# Patient Record
Sex: Female | Born: 1955 | Race: White | Hispanic: No | Smoking: Never smoker
Health system: Southern US, Community
[De-identification: ages and names within clinical notes are randomized; demographics above are authoritative.]

## PROBLEM LIST (undated history)

## (undated) DIAGNOSIS — I10 Essential (primary) hypertension: Secondary | ICD-10-CM

## (undated) DIAGNOSIS — F419 Anxiety disorder, unspecified: Secondary | ICD-10-CM

## (undated) DIAGNOSIS — I639 Cerebral infarction, unspecified: Secondary | ICD-10-CM

## (undated) DIAGNOSIS — J45909 Unspecified asthma, uncomplicated: Secondary | ICD-10-CM

## (undated) DIAGNOSIS — K219 Gastro-esophageal reflux disease without esophagitis: Secondary | ICD-10-CM

## (undated) DIAGNOSIS — E785 Hyperlipidemia, unspecified: Secondary | ICD-10-CM

## (undated) HISTORY — DX: Cerebral infarction, unspecified: I63.9

## (undated) HISTORY — DX: Gastro-esophageal reflux disease without esophagitis: K21.9

## (undated) HISTORY — DX: Hyperlipidemia, unspecified: E78.5

## (undated) HISTORY — DX: Essential (primary) hypertension: I10

## (undated) HISTORY — PX: TONSILLECTOMY AND ADENOIDECTOMY: SUR1326

---

## 2000-03-09 ENCOUNTER — Ambulatory Visit (HOSPITAL_COMMUNITY): Admission: RE | Admit: 2000-03-09 | Discharge: 2000-03-09 | Payer: Self-pay | Admitting: Gastroenterology

## 2000-03-09 ENCOUNTER — Encounter: Payer: Self-pay | Admitting: Gastroenterology

## 2003-11-21 ENCOUNTER — Ambulatory Visit: Payer: Self-pay | Admitting: Internal Medicine

## 2004-01-05 ENCOUNTER — Emergency Department: Payer: Self-pay | Admitting: Emergency Medicine

## 2004-01-23 ENCOUNTER — Ambulatory Visit: Payer: Self-pay | Admitting: Family Medicine

## 2004-08-01 ENCOUNTER — Ambulatory Visit: Payer: Self-pay | Admitting: Internal Medicine

## 2004-09-09 ENCOUNTER — Ambulatory Visit: Payer: Self-pay | Admitting: Family Medicine

## 2004-09-20 ENCOUNTER — Emergency Department: Payer: Self-pay | Admitting: Unknown Physician Specialty

## 2004-12-18 ENCOUNTER — Ambulatory Visit: Payer: Self-pay | Admitting: Internal Medicine

## 2005-04-01 ENCOUNTER — Emergency Department: Payer: Self-pay | Admitting: Emergency Medicine

## 2005-04-01 ENCOUNTER — Ambulatory Visit: Payer: Self-pay | Admitting: Internal Medicine

## 2005-04-01 ENCOUNTER — Other Ambulatory Visit: Payer: Self-pay

## 2005-04-09 ENCOUNTER — Ambulatory Visit: Payer: Self-pay | Admitting: General Surgery

## 2005-04-22 ENCOUNTER — Ambulatory Visit: Payer: Self-pay | Admitting: General Surgery

## 2005-04-30 ENCOUNTER — Ambulatory Visit: Payer: Self-pay | Admitting: Internal Medicine

## 2005-05-06 ENCOUNTER — Encounter: Payer: Self-pay | Admitting: Internal Medicine

## 2005-05-06 ENCOUNTER — Ambulatory Visit: Payer: Self-pay | Admitting: Gastroenterology

## 2005-05-10 ENCOUNTER — Ambulatory Visit: Payer: Self-pay | Admitting: Internal Medicine

## 2005-05-16 HISTORY — PX: ESOPHAGOGASTRODUODENOSCOPY: SHX1529

## 2005-05-17 ENCOUNTER — Ambulatory Visit: Payer: Self-pay | Admitting: Gastroenterology

## 2005-06-15 ENCOUNTER — Ambulatory Visit: Payer: Self-pay | Admitting: Gastroenterology

## 2005-08-27 ENCOUNTER — Ambulatory Visit: Payer: Self-pay | Admitting: Family Medicine

## 2005-09-27 ENCOUNTER — Ambulatory Visit: Payer: Self-pay | Admitting: Family Medicine

## 2006-03-10 ENCOUNTER — Ambulatory Visit: Payer: Self-pay | Admitting: Internal Medicine

## 2006-03-18 DIAGNOSIS — I639 Cerebral infarction, unspecified: Secondary | ICD-10-CM

## 2006-03-18 HISTORY — DX: Cerebral infarction, unspecified: I63.9

## 2006-03-30 ENCOUNTER — Ambulatory Visit: Payer: Self-pay | Admitting: Internal Medicine

## 2006-04-05 ENCOUNTER — Ambulatory Visit: Payer: Self-pay | Admitting: Ophthalmology

## 2006-04-06 ENCOUNTER — Ambulatory Visit: Payer: Self-pay | Admitting: Internal Medicine

## 2006-04-07 ENCOUNTER — Encounter: Payer: Self-pay | Admitting: Cardiology

## 2006-04-07 ENCOUNTER — Ambulatory Visit: Payer: Self-pay

## 2006-04-11 ENCOUNTER — Ambulatory Visit: Payer: Self-pay | Admitting: Internal Medicine

## 2006-05-18 ENCOUNTER — Ambulatory Visit: Payer: Self-pay | Admitting: Internal Medicine

## 2006-05-26 ENCOUNTER — Ambulatory Visit: Payer: Self-pay | Admitting: Internal Medicine

## 2006-06-17 DIAGNOSIS — K219 Gastro-esophageal reflux disease without esophagitis: Secondary | ICD-10-CM

## 2006-06-17 DIAGNOSIS — E7800 Pure hypercholesterolemia, unspecified: Secondary | ICD-10-CM | POA: Insufficient documentation

## 2006-06-17 DIAGNOSIS — E1165 Type 2 diabetes mellitus with hyperglycemia: Secondary | ICD-10-CM

## 2006-06-30 ENCOUNTER — Encounter (INDEPENDENT_AMBULATORY_CARE_PROVIDER_SITE_OTHER): Payer: Self-pay | Admitting: *Deleted

## 2006-08-10 ENCOUNTER — Encounter (INDEPENDENT_AMBULATORY_CARE_PROVIDER_SITE_OTHER): Payer: Self-pay | Admitting: *Deleted

## 2006-08-10 ENCOUNTER — Ambulatory Visit: Payer: Self-pay | Admitting: Internal Medicine

## 2006-08-10 DIAGNOSIS — I10 Essential (primary) hypertension: Secondary | ICD-10-CM | POA: Insufficient documentation

## 2006-08-11 ENCOUNTER — Encounter: Payer: Self-pay | Admitting: Internal Medicine

## 2006-08-22 ENCOUNTER — Ambulatory Visit: Payer: Self-pay

## 2006-08-26 ENCOUNTER — Encounter: Payer: Self-pay | Admitting: Internal Medicine

## 2006-08-26 ENCOUNTER — Ambulatory Visit: Payer: Self-pay

## 2006-08-31 ENCOUNTER — Encounter: Payer: Self-pay | Admitting: Internal Medicine

## 2006-11-15 ENCOUNTER — Telehealth (INDEPENDENT_AMBULATORY_CARE_PROVIDER_SITE_OTHER): Payer: Self-pay | Admitting: *Deleted

## 2006-11-16 ENCOUNTER — Ambulatory Visit: Payer: Self-pay | Admitting: Internal Medicine

## 2006-12-01 ENCOUNTER — Ambulatory Visit: Payer: Self-pay | Admitting: Internal Medicine

## 2006-12-01 LAB — CONVERTED CEMR LAB
Bilirubin Urine: NEGATIVE
Blood in Urine, dipstick: NEGATIVE
Glucose, Urine, Semiquant: NEGATIVE
Ketones, urine, test strip: NEGATIVE
Nitrite: NEGATIVE
Protein, U semiquant: NEGATIVE
Specific Gravity, Urine: 1.015
Urobilinogen, UA: 0.2
WBC Urine, dipstick: NEGATIVE
pH: 5

## 2006-12-23 ENCOUNTER — Encounter (INDEPENDENT_AMBULATORY_CARE_PROVIDER_SITE_OTHER): Payer: Self-pay | Admitting: *Deleted

## 2007-01-02 ENCOUNTER — Ambulatory Visit: Payer: Self-pay | Admitting: Family Medicine

## 2007-01-03 ENCOUNTER — Encounter: Payer: Self-pay | Admitting: Internal Medicine

## 2007-01-04 ENCOUNTER — Emergency Department: Payer: Self-pay | Admitting: Emergency Medicine

## 2007-01-04 ENCOUNTER — Other Ambulatory Visit: Payer: Self-pay

## 2007-01-10 ENCOUNTER — Encounter: Payer: Self-pay | Admitting: Internal Medicine

## 2007-01-13 ENCOUNTER — Telehealth (INDEPENDENT_AMBULATORY_CARE_PROVIDER_SITE_OTHER): Payer: Self-pay | Admitting: *Deleted

## 2007-01-16 ENCOUNTER — Ambulatory Visit: Payer: Self-pay

## 2007-01-18 ENCOUNTER — Encounter (INDEPENDENT_AMBULATORY_CARE_PROVIDER_SITE_OTHER): Payer: Self-pay | Admitting: Neurology

## 2007-01-18 ENCOUNTER — Ambulatory Visit: Payer: Self-pay

## 2007-01-23 ENCOUNTER — Ambulatory Visit: Payer: Self-pay | Admitting: Internal Medicine

## 2007-01-24 ENCOUNTER — Telehealth (INDEPENDENT_AMBULATORY_CARE_PROVIDER_SITE_OTHER): Payer: Self-pay | Admitting: *Deleted

## 2007-02-12 ENCOUNTER — Encounter: Admission: RE | Admit: 2007-02-12 | Discharge: 2007-02-12 | Payer: Self-pay | Admitting: Neurology

## 2007-02-27 ENCOUNTER — Encounter: Payer: Self-pay | Admitting: Internal Medicine

## 2007-03-03 ENCOUNTER — Encounter: Payer: Self-pay | Admitting: Cardiology

## 2007-03-03 ENCOUNTER — Ambulatory Visit (HOSPITAL_COMMUNITY): Admission: RE | Admit: 2007-03-03 | Discharge: 2007-03-03 | Payer: Self-pay | Admitting: Cardiology

## 2007-03-03 ENCOUNTER — Ambulatory Visit: Payer: Self-pay | Admitting: Cardiology

## 2007-03-04 ENCOUNTER — Encounter: Payer: Self-pay | Admitting: Internal Medicine

## 2007-03-24 ENCOUNTER — Ambulatory Visit: Payer: Self-pay | Admitting: Internal Medicine

## 2007-04-25 ENCOUNTER — Encounter (INDEPENDENT_AMBULATORY_CARE_PROVIDER_SITE_OTHER): Payer: Self-pay | Admitting: *Deleted

## 2007-04-25 ENCOUNTER — Telehealth (INDEPENDENT_AMBULATORY_CARE_PROVIDER_SITE_OTHER): Payer: Self-pay | Admitting: *Deleted

## 2007-06-08 ENCOUNTER — Encounter (INDEPENDENT_AMBULATORY_CARE_PROVIDER_SITE_OTHER): Payer: Self-pay | Admitting: *Deleted

## 2007-06-14 ENCOUNTER — Encounter (INDEPENDENT_AMBULATORY_CARE_PROVIDER_SITE_OTHER): Payer: Self-pay | Admitting: *Deleted

## 2007-06-15 ENCOUNTER — Ambulatory Visit: Payer: Self-pay | Admitting: Family Medicine

## 2007-06-30 ENCOUNTER — Encounter (INDEPENDENT_AMBULATORY_CARE_PROVIDER_SITE_OTHER): Payer: Self-pay | Admitting: *Deleted

## 2007-07-15 ENCOUNTER — Emergency Department: Payer: Self-pay | Admitting: Emergency Medicine

## 2007-07-17 ENCOUNTER — Ambulatory Visit: Payer: Self-pay | Admitting: Internal Medicine

## 2007-07-18 ENCOUNTER — Ambulatory Visit: Payer: Self-pay | Admitting: Internal Medicine

## 2007-07-21 ENCOUNTER — Ambulatory Visit: Payer: Self-pay | Admitting: Internal Medicine

## 2007-07-21 ENCOUNTER — Encounter (INDEPENDENT_AMBULATORY_CARE_PROVIDER_SITE_OTHER): Payer: Self-pay | Admitting: *Deleted

## 2007-07-25 ENCOUNTER — Telehealth: Payer: Self-pay | Admitting: Internal Medicine

## 2007-09-08 ENCOUNTER — Emergency Department: Payer: Self-pay | Admitting: Emergency Medicine

## 2007-09-15 ENCOUNTER — Ambulatory Visit: Payer: Self-pay | Admitting: Internal Medicine

## 2007-09-15 DIAGNOSIS — F39 Unspecified mood [affective] disorder: Secondary | ICD-10-CM | POA: Insufficient documentation

## 2007-09-20 ENCOUNTER — Encounter: Payer: Self-pay | Admitting: Internal Medicine

## 2007-09-20 ENCOUNTER — Ambulatory Visit: Payer: Self-pay | Admitting: Specialist

## 2007-09-22 ENCOUNTER — Encounter: Payer: Self-pay | Admitting: Internal Medicine

## 2008-04-19 ENCOUNTER — Emergency Department: Payer: Self-pay | Admitting: Emergency Medicine

## 2008-04-19 ENCOUNTER — Encounter (INDEPENDENT_AMBULATORY_CARE_PROVIDER_SITE_OTHER): Payer: Self-pay | Admitting: Internal Medicine

## 2008-04-25 ENCOUNTER — Encounter (INDEPENDENT_AMBULATORY_CARE_PROVIDER_SITE_OTHER): Payer: Self-pay | Admitting: Internal Medicine

## 2008-04-25 ENCOUNTER — Ambulatory Visit: Payer: Self-pay | Admitting: Family Medicine

## 2008-04-25 DIAGNOSIS — F4321 Adjustment disorder with depressed mood: Secondary | ICD-10-CM

## 2008-04-25 LAB — CONVERTED CEMR LAB
Bilirubin Urine: NEGATIVE
Blood in Urine, dipstick: NEGATIVE
Glucose, Urine, Semiquant: NEGATIVE
Ketones, urine, test strip: NEGATIVE
Nitrite: NEGATIVE
Protein, U semiquant: NEGATIVE
Specific Gravity, Urine: 1.01
Urobilinogen, UA: 0.2
WBC Urine, dipstick: NEGATIVE
pH: 6

## 2008-05-05 ENCOUNTER — Encounter (INDEPENDENT_AMBULATORY_CARE_PROVIDER_SITE_OTHER): Payer: Self-pay | Admitting: Internal Medicine

## 2008-05-05 LAB — CONVERTED CEMR LAB
Bacteria, UA: 0
RBC / HPF: 0

## 2008-07-05 ENCOUNTER — Telehealth: Payer: Self-pay | Admitting: Family Medicine

## 2008-07-29 ENCOUNTER — Encounter: Payer: Self-pay | Admitting: Internal Medicine

## 2008-08-15 ENCOUNTER — Ambulatory Visit: Payer: Self-pay | Admitting: Family Medicine

## 2008-08-15 DIAGNOSIS — M76899 Other specified enthesopathies of unspecified lower limb, excluding foot: Secondary | ICD-10-CM

## 2008-09-10 ENCOUNTER — Ambulatory Visit: Payer: Self-pay | Admitting: Internal Medicine

## 2008-09-10 DIAGNOSIS — N951 Menopausal and female climacteric states: Secondary | ICD-10-CM | POA: Insufficient documentation

## 2008-09-26 ENCOUNTER — Ambulatory Visit: Payer: Self-pay | Admitting: Family Medicine

## 2008-10-10 ENCOUNTER — Ambulatory Visit: Payer: Self-pay | Admitting: Family Medicine

## 2008-11-01 ENCOUNTER — Emergency Department: Payer: Self-pay | Admitting: Emergency Medicine

## 2008-11-01 ENCOUNTER — Telehealth: Payer: Self-pay | Admitting: Internal Medicine

## 2008-11-18 ENCOUNTER — Ambulatory Visit: Payer: Self-pay | Admitting: Internal Medicine

## 2008-12-30 ENCOUNTER — Telehealth: Payer: Self-pay | Admitting: Internal Medicine

## 2009-01-07 ENCOUNTER — Telehealth: Payer: Self-pay | Admitting: Internal Medicine

## 2009-08-02 ENCOUNTER — Encounter: Payer: Self-pay | Admitting: Internal Medicine

## 2010-02-04 ENCOUNTER — Encounter: Payer: Self-pay | Admitting: Internal Medicine

## 2010-02-05 ENCOUNTER — Ambulatory Visit: Payer: Self-pay | Admitting: Internal Medicine

## 2010-02-05 DIAGNOSIS — N3000 Acute cystitis without hematuria: Secondary | ICD-10-CM | POA: Insufficient documentation

## 2010-02-05 LAB — CONVERTED CEMR LAB
Glucose, Urine, Semiquant: NEGATIVE
Nitrite: NEGATIVE
Protein, U semiquant: NEGATIVE
WBC Urine, dipstick: NEGATIVE
pH: 6.5

## 2010-03-17 NOTE — Letter (Signed)
Summary: [facility]  [facility]   Imported By: Lanelle Bal 08/20/2009 11:37:09  _____________________________________________________________________  External Attachment:    Type:   Image     Comment:   External Document  Appended Document: [facility] seen for wrist and elbow pain felt to be acute inflammatory process trying sterapred pack

## 2010-03-19 NOTE — Assessment & Plan Note (Signed)
Summary: 1:45 ?UTI/CLE   Vital Signs:  Patient profile:   55 year old female Height:      63 inches Weight:      242 pounds BMI:     43.02 Temp:     98.4 degrees F oral BP sitting:   158 / 90  (right arm) Cuff size:   large  Vitals Entered By: Mervin Hack CMA Duncan Dull) (February 05, 2010 1:54 PM) CC: ? UTI   History of Present Illness: Has had a lot of stress Husband has been in and out of the hospital a lot hasn't been able to get in due to caring for him  Now having some pain and burning in left upper back--and around to flank soreness over past couple of days No dysuria but has increased frequency No hematuria No fever but doesn't feel good. SOme cold sweat Mild abd cramp but not major  Diabetes has been okay checks about twice a week fasting. usually 120-130 No hypoglycemic reactions but can tell if it is a little low if she has missed a meal Overdue for eye appt  SOme focusing problems--relates to strokes Occ headaches---usually left parietal--more common if tired No chest pain No SOB  Ongoing mood issues medication wasn't helping off sertraline for 5-6 months  Allergies: 1)  ! Tramadol Hcl (Tramadol Hcl) 2)  Augmentin 3)  Singulair (Montelukast Sodium) 4)  Doxycycline Hyclate (Doxycycline Hyclate) 5)  Macrobid (Nitrofurantoin Monohyd Macro)  Past History:  Past medical, surgical, family and social histories (including risk factors) reviewed for relevance to current acute and chronic problems.  Past Medical History: Reviewed history from 03/24/2007 and no changes required. Diabetes mellitus, type II-------Dr [facility] E[facility]   [id number] GERD Hyperlipidemia 2/08  CVA--R occipital----Dr Thad Ranger HTN   CONSULTANTS Dr Luella Cook [phone]  Past Surgical History: Reviewed history from 06/17/2006 and no changes required. C-section x 1 T & A EGD- GERD 04/07  Family History: Reviewed history and no changes required.  Social History: Reviewed  history from 06/17/2006 and no changes required. 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   Neck:  supple, no masses, no thyromegaly, and no cervical lymphadenopathy.   Lungs:  normal respiratory effort, no intercostal retractions, no accessory muscle use, and normal breath sounds.   Heart:  normal rate, regular rhythm, no murmur, and no gallop.   Abdomen:  soft.  Mild tenderness around LMQ Skin:  no suspicious lesions and no ulcerations.   Psych:  normally interactive, good eye contact, and dysphoric affect.    Diabetes Management Exam:    Foot Exam (with socks and/or shoes not present):       Sensory-Pinprick/Light touch:          Left medial foot (L-4): diminished          Left dorsal foot (L-5): diminished          Left lateral foot (S-1): diminished          Right medial foot (L-4): normal          Right dorsal foot (L-5): normal          Right lateral foot (S-1): normal       Inspection:  Left foot: normal          Right foot: normal       Nails:          Left foot: normal          Right foot: normal   Impression & Recommendations:  Problem # 1:  ACUTE CYSTITIS (ICD-595.0) Assessment New  not clear cut will try cipro for 3 days  Her updated medication list for this problem includes:    Ciprofloxacin Hcl 250 Mg Tabs (Ciprofloxacin hcl) .Marland Kitchen... 1 tab by mouth two times a day for bladder infection  Orders: UA Dipstick w/o Micro (manual) ([address])  Problem # 2:  DEPRESSION, SITUATIONAL (ICD-309.0) Assessment: Deteriorated ongoing didn't get help from sertraline will try fluoxetine again---helped but gained weight  Problem # 3:  DIABETES MELLITUS, TYPE II (ICD-250.00) Assessment:  Unchanged  seems like she has good control will check labs   Her updated medication list for this problem includes:    Lisinopril-hydrochlorothiazide 20-25 Mg Tabs (Lisinopril-hydrochlorothiazide) .Marland Kitchen... Take 1 tablet by mouth once a day    Glipizide Xl 5 Mg Tb24 (Glipizide) .Marland Kitchen... 1 daily    Aspirin 81 Mg Tbec (Aspirin) .Marland Kitchen... Take one by mouth daily  Orders: TLB-A1C / Hgb A1C (Glycohemoglobin) (83036-A1C) TLB-TSH (Thyroid Stimulating Hormone) (84443-TSH) Venipuncture ([address])  Problem # 4:  ESSENTIAL HYPERTENSION (ICD-401.9) Assessment: Unchanged  up some today but doesn't feel well  Her updated medication list for this problem includes:    Toprol Xl 50 Mg Tb24 (Metoprolol succinate) .Marland Kitchen... Take 1 tablet by mouth once a day    Lisinopril-hydrochlorothiazide 20-25 Mg Tabs (Lisinopril-hydrochlorothiazide) .Marland Kitchen... Take 1 tablet by mouth once a day  BP today: 158/90 Prior BP: 120/80 (10/10/2008)  Orders: TLB-Renal Function Panel (80069-RENAL) TLB-CBC Platelet - w/Differential (85025-CBCD)  Complete Medication List: 1)  Toprol Xl 50 Mg Tb24 (Metoprolol succinate) .... Take 1 tablet by mouth once a day 2)  Pravastatin Sodium 40 Mg Tabs (Pravastatin sodium) .... Take 1 tablet by mouth at bedtime 3)  Lisinopril-hydrochlorothiazide 20-25 Mg Tabs (Lisinopril-hydrochlorothiazide) .... Take 1 tablet by mouth once a day 4)  Xopenex Hfa 45 Mcg/act Aero (Levalbuterol tartrate) .... 2 puffs every 4 hours as needed wheezing or chest tightness. 5)  Glipizide Xl 5 Mg Tb24 (Glipizide) .Marland Kitchen.. 1 daily 6)  Aspirin 81 Mg Tbec (Aspirin) .... Take one by mouth daily 7)  Ciprofloxacin Hcl 250 Mg Tabs (Ciprofloxacin hcl) .Marland Kitchen.. 1 tab by mouth two times a day for bladder infection 8)  Fluoxetine Hcl 10 Mg Caps (Fluoxetine hcl) .Marland Kitchen.. 1 cap by mouth daily to help mood and energy  Other Orders: TLB-Hepatic/Liver Function Pnl (80076-HEPATIC) TLB-Lipid Panel (80061-LIPID)  Patient Instructions: 1)  Please  schedule a follow-up appointment in 3 months for physical Prescriptions: GLIPIZIDE XL 5 MG  TB24 (GLIPIZIDE) 1 daily  #90 x 1   Entered and Authorized by:   Cindee Salt MD   Signed by:   Cindee Salt MD on 02/05/2010   Method used:   Electronically to        [facility] [address] (retail)       [address], [address]       [place], [place]  [address]       Ph: [phone]       Fax: [phone]   RxID:   [id number] LISINOPRIL-HYDROCHLOROTHIAZIDE 20-25 MG TABS (LISINOPRIL-HYDROCHLOROTHIAZIDE) Take 1 tablet by mouth once a day  #90 x  1   Entered and Authorized by:   Cindee Salt MD   Signed by:   Cindee Salt MD on 02/05/2010   Method used:   Electronically to        [facility] [address] (retail)       [address], [address]       [place], [place]  [address]       Ph: [phone]       Fax: [phone]   RxID:   [id number] PRAVASTATIN SODIUM 40 MG TABS (PRAVASTATIN SODIUM) Take 1 tablet by mouth at bedtime  #90 x 1   Entered and Authorized by:   Cindee Salt MD   Signed by:   Cindee Salt MD on 02/05/2010   Method used:   Electronically to        [facility] [address] (retail)       [address], [address]       [place], [place]  [address]       Ph: [phone]       Fax: [phone]   RxID:   [id number] TOPROL XL 50 MG TB24 (METOPROLOL SUCCINATE) Take 1 tablet by mouth once a day  #90 x 1   Entered and Authorized by:   Cindee Salt MD   Signed by:   Cindee Salt MD on 02/05/2010   Method used:   Electronically to        [facility] [address] (retail)       [address], [address]       [place], [place]  [address]       Ph: [phone]       Fax: [phone]   RxID:   [id number] FLUOXETINE HCL 10 MG CAPS (FLUOXETINE HCL) 1 cap by mouth daily to help mood and energy  #90 x 1    Entered and Authorized by:   Cindee Salt MD   Signed by:   Cindee Salt MD on 02/05/2010   Method used:   Electronically to        [facility] [address] (retail)       [address], [address]       [place], [place]  [address]       Ph: [phone]       Fax: [phone]   RxID:   [id number] CIPROFLOXACIN HCL 250 MG TABS (CIPROFLOXACIN HCL) 1 tab by mouth two times a day for bladder infection  #14 x 0   Entered and Authorized by:   Cindee Salt MD   Signed by:   Cindee Salt MD on 02/05/2010   Method used:   Electronically to        [facility] [address] (retail)       [address], [address]       [place], [place]  [address]       Ph: [phone]       Fax: [phone]   RxID:   [id number]    Orders Added: 1)  Est. Patient Level IV [[address]] 2)  UA Dipstick w/o Micro (manual) [[address]1002] 3)  TLB-A1C / Hgb A1C (Glycohemoglobin) [83036-A1C] 4)  TLB-TSH (Thyroid Stimulating  Hormone) [84443-TSH] 5)  Venipuncture [36415] 6)  TLB-Renal Function Panel [80069-RENAL] 7)  TLB-CBC Platelet - w/Differential [85025-CBCD] 8)  TLB-Hepatic/Liver Function Pnl [80076-HEPATIC] 9)  TLB-Lipid Panel [80061-LIPID]    Current Allergies (reviewed today): ! TRAMADOL HCL (TRAMADOL HCL) AUGMENTIN SINGULAIR (MONTELUKAST SODIUM) DOXYCYCLINE HYCLATE (DOXYCYCLINE HYCLATE) MACROBID (NITROFURANTOIN MONOHYD MACRO)  Laboratory Results   Urine Tests  Date/Time Received: February 05, 2010 1:55 PM   Routine Urinalysis   Color: yellow Appearance: Clear Glucose: negative   (Normal Range: Negative) Bilirubin: negative   (Normal Range: Negative) Ketone: negative   (Normal Range: Negative) Spec. Gravity: 1.020   (Normal Range: 1.003-1.035) Blood: negative   (Normal Range: Negative) pH: 6.5   (Normal Range: 5.0-8.0) Protein: negative   (Normal Range: Negative) Urobilinogen: 0.2   (Normal Range: 0-1) Nitrite:  negative   (Normal Range: Negative) Leukocyte Esterace: negative   (Normal Range: Negative)

## 2010-03-21 ENCOUNTER — Encounter: Payer: Self-pay | Admitting: Internal Medicine

## 2010-04-06 ENCOUNTER — Encounter: Payer: Self-pay | Admitting: Family Medicine

## 2010-04-06 ENCOUNTER — Ambulatory Visit (INDEPENDENT_AMBULATORY_CARE_PROVIDER_SITE_OTHER)
Admission: RE | Admit: 2010-04-06 | Discharge: 2010-04-06 | Disposition: A | Discharge status: Home / Self Care | Payer: 59 | Source: Ambulatory Visit | Attending: Family Medicine | Admitting: Family Medicine

## 2010-04-06 ENCOUNTER — Other Ambulatory Visit: Payer: Self-pay | Admitting: Family Medicine

## 2010-04-06 ENCOUNTER — Ambulatory Visit (INDEPENDENT_AMBULATORY_CARE_PROVIDER_SITE_OTHER): Payer: 59 | Admitting: Family Medicine

## 2010-04-06 DIAGNOSIS — M25539 Pain in unspecified wrist: Secondary | ICD-10-CM

## 2010-04-06 DIAGNOSIS — S63659A Sprain of metacarpophalangeal joint of unspecified finger, initial encounter: Secondary | ICD-10-CM | POA: Insufficient documentation

## 2010-04-14 ENCOUNTER — Encounter: Payer: Self-pay | Admitting: Family Medicine
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
## 2010-05-06 ENCOUNTER — Ambulatory Visit: Payer: 59 | Admitting: Family Medicine

## 2010-05-06 DIAGNOSIS — Z0289 Encounter for other administrative examinations: Secondary | ICD-10-CM

## 2010-05-07 ENCOUNTER — Other Ambulatory Visit (HOSPITAL_COMMUNITY)
Admission: RE | Admit: 2010-05-07 | Discharge: 2010-05-07 | Disposition: A | Discharge status: Home / Self Care | Payer: 59 | Source: Ambulatory Visit | Attending: Internal Medicine | Admitting: Internal Medicine

## 2010-05-07 DIAGNOSIS — Z01419 Encounter for gynecological examination (general) (routine) without abnormal findings: Secondary | ICD-10-CM | POA: Insufficient documentation

## 2010-05-08 ENCOUNTER — Encounter: Payer: Self-pay | Admitting: Internal Medicine

## 2010-05-08 ENCOUNTER — Encounter (INDEPENDENT_AMBULATORY_CARE_PROVIDER_SITE_OTHER): Payer: Self-pay | Admitting: *Deleted

## 2010-05-08 ENCOUNTER — Ambulatory Visit (INDEPENDENT_AMBULATORY_CARE_PROVIDER_SITE_OTHER): Payer: 59 | Admitting: Internal Medicine

## 2010-05-08 VITALS — BP 144/86 | HR 86 | Temp 98.5°F | Ht 63.0 in | Wt 243.0 lb

## 2010-05-08 DIAGNOSIS — N950 Postmenopausal bleeding: Secondary | ICD-10-CM | POA: Insufficient documentation

## 2010-05-08 DIAGNOSIS — E119 Type 2 diabetes mellitus without complications: Secondary | ICD-10-CM

## 2010-05-08 DIAGNOSIS — Z1211 Encounter for screening for malignant neoplasm of colon: Secondary | ICD-10-CM

## 2010-05-08 DIAGNOSIS — Z1231 Encounter for screening mammogram for malignant neoplasm of breast: Secondary | ICD-10-CM

## 2010-05-08 DIAGNOSIS — Z0000 Encounter for general adult medical examination without abnormal findings: Secondary | ICD-10-CM | POA: Insufficient documentation

## 2010-05-08 DIAGNOSIS — F4321 Adjustment disorder with depressed mood: Secondary | ICD-10-CM

## 2010-05-08 DIAGNOSIS — M1610 Unilateral primary osteoarthritis, unspecified hip: Secondary | ICD-10-CM | POA: Insufficient documentation

## 2010-05-08 MED ORDER — FLUOXETINE HCL 20 MG PO CAPS: 20.0000 mg | ORAL_CAPSULE | Freq: Every day | ORAL | Status: DC

## 2010-05-08 MED ORDER — HYDROCODONE-ACETAMINOPHEN 5-325 MG PO TABS: 1.0000 | ORAL_TABLET | Freq: Every evening | ORAL | Status: DC | PRN
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
## 2010-05-08 NOTE — Progress Notes (Signed)
Addended byMills Koller on: 05/08/2010 04:38 PM   Modules accepted: Orders

## 2010-05-08 NOTE — Patient Instructions (Addendum)
Please try ice on your left hip when it is hurting Please call if you have any more vaginal bleeding Please set up your mammogram and colonoscopy appointments  Please get labs to [facility] today HgbA1c and urine microal--250.00 Lipid and hepatic    272.4 CBC with diff, BMP, TSH  401.9

## 2010-05-13 ENCOUNTER — Encounter: Payer: Self-pay | Admitting: *Deleted

## 2010-05-14 NOTE — Letter (Signed)
Summary: Pre Visit Letter Revised  [facility]  [address] [place], [place] [address]   Phone: [phone]  Fax: [phone]        05/08/2010 MRN: [id number] [facility] [address] [place], [place]  [address]  [place]             Procedure Date:  06/24/2010 @ 8:30   Direct colon-Dr. Jarold Motto   Welcome to the Gastroenterology Division at [facility].    You are scheduled to see a nurse for your pre-procedure visit on 06/10/2010 at 8:00 on the 3rd floor at [facility], [address]  We ask that you try to arrive at our office 15 minutes prior to your appointment time to allow for check-in.  Please take a minute to review the attached form.  If you answer "Yes" to one or more of the questions on the first page, we ask that you call the person listed at your earliest opportunity.  If you answer "No" to all of the questions, please complete the rest of the form and bring it to your appointment.    Your nurse visit will consist of discussing your medical and surgical history, your immediate family medical history, and your medications.   If you are unable to list all of your medications on the form, please bring the medication bottles to your appointment and we will list them.  We will need to be aware of both prescribed and over the counter drugs.  We will need to know exact dosage information as well.    Please be prepared to read and sign documents such as consent forms, a financial agreement, and acknowledgement forms.  If necessary, and with your consent, a friend or relative is welcome to sit-in on the nurse visit with you.  Please bring your insurance card so that we may make a copy of it.  If your insurance requires a referral to see a specialist, please bring your referral form from your primary care physician.  No co-pay is required for this nurse visit.     If you cannot keep your appointment, please call [phone] to cancel or reschedule prior to  your appointment date.  This allows [place] the opportunity to schedule an appointment for another patient in need of care.    Thank you for choosing [facility] for your medical needs.  We appreciate the opportunity to care for you.  Please visit [place] at our website  to learn more about our practice.  Sincerely, The Gastroenterology Division

## 2010-05-15 ENCOUNTER — Telehealth: Payer: Self-pay | Admitting: *Deleted

## 2010-05-15 NOTE — Telephone Encounter (Signed)
labwork came back faxed to [place] from [facility], Per Dr.Letvak results are as follows:  blood work ok Diabetes control better with A1C down to 7.2% Chol better with total 232 and LDL or bad chol down to 153 Blood count, liver, kidney and thyroid all normal  results have been put on phone tree, copied and sent for scanning.  Tami Jones spoke with patient and advised results.

## 2010-06-01 ENCOUNTER — Ambulatory Visit: Payer: Self-pay | Admitting: Internal Medicine

## 2010-06-03 ENCOUNTER — Encounter: Payer: Self-pay | Admitting: *Deleted

## 2010-06-08 ENCOUNTER — Encounter: Payer: Self-pay | Admitting: Internal Medicine

## 2010-06-24 ENCOUNTER — Other Ambulatory Visit: Payer: 59 | Admitting: Gastroenterology

## 2010-07-02 ENCOUNTER — Telehealth: Payer: Self-pay | Admitting: *Deleted

## 2010-07-02 NOTE — Telephone Encounter (Signed)
Check with her tomorrow This is unusual since it tends to be activating I don't mind switching back to 2 - 10mg  but this is not as easy Okay to try at night though

## 2010-07-02 NOTE — Telephone Encounter (Signed)
Pt had been taking prozac 10 mg's, two at a time.  She picked up a new script and it was for 20 mg's.  She took one today and is extremely sleepy.  I advised pt that she probably has a different brand of generic

## 2010-07-02 NOTE — Telephone Encounter (Signed)
Note continued from below.  I advised pt to try taking this at night, it may take some time for her system to get used to it.  She will call back if she continues to have problems.

## 2010-07-03 NOTE — Telephone Encounter (Signed)
Spoke with patient and she will try it at night and call us back on Monday to let us know how it worked.

## 2010-07-17 ENCOUNTER — Ambulatory Visit: Payer: 59 | Admitting: Internal Medicine

## 2010-07-17 DIAGNOSIS — Z0289 Encounter for other administrative examinations: Secondary | ICD-10-CM

## 2010-08-12 ENCOUNTER — Encounter: Payer: Self-pay | Admitting: Family Medicine

## 2010-08-12 ENCOUNTER — Ambulatory Visit (INDEPENDENT_AMBULATORY_CARE_PROVIDER_SITE_OTHER): Payer: 59 | Admitting: Family Medicine

## 2010-08-12 VITALS — BP 130/82 | HR 70 | Temp 97.8°F | Ht 64.0 in | Wt 240.8 lb

## 2010-08-12 DIAGNOSIS — N10 Acute tubulo-interstitial nephritis: Secondary | ICD-10-CM | POA: Insufficient documentation

## 2010-08-12 DIAGNOSIS — R300 Dysuria: Secondary | ICD-10-CM

## 2010-08-12 LAB — POCT URINALYSIS DIPSTICK
Glucose, UA: NEGATIVE
Nitrite, UA: NEGATIVE
Spec Grav, UA: 1.02
Urobilinogen, UA: NEGATIVE

## 2010-08-12 MED ORDER — CIPROFLOXACIN HCL 500 MG PO TABS: 500.0000 mg | ORAL_TABLET | Freq: Two times a day (BID) | ORAL | Status: DC

## 2010-08-12 NOTE — Progress Notes (Signed)
Tami Jones, a 55 y.o. female presents today in the office for the following:    ? UTI  Pain in left kidney Going to bathroom more frequently Son got married in [place]  Pain in back for a couple of days Nausea  Patient presents with burning, urgency. No vaginal discharge or external irritation.  No STD exposure.  Patient Active Problem List  Diagnoses  . DIABETES MELLITUS, TYPE II  . HYPERCHOLESTEROLEMIA  . ANXIETY, SITUATIONAL  . DEPRESSION, SITUATIONAL  . ESSENTIAL HYPERTENSION  . GERD  . MENOPAUSAL SYNDROME  . TROCHANTERIC BURSITIS, LEFT  . ACUTE CYSTITIS  . Physical exam, annual  . Postmenopausal bleeding  . Arthritis, hip  . Pyelonephritis, acute   Past Medical History  Diagnosis Date  . Diabetes mellitus   . GERD (gastroesophageal reflux disease)   . Hyperlipidemia   . CVA (cerebral vascular accident) 03/2006    right occipital, Dr. Thad Ranger  . Hypertension    Past Surgical History  Procedure Date  . Cesarean section   . Tonsillectomy and adenoidectomy   . Esophagogastroduodenoscopy 05/2005   History  Substance Use Topics  . Smoking status: Never Smoker   . Smokeless tobacco: Never Used  . Alcohol Use: No   No family history on file. Allergies  Allergen Reactions  . Doxycycline Hyclate     REACTION: stomach upset  . HKV:[id number]+EPPIRJJOA+[id number] Acid+Aspartame     REACTION: diarrrhea and nausea  . Montelukast Sodium     REACTION: unspecified  . Nitrofurantoin     REACTION: unspecified  . Sulfa Antibiotics Nausea Only  . Tramadol Hcl     REACTION: Can not tolerate   Current Outpatient Prescriptions on File Prior to Visit  Medication Sig Dispense Refill  . aspirin 81 MG tablet Take 81 mg by mouth daily.        Marland Kitchen FLUoxetine (PROZAC) 20 MG capsule Take 1 capsule (20 mg total) by mouth daily.  30 capsule  3  . glipiZIDE (GLUCOTROL) 5 MG 24 hr tablet Take 5 mg by mouth daily.        Marland Kitchen HYDROcodone-acetaminophen (NORCO) 5-325 MG per  tablet Take 1-2 tablets by mouth at bedtime as needed for pain.  60 tablet  0  . levalbuterol (XOPENEX HFA) 45 MCG/ACT inhaler Inhale 1-2 puffs into the lungs every 4 (four) hours as needed.        Marland Kitchen lisinopril-hydrochlorothiazide (PRINZIDE,ZESTORETIC) 20-25 MG per tablet Take 1 tablet by mouth daily.        . metoprolol (TOPROL-XL) 50 MG 24 hr tablet Take 50 mg by mouth daily.        . pravastatin (PRAVACHOL) 40 MG tablet Take 40 mg by mouth at bedtime.           ROS: GEN:  no fevers, chills. GI: nausea, decreased appetite No cp or sob Otherwise, ROS is as per the HPI.  PHYSICAL EXAM  Blood pressure 130/82, pulse 70, temperature 97.8 F (36.6 C), temperature source Oral, height 5\' 4"  (1.626 m), weight 240 lb 12.8 oz (109.226 kg), SpO2 99.00%.  GEN: WDWN, A&Ox4,NAD. Non-toxic HEENT: Atraumatc, normocephalic. CV: RRR, No M/G/R PULM: CTA B, No wheezes, crackles, or rhonchi ABD: S, NT, ND, +BS, no rebound. + L CVAT. No suprapubic tenderness. EXT: No c/c/e  A/P: Pyelo, dysuria: clinical concern for early L pyelo. Cipro x 10 days Check urine culture

## 2010-08-13 ENCOUNTER — Telehealth: Payer: Self-pay | Admitting: *Deleted

## 2010-08-13 ENCOUNTER — Encounter: Payer: Self-pay | Admitting: *Deleted

## 2010-08-13 ENCOUNTER — Encounter: Payer: Self-pay | Admitting: Family Medicine

## 2010-08-13 ENCOUNTER — Ambulatory Visit (INDEPENDENT_AMBULATORY_CARE_PROVIDER_SITE_OTHER): Payer: 59 | Admitting: Family Medicine

## 2010-08-13 ENCOUNTER — Ambulatory Visit (INDEPENDENT_AMBULATORY_CARE_PROVIDER_SITE_OTHER)
Admission: RE | Admit: 2010-08-13 | Discharge: 2010-08-13 | Disposition: A | Discharge status: Home / Self Care | Payer: 59 | Source: Ambulatory Visit | Attending: Cardiology | Admitting: Cardiology

## 2010-08-13 DIAGNOSIS — N10 Acute tubulo-interstitial nephritis: Secondary | ICD-10-CM

## 2010-08-13 DIAGNOSIS — R1013 Epigastric pain: Secondary | ICD-10-CM | POA: Insufficient documentation

## 2010-08-13 LAB — HEPATIC FUNCTION PANEL
AST: 29 U/L (ref 0–37)
Total Bilirubin: 0.6 mg/dL (ref 0.3–1.2)

## 2010-08-13 LAB — BASIC METABOLIC PANEL
BUN: 16 mg/dL (ref 6–23)
Calcium: 9.3 mg/dL (ref 8.4–10.5)
Creatinine, Ser: 1.1 mg/dL (ref 0.4–1.2)
GFR: 57.49 mL/min — ABNORMAL LOW (ref >60.00)
Potassium: 4.2 mEq/L (ref 3.5–5.1)

## 2010-08-13 LAB — CBC WITH DIFFERENTIAL/PLATELET
Basophils Relative: 0.3 % (ref 0.0–3.0)
Eosinophils Absolute: 0 10*3/uL (ref 0.0–0.7)
MCHC: 34.5 g/dL (ref 30.0–36.0)
MCV: 91.9 fl (ref 78.0–100.0)
Monocytes Absolute: 0.4 10*3/uL (ref 0.1–1.0)
Neutrophils Relative %: 55.3 % (ref 43.0–77.0)
RBC: 4.25 Mil/uL (ref 3.87–5.11)

## 2010-08-13 LAB — LIPASE: Lipase: 36 U/L (ref 11.0–59.0)

## 2010-08-13 MED ORDER — IOHEXOL 300 MG/ML  SOLN
100.0000 mL | Freq: Once | INTRAMUSCULAR | Status: AC | PRN
  Administered 2010-08-13: 100 mL via INTRAVENOUS

## 2010-08-13 MED ORDER — CEFTRIAXONE SODIUM 250 MG IJ SOLR
1000.0000 mg | Freq: Once | INTRAMUSCULAR | Status: AC
  Administered 2010-08-13: 1000 mg via INTRAMUSCULAR

## 2010-08-13 NOTE — Telephone Encounter (Signed)
Patient advised.

## 2010-08-13 NOTE — Telephone Encounter (Signed)
Pt was seen yesterday for a UTI, she says she is worse today.  Taking cipro since yesterday, has had vomiting since last night.  Chills and sweating off and on, but no known fever , has a lot of abd pain, which she says is bad.  Asking what to do.  Uses [facility] [address].

## 2010-08-13 NOTE — Patient Instructions (Signed)
REFERRAL: GO THE THE FRONT ROOM AT THE ENTRANCE OF OUR CLINIC, NEAR CHECK IN. ASK FOR MARION. SHE WILL HELP YOU SET UP YOUR REFERRAL. DATE: TIME:  F/u tomorrow

## 2010-08-13 NOTE — Telephone Encounter (Signed)
Very worrisome, given acute worsening. Needs reevaluation. Have her come to office now for eval and I will work her in.

## 2010-08-13 NOTE — Progress Notes (Signed)
Tami Jones, a 55 y.o. female presents today in the office for the following:    F/u with acute worsening over the night until today. On Cipro 500 mg po bid for presumptive pyelo. Throwing up, pain in the stomache and in the abdomen.  The patient has felt subjectively feverish, but she is 98.7 today in the office.  Overnight, the patient worsened, and she is been feeling feverish, having chills, sweats, nausea and decreased appetite. She continues to have posterior flank and back pain on the left side as well as worsened abdominal pain, mostly in the epigastric region currently.  Relevant prior abdominal surgeries include cesarean section.  Patient Active Problem List  Diagnoses  . DIABETES MELLITUS, TYPE II  . HYPERCHOLESTEROLEMIA  . ANXIETY, SITUATIONAL  . DEPRESSION, SITUATIONAL  . ESSENTIAL HYPERTENSION  . GERD  . MENOPAUSAL SYNDROME  . Physical exam, annual  . Postmenopausal bleeding  . Arthritis, hip  . Pyelonephritis, acute  . Abdominal pain, epigastric   Past Medical History  Diagnosis Date  . Diabetes mellitus   . GERD (gastroesophageal reflux disease)   . Hyperlipidemia   . CVA (cerebral vascular accident) 03/2006    right occipital, Dr. Thad Ranger  . Hypertension    Past Surgical History  Procedure Date  . Cesarean section   . Tonsillectomy and adenoidectomy   . Esophagogastroduodenoscopy 05/2005   History  Substance Use Topics  . Smoking status: Never Smoker   . Smokeless tobacco: Never Used  . Alcohol Use: No   No family history on file. Allergies  Allergen Reactions  . Doxycycline Hyclate     REACTION: stomach upset  . Montelukast Sodium     REACTION: unspecified  . Nitrofurantoin     REACTION: unspecified  . Sulfa Antibiotics Nausea Only  . Tramadol Hcl     REACTION: Can not tolerate   Current Outpatient Prescriptions on File Prior to Visit  Medication Sig Dispense Refill  . aspirin 81 MG tablet Take 81 mg by mouth daily.        .  ciprofloxacin (CIPRO) 500 MG tablet Take 1 tablet (500 mg total) by mouth 2 (two) times daily.  20 tablet  0  . FLUoxetine (PROZAC) 20 MG capsule Take 1 capsule (20 mg total) by mouth daily.  30 capsule  3  . glipiZIDE (GLUCOTROL) 5 MG 24 hr tablet Take 5 mg by mouth daily.        Marland Kitchen HYDROcodone-acetaminophen (NORCO) 5-325 MG per tablet Take 1-2 tablets by mouth at bedtime as needed for pain.  60 tablet  0  . levalbuterol (XOPENEX HFA) 45 MCG/ACT inhaler Inhale 1-2 puffs into the lungs every 4 (four) hours as needed.        Marland Kitchen lisinopril-hydrochlorothiazide (PRINZIDE,ZESTORETIC) 20-25 MG per tablet Take 1 tablet by mouth daily.        . metoprolol (TOPROL-XL) 50 MG 24 hr tablet Take 50 mg by mouth daily.        . pravastatin (PRAVACHOL) 40 MG tablet Take 40 mg by mouth at bedtime.         No current facility-administered medications on file prior to visit.   Ros: As above: Chills, subjective fever, sweaty, no chest pain, no shortness of breath. Nausea. No vomiting. No diarrhea. Pain is described.   Physical Exam  Blood pressure 140/90, pulse 68, temperature 98.7 F (37.1 C), temperature source Oral, weight 241 lb 4 oz (109.43 kg).  GEN: WDWN, NAD, Non-toxic, A & O x 3, mildly sweaty  HEENT: Atraumatic, Normocephalic. Neck supple. No masses, No LAD. Ears and Nose: No external deformity. CV: RRR, No M/G/R. No JVD. No thrill. No extra heart sounds. PULM: CTA B, no wheezes, crackles, rhonchi. No retractions. No resp. distress. No accessory muscle use. ABD: Soft, nondistended, positive bowel sounds. Moderate epigastric tenderness. No rebound tenderness. No right lower quadrant and left lower quadrant tenderness. Positive left-sided costovertebral angle tenderness EXTR: No c/c/e NEURO Normal gait.  PSYCH: Normally interactive. Conversant. Not depressed or anxious appearing.  Calm demeanor.   Assessment and plan: Abdominal pain and flank pain. Urine culture remains pending. Positive UA for trace  blood and leukocyte esterase yesterday with urgency. CVA tenderness, and now progressive to abdominal pain.  Broaden differential diagnosis and obtain laboratories below, CBC with differential, comprehensive metabolic panel, lipase.  Potential resistance to Cipro, now we'll give Rocephin 1 g IM  Obtain a CT of the abdomen and pelvis with by mouth and IV contrast to evaluate for potential intra-abdominal pathology, diverticulitis, perforated abdomen, or other acute abdominal process.  If all of these are reassuring, and she is going to followup in the office tomorrow for recheck with her primary care provider. We discussed depending on results, may need to go to ER tonight.

## 2010-08-14 ENCOUNTER — Ambulatory Visit: Payer: 59 | Admitting: Internal Medicine

## 2010-08-14 ENCOUNTER — Other Ambulatory Visit: Payer: Self-pay | Admitting: *Deleted

## 2010-08-14 ENCOUNTER — Encounter: Payer: Self-pay | Admitting: Internal Medicine

## 2010-08-14 ENCOUNTER — Ambulatory Visit (INDEPENDENT_AMBULATORY_CARE_PROVIDER_SITE_OTHER): Payer: 59 | Admitting: Internal Medicine

## 2010-08-14 VITALS — BP 158/98 | HR 65 | Temp 98.0°F | Ht 63.0 in | Wt 240.0 lb

## 2010-08-14 DIAGNOSIS — N10 Acute tubulo-interstitial nephritis: Secondary | ICD-10-CM

## 2010-08-14 MED ORDER — HYDROCODONE-ACETAMINOPHEN 5-325 MG PO TABS: 1.0000 | ORAL_TABLET | Freq: Every evening | ORAL | Status: DC | PRN

## 2010-08-14 MED ORDER — CEFTRIAXONE SODIUM 1 G IJ SOLR
1.0000 g | Freq: Once | INTRAMUSCULAR | Status: AC
  Administered 2010-08-14: 1 g via INTRAMUSCULAR

## 2010-08-14 MED ORDER — CEFUROXIME AXETIL 250 MG PO TABS: 250.0000 mg | ORAL_TABLET | Freq: Two times a day (BID) | ORAL | Status: DC

## 2010-08-14 NOTE — Telephone Encounter (Signed)
rx sent to pharmacy by e-script , Spoke with patient and advised results   

## 2010-08-14 NOTE — Progress Notes (Signed)
Subjective:    Patient ID: Tami Jones, female    DOB: 08-07-1955, 55 y.o.   MRN: [id number]  HPI Not feeling better Having bad pain in left flank still Using hydrocodone --eases it "a little" Had sweats last night Fever still there but not bad  Voiding okay Does have sig frequency No bad dysuria No hematuria  Still with nausea Eating a little Able to take the cipro  Current Outpatient Prescriptions on File Prior to Visit  Medication Sig Dispense Refill  . aspirin 81 MG tablet Take 81 mg by mouth daily.        . ciprofloxacin (CIPRO) 500 MG tablet Take 1 tablet (500 mg total) by mouth 2 (two) times daily.  20 tablet  0  . FLUoxetine (PROZAC) 20 MG capsule Take 1 capsule (20 mg total) by mouth daily.  30 capsule  3  . glipiZIDE (GLUCOTROL) 5 MG 24 hr tablet Take 5 mg by mouth daily.        Marland Kitchen HYDROcodone-acetaminophen (NORCO) 5-325 MG per tablet Take 1-2 tablets by mouth at bedtime as needed for pain.  60 tablet  0  . levalbuterol (XOPENEX HFA) 45 MCG/ACT inhaler Inhale 1-2 puffs into the lungs every 4 (four) hours as needed.        Marland Kitchen lisinopril-hydrochlorothiazide (PRINZIDE,ZESTORETIC) 20-25 MG per tablet Take 1 tablet by mouth daily.        . metoprolol (TOPROL-XL) 50 MG 24 hr tablet Take 50 mg by mouth daily.        . pravastatin (PRAVACHOL) 40 MG tablet Take 40 mg by mouth at bedtime.         Current Facility-Administered Medications on File Prior to Visit  Medication Dose Route Frequency Provider Last Rate Last Dose  . cefTRIAXone (ROCEPHIN) injection 1,000 mg  1,000 mg Intramuscular Once Hannah Beat, MD   1,000 mg at 08/13/10 1421  . iohexol (OMNIPAQUE) 300 MG/ML injection 100 mL  100 mL Intravenous Once PRN Medication Radiologist   100 mL at 08/13/10 1623    Allergies  Allergen Reactions  . Doxycycline Hyclate     REACTION: stomach upset  . Montelukast Sodium     REACTION: unspecified  . Nitrofurantoin     REACTION: unspecified  . Sulfa Antibiotics Nausea Only    . Tramadol Hcl     REACTION: Can not tolerate    Past Medical History  Diagnosis Date  . Diabetes mellitus   . GERD (gastroesophageal reflux disease)   . Hyperlipidemia   . CVA (cerebral vascular accident) 03/2006    right occipital, Dr. Thad Ranger  . Hypertension     Past Surgical History  Procedure Date  . Cesarean section   . Tonsillectomy and adenoidectomy   . Esophagogastroduodenoscopy 05/2005    No family history on file.  History   Social History  . Marital Status: Married    Spouse Name: N/A    Number of Children: 2  . Years of Education: N/A   Occupational History  . LAB [employer]    Social History Main Topics  . Smoking status: Never Smoker   . Smokeless tobacco: Never Used  . Alcohol Use: No  . Drug Use: Not on file  . Sexually Active: Not on file   Other Topics Concern  . Not on file   Social History Narrative  . No narrative on file   Review of Systems No diarrhea No cough or SOB    Objective:   Physical Exam  Constitutional: She appears  well-developed and well-nourished.       Uncomfortable looking but no distress  Neck: Normal range of motion. Neck supple.  Pulmonary/Chest: Effort normal and breath sounds normal. No respiratory distress. She has no wheezes. She has no rales.  Abdominal: Soft. She exhibits no mass.       Mild left sided tenderness  Musculoskeletal:       Moderate left CVA tenderness  Lymphadenopathy:    She has no cervical adenopathy.          Assessment & Plan:

## 2010-08-14 NOTE — Assessment & Plan Note (Signed)
Better but still feels poorly Sig CVA tenderness still makes her uncomfortable---will refill hydrocodone Culture not back yet---continue cipro Rocephin again today  See back next week to assure resolution

## 2010-08-17 NOTE — Progress Notes (Signed)
Addressed with [facility] results  Multi-drug resistant  Dee calling to check on pt.   On Ceftin now (I resistance)  Susceptible to amox

## 2010-08-18 ENCOUNTER — Telehealth: Payer: Self-pay | Admitting: *Deleted

## 2010-08-18 NOTE — Telephone Encounter (Signed)
Still has burning dysuria--bad enough to need pain pills to get to sleep last night and 1/2 for work today Back pain is some better  Very confusing situation since urine culture not even positive Will have her restart the cipro and take along with the cefuroxime Has appt 7/6---consider urology eval if not better then

## 2010-08-18 NOTE — Telephone Encounter (Signed)
Patient was seen last week for a bladder infection and she says that she is not any better. She is still in a lot of pain, still having urine frequency. She is asking what she should do at this point. She feels that the antibiotic isn't really helping. Uses [facility] [address].

## 2010-08-21 ENCOUNTER — Ambulatory Visit (INDEPENDENT_AMBULATORY_CARE_PROVIDER_SITE_OTHER): Payer: 59 | Admitting: Internal Medicine

## 2010-08-21 ENCOUNTER — Encounter: Payer: Self-pay | Admitting: Internal Medicine

## 2010-08-21 DIAGNOSIS — N10 Acute tubulo-interstitial nephritis: Secondary | ICD-10-CM

## 2010-08-21 NOTE — Assessment & Plan Note (Signed)
Diagnosis has really been in question Seemed clinically appropriate at first but negative urine culture and course have thrown considerable confusion on the issue May be just some kind of muscular problem  Will stop the antibiotics Continue hydrocodone prn Try heat See back if persists

## 2010-08-21 NOTE — Progress Notes (Signed)
  Subjective:    Patient ID: Tami Jones, female    DOB: 1955/12/13, 55 y.o.   MRN: [id number]  HPI Pain continues Has needed the pain pills Felt better yesterday but worse today Appetite is off  No fever, chills or sweats Nervous shake last night though  Is just finishing antibiotics On cipro and the cefuroxime at my instruction  Mild burning dysuria yesterday Does note frequency but not sig different Did have incontinence once  Review of Systems No weight loss No rash    Objective:   Physical Exam  Constitutional: She appears well-developed and well-nourished. No distress.  Neck: Normal range of motion. Neck supple.  Pulmonary/Chest: Effort normal and breath sounds normal. No respiratory distress. She has no wheezes. She has no rales.  Abdominal: Soft. She exhibits no mass. There is tenderness.       Still with mild left flank tenderness  Musculoskeletal:       Only mild left CVA tenderness now  Lymphadenopathy:    She has no cervical adenopathy.  Skin: She is not diaphoretic.          Assessment & Plan:

## 2010-08-21 NOTE — Patient Instructions (Signed)
Please stop both antibiotics Try heat on the pain area Call for appointment again if the pain isn't much better within 2 weeks

## 2010-09-03 ENCOUNTER — Encounter: Payer: Self-pay | Admitting: Internal Medicine

## 2010-09-03 ENCOUNTER — Ambulatory Visit (INDEPENDENT_AMBULATORY_CARE_PROVIDER_SITE_OTHER): Payer: 59 | Admitting: Internal Medicine

## 2010-09-03 VITALS — BP 150/90 | HR 88 | Temp 98.0°F | Ht 63.0 in | Wt 243.0 lb

## 2010-09-03 DIAGNOSIS — M792 Neuralgia and neuritis, unspecified: Secondary | ICD-10-CM

## 2010-09-03 DIAGNOSIS — G5790 Unspecified mononeuropathy of unspecified lower limb: Secondary | ICD-10-CM

## 2010-09-03 MED ORDER — METOPROLOL SUCCINATE ER 50 MG PO TB24: 50.0000 mg | ORAL_TABLET | Freq: Every day | ORAL | Status: DC

## 2010-09-03 MED ORDER — GLIPIZIDE ER 5 MG PO TB24: 5.0000 mg | ORAL_TABLET | Freq: Every day | ORAL | Status: DC

## 2010-09-03 MED ORDER — LISINOPRIL-HYDROCHLOROTHIAZIDE 20-25 MG PO TABS: 1.0000 | ORAL_TABLET | Freq: Every day | ORAL | Status: DC

## 2010-09-03 MED ORDER — PRAVASTATIN SODIUM 40 MG PO TABS: 40.0000 mg | ORAL_TABLET | Freq: Every day | ORAL | Status: DC

## 2010-09-03 MED ORDER — GABAPENTIN 300 MG PO CAPS: 300.0000 mg | ORAL_CAPSULE | Freq: Every day | ORAL | Status: DC

## 2010-09-03 MED ORDER — FLUOXETINE HCL 20 MG PO CAPS: 20.0000 mg | ORAL_CAPSULE | Freq: Every day | ORAL | Status: DC

## 2010-09-03 MED ORDER — HYDROCODONE-ACETAMINOPHEN 5-325 MG PO TABS: 1.0000 | ORAL_TABLET | Freq: Every evening | ORAL | Status: DC | PRN

## 2010-09-03 NOTE — Progress Notes (Signed)
  Subjective:    Patient ID: Tami Jones, female    DOB: Nov 05, 1955, 55 y.o.   MRN: [id number]  HPI Still having pain Mostly in left flank and around into lateral LUQ Using the 1/2 hydrocodone in day and full one at night No change in pain with using arms or walking Pain is worse in bed at night  No sig stomach pain now--just a little No fever but had some chills 6 nights ago  Pain is aching and burning Night is really the worst time  Current Outpatient Prescriptions on File Prior to Visit  Medication Sig Dispense Refill  . aspirin 81 MG tablet Take 81 mg by mouth daily.        Marland Kitchen levalbuterol (XOPENEX HFA) 45 MCG/ACT inhaler Inhale 1-2 puffs into the lungs every 4 (four) hours as needed.          Allergies  Allergen Reactions  . Doxycycline Hyclate     REACTION: stomach upset  . Montelukast Sodium     REACTION: unspecified  . Nitrofurantoin     REACTION: unspecified  . Sulfa Antibiotics Nausea Only  . Tramadol Hcl     REACTION: Can not tolerate    Past Medical History  Diagnosis Date  . Diabetes mellitus   . GERD (gastroesophageal reflux disease)   . Hyperlipidemia   . CVA (cerebral vascular accident) 03/2006    right occipital, Dr. Thad Ranger  . Hypertension     Past Surgical History  Procedure Date  . Cesarean section   . Tonsillectomy and adenoidectomy   . Esophagogastroduodenoscopy 05/2005    No family history on file.  History   Social History  . Marital Status: Married    Spouse Name: N/A    Number of Children: 2  . Years of Education: N/A   Occupational History  . LAB [employer]    Social History Main Topics  . Smoking status: Never Smoker   . Smokeless tobacco: Never Used  . Alcohol Use: No  . Drug Use: Not on file  . Sexually Active: Not on file   Other Topics Concern  . Not on file   Social History Narrative  . No narrative on file   Review of Systems Has had some nausea and vomited once Appetite is not great--gets post prandial  bloating Very slight burning dysuria in past week    Objective:   Physical Exam  Constitutional: She appears well-developed and well-nourished. No distress.  Neck: Normal range of motion.  Pulmonary/Chest: Effort normal and breath sounds normal. No respiratory distress. She has no wheezes. She has no rales. She exhibits tenderness.       Localized tenderness in the skin around left flank in ~T10-11 dermatome  Abdominal: Soft.       Slight LUQ tenderness --but along the same dermatome  Lymphadenopathy:    She has no cervical adenopathy.  Skin: No rash noted.  Psychiatric: Her behavior is normal. Judgment and thought content normal.          Assessment & Plan:

## 2010-09-03 NOTE — Assessment & Plan Note (Signed)
Pain has evolved No signs of infection now Pain seems to be dermatomal though no rash to suggest shingles Has burning and aching and esp bad at night Doesn't seem to be diabetes related  Will try gabapentin at night Continue hydrocodone prn

## 2010-09-03 NOTE — Patient Instructions (Signed)
Please start the gabapentin at bedtime with 1 capsule. Increase to 2 capsules if the pain is still bad after 3 days and then 3 capsules at bedtime if not much better after 10 days

## 2010-09-10 ENCOUNTER — Telehealth: Payer: Self-pay | Admitting: *Deleted

## 2010-09-10 NOTE — Telephone Encounter (Signed)
Agreed -

## 2010-09-10 NOTE — Telephone Encounter (Signed)
Pt states she continues to have a lot of pain, was seen for this last week.  Has been taking one gabapentin at bedtime, advised her that she can increase to 2 to see if that helps better with the pain.  Pt said she will do that, will increase to 3 at bedtime after 10 days if still not better.

## 2010-09-21 ENCOUNTER — Encounter: Payer: Self-pay | Admitting: Internal Medicine

## 2010-09-21 ENCOUNTER — Ambulatory Visit (INDEPENDENT_AMBULATORY_CARE_PROVIDER_SITE_OTHER): Payer: 59 | Admitting: Internal Medicine

## 2010-09-21 ENCOUNTER — Telehealth: Payer: Self-pay | Admitting: *Deleted

## 2010-09-21 VITALS — BP 180/120 | HR 96 | Temp 98.2°F | Wt 240.0 lb

## 2010-09-21 DIAGNOSIS — M792 Neuralgia and neuritis, unspecified: Secondary | ICD-10-CM

## 2010-09-21 DIAGNOSIS — I10 Essential (primary) hypertension: Secondary | ICD-10-CM

## 2010-09-21 DIAGNOSIS — R52 Pain, unspecified: Secondary | ICD-10-CM

## 2010-09-21 DIAGNOSIS — G5790 Unspecified mononeuropathy of unspecified lower limb: Secondary | ICD-10-CM

## 2010-09-21 LAB — POCT URINALYSIS DIPSTICK
Bilirubin, UA: NEGATIVE
Glucose, UA: NEGATIVE
Ketones, UA: NEGATIVE
Spec Grav, UA: 1.03

## 2010-09-21 NOTE — Assessment & Plan Note (Signed)
Ongoing She doesn't really understand my explanation from the last time She stopped the gabapentin without letting me know (didn't help) but I explained that we need to increase the dose Can continue hydrocodone for now as well

## 2010-09-21 NOTE — Assessment & Plan Note (Signed)
Markedly worse I rechecked and got 198/116 on right This may be related to ongoing pain but still very high Will double the lisinopril

## 2010-09-21 NOTE — Telephone Encounter (Signed)
Spoke with pt, she is not any better today, appt made for this afternoon.

## 2010-09-21 NOTE — Patient Instructions (Signed)
Please restart the gabapentin with 2 tablets tonight. If pain is no better, take 3 tablets tomorrow night and then add another tablet every 2 days (up to 4 on 8/9, 5 on 8/11 and 6 on 8/13) Please call if no better by 8/15 Keep your upcoming appointment  Please increase the lisinopril/HCTZ to 1 tab twice a day

## 2010-09-21 NOTE — Telephone Encounter (Signed)
Please call Can add on at end of day if needs to be seen

## 2010-09-21 NOTE — Progress Notes (Signed)
Subjective:    Patient ID: Tami Jones, female    DOB: [birth year], 55 y.o.   MRN: [id number]  HPI Still has the pain in left flank Same area Using the hydrocodone every 6 hours---helps briefly then wears out  The gabapentin didn't help---even 600mg  at night Still has the most problems at night Didn't really have any sedation with this  Did have urinary incontinence once Didn't note any dysuria or hematuria  Current Outpatient Prescriptions on File Prior to Visit  Medication Sig Dispense Refill  . aspirin 81 MG tablet Take 81 mg by mouth daily.        Marland Kitchen FLUoxetine (PROZAC) 20 MG capsule Take 1 capsule (20 mg total) by mouth daily.  90 capsule  3  . glipiZIDE (GLUCOTROL XL) 5 MG 24 hr tablet Take 1 tablet (5 mg total) by mouth daily.  90 tablet  3  . HYDROcodone-acetaminophen (NORCO) 5-325 MG per tablet Take 1-2 tablets by mouth at bedtime as needed for pain.  180 tablet  0  . levalbuterol (XOPENEX HFA) 45 MCG/ACT inhaler Inhale 1-2 puffs into the lungs every 4 (four) hours as needed.        Marland Kitchen lisinopril-hydrochlorothiazide (PRINZIDE,ZESTORETIC) 20-25 MG per tablet Take 1 tablet by mouth daily.  90 tablet  3  . metoprolol (TOPROL-XL) 50 MG 24 hr tablet Take 1 tablet (50 mg total) by mouth daily.  90 tablet  3  . pravastatin (PRAVACHOL) 40 MG tablet Take 1 tablet (40 mg total) by mouth at bedtime.  90 tablet  3  . gabapentin (NEURONTIN) 300 MG capsule Take 1-2 capsules (300-600 mg total) by mouth at bedtime.  90 capsule  1    Allergies  Allergen Reactions  . Doxycycline Hyclate     REACTION: stomach upset  . Montelukast Sodium     REACTION: unspecified  . Nitrofurantoin     REACTION: unspecified  . Sulfa Antibiotics Nausea Only  . Tramadol Hcl     REACTION: Can not tolerate    Past Medical History  Diagnosis Date  . Diabetes mellitus   . GERD (gastroesophageal reflux disease)   . Hyperlipidemia   . CVA (cerebral vascular accident) 03/2006    right occipital, Dr. Thad Ranger   . Hypertension     Past Surgical History  Procedure Date  . Cesarean section   . Tonsillectomy and adenoidectomy   . Esophagogastroduodenoscopy 05/2005    No family history on file.  History   Social History  . Marital Status: Married    Spouse Name: N/A    Number of Children: 2  . Years of Education: N/A   Occupational History  . LAB [employer]    Social History Main Topics  . Smoking status: Never Smoker   . Smokeless tobacco: Never Used  . Alcohol Use: No  . Drug Use: Not on file  . Sexually Active: Not on file   Other Topics Concern  . Not on file   Social History Narrative  . No narrative on file   Review of Systems Feels jittery Checked BP earlier in week-- 150/100    Objective:   Physical Exam  Constitutional: She appears well-developed and well-nourished.       Uncomfortable but no distress  Abdominal: Soft. There is no tenderness.  Musculoskeletal:       No back tenderness but skin is sensitive ~L1 on the left  Skin: No rash noted.          Assessment & Plan:

## 2010-09-21 NOTE — Telephone Encounter (Signed)
Triage Record Num: [id number] Operator: Martie Lee Long Patient Name: [facility] Call Date & Time: 09/18/2010 6:29:13PM Patient Phone: [phone] PCP: Tillman Abide Patient Gender: Female PCP Fax : [phone] Patient DOB: 24-Apr-1955 Practice Name: [facility] Reason for Call: Nichelle/patient calling about urinary incontenience. Pt has pain to her left side and back. Pt is nauseated. Low grade fever, but has not measured it. Pt has chills. Onset 09/16/10. Instructed to see Provider 4hrs. Protocol(s) Used: Flank Pain Protocol(s) Used: Urinary Symptoms - Female Recommended Outcome per Protocol: See Provider within 4 hours Reason for Outcome: Flank pain Flank pain or low back pain AND urinary tract symptoms Care Advice: Increase intake of fluids. Try to drink 8 oz. (.2 liter) every hour when awake, including unsweetened cranberry juice, unless on restricted fluids for other medical reasons. Take sips of fluid or eat ice chips if nauseated or vomiting. ~ ~ SYMPTOM / CONDITION MANAGEMENT Analgesic/Antipyretic Advice - Acetaminophen: Consider acetaminophen as directed on label or by pharmacist/provider for pain or fever PRECAUTIONS: - Use if there is no history of liver disease, alcoholism, or intake of three or more alcohol drinks per day - Only if approved by provider during pregnancy or when breastfeeding - During pregnancy, acetaminophen should not be taken more than 3 consecutive days without telling provider - Do not exceed recommended dose or frequency ~ Analgesic/Antipyretic Advice - NSAIDs: Consider aspirin, ibuprofen, naproxen or ketoprofen for pain or fever as directed on label or by pharmacist/provider. PRECAUTIONS: - If over 44 years of age, should not take longer than 1 week without consulting provider. EXCEPTIONS: - Should not be used if taking blood thinners or have bleeding problems. - Do not use if have history of sensitivity/allergy to any of these  medications; or history of cardiovascular, ulcer, kidney, liver disease or diabetes unless approved by provider. - Do not exceed recommended dose or frequency. ~ 09/18/2010 6:38:01PM Page 1 of 1 [email or web address]

## 2010-09-29 ENCOUNTER — Other Ambulatory Visit: Payer: Self-pay | Admitting: *Deleted

## 2010-09-29 NOTE — Telephone Encounter (Signed)
Okay to change to tid prn Fill #90 x 0 Make sure she is trying the gabapentin again and increasing it as we discussed

## 2010-09-29 NOTE — Telephone Encounter (Signed)
Pt is asking for a new script for vicodin.  She has been taking one three times a day and current script is for 1-2 a day.  She says she was given # 60 on 7/23 and is now out.  She wont be able to get this filled until 8/23, because with current script # 60 would be a one month supply and pt needs 90, since she takes one 3 times a day.  She says she needs this because she continues to be in a lot of pain.  Uses [facility] [address].

## 2010-09-30 MED ORDER — HYDROCODONE-ACETAMINOPHEN 5-325 MG PO TABS: 1.0000 | ORAL_TABLET | Freq: Three times a day (TID) | ORAL | Status: DC | PRN

## 2010-09-30 NOTE — Telephone Encounter (Signed)
rx called into pharmacy Spoke with patient and advised results   

## 2010-10-05 ENCOUNTER — Telehealth: Payer: Self-pay | Admitting: *Deleted

## 2010-10-08 ENCOUNTER — Ambulatory Visit: Payer: 59 | Admitting: Internal Medicine

## 2010-10-14 ENCOUNTER — Ambulatory Visit: Payer: 59 | Admitting: Internal Medicine

## 2010-10-15 ENCOUNTER — Ambulatory Visit (INDEPENDENT_AMBULATORY_CARE_PROVIDER_SITE_OTHER): Payer: 59 | Admitting: Internal Medicine

## 2010-10-15 ENCOUNTER — Encounter: Payer: Self-pay | Admitting: Internal Medicine

## 2010-10-15 DIAGNOSIS — I10 Essential (primary) hypertension: Secondary | ICD-10-CM

## 2010-10-15 DIAGNOSIS — M792 Neuralgia and neuritis, unspecified: Secondary | ICD-10-CM

## 2010-10-15 DIAGNOSIS — G5790 Unspecified mononeuropathy of unspecified lower limb: Secondary | ICD-10-CM

## 2010-10-15 MED ORDER — NORTRIPTYLINE HCL 25 MG PO CAPS: 25.0000 mg | ORAL_CAPSULE | Freq: Every day | ORAL | Status: DC

## 2010-10-15 MED ORDER — AMLODIPINE BESYLATE 5 MG PO TABS: 5.0000 mg | ORAL_TABLET | Freq: Every day | ORAL | Status: DC

## 2010-10-15 NOTE — Assessment & Plan Note (Signed)
Persists Only able to tolerate 600mg  of gabapentin Will add nortriptylline

## 2010-10-15 NOTE — Patient Instructions (Signed)
Please continue the gabapentin 600mg  at bedtime Start the nortriptylline tonight at 1 at bedtime. If no side effects, increase to 2 at bedtime on Saturday. Call within 2 weeks if the pain is not any better Add the new medication, amlodipine, for your blood pressure

## 2010-10-15 NOTE — Assessment & Plan Note (Signed)
Repeat by me 196/104 Not clear if this could be all related to her pain Will add amlodipine

## 2010-10-15 NOTE — Progress Notes (Signed)
Subjective:    Patient ID: Tami Jones, female    DOB: 12/15/1955, 55 y.o.   MRN: [id number]  HPI Still having the pain Really affecting her life Couldn't tolerate more than 2 of the gabapentin (3 made her loopy and gave her bad nightmare) Still uses the hydrocodone to be able to work---makes her a bit jittery though  Has been taking the lisinopril/HCTZ bid Still on metoprolol Hasn't checked BP Occ headaches  Has had a couple of spells of upper substernal chest discomfort Resolved on its own No SOB  Current Outpatient Prescriptions on File Prior to Visit  Medication Sig Dispense Refill  . aspirin 81 MG tablet Take 81 mg by mouth daily.        Marland Kitchen FLUoxetine (PROZAC) 20 MG capsule Take 1 capsule (20 mg total) by mouth daily.  90 capsule  3  . gabapentin (NEURONTIN) 300 MG capsule Take 600 mg by mouth at bedtime. Increase by 1 tablet every 2 days up to 6 tablets at bedtime       . glipiZIDE (GLUCOTROL XL) 5 MG 24 hr tablet Take 1 tablet (5 mg total) by mouth daily.  90 tablet  3  . HYDROcodone-acetaminophen (NORCO) 5-325 MG per tablet Take 1-2 tablets by mouth 3 (three) times daily as needed.  90 tablet  0  . levalbuterol (XOPENEX HFA) 45 MCG/ACT inhaler Inhale 1-2 puffs into the lungs every 4 (four) hours as needed.        Marland Kitchen lisinopril-hydrochlorothiazide (PRINZIDE,ZESTORETIC) 20-25 MG per tablet Take 1 tablet by mouth 2 (two) times daily.        . metoprolol (TOPROL-XL) 50 MG 24 hr tablet Take 1 tablet (50 mg total) by mouth daily.  90 tablet  3  . pravastatin (PRAVACHOL) 40 MG tablet Take 1 tablet (40 mg total) by mouth at bedtime.  90 tablet  3    Allergies  Allergen Reactions  . Doxycycline Hyclate     REACTION: stomach upset  . Montelukast Sodium     REACTION: unspecified  . Nitrofurantoin     REACTION: unspecified  . Sulfa Antibiotics Nausea Only  . Tramadol Hcl     REACTION: Can not tolerate    Past Medical History  Diagnosis Date  . Diabetes mellitus   . GERD  (gastroesophageal reflux disease)   . Hyperlipidemia   . CVA (cerebral vascular accident) 03/2006    right occipital, Dr. Thad Ranger  . Hypertension     Past Surgical History  Procedure Date  . Cesarean section   . Tonsillectomy and adenoidectomy   . Esophagogastroduodenoscopy 05/2005    No family history on file.  History   Social History  . Marital Status: Married    Spouse Name: N/A    Number of Children: 2  . Years of Education: N/A   Occupational History  . LAB [employer]    Social History Main Topics  . Smoking status: Never Smoker   . Smokeless tobacco: Never Used  . Alcohol Use: No  . Drug Use: Not on file  . Sexually Active: Not on file   Other Topics Concern  . Not on file   Social History Narrative  . No narrative on file   Review of Systems Gets some itching along the flank also Appetite not that great    Objective:   Physical Exam  Constitutional: She appears well-developed and well-nourished. No distress.  Neck: Normal range of motion.  Cardiovascular: Normal rate, regular rhythm and normal heart sounds.  Exam reveals no gallop.   No murmur heard. Pulmonary/Chest: Effort normal. No respiratory distress. She has no wheezes. She has no rales.  Musculoskeletal: She exhibits no edema.  Lymphadenopathy:    She has no cervical adenopathy.  Psychiatric:       Clearly frustrated but mood neutral          Assessment & Plan:

## 2010-10-23 NOTE — Telephone Encounter (Signed)
Opened in error

## 2010-11-13 ENCOUNTER — Ambulatory Visit (INDEPENDENT_AMBULATORY_CARE_PROVIDER_SITE_OTHER): Payer: 59 | Admitting: Internal Medicine

## 2010-11-13 DIAGNOSIS — F438 Other reactions to severe stress: Secondary | ICD-10-CM

## 2010-11-13 NOTE — Progress Notes (Signed)
  Subjective:    Patient ID: Tami Jones, female    DOB: 27-Nov-1955, 55 y.o.   MRN: [id number]  HPI  No show for visit  Review of Systems     Objective:   Physical Exam        Assessment & Plan:

## 2010-11-13 NOTE — Progress Notes (Signed)
Patient no showed appointment today with Dr. Alphonsus Sias at 9:15.

## 2011-02-03 ENCOUNTER — Encounter: Payer: Self-pay | Admitting: Internal Medicine

## 2011-02-03 ENCOUNTER — Ambulatory Visit (INDEPENDENT_AMBULATORY_CARE_PROVIDER_SITE_OTHER): Payer: 59 | Admitting: Internal Medicine

## 2011-02-03 VITALS — BP 158/90 | HR 72 | Temp 98.1°F | Ht 63.0 in | Wt 219.0 lb

## 2011-02-03 DIAGNOSIS — E119 Type 2 diabetes mellitus without complications: Secondary | ICD-10-CM

## 2011-02-03 DIAGNOSIS — F4321 Adjustment disorder with depressed mood: Secondary | ICD-10-CM

## 2011-02-03 DIAGNOSIS — I10 Essential (primary) hypertension: Secondary | ICD-10-CM

## 2011-02-03 MED ORDER — PRAVASTATIN SODIUM 40 MG PO TABS: 40.0000 mg | ORAL_TABLET | Freq: Every day | ORAL | Status: DC

## 2011-02-03 MED ORDER — GLIPIZIDE ER 5 MG PO TB24: 5.0000 mg | ORAL_TABLET | Freq: Every day | ORAL | Status: DC

## 2011-02-03 MED ORDER — LISINOPRIL-HYDROCHLOROTHIAZIDE 20-25 MG PO TABS: 1.0000 | ORAL_TABLET | Freq: Two times a day (BID) | ORAL | Status: DC

## 2011-02-03 MED ORDER — METOPROLOL SUCCINATE ER 50 MG PO TB24: 50.0000 mg | ORAL_TABLET | Freq: Every day | ORAL | Status: DC

## 2011-02-03 NOTE — Assessment & Plan Note (Signed)
No results found for this basename: HGBA1C   Feels the control is fine Will recheck

## 2011-02-03 NOTE — Assessment & Plan Note (Signed)
BP Readings from Last 3 Encounters:  02/03/11 158/90  10/15/10 188/114  09/21/10 180/120   Has been too high but is better Ongoing stress Will increase meds if still up next time

## 2011-02-03 NOTE — Progress Notes (Signed)
  Subjective:    Patient ID: [name], female    DOB: 1955-07-14, 55 y.o.   MRN: [id number]  HPI Still having lots of stress Day to day issues with husband--he seems to have some confusion Multiple medical issues and he is critical of her Tried the fluoxetine but it actually made her feel worse Stopped about 1 month ago  Gets stressed  Doesn't want to eat Weight is down 18# Feels sad much of the time "No happy thoughts" Not sleeping well No suicidal thoughts or thinking of dying  Sugars have been okay Checks regularly  No chest pain No SOB No panic attacks  Current Outpatient Prescriptions on File Prior to Visit  Medication Sig Dispense Refill  . aspirin 81 MG tablet Take 81 mg by mouth daily.          Allergies  Allergen Reactions  . Doxycycline Hyclate     REACTION: stomach upset  . Montelukast Sodium     REACTION: unspecified  . Nitrofurantoin     REACTION: unspecified  . Sulfa Antibiotics Nausea Only  . Tramadol Hcl     REACTION: Can not tolerate    Past Medical History  Diagnosis Date  . Diabetes mellitus   . GERD (gastroesophageal reflux disease)   . Hyperlipidemia   . CVA (cerebral vascular accident) 03/2006    right occipital, Dr. Thad Ranger  . Hypertension     Past Surgical History  Procedure Date  . Cesarean section   . Tonsillectomy and adenoidectomy   . Esophagogastroduodenoscopy 05/2005    No family history on file.  History   Social History  . Marital Status: Married    Spouse Name: N/A    Number of Children: 2  . Years of Education: N/A   Occupational History  . LAB [employer]    Social History Main Topics  . Smoking status: Never Smoker   . Smokeless tobacco: Never Used  . Alcohol Use: No  . Drug Use: Not on file  . Sexually Active: Not on file   Other Topics Concern  . Not on file   Social History Narrative  . No narrative on file   Review of Systems Feels nervous on regular basis Able to settle herself by leaving  situation    Objective:   Physical Exam  Constitutional: She appears well-developed and well-nourished. No distress.  Neck: Normal range of motion.  Cardiovascular: Normal rate, regular rhythm and normal heart sounds.  Exam reveals no gallop.   No murmur heard. Pulmonary/Chest: Effort normal and breath sounds normal. No respiratory distress. She has no wheezes. She has no rales.  Musculoskeletal: She exhibits no edema.  Lymphadenopathy:    She has no cervical adenopathy.  Psychiatric: Her behavior is normal. Judgment and thought content normal.       Dysthymic and mild anxiety Appropriate affect          Assessment & Plan:

## 2011-02-03 NOTE — Patient Instructions (Signed)
Please set up appointment with Dr Perrin 

## 2011-02-03 NOTE — Assessment & Plan Note (Signed)
Seems to be worsening Probably not major depression though Failed SSRI--got worse Will try referral for psychologist

## 2011-02-04 ENCOUNTER — Ambulatory Visit: Payer: 59 | Admitting: Internal Medicine

## 2011-03-09 ENCOUNTER — Ambulatory Visit: Payer: 59 | Admitting: Psychology

## 2011-05-12 ENCOUNTER — Ambulatory Visit (INDEPENDENT_AMBULATORY_CARE_PROVIDER_SITE_OTHER): Payer: 59 | Admitting: Internal Medicine

## 2011-05-12 ENCOUNTER — Other Ambulatory Visit: Payer: Self-pay | Admitting: *Deleted

## 2011-05-12 ENCOUNTER — Encounter: Payer: Self-pay | Admitting: Internal Medicine

## 2011-05-12 VITALS — BP 140/80 | HR 78 | Temp 98.6°F | Wt 204.0 lb

## 2011-05-12 DIAGNOSIS — N390 Urinary tract infection, site not specified: Secondary | ICD-10-CM

## 2011-05-12 DIAGNOSIS — R300 Dysuria: Secondary | ICD-10-CM

## 2011-05-12 LAB — POCT URINALYSIS DIPSTICK
Bilirubin, UA: NEGATIVE
Glucose, UA: NEGATIVE
Nitrite, UA: POSITIVE
Spec Grav, UA: 1.015

## 2011-05-12 MED ORDER — CIPROFLOXACIN HCL 500 MG PO TABS: 500.0000 mg | ORAL_TABLET | Freq: Two times a day (BID) | ORAL | Status: DC

## 2011-05-12 MED ORDER — CIPROFLOXACIN HCL 500 MG PO TABS: 500.0000 mg | ORAL_TABLET | Freq: Two times a day (BID) | ORAL | Status: AC

## 2011-05-12 NOTE — Assessment & Plan Note (Signed)
Mild systemic symptoms but no CVA tenderness Probably no renal involvement but will check culture to confirm proper treatment Will start empiric Rx with cipro for now

## 2011-05-12 NOTE — Telephone Encounter (Signed)
[facility] was having computer problems, canceled rx and sent to [facility], per pt request

## 2011-05-12 NOTE — Progress Notes (Signed)
Addended by: Alvina Chou on: 05/12/2011 03:01 PM   Modules accepted: Orders

## 2011-05-12 NOTE — Progress Notes (Signed)
  Subjective:    Patient ID: Tami Jones, female    DOB: 1955/12/10, 56 y.o.   MRN: [id number]  HPI Started with urinary symtoms yesterday Has pain suprapubic and in perineum Pain along both flanks  Chills last night Some fever Some shivering as well  Some dysuria No hematuria Has urgency and increased frequency  No meds for this  Mood is not better Trying to exercise more and watching diet Has lost ~15# more  Current Outpatient Prescriptions on File Prior to Visit  Medication Sig Dispense Refill  . aspirin 81 MG tablet Take 81 mg by mouth daily.        Marland Kitchen glipiZIDE (GLUCOTROL XL) 5 MG 24 hr tablet Take 1 tablet (5 mg total) by mouth daily.  90 tablet  3  . lisinopril-hydrochlorothiazide (PRINZIDE,ZESTORETIC) 20-25 MG per tablet Take 1 tablet by mouth 2 (two) times daily.  180 tablet  3  . metoprolol (TOPROL-XL) 50 MG 24 hr tablet Take 1 tablet (50 mg total) by mouth daily.  90 tablet  3  . pravastatin (PRAVACHOL) 40 MG tablet Take 1 tablet (40 mg total) by mouth at bedtime.  90 tablet  3    Allergies  Allergen Reactions  . Doxycycline Hyclate     REACTION: stomach upset  . Montelukast Sodium     REACTION: unspecified  . Nitrofurantoin     REACTION: unspecified  . Sulfa Antibiotics Nausea Only  . Tramadol Hcl     REACTION: Can not tolerate    Past Medical History  Diagnosis Date  . Diabetes mellitus   . GERD (gastroesophageal reflux disease)   . Hyperlipidemia   . CVA (cerebral vascular accident) 03/2006    right occipital, Dr. Thad Ranger  . Hypertension     Past Surgical History  Procedure Date  . Cesarean section   . Tonsillectomy and adenoidectomy   . Esophagogastroduodenoscopy 05/2005    No family history on file.  History   Social History  . Marital Status: Married    Spouse Name: N/A    Number of Children: 2  . Years of Education: N/A   Occupational History  . LAB [employer]    Social History Main Topics  . Smoking status: Never Smoker   .  Smokeless tobacco: Never Used  . Alcohol Use: No  . Drug Use: Not on file  . Sexually Active: Not on file   Other Topics Concern  . Not on file   Social History Narrative  . No narrative on file   Review of Systems Some nausea No vomiting or diarrhea     Objective:   Physical Exam  Constitutional: She appears well-developed and well-nourished. No distress.  Neck: Normal range of motion. Neck supple. No thyromegaly present.  Pulmonary/Chest: Effort normal and breath sounds normal. No respiratory distress. She has no wheezes. She has no rales.  Abdominal: Soft. Bowel sounds are normal. She exhibits no distension. There is tenderness.       Suprapubic and mild generalized tenderness  Lymphadenopathy:    She has no cervical adenopathy.          Assessment & Plan:

## 2011-05-12 NOTE — Patient Instructions (Signed)
Please check about the appt with Dr Roxy Cedar never was set up

## 2011-05-14 LAB — URINE CULTURE

## 2011-06-07 ENCOUNTER — Ambulatory Visit (INDEPENDENT_AMBULATORY_CARE_PROVIDER_SITE_OTHER): Payer: 59 | Admitting: Internal Medicine

## 2011-06-07 ENCOUNTER — Telehealth: Payer: Self-pay

## 2011-06-07 ENCOUNTER — Encounter: Payer: Self-pay | Admitting: Internal Medicine

## 2011-06-07 ENCOUNTER — Encounter: Payer: 59 | Admitting: Internal Medicine

## 2011-06-07 VITALS — BP 150/88 | HR 85 | Temp 98.4°F | Ht 63.0 in | Wt 205.0 lb

## 2011-06-07 DIAGNOSIS — Z23 Encounter for immunization: Secondary | ICD-10-CM

## 2011-06-07 DIAGNOSIS — E7800 Pure hypercholesterolemia, unspecified: Secondary | ICD-10-CM

## 2011-06-07 DIAGNOSIS — M25552 Pain in left hip: Secondary | ICD-10-CM

## 2011-06-07 DIAGNOSIS — Z0000 Encounter for general adult medical examination without abnormal findings: Secondary | ICD-10-CM

## 2011-06-07 DIAGNOSIS — M25559 Pain in unspecified hip: Secondary | ICD-10-CM

## 2011-06-07 DIAGNOSIS — E119 Type 2 diabetes mellitus without complications: Secondary | ICD-10-CM

## 2011-06-07 DIAGNOSIS — F4321 Adjustment disorder with depressed mood: Secondary | ICD-10-CM

## 2011-06-07 DIAGNOSIS — I10 Essential (primary) hypertension: Secondary | ICD-10-CM

## 2011-06-07 MED ORDER — HYDROCODONE-ACETAMINOPHEN 5-325 MG PO TABS: 1.0000 | ORAL_TABLET | Freq: Every evening | ORAL | Status: AC | PRN

## 2011-06-07 NOTE — Telephone Encounter (Signed)
Pt wanted to verify appts with Dr Alphonsus Sias and Dr Laymond Purser. Verified Dr Vassie Moselle appt today at 3pm and gave pt Frederic Jericho # [phone] re Dr Bethel Born appt.

## 2011-06-07 NOTE — Assessment & Plan Note (Signed)
BP Readings from Last 3 Encounters:  06/07/11 150/88  05/12/11 140/80  02/03/11 158/90   Up some today  No changes unless urine microal abnormal---has lots of stress now

## 2011-06-07 NOTE — Assessment & Plan Note (Signed)
Ongoing issues  Getting counseling  Will need legal assistance if they seperate

## 2011-06-07 NOTE — Assessment & Plan Note (Signed)
No problems with med Will check labs 

## 2011-06-07 NOTE — Patient Instructions (Signed)
Please set up the orthopedic referral

## 2011-06-07 NOTE — Assessment & Plan Note (Signed)
Will defer mammo till 2014 Chooses to do stool immunoassay Pneumovax today Consumed with stress---but trying to eat better

## 2011-06-07 NOTE — Assessment & Plan Note (Signed)
Working on lifestyle  Will check labs

## 2011-06-07 NOTE — Progress Notes (Signed)
Subjective:    Patient ID: Tami Jones, female    DOB: 05/21/1955, 56 y.o.   MRN: [id number]  HPI Here for physical  UTI has resolved  Having trouble with left leg pain---starts in hip and goes down leg all the way to foot Keeps her from sleeping Used heating pad last night--some help Started 3 weeks ago or so---intermittent --but has worsened Has become more persistent No easing factors Has been working overtime in past 2 weeks--seems to make it worse to be on feet all day Doesn't feel better in bed Some weakness noted--hasn't fallen though No injury Has tried ibuprofen 600mg  ---does take the "edge" off  Checks sugars about every 3 weeks Eating better Has kept the weight off that she lost Usually under 120 Due for eye exam---[facility]  Current Outpatient Prescriptions on File Prior to Visit  Medication Sig Dispense Refill  . aspirin 81 MG tablet Take 81 mg by mouth daily.        Marland Kitchen glipiZIDE (GLUCOTROL XL) 5 MG 24 hr tablet Take 1 tablet (5 mg total) by mouth daily.  90 tablet  3  . lisinopril-hydrochlorothiazide (PRINZIDE,ZESTORETIC) 20-25 MG per tablet Take 1 tablet by mouth 2 (two) times daily.  180 tablet  3  . metoprolol (TOPROL-XL) 50 MG 24 hr tablet Take 1 tablet (50 mg total) by mouth daily.  90 tablet  3  . pravastatin (PRAVACHOL) 40 MG tablet Take 1 tablet (40 mg total) by mouth at bedtime.  90 tablet  3    Allergies  Allergen Reactions  . Doxycycline Hyclate     REACTION: stomach upset  . Montelukast Sodium     REACTION: unspecified  . Nitrofurantoin     REACTION: unspecified  . Sulfa Antibiotics Nausea Only  . Tramadol Hcl     REACTION: Can not tolerate    Past Medical History  Diagnosis Date  . Diabetes mellitus   . GERD (gastroesophageal reflux disease)   . Hyperlipidemia   . CVA (cerebral vascular accident) 03/2006    right occipital, Dr. Thad Ranger  . Hypertension     Past Surgical History  Procedure Date  . Cesarean section    . Tonsillectomy and adenoidectomy   . Esophagogastroduodenoscopy 05/2005    No family history on file.  History   Social History  . Marital Status: Married    Spouse Name: N/A    Number of Children: 2  . Years of Education: N/A   Occupational History  . LAB [employer]    Social History Main Topics  . Smoking status: Never Smoker   . Smokeless tobacco: Never Used  . Alcohol Use: No  . Drug Use: Not on file  . Sexually Active: Not on file   Other Topics Concern  . Not on file   Social History Narrative  . No narrative on file   Review of Systems  Constitutional: Negative for fatigue and unexpected weight change.       Wears seat belt  HENT: Positive for dental problem. Negative for hearing loss, congestion, rhinorrhea and tinnitus.        Overdue for dentist---has some broken teeth  Eyes: Negative for visual disturbance.       No diplopia or unilateral vision loss Some worsening with reading  Respiratory: Negative for cough, chest tightness and shortness of breath.   Cardiovascular: Positive for chest pain and palpitations. Negative for leg swelling.       Occ brief upper left chest pain---usually  after working for extended time and then sits.  Occ racing heart---very brief  Gastrointestinal: Positive for constipation. Negative for nausea, vomiting, abdominal pain and blood in stool.       No heartburn  Genitourinary: Negative for dysuria, vaginal bleeding, difficulty urinating, pelvic pain and dyspareunia.       No sex-no problem  Musculoskeletal: Positive for back pain and arthralgias. Negative for joint swelling.       Right low back pain which radiates to leg  Skin: Negative for rash.       No suspicious lesions  Neurological: Positive for headaches. Negative for dizziness, syncope, weakness, light-headedness and numbness.       Occ mild occipital headaches  Hematological: Negative for adenopathy. Does not bruise/bleed easily.  Psychiatric/Behavioral: Positive for  sleep disturbance and dysphoric mood. The patient is nervous/anxious.        Ongoing stress with marriage Expects seperation soon Has seen Dr Laymond Purser for counseling Ongoing sleep problems--leg pain has worsened of late       Objective:   Physical Exam  Constitutional: She is oriented to person, place, and time. She appears well-developed and well-nourished. No distress.  HENT:  Head: Normocephalic and atraumatic.  Right Ear: External ear normal.  Left Ear: External ear normal.  Mouth/Throat: No oropharyngeal exudate.  Eyes: Conjunctivae and EOM are normal. Pupils are equal, round, and reactive to light.  Neck: Normal range of motion. Neck supple. No thyromegaly present.  Cardiovascular: Normal rate, regular rhythm, normal heart sounds and intact distal pulses.  Exam reveals no gallop.   No murmur heard. Pulmonary/Chest: Effort normal and breath sounds normal. No respiratory distress. She has no wheezes. She has no rales.  Abdominal: Soft. There is no tenderness.  Genitourinary:       Np breast masses or discharge  Musculoskeletal: She exhibits no edema and no tenderness.       Hip and leg pain recreated with internal rotation of left hip Some left hip tenderness laterally  Lymphadenopathy:    She has no cervical adenopathy.    She has no axillary adenopathy.  Neurological: She is alert and oriented to person, place, and time.       Very slight weakness in left leg--appears to be secondary to pain  Skin: No rash noted. No erythema.  Psychiatric:       Mildly anxious Chronic depressed mood Appropriate affect          Assessment & Plan:

## 2011-06-07 NOTE — Assessment & Plan Note (Signed)
With radiation down leg Exam suggests hip arthritis is reason for pain Will set up ortho eval

## 2011-06-08 LAB — BASIC METABOLIC PANEL
CO2: 26 mmol/L (ref 20–32)
Calcium: 9.2 mg/dL (ref 8.7–10.2)
Chloride: 105 mmol/L (ref 97–108)
Creatinine, Ser: 0.99 mg/dL (ref 0.57–1.00)
Potassium: 3.9 mmol/L (ref 3.5–5.2)
Sodium: 143 mmol/L (ref 134–144)

## 2011-06-08 LAB — HEPATIC FUNCTION PANEL
ALT: 27 IU/L (ref 0–32)
AST: 20 IU/L (ref 0–40)
Alkaline Phosphatase: 82 IU/L (ref 25–150)
Bilirubin, Direct: 0.08 mg/dL (ref 0.00–0.40)
Total Bilirubin: 0.2 mg/dL (ref 0.0–1.2)

## 2011-06-08 LAB — CBC WITH DIFFERENTIAL/PLATELET
Basophils Absolute: 0 10*3/uL (ref 0.0–0.2)
Eosinophils Absolute: 0.1 10*3/uL (ref 0.0–0.4)
Hemoglobin: 13.9 g/dL (ref 11.1–15.9)
Immature Grans (Abs): 0 10*3/uL (ref 0.0–0.1)
Immature Granulocytes: 0 % (ref 0–2)
Lymphs: 39 % (ref 14–46)
MCH: 30.3 pg (ref 26.6–33.0)
MCHC: 33.3 g/dL (ref 31.5–35.7)
Monocytes: 8 % (ref 4–13)
Neutrophils Relative %: 52 % (ref 40–74)
RDW: 13.1 % (ref 12.3–15.4)

## 2011-06-08 LAB — TSH: TSH: 1.48 u[IU]/mL (ref 0.450–4.500)

## 2011-06-08 LAB — MICROALBUMIN / CREATININE URINE RATIO
Creatinine, Ur: 89.2 mg/dL (ref 15.0–278.0)
Microalb Creat Ratio: 4.5 mg/g creat (ref 0.0–30.0)
Microalbumin, Urine: 4 ug/mL (ref 0.0–17.0)

## 2011-06-08 LAB — LIPID PANEL
Cholesterol, Total: 212 mg/dL — ABNORMAL HIGH (ref 100–199)
LDL Calculated: 140 mg/dL — ABNORMAL HIGH (ref 0–99)

## 2011-06-10 ENCOUNTER — Telehealth: Payer: Self-pay | Admitting: Internal Medicine

## 2011-06-10 NOTE — Telephone Encounter (Signed)
Left message on pt's VM with results, advised pt to call if any further problems.

## 2011-06-10 NOTE — Telephone Encounter (Signed)
Please check on her Sounds like local reaction which is not dangerous Continue analgesics and cold compreses prn

## 2011-06-10 NOTE — Telephone Encounter (Signed)
Triage Record Num: [id number] Operator: Candida Peeling Patient Name: [facility] Call Date & Time: 06/09/2011 6:17:57PM Patient Phone: [phone] PCP: Tillman Abide Patient Gender: Female PCP Fax : [phone] Patient DOB: Sep 29, 1955 [facility] Name: [facility] Reason for Call: Caller: Tacy/Patient; PCP: Tillman Abide I.; CB#: [phone]; Call regarding Had shot Monday, swollen, sore, painful; Onset 06/08/11 per Aram Beecham left arm is swollen, "feels a knot", sore, sl raised, able to move arm, no redness but sl warm, had pneumonia vaccine on 06/07/11. Afebrile. Has tried cool compress and taking Hydrocodone ordered for leg pain, minimal relief. All emergent s/s r/o per Abrasions, Lacerations, Puncture Wounds. Home care advice given. Protocol(s) Used: Abrasions, Lacerations, Puncture Wounds Recommended Outcome per Protocol: Provide Home/Self Care Reason for Outcome: Localized soreness/tenderness, swelling or discoloration of skin at injection site (including immunizations) Care Advice: ~ Call provider if symptoms worsen or new symptoms develop. Call provider if wound or area around wound becomes increasingly red or painful, there is a purulent or foul smelling discharge, red streaks develop leading away from wound, or if you develop any temperature elevation. ~ ~ SYMPTOM / CONDITION MANAGEMENT ~ Apply a cool compress to the area for 20 minutes 3 to 4 times a day to relieve discomfort. ~ CAUTIONS Analgesic/Antipyretic Advice - Acetaminophen: Consider acetaminophen as directed on label or by pharmacist/provider for pain or fever PRECAUTIONS: - Use if there is no history of liver disease, alcoholism, or intake of three or more alcohol drinks per day - Only if approved by provider during pregnancy or when breastfeeding - During pregnancy, acetaminophen should not be taken more than 3 consecutive days without telling provider - Do not exceed recommended dose or  frequency ~ Vaccination / Immunization Advice: - Side effects of vaccination / immunization vary with each type. (See [employer] topic "Immunizations - Adult" for specific information.) - Common mild side effects of a vaccination / immunization may include mild redness, general swelling, soreness or a bruise at the injection site, a mild or moderate fever up to 101.5 F (38.6 C), headache, muscle aches, joint aches or tiredness. These symptoms may last 1 to 3 days. - Call provider if symptoms do not improve with 3 days of home care. - Call EMS 911 if having signs and symptoms of a severe allergic reaction (such as severe difficulty breathing, throat tightness, chest pain, sudden swelling of face, lips or tongue, etc.). Life-threatening reactions from vaccines are very rare. ~ 06/09/2011 6:45:19PM Page 1 of 1 [email or web address]_[email or web address]_[email or web address]

## 2011-06-16 ENCOUNTER — Ambulatory Visit: Payer: 59 | Admitting: Psychology

## 2011-06-24 ENCOUNTER — Encounter: Payer: Self-pay | Admitting: *Deleted

## 2011-06-24 MED ORDER — ATORVASTATIN CALCIUM 40 MG PO TABS: 40.0000 mg | ORAL_TABLET | Freq: Every day | ORAL | Status: DC

## 2011-07-13 ENCOUNTER — Ambulatory Visit: Payer: 59 | Admitting: Internal Medicine

## 2011-08-04 ENCOUNTER — Ambulatory Visit (INDEPENDENT_AMBULATORY_CARE_PROVIDER_SITE_OTHER): Payer: 59 | Admitting: Internal Medicine

## 2011-08-04 ENCOUNTER — Encounter: Payer: Self-pay | Admitting: Internal Medicine

## 2011-08-04 VITALS — BP 160/80 | HR 82 | Temp 97.9°F | Ht 63.0 in | Wt 200.0 lb

## 2011-08-04 DIAGNOSIS — F4321 Adjustment disorder with depressed mood: Secondary | ICD-10-CM

## 2011-08-04 MED ORDER — LORAZEPAM 0.5 MG PO TABS: 0.2500 mg | ORAL_TABLET | Freq: Two times a day (BID) | ORAL | Status: DC | PRN

## 2011-08-04 NOTE — Progress Notes (Signed)
  Subjective:    Patient ID: Tami Jones, female    DOB: 1955/12/01, 56 y.o.   MRN: [id number]  HPI Not doing too good "nerves are tore up" Husband left 4 weeks ago Hasn't really talked to him and doesn't really want to  Never got in with Dr Laymond Purser Now doesn't want to go see her  Just can't sleep Mind is going and can't get it to stop Occ crying spell at work  Not depressed everyday Still enjoys church, friends, family  Current Outpatient Prescriptions on File Prior to Visit  Medication Sig Dispense Refill  . aspirin 81 MG tablet Take 81 mg by mouth daily.        Marland Kitchen atorvastatin (LIPITOR) 40 MG tablet Take 1 tablet (40 mg total) by mouth daily.  90 tablet  3  . glipiZIDE (GLUCOTROL XL) 5 MG 24 hr tablet Take 1 tablet (5 mg total) by mouth daily.  90 tablet  3  . lisinopril-hydrochlorothiazide (PRINZIDE,ZESTORETIC) 20-25 MG per tablet Take 1 tablet by mouth 2 (two) times daily.  180 tablet  3  . metoprolol (TOPROL-XL) 50 MG 24 hr tablet Take 1 tablet (50 mg total) by mouth daily.  90 tablet  3  . senna-docusate (SENNA S) 8.6-50 MG per tablet Take 1-2 tablets by mouth daily. For constipation        Allergies  Allergen Reactions  . Doxycycline Hyclate     REACTION: stomach upset  . Montelukast Sodium     REACTION: unspecified  . Nitrofurantoin     REACTION: unspecified  . Sulfa Antibiotics Nausea Only  . Tramadol Hcl     REACTION: Can not tolerate    Past Medical History  Diagnosis Date  . Diabetes mellitus   . GERD (gastroesophageal reflux disease)   . Hyperlipidemia   . CVA (cerebral vascular accident) 03/2006    right occipital, Dr. Thad Ranger  . Hypertension     Past Surgical History  Procedure Date  . Cesarean section   . Tonsillectomy and adenoidectomy   . Esophagogastroduodenoscopy 05/2005    No family history on file.  History   Social History  . Marital Status: Legally Separated    Spouse Name: N/A    Number of Children: 2  . Years of Education:  N/A   Occupational History  . LAB [employer]    Social History Main Topics  . Smoking status: Never Smoker   . Smokeless tobacco: Never Used  . Alcohol Use: No  . Drug Use: Not on file  . Sexually Active: Not on file   Other Topics Concern  . Not on file   Social History Narrative  . No narrative on file   Review of Systems Having trouble focusing Eating is off with her being upset--weight down slightly    Objective:   Physical Exam  Psychiatric: Thought content normal.       Upset but appropriate affect           Assessment & Plan:

## 2011-08-04 NOTE — Assessment & Plan Note (Signed)
Having episodic mood issues since seperation Mostly having sleep problems Sometimes might need something during day  Will try lorazepam

## 2011-09-01 ENCOUNTER — Other Ambulatory Visit: Payer: Self-pay

## 2011-09-01 MED ORDER — LORAZEPAM 0.5 MG PO TABS: ORAL_TABLET | ORAL | Status: DC

## 2011-09-01 NOTE — Telephone Encounter (Signed)
rx called into pharmacy

## 2011-09-01 NOTE — Telephone Encounter (Signed)
Pt request refill lorazepam; pt has one pill left. Pt presently taking 1 tab in AM and 2 at bedtime. This helps anxiety and helps pt sleep. Taking one at bedtime did not help sleep. [facility] [address]Please advise.

## 2011-09-01 NOTE — Telephone Encounter (Signed)
Okay to change sig #90 x 0

## 2011-10-04 ENCOUNTER — Ambulatory Visit (INDEPENDENT_AMBULATORY_CARE_PROVIDER_SITE_OTHER): Payer: 59 | Admitting: Family Medicine

## 2011-10-04 ENCOUNTER — Ambulatory Visit (INDEPENDENT_AMBULATORY_CARE_PROVIDER_SITE_OTHER)
Admission: RE | Admit: 2011-10-04 | Discharge: 2011-10-04 | Disposition: A | Discharge status: Home / Self Care | Payer: 59 | Source: Ambulatory Visit | Attending: Family Medicine | Admitting: Family Medicine

## 2011-10-04 ENCOUNTER — Encounter: Payer: Self-pay | Admitting: Family Medicine

## 2011-10-04 VITALS — BP 150/82 | HR 72 | Temp 98.9°F | Wt 193.5 lb

## 2011-10-04 DIAGNOSIS — M25572 Pain in left ankle and joints of left foot: Secondary | ICD-10-CM

## 2011-10-04 DIAGNOSIS — M25579 Pain in unspecified ankle and joints of unspecified foot: Secondary | ICD-10-CM

## 2011-10-04 MED ORDER — HYDROCODONE-ACETAMINOPHEN 5-500 MG PO TABS: 1.0000 | ORAL_TABLET | Freq: Four times a day (QID) | ORAL | Status: AC | PRN

## 2011-10-04 NOTE — Patient Instructions (Signed)
F/u 3 weeks

## 2011-10-04 NOTE — Progress Notes (Signed)
Nature conservation officer at [facility] [address] [place] [address] Phone: [phone] Fax: [phone]  Date:  10/04/2011   Name:  Tami Jones   DOB:  Mar 17, 1955   MRN:  [id number] Gender: female  Age: 56 y.o.  PCP:  Tillman Abide, MD    Chief Complaint: Foot Swelling   History of Present Illness:  Tami Jones is a 55 y.o. very pleasant female patient who presents with the following:  The patient was at [facility] on Saturday, and she reports having her LEFT foot go underneath her and slipped and inverted her LEFT foot. Now she is having lateral ankle pain. Some significant swelling. She's also having some medial paresthesias. No prior traumatic fracture about her ankle. She has injured it before.  She is able to bear weight, but with some difficulty.  Past Medical History, Surgical History, Social History, Family History, Problem List, Medications, and Allergies have been reviewed and updated if relevant.  Current Outpatient Prescriptions on File Prior to Visit  Medication Sig Dispense Refill  . aspirin 81 MG tablet Take 81 mg by mouth daily.        Marland Kitchen atorvastatin (LIPITOR) 40 MG tablet Take 1 tablet (40 mg total) by mouth daily.  90 tablet  3  . glipiZIDE (GLUCOTROL XL) 5 MG 24 hr tablet Take 1 tablet (5 mg total) by mouth daily.  90 tablet  3  . lisinopril-hydrochlorothiazide (PRINZIDE,ZESTORETIC) 20-25 MG per tablet Take 1 tablet by mouth 2 (two) times daily.  180 tablet  3  . LORazepam (ATIVAN) 0.5 MG tablet Take 1 tablet by mouth in the morning and 2 tablets by mouth at bedtime  90 tablet  0  . metoprolol (TOPROL-XL) 50 MG 24 hr tablet Take 1 tablet (50 mg total) by mouth daily.  90 tablet  3  . senna-docusate (SENNA S) 8.6-50 MG per tablet Take 1-2 tablets by mouth daily as needed. For constipation        Review of Systems:  GEN: No fevers, chills. Nontoxic. Primarily MSK c/o today. MSK: Detailed in the HPI GI: tolerating PO intake without difficulty Neuro:  detailed above Otherwise the pertinent positives of the ROS are noted above.    Physical Examination: Filed Vitals:   10/04/11 1610  BP: 150/82  Pulse: 72  Temp: 98.9 F (37.2 C)   Filed Vitals:   10/04/11 1610  Weight: 193 lb 8 oz (87.771 kg)   There is no height on file to calculate BMI. Ideal Body Weight:     GEN: Well-developed,well-nourished,in no acute distress; alert,appropriate and cooperative throughout examination HEENT: Normocephalic and atraumatic without obvious abnormalities. Ears, externally no deformities PULM: Breathing comfortably in no respiratory distress EXT: No clubbing, cyanosis, or edema PSYCH: Normally interactive. Cooperative during the interview. Pleasant. Friendly and conversant. Not anxious or depressed appearing. Normal, full affect.  ANKLE: L Echymosis: no Edema: lateral ROM: Full dorsi and plantar flexion, inversion, eversion: moderate restriction Gait: significantly antalgic Lateral Mall: TTP Medial Mall: NT Talus: NT Navicular: NT Cuboid: NT Calcaneous: NT Metatarsals: NT 5th MT: NT Phalanges: NT Achilles: NT Plantar Fascia: NT Fat Pad: NT Peroneals: NT Post Tib: NT Great Toe: Nml motion Ant Drawer: neg Talar Tilt: neg ATFL: TTP CFL: TTP Deltoid: NT Str: 4+/5, eversion Other Special tests: none Sensation: intact  Dg Ankle Complete Left  10/04/2011  *RADIOLOGY REPORT*  Clinical Data: Left ankle injury yesterday.  LEFT ANKLE COMPLETE - 3+ VIEW  Comparison: None.  Findings: The soft tissues surrounding  the ankle appear diffusely prominent and may be swollen.  There is no evidence of acute fracture or dislocation.  Mineralization and alignment are normal. The joint spaces are preserved.  There is a small plantar calcaneal spur.  IMPRESSION: No acute osseous findings.  Possible mild soft tissue swelling surrounding the ankle.   Original Report Authenticated By: Gerrianne Scale, M.D.     Assessment and Plan:  1. Left ankle pain   DG Ankle Complete Left   ATFL and CFL ligament sprain. No evidence of occult fracture.  The patient was placed in a Cam Walker boot. She was also given crutches and made nonweightbearing for one week. And she will begin to toe-touch weight-bear and progress as tolerated. I will recheck her in 3 weeks.  Ice for the next several days Tylenol or vicodin if severe pain  Orders Today:  Orders Placed This Encounter  Procedures  . DG Ankle Complete Left    Standing Status: Future     Number of Occurrences: 1     Standing Expiration Date: 12/03/2012    Order Specific Question:  Preferred imaging location?    Answer:  [facility]    Order Specific Question:  Reason for exam:    Answer:  left ankle trauma, 10/04/2011    Medications Today: (Includes new updates added during medication reconciliation) Meds ordered this encounter  Medications  . acetaminophen (TYLENOL) 500 MG tablet    Sig: Take 500 mg by mouth every 6 (six) hours as needed.  Marland Kitchen HYDROcodone-acetaminophen (VICODIN) 5-500 MG per tablet    Sig: Take 1 tablet by mouth every 6 (six) hours as needed for pain.    Dispense:  20 tablet    Refill:  0    Medications Discontinued: There are no discontinued medications.   Hannah Beat, MD

## 2011-10-05 ENCOUNTER — Ambulatory Visit: Payer: 59 | Admitting: Internal Medicine

## 2011-10-05 DIAGNOSIS — Z0289 Encounter for other administrative examinations: Secondary | ICD-10-CM

## 2011-10-20 ENCOUNTER — Ambulatory Visit (INDEPENDENT_AMBULATORY_CARE_PROVIDER_SITE_OTHER): Payer: 59 | Admitting: Internal Medicine

## 2011-10-20 ENCOUNTER — Encounter: Payer: Self-pay | Admitting: Internal Medicine

## 2011-10-20 VITALS — BP 158/90 | HR 68 | Temp 98.1°F | Ht 63.0 in | Wt 193.0 lb

## 2011-10-20 DIAGNOSIS — F4321 Adjustment disorder with depressed mood: Secondary | ICD-10-CM

## 2011-10-20 MED ORDER — TRAZODONE HCL 50 MG PO TABS: 50.0000 mg | ORAL_TABLET | Freq: Every day | ORAL | Status: DC

## 2011-10-20 MED ORDER — LORAZEPAM 0.5 MG PO TABS: 0.2500 mg | ORAL_TABLET | Freq: Two times a day (BID) | ORAL | Status: DC | PRN

## 2011-10-20 NOTE — Progress Notes (Signed)
  Subjective:    Patient ID: Tami Jones, female    DOB: 03-29-1955, 56 y.o.   MRN: [id number]  HPI Still having trouble sleeping "My mind just won't stop" Lorazepam --even 2--won't keep her asleep for more than 2-4 hours. 2 will make her groggy and she almost "fell out" in the shower (she had taken it before showering"  "A lot on me" Not really depressed--just stressed with lawyer, etc Not anhedonic---enjoys time with friends (and goes out of her way to go visit daughter or go out on weekends) No thoughts of dying or suicide Going to work every day  Current Outpatient Prescriptions on File Prior to Visit  Medication Sig Dispense Refill  . acetaminophen (TYLENOL) 500 MG tablet Take 500 mg by mouth every 6 (six) hours as needed.      Marland Kitchen aspirin 81 MG tablet Take 81 mg by mouth daily.        Marland Kitchen atorvastatin (LIPITOR) 40 MG tablet Take 1 tablet (40 mg total) by mouth daily.  90 tablet  3  . glipiZIDE (GLUCOTROL XL) 5 MG 24 hr tablet Take 1 tablet (5 mg total) by mouth daily.  90 tablet  3  . lisinopril-hydrochlorothiazide (PRINZIDE,ZESTORETIC) 20-25 MG per tablet Take 1 tablet by mouth 2 (two) times daily.  180 tablet  3    Allergies  Allergen Reactions  . Doxycycline Hyclate     REACTION: stomach upset  . Montelukast Sodium     REACTION: unspecified  . Nitrofurantoin     REACTION: unspecified  . Sulfa Antibiotics Nausea Only  . Tramadol Hcl     REACTION: Can not tolerate    Past Medical History  Diagnosis Date  . Diabetes mellitus   . GERD (gastroesophageal reflux disease)   . Hyperlipidemia   . CVA (cerebral vascular accident) 03/2006    right occipital, Dr. Thad Ranger  . Hypertension     Past Surgical History  Procedure Date  . Cesarean section   . Tonsillectomy and adenoidectomy   . Esophagogastroduodenoscopy 05/2005    No family history on file.  History   Social History  . Marital Status: Legally Separated    Spouse Name: N/A    Number of Children: 2  .  Years of Education: N/A   Occupational History  . LAB [employer]    Social History Main Topics  . Smoking status: Never Smoker   . Smokeless tobacco: Never Used  . Alcohol Use: Yes     wine occassionally  . Drug Use: No  . Sexually Active: Not on file   Other Topics Concern  . Not on file   Social History Narrative  . No narrative on file   Review of Systems Appetite isn't good Has lost some weight    Objective:   Physical Exam  Constitutional: She appears well-developed and well-nourished. No distress.       Looks tired  Psychiatric: Her behavior is normal. Thought content normal.       Not overtly depressed Appropriate affect Looks tired only          Assessment & Plan:

## 2011-10-20 NOTE — Addendum Note (Signed)
Addended by: Sueanne Margarita on: 10/20/2011 08:55 AM   Modules accepted: Orders

## 2011-10-20 NOTE — Patient Instructions (Signed)
Please start the trazodone at 1 tab nightly. If still not sleeping well after 2 nights, go up to 2 tabs (100mg ). Try this for about a week, and if still not sleeping well, go up to 3 tabs (150mg ) at bedtime.

## 2011-10-20 NOTE — Assessment & Plan Note (Signed)
Ongoing No evidence of major depression Just not sleeping Will change the lorazepam to prn Trial trazodone

## 2011-12-07 ENCOUNTER — Ambulatory Visit: Payer: 59 | Admitting: Internal Medicine

## 2012-01-24 ENCOUNTER — Ambulatory Visit (INDEPENDENT_AMBULATORY_CARE_PROVIDER_SITE_OTHER)
Admission: RE | Admit: 2012-01-24 | Discharge: 2012-01-24 | Disposition: A | Discharge status: Home / Self Care | Payer: 59 | Source: Ambulatory Visit | Attending: Internal Medicine | Admitting: Internal Medicine

## 2012-01-24 ENCOUNTER — Telehealth: Payer: Self-pay

## 2012-01-24 ENCOUNTER — Other Ambulatory Visit: Payer: Self-pay | Admitting: *Deleted

## 2012-01-24 ENCOUNTER — Ambulatory Visit (INDEPENDENT_AMBULATORY_CARE_PROVIDER_SITE_OTHER): Payer: 59 | Admitting: Internal Medicine

## 2012-01-24 ENCOUNTER — Encounter: Payer: Self-pay | Admitting: Internal Medicine

## 2012-01-24 VITALS — BP 160/100 | HR 86 | Temp 98.0°F | Wt 197.0 lb

## 2012-01-24 DIAGNOSIS — R079 Chest pain, unspecified: Secondary | ICD-10-CM

## 2012-01-24 MED ORDER — HYDROCODONE-ACETAMINOPHEN 5-325 MG PO TABS: 1.0000 | ORAL_TABLET | Freq: Four times a day (QID) | ORAL | Status: DC | PRN

## 2012-01-24 MED ORDER — LORAZEPAM 0.5 MG PO TABS: 0.2500 mg | ORAL_TABLET | Freq: Two times a day (BID) | ORAL | Status: DC | PRN

## 2012-01-24 NOTE — Progress Notes (Signed)
Subjective:    Patient ID: Tami Jones, female    DOB: 05/27/1955, 56 y.o.   MRN: [id number]  HPI Has had chest pain under left breast Radiates to back Started about a week ago Now some along the flank as well Not aware of rash Eased up when she just lied in bed yesterday Worse today at work Burning sensation in front, soreness along flank, then sharp in back  No SOB today--did feel some yesterday Only occ cough---mostly resolved  Notices pain worse with deep breath  Had a cold 2 weeks ago This seems to have gotten better  Uses 2 aspirin--no help Hot showers, heating pad---not helping  Current Outpatient Prescriptions on File Prior to Visit  Medication Sig Dispense Refill  . acetaminophen (TYLENOL) 500 MG tablet Take 500 mg by mouth every 6 (six) hours as needed.      Marland Kitchen aspirin 81 MG tablet Take 81 mg by mouth daily.        Marland Kitchen atorvastatin (LIPITOR) 40 MG tablet Take 1 tablet (40 mg total) by mouth daily.  90 tablet  3  . glipiZIDE (GLUCOTROL XL) 5 MG 24 hr tablet Take 1 tablet (5 mg total) by mouth daily.  90 tablet  3  . lisinopril-hydrochlorothiazide (PRINZIDE,ZESTORETIC) 20-25 MG per tablet Take 1 tablet by mouth 2 (two) times daily.  180 tablet  3  . LORazepam (ATIVAN) 0.5 MG tablet Take 0.5-1 tablets (0.25-0.5 mg total) by mouth 2 (two) times daily as needed.  60 tablet  0  . metoprolol succinate (TOPROL-XL) 50 MG 24 hr tablet Take 50 mg by mouth daily.       . [DISCONTINUED] traZODone (DESYREL) 50 MG tablet Take 1-3 tablets (50-150 mg total) by mouth at bedtime.  90 tablet  2    Allergies  Allergen Reactions  . Doxycycline Hyclate     REACTION: stomach upset  . Montelukast Sodium     REACTION: unspecified  . Nitrofurantoin     REACTION: unspecified  . Sulfa Antibiotics Nausea Only  . Tramadol Hcl     REACTION: Can not tolerate    Past Medical History  Diagnosis Date  . Diabetes mellitus   . GERD (gastroesophageal reflux disease)   . Hyperlipidemia   . CVA  (cerebral vascular accident) 03/2006    right occipital, Dr. Thad Ranger  . Hypertension     Past Surgical History  Procedure Date  . Cesarean section   . Tonsillectomy and adenoidectomy   . Esophagogastroduodenoscopy 05/2005    No family history on file.  History   Social History  . Marital Status: Legally Separated    Spouse Name: N/A    Number of Children: 2  . Years of Education: N/A   Occupational History  . LAB [employer]    Social History Main Topics  . Smoking status: Never Smoker   . Smokeless tobacco: Never Used  . Alcohol Use: Yes     Comment: wine occassionally  . Drug Use: No  . Sexually Active: Not on file   Other Topics Concern  . Not on file   Social History Narrative  . 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 No distress.  Neck: Normal range of motion. Neck supple. No thyromegaly present.  Cardiovascular: Normal rate, regular  rhythm and normal heart sounds.  Exam reveals no gallop.   No murmur heard. Pulmonary/Chest: Effort normal. No respiratory distress. She has no wheezes. She has no rales. She exhibits tenderness.       Tender along the anterior costal cartilage around left T9-10 (and maybe 11)  Lymphadenopathy:    She has no cervical adenopathy.  Skin: No rash noted.       No shingles rash  Psychiatric:       Mild anxiety          Assessment & Plan:

## 2012-01-24 NOTE — Telephone Encounter (Signed)
Pt has SOB and dull pain under left breast goes thru to pts back on and off for one week. Today pain is more severe and stays constantly. Pt had cold sweats one hour ago. Pain level now is 8. Pt said has soreness in lt side of neck but not related to pain in chest. Pt is nauseated but has not vomited; no appetite. No dizziness and no h/a today.No cough and no fever. Pt does not feel anxious or nervous. Pt does not want to go to UC; Dr Alphonsus Sias said he could see pt today at 2:45. If condition changes or worsens pt to call back or go to UC if needed before appt.Pt voiced understanding.

## 2012-01-24 NOTE — Assessment & Plan Note (Signed)
EKG is normal CXR looks normal I suspected shingles due to the type of pain but no rash. ? Costal nerve issue Has been going to gym---?cartilage damage from weights No signs of pneumonia Nothing to suggest coronary ischemia  Reassured Will recommend aleve 1-2 bid for 1-2 weeks Go back to aerobic work but hold off on [employer]

## 2012-01-24 NOTE — Telephone Encounter (Signed)
Will check at appt

## 2012-01-24 NOTE — Patient Instructions (Signed)
Please aleve 1-2 tabs twice a day---and stop the aspirin (other than 1 per day) Go back to the gym but hold off on the weights for the next couple of weeks

## 2012-02-19 ENCOUNTER — Ambulatory Visit (INDEPENDENT_AMBULATORY_CARE_PROVIDER_SITE_OTHER): Payer: 59 | Admitting: Internal Medicine

## 2012-02-19 ENCOUNTER — Encounter: Payer: Self-pay | Admitting: Internal Medicine

## 2012-02-19 VITALS — BP 150/90 | HR 88 | Temp 100.2°F | Resp 20 | Wt 191.8 lb

## 2012-02-19 DIAGNOSIS — J40 Bronchitis, not specified as acute or chronic: Secondary | ICD-10-CM

## 2012-02-19 DIAGNOSIS — R509 Fever, unspecified: Secondary | ICD-10-CM

## 2012-02-19 LAB — POCT INFLUENZA A/B: Influenza A, POC: NEGATIVE

## 2012-02-19 MED ORDER — HYDROCODONE-HOMATROPINE 5-1.5 MG/5ML PO SYRP: 5.0000 mL | ORAL_SOLUTION | Freq: Three times a day (TID) | ORAL | Status: DC | PRN

## 2012-02-19 MED ORDER — AMOXICILLIN 875 MG PO TABS: 875.0000 mg | ORAL_TABLET | Freq: Two times a day (BID) | ORAL | Status: DC

## 2012-02-19 NOTE — Progress Notes (Signed)
Subjective:    Patient ID: Tami Jones, female    DOB: 11-08-1955, 57 y.o.   MRN: [id number]  HPI  57 year old white female with history of type 2 diabetes and hypertension complains of fever and cough x1 week. Fever at home as been as high as 102F. Cough is minimally productive. She also complains of burning sensation left side of her chest when she coughs.  Review of Systems Mild shortness of breath  Past Medical History  Diagnosis Date  . Diabetes mellitus   . GERD (gastroesophageal reflux disease)   . Hyperlipidemia   . CVA (cerebral vascular accident) 03/2006    right occipital, Dr. Thad Ranger  . Hypertension     History   Social History  . Marital Status: Legally Separated    Spouse Name: N/A    Number of Children: 2  . Years of Education: N/A   Occupational History  . LAB [employer]    Social History Main Topics  . Smoking status: Never Smoker   . Smokeless tobacco: Never Used  . Alcohol Use: Yes     Comment: wine occassionally  . Drug Use: No  . Sexually Active: Not on file   Other Topics Concern  . Not on file   Social History Narrative  . No narrative on file    Past Surgical History  Procedure Date  . Cesarean section   . Tonsillectomy and adenoidectomy   . Esophagogastroduodenoscopy 05/2005    No family history on file.  Allergies  Allergen Reactions  . Doxycycline Hyclate     REACTION: stomach upset  . Montelukast Sodium     REACTION: unspecified  . Nitrofurantoin     REACTION: unspecified  . Sulfa Antibiotics Nausea Only  . Tramadol Hcl     REACTION: Can not tolerate    Current Outpatient Prescriptions on File Prior to Visit  Medication Sig Dispense Refill  . acetaminophen (TYLENOL) 500 MG tablet Take 500 mg by mouth every 6 (six) hours as needed.      Marland Kitchen aspirin 81 MG tablet Take 81 mg by mouth daily.        Marland Kitchen atorvastatin (LIPITOR) 40 MG tablet Take 1 tablet (40 mg total) by mouth daily.  90 tablet  3  . glipiZIDE (GLUCOTROL XL) 5 MG  24 hr tablet Take 1 tablet (5 mg total) by mouth daily.  90 tablet  3  . HYDROcodone-acetaminophen (NORCO/VICODIN) 5-325 MG per tablet Take 1 tablet by mouth every 6 (six) hours as needed for pain.  30 tablet  0  . lisinopril-hydrochlorothiazide (PRINZIDE,ZESTORETIC) 20-25 MG per tablet Take 1 tablet by mouth 2 (two) times daily.  180 tablet  3  . LORazepam (ATIVAN) 0.5 MG tablet Take 0.5-1 tablets (0.25-0.5 mg total) by mouth 2 (two) times daily as needed.  60 tablet  0  . metoprolol succinate (TOPROL-XL) 50 MG 24 hr tablet Take 50 mg by mouth daily.       . traZODone (DESYREL) 50 MG tablet Take 50-150 mg by mouth at bedtime.        BP 150/90  Pulse 88  Temp 100.2 F (37.9 C) (Oral)  Resp 20  Wt 191 lb 12.8 oz (87 kg)       Objective:   Physical Exam  Constitutional: She is oriented to person, place, and time. She appears well-developed and well-nourished. No distress.  HENT:  Head: Normocephalic and atraumatic.  Right Ear: External ear normal.  Left Ear: External ear normal.  Oropharyngeal erythema  Cardiovascular: Normal rate, regular rhythm and normal heart sounds.   Pulmonary/Chest: Effort normal.       Slight coarse breath sounds bilaterally  Neurological: She is alert and oriented to person, place, and time. No cranial nerve deficit.  Skin: Skin is warm and dry.  Psychiatric: She has a normal mood and affect. Her behavior is normal.          Assessment & Plan:

## 2012-02-19 NOTE — Assessment & Plan Note (Signed)
57 year old white female with cough and fever. She has coarse breath sounds on exam consistent with bronchitis. Treat with amoxicillin 875 mg twice daily for 10 days.  Use hycodan for cough.  Patient understands to followup with her primary care physician within one week if she's not feeling better.

## 2012-02-19 NOTE — Patient Instructions (Addendum)
Please call our office if your symptoms do not improve or gets worse. Follow up with Dr. Alphonsus Sias within 2 weeks

## 2012-02-21 ENCOUNTER — Telehealth: Payer: Self-pay | Admitting: Internal Medicine

## 2012-02-21 NOTE — Telephone Encounter (Signed)
[facility] Triage Call Report Triage Record Num: [id number] Operator: Craig Guess Patient Name: Tami Jones Call Date & Time: 02/19/2012 6:24:51AM Patient Phone: [phone] PCP: Tillman Abide Patient Gender: Female PCP Fax : [phone] Patient DOB: May 08, 1955 Practice Name[facility] Reason for Call: Caller: Tami Jones/Patient; PCP: Tillman Abide (Family Practice); CB#: [phone]; Call regarding cold and cough. Afebrile. Caller requesting an appointment for this am for cold sxs that began a week ago. She has a productive cough and burning in her lung. Sinus pain and pressure as well. Some mild wheezing. Per Cough Protocol, Sharp fleeting chest pain occurring with deep breath or cough AND not responsive to home care, See Provider within 24 hours. Appointment scheduled for today, Sat 1/4 at 10:00 with Dr Artist Pais at [place] location. Caller is agreeable. Protocol(s) Used: Cough - Adult Recommended Outcome per Protocol: See Provider within 24 hours Reason for Outcome: Sharp, fleeting chest pain only occurring with deep breath or cough AND not responsive to home care Care Advice: ~ 02/19/2012 6:48:08AM Page 1 of 1 [id number]

## 2012-03-28 ENCOUNTER — Ambulatory Visit: Payer: 59 | Admitting: Internal Medicine

## 2012-03-28 DIAGNOSIS — Z0289 Encounter for other administrative examinations: Secondary | ICD-10-CM

## 2012-05-11 ENCOUNTER — Other Ambulatory Visit: Payer: Self-pay | Admitting: Internal Medicine

## 2012-09-15 ENCOUNTER — Ambulatory Visit: Payer: 59 | Admitting: Family Medicine

## 2012-09-18 ENCOUNTER — Ambulatory Visit (INDEPENDENT_AMBULATORY_CARE_PROVIDER_SITE_OTHER): Payer: 59 | Admitting: Family Medicine

## 2012-09-18 ENCOUNTER — Encounter: Payer: Self-pay | Admitting: Family Medicine

## 2012-09-18 VITALS — BP 192/98 | HR 82 | Temp 98.2°F | Wt 192.8 lb

## 2012-09-18 DIAGNOSIS — F411 Generalized anxiety disorder: Secondary | ICD-10-CM

## 2012-09-18 DIAGNOSIS — F438 Other reactions to severe stress: Secondary | ICD-10-CM

## 2012-09-18 DIAGNOSIS — I10 Essential (primary) hypertension: Secondary | ICD-10-CM

## 2012-09-18 MED ORDER — CITALOPRAM HYDROBROMIDE 20 MG PO TABS: 20.0000 mg | ORAL_TABLET | Freq: Every day | ORAL | Status: DC

## 2012-09-18 MED ORDER — LISINOPRIL-HYDROCHLOROTHIAZIDE 20-25 MG PO TABS: 1.0000 | ORAL_TABLET | Freq: Every day | ORAL | Status: DC

## 2012-09-18 MED ORDER — METOPROLOL SUCCINATE ER 50 MG PO TB24: 50.0000 mg | ORAL_TABLET | Freq: Every day | ORAL | Status: DC

## 2012-09-18 MED ORDER — LORAZEPAM 0.5 MG PO TABS: 0.2500 mg | ORAL_TABLET | Freq: Two times a day (BID) | ORAL | Status: DC | PRN

## 2012-09-18 NOTE — Assessment & Plan Note (Signed)
Check BMET today and inc BB in meantime.  She'll call back with update about BP and her situation.

## 2012-09-18 NOTE — Progress Notes (Signed)
Divorce finalized 08/29/12.  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  She has been more tearful at work. Easily upset. Sleep is disrupted.  Went to sleep at 4:30 this AM, up and down most of the night previously.  She is lonely at home.  Living alone, just her dog at home. "I can't cope with things like I used to.  I'm not crazy or depressed, I just get anxious."  No SI/HI.  Trazodone didn't help at all with sleep. She isn't getting much relief with lorazepam.  She didn't want to go see psych MD. "I'm overwhelmed."  She has an EAP at work and we discussed that vs outside counseling.  Off BZD.   Compliant with BP meds but will run out soon.  No CP, no focal neuro change, no BLE edema.   Meds, vitals, and allergies reviewed.   ROS: See HPI.  Otherwise, noncontributory.   GEN: nad, alert and oriented, Tearful, regains composure HEENT: mucous membranes moist NECK: supple w/o LA CV: rrr.   PULM: ctab, no inc wob ABD: soft, +bs EXT: no edema SKIN: no acute rash

## 2012-09-18 NOTE — Patient Instructions (Signed)
See Tami Jones about your referral before you leave today. Go to the lab on the way out.  We'll contact you with your lab report.  Take the lorazepam if needed.  Start the citalopram today.  Take the metoprolol twice a day until your BP is <150/<90.  Call me with an update on your BP in about 2 days.

## 2012-09-18 NOTE — Assessment & Plan Note (Addendum)
Restart BZD, add on citalopram and she'll call back with update.  D/w pt. She has frequent sx and likely needs daily nonBZD med.  Okay for outpatient f/u. Refer for counseling. >25 min spent with face to face with patient, >50% counseling and/or coordinating care.

## 2012-09-19 ENCOUNTER — Telehealth: Payer: Self-pay | Admitting: Internal Medicine

## 2012-09-19 ENCOUNTER — Emergency Department: Payer: Self-pay | Admitting: Emergency Medicine

## 2012-09-19 NOTE — Telephone Encounter (Signed)
Patient Information:  Caller Name: [place]  Phone: [phone]  Patient: Tami Jones, Tami Jones  Gender: Female  DOB: [birth year]  Age: 57 Years  PCP: Crawford Givens Clelia Croft) [facility])  Pregnant: No  Office Follow Up:  Does the office need to follow up with this patient?: No  Instructions For The Office: N/A  RN Note:  Pt started Celexa on 8-5 for Anxiety/Depression after being elevated by Dr Para March on 8-4. Pt having severe nausea w/ upset stomach, "feeling aweful" and tickle in throat.  Allergic Reaction protocol used in [facility], 911 dispo due to symptoms of anaphylaxis, abdominal cramping, nausea and apprehension.  Pt will follow advise.  Symptoms  Reason For Call & Symptoms: ER CALL. Nausea  Reviewed Health History In EMR: N/A  Reviewed Medications In EMR: N/A  Reviewed Allergies In EMR: N/A  Reviewed Surgeries / Procedures: N/A  Date of Onset of Symptoms: 09/19/2012 OB / GYN:  LMP: Unknown  Guideline(s) Used:  No Protocol Available - Sick Adult  Disposition Per Guideline:   Call EMS 911 Now  Reason For Disposition Reached:   Sounds like a life-threatening emergency to the triager  Advice Given:  N/A  Patient Will Follow Care Advice:  YES

## 2012-09-19 NOTE — Telephone Encounter (Signed)
Noted, I'll await the ER notes.

## 2012-09-20 LAB — BASIC METABOLIC PANEL
BUN/Creatinine Ratio: 15 (ref 9–23)
BUN: 15 mg/dL (ref 6–24)
CO2: 28 mmol/L (ref 18–29)
Chloride: 101 mmol/L (ref 97–108)
GFR calc Af Amer: 72 mL/min/{1.73_m2} (ref >59)
Potassium: 4.6 mmol/L (ref 3.5–5.2)
Sodium: 143 mmol/L (ref 134–144)

## 2012-09-22 ENCOUNTER — Other Ambulatory Visit: Payer: Self-pay | Admitting: Family Medicine

## 2012-09-26 ENCOUNTER — Encounter: Payer: Self-pay | Admitting: Radiology

## 2012-09-27 ENCOUNTER — Encounter: Payer: Self-pay | Admitting: Internal Medicine

## 2012-09-27 ENCOUNTER — Ambulatory Visit (INDEPENDENT_AMBULATORY_CARE_PROVIDER_SITE_OTHER): Payer: 59 | Admitting: Internal Medicine

## 2012-09-27 VITALS — BP 128/80 | HR 63 | Temp 97.8°F | Wt 191.0 lb

## 2012-09-27 DIAGNOSIS — F329 Major depressive disorder, single episode, unspecified: Secondary | ICD-10-CM | POA: Insufficient documentation

## 2012-09-27 MED ORDER — VENLAFAXINE HCL ER 75 MG PO CP24: 75.0000 mg | ORAL_CAPSULE | Freq: Every day | ORAL | Status: DC

## 2012-09-27 NOTE — Assessment & Plan Note (Signed)
Fits criteria Anxiety as well Had severe reaction to citalopram---will try venlafaxine in low dose Has appt for counseling with Dr Laymond Purser

## 2012-09-27 NOTE — Progress Notes (Signed)
  Subjective:    Patient ID: Tami Jones, female    DOB: [birth year], 56 y.o.   MRN: [id number]  HPI Divorce is final---except final disposition of assets Feels her stress is from work More to do--- having trouble focusing Trying to get used "to being by myself again" Working 12-16 hour shifts at [employer] have high expectations  Has tried the lorazepam Doesn't really help Anxiety- "like overwhelmed" is daily  Some depression--daily PHQ-9  Of 16  Got nauseated and trouble swallowing after 1 dose of the citalopram BP was up  Current Outpatient Prescriptions on File Prior to Visit  Medication Sig Dispense Refill  . aspirin 81 MG tablet Take 81 mg by mouth daily.        Marland Kitchen lisinopril-hydrochlorothiazide (PRINZIDE,ZESTORETIC) 20-25 MG per tablet Take 1 tablet by mouth daily.  90 tablet  0  . metoprolol succinate (TOPROL-XL) 50 MG 24 hr tablet Take 1 tablet (50 mg total) by mouth daily. Take with or immediately following a meal.  90 tablet  0   No current facility-administered medications on file prior to visit.    Allergies  Allergen Reactions  . Citalopram     intolerant  . Doxycycline Hyclate     REACTION: stomach upset  . Montelukast Sodium     REACTION: unspecified  . Nitrofurantoin     REACTION: unspecified  . Sulfa Antibiotics Nausea Only  . Tramadol Hcl     REACTION: Can not tolerate    Past Medical History  Diagnosis Date  . Diabetes mellitus   . GERD (gastroesophageal reflux disease)   . Hyperlipidemia   . CVA (cerebral vascular accident) 03/2006    right occipital, Dr. Thad Ranger  . Hypertension     Past Surgical History  Procedure Laterality Date  . Cesarean section    . Tonsillectomy and adenoidectomy    . Esophagogastroduodenoscopy  05/2005    No family history on file.  History   Social History  . Marital Status: Legally Separated    Spouse Name: N/A    Number of Children: 2  . Years of Education: N/A   Occupational History  . LAB [employer]     Social History Main Topics  . Smoking status: Never Smoker   . Smokeless tobacco: Never Used  . Alcohol Use: Yes     Comment: wine occassionally  . Drug Use: No  . Sexual Activity: Not on file   Other Topics Concern  . Not on file   Social History Narrative  . No narrative on file   Review of Systems Weight stable Sleep issues    Objective:   Physical Exam  Constitutional: She appears well-developed and well-nourished. No distress.  Psychiatric:  Appears depressed Some psychomotor retardation          Assessment & Plan:

## 2012-10-11 ENCOUNTER — Ambulatory Visit: Payer: 59 | Admitting: Internal Medicine

## 2012-10-11 ENCOUNTER — Ambulatory Visit: Payer: 59 | Admitting: Psychology

## 2012-10-11 DIAGNOSIS — Z0289 Encounter for other administrative examinations: Secondary | ICD-10-CM

## 2012-12-08 ENCOUNTER — Ambulatory Visit (INDEPENDENT_AMBULATORY_CARE_PROVIDER_SITE_OTHER): Payer: 59 | Admitting: Family Medicine

## 2012-12-08 ENCOUNTER — Encounter: Payer: Self-pay | Admitting: Family Medicine

## 2012-12-08 VITALS — BP 182/94 | HR 80 | Temp 98.4°F | Wt 185.5 lb

## 2012-12-08 DIAGNOSIS — N390 Urinary tract infection, site not specified: Secondary | ICD-10-CM

## 2012-12-08 DIAGNOSIS — R300 Dysuria: Secondary | ICD-10-CM

## 2012-12-08 LAB — POCT URINALYSIS DIPSTICK
Ketones, UA: 15
Spec Grav, UA: >=1.03

## 2012-12-08 MED ORDER — CIPROFLOXACIN HCL 250 MG PO TABS: 250.0000 mg | ORAL_TABLET | Freq: Two times a day (BID) | ORAL | Status: DC

## 2012-12-08 NOTE — Progress Notes (Signed)
She couldn't tolerate citalopram or effexor. She is tolerating the situation off those meds.  I talked with her about her situation.  If getting worse, she'll notify Dr. Alphonsus Sias.   Dysuria: yes, burning and frequency.  duration of symptoms:2 days abdominal pain:no fevers:no back pain:some lower back pain vomiting:no  Meds, vitals, and allergies reviewed.   ROS: See HPI.  Otherwise negative.    GEN: nad, alert and oriented HEENT: mucous membranes moist NECK: supple CV: rrr.  PULM: ctab, no inc wob ABD: soft, +bs, suprapubic area mildly tender EXT: no edema SKIN: no acute rash BACK: no CVA pain  Recheck BP 170/90

## 2012-12-08 NOTE — Patient Instructions (Signed)
Drink plenty of water and start the antibiotics today.  We'll contact you with your lab report.  Take care.   

## 2012-12-09 DIAGNOSIS — N390 Urinary tract infection, site not specified: Secondary | ICD-10-CM | POA: Insufficient documentation

## 2012-12-09 NOTE — Assessment & Plan Note (Signed)
She skipped her BP meds. Will restart and notify us if BP not controlled.  She'll f/u with PCP prn.   UTI presumed, nontoxic.  Start cipro, check ucx, f/u prn.  She agrees.

## 2012-12-10 LAB — URINE CULTURE

## 2012-12-11 ENCOUNTER — Other Ambulatory Visit: Payer: Self-pay | Admitting: Family Medicine

## 2012-12-11 MED ORDER — CIPROFLOXACIN HCL 500 MG PO TABS: 500.0000 mg | ORAL_TABLET | Freq: Two times a day (BID) | ORAL | Status: DC

## 2013-02-15 HISTORY — PX: CARDIOVASCULAR STRESS TEST: SHX262

## 2013-03-04 LAB — CBC
HCT: 42.4 % (ref 35.0–47.0)
HGB: 14.4 g/dL (ref 12.0–16.0)
MCH: 31.1 pg (ref 26.0–34.0)
MCHC: 33.9 g/dL (ref 32.0–36.0)
MCV: 92 fL (ref 80–100)
Platelet: 215 10*3/uL (ref 150–440)
RBC: 4.61 10*6/uL (ref 3.80–5.20)
RDW: 12.5 % (ref 11.5–14.5)
WBC: 6.5 10*3/uL (ref 3.6–11.0)

## 2013-03-04 LAB — BASIC METABOLIC PANEL
Anion Gap: 5 — ABNORMAL LOW (ref 7–16)
BUN: 18 mg/dL (ref 7–18)
CALCIUM: 9.2 mg/dL (ref 8.5–10.1)
CHLORIDE: 105 mmol/L (ref 98–107)
CREATININE: 1.02 mg/dL (ref 0.60–1.30)
Co2: 30 mmol/L (ref 21–32)
EGFR (African American): >60
EGFR (Non-African Amer.): >60
GLUCOSE: 87 mg/dL (ref 65–99)
Osmolality: 281 (ref 275–301)
POTASSIUM: 4.1 mmol/L (ref 3.5–5.1)
SODIUM: 140 mmol/L (ref 136–145)

## 2013-03-04 LAB — PRO B NATRIURETIC PEPTIDE: B-Type Natriuretic Peptide: 85 pg/mL (ref 0–125)

## 2013-03-04 LAB — TROPONIN I: TROPONIN-I: 0.04 ng/mL

## 2013-03-05 ENCOUNTER — Observation Stay: Payer: Self-pay | Admitting: Internal Medicine

## 2013-03-05 DIAGNOSIS — R079 Chest pain, unspecified: Secondary | ICD-10-CM

## 2013-03-05 LAB — TROPONIN I: Troponin-I: 0.04 ng/mL

## 2013-03-06 ENCOUNTER — Ambulatory Visit: Payer: 59 | Admitting: Internal Medicine

## 2013-03-08 ENCOUNTER — Ambulatory Visit (INDEPENDENT_AMBULATORY_CARE_PROVIDER_SITE_OTHER): Payer: 59 | Admitting: Internal Medicine

## 2013-03-08 ENCOUNTER — Encounter: Payer: Self-pay | Admitting: Internal Medicine

## 2013-03-08 VITALS — BP 140/70 | HR 74 | Temp 98.4°F | Wt 181.0 lb

## 2013-03-08 DIAGNOSIS — I10 Essential (primary) hypertension: Secondary | ICD-10-CM

## 2013-03-08 DIAGNOSIS — M5431 Sciatica, right side: Secondary | ICD-10-CM | POA: Insufficient documentation

## 2013-03-08 DIAGNOSIS — M5430 Sciatica, unspecified side: Secondary | ICD-10-CM

## 2013-03-08 DIAGNOSIS — F329 Major depressive disorder, single episode, unspecified: Secondary | ICD-10-CM

## 2013-03-08 NOTE — Assessment & Plan Note (Signed)
Was accelerated but back under control No changes needed

## 2013-03-08 NOTE — Progress Notes (Signed)
Pre-visit discussion using our clinic review tool. No additional management support is needed unless otherwise documented below in the visit note.  

## 2013-03-08 NOTE — Assessment & Plan Note (Signed)
Better just doing cardio Discussed restarting weights at lower amount in a few weeks

## 2013-03-08 NOTE — Assessment & Plan Note (Signed)
Improved Okay to hold off on meds

## 2013-03-08 NOTE — Progress Notes (Signed)
Subjective:    Patient ID: Tami Jones, female    DOB: 07/10/1955, 58 y.o.   MRN: [id number]  HPI Reviewed hospital records Went to ER--- could feel her BP was too high 231/112 She notes she hasn't missed doses---but is better now on same meds  Had been lifting weights---has stopped this since being in hospital Feels weak since being in hospital  BP good today Has been okay at home since discharge also No sig chest pain since coming home No SOB Has had some headaches---better today  Depression is still present Has good days and bad days--- about half and half  Pain is in right hip and down to foot Walking regularly  Current Outpatient Prescriptions on File Prior to Visit  Medication Sig Dispense Refill  . aspirin 81 MG tablet Take 81 mg by mouth daily.        Marland Kitchen. lisinopril-hydrochlorothiazide (PRINZIDE,ZESTORETIC) 20-25 MG per tablet Take 1 tablet by mouth daily.  90 tablet  0  . metoprolol succinate (TOPROL-XL) 50 MG 24 hr tablet Take 1 tablet (50 mg total) by mouth daily. Take with or immediately following a meal.  90 tablet  0   No current facility-administered medications on file prior to visit.    Allergies  Allergen Reactions  . Citalopram     intolerant  . Doxycycline Hyclate     REACTION: stomach upset  . Montelukast Sodium     REACTION: unspecified  . Nitrofurantoin     REACTION: unspecified  . Sulfa Antibiotics Nausea Only  . Tramadol Hcl     REACTION: Can not tolerate  . Venlafaxine Hcl     Intolerant    Past Medical History  Diagnosis Date  . Diabetes mellitus   . GERD (gastroesophageal reflux disease)   . Hyperlipidemia   . CVA (cerebral vascular accident) 03/2006    right occipital, Dr. Thad Rangereynolds  . Hypertension     Past Surgical History  Procedure Laterality Date  . Cesarean section    . Tonsillectomy and adenoidectomy    . Esophagogastroduodenoscopy  05/2005    No family history on file.  History   Social History  . Marital  Status: Divorced    Spouse Name: N/A    Number of Children: 2  . Years of Education: N/A   Occupational History  . [employer]    Social History Main Topics  . Smoking status: Never Smoker   . Smokeless tobacco: Never Used  . Alcohol Use: Yes     Comment: wine occassionally  . Drug Use: No  . Sexual Activity: Not on file   Other Topics Concern  . Not on file   Social History Narrative  . No narrative on file   Review of Systems Appetite is not great Has cut back work to 40 hours per week again    Objective:   Physical Exam  Constitutional: She appears well-developed and well-nourished. No distress.  Neck: Normal range of motion. Neck supple. No thyromegaly present.  Cardiovascular: Normal rate, regular rhythm and normal heart sounds.  Exam reveals no gallop.   No murmur heard. Pulmonary/Chest: Effort normal and breath sounds normal. No respiratory distress. She has no wheezes. She has no rales.  Abdominal: Soft. There is no tenderness.  Musculoskeletal: She exhibits no edema and no tenderness.  Lymphadenopathy:    She has no cervical adenopathy.  Neurological:  No leg weakness SLR negative  Psychiatric: She has a normal mood and affect. Her behavior is normal.  Assessment & Plan:

## 2013-03-21 ENCOUNTER — Telehealth: Payer: Self-pay | Admitting: Internal Medicine

## 2013-03-21 NOTE — Telephone Encounter (Signed)
Relevant patient education assigned to patient using Emmi. ° °

## 2013-04-09 ENCOUNTER — Ambulatory Visit (INDEPENDENT_AMBULATORY_CARE_PROVIDER_SITE_OTHER): Payer: 59 | Admitting: Family Medicine

## 2013-04-09 ENCOUNTER — Ambulatory Visit (INDEPENDENT_AMBULATORY_CARE_PROVIDER_SITE_OTHER)
Admission: RE | Admit: 2013-04-09 | Discharge: 2013-04-09 | Disposition: A | Discharge status: Home / Self Care | Payer: 59 | Source: Ambulatory Visit | Attending: Family Medicine | Admitting: Family Medicine

## 2013-04-09 ENCOUNTER — Encounter: Payer: Self-pay | Admitting: Family Medicine

## 2013-04-09 VITALS — BP 203/114 | HR 84 | Temp 97.8°F | Ht 63.0 in | Wt 175.2 lb

## 2013-04-09 DIAGNOSIS — M5416 Radiculopathy, lumbar region: Secondary | ICD-10-CM

## 2013-04-09 DIAGNOSIS — R292 Abnormal reflex: Secondary | ICD-10-CM

## 2013-04-09 DIAGNOSIS — IMO0002 Reserved for concepts with insufficient information to code with codable children: Secondary | ICD-10-CM

## 2013-04-09 DIAGNOSIS — R200 Anesthesia of skin: Secondary | ICD-10-CM

## 2013-04-09 DIAGNOSIS — R5383 Other fatigue: Secondary | ICD-10-CM

## 2013-04-09 DIAGNOSIS — E119 Type 2 diabetes mellitus without complications: Secondary | ICD-10-CM

## 2013-04-09 DIAGNOSIS — R209 Unspecified disturbances of skin sensation: Secondary | ICD-10-CM

## 2013-04-09 DIAGNOSIS — R531 Weakness: Secondary | ICD-10-CM

## 2013-04-09 DIAGNOSIS — R5381 Other malaise: Secondary | ICD-10-CM

## 2013-04-09 MED ORDER — PREDNISONE 20 MG PO TABS: ORAL_TABLET | ORAL | Status: DC

## 2013-04-09 MED ORDER — LIDOCAINE 5 % EX PTCH: 1.0000 | MEDICATED_PATCH | CUTANEOUS | Status: DC

## 2013-04-09 MED ORDER — HYDROCODONE-ACETAMINOPHEN 5-325 MG PO TABS: 0.5000 | ORAL_TABLET | Freq: Four times a day (QID) | ORAL | Status: DC | PRN

## 2013-04-09 NOTE — Progress Notes (Signed)
Patient Name: Tami CornerCynthia Petkus Date of Birth: 12/17/1955 Medical Record Number: [id number]  PCP: Tillman Abideichard Letvak, MD  History of Present Illness:  Tami Jones is a 58 y.o. very pleasant female patient who presents with the following: Back Pain  ongoing for approximately: 4 weeks The patient has had back pain before. Not like this. The back pain is localized into the lumbar spine area, but also R posterior leg radiculopathy and numbness.  No bowel or bladder incontinence. Prior interventions: NSAIDS, heat, ice Physical therapy: No Chiropractic manipulations: No Acupuncture: No Osteopathic manipulation: No Heat or cold: Minimal effect   High BP and went to the ER BP Readings from Last 3 Encounters:  04/09/13 203/114  03/08/13 140/70  12/08/12 182/94  Now she is in acute pain. 180/85 on my check.  Now having pain down from her knee down to her knee. Taking some pain pills and lidocaine patch.   Front of her leg is numb.   Checked clots in the hospital. U/S negative.  1/2 tab of vicodin.  184/85  r leg Medial r foot  r hamstring weakness Decreased reflexes at the knee on the RIGHT slr R  Toothache   Past Medical History, Surgical History, Family History, Medications, Allergies have been reviewed and updated if relevant.  Review of Systems  GEN: No fevers, chills. Nontoxic. Primarily MSK c/o today. MSK: Detailed in the HPI GI: tolerating PO intake without difficulty Neuro: As above  Otherwise the pertinent positives of the ROS are noted above.    Physical Exam  Filed Vitals:   04/09/13 1404  BP: 203/114  Pulse: 84  Temp: 97.8 F (36.6 C)  TempSrc: Oral  Height: 5\' 3"  (1.6 m)  Weight: 175 lb 4 oz (79.493 kg)    Gen: Well-developed,well-nourished,in no acute distress; alert,appropriate and cooperative throughout examination HEENT: Normocephalic and atraumatic without obvious abnormalities.  Ears, externally no deformities Pulm: Breathing comfortably  in no respiratory distress Range of motion at  the waist: Flexion, rotation and lateral bending: SIGNIFICANTLY LIMITED EXT, FORWARD FLEXION TO 45 DEGREES, ROTATION MILDLY IMPAIRED  No echymosis or edema Rises to examination table with MODERATE difficulty  Inspection/Deformity: No abnormality Paraspinus T:  MODERATETENDERNESS  B Ankle Dorsiflexion (L5,4): SOME PAIN, POSSIBLY DECREASED B Great Toe Dorsiflexion (L5,4): 5/5 Heel Walk (L5): PAIN, ? WEAKER ON THE LEFT Toe Walk (S1): WNL Rise/Squat (L4): WNL, mild pain  SENSORY B Medial Foot (L4): ABNORMAL DECREASED ON R MEDIAL FOOT B Dorsum (L5): ABNORMAL AT WEBSPACE B Lateral (S1): WNL Light Touch: ABNORMAL ANTERIOR SHIN Pinprick: ABNORMAL SAME LOCATION AS ABOVE  REFLEXES Knee (L4): MINIMAL TO 1+ ON THE RIGHT COMPARED TO 3+ ON THE RIGHT Ankle (S1): 2+  B SLR, seated: POS ON THE RIGHT AT 40 DEG B SLR, supine: POS AT 10 DEGREES B FABER: neg B Reverse FABER: neg B Greater Troch: NT B Log Roll: neg B Sciatic Notch: NT  Knee:  R Gait: antalgia ROM: 0-120 Effusion: neg Echymosis or edema: none Patellar tendon TTP ANSERINE BURSA TTP Painful PLICA: neg Patellar grind: negative Medial and lateral patellar facet loading: negative medial and lateral joint lines:NT Mcmurray's neg Flexion-pinch neg Varus and valgus stress: stable Lachman: neg Ant and Post drawer: neg Hip abduction, IR, ER: WNL Hip flexion str: 5/5 Hip abd: 5/5 Quad: 5/5 VMO atrophy:No Hamstring concentric and eccentric: 4-/5   Dg Lumbar Spine Complete  04/09/2013   CLINICAL DATA:  lumbar radiculopathy  EXAM: LUMBAR SPINE - COMPLETE 4+ VIEW  COMPARISON:  CT  ABD/PELVIS W CM dated 08/13/2010  FINDINGS: There is no evidence of acute fracture nor dislocation. There is mild dextroscoliosis of the lumbar spine. There is mild disc space narrowing at T12-L1 and T11-12. Endplate hypertrophic spurring is identified as well as endplate sclerosis.  IMPRESSION: Degenerative  disc disease changes within the lower thoracic and upper lumbar spine. No evidence of acute osseous abnormalities. If there is persistent clinical concern further evaluation MRI is recommended.   Electronically Signed   By: Salome Holmes M.D.   On: 04/09/2013 15:47    Assessment and Plan:   Lumbar radiculopathy - Plan: MR Lumbar Spine Wo Contrast, DG Lumbar Spine Complete  Numbness - Plan: MR Lumbar Spine Wo Contrast, DG Lumbar Spine Complete  Weakness - Plan: MR Lumbar Spine Wo Contrast, DG Lumbar Spine Complete  Abnormal reflex - Plan: MR Lumbar Spine Wo Contrast, DG Lumbar Spine Complete  DIABETES MELLITUS, TYPE II   >40 minutes spent in face to face time with patient, >50% spent in counselling or coordination of care: Markedly abnormal exam with notably decreased reflexes on R, decreased sensation on R to pinprick and light touch, as well as weakness. Long conversation regarding potential complications, anatomy. Given multiple neurological findings, recommend MRI of the lumbar spine without contrast to evaluate for cord edema, spinal cord impingement, foraminal nerve impingement. With worsening symptoms and worsening neurological signs per her report, we discussed that not evaluating and treating further could lead to permanent neurological injury.  Disposition depends on MRI.  New Prescriptions   PREDNISONE (DELTASONE) 20 MG TABLET    2 po daily for 1 week, then 1 po daily for 1 week   Orders Placed This Encounter  Procedures  . MR Lumbar Spine Wo Contrast  . DG Lumbar Spine Complete   Patient Instructions:  Clovia Cuff. Markos Theil, MD, CAQ Sports Medicine  [facility] [address] [place] [address] Phone: [phone] Fax: [phone]   Patient's Medications  New Prescriptions   PREDNISONE (DELTASONE) 20 MG TABLET    2 po daily for 1 week, then 1 po daily for 1 week  Previous Medications   ASPIRIN 81 MG TABLET    Take 81 mg by mouth  daily.     LISINOPRIL-HYDROCHLOROTHIAZIDE (PRINZIDE,ZESTORETIC) 20-25 MG PER TABLET    Take 1 tablet by mouth daily.   METOPROLOL SUCCINATE (TOPROL-XL) 50 MG 24 HR TABLET    Take 1 tablet (50 mg total) by mouth daily. Take with or immediately following a meal.  Modified Medications   Modified Medication Previous Medication   HYDROCODONE-ACETAMINOPHEN (NORCO/VICODIN) 5-325 MG PER TABLET HYDROcodone-acetaminophen (NORCO/VICODIN) 5-325 MG per tablet      Take 0.5 tablets by mouth every 6 (six) hours as needed for moderate pain.    Take 0.5 tablets by mouth every 6 (six) hours as needed for moderate pain.   LIDOCAINE (LIDODERM) 5 % lidocaine (LIDODERM) 5 %      Place 1 patch onto the skin daily.    Place 1 patch onto the skin daily.   Discontinued Medications   No medications on file

## 2013-04-09 NOTE — Progress Notes (Signed)
Pre visit review using our clinic review tool, if applicable. No additional management support is needed unless otherwise documented below in the visit note. 

## 2013-04-09 NOTE — Patient Instructions (Signed)
REFERRALS TO SPECIALISTS, SPECIAL TESTS (MRI, CT, ULTRASOUNDS)  GO THE WAITING ROOM AND TELL CHECK IN YOU NEED HELP WITH A REFERRAL. Either MARION or LINDA will help you set it up.  If it is between 1-2 PM they may be at lunch.  After 5 PM, they will likely be at home.  They will call you, so please make sure the office has your correct phone number.  Referrals sometimes can be done same day if urgent, but others can take 2 or 3 days to get an appointment. Starting in 2015, some of the new Medicare insurance plans and Affordable Care Health plans offered on the Exchange take longer for referrals. They have added additional paperwork and steps.  MRI's and CT's can take up to a week for the test. (Emergencies like strokes take precedence. I will tell you if you have an emergency.)   Specialist appointment times vary a great deal, mostly on the specialist's schedule and if they have openings. -- Our office tries to get you in as fast as possible. -- Some specialists have very long wait times. (Example. Dermatology. Usually months) -- If you have a true emergency like new cancer, we work to get you in ASAP.   

## 2013-04-11 DIAGNOSIS — IMO0002 Reserved for concepts with insufficient information to code with codable children: Secondary | ICD-10-CM

## 2013-04-14 ENCOUNTER — Inpatient Hospital Stay
Admission: RE | Admit: 2013-04-14 | Discharge: 2013-04-14 | Disposition: A | Discharge status: Home / Self Care | Payer: 59 | Source: Ambulatory Visit | Attending: Family Medicine | Admitting: Family Medicine

## 2013-04-30 ENCOUNTER — Other Ambulatory Visit: Payer: Self-pay

## 2013-04-30 DIAGNOSIS — R6889 Other general symptoms and signs: Secondary | ICD-10-CM

## 2013-04-30 DIAGNOSIS — M5416 Radiculopathy, lumbar region: Secondary | ICD-10-CM

## 2013-04-30 DIAGNOSIS — R29898 Other symptoms and signs involving the musculoskeletal system: Secondary | ICD-10-CM

## 2013-04-30 DIAGNOSIS — R208 Other disturbances of skin sensation: Secondary | ICD-10-CM

## 2013-04-30 DIAGNOSIS — R292 Abnormal reflex: Secondary | ICD-10-CM

## 2013-04-30 MED ORDER — HYDROCODONE-ACETAMINOPHEN 5-325 MG PO TABS: 0.5000 | ORAL_TABLET | Freq: Four times a day (QID) | ORAL | Status: DC | PRN

## 2013-04-30 MED ORDER — LIDOCAINE 5 % EX PTCH: 1.0000 | MEDICATED_PATCH | CUTANEOUS | Status: DC

## 2013-04-30 NOTE — Telephone Encounter (Signed)
Pt left v/m requesting rx hydrocodone apap and lidoderm patch. Call when ready for pick up. Pt still having pain at right buttock and right knee and lower leg pain; pt had to cancel MRI due to having the flu; pt wants to reschedule MRI. Pt request cb.

## 2013-04-30 NOTE — Telephone Encounter (Signed)
Left message for Alaria that prescriptions Aram Beechamare ready to be picked up at front desk and they are working on getting MRI rescheduled.  Shirlee LimerickMarion or Bonita QuinLinda will be calling her once they have that appointment for her.

## 2013-04-30 NOTE — Telephone Encounter (Signed)
done

## 2013-05-12 ENCOUNTER — Inpatient Hospital Stay: Admission: RE | Admit: 2013-05-12 | Payer: 59 | Source: Ambulatory Visit

## 2013-09-14 ENCOUNTER — Encounter: Payer: 59 | Admitting: Internal Medicine

## 2013-09-14 DIAGNOSIS — Z0289 Encounter for other administrative examinations: Secondary | ICD-10-CM

## 2013-10-20 ENCOUNTER — Ambulatory Visit (INDEPENDENT_AMBULATORY_CARE_PROVIDER_SITE_OTHER): Payer: 59 | Admitting: Family Medicine

## 2013-10-20 ENCOUNTER — Other Ambulatory Visit: Payer: Self-pay | Admitting: Family Medicine

## 2013-10-20 ENCOUNTER — Encounter: Payer: Self-pay | Admitting: Family Medicine

## 2013-10-20 VITALS — BP 130/90 | HR 68 | Temp 98.0°F | Ht 63.0 in | Wt 181.2 lb

## 2013-10-20 DIAGNOSIS — Z862 Personal history of diseases of the blood and blood-forming organs and certain disorders involving the immune mechanism: Secondary | ICD-10-CM

## 2013-10-20 DIAGNOSIS — R300 Dysuria: Secondary | ICD-10-CM

## 2013-10-20 DIAGNOSIS — Z8639 Personal history of other endocrine, nutritional and metabolic disease: Secondary | ICD-10-CM

## 2013-10-20 LAB — POCT URINALYSIS DIPSTICK
BILIRUBIN UA: NEGATIVE
Glucose, UA: NEGATIVE
Ketones, UA: NEGATIVE
NITRITE UA: NEGATIVE
PH UA: 6
Protein, UA: NEGATIVE
RBC UA: NEGATIVE
SPEC GRAV UA: 1.015
Urobilinogen, UA: 0.2

## 2013-10-20 LAB — GLUCOSE, FINGERSTICK (STAT): Glucose: 125

## 2013-10-20 NOTE — Progress Notes (Signed)
   Subjective:    Patient ID: Tami Jones, female    DOB: 02-21-1955, 58 y.o.   MRN: [id number]  HPI  Tami Jones is a 58 year old female who comes to the Saturday clinic with a three-day history of dysuria  She's had no fever chills or back pain. No frequency. No blood in her urine. She states her last urinary tract infection was about a year ago.  Review of Systems Review of systems otherwise negative   Review of her allergy list the doxycycline nitroglycerin Furadantin sulfa are not allergic reactions. She says she has GI side effects    Objective:   Physical Exam  Well-developed well-nourished female no acute distress vital signs stable she is afebrile clean-catch urine was normal,,,, because of her history of urinary tract infection the past we will send in a culture      Assessment & Plan:  Symptoms of dysuria with a normal urinalysis,,,,,,,, drink lots of water OTC Pyridium followup on Tuesday with PCP  History of diabetes type 2 off medication because she lost 126 pounds via diet and exercise,,,, blood sugar today is.....125,,,,, random nonfasting at 10 AM in the morning

## 2013-10-20 NOTE — Addendum Note (Signed)
Addended by: Warden Fillers on: 10/20/2013 10:48 AM   Modules accepted: Orders

## 2013-10-20 NOTE — Progress Notes (Signed)
Pre visit review using our clinic review tool, if applicable. No additional management support is needed unless otherwise documented below in the visit note. 

## 2013-10-20 NOTE — Patient Instructions (Signed)
Drink lots of water  , Pyridium,,,, 13 times daily  Call your doctor on Tuesday to get the report on the urine culture  Congratulations on your 126 pounds of weight loss,,,,, keep up the good work

## 2013-10-21 LAB — CULTURE, URINE COMPREHENSIVE
Colony Count: NO GROWTH
ORGANISM ID, BACTERIA: NO GROWTH

## 2013-12-30 ENCOUNTER — Other Ambulatory Visit: Payer: Self-pay | Admitting: Family Medicine

## 2014-01-15 ENCOUNTER — Ambulatory Visit: Payer: 59 | Admitting: Internal Medicine

## 2014-02-28 ENCOUNTER — Ambulatory Visit (INDEPENDENT_AMBULATORY_CARE_PROVIDER_SITE_OTHER): Payer: 59 | Admitting: Internal Medicine

## 2014-02-28 ENCOUNTER — Encounter: Payer: Self-pay | Admitting: Internal Medicine

## 2014-02-28 VITALS — BP 156/90 | HR 78 | Temp 98.2°F | Wt 192.8 lb

## 2014-02-28 DIAGNOSIS — E119 Type 2 diabetes mellitus without complications: Secondary | ICD-10-CM

## 2014-02-28 DIAGNOSIS — I10 Essential (primary) hypertension: Secondary | ICD-10-CM

## 2014-02-28 DIAGNOSIS — M5431 Sciatica, right side: Secondary | ICD-10-CM

## 2014-02-28 DIAGNOSIS — E7800 Pure hypercholesterolemia, unspecified: Secondary | ICD-10-CM

## 2014-02-28 LAB — HM DIABETES FOOT EXAM

## 2014-02-28 MED ORDER — LIDOCAINE 5 % EX PTCH: 1.0000 | MEDICATED_PATCH | CUTANEOUS | Status: DC

## 2014-02-28 MED ORDER — HYDROCODONE-ACETAMINOPHEN 5-325 MG PO TABS: 0.5000 | ORAL_TABLET | Freq: Four times a day (QID) | ORAL | Status: DC | PRN

## 2014-02-28 MED ORDER — METOPROLOL SUCCINATE ER 100 MG PO TB24: 100.0000 mg | ORAL_TABLET | Freq: Every day | ORAL | Status: DC

## 2014-02-28 MED ORDER — LISINOPRIL-HYDROCHLOROTHIAZIDE 20-12.5 MG PO TABS: 2.0000 | ORAL_TABLET | Freq: Every day | ORAL | Status: DC

## 2014-02-28 NOTE — Patient Instructions (Signed)
You can finish out the lisinopril/HCTZ that you have--then start the new prescription. I am doubling the metoprolol to 100mg  a day---you can take 2 of the 50mg  till they run out. The new prescription is for one 100mg  daily.

## 2014-02-28 NOTE — Assessment & Plan Note (Signed)
Ongoing pain but no danger signs Will refill her hydrocodone and lidoderm

## 2014-02-28 NOTE — Progress Notes (Signed)
Subjective:    Patient ID: Tami Jones, female    DOB: 01/19/1956, 59 y.o.   MRN: [id number]  HPI Here for ongoing back pain Never got the MRI---insurance wouldn't cover it  Tries to avoid lifting--but doesn't pick up grandson at times Up and down at work  Ran out of lidoderm--this was helping Hydrocodone helped also --but ran out. Would 1/2 tab daily at bedtime and sometimes before work Pain does radiate down right leg still No leg weakness  Checks BP at times 158/96 last week Some headaches No chest pain No SOB  Doesn't check sugars at all Thinks it is okay though Working a lot of hours Lots of stress Irritable and things bother her--not as depressed Tries to work out some  Current Outpatient Prescriptions on File Prior to Visit  Medication Sig Dispense Refill  . aspirin 81 MG tablet Take 81 mg by mouth daily.      Marland Kitchen. HYDROcodone-acetaminophen (NORCO/VICODIN) 5-325 MG per tablet Take 0.5 tablets by mouth every 6 (six) hours as needed for moderate pain. 30 tablet 0  . lidocaine (LIDODERM) 5 % Place 1 patch onto the skin daily. 30 patch 0  . lisinopril-hydrochlorothiazide (PRINZIDE,ZESTORETIC) 20-25 MG per tablet Take 1 tablet by mouth daily. 90 tablet 0  . metoprolol succinate (TOPROL-XL) 50 MG 24 hr tablet TAKE ONE TABLET BY MOUTH ONCE DAILY, TAKE WITH OR IMMEDIATELY FOLLOWING A MEAL 90 tablet 0   No current facility-administered medications on file prior to visit.    Allergies  Allergen Reactions  . Citalopram     intolerant  . Doxycycline Hyclate     REACTION: stomach upset  . Montelukast Sodium     REACTION: unspecified  . Nitrofurantoin     REACTION: unspecified  . Sulfa Antibiotics Nausea Only  . Tramadol Hcl     REACTION: Can not tolerate  . Venlafaxine Hcl     Intolerant    Past Medical History  Diagnosis Date  . Diabetes mellitus   . GERD (gastroesophageal reflux disease)   . Hyperlipidemia   . CVA (cerebral vascular accident) 03/2006   right occipital, Dr. Thad Rangereynolds  . Hypertension     Past Surgical History  Procedure Laterality Date  . Cesarean section    . Tonsillectomy and adenoidectomy    . Esophagogastroduodenoscopy  05/2005  . Cardiovascular stress test  1/15    myoview. EF 60%    No family history on file.  History   Social History  . Marital Status: Divorced    Spouse Name: N/A    Number of Children: 2  . Years of Education: N/A   Occupational History  . LAB [employer]    Social History Main Topics  . Smoking status: Never Smoker   . Smokeless tobacco: Never Used  . Alcohol Use: Yes     Comment: wine occassionally  . Drug Use: No  . Sexual Activity: Not on file   Other Topics Concern  . Not on file   Social History Narrative   Review of Systems No problems with bowel or bladder Has gained 10# Brother sick--will need liver transplant--- "I have a full plate" Daughter with her husband and baby now living with her    Objective:   Physical Exam  Constitutional: She appears well-developed and well-nourished. No distress.  Neck: Normal range of motion. Neck supple. No thyromegaly present.  Cardiovascular: Normal rate, regular rhythm, normal heart sounds and intact distal pulses.  Exam reveals no gallop.   No  murmur heard. Pulmonary/Chest: Effort normal and breath sounds normal. No respiratory distress. She has no wheezes. She has no rales.  Musculoskeletal: She exhibits no edema or tenderness.  Lymphadenopathy:    She has no cervical adenopathy.  Neurological:  Normal sensation in feet  Skin:  No foot lesions          Assessment & Plan:

## 2014-02-28 NOTE — Progress Notes (Signed)
Pre visit review using our clinic review tool, if applicable. No additional management support is needed unless otherwise documented below in the visit note. 

## 2014-02-28 NOTE — Assessment & Plan Note (Signed)
Will discuss statin next time--- don't want to overwhelm her with changes

## 2014-02-28 NOTE — Addendum Note (Signed)
Addended by: Alvina ChouWALSH, TERRI J on: 02/28/2014 04:56 PM   Modules accepted: Orders

## 2014-02-28 NOTE — Assessment & Plan Note (Signed)
BP Readings from Last 3 Encounters:  02/28/14 156/90  10/20/13 130/90  04/09/13 203/114   Up now Will increase meds  May be related to pain also

## 2014-02-28 NOTE — Assessment & Plan Note (Signed)
Will recheck A1c --not done in some time May want to consider statin--will discuss at next visit

## 2014-03-02 LAB — COMPREHENSIVE METABOLIC PANEL
ALBUMIN: 4.3 g/dL (ref 3.5–5.5)
ALK PHOS: 77 IU/L (ref 39–117)
ALT: 21 IU/L (ref 0–32)
AST: 22 IU/L (ref 0–40)
Albumin/Globulin Ratio: 1.7 (ref 1.1–2.5)
BILIRUBIN TOTAL: 0.4 mg/dL (ref 0.0–1.2)
BUN / CREAT RATIO: 18 (ref 9–23)
BUN: 19 mg/dL (ref 6–24)
CO2: 26 mmol/L (ref 18–29)
CREATININE: 1.06 mg/dL — AB (ref 0.57–1.00)
Calcium: 9.2 mg/dL (ref 8.7–10.2)
Chloride: 103 mmol/L (ref 97–108)
GFR calc Af Amer: 67 mL/min/{1.73_m2} (ref >59)
GFR calc non Af Amer: 58 mL/min/{1.73_m2} — ABNORMAL LOW (ref >59)
GLOBULIN, TOTAL: 2.5 g/dL (ref 1.5–4.5)
GLUCOSE: 95 mg/dL (ref 65–99)
POTASSIUM: 4 mmol/L (ref 3.5–5.2)
Sodium: 144 mmol/L (ref 134–144)
TOTAL PROTEIN: 6.8 g/dL (ref 6.0–8.5)

## 2014-03-02 LAB — CBC WITH DIFFERENTIAL
Basophils Absolute: 0 10*3/uL (ref 0.0–0.2)
Basos: 0 %
EOS ABS: 0.1 10*3/uL (ref 0.0–0.4)
Eos: 1 %
HCT: 41.6 % (ref 34.0–46.6)
Hemoglobin: 13.7 g/dL (ref 11.1–15.9)
IMMATURE GRANS (ABS): 0 10*3/uL (ref 0.0–0.1)
Immature Granulocytes: 0 %
LYMPHS: 39 %
Lymphocytes Absolute: 2.5 10*3/uL (ref 0.7–3.1)
MCH: 30.2 pg (ref 26.6–33.0)
MCHC: 32.9 g/dL (ref 31.5–35.7)
MCV: 92 fL (ref 79–97)
MONOS ABS: 0.4 10*3/uL (ref 0.1–0.9)
Monocytes: 6 %
NEUTROS PCT: 54 %
Neutrophils Absolute: 3.5 10*3/uL (ref 1.4–7.0)
Platelets: 242 10*3/uL (ref 150–379)
RBC: 4.54 x10E6/uL (ref 3.77–5.28)
RDW: 13.5 % (ref 12.3–15.4)
WBC: 6.5 10*3/uL (ref 3.4–10.8)

## 2014-03-02 LAB — LIPID PANEL
Chol/HDL Ratio: 4.3 ratio units (ref 0.0–4.4)
Cholesterol, Total: 231 mg/dL — ABNORMAL HIGH (ref 100–199)
HDL: 54 mg/dL (ref >39)
LDL CALC: 161 mg/dL — AB (ref 0–99)
Triglycerides: 80 mg/dL (ref 0–149)
VLDL CHOLESTEROL CAL: 16 mg/dL (ref 5–40)

## 2014-03-02 LAB — T4, FREE: Free T4: 1.39 ng/dL (ref 0.82–1.77)

## 2014-03-02 LAB — HEMOGLOBIN A1C
Est. average glucose Bld gHb Est-mCnc: 140 mg/dL
Hgb A1c MFr Bld: 6.5 % — ABNORMAL HIGH (ref 4.8–5.6)

## 2014-03-04 ENCOUNTER — Encounter: Payer: Self-pay | Admitting: *Deleted

## 2014-03-19 ENCOUNTER — Telehealth: Payer: Self-pay

## 2014-03-19 ENCOUNTER — Ambulatory Visit (INDEPENDENT_AMBULATORY_CARE_PROVIDER_SITE_OTHER): Payer: 59 | Admitting: Internal Medicine

## 2014-03-19 ENCOUNTER — Encounter: Payer: Self-pay | Admitting: Internal Medicine

## 2014-03-19 VITALS — BP 148/80 | HR 76 | Temp 98.6°F | Wt 187.5 lb

## 2014-03-19 DIAGNOSIS — E7800 Pure hypercholesterolemia, unspecified: Secondary | ICD-10-CM

## 2014-03-19 DIAGNOSIS — J0110 Acute frontal sinusitis, unspecified: Secondary | ICD-10-CM

## 2014-03-19 MED ORDER — ATORVASTATIN CALCIUM 20 MG PO TABS: 20.0000 mg | ORAL_TABLET | Freq: Every day | ORAL | Status: DC

## 2014-03-19 MED ORDER — AMOXICILLIN 500 MG PO TABS: 1000.0000 mg | ORAL_TABLET | Freq: Two times a day (BID) | ORAL | Status: DC

## 2014-03-19 MED ORDER — HYDROCODONE-HOMATROPINE 5-1.5 MG/5ML PO SYRP: 5.0000 mL | ORAL_SOLUTION | Freq: Every evening | ORAL | Status: DC | PRN

## 2014-03-19 NOTE — Telephone Encounter (Signed)
Pt is at [facility] [address] and there is a delay for pt getting hycodan filled since rx was cut; spoke with Sydell Axonegina Laws LPN and pt is to keep the abx rx for few days to see if condition worsens and if so will fill abx but it is OK to fill hycodan rx today. Spoke with Mitzi at [facility] and advised [facility]. Pt notified.

## 2014-03-19 NOTE — Progress Notes (Signed)
   Subjective:    Patient ID: Tami Jones, female    DOB: 11/18/1955, 59 y.o.   MRN: [id number]  HPI Here due to cough  Sick for 4 days or so Ongoing dry cough---though occasional mucus at night Frontal pain Congestion Pain in chest with the cough Some sore throat Slight wheezing-- mild SOB as well (mostly with activity) No ear pain No fever  Tried sudafed--no help Cough is really bothering her  Current Outpatient Prescriptions on File Prior to Visit  Medication Sig Dispense Refill  . aspirin 81 MG tablet Take 81 mg by mouth daily.      Marland Kitchen. HYDROcodone-acetaminophen (NORCO/VICODIN) 5-325 MG per tablet Take 0.5 tablets by mouth every 6 (six) hours as needed for moderate pain. 60 tablet 0  . lidocaine (LIDODERM) 5 % Place 1 patch onto the skin daily. 30 patch 11  . lisinopril-hydrochlorothiazide (ZESTORETIC) 20-12.5 MG per tablet Take 2 tablets by mouth daily. 180 tablet 3  . metoprolol succinate (TOPROL-XL) 100 MG 24 hr tablet Take 1 tablet (100 mg total) by mouth daily. Take with or immediately following a meal. 90 tablet 3   No current facility-administered medications on file prior to visit.    Allergies  Allergen Reactions  . Citalopram     intolerant  . Doxycycline Hyclate     REACTION: stomach upset  . Montelukast Sodium     REACTION: unspecified  . Nitrofurantoin     REACTION: unspecified  . Sulfa Antibiotics Nausea Only  . Tramadol Hcl     REACTION: Can not tolerate  . Venlafaxine Hcl     Intolerant    Past Medical History  Diagnosis Date  . Diabetes mellitus   . GERD (gastroesophageal reflux disease)   . Hyperlipidemia   . CVA (cerebral vascular accident) 03/2006    right occipital, Dr. Thad Rangereynolds  . Hypertension     Past Surgical History  Procedure Laterality Date  . Cesarean section    . Tonsillectomy and adenoidectomy    . Esophagogastroduodenoscopy  05/2005  . Cardiovascular stress test  1/15    myoview. EF 60%    No family history on  file.  History   Social History  . Marital Status: Divorced    Spouse Name: N/A    Number of Children: 2  . Years of Education: N/A   Occupational History  . LAB [employer]    Social History Main Topics  . Smoking status: Never Smoker   . Smokeless tobacco: Never Used  . Alcohol Use: 0.0 oz/week    0 Not specified per week     Comment: wine occassionally  . Drug Use: No  . Sexual Activity: Not on file   Other Topics Concern  . Not on file   Social History Narrative   Review of Systems  No rash  Vomited last night and nausea today Appetite is off     Objective:   Physical Exam  Constitutional: She appears well-developed. No distress.  Looks mildly uncomfortable   HENT:  Mouth/Throat: Oropharynx is clear and moist. No oropharyngeal exudate.  Some frontal pain Marked nasal inflammation TMs normal  Neck: Normal range of motion. Neck supple.  Pulmonary/Chest: Effort normal and breath sounds normal. No respiratory distress. She has no wheezes. She has no rales.  Lymphadenopathy:    She has no cervical adenopathy.  Skin: No rash noted.          Assessment & Plan:

## 2014-03-19 NOTE — Assessment & Plan Note (Signed)
May still be viral Will give cough med Fill amoxil if worsening

## 2014-03-19 NOTE — Patient Instructions (Signed)
Please start the amoxicillin if you are worsening in the next few days--or not better by next week.

## 2014-03-19 NOTE — Assessment & Plan Note (Signed)
Will try atorvastatin

## 2014-03-19 NOTE — Progress Notes (Signed)
Pre visit review using our clinic review tool, if applicable. No additional management support is needed unless otherwise documented below in the visit note. 

## 2014-03-22 ENCOUNTER — Telehealth: Payer: Self-pay | Admitting: Internal Medicine

## 2014-03-22 NOTE — Telephone Encounter (Signed)
Spoke to pt and informed her letter available for pickup. Pt states Dr Alphonsus SiasLetvak did not advise her to stay out of work an additional day.

## 2014-03-22 NOTE — Telephone Encounter (Signed)
Pt needs a note for work for her appointment on 03/19/14.  Pt was also out of work due to illness on 03/20/14.  Please call pt when note is ready to be picked up.  Thanks

## 2014-06-08 NOTE — H&P (Signed)
PATIENT NAME:  Tami Jones, Tami Jones MR#:  [id number] DATE OF BIRTH:  1955-12-12  DATE OF ADMISSION:  03/05/2013  REFERRING PHYSICIAN: Dr. Carollee Massed.  PRIMARY CARE PHYSICIAN: Dr. Alphonsus Sias.   CHIEF COMPLAINT: High blood pressure-like pain.  HISTORY OF PRESENT ILLNESS: A 59 year old Caucasian female with past medical history of hypertension, presenting with right-sided hip pain as well elevated blood pressure. She describes right-sided hip pain for 1 week in duration located over the lateral and medial portion of hip. She describes this as a throbbing in nature, mildly worse at night, no associated trauma,  intensity seven 7 to 8 out of 10. No worsening or relieving factors. She states that she still has pain that radiates distal. She denies any paralysis, paresthesias or further symptoms. She stated she was feeling "funny" today. She took her blood pressure and noticed that she had a blood pressure of greater 200 and decided to come to the Emergency Department for further workup and evaluation. In the ER, blood pressure was indeed elevated. She received 1 dose of IV Lopressor with resolution of her blood pressure. She had transient shortness of breath whenever her blood pressure was elevated, which has since subsequently resolved.   REVIEW OF SYSTEMS: CONSTITUTIONAL: Denies fever, fatigue, weakness. EYES: Denies blurred vision, double vision, eye pain. ENT: Denies tinnitus, ear pain, hearing loss. RESPIRATORY: Denies cough, wheeze or shortness of breath. CARDIOVASCULAR: Denies chest pain, palpitations, edema. GASTROINTESTINAL: Denies nausea, vomiting, diarrhea.  GASTROINTESTINAL: Denies dysuria, hematuria. ENDOCRINE: Denies nocturia or thyroid problems. HEMATOLOGIC: Denies easy bruising or bleeding. SKIN: Denies rash or lesions.  MUSCULOSKELETAL: Positive for pain in right hip as per above; however, denies any neck, back, shoulder or knee pain, arthritic symptoms or swelling. NEUROLOGIC: Denies paralysis,  paresthesias. PSYCHIATRIC: Denies anxiety or depressive symptoms. Otherwise, full review of systems performed by me is negative.   PAST MEDICAL HISTORY: Hypertension as well as diabetes.   ALLERGIES: AUGMENTIN, CITALOPRAM, ERYTHROMYCIN AND SULFA DRUGS.   HOME MEDICATIONS: Include hydrochlorothiazide/lisinopril 25/20 mg p.o. daily and metoprolol 50 mg extended release p.o. daily.   PHYSICAL EXAMINATION:  VITAL SIGNS: Temperature 98.2, heart rate 75, respirations 20, blood pressure on arrival 231/112, currently 175/96, saturating 100% on room air, weight 81.6 kg, BMI 30.9.  GENERAL: Well-nourished, well-developed, Caucasian female currently in no acute distress.  HEENT: Head: Normocephalic, atraumatic. EYES: Pupils equal, round, and reactive to light. Extraocular muscles intact. No scleral icterus. Mouth: Moist mucous membranes. Dentition intact. No abscess noted. Ears, nose and throat: Clear without exudate. No external lesions.  NECK: Supple. No thyromegaly. No nodules. No JVD.  PULMONARY: Clear to auscultation bilaterally. No wheezes, rubs or rhonchi. No use of accessory muscles. Good respiratory effort. Chest sounds are clear to palpation. CARDIOVASCULAR: S1, S2, regular rate and rhythm. No murmurs, rubs, or gallops. No edema. Pedal pulse 2+ bilaterally.  GASTROINTESTINAL: Soft, nontender, nondistended. No masses. Positive bowel sounds. No hepatosplenomegaly.  MUSCULOSKELETAL: No swelling, clubbing, edema. Range of motion full in all extremities. Right hip has no palpable tenderness. No pinpoint tenderness. Full range of motion. No erythema.  NEUROLOGIC: Cranial nerves II through XII intact. No gross focal neurological deficits. Sensation intact. Reflexes intact.  SKIN: No ulcerations, lesions, rashes or cyanosis. Warm, dry, turgor intact.  PSYCHIATRIC: Mood and affect within normal limits. The patient is awake, alert and oriented x 3. Insight and judgment intact.    LABORATORY DATA: Sodium 140,  potassium 41, chloride 105, bicarbonate 30, BUN 18, creatinine 1.02, glucose 87, troponin I less than 0.04. WBC  was 6.5, hemoglobin 14.4, platelets 215. EKG within normal limits. Lower extremity Doppler was performed of the leg, which was negative for DVT. Chest x-ray performed showed no acute cardiopulmonary process. For some reason CTU PE performed revealing no acute PE and no pulmonary findings.   ASSESSMENT AND PLAN: A 59 year old female with history of hypertension, presenting with an elevated blood pressure. 1.  Hypertensive urgency. Blood pressure improved. Restarted home medications including metoprolol and hydrochlorothiazide/lisinopril. She received Lopressor x 1 in the Emergency Department intravenous and subsequently her blood pressure has improved. We will add p.r.n. hydralazine 10 mg intravenous q.4 hours for SBP>180 or DBP>100 2.  Right hip pain, no clear etiology at this time. We will check an x-ray of the hip. Provide pain medication.and benadryl as suspect possible restless leg syndrome.  3.  VTX prophylatic: hep [place]   ____________________________ Tami Athensavid K. Myrtie Leuthold, MD dkh:aw D: 03/05/2013 00:05:00 ET T: 03/05/2013 09:09:20 ET JOB#: [id number]  cc: Tami Athensavid K. Justyne Roell, MD, <Dictator> Jolie Strohecker Synetta ShadowK Javonne Dorko MD ELECTRONICALLY SIGNED 03/06/2013 1:10

## 2014-06-08 NOTE — Discharge Summary (Signed)
PATIENT NAME:  Tami Jones, Derry D MR#:  [id number] DATE OF BIRTH:  02-03-1956  DATE OF ADMISSION:  03/05/2013 DATE OF DISCHARGE:  03/05/2013  ADMITTING DIAGNOSIS: Malignant hypertension.   DISCHARGE DIAGNOSES: 1.  Malignant hypertension.  2.  Chest pain with hypertension. Myoview is negative.  3.  Right sciatic. 4.  History of stroke, according to medical records.   DISCHARGE CONDITION: Stable.   DISCHARGE MEDICATIONS: The patient is to resume: 1.  Metoprolol extended release 50 mg p.o. daily. 2.  Acetaminophen/hydrocodone 325/5 mg 1 tablet every 4 hours as needed. 3.  HCTZ/lisinopril 25/20 mg 1 tablet twice daily, this is a new dose. 4.  Lidocaine topical film 5% to right sacral and buttock area, pain area, once daily.  5.  Aspirin 81 mg p.o. daily.  HOME OXYGEN: None.   DIET: 2 grams salt, low fat, low cholesterol, regular consistency.   ACTIVITY LIMITATIONS: As tolerated.   FOLLOWUP APPOINTMENTS: Dr. Alphonsus SiasLetvak in 2 days after discharge. The patient was also advised to stop lifting weights since it is bad for her back.  CONSULTANTS: Mr. Shaune Spittlerguello who is Dr. Jari Jones's PA, care management, and social work.   RADIOLOGIC STUDIES: Chest portable single view, 18th of January 2015, showed no evidence of active cardiopulmonary disease.   CT scan of chest with IV contrast, 18th of January 2015, showed no evidence of acute pulmonary embolism, no acute pulmonary parenchymal findings.  Right lower extremity ultrasound, 18th of January 2015, showed no evidence of deep vein thrombosis within the right lower extremity.  Right hip complete x-ray, 19th of January 2015, showed no evidence of fracture or dislocation.  Myoview stress test, 19th of January 2015, showed no significant wall motion abnormality. Overall this is low risk scan. Pharmacological myocardial perfusion study with no significant ischemia. The estimated ejection fraction was 60%. The left ventricular global function was normal. There  are no EKG changes concerning for ischemia. There is GI uptake artifact noted in this study.  HISTORY OF PRESENT ILLNESS: The patient is 59 year old Caucasian female who presented to the hospital with complaints of right leg pain radiating from her lower back and was noted to have high blood pressure. She was also complaining of some chest pains. Please refer to Dr. Jarrett Jones's admission note on 19th of January 2015. On arrival to the hospital, the patient's temperature was 98.2, pulse was 75, respiration rate was 20, blood pressure 231/112, and saturation was 100% on room air. Physical examination was remarkable for pain in sacroiliac area radiating down to her right leg. The patient's multiple radiologic studies to evaluate for hip and leg area kind of pains were unremarkable. The patient's EKG done on admission revealed normal sinus rhythm at 67 beats per minute, minimal voltage criteria for LVH, possible anterior infarct, age undetermined, but no significant change since prior EKG done in September 2010.   HOSPITAL COURSE: The patient was patient was admitted to the hospital for further evaluation with diagnosis of hypertensive urgency. She was restarted on home medications including metoprolol as well as HCTZ/lisinopril. She was also given some pain medications. She was re-evaluated later in the day. It was felt that she very likely has sciatica and sciatica related elevation of blood pressure. Of note, the patient's lab studies done in the Emergency Room were unremarkable, including x2 cardiac enzyme testing. Since the patient was complaining of chest pains, which she had earlier on admission, it was felt that it would be prudent to have a stress test done before she is  discharged home. She underwent a stress test, Myoview stress test, which was unremarkable for inducible ischemia and was felt to be stable to be discharged home. On the day of discharge, the patient's vital signs: Temperature was 97.6, pulse  was 59 to 63, respiration rate was 17 to 18, blood pressure 122/63, and saturation was 97% on room air at rest. The patient is to continue blood pressure management with her medications which are advanced. In regards to her sciatica, the patient was given Lidoderm as well as pain medications. She was advised to return back to her primary care physician, Dr. Tillman Jones, in the next few days after discharge and make decisions about adding other pain medications as needed. The patient was also advised to stop lifting weights since it was felt that those weights are responsible for her back pain. The patient is being discharged in stable condition with above-mentioned medications and follow-up.   TIME SPENT: 40 minutes. ____________________________ [name], MD rv:sb D: 03/05/2013 19:29:54 ET T: 03/06/2013 06:59:32 ET JOB#: [id number]  cc: [name], MD, <Dictator> Karie Schwalbe, MD Toye Rouillard Winona Legato MD ELECTRONICALLY SIGNED 03/09/2013 14:23

## 2014-06-25 ENCOUNTER — Ambulatory Visit (INDEPENDENT_AMBULATORY_CARE_PROVIDER_SITE_OTHER): Payer: 59 | Admitting: Internal Medicine

## 2014-06-25 ENCOUNTER — Encounter: Payer: Self-pay | Admitting: Internal Medicine

## 2014-06-25 VITALS — BP 140/80 | HR 63 | Temp 98.0°F | Ht 63.0 in | Wt 193.0 lb

## 2014-06-25 DIAGNOSIS — E7800 Pure hypercholesterolemia, unspecified: Secondary | ICD-10-CM

## 2014-06-25 DIAGNOSIS — E119 Type 2 diabetes mellitus without complications: Secondary | ICD-10-CM | POA: Diagnosis not present

## 2014-06-25 DIAGNOSIS — Z0000 Encounter for general adult medical examination without abnormal findings: Secondary | ICD-10-CM | POA: Diagnosis not present

## 2014-06-25 DIAGNOSIS — R300 Dysuria: Secondary | ICD-10-CM

## 2014-06-25 DIAGNOSIS — N390 Urinary tract infection, site not specified: Secondary | ICD-10-CM | POA: Diagnosis not present

## 2014-06-25 DIAGNOSIS — I10 Essential (primary) hypertension: Secondary | ICD-10-CM

## 2014-06-25 DIAGNOSIS — Z1211 Encounter for screening for malignant neoplasm of colon: Secondary | ICD-10-CM

## 2014-06-25 LAB — POCT URINALYSIS DIPSTICK
Bilirubin, UA: NEGATIVE
GLUCOSE UA: NEGATIVE
Ketones, UA: NEGATIVE
Nitrite, UA: NEGATIVE
Protein, UA: NEGATIVE
RBC UA: NEGATIVE
Spec Grav, UA: >=1.03
UROBILINOGEN UA: NEGATIVE
pH, UA: 6

## 2014-06-25 MED ORDER — CIPROFLOXACIN HCL 250 MG PO TABS
250.0000 mg | ORAL_TABLET | Freq: Two times a day (BID) | ORAL | Status: DC
Start: 2014-06-25 — End: 2014-07-11

## 2014-06-25 NOTE — Patient Instructions (Addendum)
Please set up your screening mammogram. Please start the atorvastatin now! Please start the cipro for the urine infection. If your symptoms go away quickly--like after 1-2 doses--you can take it for only 3 days. Otherwise take the entire 10 days. Pick up the fecal test kit and do it right away

## 2014-06-25 NOTE — Assessment & Plan Note (Signed)
Lab Results  Component Value Date   HGBA1C 6.5* 02/28/2014   Still seems to have good control Will recheck with labs in 6-8 weeks

## 2014-06-25 NOTE — Assessment & Plan Note (Signed)
Not sure if just cystitis or UTI Will treat with cipro 3-10 days depending on response

## 2014-06-25 NOTE — Addendum Note (Signed)
Addended by: Sueanne MargaritaSMITH, DESHANNON L on: 06/25/2014 10:48 AM   Modules accepted: Orders

## 2014-06-25 NOTE — Assessment & Plan Note (Signed)
BP Readings from Last 3 Encounters:  06/25/14 140/80  03/19/14 148/80  02/28/14 156/90   Acceptable control

## 2014-06-25 NOTE — Progress Notes (Signed)
   Subjective:    Patient ID: Tami DurandCynthia D Eimer, female    DOB: 10/10/1955, 59 y.o.   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

## 2014-06-25 NOTE — Progress Notes (Signed)
Subjective:    Patient ID: Tami Jones, female    DOB: 08/10/1955, 59 y.o.   MRN: [id number]  HPI Here for physical  Stress now with brother Liver failure---awaiting transplant but may be too late  Same job--[employer]. Stressful with increased responsibilities and overtime She doesn't like the extra time Trying to work out when she can Is trying to take time off  Checks sugars once a month Generally under 120 No significant hypoglycemic reactions--will get slightly jittery at times and have to eat something Has eye exam coming up No feet sores, numbness or pain  Has had some urinary urgency for 3 days or so Mild nausea No hematuria  No fever  Never started the atorvastatin  Current Outpatient Prescriptions on File Prior to Visit  Medication Sig Dispense Refill  . aspirin 81 MG tablet Take 81 mg by mouth daily.      Marland Kitchen. atorvastatin (LIPITOR) 20 MG tablet Take 1 tablet (20 mg total) by mouth daily. 90 tablet 3  . HYDROcodone-acetaminophen (NORCO/VICODIN) 5-325 MG per tablet Take 0.5 tablets by mouth every 6 (six) hours as needed for moderate pain. 60 tablet 0  . lidocaine (LIDODERM) 5 % Place 1 patch onto the skin daily. 30 patch 11  . lisinopril-hydrochlorothiazide (ZESTORETIC) 20-12.5 MG per tablet Take 2 tablets by mouth daily. 180 tablet 3  . metoprolol succinate (TOPROL-XL) 100 MG 24 hr tablet Take 1 tablet (100 mg total) by mouth daily. Take with or immediately following a meal. 90 tablet 3   No current facility-administered medications on file prior to visit.    Allergies  Allergen Reactions  . Citalopram     intolerant  . Doxycycline Hyclate     REACTION: stomach upset  . Montelukast Sodium     REACTION: unspecified  . Nitrofurantoin     REACTION: unspecified  . Sulfa Antibiotics Nausea Only  . Tramadol Hcl     REACTION: Can not tolerate  . Venlafaxine Hcl     Intolerant    Past Medical History  Diagnosis Date  . Diabetes mellitus   . GERD  (gastroesophageal reflux disease)   . Hyperlipidemia   . CVA (cerebral vascular accident) 03/2006    right occipital, Dr. Thad Rangereynolds  . Hypertension     Past Surgical History  Procedure Laterality Date  . Cesarean section    . Tonsillectomy and adenoidectomy    . Esophagogastroduodenoscopy  05/2005  . Cardiovascular stress test  1/15    myoview. EF 60%    No family history on file.  History   Social History  . Marital Status: Divorced    Spouse Name: N/A  . Number of Children: 2  . Years of Education: N/A   Occupational History  . LAB [employer]    Social History Main Topics  . Smoking status: Never Smoker   . Smokeless tobacco: Never Used  . Alcohol Use: 0.0 oz/week    0 Standard drinks or equivalent per week     Comment: wine occassionally  . Drug Use: No  . Sexual Activity: Not on file   Other Topics Concern  . Not on file   Social History Narrative    Review of Systems  Constitutional: Positive for fatigue. Negative for unexpected weight change.       Wears seat belt Some fatigue but trying to push herself  HENT: Positive for dental problem. Negative for hearing loss and tinnitus.        Dental appt coming up--teeth broken  Eyes: Negative for visual disturbance.  Respiratory: Negative for cough and shortness of breath.   Cardiovascular: Positive for chest pain and palpitations. Negative for leg swelling.       Brief sharp chest pain on left---some tenderness along lateral left breast Palpitations --jumping-- when she lies down. Goes away quickly  Gastrointestinal: Negative for nausea, vomiting, abdominal pain, constipation and blood in stool.  Endocrine: Negative for polydipsia and polyuria.  Genitourinary: Positive for dysuria. Negative for hematuria and dyspareunia.  Musculoskeletal: Positive for back pain. Negative for joint swelling and arthralgias.  Skin: Negative for rash.  Allergic/Immunologic: Positive for environmental allergies. Negative for  immunocompromised state.       Some sinus problems  Neurological: Positive for headaches. Negative for dizziness, syncope, weakness and light-headedness.  Hematological: Negative for adenopathy. Does not bruise/bleed easily.  Psychiatric/Behavioral: Positive for sleep disturbance. Negative for dysphoric mood. The patient is nervous/anxious.        Depression better Anxiety at work       Objective:   Physical Exam  Constitutional: She is oriented to person, place, and time. She appears well-developed and well-nourished. No distress.  HENT:  Head: Normocephalic and atraumatic.  Right Ear: External ear normal.  Left Ear: External ear normal.  Mouth/Throat: Oropharynx is clear and moist. No oropharyngeal exudate.  Eyes: Conjunctivae and EOM are normal. Pupils are equal, round, and reactive to light.  Neck: Normal range of motion. Neck supple.  Cardiovascular: Normal rate, regular rhythm, normal heart sounds and intact distal pulses.  Exam reveals no gallop.   No murmur heard. Pulmonary/Chest: Effort normal and breath sounds normal. No respiratory distress. She has no wheezes. She has no rales.  Abdominal: Soft. There is no tenderness.  Genitourinary:  No breast masses or tenderness Normal introitus and cervix--pap done Mild bladder tenderness with limited bimanual  Musculoskeletal: She exhibits no edema or tenderness.  Lymphadenopathy:    She has no cervical adenopathy.  Neurological: She is alert and oriented to person, place, and time.  Normal sensation in feet  Skin: No rash noted.  No foot lesions  Psychiatric: She has a normal mood and affect. Her behavior is normal.          Assessment & Plan:

## 2014-06-25 NOTE — Assessment & Plan Note (Signed)
She will start the atorvastatin now

## 2014-06-25 NOTE — Progress Notes (Signed)
Pre visit review using our clinic review tool, if applicable. No additional management support is needed unless otherwise documented below in the visit note. 

## 2014-06-25 NOTE — Assessment & Plan Note (Signed)
Pap done today She will set up mammogram Fecal immunoassay Discussed fitness

## 2014-06-28 LAB — PAP LB (LIQUID-BASED): PAP SMEAR COMMENT: 0

## 2014-07-02 LAB — HPV DNA PROBE HIGH RISK, AMPLIFIED: HPV, high-risk: POSITIVE — AB

## 2014-07-02 LAB — SPECIMEN STATUS REPORT

## 2014-07-03 ENCOUNTER — Other Ambulatory Visit: Payer: Self-pay | Admitting: Internal Medicine

## 2014-07-03 DIAGNOSIS — R87610 Atypical squamous cells of undetermined significance on cytologic smear of cervix (ASC-US): Secondary | ICD-10-CM

## 2014-07-10 ENCOUNTER — Emergency Department: Payer: 59

## 2014-07-10 ENCOUNTER — Encounter: Payer: Self-pay | Admitting: Urgent Care

## 2014-07-10 ENCOUNTER — Telehealth: Payer: Self-pay | Admitting: Internal Medicine

## 2014-07-10 ENCOUNTER — Inpatient Hospital Stay: Payer: 59

## 2014-07-10 ENCOUNTER — Inpatient Hospital Stay
Admission: EM | Admit: 2014-07-10 | Discharge: 2014-07-11 | DRG: 305 | Disposition: A | Discharge status: Home / Self Care | Payer: 59 | Attending: Internal Medicine | Admitting: Internal Medicine

## 2014-07-10 DIAGNOSIS — Z882 Allergy status to sulfonamides status: Secondary | ICD-10-CM | POA: Diagnosis not present

## 2014-07-10 DIAGNOSIS — Z6833 Body mass index (BMI) 33.0-33.9, adult: Secondary | ICD-10-CM

## 2014-07-10 DIAGNOSIS — I6381 Other cerebral infarction due to occlusion or stenosis of small artery: Secondary | ICD-10-CM

## 2014-07-10 DIAGNOSIS — Z7982 Long term (current) use of aspirin: Secondary | ICD-10-CM | POA: Diagnosis not present

## 2014-07-10 DIAGNOSIS — E119 Type 2 diabetes mellitus without complications: Secondary | ICD-10-CM | POA: Diagnosis present

## 2014-07-10 DIAGNOSIS — N179 Acute kidney failure, unspecified: Secondary | ICD-10-CM | POA: Diagnosis present

## 2014-07-10 DIAGNOSIS — Z8673 Personal history of transient ischemic attack (TIA), and cerebral infarction without residual deficits: Secondary | ICD-10-CM | POA: Diagnosis not present

## 2014-07-10 DIAGNOSIS — E669 Obesity, unspecified: Secondary | ICD-10-CM | POA: Diagnosis present

## 2014-07-10 DIAGNOSIS — I10 Essential (primary) hypertension: Principal | ICD-10-CM | POA: Diagnosis present

## 2014-07-10 DIAGNOSIS — E785 Hyperlipidemia, unspecified: Secondary | ICD-10-CM | POA: Diagnosis present

## 2014-07-10 DIAGNOSIS — K219 Gastro-esophageal reflux disease without esophagitis: Secondary | ICD-10-CM | POA: Diagnosis present

## 2014-07-10 DIAGNOSIS — I639 Cerebral infarction, unspecified: Secondary | ICD-10-CM | POA: Diagnosis present

## 2014-07-10 LAB — COMPREHENSIVE METABOLIC PANEL
ALBUMIN: 4.6 g/dL (ref 3.5–5.0)
ALK PHOS: 84 U/L (ref 38–126)
ALT: 27 U/L (ref 14–54)
ANION GAP: 10 (ref 5–15)
AST: 26 U/L (ref 15–41)
BILIRUBIN TOTAL: 0.4 mg/dL (ref 0.3–1.2)
BUN: 19 mg/dL (ref 6–20)
CO2: 30 mmol/L (ref 22–32)
Calcium: 9.8 mg/dL (ref 8.9–10.3)
Chloride: 102 mmol/L (ref 101–111)
Creatinine, Ser: 1.03 mg/dL — ABNORMAL HIGH (ref 0.44–1.00)
GFR calc non Af Amer: 59 mL/min — ABNORMAL LOW (ref >60)
Glucose, Bld: 140 mg/dL — ABNORMAL HIGH (ref 65–99)
POTASSIUM: 3.7 mmol/L (ref 3.5–5.1)
Sodium: 142 mmol/L (ref 135–145)
TOTAL PROTEIN: 8.2 g/dL — AB (ref 6.5–8.1)

## 2014-07-10 LAB — CBC
HCT: 43.1 % (ref 35.0–47.0)
Hemoglobin: 14.3 g/dL (ref 12.0–16.0)
MCH: 30.3 pg (ref 26.0–34.0)
MCHC: 33.2 g/dL (ref 32.0–36.0)
MCV: 91.2 fL (ref 80.0–100.0)
PLATELETS: 180 10*3/uL (ref 150–440)
RBC: 4.73 MIL/uL (ref 3.80–5.20)
RDW: 13.3 % (ref 11.5–14.5)
WBC: 6.2 10*3/uL (ref 3.6–11.0)

## 2014-07-10 LAB — GLUCOSE, CAPILLARY
Glucose-Capillary: 138 mg/dL — ABNORMAL HIGH (ref 65–99)
Glucose-Capillary: 154 mg/dL — ABNORMAL HIGH (ref 65–99)

## 2014-07-10 LAB — CK TOTAL AND CKMB (NOT AT ARMC)
CK TOTAL: 137 U/L (ref 38–234)
CK, MB: 7.2 ng/mL — AB (ref 0.5–5.0)
RELATIVE INDEX: 5.3 — AB (ref 0.0–2.5)

## 2014-07-10 LAB — TROPONIN I

## 2014-07-10 LAB — TSH: TSH: 6.648 u[IU]/mL — ABNORMAL HIGH (ref 0.350–4.500)

## 2014-07-10 MED ORDER — CLOPIDOGREL BISULFATE 75 MG PO TABS
75.0000 mg | ORAL_TABLET | Freq: Every day | ORAL | Status: DC
  Administered 2014-07-10 – 2014-07-11 (×2): 75 mg via ORAL
  Filled 2014-07-10: qty 1

## 2014-07-10 MED ORDER — LABETALOL HCL 5 MG/ML IV SOLN
INTRAVENOUS | Status: AC
  Administered 2014-07-10: 10 mg via INTRAVENOUS
  Filled 2014-07-10: qty 4

## 2014-07-10 MED ORDER — LABETALOL HCL 5 MG/ML IV SOLN
10.0000 mg | Freq: Once | INTRAVENOUS | Status: AC
  Administered 2014-07-10: 10 mg via INTRAVENOUS

## 2014-07-10 MED ORDER — LORAZEPAM 1 MG PO TABS
1.0000 mg | ORAL_TABLET | ORAL | Status: AC
  Administered 2014-07-10: 1 mg via ORAL
  Filled 2014-07-10: qty 1

## 2014-07-10 MED ORDER — HEPARIN SODIUM (PORCINE) 5000 UNIT/ML IJ SOLN
5000.0000 [IU] | Freq: Three times a day (TID) | INTRAMUSCULAR | Status: DC
  Administered 2014-07-10 – 2014-07-11 (×3): 5000 [IU] via SUBCUTANEOUS
  Filled 2014-07-10 (×3): qty 1

## 2014-07-10 MED ORDER — ONDANSETRON HCL 4 MG/2ML IJ SOLN
INTRAMUSCULAR | Status: AC
  Administered 2014-07-10: 4 mg via INTRAVENOUS
  Filled 2014-07-10: qty 2

## 2014-07-10 MED ORDER — INSULIN ASPART 100 UNIT/ML ~~LOC~~ SOLN
0.0000 [IU] | Freq: Every day | SUBCUTANEOUS | Status: DC
  Administered 2014-07-10: 3 [IU] via SUBCUTANEOUS
  Filled 2014-07-10: qty 3
  Filled 2014-07-10: qty 2

## 2014-07-10 MED ORDER — LISINOPRIL-HYDROCHLOROTHIAZIDE 20-12.5 MG PO TABS: 2.0000 | ORAL_TABLET | Freq: Every day | ORAL | Status: DC

## 2014-07-10 MED ORDER — HYDROCODONE-ACETAMINOPHEN 5-325 MG PO TABS
0.5000 | ORAL_TABLET | Freq: Four times a day (QID) | ORAL | Status: DC | PRN
  Administered 2014-07-10 – 2014-07-11 (×3): 0.5 via ORAL
  Filled 2014-07-10 (×4): qty 1

## 2014-07-10 MED ORDER — ASPIRIN EC 81 MG PO TBEC
81.0000 mg | DELAYED_RELEASE_TABLET | Freq: Every day | ORAL | Status: DC
  Administered 2014-07-10 – 2014-07-11 (×2): 81 mg via ORAL
  Filled 2014-07-10 (×3): qty 1

## 2014-07-10 MED ORDER — GADOBENATE DIMEGLUMINE 529 MG/ML IV SOLN
18.0000 mL | Freq: Once | INTRAVENOUS | Status: AC | PRN
  Administered 2014-07-10: 18 mL via INTRAVENOUS

## 2014-07-10 MED ORDER — HYDROCHLOROTHIAZIDE 25 MG PO TABS
25.0000 mg | ORAL_TABLET | Freq: Every day | ORAL | Status: DC
  Administered 2014-07-10 – 2014-07-11 (×2): 25 mg via ORAL
  Filled 2014-07-10 (×2): qty 1

## 2014-07-10 MED ORDER — LISINOPRIL 20 MG PO TABS
40.0000 mg | ORAL_TABLET | Freq: Every day | ORAL | Status: DC
  Administered 2014-07-10 – 2014-07-11 (×2): 40 mg via ORAL
  Filled 2014-07-10 (×2): qty 2

## 2014-07-10 MED ORDER — ONDANSETRON HCL 4 MG/2ML IJ SOLN
4.0000 mg | Freq: Four times a day (QID) | INTRAMUSCULAR | Status: DC | PRN
Start: 2014-07-10 — End: 2014-07-11

## 2014-07-10 MED ORDER — IOHEXOL 350 MG/ML SOLN
100.0000 mL | Freq: Once | INTRAVENOUS | Status: AC | PRN
  Administered 2014-07-10: 100 mL via INTRAVENOUS

## 2014-07-10 MED ORDER — SODIUM CHLORIDE 0.9 % IJ SOLN
3.0000 mL | Freq: Two times a day (BID) | INTRAMUSCULAR | Status: DC
  Administered 2014-07-10 – 2014-07-11 (×3): 3 mL via INTRAVENOUS

## 2014-07-10 MED ORDER — DOCUSATE SODIUM 100 MG PO CAPS
100.0000 mg | ORAL_CAPSULE | Freq: Two times a day (BID) | ORAL | Status: DC
  Administered 2014-07-10 – 2014-07-11 (×3): 100 mg via ORAL
  Filled 2014-07-10 (×3): qty 1

## 2014-07-10 MED ORDER — LORAZEPAM 2 MG/ML IJ SOLN
1.0000 mg | Freq: Once | INTRAMUSCULAR | Status: AC
  Administered 2014-07-10: 1 mg via INTRAVENOUS

## 2014-07-10 MED ORDER — ONDANSETRON HCL 4 MG/2ML IJ SOLN
4.0000 mg | Freq: Once | INTRAMUSCULAR | Status: AC
  Administered 2014-07-10: 4 mg via INTRAVENOUS

## 2014-07-10 MED ORDER — ATORVASTATIN CALCIUM 20 MG PO TABS
20.0000 mg | ORAL_TABLET | Freq: Every day | ORAL | Status: DC
  Administered 2014-07-10 – 2014-07-11 (×2): 20 mg via ORAL
  Filled 2014-07-10 (×2): qty 1

## 2014-07-10 MED ORDER — METOPROLOL SUCCINATE ER 100 MG PO TB24
100.0000 mg | ORAL_TABLET | Freq: Every day | ORAL | Status: DC
  Administered 2014-07-10 – 2014-07-11 (×2): 100 mg via ORAL
  Filled 2014-07-10 (×2): qty 1

## 2014-07-10 MED ORDER — ACETAMINOPHEN 325 MG PO TABS
650.0000 mg | ORAL_TABLET | Freq: Four times a day (QID) | ORAL | Status: DC | PRN
  Administered 2014-07-10 – 2014-07-11 (×2): 650 mg via ORAL
  Filled 2014-07-10 (×3): qty 2

## 2014-07-10 MED ORDER — MENTHOL 3 MG MT LOZG
1.0000 | LOZENGE | OROMUCOSAL | Status: DC | PRN
  Administered 2014-07-10: 3 mg via ORAL
  Filled 2014-07-10: qty 9

## 2014-07-10 MED ORDER — ONDANSETRON HCL 4 MG PO TABS: 4.0000 mg | ORAL_TABLET | Freq: Four times a day (QID) | ORAL | Status: DC | PRN

## 2014-07-10 MED ORDER — LORAZEPAM 2 MG/ML IJ SOLN
INTRAMUSCULAR | Status: AC
  Administered 2014-07-10: 1 mg via INTRAVENOUS
  Filled 2014-07-10: qty 1

## 2014-07-10 MED ORDER — INSULIN ASPART 100 UNIT/ML ~~LOC~~ SOLN
0.0000 [IU] | Freq: Three times a day (TID) | SUBCUTANEOUS | Status: DC
  Administered 2014-07-11: 2 [IU] via SUBCUTANEOUS

## 2014-07-10 MED ORDER — ACETAMINOPHEN 650 MG RE SUPP: 650.0000 mg | Freq: Four times a day (QID) | RECTAL | Status: DC | PRN

## 2014-07-10 NOTE — Progress Notes (Signed)
[facility] [place] at [facility]   PATIENT NAME: Tami CornerCynthia Jones    MR#:  [id number]  DATE OF BIRTH:  07/21/1955  SUBJECTIVE:  CHIEF COMPLAINT:  Nausea, tunnel vision During my exam pt is feeling better. Denies nausea, vision is nml, tolerating diet and reports gen weakness  REVIEW OF SYSTEMS:  CONSTITUTIONAL: No fever, fatigue or weakness.  EYES: No blurred or double vision.  EARS, NOSE, AND THROAT: No tinnitus or ear pain.  RESPIRATORY: No cough, shortness of breath, wheezing or hemoptysis.  CARDIOVASCULAR: No chest pain, orthopnea, edema.  GASTROINTESTINAL: No nausea, vomiting, diarrhea or abdominal pain.  GENITOURINARY: No dysuria, hematuria.  ENDOCRINE: No polyuria, nocturia,  HEMATOLOGY: No anemia, easy bruising or bleeding SKIN: No rash or lesion. MUSCULOSKELETAL: No joint pain or arthritis.   NEUROLOGIC: No tingling, numbness, weakness.  PSYCHIATRY: No anxiety or depression.   DRUG ALLERGIES:   Allergies  Allergen Reactions  . Citalopram Other (See Comments)    intolerant  . Doxycycline Hyclate Other (See Comments)    REACTION: stomach upset  . Montelukast Sodium Other (See Comments)    REACTION: unspecified  . Nitrofurantoin Other (See Comments)    REACTION: unspecified  . Sulfa Antibiotics Nausea Only  . Tramadol Hcl Other (See Comments)    REACTION: Can not tolerate  . Venlafaxine Hcl Other (See Comments)    Intolerant    VITALS:  Blood pressure 153/91, pulse 64, temperature 97.9 F (36.6 C), temperature source Oral, resp. rate 18, height 5\' 4"  (1.626 m), weight 84.46 kg (186 lb 3.2 oz), SpO2 100 %.  PHYSICAL EXAMINATION:  GENERAL:  59 y.o.-year-old patient lying in the bed with no acute distress.  EYES: Pupils equal, round, reactive to light and accommodation. No scleral icterus. Extraocular muscles intact.  HEENT: Head atraumatic, normocephalic. Oropharynx and nasopharynx clear.  NECK:  Supple, no jugular venous distention. No  thyroid enlargement, no tenderness.  LUNGS: Normal breath sounds bilaterally, no wheezing, rales,rhonchi or crepitation. No use of accessory muscles of respiration.  CARDIOVASCULAR: S1, S2 normal. No murmurs, rubs, or gallops.  ABDOMEN: Soft, nontender, nondistended. Bowel sounds present. No organomegaly or mass.  EXTREMITIES: No pedal edema, cyanosis, or clubbing.  NEUROLOGIC: Cranial nerves II through XII are intact. Muscle strength 5/5 in all extremities. Sensation intact. Gait not checked. Not past pointing , no pronator drift PSYCHIATRIC: The patient is alert and oriented x 3.  SKIN: No obvious rash, lesion, or ulcer.    LABORATORY PANEL:   CBC  Recent Labs Lab 07/10/14 0256  WBC 6.2  HGB 14.3  HCT 43.1  PLT 180   ------------------------------------------------------------------------------------------------------------------  Chemistries   Recent Labs Lab 07/10/14 0256  NA 142  K 3.7  CL 102  CO2 30  GLUCOSE 140*  BUN 19  CREATININE 1.03*  CALCIUM 9.8  AST 26  ALT 27  ALKPHOS 84  BILITOT 0.4   ------------------------------------------------------------------------------------------------------------------  Cardiac Enzymes  Recent Labs Lab 07/10/14 0256  TROPONINI <0.03   ------------------------------------------------------------------------------------------------------------------  RADIOLOGY:  Ct Angio Head W/cm &/or Wo Cm  07/10/2014   CLINICAL DATA:  Hypertension, not "feeling right " in head. On antibiotics for urinary tract infection. History of stroke, diabetes.  EXAM: CT ANGIOGRAPHY HEAD  TECHNIQUE: Multidetector CT imaging of the head was performed using the standard protocol during bolus administration of intravenous contrast. Multiplanar CT image reconstructions and MIPs were obtained to evaluate the vascular anatomy.  CONTRAST:  100mL OMNIPAQUE IOHEXOL 350 MG/ML SOLN  COMPARISON:  CT head May 09, 2008 and MRI of the brain April 05, 2006   FINDINGS: CT HEAD  The ventricles and sulci are normal. No intraparenchymal hemorrhage, mass effect nor midline shift. No acute large vascular territory infarcts. Mesial bilateral occipital lobe encephalomalacia. Remote bilateral basal ganglia cystic lacunar infarcts. Patchy white matter changes are similar to prior examination though, advanced for age. No abnormal intracranial enhancement.  No abnormal extra-axial fluid collections. Basal cisterns are patent.  No skull fracture. The included ocular globes and orbital contents are non-suspicious. The mastoid aircells and included paranasal sinuses are well-aerated.  CTA HEAD  Anterior circulation: Calcific atherosclerosis of the bilateral carotid bulbs resulting in high-grade stenosis RIGHT proximal supraclinoid internal carotid artery. Moderate stenosis LEFT distal cavernous internal carotid artery. High-grade stenosis origin of the A1 segments bilaterally. Moderate stenosis LEFT M1 origin. Mild luminal irregularity of the mid to distal anterior and middle cerebral artery segments  Posterior circulation: LEFT vertebral artery is dominant. RIGHT vertebral artery predominantly terminates in the posterior inferior cerebellar artery though, tiny occluded distal RIGHT vertebral artery. Normal appearance of the vertebrobasilar junction. High-grade stenosis bilateral P1 origins. Occluded RIGHT P2 segment with minimal reconstitution distally. Occluded distal LEFT P2 segment with minimal reconstitution.  IMPRESSION: No acute intracranial process.  Bilateral occipital lobe/posterior cerebral artery territory infarcts. Remote bilateral basal ganglia lacunar infarcts.  Moderate white matter changes suggest chronic small vessel ischemic disease, advanced for age.  High-grade stenosis bilateral P1 origins, including bilateral P2 segments with minimal reconstitution.  High-grade stenosis RIGHT supraclinoid internal carotid artery, moderate stenosis LEFT distal cavernous internal  carotid artery. High-grade stenosis bilateral A1 origins, moderate stenosis LEFT M1 origin.   Electronically Signed   By: Awilda Metro M.D.   On: 07/10/2014 06:12   Mr Angiogram Neck W Wo Contrast  07/10/2014   CLINICAL DATA:  SHAKING, CONFUSION, ELEVATED BLOOD PRESSURE. History of diabetes. History of hypertension.  EXAM: MRI HEAD WITHOUT AND WITH CONTRAST  MRA EXTRACRANIAL CIRCULATION WITHOUT AND WITH CONTRAST  TECHNIQUE: Multiplanar, multiecho pulse sequences of the brain and surrounding structures were obtained without and with intravenous contrast. Angiographic images of the head were obtained using MRA technique without and with contrast.  CONTRAST:  18mL MULTIHANCE GADOBENATE DIMEGLUMINE 529 MG/ML IV SOLN  COMPARISON:  CT head and CTA head earlier today.  FINDINGS: MRI HEAD FINDINGS  No restricted diffusion, acute or chronic hemorrhage, mass lesion, hydrocephalus, or extra-axial fluid. Normal for age cerebral volume. Moderate T2 and FLAIR hyperintensities throughout the periventricular and subcortical white matter, consistent with chronic microvascular ischemic change. Moderate-sized LEFT and smaller RIGHT basal ganglia infarcts.  In the BILATERAL medial occipital lobes, there is evidence for remote cerebral infarction. T2 and FLAIR hyperintensity surrounds the area of brain substance loss, mimicking vasogenic edema but consistent with gliosis. No posterior circulation edema to suggest PRES.  Post infusion, no abnormal enhancement of brain or meninges. Extracranial soft tissues unremarkable.  MRA EXTRACRANIAL FINDINGS  Conventional branching of the great vessels from the arch. No proximal stenosis. Minor irregularity both carotid bifurcations without flow-limiting stenosis, ulceration, dissection, or fibromuscular change.  Dominant LEFT vertebral provides the sole contribution of the basilar. Diminutive RIGHT vertebral without a significant continuous vessel from the subclavian to the skull base.   IMPRESSION: BILATERAL remote occipital infarcts, remote BILATERAL basal ganglia infarcts, and small vessel disease. No acute intracranial findings.  No extracranial carotid artery disease. Dominant/sole LEFT vertebral contribution to the basilar.   Electronically Signed   By: Davonna Belling M.D.   On:  07/10/2014 12:25   Mr Laqueta Jean ZO Contrast  07/10/2014   CLINICAL DATA:  SHAKING, CONFUSION, ELEVATED BLOOD PRESSURE. History of diabetes. History of hypertension.  EXAM: MRI HEAD WITHOUT AND WITH CONTRAST  MRA EXTRACRANIAL CIRCULATION WITHOUT AND WITH CONTRAST  TECHNIQUE: Multiplanar, multiecho pulse sequences of the brain and surrounding structures were obtained without and with intravenous contrast. Angiographic images of the head were obtained using MRA technique without and with contrast.  CONTRAST:  18mL MULTIHANCE GADOBENATE DIMEGLUMINE 529 MG/ML IV SOLN  COMPARISON:  CT head and CTA head earlier today.  FINDINGS: MRI HEAD FINDINGS  No restricted diffusion, acute or chronic hemorrhage, mass lesion, hydrocephalus, or extra-axial fluid. Normal for age cerebral volume. Moderate T2 and FLAIR hyperintensities throughout the periventricular and subcortical white matter, consistent with chronic microvascular ischemic change. Moderate-sized LEFT and smaller RIGHT basal ganglia infarcts.  In the BILATERAL medial occipital lobes, there is evidence for remote cerebral infarction. T2 and FLAIR hyperintensity surrounds the area of brain substance loss, mimicking vasogenic edema but consistent with gliosis. No posterior circulation edema to suggest PRES.  Post infusion, no abnormal enhancement of brain or meninges. Extracranial soft tissues unremarkable.  MRA EXTRACRANIAL FINDINGS  Conventional branching of the great vessels from the arch. No proximal stenosis. Minor irregularity both carotid bifurcations without flow-limiting stenosis, ulceration, dissection, or fibromuscular change.  Dominant LEFT vertebral provides the  sole contribution of the basilar. Diminutive RIGHT vertebral without a significant continuous vessel from the subclavian to the skull base.  IMPRESSION: BILATERAL remote occipital infarcts, remote BILATERAL basal ganglia infarcts, and small vessel disease. No acute intracranial findings.  No extracranial carotid artery disease. Dominant/sole LEFT vertebral contribution to the basilar.   Electronically Signed   By: Davonna Belling M.D.   On: 07/10/2014 12:25    EKG:   Orders placed or performed during the hospital encounter of 07/10/14  . ED EKG  . ED EKG  . EKG 12-Lead  . EKG 12-Lead    ASSESSMENT AND PLAN:     This is a 59 year old female admitted for malignant hypertension.  1. Hypertension: Uncontrolled.   The patient does have a remote infarct but no new neurologic deficits. Her decreased peripheral vision is an old finding, with remote infarct Plavix added to babay asa for multiple white matter changes on MRI, likely HTN related Echo, FLP and hba1c pending  Appreciate Neurology consut  2. Diabetes mellitus type 2: The patient states DM is diet controlled. She has lost more than 100 pounds over more than a year. Check hemoglobin A1c. Place patient on sliding scale if needed.  3. Hyperlipidemia: Continue statin therapy.FLP am  4. Obesity: BMI 33.5; encourage healthy diet and exercise  5. DVT prophylaxis: Heparin    6. Gen weakness -PT eval   All the records are reviewed and case discussed with Care Management/Social Workerr. Management plans discussed with the patient, family and they are in agreement.  CODE STATUS: full  TOTAL TIME TAKING CARE OF THIS PATIENT: 35 minutes.   POSSIBLE D/C IN 1 DAY, DEPENDING ON CLINICAL CONDITION.   Ramonita Lab M.D on 07/10/2014 at 6:57 PM  Between 7am to 6pm - Pager - [phone] After 6pm go to [email or web address] - password [email or web address] [facility]  [place] [facility]  Office  [phone]  CC: Primary care physician; Tillman Abide, MD

## 2014-07-10 NOTE — H&P (Signed)
Tami Jones is an 59 y.o. female.   Chief Complaint: Nausea HPI: The patient presents to the hospital complaining of nausea and disorientation. She returned home from working second shift at which time she felt her vision become tunnellike in Scientist, research (physical sciences) and she felt very agitated and fidgety. The patient remembers her neck feeling very full and hearing her pulse and her head. She denied any chest pain or shortness of breath at this time but admits to feeling nauseous without vomiting. In the emergency department the patient's systolic blood pressure was found to be greater than 220. Head CT revealed a new relatively remote infarct in addition to narrowing of her internal carotid arteries which prompted the emergency department to call for admission.  Past Medical History  Diagnosis Date  . Diabetes mellitus   . GERD (gastroesophageal reflux disease)   . Hyperlipidemia   . CVA (cerebral vascular accident) 03/2006    right occipital, Dr. Doy Mince  . Hypertension     Past Surgical History  Procedure Laterality Date  . Cesarean section    . Tonsillectomy and adenoidectomy    . Esophagogastroduodenoscopy  05/2005  . Cardiovascular stress test  1/15    myoview. EF 60%    Family History  Problem Relation Age of Onset  . Coronary artery disease Mother   . Diabetes Mellitus II Mother   . Diabetes Mellitus II Maternal Grandmother    Social History:  reports that she has never smoked. She has never used smokeless tobacco. She reports that she does not drink alcohol or use illicit drugs.  Allergies:  Allergies  Allergen Reactions  . Citalopram Other (See Comments)    intolerant  . Doxycycline Hyclate Other (See Comments)    REACTION: stomach upset  . Montelukast Sodium Other (See Comments)    REACTION: unspecified  . Nitrofurantoin Other (See Comments)    REACTION: unspecified  . Sulfa Antibiotics Nausea Only  . Tramadol Hcl Other (See Comments)    REACTION: Can not tolerate  .  Venlafaxine Hcl Other (See Comments)    Intolerant    Prior to Admission medications   Medication Sig Start Date End Date Taking? Authorizing Provider  aspirin 81 MG tablet Take 81 mg by mouth daily.     Yes Historical Provider, MD  atorvastatin (LIPITOR) 20 MG tablet Take 1 tablet (20 mg total) by mouth daily. 03/19/14  Yes Venia Carbon, MD  ciprofloxacin (CIPRO) 250 MG tablet Take 1 tablet (250 mg total) by mouth 2 (two) times daily. 06/25/14  Yes Venia Carbon, MD  HYDROcodone-acetaminophen (NORCO/VICODIN) 5-325 MG per tablet Take 0.5 tablets by mouth every 6 (six) hours as needed for moderate pain. 02/28/14  Yes Venia Carbon, MD  lidocaine (LIDODERM) 5 % Place 1 patch onto the skin daily. 02/28/14  Yes Venia Carbon, MD  lisinopril-hydrochlorothiazide (ZESTORETIC) 20-12.5 MG per tablet Take 2 tablets by mouth daily. 02/28/14  Yes Venia Carbon, MD  metoprolol succinate (TOPROL-XL) 100 MG 24 hr tablet Take 1 tablet (100 mg total) by mouth daily. Take with or immediately following a meal. 02/28/14  Yes Venia Carbon, MD     Results for orders placed or performed during the hospital encounter of 07/10/14 (from the past 48 hour(s))  CBC     Status: None   Collection Time: 07/10/14  2:56 AM  Result Value Ref Range   WBC 6.2 3.6 - 11.0 K/uL   RBC 4.73 3.80 - 5.20 MIL/uL   Hemoglobin 14.3  12.0 - 16.0 g/dL   HCT 43.1 35.0 - 47.0 %   MCV 91.2 80.0 - 100.0 fL   MCH 30.3 26.0 - 34.0 pg   MCHC 33.2 32.0 - 36.0 g/dL   RDW 13.3 11.5 - 14.5 %   Platelets 180 150 - 440 K/uL  Comprehensive metabolic panel     Status: Abnormal   Collection Time: 07/10/14  2:56 AM  Result Value Ref Range   Sodium 142 135 - 145 mmol/L   Potassium 3.7 3.5 - 5.1 mmol/L   Chloride 102 101 - 111 mmol/L   CO2 30 22 - 32 mmol/L   Glucose, Bld 140 (H) 65 - 99 mg/dL   BUN 19 6 - 20 mg/dL   Creatinine, Ser 1.03 (H) 0.44 - 1.00 mg/dL   Calcium 9.8 8.9 - 10.3 mg/dL   Total Protein 8.2 (H) 6.5 - 8.1 g/dL    Albumin 4.6 3.5 - 5.0 g/dL   AST 26 15 - 41 U/L   ALT 27 14 - 54 U/L   Alkaline Phosphatase 84 38 - 126 U/L   Total Bilirubin 0.4 0.3 - 1.2 mg/dL   GFR calc non Af Amer 59 (L) >60 mL/min   GFR calc Af Amer >60 >60 mL/min    Comment: (NOTE) The eGFR has been calculated using the CKD EPI equation. This calculation has not been validated in all clinical situations. eGFR's persistently <60 mL/min signify possible Chronic Kidney Disease.    Anion gap 10 5 - 15  CK total and CKMB (cardiac)     Status: Abnormal   Collection Time: 07/10/14  2:56 AM  Result Value Ref Range   Total CK 137 38 - 234 U/L   CK, MB 7.2 (H) 0.5 - 5.0 ng/mL   Relative Index 5.3 (H) 0.0 - 2.5  Troponin I     Status: None   Collection Time: 07/10/14  2:56 AM  Result Value Ref Range   Troponin I <0.03 <0.031 ng/mL    Comment:        NO INDICATION OF MYOCARDIAL INJURY.    Ct Angio Head W/cm &/or Wo Cm  07/10/2014   CLINICAL DATA:  Hypertension, not "feeling right " in head. On antibiotics for urinary tract infection. History of stroke, diabetes.  EXAM: CT ANGIOGRAPHY HEAD  TECHNIQUE: Multidetector CT imaging of the head was performed using the standard protocol during bolus administration of intravenous contrast. Multiplanar CT image reconstructions and MIPs were obtained to evaluate the vascular anatomy.  CONTRAST:  126m OMNIPAQUE IOHEXOL 350 MG/ML SOLN  COMPARISON:  CT head May 09, 2008 and MRI of the brain April 05, 2006  FINDINGS: CT HEAD  The ventricles and sulci are normal. No intraparenchymal hemorrhage, mass effect nor midline shift. No acute large vascular territory infarcts. Mesial bilateral occipital lobe encephalomalacia. Remote bilateral basal ganglia cystic lacunar infarcts. Patchy white matter changes are similar to prior examination though, advanced for age. No abnormal intracranial enhancement.  No abnormal extra-axial fluid collections. Basal cisterns are patent.  No skull fracture. The included  ocular globes and orbital contents are non-suspicious. The mastoid aircells and included paranasal sinuses are well-aerated.  CTA HEAD  Anterior circulation: Calcific atherosclerosis of the bilateral carotid bulbs resulting in high-grade stenosis RIGHT proximal supraclinoid internal carotid artery. Moderate stenosis LEFT distal cavernous internal carotid artery. High-grade stenosis origin of the A1 segments bilaterally. Moderate stenosis LEFT M1 origin. Mild luminal irregularity of the mid to distal anterior and middle cerebral artery segments  Posterior circulation: LEFT vertebral artery is dominant. RIGHT vertebral artery predominantly terminates in the posterior inferior cerebellar artery though, tiny occluded distal RIGHT vertebral artery. Normal appearance of the vertebrobasilar junction. High-grade stenosis bilateral P1 origins. Occluded RIGHT P2 segment with minimal reconstitution distally. Occluded distal LEFT P2 segment with minimal reconstitution.  IMPRESSION: No acute intracranial process.  Bilateral occipital lobe/posterior cerebral artery territory infarcts. Remote bilateral basal ganglia lacunar infarcts.  Moderate white matter changes suggest chronic small vessel ischemic disease, advanced for age.  High-grade stenosis bilateral P1 origins, including bilateral P2 segments with minimal reconstitution.  High-grade stenosis RIGHT supraclinoid internal carotid artery, moderate stenosis LEFT distal cavernous internal carotid artery. High-grade stenosis bilateral A1 origins, moderate stenosis LEFT M1 origin.   Electronically Signed   By: Elon Alas M.D.   On: 07/10/2014 06:12    Review of Systems  Constitutional: Negative for fever and chills.  HENT: Negative for sore throat and tinnitus.   Eyes: Negative for blurred vision and redness.  Respiratory: Negative for cough and shortness of breath.   Cardiovascular: Negative for chest pain, palpitations, orthopnea and PND.  Gastrointestinal:  Positive for nausea. Negative for vomiting, abdominal pain and diarrhea.  Genitourinary: Negative for dysuria, urgency and frequency.  Musculoskeletal: Negative for myalgias and joint pain.  Skin: Negative for rash.       No lesions  Neurological: Negative for speech change, focal weakness and weakness.  Endo/Heme/Allergies: Does not bruise/bleed easily.       No temperature intolerance  Psychiatric/Behavioral: Negative for depression and suicidal ideas.    Blood pressure 187/94, pulse 85, temperature 98 F (36.7 C), temperature source Oral, resp. rate 15, height _0  (1.626 m), weight 88.451 kg (195 lb), SpO2 100 %. Physical Exam  Vitals reviewed. Constitutional: She is oriented to person, place, and time. She appears well-developed and well-nourished.  HENT:  Head: Normocephalic and atraumatic.  Eyes: EOM are normal. Pupils are equal, round, and reactive to light.  Neck: Normal range of motion. No tracheal deviation present. No thyromegaly present.  Cardiovascular: Normal rate, regular rhythm and normal heart sounds.  Exam reveals no gallop and no friction rub.   No murmur heard. Respiratory: Effort normal and breath sounds normal.  GI: Soft. Bowel sounds are normal. She exhibits no distension. There is no tenderness.  Lymphadenopathy:    She has no cervical adenopathy.  Neurological: She is alert and oriented to person, place, and time. No cranial nerve deficit. She exhibits normal muscle tone.  Skin: Skin is warm and dry.  Psychiatric: She has a normal mood and affect. Judgment and thought content normal.     Assessment/Plan This is a 59 year old female admitted for malignant hypertension. 1. Hypertension: Uncontrolled. Likely secondary to physiologic reaction to narrowing of carotid circulation. The patient does have a remote infarct but no new neurologic deficits. Her decreased peripheral vision is an old finding. Her blood pressure is better but still high which we will  except at this time for intracranial perfusion. I have ordered an MRI of the brain and MRA-cranial vessels. Consult vascular surgery due to carotid narrowing. Neurology consult placed as well for further input. 2. Diabetes mellitus type 2: The patient states that her blood sugar is diet controlled. She has lost more than 100 pounds over more than a year. Check hemoglobin A1c. Place patient on sliding scale if needed. 3. Hyperlipidemia: Continue statin therapy 4. Obesity: BMI 33.5; encourage healthy diet and exercise 5. DVT prophylaxis: Heparin 6. GI prophylaxis: None I  find the patient is full code. Time spent on admission orders and patient care proximally 40 minutes  Harrie Foreman 07/10/2014, 7:49 AM

## 2014-07-10 NOTE — Consult Note (Signed)
CC: generalized weakness   HPI: Tami DurandCynthia D Jones is an 59 y.o. female  presents to the hospital complaining of nausea and disorientation. 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 The patient remembers her neck feeling very full and hearing her pulse and her head. She denied any chest pain or shortness of breath at this time but admits to feeling nauseous without vomiting. In the emergency department the patient's systolic blood pressure was found to be greater than 220. Pt's symptoms improved and she is currently back to baseline.    MRI brain no acute ischemia, but multiple white matter changes on MRI likely HTN related.   Past Medical History  Diagnosis Date  . Diabetes mellitus   . GERD (gastroesophageal reflux disease)   . Hyperlipidemia   . CVA (cerebral vascular accident) 03/2006    right occipital, Dr. Thad Rangereynolds  . Hypertension     Past Surgical History  Procedure Laterality Date  . Cesarean section    . Tonsillectomy and adenoidectomy    . Esophagogastroduodenoscopy  05/2005  . Cardiovascular stress test  1/15    myoview. EF 60%    Family History  Problem Relation Age of Onset  . Coronary artery disease Mother   . Diabetes Mellitus II Mother   . Diabetes Mellitus II Maternal Grandmother     Social History:  reports that she has never smoked. She has never used smokeless tobacco. She reports that she does not drink alcohol or use illicit drugs.  Allergies  Allergen Reactions  . Citalopram Other (See Comments)    intolerant  . Doxycycline Hyclate Other (See Comments)    REACTION: stomach upset  . Montelukast Sodium Other (See Comments)    REACTION: unspecified  . Nitrofurantoin Other (See Comments)    REACTION: unspecified  . Sulfa Antibiotics Nausea Only  . Tramadol Hcl Other (See Comments)    REACTION: Can not tolerate  . Venlafaxine Hcl Other (See  Comments)    Intolerant    Medications: I have reviewed the patient's current medications.  ROS: History obtained from the patient  General ROS: negative for - chills, fatigue, fever, night sweats, weight gain or weight loss Psychological ROS: negative for - behavioral disorder, hallucinations, memory difficulties, mood swings or suicidal ideation Ophthalmic ROS: negative for - blurry vision, double vision, eye pain or loss of vision ENT ROS: negative for - epistaxis, nasal discharge, oral lesions, sore throat, tinnitus or vertigo Allergy and Immunology ROS: negative for - hives or itchy/watery eyes Hematological and Lymphatic ROS: negative for - bleeding problems, bruising or swollen lymph nodes Endocrine ROS: negative for - galactorrhea, hair pattern changes, polydipsia/polyuria or temperature intolerance Respiratory ROS: negative for - cough, hemoptysis, shortness of breath or wheezing Cardiovascular ROS: negative for - chest pain, dyspnea on exertion, edema or irregular heartbeat Gastrointestinal ROS: negative for - abdominal pain, diarrhea, hematemesis, nausea/vomiting or stool incontinence Genito-Urinary ROS: negative for - dysuria, hematuria, incontinence or urinary frequency/urgency Musculoskeletal ROS: negative for - joint swelling or muscular weakness Neurological ROS: as noted in HPI Dermatological ROS: negative for rash and skin lesion changes     Physical Examination: Blood pressure 153/91, pulse 64, temperature 97.9 F (36.6 C), temperature source Oral, resp. rate 18, height 5\' 4"  (1.626 m), weight 88.451 kg (195 lb), SpO2 100 %.  Neurological Examination Mental Status: Alert, oriented, thought content appropriate.  Speech fluent without evidence of aphasia.  Able to follow 3 step commands without difficulty. Cranial Nerves: II: Discs flat bilaterally; Visual fields grossly normal, pupils equal, round, reactive to light and accommodation III,IV, VI: ptosis not  present, extra-ocular motions intact bilaterally V,VII: smile symmetric, facial light touch sensation normal bilaterally VIII: hearing normal bilaterally IX,X: gag reflex present XI: bilateral shoulder shrug XII: midline tongue extension Motor: Right : Upper extremity   5/5    Left:     Upper extremity   5/5  Lower extremity   5/5     Lower extremity   5/5 Tone and bulk:normal tone throughout; no atrophy noted Sensory: Pinprick and light touch intact throughout, bilaterally Deep Tendon Reflexes: 2+ and symmetric throughout Plantars: Right: downgoing   Left: downgoing Cerebellar: normal finger-to-nose, normal rapid alternating movements and normal heel-to-shin test Gait: not accessed      Laboratory Studies:   Basic Metabolic Panel:  Recent Labs Lab 07/10/14 0256  NA 142  K 3.7  CL 102  CO2 30  GLUCOSE 140*  BUN 19  CREATININE 1.03*  CALCIUM 9.8    Liver Function Tests:  Recent Labs Lab 07/10/14 0256  AST 26  ALT 27  ALKPHOS 84  BILITOT 0.4  PROT 8.2*  ALBUMIN 4.6   No results for input(s): LIPASE, AMYLASE in the last 168 hours. No results for input(s): AMMONIA in the last 168 hours.  CBC:  Recent Labs Lab 07/10/14 0256  WBC 6.2  HGB 14.3  HCT 43.1  MCV 91.2  PLT 180    Cardiac Enzymes:  Recent Labs Lab 07/10/14 0256  CKTOTAL 137  CKMB 7.2*  TROPONINI <0.03    BNP: Invalid input(s): POCBNP  CBG: No results for input(s): GLUCAP in the last 168 hours.  Microbiology: Results for orders placed or performed in visit on 06/25/14  HPV DNA Probe (High Risk)     Status: Abnormal   Collection Time: 06/25/14 10:52 AM  Result Value Ref Range Status   HPV, high-risk Positive (A) Negative Final    Comment: This high-risk HPV test detects thirteen high-risk types (16/18/31/33/35/39/45/51/52/56/58/59/68) without differentiation.     Coagulation Studies: No results for input(s): LABPROT, INR in the last 72 hours.  Urinalysis: No results for  input(s): COLORURINE, LABSPEC, PHURINE, GLUCOSEU, HGBUR, BILIRUBINUR, KETONESUR, PROTEINUR, UROBILINOGEN, NITRITE, LEUKOCYTESUR in the last 168 hours.  Invalid input(s): APPERANCEUR  Lipid Panel:     Component Value Date/Time   CHOL 231* 02/28/2014 1656   TRIG 80 02/28/2014 1656   HDL 54 02/28/2014 1656   CHOLHDL 4.3 02/28/2014 1656   LDLCALC 161* 02/28/2014 1656    HgbA1C:  Lab Results  Component Value Date   HGBA1C 6.5* 02/28/2014    Urine Drug Screen:  No results found for: LABOPIA, COCAINSCRNUR, LABBENZ, AMPHETMU, THCU, LABBARB  Alcohol Level: No results for input(s): ETH in the last 168 hours.  Other results:   Imaging: Ct Angio Head W/cm &/or Wo Cm  07/10/2014   CLINICAL DATA:  Hypertension, not "feeling right " in head. On antibiotics for urinary tract infection. History of stroke, diabetes.  EXAM: CT ANGIOGRAPHY HEAD  TECHNIQUE: Multidetector CT imaging of the head was performed using the standard protocol during bolus administration of intravenous contrast. Multiplanar CT image reconstructions and MIPs were obtained to evaluate the vascular anatomy.  CONTRAST:  OMNIPAQUE IOHEXOL 350 MG/ML SOLN  COMPARISON:  CT head May 09, 2008 and MRI of the brain April 05, 2006  FINDINGS: CT HEAD  The ventricles and sulci are normal. No  intraparenchymal hemorrhage, mass effect nor midline shift. No acute large vascular territory infarcts. Mesial bilateral occipital lobe encephalomalacia. Remote bilateral basal ganglia cystic lacunar infarcts. Patchy white matter changes are similar to prior examination though, advanced for age. No abnormal intracranial enhancement.  No abnormal extra-axial fluid collections. Basal cisterns are patent.  No skull fracture. The included ocular globes and orbital contents are non-suspicious. The mastoid aircells and included paranasal sinuses are well-aerated.  CTA HEAD  Anterior circulation: Calcific atherosclerosis of the bilateral carotid bulbs  resulting in high-grade stenosis RIGHT proximal supraclinoid internal carotid artery. Moderate stenosis LEFT distal cavernous internal carotid artery. High-grade stenosis origin of the A1 segments bilaterally. Moderate stenosis LEFT M1 origin. Mild luminal irregularity of the mid to distal anterior and middle cerebral artery segments  Posterior circulation: LEFT vertebral artery is dominant. RIGHT vertebral artery predominantly terminates in the posterior inferior cerebellar artery though, tiny occluded distal RIGHT vertebral artery. Normal appearance of the vertebrobasilar junction. High-grade stenosis bilateral P1 origins. Occluded RIGHT P2 segment with minimal reconstitution distally. Occluded distal LEFT P2 segment with minimal reconstitution.  IMPRESSION: No acute intracranial process.  Bilateral occipital lobe/posterior cerebral artery territory infarcts. Remote bilateral basal ganglia lacunar infarcts.  Moderate white matter changes suggest chronic small vessel ischemic disease, advanced for age.  High-grade stenosis bilateral P1 origins, including bilateral P2 segments with minimal reconstitution.  High-grade stenosis RIGHT supraclinoid internal carotid artery, moderate stenosis LEFT distal cavernous internal carotid artery. High-grade stenosis bilateral A1 origins, moderate stenosis LEFT M1 origin.   Electronically Signed   By: Awilda Metro M.D.   On: 07/10/2014 06:12     Assessment/Plan:  58 y.o. female  presents to the hospital complaining of nausea and disorientation. 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 The patient remembers her neck feeling very full and hearing her pulse and her head. She denied any chest pain or shortness of breath at this time but admits to feeling nauseous without vomiting. In the emergency department the patient's systolic blood pressure was found to be  greater than 220. Pt's symptoms improved and she is currently back to baseline.    MRI brain no acute ischemia, but multiple white matter changes on MRI likely HTN related. CTA does show significant intracranial stenosis.    - Likely HTN urgency. - pt has significant intracranial stenosis Will add Plavix 75 daily to current ASA 81 - PT/OT - d/c planning from neuro stand point.   07/10/2014, 12:19 PM

## 2014-07-10 NOTE — Progress Notes (Signed)
NSR. Room air. Passed swallow eval. SCD on. A & O. No neuro defeceits. Takes meds ok. Daughter at the bedside. UP to BR and toelrated it well. MRI showed a remote bilat occipital infarct. Plavix was added. ECHO pending. Pt has not reported any pain. Ativan was given to help pt with MRI. Pt has no further concerns at this time.

## 2014-07-10 NOTE — ED Notes (Signed)
Admitting MD at bedside.

## 2014-07-10 NOTE — ED Notes (Signed)
Patient insisted on walking from triage to room. Patient became dizzy en route and had to be placed in a w/c and taken to the room.

## 2014-07-10 NOTE — Telephone Encounter (Signed)
I will check on her at the hospital tomorrow

## 2014-07-10 NOTE — ED Notes (Signed)
MD Manson PasseyBrown at bedside, updating pt on current plan of care and CT results.

## 2014-07-10 NOTE — ED Provider Notes (Signed)
[facility] Emergency Department Provider Note  ____________________________________________  Time seen: 3:20 AM  I have reviewed the triage vital signs and the nursing notes.   HISTORY  Chief Complaint Nausea and Hypertension      HPI Tami Jones is a 59 y.o. female presents with dizziness and hypertension times one day. Patient states "I have not felt right since waking up yesterday morning". Blood pressure noted to be 252/125 on presentation to the emergency department. Currently 203/99 at bedside.     Past Medical History  Diagnosis Date  . Diabetes mellitus   . GERD (gastroesophageal reflux disease)   . Hyperlipidemia   . CVA (cerebral vascular accident) 03/2006    right occipital, Dr. Thad Ranger  . Hypertension     Patient Active Problem List   Diagnosis Date Noted  . Preventative health care 06/25/2014  . UTI (urinary tract infection) 06/25/2014  . Right sided sciatica 03/08/2013  . MDD (major depressive disorder) 09/27/2012  . MENOPAUSAL SYNDROME 09/10/2008  . Anxiety state, unspecified 09/15/2007  . Essential hypertension, benign 08/10/2006  . Diabetes mellitus type 2, controlled, without complications 06/17/2006  . HYPERCHOLESTEROLEMIA 06/17/2006  . GERD 06/17/2006    Past Surgical History  Procedure Laterality Date  . Cesarean section    . Tonsillectomy and adenoidectomy    . Esophagogastroduodenoscopy  05/2005  . Cardiovascular stress test  1/15    myoview. EF 60%    Current Outpatient Rx  Name  Route  Sig  Dispense  Refill  . aspirin 81 MG tablet   Oral   Take 81 mg by mouth daily.           Marland Kitchen atorvastatin (LIPITOR) 20 MG tablet   Oral   Take 1 tablet (20 mg total) by mouth daily.   90 tablet   3   . ciprofloxacin (CIPRO) 250 MG tablet   Oral   Take 1 tablet (250 mg total) by mouth 2 (two) times daily.   20 tablet   0   . HYDROcodone-acetaminophen (NORCO/VICODIN) 5-325 MG per tablet   Oral   Take 0.5  tablets by mouth every 6 (six) hours as needed for moderate pain.   60 tablet   0   . lidocaine (LIDODERM) 5 %   Transdermal   Place 1 patch onto the skin daily.   30 patch   11   . lisinopril-hydrochlorothiazide (ZESTORETIC) 20-12.5 MG per tablet   Oral   Take 2 tablets by mouth daily.   180 tablet   3   . metoprolol succinate (TOPROL-XL) 100 MG 24 hr tablet   Oral   Take 1 tablet (100 mg total) by mouth daily. Take with or immediately following a meal.   90 tablet   3     Allergies Citalopram; Doxycycline hyclate; Montelukast sodium; Nitrofurantoin; Sulfa antibiotics; Tramadol hcl; and Venlafaxine hcl  No family history on file.  Social History History  Substance Use Topics  . Smoking status: Never Smoker   . Smokeless tobacco: Never Used  . Alcohol Use: 0.0 oz/week    0 Standard drinks or equivalent per week     Comment: wine occassionally    Review of Systems  Constitutional: Negative for fever. Eyes: Negative for visual changes. ENT: Negative for sore throat. Cardiovascular: Negative for chest pain. Respiratory: Negative for shortness of breath. Gastrointestinal: Negative for abdominal pain, vomiting and diarrhea. Genitourinary: Negative for dysuria. Musculoskeletal: Negative for back pain. Skin: Negative for rash. Neurological: Negative for headaches, focal  weakness or numbness. Positive dizziness Psychiatric: Positive for anxiety  10-point ROS otherwise negative.  ____________________________________________   PHYSICAL EXAM:  VITAL SIGNS: ED Triage Vitals  Enc Vitals Group     BP 07/10/14 0244 252/125 mmHg     Pulse Rate 07/10/14 0244 122     Resp 07/10/14 0244 20     Temp 07/10/14 0244 98 F (36.7 C)     Temp Source 07/10/14 0244 Oral     SpO2 07/10/14 0244 100 %     Weight 07/10/14 0244 195 lb (88.451 kg)     Height 07/10/14 0244  (1.626 m)     Head Cir --      Peak Flow --      Pain Score 07/10/14 0245 2     Pain Loc --       Pain Edu? --      Excl. in GC? --      Constitutional: Alert and oriented. Well appearing and in no distress. Eyes: Conjunctivae are normal. PERRL. Normal extraocular movements. ENT   Head: Normocephalic and atraumatic.   Nose: No congestion/rhinnorhea.   Mouth/Throat: Mucous membranes are moist.   Neck: No stridor. Hematological/Lymphatic/Immunilogical: No cervical lymphadenopathy. Cardiovascular: Normal rate, regular rhythm. Normal and symmetric distal pulses are present in all extremities. No murmurs, rubs, or gallops. Respiratory: Normal respiratory effort without tachypnea nor retractions. Breath sounds are clear and equal bilaterally. No wheezes/rales/rhonchi. Gastrointestinal: Soft and nontender. No distention. There is no CVA tenderness. Genitourinary: deferred Musculoskeletal: Nontender with normal range of motion in all extremities. No joint effusions.  No lower extremity tenderness nor edema. Neurologic:  Normal speech and language. No gross focal neurologic deficits are appreciated. Speech is normal.  Skin:  Skin is warm, dry and intact. No rash noted. Psychiatric: Mood and affect are normal. Speech and behavior are normal. Patient exhibits appropriate insight and judgment.  ____________________________________________    LABS (pertinent positives/negatives)  Labs Reviewed  COMPREHENSIVE METABOLIC PANEL - Abnormal; Notable for the following:    Glucose, Bld 140 (*)    Creatinine, Ser 1.03 (*)    Total Protein 8.2 (*)    GFR calc non Af Amer 59 (*)    All other components within normal limits  CK TOTAL AND CKMB - Abnormal; Notable for the following:    CK, MB 7.2 (*)    Relative Index 5.3 (*)    All other components within normal limits  CBC  TROPONIN I     ____________________________________________   EKG   Date: 07/10/2014  Rate: 109  Rhythm: Sinus tachycardia  QRS Axis: normal  Intervals: normal  ST/T Wave abnormalities: normal   Conduction Disutrbances: none  Narrative Interpretation: unremarkable Interpreted by me      ____________________________________________    RADIOLOGY  CT angio of the brain revealed multiple areas of high-grade stenosis. Bilateral cerebral infarcts "not acute" per Dr. Karie Kirks  ____________________________________________      INITIAL IMPRESSION / ASSESSMENT AND PLAN / ED COURSE  Pertinent labs & imaging results that were available during my care of the patient were reviewed by me and considered in my medical decision making (see chart for details).  History of physical exam concerning for CVA as such CT angiogram of the brain performed which revealed multiple areas of high-grade stenosis and bilateral cerebral infarcts. Patient will be admitted to hospital for further evaluation including MRI  ____________________________________________   FINAL CLINICAL IMPRESSION(S) / ED DIAGNOSES  Final diagnoses:  Lacunar infarction  Darci Currentandolph N Brown, MD 07/10/14 [phone]

## 2014-07-10 NOTE — ED Notes (Signed)
Patient presents with c/o her BP being high - states - "I just dont feel right in my head." Patient very anxious. Of note, on Cipro for UTI.

## 2014-07-10 NOTE — Telephone Encounter (Signed)
Patient called to let you know she was admitted to [facility] for her blood pressure.  Patient said they're concerned about the arteries in her neck.  Patient is faxing FMLA paperwork.

## 2014-07-11 ENCOUNTER — Inpatient Hospital Stay (HOSPITAL_COMMUNITY)
Admit: 2014-07-11 | Discharge: 2014-07-11 | Disposition: A | Discharge status: Home / Self Care | Payer: 59 | Attending: Internal Medicine | Admitting: Internal Medicine

## 2014-07-11 DIAGNOSIS — I639 Cerebral infarction, unspecified: Secondary | ICD-10-CM

## 2014-07-11 LAB — BASIC METABOLIC PANEL
ANION GAP: 10 (ref 5–15)
BUN: 26 mg/dL — AB (ref 6–20)
CHLORIDE: 102 mmol/L (ref 101–111)
CO2: 27 mmol/L (ref 22–32)
CREATININE: 1.48 mg/dL — AB (ref 0.44–1.00)
Calcium: 8.9 mg/dL (ref 8.9–10.3)
GFR calc Af Amer: 44 mL/min — ABNORMAL LOW (ref >60)
GFR, EST NON AFRICAN AMERICAN: 38 mL/min — AB (ref >60)
Glucose, Bld: 103 mg/dL — ABNORMAL HIGH (ref 65–99)
POTASSIUM: 4.2 mmol/L (ref 3.5–5.1)
Sodium: 139 mmol/L (ref 135–145)

## 2014-07-11 LAB — GLUCOSE, CAPILLARY
Glucose-Capillary: 98 mg/dL (ref 65–99)
Glucose-Capillary: 123 mg/dL — ABNORMAL HIGH (ref 65–99)

## 2014-07-11 LAB — LIPID PANEL
CHOL/HDL RATIO: 3.9 ratio
Cholesterol: 150 mg/dL (ref 0–200)
HDL: 38 mg/dL — ABNORMAL LOW (ref >40)
LDL Cholesterol: 95 mg/dL (ref 0–99)
TRIGLYCERIDES: 85 mg/dL (ref <150)
VLDL: 17 mg/dL (ref 0–40)

## 2014-07-11 LAB — HEMOGLOBIN A1C: Hgb A1c MFr Bld: 6.1 % — ABNORMAL HIGH (ref 4.0–6.0)

## 2014-07-11 MED ORDER — HYDRALAZINE HCL 25 MG PO TABS: 25.0000 mg | ORAL_TABLET | Freq: Three times a day (TID) | ORAL | Status: DC

## 2014-07-11 MED ORDER — CLOPIDOGREL BISULFATE 75 MG PO TABS: 75.0000 mg | ORAL_TABLET | Freq: Every day | ORAL | Status: DC

## 2014-07-11 MED ORDER — AMLODIPINE BESYLATE 10 MG PO TABS: 10.0000 mg | ORAL_TABLET | Freq: Every day | ORAL | Status: DC

## 2014-07-11 MED ORDER — SODIUM CHLORIDE 0.9 % IV BOLUS (SEPSIS)
500.0000 mL | Freq: Once | INTRAVENOUS | Status: AC
  Administered 2014-07-11: 500 mL via INTRAVENOUS

## 2014-07-11 MED ORDER — ACETAMINOPHEN 325 MG PO TABS: 650.0000 mg | ORAL_TABLET | Freq: Four times a day (QID) | ORAL | Status: DC | PRN

## 2014-07-11 MED ORDER — ALUM & MAG HYDROXIDE-SIMETH 200-200-20 MG/5ML PO SUSP
30.0000 mL | Freq: Four times a day (QID) | ORAL | Status: DC | PRN
  Administered 2014-07-11: 30 mL via ORAL
  Filled 2014-07-11: qty 30

## 2014-07-11 MED ORDER — LORATADINE 10 MG PO TABS
10.0000 mg | ORAL_TABLET | Freq: Once | ORAL | Status: AC
  Administered 2014-07-11: 10 mg via ORAL
  Filled 2014-07-11: qty 1

## 2014-07-11 NOTE — Progress Notes (Signed)
*  PRELIMINARY RESULTS* Echocardiogram 2D Echocardiogram has been performed.  Georgann HousekeeperJerry R Hege 07/11/2014, 8:34 AM

## 2014-07-11 NOTE — Care Management (Signed)
No discharge needs. 

## 2014-07-11 NOTE — Discharge Summary (Signed)
[facility] [place] at [facility]   PATIENT NAME: Tami Jones    MR#:  [id number]  DATE OF BIRTH:  1955/03/07  DATE OF ADMISSION:  07/10/2014 ADMITTING PHYSICIAN: Arnaldo Natal, MD  DATE OF DISCHARGE: 07/11/2014  3:45 PM  PRIMARY CARE PHYSICIAN: Tillman Abide, MD    ADMISSION DIAGNOSIS:  Lacunar infarction [I63.9]  DISCHARGE DIAGNOSIS:  Active Problems:   HTN (hypertension), malignant   SECONDARY DIAGNOSIS:   Past Medical History  Diagnosis Date  . Diabetes mellitus   . GERD (gastroesophageal reflux disease)   . Hyperlipidemia   . CVA (cerebral vascular accident) 03/2006    right occipital, Dr. Thad Ranger  . Hypertension     HOSPITAL COURSE:   Brief history and physical patient came into the ED with a chief complaint of nausea. Her initial blood pressure was very high at 220 systolic. Head CT revealed new relatively remote infarct in addition to the narrowing of internal carotid arteries. Patient is admitted to the hospital for malignant hypertension and for possible CVA. Please review history and physical for details  Hospital course  1. Hypertension: Uncontrolled.  The patient does have a remote infarct but no new neurologic deficits. Her decreased peripheral vision is an old finding, with remote infarct Plavix added to babay asa for multiple white matter changes on MRI, likely HTN related Echo normal Appreciate Neurology consut  2. Diabetes mellitus type 2: The patient states DM is diet controlled. She has lost more than 100 pounds over more than a year. Check hemoglobin A1c. Place patient on sliding scale if needed.  3. Hyperlipidemia: Continue statin therapy.LDL at 95  4. Obesity: BMI 33.5; encourage healthy diet and exercise  5. DVT prophylaxis: Heparin   6. Gen weakness -improved  7. Elevated TSH-can be acute phase reactant. PCP to consider repeating thyroid still relating hormone intact 4-8 weeks  8. Acute kidney  injury-given IV fluids, stopped nephrotoxins. PCP to consider repeating BMP in a week     DISCHARGE CONDITIONS:   Satisfactory  CONSULTS OBTAINED:  Treatment Team:  Pauletta Browns, MD Tonette Lederer, PA-C   PROCEDURES none  DRUG ALLERGIES:   Allergies  Allergen Reactions  . Citalopram Other (See Comments)    intolerant  . Doxycycline Hyclate Other (See Comments)    REACTION: stomach upset  . Montelukast Sodium Other (See Comments)    REACTION: unspecified  . Nitrofurantoin Other (See Comments)    REACTION: unspecified  . Sulfa Antibiotics Nausea Only  . Tramadol Hcl Other (See Comments)    REACTION: Can not tolerate  . Venlafaxine Hcl Other (See Comments)    Intolerant    DISCHARGE MEDICATIONS:   Discharge Medication List as of 07/11/2014  2:47 PM    START taking these medications   Details  acetaminophen (TYLENOL) 325 MG tablet Take 2 tablets (650 mg total) by mouth every 6 (six) hours as needed for mild pain (or Fever >/= 101)., Starting 07/11/2014, Until Discontinued, OTC    amLODipine (NORVASC) 10 MG tablet Take 1 tablet (10 mg total) by mouth daily., Starting 07/11/2014, Until Discontinued, Print    clopidogrel (PLAVIX) 75 MG tablet Take 1 tablet (75 mg total) by mouth daily., Starting 07/11/2014, Until Discontinued, Print    hydrALAZINE (APRESOLINE) 25 MG tablet Take 1 tablet (25 mg total) by mouth every 8 (eight) hours., Starting 07/11/2014, Until Discontinued, Print      CONTINUE these medications which have NOT CHANGED   Details  aspirin 81 MG tablet  Take 81 mg by mouth daily.  , Until Discontinued, Historical Med    atorvastatin (LIPITOR) 20 MG tablet Take 1 tablet (20 mg total) by mouth daily., Starting 03/19/2014, Until Discontinued, Normal    HYDROcodone-acetaminophen (NORCO/VICODIN) 5-325 MG per tablet Take 0.5 tablets by mouth every 6 (six) hours as needed for moderate pain., Starting 02/28/2014, Until Discontinued, Print    lidocaine (LIDODERM)  5 % Place 1 patch onto the skin daily., Starting 02/28/2014, Until Discontinued, Normal    metoprolol succinate (TOPROL-XL) 100 MG 24 hr tablet Take 1 tablet (100 mg total) by mouth daily. Take with or immediately following a meal., Starting 02/28/2014, Until Discontinued, Normal      STOP taking these medications     ciprofloxacin (CIPRO) 250 MG tablet      lisinopril-hydrochlorothiazide (ZESTORETIC) 20-12.5 MG per tablet          DISCHARGE INSTRUCTIONS:  Follow-up with primary care physician in a week Outpatient follow-up with diabetic clinic-lifestyle in a week And PCP to consider repeating thyroid still relating hormone in 6 weeks approximately, repeat BMP in a week   DIET:  Healthy heart, diabetic  DISCHARGE CONDITION:  Satisfactory  ACTIVITY:  {As tolerated  OXYGEN:  Home Oxygen: No  DISCHARGE LOCATION:  Home  u experience worsening of your admission symptoms, develop shortness of breath, life threatening emergency, suicidal or homicidal thoughts you must seek medical attention immediately by calling 911 or calling your MD immediately  if symptoms less severe.  You Must read complete instructions/literature along with all the possible adverse reactions/side effects for all the Medicines you take and that have been prescribed to you. Take any new Medicines after you have completely understood and accpet all the possible adverse reactions/side effects.   Please note  You were cared for by a hospitalist during your hospital stay. If you have any questions about your discharge medications or the care you received while you were in the hospital after you are discharged, you can call the unit and asked to speak with the hospitalist on call if the hospitalist that took care of you is not available. Once you are discharged, your primary care physician will handle any further medical issues. Please note that NO REFILLS for any discharge medications will be authorized once you are  discharged, as it is imperative that you return to your primary care physician (or establish a relationship with a primary care physician if you do not have one) for your aftercare needs so that they can reassess your need for medications and monitor your lab values.     Today  Chief Complaint  Patient presents with  . Nausea  . Hypertension    patient is feeling better. Denies any chest pain or shortness of breath. Denies any headache or blurry vision.   ROS:  CONSTITUTIONAL: Denies fevers, chills. Denies any fatigue, weakness.  EYES: Denies blurry vision, double vision, eye pain. EARS, NOSE, THROAT: Denies tinnitus, ear pain, hearing loss. RESPIRATORY: Denies cough, wheeze, shortness of breath.  CARDIOVASCULAR: Denies chest pain, palpitations, edema.  GASTROINTESTINAL: Denies nausea, vomiting, diarrhea, abdominal pain. Denies bright red blood per rectum. GENITOURINARY: Denies dysuria, hematuria. ENDOCRINE: Denies nocturia or thyroid problems. HEMATOLOGIC AND LYMPHATIC: Denies easy bruising or bleeding. SKIN: Denies rash or lesion. MUSCULOSKELETAL: Denies pain in neck, back, shoulder, knees, hips or arthritic symptoms.  NEUROLOGIC: Denies paralysis, paresthesias.  PSYCHIATRIC: Denies anxiety or depressive symptoms.   VITAL SIGNS:  Blood pressure 112/61, pulse 59, temperature 98.4 F (36.9 C), temperature  source Oral, resp. rate 16, height  (1.626 m), weight 85.367 kg (188 lb 3.2 oz), SpO2 100 %.  I/O:   Intake/Output Summary (Last 24 hours) at 07/11/14 1620 Last data filed at 07/11/14 1200  Gross per 24 hour  Intake    720 ml  Output    800 ml  Net    -80 ml    PHYSICAL EXAMINATION:  GENERAL:  59 y.o.-year-old patient lying in the bed with no acute distress.  EYES: Pupils equal, round, reactive to light and accommodation. No scleral icterus. Extraocular muscles intact.  HEENT: Head atraumatic, normocephalic. Oropharynx and nasopharynx clear.  NECK:  Supple, no  jugular venous distention. No thyroid enlargement, no tenderness.  LUNGS: Normal breath sounds bilaterally, no wheezing, rales,rhonchi or crepitation. No use of accessory muscles of respiration.  CARDIOVASCULAR: S1, S2 normal. No murmurs, rubs, or gallops.  ABDOMEN: Soft, non-tender, non-distended. Bowel sounds present. No organomegaly or mass.  EXTREMITIES: No pedal edema, cyanosis, or clubbing.  NEUROLOGIC: Cranial nerves II through XII are intact. Muscle strength 5/5 in all extremities. Sensation intact. Gait not checked.  PSYCHIATRIC: The patient is alert and oriented x 3.  SKIN: No obvious rash, lesion, or ulcer.   DATA REVIEW:   CBC  Recent Labs Lab 07/10/14 0256  WBC 6.2  HGB 14.3  HCT 43.1  PLT 180    Chemistries   Recent Labs Lab 07/10/14 0256 07/11/14 0400  NA 142 139  K 3.7 4.2  CL 102 102  CO2 30 27  GLUCOSE 140* 103*  BUN 19 26*  CREATININE 1.03* 1.48*  CALCIUM 9.8 8.9  AST 26  --   ALT 27  --   ALKPHOS 84  --   BILITOT 0.4  --     Cardiac Enzymes  Recent Labs Lab 07/10/14 0256  TROPONINI <0.03    Microbiology Results  Results for orders placed or performed in visit on 06/25/14  HPV DNA Probe (High Risk)     Status: Abnormal   Collection Time: 06/25/14 10:52 AM  Result Value Ref Range Status   HPV, high-risk Positive (A) Negative Final    Comment: This high-risk HPV test detects thirteen high-risk types (16/18/31/33/35/39/45/51/52/56/58/59/68) without differentiation.     RADIOLOGY:  Ct Angio Head W/cm &/or Wo Cm  07/10/2014   CLINICAL DATA:  Hypertension, not "feeling right " in head. On antibiotics for urinary tract infection. History of stroke, diabetes.  EXAM: CT ANGIOGRAPHY HEAD  TECHNIQUE: Multidetector CT imaging of the head was performed using the standard protocol during bolus administration of intravenous contrast. Multiplanar CT image reconstructions and MIPs were obtained to evaluate the vascular anatomy.  CONTRAST:   OMNIPAQUE IOHEXOL 350 MG/ML SOLN  COMPARISON:  CT head May 09, 2008 and MRI of the brain April 05, 2006  FINDINGS: CT HEAD  The ventricles and sulci are normal. No intraparenchymal hemorrhage, mass effect nor midline shift. No acute large vascular territory infarcts. Mesial bilateral occipital lobe encephalomalacia. Remote bilateral basal ganglia cystic lacunar infarcts. Patchy white matter changes are similar to prior examination though, advanced for age. No abnormal intracranial enhancement.  No abnormal extra-axial fluid collections. Basal cisterns are patent.  No skull fracture. The included ocular globes and orbital contents are non-suspicious. The mastoid aircells and included paranasal sinuses are well-aerated.  CTA HEAD  Anterior circulation: Calcific atherosclerosis of the bilateral carotid bulbs resulting in high-grade stenosis RIGHT proximal supraclinoid internal carotid artery. Moderate stenosis LEFT distal cavernous internal carotid artery. High-grade stenosis  origin of the A1 segments bilaterally. Moderate stenosis LEFT M1 origin. Mild luminal irregularity of the mid to distal anterior and middle cerebral artery segments  Posterior circulation: LEFT vertebral artery is dominant. RIGHT vertebral artery predominantly terminates in the posterior inferior cerebellar artery though, tiny occluded distal RIGHT vertebral artery. Normal appearance of the vertebrobasilar junction. High-grade stenosis bilateral P1 origins. Occluded RIGHT P2 segment with minimal reconstitution distally. Occluded distal LEFT P2 segment with minimal reconstitution.  IMPRESSION: No acute intracranial process.  Bilateral occipital lobe/posterior cerebral artery territory infarcts. Remote bilateral basal ganglia lacunar infarcts.  Moderate white matter changes suggest chronic small vessel ischemic disease, advanced for age.  High-grade stenosis bilateral P1 origins, including bilateral P2 segments with minimal reconstitution.   High-grade stenosis RIGHT supraclinoid internal carotid artery, moderate stenosis LEFT distal cavernous internal carotid artery. High-grade stenosis bilateral A1 origins, moderate stenosis LEFT M1 origin.   Electronically Signed   By: Awilda Metroourtnay  Bloomer M.D.   On: 07/10/2014 06:12   Mr Angiogram Neck W Wo Contrast  07/10/2014   CLINICAL DATA:  SHAKING, CONFUSION, ELEVATED BLOOD PRESSURE. History of diabetes. History of hypertension.  EXAM: MRI HEAD WITHOUT AND WITH CONTRAST  MRA EXTRACRANIAL CIRCULATION WITHOUT AND WITH CONTRAST  TECHNIQUE: Multiplanar, multiecho pulse sequences of the brain and surrounding structures were obtained without and with intravenous contrast. Angiographic images of the head were obtained using MRA technique without and with contrast.  CONTRAST:  18mL MULTIHANCE GADOBENATE DIMEGLUMINE 529 MG/ML IV SOLN  COMPARISON:  CT head and CTA head earlier today.  FINDINGS: MRI HEAD FINDINGS  No restricted diffusion, acute or chronic hemorrhage, mass lesion, hydrocephalus, or extra-axial fluid. Normal for age cerebral volume. Moderate T2 and FLAIR hyperintensities throughout the periventricular and subcortical white matter, consistent with chronic microvascular ischemic change. Moderate-sized LEFT and smaller RIGHT basal ganglia infarcts.  In the BILATERAL medial occipital lobes, there is evidence for remote cerebral infarction. T2 and FLAIR hyperintensity surrounds the area of brain substance loss, mimicking vasogenic edema but consistent with gliosis. No posterior circulation edema to suggest PRES.  Post infusion, no abnormal enhancement of brain or meninges. Extracranial soft tissues unremarkable.  MRA EXTRACRANIAL FINDINGS  Conventional branching of the great vessels from the arch. No proximal stenosis. Minor irregularity both carotid bifurcations without flow-limiting stenosis, ulceration, dissection, or fibromuscular change.  Dominant LEFT vertebral provides the sole contribution of the  basilar. Diminutive RIGHT vertebral without a significant continuous vessel from the subclavian to the skull base.  IMPRESSION: BILATERAL remote occipital infarcts, remote BILATERAL basal ganglia infarcts, and small vessel disease. No acute intracranial findings.  No extracranial carotid artery disease. Dominant/sole LEFT vertebral contribution to the basilar.   Electronically Signed   By: Davonna BellingJohn  Curnes M.D.   On: 07/10/2014 12:25   Mr Laqueta JeanBrain W ZOWo Contrast  07/10/2014   CLINICAL DATA:  SHAKING, CONFUSION, ELEVATED BLOOD PRESSURE. History of diabetes. History of hypertension.  EXAM: MRI HEAD WITHOUT AND WITH CONTRAST  MRA EXTRACRANIAL CIRCULATION WITHOUT AND WITH CONTRAST  TECHNIQUE: Multiplanar, multiecho pulse sequences of the brain and surrounding structures were obtained without and with intravenous contrast. Angiographic images of the head were obtained using MRA technique without and with contrast.  CONTRAST:  18mL MULTIHANCE GADOBENATE DIMEGLUMINE 529 MG/ML IV SOLN  COMPARISON:  CT head and CTA head earlier today.  FINDINGS: MRI HEAD FINDINGS  No restricted diffusion, acute or chronic hemorrhage, mass lesion, hydrocephalus, or extra-axial fluid. Normal for age cerebral volume. Moderate T2 and FLAIR hyperintensities throughout the periventricular and  subcortical white matter, consistent with chronic microvascular ischemic change. Moderate-sized LEFT and smaller RIGHT basal ganglia infarcts.  In the BILATERAL medial occipital lobes, there is evidence for remote cerebral infarction. T2 and FLAIR hyperintensity surrounds the area of brain substance loss, mimicking vasogenic edema but consistent with gliosis. No posterior circulation edema to suggest PRES.  Post infusion, no abnormal enhancement of brain or meninges. Extracranial soft tissues unremarkable.  MRA EXTRACRANIAL FINDINGS  Conventional branching of the great vessels from the arch. No proximal stenosis. Minor irregularity both carotid bifurcations  without flow-limiting stenosis, ulceration, dissection, or fibromuscular change.  Dominant LEFT vertebral provides the sole contribution of the basilar. Diminutive RIGHT vertebral without a significant continuous vessel from the subclavian to the skull base.  IMPRESSION: BILATERAL remote occipital infarcts, remote BILATERAL basal ganglia infarcts, and small vessel disease. No acute intracranial findings.  No extracranial carotid artery disease. Dominant/sole LEFT vertebral contribution to the basilar.   Electronically Signed   By: Davonna Belling M.D.   On: 07/10/2014 12:25    EKG:   Orders placed or performed during the hospital encounter of 07/10/14  . ED EKG  . ED EKG  . EKG 12-Lead  . EKG 12-Lead      Management plans discussed with the patient, family and they are in agreement.  CODE STATUS:     Code Status Orders        Start     Ordered   07/10/14 0924  Full code   Continuous     07/10/14 0923      TOTAL TIME TAKING CARE OF THIS PATIENT: 45 minutes.    @  on 07/11/2014 at 4:20 PM  Between 7am to 6pm - Pager - [phone]  After 6pm go to [email or web address] - password [email or web address] [facility]  [facility]  Office  [phone]  CC: Primary care physician; Tillman Abide, MD

## 2014-07-11 NOTE — Progress Notes (Signed)
Notified Dr. Amado CoeGouru of ECHO results per orders to discharge patient if ECHO is normal. Also informed MD of patient's request for medication for indigestion and work note. MD to order medication, and provide work note. Pt ok to be discharged per MD.

## 2014-07-11 NOTE — Progress Notes (Signed)
PT Cancellation Note  Patient Details Name: Tami Jones MRN: [id number] DOB: 03/22/1955   Cancelled Treatment:    Reason Eval/Treat Not Completed: Other (comment) (MD discontinued order based on new findings from imaging.)  Will need new order for evaluation if pt condition warrants another attempt to evaluate.   Ivar DrapeStout, Lotus Gover E 07/11/2014, 10:06 AM  Samul Dadauth Domanique Luckett, PT MS Acute Rehab Dept. Number: [facility] [id number] and [facility] [phone]

## 2014-07-11 NOTE — Progress Notes (Signed)
Spoke with Dr. Amado CoeGouru verbally, informed Provider pt reports pain to back of her neck, and "feeling strange." MD rounded on patient, ordered RN to discharge patient after approx. 1 hour. Pt informed of plan of care. Understanding voiced.

## 2014-07-11 NOTE — Progress Notes (Signed)
Spoke with provider via phone. D/C patient after fluids are completed.

## 2014-07-11 NOTE — Progress Notes (Signed)
Pt complained of sinus drainage and head congestion. Notified Dr. Betti Cruzeddy for an order.

## 2014-07-13 ENCOUNTER — Ambulatory Visit (INDEPENDENT_AMBULATORY_CARE_PROVIDER_SITE_OTHER): Payer: 59 | Admitting: Family Medicine

## 2014-07-13 ENCOUNTER — Encounter: Payer: Self-pay | Admitting: Family Medicine

## 2014-07-13 VITALS — BP 140/80 | Temp 98.1°F | Wt 192.0 lb

## 2014-07-13 DIAGNOSIS — I10 Essential (primary) hypertension: Secondary | ICD-10-CM | POA: Diagnosis not present

## 2014-07-13 MED ORDER — AMLODIPINE BESYLATE 10 MG PO TABS: 5.0000 mg | ORAL_TABLET | Freq: Every day | ORAL | Status: DC

## 2014-07-13 NOTE — Patient Instructions (Signed)
Hold amlodipine for now.  Can restart 1/2 tab a day when you are not lightheaded. Keep the follow up appointment with [place].   Drink plenty of fluids in the meantime.

## 2014-07-13 NOTE — Assessment & Plan Note (Signed)
Likely relative overtreatment, with lower BP now.  Hold amlodipine for now.  Can restart 1/2 tab a day when she isn't lightheaded.  Will keep f/u with PCP.  She agrees.  No sign of ominous process.  D/w pt.  She agrees.

## 2014-07-13 NOTE — Progress Notes (Signed)
Admitted with old CVA, high BP.  BP meds adjusted and f/u with PCP pending.  Since med change, she has been tired.  Lightheaded on standing, but no sx laying down.   Some occ nausea.  No focal neuro changes.  BP improved.  "I don't have energy to do anything."  No SOB, CP, BLE edema.  Appetite is fair.  Still drinking fluids. Last UOP was this AM.    Meds, vitals, and allergies reviewed.   ROS: See HPI.  Otherwise, noncontributory.  GEN: nad, alert and oriented HEENT: mucous membranes moist NECK: supple w/o LA CV: rrr.   PULM: ctab, no inc wob ABD: soft, +bs EXT: no edema

## 2014-07-16 ENCOUNTER — Telehealth: Payer: Self-pay

## 2014-07-16 NOTE — Telephone Encounter (Signed)
Will reevaluate in the morning

## 2014-07-16 NOTE — Telephone Encounter (Signed)
Pt seen 07/13/14 at Sat clinic. Pt has [facility] f/u appt on 07/17/2014.

## 2014-07-16 NOTE — Telephone Encounter (Signed)
Patient scheduled appt for tomorrow.

## 2014-07-16 NOTE — Telephone Encounter (Signed)
PLEASE NOTE: All timestamps contained within this report are represented as [place] Standard Time. CONFIDENTIALTY NOTICE: This fax transmission is intended only for the addressee. It contains information that is legally privileged, confidential or otherwise protected from use or disclosure. If you are not the intended recipient, you are strictly prohibited from reviewing, disclosing, copying using or disseminating any of this information or taking any action in reliance on or regarding this information. If you have received this fax in error, please notify us immediately by telephone so that we can arrange for its return to us. Phone: [phone], Toll-Free: [phone], Fax: [phone] Page: 1 of 2 Call Id: [id number] [facility] - Client TELEPHONE ADVICE RECORD [facility] Patient Name: Tami Jones Gender: Unknown DOB: 12/19/1955 Age: [age] Y 6 M 26 D Return Phone Number: [phone] (Primary) Address: [address] City[place]State/Zip: [place] [address] Client [facility] Night Physician Tillman AbideLetvak, Richard Contact Type Call Call Type Triage / Clinical Relationship To Patient Self Return Phone Number [phone] (Primary) Chief Complaint Headache Initial Comment Caller states was seen Saturday and taken off one med; in hospital last week; feels meds are not right; feels bad since on this med; still hurting at back of head; nerves torn up yesterday; hot flashes; PreDisposition Did not know what to do Nurse Assessment Nurse: Roderic OvensNorth, RN, Amy Date/Time Lamount Cohen(Eastern Time): 07/15/2014 10:45:38 AM Confirm and document reason for call. If symptomatic, describe symptoms. ---CALLER STATES THAT SHE WAS TAKEN OFF AMILODIPINE 10 MG. ATOVASTIN CALCIUM TAB 20 MG. SHE IS ALSO ON PLAVIX 75 MG. SHE IS ON HYDRALAZINE 25 MG, METOPROL 100 MG. SHE IS SUPPOSE TO TAKE THESE  MEDS IN THE A.M. IN THE AFTERNOONS WHEN SHE TAKES THEM THEY MAKE HER FEEL BAD. HE DOES NOT WANT TO DO ANYTHING BUT LAY AROUND AND FEELS THAT IS JUST NOT HER. SHE HAS 2 BIG BRUISES ON THE SIDE OF HER THIGH. SHE WAS IN THE HOSPITAL FOR HIGH BP. HEAD IS HURTING THIS MORNING. THEY ADDED MEDS WHILE SHE WAS IN THE HOSPITAL. SHE STATES SHE WENT TO THE [facility] AND SAW DR. Para MarchUNCAN AND HE TOOK HER OFF OF THE AMILIDOPINE SHE STATES THAT SHE IS STILL FEELING POORLY. SHE JUST DON'T LIKE THE WAY SHE FEELS. SHE CRIES, GETS UPSETS. Has the patient traveled out of the country within the last 30 days? ---Not Applicable Does the patient require triage? ---Yes Related visit to physician within the last 2 weeks? ---Yes Does the PT have any chronic conditions? (i.e. diabetes, asthma, etc.) ---Yes List chronic conditions. ---HIGH BP, CHOLESTEROL, BLOOD THINNER PLEASE NOTE: All timestamps contained within this report are represented as [place] Standard Time. CONFIDENTIALTY NOTICE: This fax transmission is intended only for the addressee. It contains information that is legally privileged, confidential or otherwise protected from use or disclosure. If you are not the intended recipient, you are strictly prohibited from reviewing, disclosing, copying using or disseminating any of this information or taking any action in reliance on or regarding this information. If you have received this fax in error, please notify us immediately by telephone so that we can arrange for its return to us. Phone: [phone], Toll-Free: [phone], Fax: [phone] Page: 2 of 2 Call Id: [id number] Guidelines Guideline Title Affirmed Question Affirmed Notes Nurse Date/Time Lamount Cohen(Eastern Time) High Blood Pressure [1] Taking BP medications AND [2] feels is having side effects (e.g., impotence, cough, dizzy upon  standing) Roderic Ovens, RN, Amy 07/15/2014 10:48:15 AM Disp. Time Lamount Cohen Time) Disposition Final User 07/15/2014 10:50:03 AM  See PCP When Office is Open (within 3 days) Yes [place], RN, Amy Caller Understands: Yes Disagree/Comply: Comply Care Advice Given Per Guideline SEE PCP WITHIN 3 DAYS: You need to be examined within 2 or 3 days. Call your doctor during regular office hours and make an appointment. (Note: if office will be open tomorrow, tell caller to call then, not in 3 days). CALL BACK IF: * Difficulty walking, difficulty talking, or severe headache occurs * Chest pain or difficulty breathing occurs * You become worse. CARE ADVICE given per High Blood Pressure (Adult) guideline. After Care Instructions Given Call Event Type User Date / Time Description

## 2014-07-16 NOTE — Telephone Encounter (Signed)
Does she have any idea what her blood pressure is? Reassure her that the bruising is from one of her new meds and is not dangerous. She does have appt tomorrow to review everything--see if she thinks she can make it till then

## 2014-07-16 NOTE — Telephone Encounter (Signed)
PLEASE NOTE: All timestamps contained within this report are represented as [place] Standard Time. CONFIDENTIALTY NOTICE: This fax transmission is intended only for the addressee. It contains information that is legally privileged, confidential or otherwise protected from use or disclosure. If you are not the intended recipient, you are strictly prohibited from reviewing, disclosing, copying using or disseminating any of this information or taking any action in reliance on or regarding this information. If you have received this fax in error, please notify us immediately by telephone so that we can arrange for its return to us. Phone: [phone], Toll-Free: [phone], Fax: [phone] Page: 1 of 2 Call Id: [id number] [facility] - Client TELEPHONE ADVICE RECORD [facility] Patient Name: Tami Jones Gender: Female DOB: 12/11/1955 Age: [age] Y 6 M 23 D Return Phone Number: [phone] (Primary) Address: City/State/Zip: [place] Client [facility] Phys[facility] Tillman AbideLetvak, Richard Contact Type Call Call Type Triage / Clinical Caller Name Tami Jones Relationship To Patient Self Return Phone Number [phone] (Primary) Chief Complaint Blood Pressure High Initial Comment Caller states she was treated in the hospital 2 days ago for high blood pressure. She was given medication and now feels very tired all the time. PreDisposition Go to ED Nurse Assessment Nurse: Roxana HiresZellers, RN, [place] Date/Time (Eastern Time): 07/12/2014 5:52:00 PM Confirm and document reason for call. If symptomatic, describe symptoms. ---caller states she just got out of hospital for blood pressure tht was too high. now taking blood pressure med and feels tired Has the patient traveled out of the country within the last 30 days? ---Not Applicable Does the patient require triage? ---Yes Related  visit to physician within the last 2 weeks? ---Yes Does the PT have any chronic conditions? (i.e. diabetes, asthma, etc.) ---Yes List chronic conditions. ---HTN Guidelines Guideline Title Affirmed Question Affirmed Notes Nurse Date/Time (Eastern Time) Weakness (Generalized) and Fatigue Taking a medicine that could cause weakness (e.g., blood pressure medications, diuretics) Zellers, RN, Afton 07/12/2014 5:57:46 PM Disp. Time Lamount Cohen(Eastern Time) Disposition Final User 07/12/2014 6:02:36 PM See Physician within 24 Hours Yes Zellers, RN, Afton Caller Understands: Yes Disagree/Comply: Comply PLEASE NOTE: All timestamps contained within this report are represented as [place] Standard Time. CONFIDENTIALTY NOTICE: This fax transmission is intended only for the addressee. It contains information that is legally privileged, confidential or otherwise protected from use or disclosure. If you are not the intended recipient, you are strictly prohibited from reviewing, disclosing, copying using or disseminating any of this information or taking any action in reliance on or regarding this information. If you have received this fax in error, please notify us immediately by telephone so that we can arrange for its return to us. Phone: [phone], Toll-Free: [phone], Fax: [phone] Page: 2 of 2 Call Id: [id number] Care Advice Given Per Guideline SEE PHYSICIAN WITHIN 24 HOURS: BRING MEDICINES: * Please bring a list of your current medicines when you go to see the doctor. * It is also a good idea to bring the pill bottles too. This will help the doctor to make certain you are taking the right medicines and the right dose. CALL BACK IF: * You become worse. Call [facility] office in morning at 9am for appointment After Care Instructions Given Call Event Type User Date / Time Description Referrals [facility]

## 2014-07-17 ENCOUNTER — Ambulatory Visit (INDEPENDENT_AMBULATORY_CARE_PROVIDER_SITE_OTHER): Payer: 59 | Admitting: Internal Medicine

## 2014-07-17 ENCOUNTER — Encounter: Payer: Self-pay | Admitting: Internal Medicine

## 2014-07-17 ENCOUNTER — Telehealth: Payer: Self-pay | Admitting: Internal Medicine

## 2014-07-17 VITALS — BP 130/80 | HR 83 | Temp 98.3°F | Wt 191.0 lb

## 2014-07-17 DIAGNOSIS — N179 Acute kidney failure, unspecified: Secondary | ICD-10-CM | POA: Diagnosis not present

## 2014-07-17 DIAGNOSIS — I10 Essential (primary) hypertension: Secondary | ICD-10-CM | POA: Diagnosis not present

## 2014-07-17 DIAGNOSIS — I679 Cerebrovascular disease, unspecified: Secondary | ICD-10-CM | POA: Insufficient documentation

## 2014-07-17 DIAGNOSIS — I672 Cerebral atherosclerosis: Secondary | ICD-10-CM | POA: Insufficient documentation

## 2014-07-17 MED ORDER — LISINOPRIL 20 MG PO TABS: 20.0000 mg | ORAL_TABLET | Freq: Every day | ORAL | Status: DC

## 2014-07-17 NOTE — Assessment & Plan Note (Signed)
Sudden increase in creatinine when in hospital ?related to hypertensive emergency Will restart the lisinopril if not worse No HCTZ though for now Stop the hydralazine since causing symptoms

## 2014-07-17 NOTE — Telephone Encounter (Signed)
Spoke with patient and advised results   

## 2014-07-17 NOTE — Assessment & Plan Note (Signed)
Had brief spell with BP over 220 and symptoms New renal insufficiency BP much better now--- repeat 130/82 on right despite stopping the amlodipine May need to check renal artery duplex

## 2014-07-17 NOTE — Progress Notes (Signed)
Pre visit review using our clinic review tool, if applicable. No additional management support is needed unless otherwise documented below in the visit note. 

## 2014-07-17 NOTE — Telephone Encounter (Signed)
Pt has appointment 6/6 with dr Alphonsus Siasletvak.  She wanted to know if she is to returned to work on Monday or wait till she sees dr Alphonsus Siasletvak

## 2014-07-17 NOTE — Patient Instructions (Signed)
Stop the hydralazine.  Start the lisinopril today--- I will let you know about your blood test by tomorrow afternoon. For now, don't take the clopidogrel (plavix).

## 2014-07-17 NOTE — Addendum Note (Signed)
Addended by: Liane ComberHAVERS, Abanoub Hanken C on: 07/17/2014 10:03 AM   Modules accepted: Orders

## 2014-07-17 NOTE — Assessment & Plan Note (Signed)
White matter changes are not really new Not sure she can tolerate clopidogrel--hold for now On ASA and statin

## 2014-07-17 NOTE — Telephone Encounter (Signed)
Have her wait till the appt and if things have calmed down--will send her back on Tuesday

## 2014-07-17 NOTE — Progress Notes (Signed)
Subjective:    Patient ID: Tami Jones, female    DOB: 1955/05/01, 59 y.o.   MRN: [id number]  HPI Here for hospital follow up Reviewed hospital records and recent clinic visit over the weekend Concern for CVA--but no new findings on MRI Chronic white matter changes---started on clopidogrel by neuro  Amlodipine started in hospital but stopped at clinic visit since BP low and seemed to be symptomatic Still feels bad-- okay in AM before her meds--but then once she takes her meds she feels "all over the place"  Concerned that the hydralazine Took it last night and it made her very restless and couldn't sleep Tried off the clopidogrel also--but not clear it is causing problems  Before hospital, she felt off--then her head felt "tight" and felt "like I was in a crowd" Felt jittery and could tell BP was up  Current Outpatient Prescriptions on File Prior to Visit  Medication Sig Dispense Refill  . aspirin 81 MG tablet Take 81 mg by mouth daily.      Marland Kitchen atorvastatin (LIPITOR) 20 MG tablet Take 1 tablet (20 mg total) by mouth daily. 90 tablet 3  . clopidogrel (PLAVIX) 75 MG tablet Take 1 tablet (75 mg total) by mouth daily. 30 tablet 0  . hydrALAZINE (APRESOLINE) 25 MG tablet Take 1 tablet (25 mg total) by mouth every 8 (eight) hours. 90 tablet 0  . lidocaine (LIDODERM) 5 % Place 1 patch onto the skin daily. 30 patch 11  . metoprolol succinate (TOPROL-XL) 100 MG 24 hr tablet Take 1 tablet (100 mg total) by mouth daily. Take with or immediately following a meal. 90 tablet 3   No current facility-administered medications on file prior to visit.    Allergies  Allergen Reactions  . Citalopram Other (See Comments)    intolerant  . Doxycycline Hyclate Other (See Comments)    REACTION: stomach upset  . Montelukast Sodium Other (See Comments)    REACTION: unspecified  . Nitrofurantoin Other (See Comments)    REACTION: unspecified  . Sulfa Antibiotics Nausea Only  . Tramadol Hcl Other  (See Comments)    REACTION: Can not tolerate  . Venlafaxine Hcl Other (See Comments)    Intolerant    Past Medical History  Diagnosis Date  . Diabetes mellitus   . GERD (gastroesophageal reflux disease)   . Hyperlipidemia   . CVA (cerebral vascular accident) 03/2006    right occipital, Dr. Thad Ranger  . Hypertension     Past Surgical History  Procedure Laterality Date  . Cesarean section    . Tonsillectomy and adenoidectomy    . Esophagogastroduodenoscopy  05/2005  . Cardiovascular stress test  1/15    myoview. EF 60%    Family History  Problem Relation Age of Onset  . Coronary artery disease Mother   . Diabetes Mellitus II Mother   . Diabetes Mellitus II Maternal Grandmother     History   Social History  . Marital Status: Divorced    Spouse Name: N/A  . Number of Children: 2  . Years of Education: N/A   Occupational History  . LAB [employer]    Social History Main Topics  . Smoking status: Never Smoker   . Smokeless tobacco: Never Used  . Alcohol Use: No     Comment: wine occassionally  . Drug Use: No  . Sexual Activity: Not on file   Other Topics Concern  . Not on file   Social History Narrative   Review of Systems  No vomiting but has nausea Appetite is off but able to eat Has had some soreness in left flank and "uncomfortable" across entire chest Has had easy bruising    Objective:   Physical Exam  Constitutional: She appears well-developed and well-nourished. No distress.  Neck: Normal range of motion. Neck supple. No thyromegaly present.  Cardiovascular: Normal rate, regular rhythm, normal heart sounds and intact distal pulses.  Exam reveals no gallop.   No murmur heard. Pulmonary/Chest: Effort normal and breath sounds normal. No respiratory distress. She has no wheezes. She has no rales.  Abdominal: Soft. There is no tenderness.  No renal bruits  Musculoskeletal: She exhibits no edema or tenderness.  Lymphadenopathy:    She has no cervical  adenopathy.          Assessment & Plan:

## 2014-07-18 ENCOUNTER — Other Ambulatory Visit: Payer: Self-pay | Admitting: *Deleted

## 2014-07-18 LAB — RENAL FUNCTION PANEL
Albumin: 4.2 g/dL (ref 3.5–5.5)
BUN / CREAT RATIO: 14 (ref 9–23)
BUN: 13 mg/dL (ref 6–24)
CO2: 26 mmol/L (ref 18–29)
Calcium: 9.2 mg/dL (ref 8.7–10.2)
Chloride: 101 mmol/L (ref 97–108)
Creatinine, Ser: 0.96 mg/dL (ref 0.57–1.00)
GFR, EST AFRICAN AMERICAN: 76 mL/min/{1.73_m2} (ref >59)
GFR, EST NON AFRICAN AMERICAN: 66 mL/min/{1.73_m2} (ref >59)
GLUCOSE: 119 mg/dL — AB (ref 65–99)
PHOSPHORUS: 3.8 mg/dL (ref 2.5–4.5)
Potassium: 4.4 mmol/L (ref 3.5–5.2)
SODIUM: 142 mmol/L (ref 134–144)

## 2014-07-18 MED ORDER — METOPROLOL SUCCINATE ER 100 MG PO TB24: 100.0000 mg | ORAL_TABLET | Freq: Every day | ORAL | Status: DC

## 2014-07-18 NOTE — Telephone Encounter (Signed)
Please check with her to see if I can fax it in on Monday after her visit--or if she needs it tomorrow

## 2014-07-18 NOTE — Telephone Encounter (Signed)
Spoke with patient about lab results, pt is now asking if her FMLA paperwork can be faxed in?

## 2014-07-19 NOTE — Telephone Encounter (Signed)
Spoke with patient and she will get the paperwork on Monday.

## 2014-07-22 ENCOUNTER — Telehealth: Payer: Self-pay

## 2014-07-22 ENCOUNTER — Encounter: Payer: Self-pay | Admitting: Internal Medicine

## 2014-07-22 ENCOUNTER — Ambulatory Visit (INDEPENDENT_AMBULATORY_CARE_PROVIDER_SITE_OTHER): Payer: 59 | Admitting: Internal Medicine

## 2014-07-22 VITALS — BP 134/64 | HR 67 | Temp 97.9°F | Wt 194.0 lb

## 2014-07-22 DIAGNOSIS — I679 Cerebrovascular disease, unspecified: Secondary | ICD-10-CM

## 2014-07-22 DIAGNOSIS — J0100 Acute maxillary sinusitis, unspecified: Secondary | ICD-10-CM | POA: Diagnosis not present

## 2014-07-22 DIAGNOSIS — I10 Essential (primary) hypertension: Secondary | ICD-10-CM

## 2014-07-22 MED ORDER — AMOXICILLIN 500 MG PO TABS: 1000.0000 mg | ORAL_TABLET | Freq: Two times a day (BID) | ORAL | Status: DC

## 2014-07-22 NOTE — Telephone Encounter (Signed)
Will evaluate today.

## 2014-07-22 NOTE — Assessment & Plan Note (Signed)
Has had ongoing symptoms since the hospital Not sure this is bacterial Will give written Rx for amoxil to start if she worsens

## 2014-07-22 NOTE — Patient Instructions (Signed)
Please start the amoxicillin antibiotic if you are worsening in the next few days. 

## 2014-07-22 NOTE — Assessment & Plan Note (Signed)
No new findings on CT scan On asa and statin Will hold off on the plavix

## 2014-07-22 NOTE — Progress Notes (Signed)
Pre visit review using our clinic review tool, if applicable. No additional management support is needed unless otherwise documented below in the visit note. 

## 2014-07-22 NOTE — Telephone Encounter (Signed)
Pt has appt 07/22/14 at 2 pm with Dr Alphonsus SiasLetvak.

## 2014-07-22 NOTE — Progress Notes (Signed)
Subjective:    Patient ID: Tami Jones, female    DOB: 12/09/1955, 59 y.o.   MRN: [id number]  HPI Here for follow up of HTN  Had sinus drainage since hospital More yellow stuff now Some cough Throat sore due to drainage No fever Mucus in throat--but cough mostly not productive Not SOB  Feeling better overall though Some occipital pain now No other headache  Feels better with the medication changes No chest pain Did have brief palpitations at bedtime 2 nights ago  Current Outpatient Prescriptions on File Prior to Visit  Medication Sig Dispense Refill  . aspirin 81 MG tablet Take 81 mg by mouth daily.      Marland Kitchen. atorvastatin (LIPITOR) 20 MG tablet Take 1 tablet (20 mg total) by mouth daily. 90 tablet 3  . clopidogrel (PLAVIX) 75 MG tablet Take 1 tablet (75 mg total) by mouth daily. 30 tablet 0  . lidocaine (LIDODERM) 5 % Place 1 patch onto the skin daily. 30 patch 11  . lisinopril (PRINIVIL,ZESTRIL) 20 MG tablet Take 1 tablet (20 mg total) by mouth daily. 90 tablet 3  . metoprolol succinate (TOPROL-XL) 100 MG 24 hr tablet Take 1 tablet (100 mg total) by mouth daily. Take with or immediately following a meal. 90 tablet 3   No current facility-administered medications on file prior to visit.    Allergies  Allergen Reactions  . Citalopram Other (See Comments)    intolerant  . Doxycycline Hyclate Other (See Comments)    REACTION: stomach upset  . Montelukast Sodium Other (See Comments)    REACTION: unspecified  . Nitrofurantoin Other (See Comments)    REACTION: unspecified  . Sulfa Antibiotics Nausea Only  . Tramadol Hcl Other (See Comments)    REACTION: Can not tolerate  . Venlafaxine Hcl Other (See Comments)    Intolerant    Past Medical History  Diagnosis Date  . Diabetes mellitus   . GERD (gastroesophageal reflux disease)   . Hyperlipidemia   . CVA (cerebral vascular accident) 03/2006    right occipital, Dr. Thad Rangereynolds  . Hypertension     Past Surgical  History  Procedure Laterality Date  . Cesarean section    . Tonsillectomy and adenoidectomy    . Esophagogastroduodenoscopy  05/2005  . Cardiovascular stress test  1/15    myoview. EF 60%    Family History  Problem Relation Age of Onset  . Coronary artery disease Mother   . Diabetes Mellitus II Mother   . Diabetes Mellitus II Maternal Grandmother     History   Social History  . Marital Status: Divorced    Spouse Name: N/A  . Number of Children: 2  . Years of Education: N/A   Occupational History  . LAB [employer]    Social History Main Topics  . Smoking status: Never Smoker   . Smokeless tobacco: Never Used  . Alcohol Use: No     Comment: wine occassionally  . Drug Use: No  . Sexual Activity: Not on file   Other Topics Concern  . Not on file   Social History Narrative   Review of Systems Appetite is not great Weight up slightly though Some nausea--? From the drainage    Objective:   Physical Exam  Constitutional: She appears well-nourished. No distress.  HENT:  Mouth/Throat: Oropharynx is clear and moist. No oropharyngeal exudate.  No sinus tenderness Moderate nasal inflammation TMs normal   Neck: Normal range of motion. Neck supple. No thyromegaly present.  Cardiovascular:  Normal rate, regular rhythm and normal heart sounds.  Exam reveals no gallop.   No murmur heard. Pulmonary/Chest: Effort normal and breath sounds normal. No respiratory distress. She has no wheezes. She has no rales.  Musculoskeletal: She exhibits no edema or tenderness.  Lymphadenopathy:    She has no cervical adenopathy.  Psychiatric: She has a normal mood and affect. Her behavior is normal.          Assessment & Plan:

## 2014-07-22 NOTE — Telephone Encounter (Signed)
PLEASE NOTE: All timestamps contained within this report are represented as [place] Standard Time. CONFIDENTIALTY NOTICE: This fax transmission is intended only for the addressee. It contains information that is legally privileged, confidential or otherwise protected from use or disclosure. If you are not the intended recipient, you are strictly prohibited from reviewing, disclosing, copying using or disseminating any of this information or taking any action in reliance on or regarding this information. If you have received this fax in error, please notify us immediately by telephone so that we can arrange for its return to us. Phone: [phone], Toll-Free: [phone], Fax: [phone] Page: 1 of 2 Call Id: [id number] [facility] - Client TELEPHONE ADVICE RECORD [facility] Patient Name: Tami BeechamCYNTHIA Jun Gender: Female DOB: 12/22/1955 Age: [age] Y 6 M 30 D Return Phone Number: [phone] (Primary) Address: [address] [place] [address] Client [place] Client [facility] Physician Tillman AbideLetvak, Richard Contact Type Call Call Type Triage / Clinical Relationship To Patient Self Return Phone Number [phone] (Primary) Chief Complaint Sore Throat Initial Comment Caller states, has a sore throat PreDisposition Go to ED Nurse Assessment Nurse: Cox, RN, [place] Date/Time (Eastern Time): 07/20/2014 7:20:07 AM Confirm and document reason for call. If symptomatic, describe symptoms. ---caller states she has a sore throat, was in hospital last week for sinus infection, has yellow drainage from nose Has the patient traveled out of the country within the last 30 days? ---No Does the patient require triage? ---Yes Related visit to physician within the last 2 weeks? ---No Does the PT have any chronic conditions? (i.e. diabetes, asthma, etc.) ---Yes List  chronic conditions. ---HTN, borderline diabetic Guidelines Guideline Title Affirmed Question Affirmed Notes Nurse Date/Time Lamount Cohen(Eastern Time) Sore Throat SEVERE (e.g., excruciating) throat pain Cox, RN, Allicon 07/20/2014 7:21:48 AM Disp. Time Lamount Cohen(Eastern Time) Disposition Final User 07/20/2014 7:24:58 AM See Physician within 24 Hours Yes Cox, RN, Academic librarianAllicon Caller Understands: Yes Disagree/Comply: Comply Care Advice Given Per Guideline SEE PHYSICIAN WITHIN 24 HOURS: * IF OFFICE WILL BE CLOSED AND NO PCP TRIAGE: You need to be examined within the next 24 hours. Go to _________ at your convenience. * Sip warm chicken broth or apple juice. SORE THROAT - For relief of sore throat: * Suck on hard candy or a throat lozenge (OTC). * Avoid cigarette smoke. PAIN OR FEVER MEDICINES: * PLEASE NOTE: All timestamps contained within this report are represented as [place] Standard Time. CONFIDENTIALTY NOTICE: This fax transmission is intended only for the addressee. It contains information that is legally privileged, confidential or otherwise protected from use or disclosure. If you are not the intended recipient, you are strictly prohibited from reviewing, disclosing, copying using or disseminating any of this information or taking any action in reliance on or regarding this information. If you have received this fax in error, please notify us immediately by telephone so that we can arrange for its return to us. Phone: [phone], Toll-Free: [phone], Fax: [phone] Page: 2 of 2 Call Id: [id number] Care Advice Given Per Guideline For pain and fever relief, take acetaminophen or ibuprofen. CALL BACK IF: * You become worse. CARE ADVICE given per Sore Throat (Adult) guideline. After Care Instructions Given Call Event Type User Date / Time Description

## 2014-07-22 NOTE — Assessment & Plan Note (Addendum)
BP Readings from Last 3 Encounters:  07/22/14 134/64  07/17/14 130/80  07/13/14 140/80   Much better now Tolerating current medications okay now Still can't explain the severity of her BP when at the hospital Back to work tomorrow  FMLA papers done

## 2014-07-30 ENCOUNTER — Telehealth: Payer: Self-pay | Admitting: Internal Medicine

## 2014-07-30 ENCOUNTER — Other Ambulatory Visit: Payer: Self-pay

## 2014-07-30 ENCOUNTER — Emergency Department: Payer: 59

## 2014-07-30 ENCOUNTER — Emergency Department
Admission: EM | Admit: 2014-07-30 | Discharge: 2014-07-30 | Disposition: A | Discharge status: Home / Self Care | Payer: 59 | Attending: Emergency Medicine | Admitting: Emergency Medicine

## 2014-07-30 ENCOUNTER — Encounter: Payer: Self-pay | Admitting: Emergency Medicine

## 2014-07-30 DIAGNOSIS — Z792 Long term (current) use of antibiotics: Secondary | ICD-10-CM | POA: Diagnosis not present

## 2014-07-30 DIAGNOSIS — I10 Essential (primary) hypertension: Secondary | ICD-10-CM | POA: Diagnosis not present

## 2014-07-30 DIAGNOSIS — E119 Type 2 diabetes mellitus without complications: Secondary | ICD-10-CM | POA: Diagnosis not present

## 2014-07-30 DIAGNOSIS — Z79899 Other long term (current) drug therapy: Secondary | ICD-10-CM | POA: Insufficient documentation

## 2014-07-30 DIAGNOSIS — Z7982 Long term (current) use of aspirin: Secondary | ICD-10-CM | POA: Insufficient documentation

## 2014-07-30 DIAGNOSIS — R0789 Other chest pain: Secondary | ICD-10-CM | POA: Diagnosis present

## 2014-07-30 LAB — BASIC METABOLIC PANEL
ANION GAP: 10 (ref 5–15)
BUN: 17 mg/dL (ref 6–20)
CO2: 28 mmol/L (ref 22–32)
Calcium: 9.3 mg/dL (ref 8.9–10.3)
Chloride: 103 mmol/L (ref 101–111)
Creatinine, Ser: 1.02 mg/dL — ABNORMAL HIGH (ref 0.44–1.00)
GFR calc Af Amer: >60 mL/min (ref >60)
GFR, EST NON AFRICAN AMERICAN: 60 mL/min — AB (ref >60)
Glucose, Bld: 128 mg/dL — ABNORMAL HIGH (ref 65–99)
Potassium: 3.6 mmol/L (ref 3.5–5.1)
SODIUM: 141 mmol/L (ref 135–145)

## 2014-07-30 LAB — CBC WITH DIFFERENTIAL/PLATELET
Basophils Absolute: 0 10*3/uL (ref 0–0.1)
Basophils Relative: 1 %
EOS ABS: 0.1 10*3/uL (ref 0–0.7)
EOS PCT: 2 %
HCT: 40.5 % (ref 35.0–47.0)
HEMOGLOBIN: 13.5 g/dL (ref 12.0–16.0)
Lymphocytes Relative: 40 %
Lymphs Abs: 1.9 10*3/uL (ref 1.0–3.6)
MCH: 30.6 pg (ref 26.0–34.0)
MCHC: 33.3 g/dL (ref 32.0–36.0)
MCV: 91.9 fL (ref 80.0–100.0)
MONOS PCT: 7 %
Monocytes Absolute: 0.3 10*3/uL (ref 0.2–0.9)
Neutro Abs: 2.4 10*3/uL (ref 1.4–6.5)
Neutrophils Relative %: 50 %
Platelets: 179 10*3/uL (ref 150–440)
RBC: 4.4 MIL/uL (ref 3.80–5.20)
RDW: 13.3 % (ref 11.5–14.5)
WBC: 4.7 10*3/uL (ref 3.6–11.0)

## 2014-07-30 LAB — TROPONIN I: Troponin I: <0.03 ng/mL (ref <0.031)

## 2014-07-30 NOTE — Telephone Encounter (Signed)
Pt coming in tomorrow

## 2014-07-30 NOTE — ED Notes (Signed)
Pt to ed via ems from home with c/o chest pain generalized with high blood pressure. Pt recently started on metoprolol. Ems applied 1 inch of nitro past to left upper chest. bp in route per ems 218/108, after nitro 175/100. Pt a/ox3 on arrival to ed.  edp at bedside.

## 2014-07-30 NOTE — ED Provider Notes (Signed)
[facility] Emergency Department Provider Note  ____________________________________________  Time seen: 8:30 AM  I have reviewed the triage vital signs and the nursing notes.   HISTORY  Chief Complaint Chest Pain    HPI Tami Jones is a 59 y.o. female who called EMS for a vague chest discomfort that started around 7:00 AM today. She notes that she's been having trouble controlling her blood pressure but has been compliant with her prescribed lisinopril and metoprolol. She denies shortness of breath vomiting diaphoresis or exertional pain. It is not pleuritic either. It's nonradiating, described as a "discomfort" in the entire anterior chest bilaterally. No aggravating or alleviating factors no other associated symptoms. No headache dizziness or blurry vision. No abdominal pain or back pain.     Past Medical History  Diagnosis Date  . Diabetes mellitus   . GERD (gastroesophageal reflux disease)   . Hyperlipidemia   . CVA (cerebral vascular accident) 03/2006    right occipital, Dr. Thad Ranger  . Hypertension     Patient Active Problem List   Diagnosis Date Noted  . Acute maxillary sinusitis 07/22/2014  . Acute kidney injury 07/17/2014  . Cerebrovascular disease 07/17/2014  . HTN (hypertension), malignant 07/10/2014  . Preventative health care 06/25/2014  . UTI (urinary tract infection) 06/25/2014  . Right sided sciatica 03/08/2013  . MDD (major depressive disorder) 09/27/2012  . MENOPAUSAL SYNDROME 09/10/2008  . Anxiety state, unspecified 09/15/2007  . Essential hypertension, benign 08/10/2006  . Diabetes mellitus type 2, controlled, without complications 06/17/2006  . HYPERCHOLESTEROLEMIA 06/17/2006  . GERD 06/17/2006    Past Surgical History  Procedure Laterality Date  . Cesarean section    . Tonsillectomy and adenoidectomy    . Esophagogastroduodenoscopy  05/2005  . Cardiovascular stress test  1/15    myoview. EF 60%    Current  Outpatient Rx  Name  Route  Sig  Dispense  Refill  . amoxicillin (AMOXIL) 500 MG tablet   Oral   Take 2 tablets (1,000 mg total) by mouth 2 (two) times daily.   40 tablet   0   . aspirin 81 MG tablet   Oral   Take 81 mg by mouth daily.           Marland Kitchen atorvastatin (LIPITOR) 20 MG tablet   Oral   Take 1 tablet (20 mg total) by mouth daily.   90 tablet   3   . lidocaine (LIDODERM) 5 %   Transdermal   Place 1 patch onto the skin daily.   30 patch   11   . lisinopril (PRINIVIL,ZESTRIL) 20 MG tablet   Oral   Take 1 tablet (20 mg total) by mouth daily.   90 tablet   3   . metoprolol succinate (TOPROL-XL) 100 MG 24 hr tablet   Oral   Take 1 tablet (100 mg total) by mouth daily. Take with or immediately following a meal.   90 tablet   3     Allergies Citalopram; Doxycycline hyclate; Montelukast sodium; Nitrofurantoin; Sulfa antibiotics; Tramadol hcl; and Venlafaxine hcl  Family History  Problem Relation Age of Onset  . Coronary artery disease Mother   . Diabetes Mellitus II Mother   . Diabetes Mellitus II Maternal Grandmother     Social History History  Substance Use Topics  . Smoking status: Never Smoker   . Smokeless tobacco: Never Used  . Alcohol Use: No     Comment: wine occassionally    Review of Systems  Constitutional:  No fever or chills. No weight changes Eyes:No blurry vision or double vision.  ENT: No sore throat. Cardiovascular: Chest pain as above, uncontrolled hypertension. Respiratory: No dyspnea or cough. Gastrointestinal: Negative for abdominal pain, vomiting and diarrhea.  No BRBPR or melena. Genitourinary: Negative for dysuria, urinary retention, bloody urine, or difficulty urinating. Musculoskeletal: Negative for back pain. No joint swelling or pain. Skin: Negative for rash. Neurological: Negative for headaches, focal weakness or numbness. Psychiatric:No anxiety or depression.   Endocrine:No hot/cold intolerance, changes in energy, or  sleep difficulty.  10-point ROS otherwise negative.  ____________________________________________   PHYSICAL EXAM:  VITAL SIGNS: ED Triage Vitals  Enc Vitals Group     BP 07/30/14 0845 184/92 mmHg     Pulse Rate 07/30/14 0845 67     Resp 07/30/14 0845 18     Temp 07/30/14 0845 98.3 F (36.8 C)     Temp Source 07/30/14 0845 Oral     SpO2 07/30/14 0845 100 %     Weight 07/30/14 0845 189 lb (85.73 kg)     Height 07/30/14 0845  (1.626 m)     Head Cir --      Peak Flow --      Pain Score 07/30/14 0846 2     Pain Loc --      Pain Edu? --      Excl. in GC? --      Constitutional: Alert and oriented. Well appearing and in no distress. Eyes: No scleral icterus. No conjunctival pallor. PERRL. EOMI ENT   Head: Normocephalic and atraumatic.   Nose: No congestion/rhinnorhea. No septal hematoma   Mouth/Throat: MMM, no pharyngeal erythema. No peritonsillar mass. No uvula shift.   Neck: No stridor. No SubQ emphysema. No meningismus. Hematological/Lymphatic/Immunilogical: No cervical lymphadenopathy. Cardiovascular: RRR. Normal and symmetric distal pulses are present in all extremities. No murmurs, rubs, or gallops. Respiratory: Normal respiratory effort without tachypnea nor retractions. Breath sounds are clear and equal bilaterally. No wheezes/rales/rhonchi. Gastrointestinal: Soft and nontender. No distention. There is no CVA tenderness.  No rebound, rigidity, or guarding. Genitourinary: deferred Musculoskeletal: Nontender with normal range of motion in all extremities. No joint effusions.  No lower extremity tenderness.  No edema. Neurologic:   Normal speech and language.  CN 2-10 normal. Motor grossly intact. No pronator drift.  Normal gait. No gross focal neurologic deficits are appreciated.  Skin:  Skin is warm, dry and intact. No rash noted.  No petechiae, purpura, or bullae. Psychiatric: Mood and affect are normal. Speech and behavior are normal. Patient  exhibits appropriate insight and judgment.  ____________________________________________    LABS (pertinent positives/negatives) (all labs ordered are listed, but only abnormal results are displayed) Labs Reviewed  BASIC METABOLIC PANEL - Abnormal; Notable for the following:    Glucose, Bld 128 (*)    Creatinine, Ser 1.02 (*)    GFR calc non Af Amer 60 (*)    All other components within normal limits  CBC WITH DIFFERENTIAL/PLATELET  TROPONIN I   ____________________________________________   EKG  Interpreted by me Normal sinus rhythm rate of 71, normal axis, normal intervals, poor R-wave progression in anterior leads, normal ST segments and T waves. There is LVH by voltage criteria.  ____________________________________________    RADIOLOGY  Chest x-ray unremarkable  ____________________________________________   PROCEDURES  ____________________________________________   INITIAL IMPRESSION / ASSESSMENT AND PLAN / ED COURSE  Pertinent labs & imaging results that were available during my care of the patient were reviewed by me and considered  in my medical decision making (see chart for details).  Patient presents with vague chest discomfort in the setting of markedly elevated blood pressure. She is been having difficulty controlling her hypertension recently due to multiple medication changes. She's been on her current regimen for a week. We will check labs and reassess her blood pressure.  ----------------------------------------- 10:25 AM on 07/30/2014 -----------------------------------------  Vital signs unremarkable, blood pressure 147/90. Patient feels back to baseline. Low suspicion of ACS PE TAD pneumothorax pneumonia sepsis or pericarditis. We'll discharge the patient home to continue her current regimen and follow up with primary care within 1 week. Discomfort likely due to elevated high blood pressure, which is now much better  controlled.  ____________________________________________   FINAL CLINICAL IMPRESSION(S) / ED DIAGNOSES  Final diagnoses:  Uncontrolled hypertension      Sharman Cheek, MD 07/30/14 1026

## 2014-07-30 NOTE — Telephone Encounter (Signed)
PLEASE NOTE: All timestamps contained within this report are represented as [place] Standard Time. CONFIDENTIALTY NOTICE: This fax transmission is intended only for the addressee. It contains information that is legally privileged, confidential or otherwise protected from use or disclosure. If you are not the intended recipient, you are strictly prohibited from reviewing, disclosing, copying using or disseminating any of this information or taking any action in reliance on or regarding this information. If you have received this fax in error, please notify us immediately by telephone so that we can arrange for its return to [place]. Phone: [phone], Toll-Free: [phone], Fax: [phone] Page: 1 of 2 Call Id: [id number] [facility] Night - Client TELEPHONE ADVICE RECORD [facility] Medical Call Center Patient Name: Tami Jones Gender: Female DOB: February 09, 1956 Age: 59 Y 7 M 9 D Return Phone Number: [phone] (Primary) Address: [address] City/State/Zip: [place] [address] Client [facility] Night - Client Client Site [facility] - Night Physician Tillman Abide Contact Type Call Call Type Triage / Clinical Relationship To Patient Self Return Phone Number [phone] (Primary) Chief Complaint BLOOD PRESSURE HIGH - Diastolic (bottom number) 120 or greater (with symptoms) Initial Comment Caller states her BP 208/101. She is not feeling well. PreDisposition Go to ED Nurse Assessment Nurse: Apolinar Junes, RN, Darl Pikes Date/Time Lamount Cohen Time): 07/30/2014 7:35:00 AM Confirm and document reason for call. If symptomatic, describe symptoms. ---Caller states her BP 208/101. She is not feeling well - states her neck is hurting and having some L arm pain and chest discomfort - she has been in the hospital recently for high blood pressure can not see to get it down Has the patient traveled out of the country within the last  30 days? ---Not Applicable Does the patient require triage? ---Yes Related visit to physician within the last 2 weeks? ---Yes Does the PT have any chronic conditions? (i.e. diabetes, asthma, etc.) ---Yes List chronic conditions. ---see EMR Guidelines Guideline Title Affirmed Question Affirmed Notes Nurse Date/Time (Eastern Time) High Blood Pressure [1] Chest pain lasts > 5 minutes AND [2] history of heart disease (i.e., heart attack, bypass surgery, angina, angioplasty, CHF) Alver Sorrow 07/30/2014 7:35:37 AM Disp. Time Lamount Cohen Time) Disposition Final User 07/30/2014 7:33:30 AM Send to Urgent Tenna Child 07/30/2014 7:38:10 AM Call EMS 911 Now Alver Sorrow 07/30/2014 7:39:38 AM Send To RN Personal Apolinar Junes, RN, Darl Pikes 07/30/2014 7:57:41 AM 911 Outcome Documentation Apolinar Junes, RN, Darl Pikes Reason: call back went to VM - box full no way to leave message PLEASE NOTE: All timestamps contained within this report are represented as [place] Standard Time. CONFIDENTIALTY NOTICE: This fax transmission is intended only for the addressee. It contains information that is legally privileged, confidential or otherwise protected from use or disclosure. If you are not the intended recipient, you are strictly prohibited from reviewing, disclosing, copying using or disseminating any of this information or taking any action in reliance on or regarding this information. If you have received this fax in error, please notify us immediately by telephone so that we can arrange for its return to [place]. Phone: [phone], Toll-Free: [phone], Fax: [phone] Page: 2 of 2 Call Id: [id number] 07/30/2014 7:57:53 AM Clinical Call Yes Apolinar Junes, RN, Helane Rima Understands: Yes Disagree/Comply: Comply Care Advice Given Per Guideline CALL EMS 911 NOW: Immediate medical attention is needed. You need to hang up and call 911 (or an ambulance). (Triager Discretion: I'll call you back in a few minutes to be  sure you were able to reach them.) CARE ADVICE given per High Blood Pressure (Adult) guideline. After Care Instructions Given Call Event Type User Date / Time Description Comments User: Brock Ra, RN Date/Time Lamount Cohen Time): 07/30/2014 7:40:12 AM caller wanted dgt to take to ER - advised against that - advised her she needs immediate attention

## 2014-07-30 NOTE — Telephone Encounter (Signed)
She was evaluated in ER and then discharged  Find out how she is doing and make sure she has appt in the next few days to recheck her BP

## 2014-07-30 NOTE — Telephone Encounter (Signed)
[facility] Night - Client TELEPHONE ADVICE RECORD [facility] Call Center Patient Name: Tami Jones DOB: 07/19/1955 Initial Comment Caller states her BP 208/101. She is not feeling well. Nurse Assessment Nurse: Apolinar Junes, RN, Darl Pikes Date/Time Lamount Cohen Time): 07/30/2014 7:35:00 AM Confirm and document reason for call. If symptomatic, describe symptoms. ---Caller states her BP 208/101. She is not feeling well - states her neck is hurting and having some L arm pain and chest discomfort - she has been in the hospital recently for high blood pressure can not see to get it down Has the patient traveled out of the country within the last 30 days? ---Not Applicable Does the patient require triage? ---Yes Related visit to physician within the last 2 weeks? ---Yes Does the PT have any chronic conditions? (i.e. diabetes, asthma, etc.) ---Yes List chronic conditions. ---see EMR Guidelines Guideline Title Affirmed Question Affirmed Notes High Blood Pressure [1] Chest pain lasts > 5 minutes AND [2] history of heart disease (i.e., heart attack, bypass surgery, angina, angioplasty, CHF) Final Disposition User Clinical Call Apolinar Junes, RN, Darl Pikes Comments caller wanted dgt to take to ER - advised against that - advised her she needs immediate attention

## 2014-07-30 NOTE — Discharge Instructions (Signed)

## 2014-07-31 ENCOUNTER — Encounter: Payer: Self-pay | Admitting: Internal Medicine

## 2014-07-31 ENCOUNTER — Ambulatory Visit (INDEPENDENT_AMBULATORY_CARE_PROVIDER_SITE_OTHER): Payer: 59 | Admitting: Internal Medicine

## 2014-07-31 VITALS — BP 120/80 | HR 59 | Temp 98.0°F | Wt 191.0 lb

## 2014-07-31 DIAGNOSIS — Z1211 Encounter for screening for malignant neoplasm of colon: Secondary | ICD-10-CM | POA: Diagnosis not present

## 2014-07-31 DIAGNOSIS — I10 Essential (primary) hypertension: Secondary | ICD-10-CM | POA: Diagnosis not present

## 2014-07-31 NOTE — Addendum Note (Signed)
Addended by: Baldomero Lamy on: 07/31/2014 10:45 AM   Modules accepted: Orders

## 2014-07-31 NOTE — Progress Notes (Signed)
Subjective:    Patient ID: Tami Jones, female    DOB: Sep 14, 1955, 59 y.o.   MRN: [id number]  HPI Here for ER follow up Reviewed records  She could tell it was high--- couldn't get to sleep and had to pace around Head felt funny--neck and back of head feel funny Gets flushed feeling No diarrhea  Daughter took her to ER after calling here Was elevated upon arrival but came down with nitropaste No meds changed  No recurrence of the feeling since leaving the ER Doesn't feel great though Hasn't missed meds  Current Outpatient Prescriptions on File Prior to Visit  Medication Sig Dispense Refill  . aspirin 81 MG tablet Take 81 mg by mouth daily.      Marland Kitchen atorvastatin (LIPITOR) 20 MG tablet Take 1 tablet (20 mg total) by mouth daily. 90 tablet 3  . lidocaine (LIDODERM) 5 % Place 1 patch onto the skin daily. 30 patch 11  . lisinopril (PRINIVIL,ZESTRIL) 20 MG tablet Take 1 tablet (20 mg total) by mouth daily. 90 tablet 3  . metoprolol succinate (TOPROL-XL) 100 MG 24 hr tablet Take 1 tablet (100 mg total) by mouth daily. Take with or immediately following a meal. 90 tablet 3   No current facility-administered medications on file prior to visit.    Allergies  Allergen Reactions  . Citalopram Other (See Comments)    intolerant  . Doxycycline Hyclate Other (See Comments)    REACTION: stomach upset  . Montelukast Sodium Other (See Comments)    REACTION: unspecified  . Nitrofurantoin Other (See Comments)    REACTION: unspecified  . Sulfa Antibiotics Nausea Only  . Tramadol Hcl Other (See Comments)    REACTION: Can not tolerate  . Venlafaxine Hcl Other (See Comments)    Intolerant    Past Medical History  Diagnosis Date  . Diabetes mellitus   . GERD (gastroesophageal reflux disease)   . Hyperlipidemia   . CVA (cerebral vascular accident) 03/2006    right occipital, Dr. Thad Ranger  . Hypertension     Past Surgical History  Procedure Laterality Date  . Cesarean section      . Tonsillectomy and adenoidectomy    . Esophagogastroduodenoscopy  05/2005  . Cardiovascular stress test  1/15    myoview. EF 60%    Family History  Problem Relation Age of Onset  . Coronary artery disease Mother   . Diabetes Mellitus II Mother   . Diabetes Mellitus II Maternal Grandmother     History   Social History  . Marital Status: Divorced    Spouse Name: N/A  . Number of Children: 2  . Years of Education: N/A   Occupational History  . LAB [employer]    Social History Main Topics  . Smoking status: Never Smoker   . Smokeless tobacco: Never Used  . Alcohol Use: No     Comment: wine occassionally  . Drug Use: No  . Sexual Activity: Not on file   Other Topics Concern  . Not on file   Social History Narrative   Review of Systems  Got back to work but out today and yesterday Not sleeping well--only 4 hours but then awakens Appetite is not great--- will get belching and stomach upset after eating     Objective:   Physical Exam  Constitutional: She appears well-developed and well-nourished. No distress.  Neck: Normal range of motion. Neck supple. No thyromegaly present.  Cardiovascular: Normal rate, regular rhythm and normal heart sounds.  Exam  reveals no gallop.   No murmur heard. Pulmonary/Chest: Effort normal and breath sounds normal. No respiratory distress. She has no wheezes. She has no rales.  Abdominal:  No renal bruits  Lymphadenopathy:    She has no cervical adenopathy.  Psychiatric: She has a normal mood and affect. Her behavior is normal.          Assessment & Plan:

## 2014-07-31 NOTE — Addendum Note (Signed)
Addended by: Sueanne Margarita on: 07/31/2014 11:21 AM   Modules accepted: Orders

## 2014-07-31 NOTE — Assessment & Plan Note (Signed)
Very unusual pattern of paroxysms of head symptoms and markedly elevated BP Then comes down Repeat here 140/84 on left---couldn't hear it on right  Needs further eval Will do urinary testing to check for pheo Check renal arteries Will set up cardiology eval

## 2014-07-31 NOTE — Progress Notes (Signed)
Pre visit review using our clinic review tool, if applicable. No additional management support is needed unless otherwise documented below in the visit note. 

## 2014-08-01 ENCOUNTER — Telehealth: Payer: Self-pay | Admitting: Internal Medicine

## 2014-08-01 NOTE — Telephone Encounter (Signed)
PLEASE NOTE: All timestamps contained within this report are represented as [place] Standard Time. CONFIDENTIALTY NOTICE: This fax transmission is intended only for the addressee. It contains information that is legally privileged, confidential or otherwise protected from use or disclosure. If you are not the intended recipient, you are strictly prohibited from reviewing, disclosing, copying using or disseminating any of this information or taking any action in reliance on or regarding this information. If you have received this fax in error, please notify us immediately by telephone so that we can arrange for its return to [place]. Phone: [phone], Toll-Free: [phone], Fax: [phone] Page: 1 of 1 Call Id: [id number] [facility] Day - Client TELEPHONE ADVICE RECORD [facility] Call Center Patient Name: Tami Jones DOB: [birth year] Initial Comment Caller states she is having nausea and light headed w/ pain in back of the neck. Tooth broken and is causing pain. Nurse Assessment Nurse: Orvis Brill, RN, Olegario Messier Date/Time Lamount Cohen Time): 08/01/2014 3:22:39 PM Confirm and document reason for call. If symptomatic, describe symptoms. ---Caller states that she has been having an ongoing problem with neck pain for past 3-4 wks, was seen in office yesterday for this. Reports that she has HTN & was hospitalized this past wk & a couple of wks ago for her HTN. Reports that she has an appt with a cardiologist next wk & has an ultrasound of her kidney scheduled for next wk. Has been having dizziness today, reports that her BS was 124 a couple of hrs ago. Asked to recheck her BS now. Also has 2 broken teeth that are causing her to have toothaches but has not been seen by dentist for this yet. Had onset of nausea without vomiting approx 3 hrs ago. Current BS is 136. Dizziness is main symptom now. Has the patient traveled out of the country within the last 30 days? ---No Does the  patient require triage? ---Yes Related visit to physician within the last 2 weeks? ---Yes Does the PT have any chronic conditions? (i.e. diabetes, asthma, etc.) ---Yes List chronic conditions. 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 Hours [place], Charity fundraiser, [employer]

## 2014-08-01 NOTE — Telephone Encounter (Signed)
Spoke with pt  She stated she was out of work 07/30/14 and 07/31/14 returned to work 08/01/14. She wanted to get intermittent fmla  [employer] fax attending provider statement  In Dr Tami Jones IN BOX For review and signature  Please return to Tami Jones I will be out of the office Friday and Monday

## 2014-08-01 NOTE — Telephone Encounter (Signed)
Tried calling patient, VM is full will try again tomorrow.

## 2014-08-01 NOTE — Telephone Encounter (Signed)
Please check on her Set her up tomorrow if needed

## 2014-08-01 NOTE — Telephone Encounter (Signed)
Patient has appt for tomorrow. ?

## 2014-08-01 NOTE — Telephone Encounter (Signed)
Pt called stating she needs a letter sent to [employer] that she can return to work on 07/23/14 Phone # [phone]  She was out 2 days this week.  [employer] will be faxing more paper work

## 2014-08-02 ENCOUNTER — Telehealth: Payer: Self-pay | Admitting: Internal Medicine

## 2014-08-02 ENCOUNTER — Ambulatory Visit: Payer: Self-pay | Admitting: Internal Medicine

## 2014-08-02 DIAGNOSIS — Z0289 Encounter for other administrative examinations: Secondary | ICD-10-CM

## 2014-08-02 NOTE — Telephone Encounter (Signed)
Patient did not come in for their appointment today for dizziness.  Please let me know if patient needs to be contacted immediately for follow up or no follow up needed.

## 2014-08-02 NOTE — Telephone Encounter (Signed)
Spoke with patient and she went to [facility] hospital last night, per pt they gave her medication at [facility] versus her having to go home and take the meds? Pt states her BP went up again and she had to be seen. Per care everywhere her BP was 139/77 @ 2:33am.

## 2014-08-02 NOTE — Telephone Encounter (Signed)
Please check to see if she is okay now

## 2014-08-03 NOTE — Telephone Encounter (Signed)
Just check on her on Monday AM She has her cardiology eval next week

## 2014-08-04 ENCOUNTER — Emergency Department: Payer: 59

## 2014-08-04 ENCOUNTER — Emergency Department
Admission: EM | Admit: 2014-08-04 | Discharge: 2014-08-04 | Disposition: A | Discharge status: Home / Self Care | Payer: 59 | Attending: Emergency Medicine | Admitting: Emergency Medicine

## 2014-08-04 ENCOUNTER — Encounter: Payer: Self-pay | Admitting: Emergency Medicine

## 2014-08-04 ENCOUNTER — Other Ambulatory Visit: Payer: Self-pay

## 2014-08-04 DIAGNOSIS — M79622 Pain in left upper arm: Secondary | ICD-10-CM | POA: Insufficient documentation

## 2014-08-04 DIAGNOSIS — Z79899 Other long term (current) drug therapy: Secondary | ICD-10-CM | POA: Insufficient documentation

## 2014-08-04 DIAGNOSIS — R079 Chest pain, unspecified: Secondary | ICD-10-CM | POA: Diagnosis present

## 2014-08-04 DIAGNOSIS — N390 Urinary tract infection, site not specified: Secondary | ICD-10-CM

## 2014-08-04 DIAGNOSIS — E119 Type 2 diabetes mellitus without complications: Secondary | ICD-10-CM | POA: Diagnosis not present

## 2014-08-04 DIAGNOSIS — M546 Pain in thoracic spine: Secondary | ICD-10-CM | POA: Diagnosis not present

## 2014-08-04 DIAGNOSIS — R112 Nausea with vomiting, unspecified: Secondary | ICD-10-CM | POA: Diagnosis not present

## 2014-08-04 DIAGNOSIS — Z7982 Long term (current) use of aspirin: Secondary | ICD-10-CM | POA: Diagnosis not present

## 2014-08-04 LAB — CBC WITH DIFFERENTIAL/PLATELET
BASOS PCT: 0 %
Basophils Absolute: 0 10*3/uL (ref 0–0.1)
EOS ABS: 0.1 10*3/uL (ref 0–0.7)
EOS PCT: 1 %
HEMATOCRIT: 43 % (ref 35.0–47.0)
HEMOGLOBIN: 14.5 g/dL (ref 12.0–16.0)
Lymphocytes Relative: 25 %
Lymphs Abs: 1.7 10*3/uL (ref 1.0–3.6)
MCH: 30.9 pg (ref 26.0–34.0)
MCHC: 33.7 g/dL (ref 32.0–36.0)
MCV: 91.6 fL (ref 80.0–100.0)
Monocytes Absolute: 0.3 10*3/uL (ref 0.2–0.9)
Monocytes Relative: 5 %
NEUTROS ABS: 4.8 10*3/uL (ref 1.4–6.5)
NEUTROS PCT: 69 %
Platelets: 180 10*3/uL (ref 150–440)
RBC: 4.69 MIL/uL (ref 3.80–5.20)
RDW: 13 % (ref 11.5–14.5)
WBC: 6.9 10*3/uL (ref 3.6–11.0)

## 2014-08-04 LAB — LIPASE, BLOOD: Lipase: 37 U/L (ref 22–51)

## 2014-08-04 LAB — COMPREHENSIVE METABOLIC PANEL
ALK PHOS: 77 U/L (ref 38–126)
ALT: 22 U/L (ref 14–54)
ANION GAP: 6 (ref 5–15)
AST: 21 U/L (ref 15–41)
Albumin: 4.1 g/dL (ref 3.5–5.0)
BUN: 19 mg/dL (ref 6–20)
CALCIUM: 9.1 mg/dL (ref 8.9–10.3)
CO2: 31 mmol/L (ref 22–32)
Chloride: 101 mmol/L (ref 101–111)
Creatinine, Ser: 1.05 mg/dL — ABNORMAL HIGH (ref 0.44–1.00)
GFR, EST NON AFRICAN AMERICAN: 58 mL/min — AB (ref >60)
Glucose, Bld: 147 mg/dL — ABNORMAL HIGH (ref 65–99)
Potassium: 3.6 mmol/L (ref 3.5–5.1)
Sodium: 138 mmol/L (ref 135–145)
Total Bilirubin: 0.3 mg/dL (ref 0.3–1.2)
Total Protein: 7.2 g/dL (ref 6.5–8.1)

## 2014-08-04 LAB — URINALYSIS COMPLETE WITH MICROSCOPIC (ARMC ONLY)
Bacteria, UA: NONE SEEN
Bilirubin Urine: NEGATIVE
GLUCOSE, UA: NEGATIVE mg/dL
Hgb urine dipstick: NEGATIVE
Ketones, ur: NEGATIVE mg/dL
Nitrite: NEGATIVE
PH: 5 (ref 5.0–8.0)
Protein, ur: NEGATIVE mg/dL
SPECIFIC GRAVITY, URINE: 1.012 (ref 1.005–1.030)

## 2014-08-04 LAB — TROPONIN I

## 2014-08-04 LAB — FIBRIN DERIVATIVES D-DIMER (ARMC ONLY): FIBRIN DERIVATIVES D-DIMER (ARMC): 282 (ref 0–499)

## 2014-08-04 MED ORDER — DIAZEPAM 5 MG/ML IJ SOLN
2.0000 mg | Freq: Once | INTRAMUSCULAR | Status: AC
  Administered 2014-08-04: 2 mg via INTRAVENOUS

## 2014-08-04 MED ORDER — FOSFOMYCIN TROMETHAMINE 3 G PO PACK
3.0000 g | PACK | Freq: Once | ORAL | Status: AC
  Administered 2014-08-04: 3 g via ORAL

## 2014-08-04 MED ORDER — ONDANSETRON HCL 4 MG PO TABS: 4.0000 mg | ORAL_TABLET | Freq: Every day | ORAL | Status: DC | PRN

## 2014-08-04 MED ORDER — DIAZEPAM 5 MG/ML IJ SOLN
INTRAMUSCULAR | Status: AC
  Administered 2014-08-04: 2 mg via INTRAVENOUS
  Filled 2014-08-04: qty 2

## 2014-08-04 MED ORDER — CYCLOBENZAPRINE HCL 10 MG PO TABS: 10.0000 mg | ORAL_TABLET | Freq: Three times a day (TID) | ORAL | Status: DC | PRN

## 2014-08-04 MED ORDER — ASPIRIN 81 MG PO CHEW
324.0000 mg | CHEWABLE_TABLET | Freq: Once | ORAL | Status: AC
  Administered 2014-08-04: 324 mg via ORAL

## 2014-08-04 MED ORDER — ASPIRIN 81 MG PO CHEW
CHEWABLE_TABLET | ORAL | Status: AC
  Administered 2014-08-04: 324 mg via ORAL
  Filled 2014-08-04: qty 4

## 2014-08-04 MED ORDER — ONDANSETRON HCL 4 MG/2ML IJ SOLN
INTRAMUSCULAR | Status: AC
  Administered 2014-08-04: 4 mg via INTRAVENOUS
  Filled 2014-08-04: qty 2

## 2014-08-04 MED ORDER — FOSFOMYCIN TROMETHAMINE 3 G PO PACK
PACK | ORAL | Status: AC
  Administered 2014-08-04: 3 g via ORAL
  Filled 2014-08-04: qty 3

## 2014-08-04 MED ORDER — ONDANSETRON HCL 4 MG/2ML IJ SOLN
4.0000 mg | Freq: Once | INTRAMUSCULAR | Status: AC
  Administered 2014-08-04: 4 mg via INTRAVENOUS

## 2014-08-04 NOTE — ED Provider Notes (Signed)
-----------------------------------------   8:28 AM on 08/04/2014 -----------------------------------------  Patient care is him from Dr. Langston Masker. Urinalysis has resulted showing a mild urinary tract infection, d-dimer has resulted negative. Patient has been dosed with a one-time dose of fosfomycin, no further antibiotics are required. Patient has follow-up with her primary care physician this week. We will discharge the patient home at this time.  Minna Antis, MD 08/04/14 [phone]

## 2014-08-04 NOTE — ED Notes (Signed)
Pt presents with c/o pain to left axiallary area-sharp; dull pain to left chest; pain radiates up into left neck; also feeling weak and lightheaded; pt has been seen at an ER twice in the last week, once here and once at [facility]; discharged both times with diagnosis of HTN; pt has vomited twice since pain started tonight around midnight;

## 2014-08-04 NOTE — Discharge Instructions (Signed)

## 2014-08-04 NOTE — ED Provider Notes (Addendum)
[facility] Emergency Department Provider Note  ____________________________________________  Time seen: Approximately 520 AM  I have reviewed the triage vital signs and the nursing notes.   HISTORY  Chief Complaint Nausea; Emesis; Chest Pain; and Weakness    HPI Tami Jones is a 59 y.o. female with a history of reflux and bilateral occipital infarcts presents with chest pain over the past week. Says the chest pain is left-sided and to the left axilla and is radiating into the left neck and feels sharp and dull. Said had 2 episodes tonight it lasted about an hour each which were associated with nausea vomiting and shortness of breath. Pain is now resolved. No exacerbating factors. No recent heavy lifting. No history of CAD. Recent visit to both [facility] and [facility] for blood pressure management and discharged for follow-up. Patient has appointment to follow-up for a renal ultrasound. No pleuritic chest pain. Pain not worsened with motion. Denies rash.   Past Medical History  Diagnosis Date  . Diabetes mellitus   . GERD (gastroesophageal reflux disease)   . Hyperlipidemia   . CVA (cerebral vascular accident) 03/2006    right occipital, Dr. Thad Ranger  . Hypertension     Patient Active Problem List   Diagnosis Date Noted  . Acute kidney injury 07/17/2014  . Cerebrovascular disease 07/17/2014  . HTN (hypertension), malignant 07/10/2014  . Preventative health care 06/25/2014  . UTI (urinary tract infection) 06/25/2014  . Right sided sciatica 03/08/2013  . MDD (major depressive disorder) 09/27/2012  . MENOPAUSAL SYNDROME 09/10/2008  . Anxiety state, unspecified 09/15/2007  . Essential hypertension, benign 08/10/2006  . Diabetes mellitus type 2, controlled, without complications 06/17/2006  . HYPERCHOLESTEROLEMIA 06/17/2006  . GERD 06/17/2006    Past Surgical History  Procedure Laterality Date  . Cesarean section    . Tonsillectomy and adenoidectomy     . Esophagogastroduodenoscopy  05/2005  . Cardiovascular stress test  1/15    myoview. EF 60%    Current Outpatient Rx  Name  Route  Sig  Dispense  Refill  . aspirin 81 MG tablet   Oral   Take 81 mg by mouth daily.           Marland Kitchen lidocaine (LIDODERM) 5 %   Transdermal   Place 1 patch onto the skin daily. Patient taking differently: Place 1 patch onto the skin daily as needed (pain).    30 patch   11   . lisinopril (PRINIVIL,ZESTRIL) 20 MG tablet   Oral   Take 1 tablet (20 mg total) by mouth daily. Patient taking differently: Take 20 mg by mouth 2 (two) times daily.    90 tablet   3   . metoprolol succinate (TOPROL-XL) 100 MG 24 hr tablet   Oral   Take 1 tablet (100 mg total) by mouth daily. Take with or immediately following a meal.   90 tablet   3   . atorvastatin (LIPITOR) 20 MG tablet   Oral   Take 1 tablet (20 mg total) by mouth daily.   90 tablet   3     Allergies Citalopram; Doxycycline hyclate; Montelukast sodium; Nitrofurantoin; Sulfa antibiotics; Tramadol hcl; and Venlafaxine hcl  Family History  Problem Relation Age of Onset  . Coronary artery disease Mother   . Diabetes Mellitus II Mother   . Diabetes Mellitus II Maternal Grandmother     Social History History  Substance Use Topics  . Smoking status: Never Smoker   . Smokeless tobacco: Never Used  .  Alcohol Use: No     Comment: wine occassionally    Review of Systems Constitutional: No fever/chills Eyes: No visual changes. ENT: No sore throat. Cardiovascular: As above  Respiratory: As above  Gastrointestinal: No abdominal pain.    No diarrhea.  No constipation. Genitourinary: Negative for dysuria. Musculoskeletal: Negative for back pain. Skin: Negative for rash. Neurological: Negative for headaches, focal weakness or numbness.  10-point ROS otherwise negative.  ____________________________________________   PHYSICAL EXAM:  VITAL SIGNS: ED Triage Vitals  Enc Vitals Group     BP  08/04/14 0424 161/83 mmHg     Pulse Rate 08/04/14 0424 66     Resp 08/04/14 0424 18     Temp 08/04/14 0424 98.4 F (36.9 C)     Temp Source 08/04/14 0424 Oral     SpO2 08/04/14 0419 97 %     Weight 08/04/14 0424 189 lb (85.73 kg)     Height 08/04/14 0424  (1.626 m)     Head Cir --      Peak Flow --      Pain Score 08/04/14 0425 5     Pain Loc --      Pain Edu? --      Excl. in GC? --     Constitutional: Alert and oriented. Well appearing and in no acute distress. Eyes: Conjunctivae are normal. PERRL. EOMI. Head: Atraumatic. Nose: No congestion/rhinnorhea. Mouth/Throat: Mucous membranes are moist.  Oropharynx non-erythematous. Neck: No stridor.   Cardiovascular: Normal rate, regular rhythm. Grossly normal heart sounds.  Good peripheral circulation. Tender to palpation to the left axilla as well as the left thoracic back. No midline tenderness or step-off or deformity. Respiratory: Normal respiratory effort.  No retractions. Lungs CTAB. Gastrointestinal: Soft and nontender. No distention. No abdominal bruits. No CVA tenderness. Musculoskeletal: No lower extremity tenderness nor edema.  No joint effusions. Neurologic:  Normal speech and language. No gross focal neurologic deficits are appreciated. Speech is normal. No gait instability. Skin:  Skin is warm, dry and intact. No rash noted. Psychiatric: Mood and affect are normal. Speech and behavior are normal.  ____________________________________________   LABS (all labs ordered are listed, but only abnormal results are displayed)  Labs Reviewed  COMPREHENSIVE METABOLIC PANEL - Abnormal; Notable for the following:    Glucose, Bld 147 (*)    Creatinine, Ser 1.05 (*)    GFR calc non Af Amer 58 (*)    All other components within normal limits  CBC WITH DIFFERENTIAL/PLATELET  LIPASE, BLOOD  TROPONIN I  FIBRIN DERIVATIVES D-DIMER ([facility] ONLY)  URINALYSIS COMPLETEWITH MICROSCOPIC ([facility] ONLY)    ____________________________________________  EKG  ED ECG REPORT I, Arelia Longest, the attending physician, personally viewed and interpreted this ECG.   Date: 08/04/2014  EKG Time: 431  Rate: 57  Rhythm: Sinus bradycardia  Axis: Normal axis  Intervals:none  ST&T Change: Minimal voltage criteria for LVH which may be normal variant.  _________ no change from EKG done earlier this month. ___________________________________  RADIOLOGY  Chest x-ray NAD. ____________________________________________   PROCEDURES    ____________________________________________   INITIAL IMPRESSION / ASSESSMENT AND PLAN / ED COURSE  Pertinent labs & imaging results that were available during my care of the patient were reviewed by me and considered in my medical decision making (see chart for details).  ----------------------------------------- 7:34 AM on 08/04/2014 -----------------------------------------  Pending remainder labs. Signed patient out to Dr. Lenard Lance who will follow labs and disposition accordingly. ____________________________________________   FINAL CLINICAL IMPRESSION(S) /  ED DIAGNOSES  Acute chest pain. Acute nausea and vomiting. Initial visit.    Myrna Blazer, MD 08/04/14 [phone]  Patient improving with Valium. Possible chest wall component. Pending urinalysis.  Myrna Blazer, MD 08/04/14 [phone]

## 2014-08-05 ENCOUNTER — Telehealth: Payer: Self-pay

## 2014-08-05 NOTE — Telephone Encounter (Signed)
Pt saw Dr Alphonsus Sias last week and was told that Dr Alphonsus Sias would fill out intermittent FMLA paperwork; pt wants to know status of paperwork that should have been sent by [employer] (they handle FMLA at pts work). Pt is out of work today and is not sure if she will be able to return to work on 08/06/14. Pt said if have not received FMLA paperwork yet pt said she will bring FMLA papers on 08/06/14. Pt said today pt continues to hurt in her neck and was seen at [facility] ED this past weekend for treatment of UTI. Pt request cb. Pt has appt to see cardiologist on 08/15/14.

## 2014-08-05 NOTE — Telephone Encounter (Signed)
Spoke with patient and she went to [facility], and states she's not feeling herself so that's why she didn't go to work today. Pt states she doesn't want to go to work and get sick. She states the ER only gave her one dose of "powdered medication" for her UTI and she's just not feeling herself and doesn't know if she should go tomorrow.

## 2014-08-05 NOTE — Telephone Encounter (Signed)
Spoke with Dr. Alphonsus Sias verbally and pt either needs to schedule an appt of go to work.  Spoke with patient and she will try going to work and if she doesn't feel good then she will go home. I advised pt that we only have 1 opening tomorrow and she will call for appt if needed.

## 2014-08-06 NOTE — Telephone Encounter (Signed)
Will do at her visit tomorrow

## 2014-08-06 NOTE — Telephone Encounter (Signed)
[employer] faxed [employer] with pt she stated she went to [facility] er 08/01/14 and is still out of work Pt made appointment to see Dr Alphonsus Sias 08/07/14 @ 12:30  Form in Dr Karle Starch IN BOX to be reviewed and signed

## 2014-08-07 ENCOUNTER — Telehealth: Payer: Self-pay | Admitting: Internal Medicine

## 2014-08-07 ENCOUNTER — Encounter: Payer: Self-pay | Admitting: Internal Medicine

## 2014-08-07 ENCOUNTER — Ambulatory Visit (INDEPENDENT_AMBULATORY_CARE_PROVIDER_SITE_OTHER): Payer: 59 | Admitting: Internal Medicine

## 2014-08-07 VITALS — BP 128/70 | HR 66 | Temp 97.8°F | Wt 191.0 lb

## 2014-08-07 DIAGNOSIS — I10 Essential (primary) hypertension: Secondary | ICD-10-CM | POA: Diagnosis not present

## 2014-08-07 DIAGNOSIS — K047 Periapical abscess without sinus: Secondary | ICD-10-CM | POA: Insufficient documentation

## 2014-08-07 MED ORDER — AMOXICILLIN 500 MG PO TABS: 1000.0000 mg | ORAL_TABLET | Freq: Two times a day (BID) | ORAL | Status: DC

## 2014-08-07 NOTE — Telephone Encounter (Signed)
Patient Name: Tami Jones  DOB: [birth year]    Initial Comment caller states she has sore throat, headache, shaking and neck pain   Nurse Assessment  Nurse: Sherilyn Cooter, RN, Thurmond Butts Date/Time Lamount Cohen Time): 08/07/2014 8:08:30 AM  Confirm and document reason for call. If symptomatic, describe symptoms. ---Caller states that she thinks she has infection in her mouth due to 2 teeth that broke off on the left side. She denies swelling of her face. She has a sore throat, neck pain, headache and real jittery, but not sure if she has a fever. She has had the "jitters" all night. Her neck pain and ear pain is on the left side also. She states that her throat hurts worse than her headache. She rates her pain as 7-8 on 0-10 scale.  Has the patient traveled out of the country within the last 30 days? ---Not Applicable  Does the patient require triage? ---Yes  Related visit to physician within the last 2 weeks? ---No  Does the PT have any chronic conditions? (i.e. diabetes, asthma, etc.) ---Yes  List chronic conditions. ---HTN, Borderline Diabetes     Guidelines    Guideline Title Affirmed Question Affirmed Notes  Sore Throat SEVERE (e.g., excruciating) throat pain    Final Disposition User   See Physician within 24 Hours Sherilyn Cooter, RN, [employer] states that she already has an appointment to be seen today at 12:30pm.

## 2014-08-07 NOTE — Assessment & Plan Note (Signed)
BP Readings from Last 3 Encounters:  08/07/14 128/70  08/04/14 129/78  07/31/14 120/80   Has been good lately Will proceed with renal artery ultrasound and keep cardiology eval

## 2014-08-07 NOTE — Progress Notes (Signed)
Pre visit review using our clinic review tool, if applicable. No additional management support is needed unless otherwise documented below in the visit note. 

## 2014-08-07 NOTE — Telephone Encounter (Signed)
Pt has ED f/u with Dr Alphonsus Sias on 08/07/14 at 12:30 pm.

## 2014-08-07 NOTE — Progress Notes (Signed)
Subjective:    Patient ID: Tami Jones, female    DOB: 10-21-1955, 59 y.o.   MRN: [id number]  HPI Has had multiple ER visits One was due to left occipital and facial pain--she now thinks this is due to dental problems Some nausea once as well  Has some cracked lower left teeth--gets pain there and up into head and down neck BP has been better  Has had to cancel with Dr Luella Cook Finally going tomorrow  Current Outpatient Prescriptions on File Prior to Visit  Medication Sig Dispense Refill  . aspirin 81 MG tablet Take 81 mg by mouth daily.      Marland Kitchen atorvastatin (LIPITOR) 20 MG tablet Take 1 tablet (20 mg total) by mouth daily. 90 tablet 3  . cyclobenzaprine (FLEXERIL) 10 MG tablet Take 1 tablet (10 mg total) by mouth 3 (three) times daily as needed for muscle spasms. 15 tablet 1  . metoprolol succinate (TOPROL-XL) 100 MG 24 hr tablet Take 1 tablet (100 mg total) by mouth daily. Take with or immediately following a meal. 90 tablet 3  . ondansetron (ZOFRAN) 4 MG tablet Take 1 tablet (4 mg total) by mouth daily as needed for nausea or vomiting. 10 tablet 1   No current facility-administered medications on file prior to visit.    Allergies  Allergen Reactions  . Citalopram Other (See Comments)    intolerant  . Doxycycline Hyclate Other (See Comments)    REACTION: stomach upset  . Montelukast Sodium Other (See Comments)    REACTION: unspecified  . Nitrofurantoin Other (See Comments)    REACTION: unspecified  . Sulfa Antibiotics Nausea Only  . Tramadol Hcl Other (See Comments)    REACTION: Can not tolerate  . Venlafaxine Hcl Other (See Comments)    Intolerant  . Amoxicillin-Pot Clavulanate Nausea Only    Past Medical History  Diagnosis Date  . Diabetes mellitus   . GERD (gastroesophageal reflux disease)   . Hyperlipidemia   . CVA (cerebral vascular accident) 03/2006    right occipital, Dr. Thad Ranger  . Hypertension     Past Surgical History  Procedure Laterality Date    . Cesarean section    . Tonsillectomy and adenoidectomy    . Esophagogastroduodenoscopy  05/2005  . Cardiovascular stress test  1/15    myoview. EF 60%    Family History  Problem Relation Age of Onset  . Coronary artery disease Mother   . Diabetes Mellitus II Mother   . Diabetes Mellitus II Maternal Grandmother     History   Social History  . Marital Status: Divorced    Spouse Name: N/A  . Number of Children: 2  . Years of Education: N/A   Occupational History  . LAB [employer]    Social History Main Topics  . Smoking status: Never Smoker   . Smokeless tobacco: Never Used  . Alcohol Use: No     Comment: wine occassionally  . Drug Use: No  . Sexual Activity: Not on file   Other Topics Concern  . Not on file   Social History Narrative   Review of Systems  Able to eat Loose stool this am with upset stomach     Objective:   Physical Exam  HENT:  3 lower molars broken with gingival swelling and tenderness on left  Neck: Normal range of motion. Neck supple. No thyromegaly present.  Lymphadenopathy:    She has no cervical adenopathy.          Assessment &  Plan:

## 2014-08-07 NOTE — Telephone Encounter (Signed)
Will evaluate this as well at today's visit

## 2014-08-07 NOTE — Assessment & Plan Note (Signed)
??  could this be the cause of her neck pain and accelerated HTN? Will treat with amoxil and give refills She is looking into an oral surgeon--will need these teeth extracted

## 2014-08-08 ENCOUNTER — Emergency Department
Admission: EM | Admit: 2014-08-08 | Discharge: 2014-08-08 | Disposition: A | Discharge status: Home / Self Care | Payer: 59

## 2014-08-08 ENCOUNTER — Telehealth: Payer: Self-pay

## 2014-08-08 MED ORDER — ALPRAZOLAM 0.25 MG PO TABS: 0.2500 mg | ORAL_TABLET | Freq: Two times a day (BID) | ORAL | Status: DC | PRN

## 2014-08-08 NOTE — Telephone Encounter (Signed)
Okay to send Rx for alprazolam 0.25 mg #20 x 0 1 bid prn for nerves  She can try this the next time she has an attack like that

## 2014-08-08 NOTE — Telephone Encounter (Signed)
rx called into pharmacy Spoke with patient and advised results   

## 2014-08-08 NOTE — Telephone Encounter (Signed)
pt said last night she woke up and her BP went up (BP was 180/110) and pt was shaky; pt thinks having panic attacks; pt went to [facility] ED and had BP taken, pt was told would have to wait 3 hours and triage nurse at [facility] advised pt needs to get med for anxiety.When pt left ED BP was 120/76. Now pt feels OK. [facility] [address]. Pt request cb.

## 2014-08-09 ENCOUNTER — Telehealth: Payer: Self-pay | Admitting: Internal Medicine

## 2014-08-09 DIAGNOSIS — I10 Essential (primary) hypertension: Secondary | ICD-10-CM | POA: Diagnosis not present

## 2014-08-09 NOTE — Telephone Encounter (Signed)
Please prepare the note

## 2014-08-09 NOTE — Telephone Encounter (Signed)
Pt needs a note that states that she was out on June 14th and 15th, and that it is ok to return to work on the 16th. She also needs a note that states she was out on the 20th through the 23rd and that it is ok to return to work on the 24th. She needs to pick up this note today and give it to the disability people at her work. Call [phone] when ready to pick up Thanks.

## 2014-08-09 NOTE — Telephone Encounter (Signed)
Spoke with patient and advised letter ready.

## 2014-08-12 ENCOUNTER — Telehealth: Payer: Self-pay | Admitting: Internal Medicine

## 2014-08-12 ENCOUNTER — Ambulatory Visit (INDEPENDENT_AMBULATORY_CARE_PROVIDER_SITE_OTHER): Payer: 59

## 2014-08-12 DIAGNOSIS — I10 Essential (primary) hypertension: Secondary | ICD-10-CM | POA: Diagnosis not present

## 2014-08-12 NOTE — Telephone Encounter (Signed)
Patient called to let you know Tami Jones will be faxing disability paperwork today.  Tami dates are 6/14,6/15,6/17-23.  If you have any questions about Tami paperwork,please call patient.  Tami paperwork is due by 08/20/14.

## 2014-08-13 DIAGNOSIS — Z7689 Persons encountering health services in other specified circumstances: Secondary | ICD-10-CM

## 2014-08-13 NOTE — Telephone Encounter (Signed)
Form done 

## 2014-08-13 NOTE — Telephone Encounter (Signed)
Tried calling pt different name on voice mail  Voice mail also full

## 2014-08-13 NOTE — Telephone Encounter (Signed)
I still don't see this paperwork

## 2014-08-13 NOTE — Telephone Encounter (Signed)
Zella BallRobin has the paperwork and [facility] when she's done.

## 2014-08-13 NOTE — Telephone Encounter (Signed)
In Dr Vassie MoselleLetvaks In Box

## 2014-08-13 NOTE — Telephone Encounter (Signed)
Faxed paperwork to [phone] Copy for pt  Copy for file Copy to scan Copy to billing

## 2014-08-14 ENCOUNTER — Encounter: Payer: Self-pay | Admitting: *Deleted

## 2014-08-14 ENCOUNTER — Telehealth: Payer: Self-pay | Admitting: Internal Medicine

## 2014-08-14 NOTE — Telephone Encounter (Signed)
Patient aware paperwork faxed

## 2014-08-14 NOTE — Telephone Encounter (Signed)
Pt was seen on 07/22/14 and said she was going back to work the next day, per the OV. Please advise

## 2014-08-14 NOTE — Telephone Encounter (Signed)
Pt's mailbox is full and I could not leave a message. Will try again later.

## 2014-08-14 NOTE — Telephone Encounter (Signed)
She did go back briefly I think Confirm with her what she needs---you can check the FMLA to see the dates I wrote her out

## 2014-08-14 NOTE — Telephone Encounter (Signed)
Pt needs not to return to work dated 07/23/14 Please advise when ready for her to pick

## 2014-08-15 ENCOUNTER — Encounter: Payer: Self-pay | Admitting: Cardiovascular Disease

## 2014-08-15 ENCOUNTER — Ambulatory Visit (INDEPENDENT_AMBULATORY_CARE_PROVIDER_SITE_OTHER): Payer: 59 | Admitting: Cardiovascular Disease

## 2014-08-15 VITALS — BP 160/90 | HR 60 | Ht 64.0 in | Wt 201.0 lb

## 2014-08-15 DIAGNOSIS — R079 Chest pain, unspecified: Secondary | ICD-10-CM | POA: Diagnosis not present

## 2014-08-15 DIAGNOSIS — R0602 Shortness of breath: Secondary | ICD-10-CM | POA: Diagnosis not present

## 2014-08-15 MED ORDER — HYDROCHLOROTHIAZIDE 25 MG PO TABS: 25.0000 mg | ORAL_TABLET | Freq: Every day | ORAL | Status: DC

## 2014-08-15 MED ORDER — POTASSIUM CHLORIDE CRYS ER 20 MEQ PO TBCR: 20.0000 meq | EXTENDED_RELEASE_TABLET | Freq: Every day | ORAL | Status: DC

## 2014-08-15 NOTE — Patient Instructions (Signed)
Medication Instructions: - Start Hydrochlorothiazide (HCTZ) 25 mg one tablet by mouth once daily - Start K-Dur (potassium) 20 meq one tablet by mouth once daily  Labwork: - Your physician recommends that you return for lab work in: 3-4 weeks- BMP  Procedures/Testing: - none  Follow-Up: - Your physician recommends that you schedule a follow-up appointment in: 3 months with Dr. Elease HashimotoNahser  Any Additional Special Instructions Will Be Listed Below (If Applicable).

## 2014-08-15 NOTE — Progress Notes (Signed)
Cardiology Office Note   Date:  08/15/2014   ID:  Tami ArbourCynthia B Jones, DOB 06/08/1955, MRN [id number]  PCP:  Tillman Abideichard Letvak, MD  Cardiologist:   Elease HashimotoNahser, Deloris PingPhilip J, MD   Chief Complaint  Patient presents with  . other    Ref by Dr. Alphonsus SiasLetvak for HTN and chest pain. Meds reviewed by the patient verbally.    Problem list 1. Hypertension 2. Chest discomfort 3. Diabetes Mellitus ( better with weight loss)  4. Hx of CVA 5. Hyperlipidemia    History of Present Illness: Tami Jones is a 59 y.o. female who presents for  Evaluation of hypertension and some episodes of chest discomfort. Had a stress myoview in January , 2015 - was told it was normal.   CP - typically when her BP goes up. Seems to be positional .  Has palpitations .  Goes to the gym  ( up until 4 weeks ago , due to HTN)  Has been to the ER several times recently .   does not have CP unless her BP is elevated.   Works in a Environmental health practitionerlaboratory Eats bagel for breakfast or fruit / yogart      Past Medical History  Diagnosis Date  . Diabetes mellitus   . GERD (gastroesophageal reflux disease)   . Hyperlipidemia   . CVA (cerebral vascular accident) 03/2006    right occipital, Dr. Thad Rangereynolds  . Hypertension     Past Surgical History  Procedure Laterality Date  . Cesarean section    . Tonsillectomy and adenoidectomy    . Esophagogastroduodenoscopy  05/2005  . Cardiovascular stress test  1/15    myoview. EF 60%     Current Outpatient Prescriptions  Medication Sig Dispense Refill  . ALPRAZolam (XANAX) 0.25 MG tablet Take 1 tablet (0.25 mg total) by mouth 2 (two) times daily as needed for anxiety. 20 tablet 0  . amoxicillin (AMOXIL) 500 MG tablet Take 2 tablets (1,000 mg total) by mouth 2 (two) times daily. 40 tablet 2  . aspirin 81 MG tablet Take 81 mg by mouth daily.      Marland Kitchen. atorvastatin (LIPITOR) 20 MG tablet Take 1 tablet (20 mg total) by mouth daily. 90 tablet 3  . cyclobenzaprine (FLEXERIL) 10 MG tablet Take 1 tablet  (10 mg total) by mouth 3 (three) times daily as needed for muscle spasms. 15 tablet 1  . lidocaine (LIDODERM) 5 % Place 1 patch onto the skin daily. Remove & Discard patch within 12 hours or as directed by MD    . lisinopril (PRINIVIL,ZESTRIL) 20 MG tablet Take 40 mg by mouth daily.    . metoprolol succinate (TOPROL-XL) 100 MG 24 hr tablet Take 1 tablet (100 mg total) by mouth daily. Take with or immediately following a meal. 90 tablet 3  . ondansetron (ZOFRAN) 4 MG tablet Take 1 tablet (4 mg total) by mouth daily as needed for nausea or vomiting. 10 tablet 1   No current facility-administered medications for this visit.    Allergies:   Citalopram; Doxycycline hyclate; Montelukast sodium; Nitrofurantoin; Sulfa antibiotics; Tramadol hcl; Venlafaxine hcl; and Amoxicillin-pot clavulanate    Social History:  The patient  reports that she has never smoked. She has never used smokeless tobacco. She reports that she does not drink alcohol or use illicit drugs.   Family History:  The patient's family history includes Coronary artery disease in her mother; Diabetes Mellitus II in her maternal grandmother and mother; Heart attack (age of onset: 6363) in  her mother.    ROS:  Please see the history of present illness.    Review of Systems: Constitutional:  denies fever, chills, diaphoresis, appetite change and fatigue.  HEENT: denies photophobia, eye pain, redness, hearing loss, ear pain, congestion, sore throat, rhinorrhea, sneezing, neck pain, neck stiffness and tinnitus.  Respiratory: denies SOB, DOE, cough, chest tightness, and wheezing.  Cardiovascular: denies chest pain, palpitations and leg swelling.  Gastrointestinal: denies nausea, vomiting, abdominal pain, diarrhea, constipation, blood in stool.  Genitourinary: denies dysuria, urgency, frequency, hematuria, flank pain and difficulty urinating.  Musculoskeletal: denies  myalgias, back pain, joint swelling, arthralgias and gait problem.   Skin:  denies pallor, rash and wound.  Neurological: denies dizziness, seizures, syncope, weakness, light-headedness, numbness and headaches.   Hematological: denies adenopathy, easy bruising, personal or family bleeding history.  Psychiatric/ Behavioral: denies suicidal ideation, mood changes, confusion, nervousness, sleep disturbance and agitation.       All other systems are reviewed and negative.    PHYSICAL EXAM: VS:  BP 160/90 mmHg  Pulse 60  Ht 5\' 4"  (1.626 m)  Wt 91.173 kg (201 lb)  BMI 34.48 kg/m2 , BMI Body mass index is 34.48 kg/(m^2). GEN: Well nourished, well developed, in no acute distress HEENT: normal Neck: no JVD, carotid bruits, or masses Cardiac: RRR; no murmurs, rubs, or gallops,no edema  Respiratory:  clear to auscultation bilaterally, normal work of breathing GI: soft, nontender, nondistended, + BS MS: no deformity or atrophy Skin: warm and dry, no rash Neuro:  Strength and sensation are intact Psych: normal   EKG:  EKG is ordered today. The ekg ordered today demonstrates NSR at 60.  Normal ECG    Recent Labs: 07/10/2014: TSH 6.648* 08/04/2014: ALT 22; BUN 19; Creatinine, Ser 1.05*; Hemoglobin 14.5; Platelets 180; Potassium 3.6; Sodium 138    Lipid Panel    Component Value Date/Time   CHOL 150 07/11/2014 0400   CHOL 231* 02/28/2014 1656   TRIG 85 07/11/2014 0400   HDL 38* 07/11/2014 0400   HDL 54 02/28/2014 1656   CHOLHDL 3.9 07/11/2014 0400   CHOLHDL 4.3 02/28/2014 1656   VLDL 17 07/11/2014 0400   LDLCALC 95 07/11/2014 0400   LDLCALC 161* 02/28/2014 1656      Wt Readings from Last 3 Encounters:  08/15/14 91.173 kg (201 lb)  08/07/14 86.637 kg (191 lb)  08/04/14 85.73 kg (189 lb)      Other studies Reviewed: Additional studies/ records that were reviewed today include: . Review of the above records demonstrates:    ASSESSMENT AND PLAN:  1. Hypertension - she presents with moderate HTN today .  She has previously been on several other  medications.  I have found that she has been on low dose HCTZ, hydralazine, amlodipine.    She does not know when these meds were stopped.  She does not know what these were stopped.  There was concern about renal artery stenosis - a renal artery duplex was done but I do not have the results at this point  I think one issue is she has multiple doctors attempting to manage the same problem.  In reviewing her chart,  Some of her BP readings are in the "OK " range.  She has a poor understanding of her medications ( possibly related to her CVA)  I think this may be impairing our ability  to offer quality and consistent care.  i will add HCTZ 25 mg a day , Kdur 20 meq a day.  Will check a BMP in 3 weeks. Will see her back in 3 months.  I anticipate getting her back with her medical doctor soon.    2. Chest discomfort - she has had a negative myoview in the past.  Her symptoms do not sound like angina.  Only has CP when her BP is up. I do not think she needs any additional work up at this point.  If she has additional episodes of CP once her BP is controlled, then we can consider a myoview    3. Diabetes Mellitus ( better with weight loss)  4. Hx of CVA 5. Hyperlipidemia - managed by her primary medical doctor   Current medicines are reviewed at length with the patient today.  The patient does not have concerns regarding medicines.  The following changes have been made:  no change  Labs/ tests ordered today include:  Orders Placed This Encounter  Procedures  . EKG 12-Lead     Disposition:   FU with me in 3 months       Neilani Duffee, Deloris Ping, MD  08/15/2014 12:00 PM    [facility] [address] [place], [place], [place]  [address] Phone: [phone]; Fax: [phone]   [facility]  [address] [place], [place]  [address] [phone]    Fax [phone]

## 2014-08-15 NOTE — Telephone Encounter (Signed)
Spoke with patient and advised results Letter ready and pt will pick up

## 2014-08-21 ENCOUNTER — Telehealth: Payer: Self-pay | Admitting: Internal Medicine

## 2014-08-21 ENCOUNTER — Ambulatory Visit: Payer: 59 | Admitting: Internal Medicine

## 2014-08-21 NOTE — Telephone Encounter (Signed)
Spoke with pt    i let her know i received more paperwork from [employer] [employer] and wanted to know dates. Pt stated she would have to call [facility] to see what they need and would call be back

## 2014-08-22 NOTE — Telephone Encounter (Signed)
Spoke with pt.  Pt stated she needed to call [facility] to see what they needed

## 2014-08-26 NOTE — Telephone Encounter (Signed)
Tried called pt  Mail box full

## 2014-08-28 NOTE — Telephone Encounter (Signed)
Spoke with Tami Jones she stated she didn't need this paperwork that she has intermittent fmla She is aware i will shredding paperwork

## 2014-09-03 ENCOUNTER — Telehealth: Payer: Self-pay | Admitting: Internal Medicine

## 2014-09-03 ENCOUNTER — Ambulatory Visit: Payer: 59 | Admitting: Internal Medicine

## 2014-09-03 NOTE — Telephone Encounter (Signed)
Called to r/s appt, mailbox full mf

## 2014-09-03 NOTE — Telephone Encounter (Signed)
Patient did not come in for their appointment today for 1 month follow up  Please let me know if patient needs to be contacted immediately for follow up or no follow up needed. °

## 2014-09-03 NOTE — Telephone Encounter (Signed)
Hopefully she is doing better Please have no charge for the no show Have her set up a follow up in 2-3 months if her BP has been okay now

## 2014-09-10 NOTE — Telephone Encounter (Signed)
Called to schedule follow up, mailbox full

## 2014-09-10 NOTE — Telephone Encounter (Signed)
Scheduled for 2 mos out, pt aware, pr states bp has been doing well.

## 2014-09-16 ENCOUNTER — Other Ambulatory Visit: Payer: 59

## 2014-10-10 ENCOUNTER — Ambulatory Visit: Payer: 59 | Admitting: Nurse Practitioner

## 2014-10-10 DIAGNOSIS — Z0289 Encounter for other administrative examinations: Secondary | ICD-10-CM

## 2014-10-18 ENCOUNTER — Telehealth: Payer: Self-pay | Admitting: Internal Medicine

## 2014-10-18 NOTE — Telephone Encounter (Signed)
[facility] faxed fmla paperwork Spoke to pt  She stated she was out of 10/17/14 and returned to work In Dr Karle Starch IN BOX

## 2014-10-22 NOTE — Telephone Encounter (Signed)
Form finished and signed.  

## 2014-10-22 NOTE — Telephone Encounter (Signed)
Tried call pt   Mailbox full

## 2014-10-23 DIAGNOSIS — Z0279 Encounter for issue of other medical certificate: Secondary | ICD-10-CM

## 2014-10-23 NOTE — Telephone Encounter (Signed)
Pt aware paperwork faxed to [facility]  [phone] Copy for pt Copy for scan Copy for billing Copy for file

## 2014-10-25 ENCOUNTER — Ambulatory Visit (INDEPENDENT_AMBULATORY_CARE_PROVIDER_SITE_OTHER): Payer: 59 | Admitting: Internal Medicine

## 2014-10-25 ENCOUNTER — Other Ambulatory Visit: Payer: Self-pay | Admitting: Internal Medicine

## 2014-10-25 ENCOUNTER — Encounter: Payer: Self-pay | Admitting: Internal Medicine

## 2014-10-25 VITALS — BP 140/80 | HR 66 | Temp 98.2°F | Wt 208.0 lb

## 2014-10-25 DIAGNOSIS — E119 Type 2 diabetes mellitus without complications: Secondary | ICD-10-CM | POA: Diagnosis not present

## 2014-10-25 DIAGNOSIS — I10 Essential (primary) hypertension: Secondary | ICD-10-CM

## 2014-10-25 DIAGNOSIS — F39 Unspecified mood [affective] disorder: Secondary | ICD-10-CM | POA: Diagnosis not present

## 2014-10-25 DIAGNOSIS — K047 Periapical abscess without sinus: Secondary | ICD-10-CM | POA: Diagnosis not present

## 2014-10-25 MED ORDER — AMOXICILLIN 500 MG PO TABS: 1000.0000 mg | ORAL_TABLET | Freq: Two times a day (BID) | ORAL | Status: DC

## 2014-10-25 MED ORDER — ALPRAZOLAM 0.25 MG PO TABS: 0.2500 mg | ORAL_TABLET | Freq: Two times a day (BID) | ORAL | Status: DC | PRN

## 2014-10-25 NOTE — Assessment & Plan Note (Signed)
Lab Results  Component Value Date   HGBA1C 6.1* 07/11/2014   Good control Labs next time

## 2014-10-25 NOTE — Progress Notes (Signed)
Pre visit review using our clinic review tool, if applicable. No additional management support is needed unless otherwise documented below in the visit note. 

## 2014-10-25 NOTE — Addendum Note (Signed)
Addended by: Sueanne Margarita on: 10/25/2014 10:20 AM   Modules accepted: Orders

## 2014-10-25 NOTE — Assessment & Plan Note (Signed)
Anxiety with occasional panic Chronic depression Will refill the alprazolam

## 2014-10-25 NOTE — Assessment & Plan Note (Signed)
BP Readings from Last 3 Encounters:  10/25/14 140/80  08/15/14 160/90  08/07/14 128/70   reasonable control No changes needed

## 2014-10-25 NOTE — Progress Notes (Signed)
Subjective:    Patient ID: Tami Jones, female    DOB: 1955-05-01, 59 y.o.   MRN: [id number]  HPI Here for follow up of HTN and other health issues  Having some sinus symptoms Did see dentist-- Dr Montez Morita. Had clinda for a while. Will need extractions but wants anaesthesia Needs to be scheduled Now having neck pain--some swollen glands ?low grade fever Some chills No cough  Checks sugars occasionally Stay low No trouble with feet Frustrated with weight gain---goes to gym and tries to eat right  Had slight chest pain--under clavicle. While at work--?stress No problems when doing aerobic work on treadmill Breathing is okay usually---will have sense of "panting" sometimes at rest  Still has some panic type symptoms Not as bad Alprazolam helped but ran out  Current Outpatient Prescriptions on File Prior to Visit  Medication Sig Dispense Refill  . ALPRAZolam (XANAX) 0.25 MG tablet Take 1 tablet (0.25 mg total) by mouth 2 (two) times daily as needed for anxiety. 20 tablet 0  . aspirin 81 MG tablet Take 81 mg by mouth daily.      Marland Kitchen atorvastatin (LIPITOR) 20 MG tablet Take 1 tablet (20 mg total) by mouth daily. 90 tablet 3  . cyclobenzaprine (FLEXERIL) 10 MG tablet Take 1 tablet (10 mg total) by mouth 3 (three) times daily as needed for muscle spasms. 15 tablet 1  . hydrochlorothiazide (HYDRODIURIL) 25 MG tablet Take 1 tablet (25 mg total) by mouth daily. 90 tablet 3  . lidocaine (LIDODERM) 5 % Place 1 patch onto the skin daily. Remove & Discard patch within 12 hours or as directed by MD    . lisinopril (PRINIVIL,ZESTRIL) 20 MG tablet Take 40 mg by mouth daily.    . metoprolol succinate (TOPROL-XL) 100 MG 24 hr tablet Take 1 tablet (100 mg total) by mouth daily. Take with or immediately following a meal. 90 tablet 3  . ondansetron (ZOFRAN) 4 MG tablet Take 1 tablet (4 mg total) by mouth daily as needed for nausea or vomiting. 10 tablet 1  . potassium chloride SA (K-DUR,KLOR-CON) 20  MEQ tablet Take 1 tablet (20 mEq total) by mouth daily. 90 tablet 3   No current facility-administered medications on file prior to visit.    Allergies  Allergen Reactions  . Citalopram Other (See Comments)    intolerant  . Doxycycline Hyclate Other (See Comments)    REACTION: stomach upset  . Montelukast Sodium Other (See Comments)    REACTION: unspecified  . Nitrofurantoin Other (See Comments)    REACTION: unspecified  . Sulfa Antibiotics Nausea Only  . Tramadol Hcl Other (See Comments)    REACTION: Can not tolerate  . Venlafaxine Hcl Other (See Comments)    Intolerant  . Amoxicillin-Pot Clavulanate Nausea Only    Past Medical History  Diagnosis Date  . Diabetes mellitus   . GERD (gastroesophageal reflux disease)   . Hyperlipidemia   . CVA (cerebral vascular accident) 03/2006    right occipital, Dr. Thad Ranger  . Hypertension     Past Surgical History  Procedure Laterality Date  . Cesarean section    . Tonsillectomy and adenoidectomy    . Esophagogastroduodenoscopy  05/2005  . Cardiovascular stress test  1/15    myoview. EF 60%    Family History  Problem Relation Age of Onset  . Coronary artery disease Mother   . Diabetes Mellitus II Mother   . Heart attack Mother 69  . Diabetes Mellitus II Maternal Grandmother  Social History   Social History  . Marital Status: Divorced    Spouse Name: N/A  . Number of Children: 2  . Years of Education: N/A   Occupational History  . LAB [employer]    Social History Main Topics  . Smoking status: Never Smoker   . Smokeless tobacco: Never Used  . Alcohol Use: No     Comment: wine occassionally  . Drug Use: No  . Sexual Activity: Not on file   Other Topics Concern  . Not on file   Social History Narrative   Review of Systems  Still not sleeping well Bowels okay Weight up a little    Objective:   Physical Exam  Constitutional: She appears well-developed and well-nourished. No distress.  HENT:  Multiple  broken teeth with tender gingiva  Neck: Normal range of motion. Neck supple. No thyromegaly present.  Cardiovascular: Normal rate, regular rhythm, normal heart sounds and intact distal pulses.  Exam reveals no gallop.   No murmur heard. Pulmonary/Chest: Effort normal and breath sounds normal. No respiratory distress. She has no wheezes. She has no rales.  Musculoskeletal: She exhibits no edema or tenderness.  Lymphadenopathy:    She has no cervical adenopathy.  Neurological:  Decreased sensation in right foot  Skin:  No foot lesions  Psychiatric:  Mild depressed mood Slight psychomotor retardation          Assessment & Plan:

## 2014-10-25 NOTE — Assessment & Plan Note (Signed)
Will give amoxil again Needs the extractions but needs money first

## 2014-10-31 ENCOUNTER — Telehealth: Payer: Self-pay

## 2014-10-31 ENCOUNTER — Encounter: Payer: Self-pay | Admitting: Internal Medicine

## 2014-10-31 ENCOUNTER — Ambulatory Visit (INDEPENDENT_AMBULATORY_CARE_PROVIDER_SITE_OTHER): Payer: 59 | Admitting: Internal Medicine

## 2014-10-31 VITALS — BP 148/86 | HR 87 | Temp 98.2°F | Wt 209.0 lb

## 2014-10-31 DIAGNOSIS — R142 Eructation: Secondary | ICD-10-CM

## 2014-10-31 DIAGNOSIS — J029 Acute pharyngitis, unspecified: Secondary | ICD-10-CM | POA: Diagnosis not present

## 2014-10-31 DIAGNOSIS — R110 Nausea: Secondary | ICD-10-CM

## 2014-10-31 DIAGNOSIS — G44209 Tension-type headache, unspecified, not intractable: Secondary | ICD-10-CM | POA: Diagnosis not present

## 2014-10-31 DIAGNOSIS — I10 Essential (primary) hypertension: Secondary | ICD-10-CM

## 2014-10-31 MED ORDER — ONDANSETRON HCL 4 MG/2ML IJ SOLN
4.0000 mg | Freq: Once | INTRAMUSCULAR | Status: AC
  Administered 2014-10-31: 4 mg via INTRAMUSCULAR

## 2014-10-31 MED ORDER — KETOROLAC TROMETHAMINE 30 MG/ML IJ SOLN
30.0000 mg | Freq: Once | INTRAMUSCULAR | Status: AC
  Administered 2014-10-31: 30 mg via INTRAMUSCULAR

## 2014-10-31 NOTE — Telephone Encounter (Signed)
Pt here in office now to see [facility]

## 2014-10-31 NOTE — Patient Instructions (Signed)

## 2014-10-31 NOTE — Telephone Encounter (Signed)
Oh good--that sounds more appropriate

## 2014-10-31 NOTE — Addendum Note (Signed)
Addended by: Roena Malady on: 10/31/2014 02:49 PM   Modules accepted: Orders

## 2014-10-31 NOTE — Progress Notes (Signed)
Subjective:    Patient ID: Tami Jones, female    DOB: 1955/08/13, 59 y.o.   MRN: [id number]  HPI  Pt presents to the clinic today with c/o headache and nausea. This started last night. The headache is located in the back of her head. She reports that it radiates forward. She describes it as a tightness/aching. She denies blurred vision, dizziness, sensitivity to light and sound. She does have some associated nausea and abdominal cramping. She feels like her throat is sore and she keeps belching. She has also noticed some chest tightness but denies shortness of breath. She denies constipation, diarrhea or blood in her stool. She has not tried anything OTC. She was started on Hydrocodone for dental pain last week. She was also started on an antibiotic 2 weeks ago because of her poor dentition and she is not able to afford her teeth extractions. She has taken both of these medications previously. She denies sick contacts.  Of note, her BP is elevated today at 160/92. She feels like this is due to lack of sleep last night and whatever is causing her current symptoms. She denies chest pain or shortness of breath. She has taken her Lisinopril and Metoprolol as prescribed.  Review of Systems      Past Medical History  Diagnosis Date  . Diabetes mellitus   . GERD (gastroesophageal reflux disease)   . Hyperlipidemia   . CVA (cerebral vascular accident) 03/2006    right occipital, Dr. Thad Ranger  . Hypertension     Current Outpatient Prescriptions  Medication Sig Dispense Refill  . ALPRAZolam (XANAX) 0.25 MG tablet Take 1 tablet (0.25 mg total) by mouth 2 (two) times daily as needed for anxiety. 30 tablet 0  . amoxicillin (AMOXIL) 500 MG tablet Take 2 tablets (1,000 mg total) by mouth 2 (two) times daily. 40 tablet 2  . aspirin 81 MG tablet Take 81 mg by mouth daily.      Marland Kitchen atorvastatin (LIPITOR) 20 MG tablet Take 1 tablet (20 mg total) by mouth daily. 90 tablet 3  . cyclobenzaprine (FLEXERIL)  10 MG tablet Take 1 tablet (10 mg total) by mouth 3 (three) times daily as needed for muscle spasms. 15 tablet 1  . hydrochlorothiazide (HYDRODIURIL) 25 MG tablet Take 1 tablet (25 mg total) by mouth daily. 90 tablet 3  . lidocaine (LIDODERM) 5 % Place 1 patch onto the skin daily. Remove & Discard patch within 12 hours or as directed by MD    . lisinopril (PRINIVIL,ZESTRIL) 20 MG tablet Take 40 mg by mouth daily.    . metoprolol succinate (TOPROL-XL) 100 MG 24 hr tablet Take 1 tablet (100 mg total) by mouth daily. Take with or immediately following a meal. 90 tablet 3  . ondansetron (ZOFRAN) 4 MG tablet Take 1 tablet (4 mg total) by mouth daily as needed for nausea or vomiting. 10 tablet 1  . potassium chloride SA (K-DUR,KLOR-CON) 20 MEQ tablet Take 1 tablet (20 mEq total) by mouth daily. 90 tablet 3   No current facility-administered medications for this visit.    Allergies  Allergen Reactions  . Citalopram Other (See Comments)    intolerant  . Doxycycline Hyclate Other (See Comments)    REACTION: stomach upset  . Montelukast Sodium Other (See Comments)    REACTION: unspecified  . Nitrofurantoin Other (See Comments)    REACTION: unspecified  . Sulfa Antibiotics Nausea Only  . Tramadol Hcl Other (See Comments)    REACTION: Can not  tolerate  . Venlafaxine Hcl Other (See Comments)    Intolerant  . Amoxicillin-Pot Clavulanate Nausea Only    Family History  Problem Relation Age of Onset  . Coronary artery disease Mother   . Diabetes Mellitus II Mother   . Heart attack Mother 95  . Diabetes Mellitus II Maternal Grandmother     Social History   Social History  . Marital Status: Divorced    Spouse Name: N/A  . Number of Children: 2  . Years of Education: N/A   Occupational History  . LAB [employer]    Social History Main Topics  . Smoking status: Never Smoker   . Smokeless tobacco: Never Used  . Alcohol Use: No     Comment: wine occassionally  . Drug Use: No  . Sexual  Activity: Not on file   Other Topics Concern  . Not on file   Social History Narrative     Constitutional: Pt reports fatigue and headache. Denies fever, malaise, or abrupt weight changes.  HEENT: Denies eye pain, eye redness, ear pain, ringing in the ears, wax buildup, runny nose, nasal congestion, bloody nose, or sore throat. Respiratory: Denies difficulty breathing, shortness of breath, cough or sputum production.   Cardiovascular: Pt reports chest tightness. Denies chest pain, palpitations or swelling in the hands or feet.  Gastrointestinal: Pt reports nausea. Denies abdominal pain, bloating, constipation, diarrhea or blood in the stool.  GU: Denies urgency, frequency, pain with urination, burning sensation, blood in urine, odor or discharge. Skin: Denies redness, rashes, lesions or ulcercations.  Neurological: Denies dizziness, difficulty with memory, difficulty with speech or problems with balance and coordination.    No other specific complaints in a complete review of systems (except as listed in HPI above).  Objective:   Physical Exam  BP 160/94 mmHg  Pulse 87  Temp(Src) 98.2 F (36.8 C) (Oral)  Wt 209 lb (94.802 kg)  SpO2 98% Wt Readings from Last 3 Encounters:  10/31/14 209 lb (94.802 kg)  10/25/14 208 lb (94.348 kg)  08/15/14 201 lb (91.173 kg)    General: Appears her stated age, obese in NAD. Skin: Warm, dry and intact. No rashes, lesions or ulcerations noted. HEENT: Head: normal shape and size; Eyes: sclera white, no icterus, conjunctiva pink, PERRLA and EOMs intact; Ears: Tm's gray and intact, normal light reflex; Throat/Mouth: Teeth present, mucosa pink and dry, no exudate, lesions or ulcerations noted.  Neck:  No adenopathy noted. Cardiovascular: Normal rate and rhythm. S1,S2 noted.  No murmur, rubs or gallops noted. Pulmonary/Chest: Normal effort and positive vesicular breath sounds. No respiratory distress. No wheezes, rales or ronchi noted.  Abdomen: Soft  and nontender. Normal bowel sounds.  Neurological: Alert and oriented.    BMET    Component Value Date/Time   NA 138 08/04/2014 0557   NA 142 07/17/2014 1009   NA 140 03/04/2013 1849   K 3.6 08/04/2014 0557   K 4.1 03/04/2013 1849   CL 101 08/04/2014 0557   CL 105 03/04/2013 1849   CO2 31 08/04/2014 0557   CO2 30 03/04/2013 1849   GLUCOSE 147* 08/04/2014 0557   GLUCOSE 119* 07/17/2014 1009   GLUCOSE 87 03/04/2013 1849   BUN 19 08/04/2014 0557   BUN 13 07/17/2014 1009   BUN 18 03/04/2013 1849   CREATININE 1.05* 08/04/2014 0557   CREATININE 1.02 03/04/2013 1849   CALCIUM 9.1 08/04/2014 0557   CALCIUM 9.2 03/04/2013 1849   GFRNONAA 58* 08/04/2014 0557   GFRNONAA >60 03/04/2013  1849   GFRAA >60 08/04/2014 0557   GFRAA >60 03/04/2013 1849    Lipid Panel     Component Value Date/Time   CHOL 150 07/11/2014 0400   CHOL 231* 02/28/2014 1656   TRIG 85 07/11/2014 0400   HDL 38* 07/11/2014 0400   HDL 54 02/28/2014 1656   CHOLHDL 3.9 07/11/2014 0400   CHOLHDL 4.3 02/28/2014 1656   VLDL 17 07/11/2014 0400   LDLCALC 95 07/11/2014 0400   LDLCALC 161* 02/28/2014 1656    CBC    Component Value Date/Time   WBC 6.9 08/04/2014 0557   WBC 6.5 02/28/2014 1656   WBC 6.5 03/04/2013 1849   RBC 4.69 08/04/2014 0557   RBC 4.54 02/28/2014 1656   RBC 4.61 03/04/2013 1849   HGB 14.5 08/04/2014 0557   HGB 14.4 03/04/2013 1849   HCT 43.0 08/04/2014 0557   HCT 42.4 03/04/2013 1849   PLT 180 08/04/2014 0557   PLT 215 03/04/2013 1849   MCV 91.6 08/04/2014 0557   MCV 92 03/04/2013 1849   MCH 30.9 08/04/2014 0557   MCH 30.2 02/28/2014 1656   MCH 31.1 03/04/2013 1849   MCHC 33.7 08/04/2014 0557   MCHC 32.9 02/28/2014 1656   MCHC 33.9 03/04/2013 1849   RDW 13.0 08/04/2014 0557   RDW 13.5 02/28/2014 1656   RDW 12.5 03/04/2013 1849   LYMPHSABS 1.7 08/04/2014 0557   LYMPHSABS 2.5 02/28/2014 1656   MONOABS 0.3 08/04/2014 0557   EOSABS 0.1 08/04/2014 0557   EOSABS 0.1 02/28/2014 1656     BASOSABS 0.0 08/04/2014 0557   BASOSABS 0.0 02/28/2014 1656    Hgb A1C Lab Results  Component Value Date   HGBA1C 6.1* 07/11/2014         Assessment & Plan:   Headache:  This seems like a tension headache to me 30 mg Toradol IM today Tylenol every 8 hours as needed  Sore throat, belching, nausea:  This seems like gastritis to me Could be worse by antibiotic 4 mg Zofran IM today Start Prilosec OTC daily 30 min before breakfast (while on abx) Avoid NSAIDS Try to hold off on taking the Norco in case this is a reaction( but not typical symptoms)  HTN:  Continue current medications at this time Follow up with PCP in 2 weeks to repeat blood pressure RTC as needed or if symptoms persist or wrosen

## 2014-10-31 NOTE — Progress Notes (Signed)
Pre visit review using our clinic review tool, if applicable. No additional management support is needed unless otherwise documented below in the visit note. 

## 2014-10-31 NOTE — Telephone Encounter (Signed)
PLEASE NOTE: All timestamps contained within this report are represented as [place] Standard Time. CONFIDENTIALTY NOTICE: This fax transmission is intended only for the addressee. It contains information that is legally privileged, confidential or otherwise protected from use or disclosure. If you are not the intended recipient, you are strictly prohibited from reviewing, disclosing, copying using or disseminating any of this information or taking any action in reliance on or regarding this information. If you have received this fax in error, please notify us immediately by telephone so that we can arrange for its return to [place]. Phone: [phone], Toll-Free: [phone], Fax: [phone] Page: 1 of 2 Call Id: [id number] [facility] Night - Client TELEPHONE ADVICE RECORD [facility] Patient Name: Tami Jones Gender: Female DOB: [birth year] Age: 59 Y 10 M 10 D Return Phone Number: [phone] (Primary), [phone] (Secondary) Address: [address] City/State/Zip: [place] [address] Client [facility] Night - Client Client Site [facility] - Night Physician Tillman Abide Contact Type Call Call Type Triage / Clinical Relationship To Patient Self Return Phone Number [phone] (Primary) Chief Complaint Heart palpitations or irregular heartbeat Initial Comment Caller states she has been taking amoxicillin for two weeks and hydrocodone for a week (due to bad teeth). A couple of hours ago she started itching, sore neck, belching, constant urge to urinate and she feels like her heart is racing. GOTO Facility Not Listed memorial? PreDisposition Did not know what to do Nurse Assessment Nurse: Phebe Colla, RN, Dondra Spry Date/Time Lamount Cohen Time): 10/31/2014 4:46:54 AM Confirm and document reason for call. If symptomatic, describe symptoms. ---Caller states she has been taking amoxicillin for two weeks  and hydrocodone for a week (due to bad teeth). A couple of hours ago she started itching, sore neck, belching, constant urge to urinate and she feels like her heart is racing. no fever., no rash, able to touch chin to chest. Has the patient traveled out of the country within the last 30 days? ---Not Applicable Does the patient require triage? ---Yes Related visit to physician within the last 2 weeks? ---Yes Does the PT have any chronic conditions? (i.e. diabetes, asthma, etc.) ---Yes List chronic conditions. ---htn, borderline dm Guidelines Guideline Title Affirmed Question Affirmed Notes Nurse Date/Time (Eastern Time) Heart Rate and Heartbeat Questions Dizziness, lightheadedness, or weakness Williemae Natter 10/31/2014 4:50:23 AM Disp. Time Lamount Cohen Time) Disposition Final User 10/31/2014 4:51:47 AM Go to ED Now Yes Phebe Colla, RN, Suzi Roots Understands: Yes PLEASE NOTE: All timestamps contained within this report are represented as [place] Standard Time. CONFIDENTIALTY NOTICE: This fax transmission is intended only for the addressee. It contains information that is legally privileged, confidential or otherwise protected from use or disclosure. If you are not the intended recipient, you are strictly prohibited from reviewing, disclosing, copying using or disseminating any of this information or taking any action in reliance on or regarding this information. If you have received this fax in error, please notify us immediately by telephone so that we can arrange for its return to [place]. Phone: [phone], Toll-Free: [phone], Fax: [phone] Page: 2 of 2 Call Id: [id number] Disagree/Comply: Comply Care Advice Given Per Guideline GO TO ED NOW: You need to be seen in the Emergency Department. Go to the ER at ___________ Hospital. Leave now. Drive carefully. NOTE TO TRIAGER - DRIVING: * Another adult should drive. BRING MEDICINES: CARE ADVICE given per Palpitations (Adult)  guideline. After Care Instructions Given Call Event Type User Date /  Time Description Referrals GO TO FACILITY OTHER - SPECIFY

## 2014-10-31 NOTE — Telephone Encounter (Signed)
Check on her later The symptoms could be from the hydrocodone --so she should try without that. We will see what happens at the ER

## 2014-11-04 ENCOUNTER — Telehealth: Payer: Self-pay

## 2014-11-04 NOTE — Telephone Encounter (Signed)
I called and spoke with patient in regards to scheduling a mammogram. Patient states that she will call [facility] and get one scheduled.

## 2014-11-09 ENCOUNTER — Emergency Department
Admission: EM | Admit: 2014-11-09 | Discharge: 2014-11-09 | Disposition: A | Discharge status: Home / Self Care | Payer: 59 | Attending: Emergency Medicine | Admitting: Emergency Medicine

## 2014-11-09 ENCOUNTER — Encounter: Payer: Self-pay | Admitting: Emergency Medicine

## 2014-11-09 DIAGNOSIS — L299 Pruritus, unspecified: Secondary | ICD-10-CM | POA: Diagnosis not present

## 2014-11-09 DIAGNOSIS — I10 Essential (primary) hypertension: Secondary | ICD-10-CM | POA: Diagnosis present

## 2014-11-09 DIAGNOSIS — Z880 Allergy status to penicillin: Secondary | ICD-10-CM | POA: Diagnosis not present

## 2014-11-09 DIAGNOSIS — R1310 Dysphagia, unspecified: Secondary | ICD-10-CM | POA: Diagnosis not present

## 2014-11-09 DIAGNOSIS — Z79899 Other long term (current) drug therapy: Secondary | ICD-10-CM | POA: Diagnosis not present

## 2014-11-09 DIAGNOSIS — Z792 Long term (current) use of antibiotics: Secondary | ICD-10-CM | POA: Diagnosis not present

## 2014-11-09 DIAGNOSIS — F419 Anxiety disorder, unspecified: Secondary | ICD-10-CM | POA: Diagnosis not present

## 2014-11-09 DIAGNOSIS — E119 Type 2 diabetes mellitus without complications: Secondary | ICD-10-CM | POA: Insufficient documentation

## 2014-11-09 DIAGNOSIS — R5381 Other malaise: Secondary | ICD-10-CM | POA: Insufficient documentation

## 2014-11-09 DIAGNOSIS — Z7982 Long term (current) use of aspirin: Secondary | ICD-10-CM | POA: Diagnosis not present

## 2014-11-09 DIAGNOSIS — I159 Secondary hypertension, unspecified: Secondary | ICD-10-CM | POA: Insufficient documentation

## 2014-11-09 HISTORY — DX: Anxiety disorder, unspecified: F41.9

## 2014-11-09 LAB — COMPREHENSIVE METABOLIC PANEL
ALBUMIN: 4.2 g/dL (ref 3.5–5.0)
ALK PHOS: 75 U/L (ref 38–126)
ALT: 21 U/L (ref 14–54)
AST: 24 U/L (ref 15–41)
Anion gap: 6 (ref 5–15)
BILIRUBIN TOTAL: 0.8 mg/dL (ref 0.3–1.2)
BUN: 23 mg/dL — AB (ref 6–20)
CALCIUM: 8.9 mg/dL (ref 8.9–10.3)
CO2: 27 mmol/L (ref 22–32)
Chloride: 105 mmol/L (ref 101–111)
Creatinine, Ser: 0.99 mg/dL (ref 0.44–1.00)
GFR calc Af Amer: >60 mL/min (ref >60)
GLUCOSE: 143 mg/dL — AB (ref 65–99)
POTASSIUM: 3.7 mmol/L (ref 3.5–5.1)
Sodium: 138 mmol/L (ref 135–145)
TOTAL PROTEIN: 7.3 g/dL (ref 6.5–8.1)

## 2014-11-09 LAB — CBC WITH DIFFERENTIAL/PLATELET
BASOS ABS: 0 10*3/uL (ref 0–0.1)
Basophils Relative: 1 %
Eosinophils Absolute: 0.1 10*3/uL (ref 0–0.7)
Eosinophils Relative: 1 %
HEMATOCRIT: 40.5 % (ref 35.0–47.0)
HEMOGLOBIN: 13.5 g/dL (ref 12.0–16.0)
LYMPHS PCT: 39 %
Lymphs Abs: 2.3 10*3/uL (ref 1.0–3.6)
MCH: 31.1 pg (ref 26.0–34.0)
MCHC: 33.5 g/dL (ref 32.0–36.0)
MCV: 93.1 fL (ref 80.0–100.0)
MONO ABS: 0.4 10*3/uL (ref 0.2–0.9)
MONOS PCT: 6 %
NEUTROS ABS: 3.2 10*3/uL (ref 1.4–6.5)
NEUTROS PCT: 53 %
Platelets: 185 10*3/uL (ref 150–440)
RBC: 4.35 MIL/uL (ref 3.80–5.20)
RDW: 12.6 % (ref 11.5–14.5)
WBC: 6 10*3/uL (ref 3.6–11.0)

## 2014-11-09 LAB — URINE DRUG SCREEN, QUALITATIVE (ARMC ONLY)
AMPHETAMINES, UR SCREEN: NOT DETECTED
BENZODIAZEPINE, UR SCRN: NOT DETECTED
Barbiturates, Ur Screen: NOT DETECTED
COCAINE METABOLITE, UR ~~LOC~~: NOT DETECTED
Cannabinoid 50 Ng, Ur ~~LOC~~: NOT DETECTED
MDMA (ECSTASY) UR SCREEN: NOT DETECTED
METHADONE SCREEN, URINE: NOT DETECTED
OPIATE, UR SCREEN: NOT DETECTED
PHENCYCLIDINE (PCP) UR S: NOT DETECTED
Tricyclic, Ur Screen: NOT DETECTED

## 2014-11-09 LAB — URINALYSIS COMPLETE WITH MICROSCOPIC (ARMC ONLY)
BACTERIA UA: NONE SEEN
BILIRUBIN URINE: NEGATIVE
GLUCOSE, UA: NEGATIVE mg/dL
Hgb urine dipstick: NEGATIVE
KETONES UR: NEGATIVE mg/dL
LEUKOCYTES UA: NEGATIVE
Nitrite: NEGATIVE
Protein, ur: NEGATIVE mg/dL
RBC / HPF: NONE SEEN RBC/hpf (ref 0–5)
SQUAMOUS EPITHELIAL / LPF: NONE SEEN
Specific Gravity, Urine: 1.003 — ABNORMAL LOW (ref 1.005–1.030)
pH: 6 (ref 5.0–8.0)

## 2014-11-09 LAB — TSH: TSH: 4.026 u[IU]/mL (ref 0.350–4.500)

## 2014-11-09 LAB — TROPONIN I

## 2014-11-09 LAB — ETHANOL: Alcohol, Ethyl (B): <5 mg/dL (ref <5)

## 2014-11-09 MED ORDER — LORAZEPAM 2 MG/ML IJ SOLN
INTRAMUSCULAR | Status: AC
  Administered 2014-11-09: 0.5 mg via INTRAVENOUS
  Filled 2014-11-09: qty 1

## 2014-11-09 MED ORDER — LORAZEPAM 2 MG/ML IJ SOLN
0.5000 mg | Freq: Once | INTRAMUSCULAR | Status: AC
  Administered 2014-11-09: 0.5 mg via INTRAVENOUS

## 2014-11-09 MED ORDER — ONDANSETRON HCL 4 MG/2ML IJ SOLN
INTRAMUSCULAR | Status: AC
  Administered 2014-11-09: 4 mg via INTRAVENOUS
  Filled 2014-11-09: qty 2

## 2014-11-09 MED ORDER — ONDANSETRON HCL 4 MG/2ML IJ SOLN
4.0000 mg | Freq: Once | INTRAMUSCULAR | Status: AC
  Administered 2014-11-09: 4 mg via INTRAVENOUS

## 2014-11-09 NOTE — ED Notes (Signed)
Pt arrived to the ED for complaints of HTN, difficulty swallowing, itchiness and anxiety. Pt's daughter states that this happens every time that the Pt forgets to take her medication, and the diagnosis is always anxiety/panick attack. Pt is AOx4 in no apparent distress.

## 2014-11-09 NOTE — Discharge Instructions (Signed)
1. Continue all medicines as directed by your doctor. 2. Return to the ER for worsening symptoms, persistent vomiting, difficulty breathing or other concerns.  Panic Attacks Panic attacks are sudden, short-livedsurges of severe anxiety, fear, or discomfort. They may occur for no reason when you are relaxed, when you are anxious, or when you are sleeping. Panic attacks may occur for a number of reasons:   Healthy people occasionally have panic attacks in extreme, life-threatening situations, such as war or natural disasters. Normal anxiety is a protective mechanism of the body that helps [place] react to danger (fight or flight response).  Panic attacks are often seen with anxiety disorders, such as panic disorder, social anxiety disorder, generalized anxiety disorder, and phobias. Anxiety disorders cause excessive or uncontrollable anxiety. They may interfere with your relationships or other life activities.  Panic attacks are sometimes seen with other mental illnesses, such as depression and posttraumatic stress disorder.  Certain medical conditions, prescription medicines, and drugs of abuse can cause panic attacks. SYMPTOMS  Panic attacks start suddenly, peak within 20 minutes, and are accompanied by four or more of the following symptoms:  Pounding heart or fast heart rate (palpitations).  Sweating.  Trembling or shaking.  Shortness of breath or feeling smothered.  Feeling choked.  Chest pain or discomfort.  Nausea or strange feeling in your stomach.  Dizziness, light-headedness, or feeling like you will faint.  Chills or hot flushes.  Numbness or tingling in your lips or hands and feet.  Feeling that things are not real or feeling that you are not yourself.  Fear of losing control or going crazy.  Fear of dying. Some of these symptoms can mimic serious medical conditions. For example, you may think you are having a heart attack. Although panic attacks can be very scary, they  are not life threatening. DIAGNOSIS  Panic attacks are diagnosed through an assessment by your health care provider. Your health care provider will ask questions about your symptoms, such as where and when they occurred. Your health care provider will also ask about your medical history and use of alcohol and drugs, including prescription medicines. Your health care provider may order blood tests or other studies to rule out a serious medical condition. Your health care provider may refer you to a mental health professional for further evaluation. TREATMENT   Most healthy people who have one or two panic attacks in an extreme, life-threatening situation will not require treatment.  The treatment for panic attacks associated with anxiety disorders or other mental illness typically involves counseling with a mental health professional, medicine, or a combination of both. Your health care provider will help determine what treatment is best for you.  Panic attacks due to physical illness usually go away with treatment of the illness. If prescription medicine is causing panic attacks, talk with your health care provider about stopping the medicine, decreasing the dose, or substituting another medicine.  Panic attacks due to alcohol or drug abuse go away with abstinence. Some adults need professional help in order to stop drinking or using drugs. HOME CARE INSTRUCTIONS   Take all medicines as directed by your health care provider.   Schedule and attend follow-up visits as directed by your health care provider. It is important to keep all your appointments. SEEK MEDICAL CARE IF:  You are not able to take your medicines as prescribed.  Your symptoms do not improve or get worse. SEEK IMMEDIATE MEDICAL CARE IF:   You experience panic attack symptoms that  are different than your usual symptoms.  You have serious thoughts about hurting yourself or others.  You are taking medicine for panic attacks  and have a serious side effect. MAKE SURE YOU:  Understand these instructions.  Will watch your condition.  Will get help right away if you are not doing well or get worse. Document Released: 02/01/2005 Document Revised: 02/06/2013 Document Reviewed: 09/15/2012 [facility] Patient Information 2015 [place], [place]. This information is not intended to replace advice given to you by your health care provider. Make sure you discuss any questions you have with your health care provider.   Hypertension Hypertension, commonly called high blood pressure, is when the force of blood pumping through your arteries is too strong. Your arteries are the blood vessels that carry blood from your heart throughout your body. A blood pressure reading consists of a higher number over a lower number, such as 110/72. The higher number (systolic) is the pressure inside your arteries when your heart pumps. The lower number (diastolic) is the pressure inside your arteries when your heart relaxes. Ideally you want your blood pressure below 120/80. Hypertension forces your heart to work harder to pump blood. Your arteries may become narrow or stiff. Having hypertension puts you at risk for heart disease, stroke, and other problems.  RISK FACTORS Some risk factors for high blood pressure are controllable. Others are not.  Risk factors you cannot control include:   Race. You may be at higher risk if you are African American.  Age. Risk increases with age.  Gender. Men are at higher risk than women before age 73 years. After age 71, women are at higher risk than men. Risk factors you can control include:  Not getting enough exercise or physical activity.  Being overweight.  Getting too much fat, sugar, calories, or salt in your diet.  Drinking too much alcohol. SIGNS AND SYMPTOMS Hypertension does not usually cause signs or symptoms. Extremely high blood pressure (hypertensive crisis) may cause headache, anxiety,  shortness of breath, and nosebleed. DIAGNOSIS  To check if you have hypertension, your health care provider will measure your blood pressure while you are seated, with your arm held at the level of your heart. It should be measured at least twice using the same arm. Certain conditions can cause a difference in blood pressure between your right and left arms. A blood pressure reading that is higher than normal on one occasion does not mean that you need treatment. If one blood pressure reading is high, ask your health care provider about having it checked again. TREATMENT  Treating high blood pressure includes making lifestyle changes and possibly taking medicine. Living a healthy lifestyle can help lower high blood pressure. You may need to change some of your habits. Lifestyle changes may include:  Following the DASH diet. This diet is high in fruits, vegetables, and whole grains. It is low in salt, red meat, and added sugars.  Getting at least 2 hours of brisk physical activity every week.  Losing weight if necessary.  Not smoking.  Limiting alcoholic beverages.  Learning ways to reduce stress. If lifestyle changes are not enough to get your blood pressure under control, your health care provider may prescribe medicine. You may need to take more than one. Work closely with your health care provider to understand the risks and benefits. HOME CARE INSTRUCTIONS  Have your blood pressure rechecked as directed by your health care provider.   Take medicines only as directed by your health care  provider. Follow the directions carefully. Blood pressure medicines must be taken as prescribed. The medicine does not work as well when you skip doses. Skipping doses also puts you at risk for problems.   Do not smoke.   Monitor your blood pressure at home as directed by your health care provider. SEEK MEDICAL CARE IF:   You think you are having a reaction to medicines taken.  You have  recurrent headaches or feel dizzy.  You have swelling in your ankles.  You have trouble with your vision. SEEK IMMEDIATE MEDICAL CARE IF:  You develop a severe headache or confusion.  You have unusual weakness, numbness, or feel faint.  You have severe chest or abdominal pain.  You vomit repeatedly.  You have trouble breathing. MAKE SURE YOU:   Understand these instructions.  Will watch your condition.  Will get help right away if you are not doing well or get worse. Document Released: 02/01/2005 Document Revised: 06/18/2013 Document Reviewed: 11/24/2012 [facility] Patient Information 2015 [place], [place]. This information is not intended to replace advice given to you by your health care provider. Make sure you discuss any questions you have with your health care provider.

## 2014-11-09 NOTE — ED Provider Notes (Signed)
[facility] Emergency Department Provider Note  ____________________________________________  Time seen: Approximately 3:24 AM  I have reviewed the triage vital signs and the nursing notes.   HISTORY  Chief Complaint Dysphagia; Pruritis; Anxiety; and Hypertension    HPI Tami Jones is a 59 y.o. female who presents to the ED from home with a chief complaint of elevated blood pressure associated with difficulty swallowing, itchiness and anxiety. Patient states she awoke approximately 1-1/2 hours prior to arrival feeling this way. States she has these symptoms every time she forgets to take her blood pressure medication, which she forgot yesterday. Patient did take her nighttime metoprolol prior to arrival. Admits to recent increased stressors including sick family member as well as increased stressors at work. Denies SI/HI/AH/VH. Denies recent fevers, chills, chest pain, shortness of breath, abdominal pain, vomiting, diarrhea. States when her blood pressure is high she experiences headache and neck pain without associated vision changes. Voicing no complaints of headache or neck pain currently.Currently taking amoxicillin for dental infection.   Past Medical History  Diagnosis Date  . Diabetes mellitus   . GERD (gastroesophageal reflux disease)   . Hyperlipidemia   . CVA (cerebral vascular accident) 03/2006    right occipital, Dr. Thad Ranger  . Hypertension   . Anxiety     Patient Active Problem List   Diagnosis Date Noted  . Dental infection 08/07/2014  . Cerebrovascular disease 07/17/2014  . Preventative health care 06/25/2014  . Right sided sciatica 03/08/2013  . MDD (major depressive disorder) 09/27/2012  . MENOPAUSAL SYNDROME 09/10/2008  . Mood disorder 09/15/2007  . Essential hypertension, benign 08/10/2006  . Diabetes mellitus type 2, controlled, without complications 06/17/2006  . HYPERCHOLESTEROLEMIA 06/17/2006  . GERD 06/17/2006    Past  Surgical History  Procedure Laterality Date  . Cesarean section    . Tonsillectomy and adenoidectomy    . Esophagogastroduodenoscopy  05/2005  . Cardiovascular stress test  1/15    myoview. EF 60%    Current Outpatient Rx  Name  Route  Sig  Dispense  Refill  . ALPRAZolam (XANAX) 0.25 MG tablet   Oral   Take 1 tablet (0.25 mg total) by mouth 2 (two) times daily as needed for anxiety.   30 tablet   0   . amoxicillin (AMOXIL) 500 MG tablet   Oral   Take 2 tablets (1,000 mg total) by mouth 2 (two) times daily.   40 tablet   2   . aspirin 81 MG tablet   Oral   Take 81 mg by mouth daily.           Marland Kitchen atorvastatin (LIPITOR) 20 MG tablet   Oral   Take 1 tablet (20 mg total) by mouth daily.   90 tablet   3   . lidocaine (LIDODERM) 5 %   Transdermal   Place 1 patch onto the skin daily. Remove & Discard patch within 12 hours or as directed by MD         . lisinopril (PRINIVIL,ZESTRIL) 20 MG tablet   Oral   Take 40 mg by mouth daily.         . metoprolol succinate (TOPROL-XL) 100 MG 24 hr tablet   Oral   Take 1 tablet (100 mg total) by mouth daily. Take with or immediately following a meal.   90 tablet   3   . cyclobenzaprine (FLEXERIL) 10 MG tablet   Oral   Take 1 tablet (10 mg total) by mouth 3 (  three) times daily as needed for muscle spasms.   15 tablet   1   . hydrochlorothiazide (HYDRODIURIL) 25 MG tablet   Oral   Take 1 tablet (25 mg total) by mouth daily.   90 tablet   3   . ondansetron (ZOFRAN) 4 MG tablet   Oral   Take 1 tablet (4 mg total) by mouth daily as needed for nausea or vomiting.   10 tablet   1   . potassium chloride SA (K-DUR,KLOR-CON) 20 MEQ tablet   Oral   Take 1 tablet (20 mEq total) by mouth daily.   90 tablet   3     Allergies Citalopram; Doxycycline hyclate; Erythromycin; Montelukast sodium; Nitrofurantoin; Sulfa antibiotics; Tramadol hcl; Venlafaxine hcl; and Amoxicillin-pot clavulanate  Family History  Problem Relation  Age of Onset  . Coronary artery disease Mother   . Diabetes Mellitus II Mother   . Heart attack Mother 36  . Diabetes Mellitus II Maternal Grandmother     Social History Social History  Substance Use Topics  . Smoking status: Never Smoker   . Smokeless tobacco: Never Used  . Alcohol Use: No     Comment: wine occassionally    Review of Systems Constitutional: Positive for generalized malaise. No fever/chills. Eyes: No visual changes. ENT: No sore throat. Cardiovascular: Denies chest pain. Respiratory: Denies shortness of breath. Gastrointestinal: No abdominal pain.  No nausea, no vomiting.  No diarrhea.  No constipation. Genitourinary: Negative for dysuria. Musculoskeletal: Negative for back pain. Skin: Negative for rash. Neurological: Negative for headaches, focal weakness or numbness. Psychiatric:Positive for increased stressors. Positive for anxiety.  10-point ROS otherwise negative.  ____________________________________________   PHYSICAL EXAM:  VITAL SIGNS: ED Triage Vitals  Enc Vitals Group     BP 11/09/14 0256 232/109 mmHg     Pulse Rate 11/09/14 0256 84     Resp 11/09/14 0256 18     Temp 11/09/14 0256 97.7 F (36.5 C)     Temp Source 11/09/14 0256 Oral     SpO2 11/09/14 0256 99 %     Weight 11/09/14 0256 189 lb (85.73 kg)     Height 11/09/14 0256  (1.626 m)     Head Cir --      Peak Flow --      Pain Score 11/09/14 0307 0     Pain Loc --      Pain Edu? --      Excl. in GC? --     Constitutional: Alert and oriented. Well appearing and in mild acute distress. Tearful and anxious. Eyes: Conjunctivae are normal. PERRL. EOMI. Head: Atraumatic. Nose: No congestion/rhinnorhea. Mouth/Throat: Mucous membranes are moist.  Oropharynx non-erythematous. Neck: No stridor. No carotid bruits. No thyromegaly. Cardiovascular: Normal rate, regular rhythm. Grossly normal heart sounds.  Good peripheral circulation. Respiratory: Normal respiratory effort.  No  retractions. Lungs CTAB. Gastrointestinal: Soft and nontender. No distention. No abdominal bruits. No CVA tenderness. Musculoskeletal: No lower extremity tenderness nor edema.  No joint effusions. Neurologic:  Normal speech and language. No gross focal neurologic deficits are appreciated. No gait instability. Skin:  Skin is warm, dry and intact. No rash noted. Psychiatric: Mood and affect are anxious. Speech and behavior are normal.  ____________________________________________   LABS (all labs ordered are listed, but only abnormal results are displayed)  Labs Reviewed  COMPREHENSIVE METABOLIC PANEL - Abnormal; Notable for the following:    Glucose, Bld 143 (*)    BUN 23 (*)    All  other components within normal limits  URINALYSIS COMPLETEWITH MICROSCOPIC ([facility] ONLY) - Abnormal; Notable for the following:    Color, Urine COLORLESS (*)    APPearance CLEAR (*)    Specific Gravity, Urine 1.003 (*)    All other components within normal limits  CBC WITH DIFFERENTIAL/PLATELET  TROPONIN I  URINE DRUG SCREEN, QUALITATIVE ([facility] ONLY)  ETHANOL  TSH   ____________________________________________  EKG  ED ECG REPORT I, SUNG,JADE J, the attending physician, personally viewed and interpreted this ECG.   Date: 11/09/2014  EKG Time: 0345  Rate: 65  Rhythm: normal EKG, normal sinus rhythm  Axis: Normal  Intervals:none  ST&T Change: Nonspecific  ____________________________________________  RADIOLOGY  None ____________________________________________   PROCEDURES  Procedure(s) performed: None  Critical Care performed: No  ____________________________________________   INITIAL IMPRESSION / ASSESSMENT AND PLAN / ED COURSE  Pertinent labs & imaging results that were available during my care of the patient were reviewed by me and considered in my medical decision making (see chart for details).  59 year old female who presents with elevated blood pressure associated with  generalized itching, difficulty swallowing and anxiety. Similar symptoms previously associated with elevated blood pressure. There is no rash or evidence of airway distress on exam. Will obtain screening lab work including TSH, administer low dose anxiolytic per patient's request and reassess.  ----------------------------------------- 5:25 AM on 11/09/2014 -----------------------------------------  Blood pressure improved 143/82. Patient feels better overall. Updated patient of laboratory and urinalysis results. Patient will follow-up with her PCP next week. Will also refer patient to [facility]. Strict return precautions given. Patient daughter verbalize understanding and agree with plan of care. ____________________________________________   FINAL CLINICAL IMPRESSION(S) / ED DIAGNOSES  Final diagnoses:  Secondary hypertension, unspecified  Anxiety      Irean Hong, MD 11/09/14 [phone]

## 2014-11-12 ENCOUNTER — Ambulatory Visit: Payer: 59 | Admitting: Internal Medicine

## 2014-11-13 ENCOUNTER — Ambulatory Visit: Payer: 59 | Admitting: Cardiovascular Disease

## 2014-12-02 ENCOUNTER — Telehealth: Payer: Self-pay | Admitting: Internal Medicine

## 2014-12-02 ENCOUNTER — Encounter: Payer: Self-pay | Admitting: Emergency Medicine

## 2014-12-02 ENCOUNTER — Emergency Department
Admission: EM | Admit: 2014-12-02 | Discharge: 2014-12-03 | Disposition: A | Discharge status: Home / Self Care | Payer: 59 | Attending: Emergency Medicine | Admitting: Emergency Medicine

## 2014-12-02 ENCOUNTER — Emergency Department: Payer: 59

## 2014-12-02 DIAGNOSIS — Z880 Allergy status to penicillin: Secondary | ICD-10-CM | POA: Diagnosis not present

## 2014-12-02 DIAGNOSIS — F410 Panic disorder [episodic paroxysmal anxiety] without agoraphobia: Secondary | ICD-10-CM | POA: Diagnosis not present

## 2014-12-02 DIAGNOSIS — E119 Type 2 diabetes mellitus without complications: Secondary | ICD-10-CM | POA: Diagnosis not present

## 2014-12-02 DIAGNOSIS — I10 Essential (primary) hypertension: Secondary | ICD-10-CM | POA: Insufficient documentation

## 2014-12-02 DIAGNOSIS — Z792 Long term (current) use of antibiotics: Secondary | ICD-10-CM | POA: Insufficient documentation

## 2014-12-02 DIAGNOSIS — Z7982 Long term (current) use of aspirin: Secondary | ICD-10-CM | POA: Insufficient documentation

## 2014-12-02 DIAGNOSIS — R079 Chest pain, unspecified: Secondary | ICD-10-CM | POA: Diagnosis present

## 2014-12-02 DIAGNOSIS — Z79899 Other long term (current) drug therapy: Secondary | ICD-10-CM | POA: Insufficient documentation

## 2014-12-02 LAB — CBC
HEMATOCRIT: 41.3 % (ref 35.0–47.0)
HEMOGLOBIN: 14.2 g/dL (ref 12.0–16.0)
MCH: 31.7 pg (ref 26.0–34.0)
MCHC: 34.2 g/dL (ref 32.0–36.0)
MCV: 92.5 fL (ref 80.0–100.0)
Platelets: 195 10*3/uL (ref 150–440)
RBC: 4.47 MIL/uL (ref 3.80–5.20)
RDW: 12.4 % (ref 11.5–14.5)
WBC: 6 10*3/uL (ref 3.6–11.0)

## 2014-12-02 LAB — BASIC METABOLIC PANEL
ANION GAP: 9 (ref 5–15)
BUN: 22 mg/dL — ABNORMAL HIGH (ref 6–20)
CHLORIDE: 105 mmol/L (ref 101–111)
CO2: 26 mmol/L (ref 22–32)
CREATININE: 1.15 mg/dL — AB (ref 0.44–1.00)
Calcium: 9.3 mg/dL (ref 8.9–10.3)
GFR calc non Af Amer: 52 mL/min — ABNORMAL LOW (ref >60)
Glucose, Bld: 181 mg/dL — ABNORMAL HIGH (ref 65–99)
POTASSIUM: 3.9 mmol/L (ref 3.5–5.1)
SODIUM: 140 mmol/L (ref 135–145)

## 2014-12-02 LAB — TROPONIN I

## 2014-12-02 MED ORDER — LISINOPRIL 20 MG PO TABS
20.0000 mg | ORAL_TABLET | Freq: Once | ORAL | Status: AC
  Administered 2014-12-02: 20 mg via ORAL
  Filled 2014-12-02: qty 1

## 2014-12-02 MED ORDER — LORAZEPAM 1 MG PO TABS
1.0000 mg | ORAL_TABLET | Freq: Once | ORAL | Status: AC
  Administered 2014-12-02: 1 mg via ORAL

## 2014-12-02 MED ORDER — LORAZEPAM 1 MG PO TABS
ORAL_TABLET | ORAL | Status: AC
  Administered 2014-12-02: 1 mg via ORAL
  Filled 2014-12-02: qty 1

## 2014-12-02 MED ORDER — HYDROCHLOROTHIAZIDE 25 MG PO TABS
25.0000 mg | ORAL_TABLET | Freq: Once | ORAL | Status: AC
  Administered 2014-12-02: 25 mg via ORAL
  Filled 2014-12-02: qty 1

## 2014-12-02 MED ORDER — GI COCKTAIL ~~LOC~~
30.0000 mL | Freq: Once | ORAL | Status: AC
  Administered 2014-12-02: 30 mL via ORAL
  Filled 2014-12-02: qty 30

## 2014-12-02 MED ORDER — METOPROLOL SUCCINATE ER 50 MG PO TB24
100.0000 mg | ORAL_TABLET | Freq: Every day | ORAL | Status: DC
  Administered 2014-12-02: 100 mg via ORAL
  Filled 2014-12-02: qty 2

## 2014-12-02 NOTE — ED Notes (Signed)
Pt presents with chest pain, tightness in nature started yesterday and no relief after taking tums. [facility]. No acute distress noted in triagel

## 2014-12-02 NOTE — ED Provider Notes (Signed)
[facility] Emergency Department Provider Note  ____________________________________________  Time seen: Approximately 9:10 PM  I have reviewed the triage vital signs and the nursing notes.   HISTORY  Chief Complaint Chest Pain    HPI Duke SalviaCynthia B Paske is a 59 y.o. female with a history of GERD and hypertension who is presenting tonight with chest pressure over the past 24 hours.She said the pressure started last night and is midsternal and nonradiating. She says it is worse with eating. Denies any shortness of breath, nausea vomiting or diarrhea. Per family this happened multiple times in the past which forgets take her meds. The patient says that she did not take her medications today because she was out of her prescriptions for metoprolol as well as lisinopril. She [facility] her prescriptions [facility] at [facility] but she says it was taking 5-6 hours for them to prepare them. She has no exertional exacerbation of her chest pain.   Past Medical History  Diagnosis Date  . Diabetes mellitus   . GERD (gastroesophageal reflux disease)   . Hyperlipidemia   . CVA (cerebral vascular accident) (HCC) 03/2006    right occipital, Dr. Thad Rangereynolds  . Hypertension   . Anxiety     Patient Active Problem List   Diagnosis Date Noted  . Dental infection 08/07/2014  . Cerebrovascular disease 07/17/2014  . Preventative health care 06/25/2014  . Right sided sciatica 03/08/2013  . MDD (major depressive disorder) (HCC) 09/27/2012  . MENOPAUSAL SYNDROME 09/10/2008  . Mood disorder (HCC) 09/15/2007  . Essential hypertension, benign 08/10/2006  . Diabetes mellitus type 2, controlled, without complications (HCC) 06/17/2006  . HYPERCHOLESTEROLEMIA 06/17/2006  . GERD 06/17/2006    Past Surgical History  Procedure Laterality Date  . Cesarean section    . Tonsillectomy and adenoidectomy    . Esophagogastroduodenoscopy  05/2005  . Cardiovascular stress test  1/15    myoview. EF 60%     Current Outpatient Rx  Name  Route  Sig  Dispense  Refill  . ALPRAZolam (XANAX) 0.25 MG tablet   Oral   Take 1 tablet (0.25 mg total) by mouth 2 (two) times daily as needed for anxiety.   30 tablet   0   . aspirin 81 MG tablet   Oral   Take 81 mg by mouth daily.           Marland Kitchen. atorvastatin (LIPITOR) 20 MG tablet   Oral   Take 1 tablet (20 mg total) by mouth daily.   90 tablet   3   . ibuprofen (ADVIL,MOTRIN) 200 MG tablet   Oral   Take 200 mg by mouth every 6 (six) hours as needed.         . lidocaine (LIDODERM) 5 %   Transdermal   Place 1 patch onto the skin daily. Remove & Discard patch within 12 hours or as directed by MD         . lisinopril (PRINIVIL,ZESTRIL) 20 MG tablet   Oral   Take 40 mg by mouth daily.         . metoprolol succinate (TOPROL-XL) 100 MG 24 hr tablet   Oral   Take 1 tablet (100 mg total) by mouth daily. Take with or immediately following a meal.   90 tablet   3   . amoxicillin (AMOXIL) 500 MG tablet   Oral   Take 2 tablets (1,000 mg total) by mouth 2 (two) times daily. Patient not taking: Reported on 12/02/2014   40  tablet   2   . cyclobenzaprine (FLEXERIL) 10 MG tablet   Oral   Take 1 tablet (10 mg total) by mouth 3 (three) times daily as needed for muscle spasms.   15 tablet   1   . hydrochlorothiazide (HYDRODIURIL) 25 MG tablet   Oral   Take 1 tablet (25 mg total) by mouth daily.   90 tablet   3   . ondansetron (ZOFRAN) 4 MG tablet   Oral   Take 1 tablet (4 mg total) by mouth daily as needed for nausea or vomiting. Patient not taking: Reported on 12/02/2014   10 tablet   1     Allergies Citalopram; Doxycycline hyclate; Erythromycin; Montelukast sodium; Nitrofurantoin; Sulfa antibiotics; Tramadol hcl; Venlafaxine hcl; and Amoxicillin-pot clavulanate  Family History  Problem Relation Age of Onset  . Coronary artery disease Mother   . Diabetes Mellitus II Mother   . Heart attack Mother 41  . Diabetes Mellitus  II Maternal Grandmother     Social History Social History  Substance Use Topics  . Smoking status: Never Smoker   . Smokeless tobacco: Never Used  . Alcohol Use: No     Comment: wine occassionally    Review of Systems Constitutional: No fever/chills Eyes: No visual changes. ENT: No sore throat. Cardiovascular: As above  Respiratory: Denies shortness of breath. Gastrointestinal: No abdominal pain.  No nausea, no vomiting.  No diarrhea.  No constipation. Genitourinary: Negative for dysuria. Musculoskeletal: Negative for back pain. Skin: Negative for rash. Neurological: Negative for headaches, focal weakness or numbness.  10-point ROS otherwise negative.  ____________________________________________   PHYSICAL EXAM:  VITAL SIGNS: ED Triage Vitals  Enc Vitals Group     BP 12/02/14 2037 215/88 mmHg     Pulse Rate 12/02/14 2037 66     Resp 12/02/14 2037 18     Temp 12/02/14 2037 98.1 F (36.7 C)     Temp Source 12/02/14 2037 Oral     SpO2 12/02/14 2037 100 %     Weight 12/02/14 2037 209 lb (94.802 kg)     Height 12/02/14 2037  (1.626 m)     Head Cir --      Peak Flow --      Pain Score 12/02/14 1817 6     Pain Loc --      Pain Edu? --      Excl. in GC? --     Constitutional: Alert and oriented. Well appearing and in no acute distress. Eyes: Conjunctivae are normal. PERRL. EOMI. Head: Atraumatic. Nose: No congestion/rhinnorhea. Mouth/Throat: Mucous membranes are moist.  Oropharynx non-erythematous. Neck: No stridor.   Cardiovascular: Normal rate, regular rhythm. Grossly normal heart sounds.  Good peripheral circulation. Chest pain is not reproducible to palpation. Respiratory: Normal respiratory effort.  No retractions. Lungs CTAB. Gastrointestinal: Soft and nontender. No distention. No abdominal bruits. No CVA tenderness. Musculoskeletal: No lower extremity tenderness nor edema.  No joint effusions. Neurologic:  Normal speech and language. No gross focal  neurologic deficits are appreciated. No gait instability. Skin:  Skin is warm, dry and intact. No rash noted. Psychiatric: Mood and affect are normal. Speech and behavior are normal.  ____________________________________________   LABS (all labs ordered are listed, but only abnormal results are displayed)  Labs Reviewed  BASIC METABOLIC PANEL - Abnormal; Notable for the following:    Glucose, Bld 181 (*)    BUN 22 (*)    Creatinine, Ser 1.15 (*)    GFR calc non  Af Amer 52 (*)    All other components within normal limits  TROPONIN I  CBC   ____________________________________________  EKG  ED ECG REPORT I, Zayd Bonet,  Teena Irani, the attending physician, personally viewed and interpreted this ECG.   Date: 12/02/2014  EKG Time: 1818  Rate: 83  Rhythm: normal sinus rhythm  Axis: Normal axis  Intervals:none  ST&T Change: No ST segment elevation or depression. No abnormal T-wave inversions. LVH seen on previous EKGs.  ____________________________________________  RADIOLOGY  Normal chest x-ray. I personally reviewed these films. ____________________________________________   PROCEDURES    ____________________________________________   INITIAL IMPRESSION / ASSESSMENT AND PLAN / ED COURSE  Pertinent labs & imaging results that were available during my care of the patient were reviewed by me and considered in my medical decision making (see chart for details).  ----------------------------------------- 11:18 PM on 12/02/2014 -----------------------------------------  Patient did have a panic attack in the emergency department and was given Ativan as well as metoprolol. At this time she is not claiming of any pain. She had said previously when she was having a panic attack as she was having a headache and feeling very anxious. However, her headache as well as chest pain and anxiety is all resolved now. Additionally, her daughter is at the bedside and says that she has had  multiple of these episodes. The patient says that she has an appointment at 945 tomorrow morning with her primary care doctor with Dr. Orlean Bradford. She is also able to pick up her prescriptions at [facility] tomorrow. We'll be discharged home. The patient's are understanding of the plan and willing to comply. ____________________________________________   FINAL CLINICAL IMPRESSION(S) / ED DIAGNOSES  Acute panic attack. Return visit.    Myrna Blazer, MD 12/02/14 [phone]

## 2014-12-02 NOTE — ED Notes (Signed)
Pt is exhibiting increased RR, anxiousness, and onset of 7/10 H/A pain she states is localized to her forehead area. MD notified.

## 2014-12-02 NOTE — Telephone Encounter (Signed)
Pt has appt 12/03/14 with Dr Ermalene SearingBedsole.

## 2014-12-02 NOTE — Telephone Encounter (Signed)
Patient Name: [name]  DOB: 07/13/1955    Initial Comment Caller states that that she has indigestion, belching, hard time breathing more so after she eats.    Nurse Assessment  Nurse: Annye Englisharmon, RN, Denise Date/Time (Eastern Time): 12/02/2014 3:17:06 PM  Confirm and document reason for call. If symptomatic, describe symptoms. ---Caller states that that she has indigestion, belching, hard time breathing more so after she eats. Took TUMs last night and it helped but today she cont to have the indigestion, sore throat from acid reflux, abd bloating, and chest tightness w/diff breathing.  Has the patient traveled out of the country within the last 30 days? ---Not Applicable  Does the patient have any new or worsening symptoms? ---Yes  Will a triage be completed? ---Yes  Related visit to physician within the last 2 weeks? ---No  Does the PT have any chronic conditions? (i.e. diabetes, asthma, etc.) ---No     Guidelines    Guideline Title Affirmed Question Affirmed Notes  Abdominal Pain - Upper [1] Pain lasts > 10 minutes AND [2] age > 50 Chest pain w/diff breathing after eating. Pain started yesterday   Final Disposition User   Go to ED Now Carmon, RN, [employer] PartnersDenise    Referrals  [facility] - ED   Disagree/Comply: Comply

## 2014-12-03 ENCOUNTER — Ambulatory Visit: Payer: 59 | Admitting: Family Medicine

## 2014-12-03 NOTE — Telephone Encounter (Signed)
Agreed -

## 2014-12-05 ENCOUNTER — Ambulatory Visit: Payer: 59 | Admitting: Internal Medicine

## 2014-12-11 ENCOUNTER — Encounter: Payer: Self-pay | Admitting: Internal Medicine

## 2014-12-11 ENCOUNTER — Ambulatory Visit (INDEPENDENT_AMBULATORY_CARE_PROVIDER_SITE_OTHER): Payer: 59 | Admitting: Internal Medicine

## 2014-12-11 VITALS — BP 116/78 | HR 47 | Temp 98.6°F | Ht 64.0 in | Wt 217.0 lb

## 2014-12-11 DIAGNOSIS — I10 Essential (primary) hypertension: Secondary | ICD-10-CM | POA: Diagnosis not present

## 2014-12-11 DIAGNOSIS — F39 Unspecified mood [affective] disorder: Secondary | ICD-10-CM

## 2014-12-11 DIAGNOSIS — K219 Gastro-esophageal reflux disease without esophagitis: Secondary | ICD-10-CM | POA: Diagnosis not present

## 2014-12-11 MED ORDER — OMEPRAZOLE 20 MG PO CPDR: 20.0000 mg | DELAYED_RELEASE_CAPSULE | Freq: Every day | ORAL | Status: DC

## 2014-12-11 MED ORDER — DULOXETINE HCL 30 MG PO CPEP: 30.0000 mg | ORAL_CAPSULE | Freq: Every day | ORAL | Status: DC

## 2014-12-11 MED ORDER — ALPRAZOLAM 0.25 MG PO TABS: 0.2500 mg | ORAL_TABLET | Freq: Two times a day (BID) | ORAL | Status: DC | PRN

## 2014-12-11 NOTE — Assessment & Plan Note (Signed)
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

## 2014-12-11 NOTE — Assessment & Plan Note (Signed)
She had frequent nocturnal symptoms that seem to be acid related Leads to her anxiety and sleep problems Will start daily omeprazole

## 2014-12-11 NOTE — Assessment & Plan Note (Signed)
BP Readings from Last 3 Encounters:  12/11/14 116/78  12/03/14 142/89  11/09/14 170/84   Very labile but okay when she takes her meds

## 2014-12-11 NOTE — Progress Notes (Signed)
Pre visit review using our clinic review tool, if applicable. No additional management support is needed unless otherwise documented below in the visit note. 

## 2014-12-11 NOTE — Progress Notes (Signed)
Subjective:    Patient ID: Tami Jones, female    DOB: 1955/03/30, 59 y.o.   MRN: [id number]  HPI Here for ER follow up  Reviewed both notes in past month Ran out of metoprolol once Then to urgent care for heartburn symptoms--but sent to ER due to high BP Had funny feeling after getting lisinopril and HCTZ--- dizzy and then BP went higher She didn't feel she was having an anxiety attack though  Still has anxiety--but alprazolam does help Chronic sleep problems---then winds up getting up late and doesn't get to work on time  Does have some substernal soreness at times still Notices it with belching Not related to eating though Some SOB--notices when lying down at night Gets "fluttering" at the same time  Current Outpatient Prescriptions on File Prior to Visit  Medication Sig Dispense Refill  . ALPRAZolam (XANAX) 0.25 MG tablet Take 1 tablet (0.25 mg total) by mouth 2 (two) times daily as needed for anxiety. 30 tablet 0  . aspirin 81 MG tablet Take 81 mg by mouth daily.      Marland Kitchen atorvastatin (LIPITOR) 20 MG tablet Take 1 tablet (20 mg total) by mouth daily. 90 tablet 3  . cyclobenzaprine (FLEXERIL) 10 MG tablet Take 1 tablet (10 mg total) by mouth 3 (three) times daily as needed for muscle spasms. 15 tablet 1  . hydrochlorothiazide (HYDRODIURIL) 25 MG tablet Take 1 tablet (25 mg total) by mouth daily. 90 tablet 3  . ibuprofen (ADVIL,MOTRIN) 200 MG tablet Take 200 mg by mouth every 6 (six) hours as needed.    . lidocaine (LIDODERM) 5 % Place 1 patch onto the skin daily. Remove & Discard patch within 12 hours or as directed by MD    . lisinopril (PRINIVIL,ZESTRIL) 20 MG tablet Take 40 mg by mouth daily.    . metoprolol succinate (TOPROL-XL) 100 MG 24 hr tablet Take 1 tablet (100 mg total) by mouth daily. Take with or immediately following a meal. 90 tablet 3  . ondansetron (ZOFRAN) 4 MG tablet Take 1 tablet (4 mg total) by mouth daily as needed for nausea or vomiting. 10 tablet 1    No current facility-administered medications on file prior to visit.    Allergies  Allergen Reactions  . Citalopram Other (See Comments)    intolerant  . Doxycycline Hyclate Other (See Comments)    REACTION: stomach upset  . Erythromycin   . Montelukast Sodium Other (See Comments)    REACTION: unspecified  . Nitrofurantoin Other (See Comments)    REACTION: unspecified  . Sulfa Antibiotics Nausea Only  . Tramadol Hcl Other (See Comments)    REACTION: Can not tolerate  . Venlafaxine Hcl Other (See Comments)    Intolerant  . Amoxicillin-Pot Clavulanate Nausea Only    Past Medical History  Diagnosis Date  . Diabetes mellitus   . GERD (gastroesophageal reflux disease)   . Hyperlipidemia   . CVA (cerebral vascular accident) (HCC) 03/2006    right occipital, Dr. Thad Ranger  . Hypertension   . Anxiety     Past Surgical History  Procedure Laterality Date  . Cesarean section    . Tonsillectomy and adenoidectomy    . Esophagogastroduodenoscopy  05/2005  . Cardiovascular stress test  1/15    myoview. EF 60%    Family History  Problem Relation Age of Onset  . Coronary artery disease Mother   . Diabetes Mellitus II Mother   . Heart attack Mother 25  . Diabetes Mellitus II  Maternal Grandmother     Social History   Social History  . Marital Status: Divorced    Spouse Name: N/A  . Number of Children: 2  . Years of Education: N/A   Occupational History  . LAB [employer]    Social History Main Topics  . Smoking status: Never Smoker   . Smokeless tobacco: Never Used  . Alcohol Use: No     Comment: wine occassionally  . Drug Use: No  . Sexual Activity: Yes    Birth Control/ Protection: Condom   Other Topics Concern  . Not on file   Social History Narrative   Review of Systems Appetite is variable Weight going up some though Trying exercise--but inconsistent    Objective:   Physical Exam  Constitutional: She appears well-developed and well-nourished. No  distress.  Neck: Normal range of motion. Neck supple. No thyromegaly present.  Cardiovascular: Normal rate, regular rhythm and normal heart sounds.  Exam reveals no gallop.   No murmur heard. Pulmonary/Chest: Effort normal and breath sounds normal. No respiratory distress. She has no wheezes.  Abdominal: Soft.  Mild epigastric tenderness  Musculoskeletal: She exhibits no edema.  Lymphadenopathy:    She has no cervical adenopathy.  Psychiatric:  Mild anxiety          Assessment & Plan:

## 2014-12-11 NOTE — Patient Instructions (Signed)
Please start the omeprazole for the acid and take every day on an empty stomach. Start the duloxetine for your nerves-- let me know if you have any problems.

## 2015-01-15 ENCOUNTER — Ambulatory Visit: Payer: 59 | Admitting: Internal Medicine

## 2015-01-15 ENCOUNTER — Telehealth: Payer: Self-pay | Admitting: Internal Medicine

## 2015-01-15 NOTE — Telephone Encounter (Signed)
Pt did not come in for their appt today for follow up. Please let me know if pt needs to be contacted immediately for follow up or no follow up needed. Best phone number to contact pt is [phone].

## 2015-01-16 NOTE — Telephone Encounter (Signed)
Please have her reschedule in the next few weeks

## 2015-01-27 ENCOUNTER — Encounter: Payer: Self-pay | Admitting: Internal Medicine

## 2015-01-27 NOTE — Telephone Encounter (Signed)
Attempted to call pt. No voicemail set up to leave message to call back and reschedule. Sent a letter to pt.

## 2015-01-28 ENCOUNTER — Emergency Department
Admission: EM | Admit: 2015-01-28 | Discharge: 2015-01-28 | Disposition: A | Discharge status: Home / Self Care | Payer: 59 | Attending: Emergency Medicine | Admitting: Emergency Medicine

## 2015-01-28 ENCOUNTER — Encounter: Payer: Self-pay | Admitting: Emergency Medicine

## 2015-01-28 ENCOUNTER — Emergency Department: Payer: 59

## 2015-01-28 ENCOUNTER — Telehealth: Payer: Self-pay | Admitting: Internal Medicine

## 2015-01-28 DIAGNOSIS — I10 Essential (primary) hypertension: Secondary | ICD-10-CM | POA: Insufficient documentation

## 2015-01-28 DIAGNOSIS — Z79899 Other long term (current) drug therapy: Secondary | ICD-10-CM | POA: Diagnosis not present

## 2015-01-28 DIAGNOSIS — R0789 Other chest pain: Secondary | ICD-10-CM | POA: Diagnosis not present

## 2015-01-28 DIAGNOSIS — E119 Type 2 diabetes mellitus without complications: Secondary | ICD-10-CM | POA: Insufficient documentation

## 2015-01-28 DIAGNOSIS — Z7982 Long term (current) use of aspirin: Secondary | ICD-10-CM | POA: Diagnosis not present

## 2015-01-28 DIAGNOSIS — R0602 Shortness of breath: Secondary | ICD-10-CM | POA: Insufficient documentation

## 2015-01-28 DIAGNOSIS — Z880 Allergy status to penicillin: Secondary | ICD-10-CM | POA: Diagnosis not present

## 2015-01-28 LAB — TROPONIN I: Troponin I: <0.03 ng/mL (ref <0.031)

## 2015-01-28 LAB — CBC
HEMATOCRIT: 40.6 % (ref 35.0–47.0)
Hemoglobin: 13.4 g/dL (ref 12.0–16.0)
MCH: 30.4 pg (ref 26.0–34.0)
MCHC: 32.9 g/dL (ref 32.0–36.0)
MCV: 92.4 fL (ref 80.0–100.0)
PLATELETS: 198 10*3/uL (ref 150–440)
RBC: 4.4 MIL/uL (ref 3.80–5.20)
RDW: 13 % (ref 11.5–14.5)
WBC: 5.7 10*3/uL (ref 3.6–11.0)

## 2015-01-28 LAB — BASIC METABOLIC PANEL
ANION GAP: 4 — AB (ref 5–15)
BUN: 21 mg/dL — AB (ref 6–20)
CO2: 31 mmol/L (ref 22–32)
CREATININE: 1.04 mg/dL — AB (ref 0.44–1.00)
Calcium: 9.4 mg/dL (ref 8.9–10.3)
Chloride: 107 mmol/L (ref 101–111)
GFR, EST NON AFRICAN AMERICAN: 58 mL/min — AB (ref >60)
Glucose, Bld: 99 mg/dL (ref 65–99)
Potassium: 4.5 mmol/L (ref 3.5–5.1)
Sodium: 142 mmol/L (ref 135–145)

## 2015-01-28 MED ORDER — ALBUTEROL SULFATE HFA 108 (90 BASE) MCG/ACT IN AERS: 2.0000 | INHALATION_SPRAY | Freq: Four times a day (QID) | RESPIRATORY_TRACT | Status: DC | PRN

## 2015-01-28 NOTE — Telephone Encounter (Signed)
Patient Name: Aram BeechamCYNTHIA Geist Gender: Female DOB: 04/24/1955 Age: [age] Y 1 M 8 D Return Phone Number: [phone] (Primary) Address: City/State/Zip: [place] [id number] Client [facility] - Client Client Site [facility] Day Physician Tillman AbideLetvak, Richard Contact Type Call Call Type Triage / Clinical Relationship To Patient Self Return Phone Number [phone] (Primary) Chief Complaint BREATHING - shortness of breath or sounds breathless Initial Comment Caller States feels like she cant catch her breath. PreDisposition Did not know what to do Nurse Assessment Nurse: Charna Elizabethrumbull, RN, Lynden Angathy Date/Time (Eastern Time): 01/28/2015 11:06:55 AM Confirm and document reason for call. If symptomatic, describe symptoms. ---Caller states she developed shortness of breath this morning. Alert and responsive. Has the patient traveled out of the country within the last 30 days? ---No Does the patient have any new or worsening symptoms? ---Yes Will a triage be completed? ---Yes Related visit to physician within the last 2 weeks? ---No Does the PT have any chronic conditions? (i.e. diabetes, asthma, etc.) ---Yes List chronic conditions. ---High Blood Pressure, Anxiety Is this a behavioral health or substance abuse call? ---No Guidelines Guideline Title Affirmed Question Affirmed Notes Nurse Date/Time (Eastern Time) Breathing Difficulty [1] MODERATE difficulty breathing (e.g., speaks in phrases, SOB even at rest, pulse 100-120) AND [2] NEWonset or WORSE than normal Charna Elizabethrumbull, RN, [facility] 01/28/2015 11:08:46 AM Disp. Time Lamount Cohen(Eastern Time) Disposition Final User 01/28/2015 11:05:06 AM Send to Urgent Queue Salvatore MarvelMiller, David 01/28/2015 11:10:31 AM Go to ED Now Yes Charna Elizabethrumbull, RN, Lynden Angathy She plans to have her daughter drive [facility]. She will call 911 if needed.

## 2015-01-28 NOTE — ED Provider Notes (Signed)
[facility] Emergency Department Provider Note   ____________________________________________  Time seen: 1515 I have reviewed the triage vital signs and the nursing notes.   HISTORY  Chief Complaint Shortness of Breath   History limited by: Not Limited   HPI Tami Jones is a 59 y.o. female who presents to the emergency department today because of concerns for shortness of breath. Patient states that the symptoms started last night. She felt a little bit of shortness of breath. This morning when she woke up she continued to have shortness of breath. She states that today she also developed some chest tightness. She denies any pain with taking deep breaths or with her shortness of breath. She denies any cough. Denies any fevers. Denies any recent abdominal pain nausea vomiting diarrhea or bloody stool.   Past Medical History  Diagnosis Date  . Diabetes mellitus   . GERD (gastroesophageal reflux disease)   . Hyperlipidemia   . CVA (cerebral vascular accident) (HCC) 03/2006    right occipital, Dr. Thad Ranger  . Hypertension   . Anxiety     Patient Active Problem List   Diagnosis Date Noted  . Dental infection 08/07/2014  . Cerebrovascular disease 07/17/2014  . Preventative health care 06/25/2014  . Right sided sciatica 03/08/2013  . MDD (major depressive disorder) (HCC) 09/27/2012  . MENOPAUSAL SYNDROME 09/10/2008  . Mood disorder (HCC) 09/15/2007  . Essential hypertension, benign 08/10/2006  . Diabetes mellitus type 2, controlled, without complications (HCC) 06/17/2006  . HYPERCHOLESTEROLEMIA 06/17/2006  . GERD 06/17/2006    Past Surgical History  Procedure Laterality Date  . Cesarean section    . Tonsillectomy and adenoidectomy    . Esophagogastroduodenoscopy  05/2005  . Cardiovascular stress test  1/15    myoview. EF 60%    Current Outpatient Rx  Name  Route  Sig  Dispense  Refill  . ALPRAZolam (XANAX) 0.25 MG tablet   Oral   Take 1  tablet (0.25 mg total) by mouth 2 (two) times daily as needed for anxiety.   60 tablet   0   . aspirin 81 MG tablet   Oral   Take 81 mg by mouth daily.           Marland Kitchen atorvastatin (LIPITOR) 20 MG tablet   Oral   Take 1 tablet (20 mg total) by mouth daily.   90 tablet   3   . cyclobenzaprine (FLEXERIL) 10 MG tablet   Oral   Take 1 tablet (10 mg total) by mouth 3 (three) times daily as needed for muscle spasms.   15 tablet   1   . DULoxetine (CYMBALTA) 30 MG capsule   Oral   Take 1 capsule (30 mg total) by mouth daily.   30 capsule   3   . hydrochlorothiazide (HYDRODIURIL) 25 MG tablet   Oral   Take 1 tablet (25 mg total) by mouth daily.   90 tablet   3   . ibuprofen (ADVIL,MOTRIN) 200 MG tablet   Oral   Take 200 mg by mouth every 6 (six) hours as needed.         . lidocaine (LIDODERM) 5 %   Transdermal   Place 1 patch onto the skin daily. Remove & Discard patch within 12 hours or as directed by MD         . lisinopril (PRINIVIL,ZESTRIL) 20 MG tablet   Oral   Take 40 mg by mouth daily.         Marland Kitchen  metoprolol succinate (TOPROL-XL) 100 MG 24 hr tablet   Oral   Take 1 tablet (100 mg total) by mouth daily. Take with or immediately following a meal.   90 tablet   3   . omeprazole (PRILOSEC) 20 MG capsule   Oral   Take 1 capsule (20 mg total) by mouth daily.   30 capsule   11   . ondansetron (ZOFRAN) 4 MG tablet   Oral   Take 1 tablet (4 mg total) by mouth daily as needed for nausea or vomiting.   10 tablet   1     Allergies Citalopram; Doxycycline hyclate; Erythromycin; Montelukast sodium; Nitrofurantoin; Sulfa antibiotics; Tramadol hcl; Venlafaxine hcl; and Amoxicillin-pot clavulanate  Family History  Problem Relation Age of Onset  . Coronary artery disease Mother   . Diabetes Mellitus II Mother   . Heart attack Mother 61  . Diabetes Mellitus II Maternal Grandmother     Social History Social History  Substance Use Topics  . Smoking status:  Never Smoker   . Smokeless tobacco: Never Used  . Alcohol Use: No     Comment: wine occassionally    Review of Systems  Constitutional: Negative for fever. Cardiovascular: Negative for chest pain. Positive for chest tightness Respiratory: Positive for shortness of breath. Gastrointestinal: Negative for abdominal pain, vomiting and diarrhea. Neurological: Negative for headaches, focal weakness or numbness. 10-point ROS otherwise negative.  ____________________________________________   PHYSICAL EXAM:  VITAL SIGNS: ED Triage Vitals  Enc Vitals Group     BP 01/28/15 1221 180/80 mmHg     Pulse Rate 01/28/15 1218 63     Resp 01/28/15 1218 18     Temp 01/28/15 1218 97.9 F (36.6 C)     Temp Source 01/28/15 1218 Oral     SpO2 01/28/15 1218 100 %     Weight 01/28/15 1218 197 lb (89.359 kg)     Height 01/28/15 1218  (1.626 m)     Head Cir --      Peak Flow --      Pain Score 01/28/15 1219 5   Constitutional: Alert and oriented. Well appearing and in no distress. Eyes: Conjunctivae are normal. PERRL. Normal extraocular movements. ENT   Head: Normocephalic and atraumatic.   Nose: No congestion/rhinnorhea.   Mouth/Throat: Mucous membranes are moist.   Neck: No stridor. Hematological/Lymphatic/Immunilogical: No cervical lymphadenopathy. Cardiovascular: Normal rate, regular rhythm.  No murmurs, rubs, or gallops. Respiratory: Normal respiratory effort without tachypnea nor retractions. Breath sounds are clear and equal bilaterally. No wheezes/rales/rhonchi. Gastrointestinal: Soft and nontender. No distention. Genitourinary: Deferred Musculoskeletal: Normal range of motion in all extremities. No joint effusions.   Neurologic:  Normal speech and language. No gross focal neurologic deficits are appreciated.  Skin:  Skin is warm, dry and intact. No rash noted. Psychiatric: Mood and affect are normal. Speech and behavior are normal. Patient exhibits appropriate insight  and judgment.  ____________________________________________    LABS (pertinent positives/negatives)  Labs Reviewed  BASIC METABOLIC PANEL - Abnormal; Notable for the following:    BUN 21 (*)    Creatinine, Ser 1.04 (*)    GFR calc non Af Amer 58 (*)    Anion gap 4 (*)    All other components within normal limits  CBC  TROPONIN I     ____________________________________________   EKG  I, Phineas Semen, attending physician, personally viewed and interpreted this EKG  EKG Time: 1223 Rate: 63 Rhythm: NSR Axis: normal Intervals: qtc 405 QRS: narrow ST  changes: no st elevation Impression: normal ekg ____________________________________________    RADIOLOGY  CXR IMPRESSION: Negative chest.  ____________________________________________   PROCEDURES  Procedure(s) performed: None  Critical Care performed: No  ____________________________________________   INITIAL IMPRESSION / ASSESSMENT AND PLAN / ED COURSE  Pertinent labs & imaging results that were available during my care of the patient were reviewed by me and considered in my medical decision making (see chart for details).  Patient presents to the emergency department today with concerns for shortness of breath and chest tightness. Blood work here without concerning findings. X-ray without any signs of pneumonia, pneumothorax or other concerning findings. Patient certainly in no respiratory distress on my exam. Able to talk in full sentences easily. Think at this point possible patient has some reactive airway disease. Will try patient on albuterol. Discussed return precautions with patient.  ____________________________________________   FINAL CLINICAL IMPRESSION(S) / ED DIAGNOSES  Final diagnoses:  Shortness of breath     Phineas SemenGraydon Eain Mullendore, MD 01/28/15 1524

## 2015-01-28 NOTE — Telephone Encounter (Signed)
Sounds like it may be another of her panic spells. Checked into ER without any distress Will await their full evaluation

## 2015-01-28 NOTE — Discharge Instructions (Signed)
Please seek medical attention for any high fevers, chest pain, shortness of breath, change in behavior, persistent vomiting, bloody stool or any other new or concerning symptoms. ° ° °Shortness of Breath °Shortness of breath means you have trouble breathing. It could also mean that you have a medical problem. You should get immediate medical care for shortness of breath. °CAUSES  °· Not enough oxygen in the air such as with high altitudes or a smoke-filled room. °· Certain lung diseases, infections, or problems. °· Heart disease or conditions, such as angina or heart failure. °· Low red blood cells (anemia). °· Poor physical fitness, which can cause shortness of breath when you exercise. °· Chest or back injuries or stiffness. °· Being overweight. °· Smoking. °· Anxiety, which can make you feel like you are not getting enough air. °DIAGNOSIS  °Serious medical problems can often be found during your physical exam. Tests may also be done to determine why you are having shortness of breath. Tests may include: °· Chest X-rays. °· Lung function tests. °· Blood tests. °· An electrocardiogram (ECG). °· An ambulatory electrocardiogram. An ambulatory ECG records your heartbeat patterns over a 24-hour period. °· Exercise testing. °· A transthoracic echocardiogram (TTE). During echocardiography, sound waves are used to evaluate how blood flows through your heart. °· A transesophageal echocardiogram (TEE). °· Imaging scans. °Your health care provider may not be able to find a cause for your shortness of breath after your exam. In this case, it is important to have a follow-up exam with your health care provider as directed.  °TREATMENT  °Treatment for shortness of breath depends on the cause of your symptoms and can vary greatly. °HOME CARE INSTRUCTIONS  °· Do not smoke. Smoking is a common cause of shortness of breath. If you smoke, ask for help to quit. °· Avoid being around chemicals or things that may bother your breathing,  such as paint fumes and dust. °· Rest as needed. Slowly resume your usual activities. °· If medicines were prescribed, take them as directed for the full length of time directed. This includes oxygen and any inhaled medicines. °· Keep all follow-up appointments as directed by your health care provider. °SEEK MEDICAL CARE IF:  °· Your condition does not improve in the time expected. °· You have a hard time doing your normal activities even with rest. °· You have any new symptoms. °SEEK IMMEDIATE MEDICAL CARE IF:  °· Your shortness of breath gets worse. °· You feel light-headed, faint, or develop a cough not controlled with medicines. °· You start coughing up blood. °· You have pain with breathing. °· You have chest pain or pain in your arms, shoulders, or abdomen. °· You have a fever. °· You are unable to walk up stairs or exercise the way you normally do. °MAKE SURE YOU: °· Understand these instructions. °· Will watch your condition. °· Will get help right away if you are not doing well or get worse. °  °This information is not intended to replace advice given to you by your health care provider. Make sure you discuss any questions you have with your health care provider. °  °Document Released: 10/27/2000 Document Revised: 02/06/2013 Document Reviewed: 04/19/2011 °Elsevier Interactive Patient Education ©2016 Else[employer] Inc. ° °

## 2015-01-28 NOTE — ED Notes (Signed)
Pt states sob today, pt talking like she is out of breath, however, appears in no distress, O2 100% RA.

## 2015-01-28 NOTE — ED Notes (Signed)
Pt alert and oriented X4, active, cooperative, pt in NAD. RR even and unlabored, color WNL.  Pt informed to return if any life threatening symptoms occur.   

## 2015-01-28 NOTE — ED Notes (Signed)
Pt states "my chest feels tight."

## 2015-01-28 NOTE — Telephone Encounter (Signed)
Per chart review pt is at [facility] ED now. 

## 2015-02-04 ENCOUNTER — Ambulatory Visit (INDEPENDENT_AMBULATORY_CARE_PROVIDER_SITE_OTHER): Payer: 59 | Admitting: Internal Medicine

## 2015-02-04 ENCOUNTER — Encounter: Payer: Self-pay | Admitting: Internal Medicine

## 2015-02-04 ENCOUNTER — Other Ambulatory Visit: Payer: Self-pay | Admitting: Internal Medicine

## 2015-02-04 VITALS — BP 120/80 | HR 67 | Temp 98.0°F | Wt 222.0 lb

## 2015-02-04 DIAGNOSIS — F39 Unspecified mood [affective] disorder: Secondary | ICD-10-CM

## 2015-02-04 MED ORDER — CLONAZEPAM 0.5 MG PO TABS: 0.5000 mg | ORAL_TABLET | Freq: Two times a day (BID) | ORAL | Status: DC

## 2015-02-04 NOTE — Telephone Encounter (Signed)
12/11/2014 

## 2015-02-04 NOTE — Telephone Encounter (Signed)
rx called into pharmacy

## 2015-02-04 NOTE — Telephone Encounter (Signed)
Pt did not pick up clonazepam today due to cost of $35.00; pt is going to ck with her ins co to see if that is the correct price and pt wanted to verify alprazolam was called to pharmacy.advised pt called in today at 2:45. Pt voiced understanding.

## 2015-02-04 NOTE — Progress Notes (Signed)
Pre visit review using our clinic review tool, if applicable. No additional management support is needed unless otherwise documented below in the visit note. 

## 2015-02-04 NOTE — Patient Instructions (Signed)
Please start the clonazepam twice a day. Only use the alprazolam if you are having a panic spell

## 2015-02-04 NOTE — Progress Notes (Signed)
Subjective:    Patient ID: Tami Jones, female    DOB: 04/26/1955, 59 y.o.   MRN: [id number]  HPI Here for follow up of HTN and anxiety  Back to ER---woke up and couldn't breathe Went to work but sent home No findings and felt better while there Alprazolam didn't help her symptoms when they first came on  Stopped the duloxetine Didn't like how she felt Still has anxiety especially at night Tries deep breathing, etc but often not effective Usually the alprazolam helps Using this regularly--twice a day  Current Outpatient Prescriptions on File Prior to Visit  Medication Sig Dispense Refill  . albuterol (PROVENTIL HFA;VENTOLIN HFA) 108 (90 BASE) MCG/ACT inhaler Inhale 2 puffs into the lungs every 6 (six) hours as needed for wheezing or shortness of breath. 1 Inhaler 0  . ALPRAZolam (XANAX) 0.25 MG tablet Take 1 tablet (0.25 mg total) by mouth 2 (two) times daily as needed for anxiety. 60 tablet 0  . aspirin 81 MG tablet Take 81 mg by mouth daily.      Marland Kitchen. atorvastatin (LIPITOR) 20 MG tablet Take 1 tablet (20 mg total) by mouth daily. 90 tablet 3  . cyclobenzaprine (FLEXERIL) 10 MG tablet Take 1 tablet (10 mg total) by mouth 3 (three) times daily as needed for muscle spasms. 15 tablet 1  . hydrochlorothiazide (HYDRODIURIL) 25 MG tablet Take 1 tablet (25 mg total) by mouth daily. 90 tablet 3  . lisinopril (PRINIVIL,ZESTRIL) 20 MG tablet Take 40 mg by mouth daily.    . metoprolol succinate (TOPROL-XL) 100 MG 24 hr tablet Take 1 tablet (100 mg total) by mouth daily. Take with or immediately following a meal. 90 tablet 3  . omeprazole (PRILOSEC) 20 MG capsule Take 1 capsule (20 mg total) by mouth daily. 30 capsule 11   No current facility-administered medications on file prior to visit.    Allergies  Allergen Reactions  . Citalopram Other (See Comments)    intolerant  . Doxycycline Hyclate Other (See Comments)    REACTION: stomach upset  . Erythromycin   . Montelukast Sodium Other  (See Comments)    REACTION: unspecified  . Nitrofurantoin Other (See Comments)    REACTION: unspecified  . Sulfa Antibiotics Nausea Only  . Tramadol Hcl Other (See Comments)    REACTION: Can not tolerate  . Venlafaxine Hcl Other (See Comments)    Intolerant  . Amoxicillin-Pot Clavulanate Nausea Only    Past Medical History  Diagnosis Date  . Diabetes mellitus   . GERD (gastroesophageal reflux disease)   . Hyperlipidemia   . CVA (cerebral vascular accident) (HCC) 03/2006    right occipital, Dr. Thad Rangereynolds  . Hypertension   . Anxiety     Past Surgical History  Procedure Laterality Date  . Cesarean section    . Tonsillectomy and adenoidectomy    . Esophagogastroduodenoscopy  05/2005  . Cardiovascular stress test  1/15    myoview. EF 60%    Family History  Problem Relation Age of Onset  . Coronary artery disease Mother   . Diabetes Mellitus II Mother   . Heart attack Mother [id number]  . Diabetes Mellitus II Maternal Grandmother     Social History   Social History  . Marital Status: Divorced    Spouse Name: N/A  . Number of Children: 2  . Years of Education: N/A   Occupational History  . LAB [employer]    Social History Main Topics  . Smoking status: Never Smoker   .  Smokeless tobacco: Never Used  . Alcohol Use: No     Comment: wine occassionally  . Drug Use: No  . Sexual Activity: Yes    Birth Control/ Protection: Condom   Other Topics Concern  . Not on file   Social History Narrative   Review of Systems  Appetite is okay Trying to get to gym--not very successful Weight up a few pounds     Objective:   Physical Exam  Constitutional: She appears well-developed and well-nourished. No distress.  Neck: Normal range of motion. Neck supple. No thyromegaly present.  Cardiovascular: Normal rate, regular rhythm and normal heart sounds.  Exam reveals no gallop.   No murmur heard. Pulmonary/Chest: Effort normal and breath sounds normal. No respiratory distress. She has  no wheezes. She has no rales.  Musculoskeletal: She exhibits no edema.  Lymphadenopathy:    She has no cervical adenopathy.  Psychiatric:  Calm now Normal appearance and speech          Assessment & Plan:

## 2015-02-04 NOTE — Telephone Encounter (Signed)
Approved: #30 x 0 She is going to use the clonazepam regularly now so lower #

## 2015-02-04 NOTE — Assessment & Plan Note (Signed)
Anxiety/panic Depression not as evident Failed another med--duloxetine Will change to clonazepam for long symptom relief

## 2015-02-06 ENCOUNTER — Telehealth: Payer: Self-pay | Admitting: Internal Medicine

## 2015-02-06 NOTE — Telephone Encounter (Signed)
Okay I didn't think it should be more expensive than the alprazolam

## 2015-02-06 NOTE — Telephone Encounter (Signed)
Patient said she's going to take Klonapen.  Patient said she thought it was going to cost too much, but she found out she can afford the Klonapen.  Patient said she's going to switch to [facility] the beginning of the year because she's not having a good outcome with [facility].

## 2015-02-19 ENCOUNTER — Telehealth: Payer: Self-pay | Admitting: Internal Medicine

## 2015-02-19 NOTE — Telephone Encounter (Signed)
fmla faxed  Pt called she stated it was same as last time She also wanted it to cover when she was late for work Pt stated she is having a hard time sleeping  In dr Karle Starchletvak's IN BOX For review and signature

## 2015-02-25 DIAGNOSIS — Z7689 Persons encountering health services in other specified circumstances: Secondary | ICD-10-CM

## 2015-02-25 NOTE — Telephone Encounter (Signed)
Copy faxed to [facility] Tried call pt mail box full  Copy for scan Copy for billing Copy for pt Copy for file

## 2015-02-25 NOTE — Telephone Encounter (Signed)
Form done-I addended some things $20 Okay for 1-2 days per month as needed---- I don't think excusing lateness is appropriate for her

## 2015-02-28 ENCOUNTER — Other Ambulatory Visit: Payer: Self-pay | Admitting: Internal Medicine

## 2015-03-03 ENCOUNTER — Other Ambulatory Visit: Payer: Self-pay | Admitting: Internal Medicine

## 2015-03-03 NOTE — Telephone Encounter (Signed)
rx called into pharmacy

## 2015-03-03 NOTE — Telephone Encounter (Signed)
Approved: 30 x 0 

## 2015-03-03 NOTE — Telephone Encounter (Signed)
02/04/2015 

## 2015-03-05 NOTE — Telephone Encounter (Signed)
Pt aware.

## 2015-03-06 ENCOUNTER — Ambulatory Visit (INDEPENDENT_AMBULATORY_CARE_PROVIDER_SITE_OTHER): Payer: 59 | Admitting: Internal Medicine

## 2015-03-06 ENCOUNTER — Telehealth: Payer: Self-pay | Admitting: Internal Medicine

## 2015-03-06 ENCOUNTER — Encounter: Payer: Self-pay | Admitting: Internal Medicine

## 2015-03-06 VITALS — BP 132/90 | HR 64 | Temp 98.1°F | Wt 226.5 lb

## 2015-03-06 DIAGNOSIS — F39 Unspecified mood [affective] disorder: Secondary | ICD-10-CM

## 2015-03-06 DIAGNOSIS — R42 Dizziness and giddiness: Secondary | ICD-10-CM

## 2015-03-06 MED ORDER — ALPRAZOLAM 0.25 MG PO TABS: 0.2500 mg | ORAL_TABLET | Freq: Two times a day (BID) | ORAL | Status: DC | PRN

## 2015-03-06 MED ORDER — CLONAZEPAM 0.5 MG PO TABS: 0.5000 mg | ORAL_TABLET | Freq: Every day | ORAL | Status: DC

## 2015-03-06 NOTE — Progress Notes (Signed)
Subjective:    Patient ID: Tami Jones, female    DOB: 1955-05-29, 60 y.o.   MRN: [id number]  HPI Yesterday she got "so dizzy and lightheaded" she had to leave work [employer] today but not as bad--did go to work [employer] her vision is off-- slightly blurry. No focal vision loss Slight headache (behind eyes) this morning Still feels very jittery today  No chest pain No SOB  Clonazepam is "okay" Feels that it calms her--doesn't notice it wearing off. It does make her sleepy Ran out of alprazolam--only needed it once extra in past month (but ran out as she was taking every morning till a week ago)  Current Outpatient Prescriptions on File Prior to Visit  Medication Sig Dispense Refill  . albuterol (PROVENTIL HFA;VENTOLIN HFA) 108 (90 BASE) MCG/ACT inhaler Inhale 2 puffs into the lungs every 6 (six) hours as needed for wheezing or shortness of breath. 1 Inhaler 0  . aspirin 81 MG tablet Take 81 mg by mouth daily.      Marland Kitchen atorvastatin (LIPITOR) 20 MG tablet Take 1 tablet (20 mg total) by mouth daily. 90 tablet 3  . clonazePAM (KLONOPIN) 0.5 MG tablet Take 1 tablet (0.5 mg total) by mouth 2 (two) times daily. 60 tablet 1  . cyclobenzaprine (FLEXERIL) 10 MG tablet Take 1 tablet (10 mg total) by mouth 3 (three) times daily as needed for muscle spasms. 15 tablet 1  . hydrochlorothiazide (HYDRODIURIL) 25 MG tablet Take 1 tablet (25 mg total) by mouth daily. 90 tablet 3  . lisinopril (PRINIVIL,ZESTRIL) 20 MG tablet Take 40 mg by mouth daily.    . metoprolol succinate (TOPROL-XL) 100 MG 24 hr tablet Take 1 tablet (100 mg total) by mouth daily. Take with or immediately following a meal. 90 tablet 3  . omeprazole (PRILOSEC) 20 MG capsule Take 1 capsule (20 mg total) by mouth daily. 30 capsule 11   No current facility-administered medications on file prior to visit.    Allergies  Allergen Reactions  . Citalopram Other (See Comments)    intolerant  . Doxycycline Hyclate Other (See Comments)   REACTION: stomach upset  . Erythromycin   . Montelukast Sodium Other (See Comments)    REACTION: unspecified  . Nitrofurantoin Other (See Comments)    REACTION: unspecified  . Sulfa Antibiotics Nausea Only  . Tramadol Hcl Other (See Comments)    REACTION: Can not tolerate  . Venlafaxine Hcl Other (See Comments)    Intolerant  . Amoxicillin-Pot Clavulanate Nausea Only    Past Medical History  Diagnosis Date  . Diabetes mellitus   . GERD (gastroesophageal reflux disease)   . Hyperlipidemia   . CVA (cerebral vascular accident) (HCC) 03/2006    right occipital, Dr. Thad Ranger  . Hypertension   . Anxiety     Past Surgical History  Procedure Laterality Date  . Cesarean section    . Tonsillectomy and adenoidectomy    . Esophagogastroduodenoscopy  05/2005  . Cardiovascular stress test  1/15    myoview. EF 60%    Family History  Problem Relation Age of Onset  . Coronary artery disease Mother   . Diabetes Mellitus II Mother   . Heart attack Mother 70  . Diabetes Mellitus II Maternal Grandmother     Social History   Social History  . Marital Status: Divorced    Spouse Name: N/A  . Number of Children: 2  . Years of Education: N/A   Occupational History  . LAB TECH  Social History Main Topics  . Smoking status: Never Smoker   . Smokeless tobacco: Never Used  . Alcohol Use: No     Comment: wine occassionally  . Drug Use: No  . Sexual Activity: Yes    Birth Control/ Protection: Condom   Other Topics Concern  . Not on file   Social History Narrative   Review of Systems Appetite off today--but otherwise okay Still not sleeping well    Objective:   Physical Exam  Constitutional: She appears well-nourished. No distress.  Neck: Normal range of motion. Neck supple. No thyromegaly present.  Cardiovascular: Normal rate, regular rhythm and normal heart sounds.  Exam reveals no gallop.   No murmur heard. Pulmonary/Chest: Effort normal and breath sounds normal. No  respiratory distress. She has no wheezes. She has no rales.  Musculoskeletal: She exhibits no edema.  Lymphadenopathy:    She has no cervical adenopathy.          Assessment & Plan:

## 2015-03-06 NOTE — Telephone Encounter (Signed)
Tried to contact pt at contact # [phone].The mail box is full and cannot leave a message. Called pt at work #. Pt has been light headed since 03/05/15; light headedness comes and goes. Slight H/A when woke up but no h/a now. Head does not feel "like is supposed to; head feels uncomfortable due to lightheadedness". No blurred vision, CP or SOB.  Pt has appt on 03/06/15 at 2:30 with Dr Alphonsus Sias and pt said she thinks she can wait until then to be seen; pts daughter will drive her to the appt. If condition changes or worsens prior to appt pt will call [facility] or go to [facility]. Advised pt to drink clear liquids and if lightheadedness reoccurs can try laying down with feet elevated. Pt said she is borderline diabetic but her BS has been fine.

## 2015-03-06 NOTE — Telephone Encounter (Signed)
Will evaluate her at the OV Advice was appropriate

## 2015-03-06 NOTE — Telephone Encounter (Signed)
Spoke with susan @ [facility] They need part C question 3 Incapacity  Start date 01/30/15 end date 07/09/15 And part 3b office visits State date 01/30/15 end date 07/09/15 She also said they need 3bii filed out also  In Dr Vassie Moselle IN box For changes

## 2015-03-06 NOTE — Progress Notes (Signed)
Pre visit review using our clinic review tool, if applicable. No additional management support is needed unless otherwise documented below in the visit note. 

## 2015-03-06 NOTE — Telephone Encounter (Signed)
This needs to be faxed no later than 03/07/15

## 2015-03-06 NOTE — Telephone Encounter (Signed)
Form addended No charge 

## 2015-03-06 NOTE — Assessment & Plan Note (Signed)
Intolerant of multiple meds Forced to use regular benzo regimen Clonazepam helps sleep but she can't take it in day Will use this at bedtime only, alprazolam for daytime

## 2015-03-06 NOTE — Telephone Encounter (Signed)
Patient Name: Tami Jones DOB: 10-06-1955 Initial Comment Caller states has appt today at 2:30, but feeling light headed and had to leave work yesterday Nurse Assessment Guidelines Guideline Title Affirmed Question Affirmed Notes Final Disposition User Comments Voicemail full and not accepting messages. Unable to leave a voice message. Unable to reach caller after 3 attempts.

## 2015-03-06 NOTE — Assessment & Plan Note (Signed)
And jitteriness Different than in the past but still seems to be related to her anxiety

## 2015-03-06 NOTE — Telephone Encounter (Signed)
Form faxed Tried calling pt mail box full could not leave message

## 2015-03-20 ENCOUNTER — Encounter: Payer: Self-pay | Admitting: Internal Medicine

## 2015-03-20 ENCOUNTER — Ambulatory Visit (INDEPENDENT_AMBULATORY_CARE_PROVIDER_SITE_OTHER): Payer: 59 | Admitting: Internal Medicine

## 2015-03-20 VITALS — BP 120/80 | HR 75 | Temp 98.6°F | Resp 18 | Wt 222.0 lb

## 2015-03-20 DIAGNOSIS — B349 Viral infection, unspecified: Secondary | ICD-10-CM | POA: Diagnosis not present

## 2015-03-20 NOTE — Progress Notes (Signed)
Pre visit review using our clinic review tool, if applicable. No additional management support is needed unless otherwise documented below in the visit note. 

## 2015-03-20 NOTE — Progress Notes (Signed)
Subjective:    Patient ID: Tami Jones, female    DOB: 09/11/1955, 60 y.o.   MRN: [id number]  HPI Here due to cough  In bed for several days (past 3 days) Cough and chest pain and body aches Nausea and some vomiting --thinks phlegm getting in there causing the nausea Fever the first day Some SOB and wheezing--inhaler not really helping Some sweating at night--gets hot. No chills Cough is mostly dry Some headache Some sore throat but no otalgia  Took amoxicillin 2 days ago--left over (but didn't continue) Using tylenol --no clear help  Current Outpatient Prescriptions on File Prior to Visit  Medication Sig Dispense Refill  . albuterol (PROVENTIL HFA;VENTOLIN HFA) 108 (90 BASE) MCG/ACT inhaler Inhale 2 puffs into the lungs every 6 (six) hours as needed for wheezing or shortness of breath. 1 Inhaler 0  . ALPRAZolam (XANAX) 0.25 MG tablet Take 1 tablet (0.25 mg total) by mouth 2 (two) times daily as needed. 60 tablet 0  . aspirin 81 MG tablet Take 81 mg by mouth daily.      Marland Kitchen atorvastatin (LIPITOR) 20 MG tablet Take 1 tablet (20 mg total) by mouth daily. 90 tablet 3  . clonazePAM (KLONOPIN) 0.5 MG tablet Take 1 tablet (0.5 mg total) by mouth at bedtime. 30 tablet 0  . cyclobenzaprine (FLEXERIL) 10 MG tablet Take 1 tablet (10 mg total) by mouth 3 (three) times daily as needed for muscle spasms. 15 tablet 1  . hydrochlorothiazide (HYDRODIURIL) 25 MG tablet Take 1 tablet (25 mg total) by mouth daily. 90 tablet 3  . lisinopril (PRINIVIL,ZESTRIL) 20 MG tablet Take 40 mg by mouth daily.    . metoprolol succinate (TOPROL-XL) 100 MG 24 hr tablet Take 1 tablet (100 mg total) by mouth daily. Take with or immediately following a meal. 90 tablet 3  . omeprazole (PRILOSEC) 20 MG capsule Take 1 capsule (20 mg total) by mouth daily. 30 capsule 11   No current facility-administered medications on file prior to visit.    Allergies  Allergen Reactions  . Citalopram Other (See Comments)   intolerant  . Doxycycline Hyclate Other (See Comments)    REACTION: stomach upset  . Erythromycin   . Montelukast Sodium Other (See Comments)    REACTION: unspecified  . Nitrofurantoin Other (See Comments)    REACTION: unspecified  . Sulfa Antibiotics Nausea Only  . Tramadol Hcl Other (See Comments)    REACTION: Can not tolerate  . Venlafaxine Hcl Other (See Comments)    Intolerant  . Amoxicillin-Pot Clavulanate Nausea Only    Past Medical History  Diagnosis Date  . Diabetes mellitus   . GERD (gastroesophageal reflux disease)   . Hyperlipidemia   . CVA (cerebral vascular accident) (HCC) 03/2006    right occipital, Dr. Thad Ranger  . Hypertension   . Anxiety     Past Surgical History  Procedure Laterality Date  . Cesarean section    . Tonsillectomy and adenoidectomy    . Esophagogastroduodenoscopy  05/2005  . Cardiovascular stress test  1/15    myoview. EF 60%    Family History  Problem Relation Age of Onset  . Coronary artery disease Mother   . Diabetes Mellitus II Mother   . Heart attack Mother 70  . Diabetes Mellitus II Maternal Grandmother     Social History   Social History  . Marital Status: Divorced    Spouse Name: N/A  . Number of Children: 2  . Years of Education: N/A  Occupational History  . LAB [employer]    Social History Main Topics  . Smoking status: Never Smoker   . Smokeless tobacco: Never Used  . Alcohol Use: No     Comment: wine occassionally  . Drug Use: No  . Sexual Activity: Yes    Birth Control/ Protection: Condom   Other Topics Concern  . Not on file   Social History Narrative   Review of Systems  No rash Indicates abdominal pain vaguely across entire upper abdomen 1 loose stool this AM     Objective:   Physical Exam  Constitutional: She appears well-developed.  Looks under the weather but not distressed  HENT:  Mouth/Throat: Oropharynx is clear and moist. No oropharyngeal exudate.  No sinus tenderness TMs  normal Moderate nasal inflammation  Neck: Normal range of motion. Neck supple.  Pulmonary/Chest: Effort normal and breath sounds normal. No respiratory distress. She has no wheezes. She has no rales.  Lymphadenopathy:    She has no cervical adenopathy.          Assessment & Plan:

## 2015-03-20 NOTE — Assessment & Plan Note (Signed)
Flu like illness--may actually be attentuated flu Discussed analgesics Has FMLA for her work leave---hopefully should be able to get back by 2/6

## 2015-03-21 ENCOUNTER — Telehealth: Payer: Self-pay | Admitting: Internal Medicine

## 2015-03-21 NOTE — Telephone Encounter (Signed)
FMLA paperwork  In Dr Karle Starch In box  For review and signature

## 2015-03-21 NOTE — Telephone Encounter (Signed)
Pt called stating fmla paperwork will be faxed  Pt wanted paperwork just like last one that was done But changed part 3 A to  2-4  Days every 30 days

## 2015-03-24 NOTE — Telephone Encounter (Signed)
Form finished and addended (missed last week due to flu like illness)

## 2015-03-24 NOTE — Telephone Encounter (Signed)
Tried calling pt mail box full Paperwork faxed to [employer] Copy for scan Copy for billing Copy for pt

## 2015-03-26 ENCOUNTER — Other Ambulatory Visit: Payer: Self-pay | Admitting: *Deleted

## 2015-03-26 MED ORDER — CLONAZEPAM 0.5 MG PO TABS: 0.5000 mg | ORAL_TABLET | Freq: Every day | ORAL | Status: DC

## 2015-03-26 MED ORDER — ALPRAZOLAM 0.25 MG PO TABS
0.2500 mg | ORAL_TABLET | Freq: Two times a day (BID) | ORAL | Status: DC | PRN
Start: 2015-03-26 — End: 2015-07-15

## 2015-03-26 MED ORDER — LISINOPRIL 20 MG PO TABS: 40.0000 mg | ORAL_TABLET | Freq: Every day | ORAL | Status: DC

## 2015-03-26 NOTE — Telephone Encounter (Signed)
Faxed request from [facility] asking for 90 day supply  Xanax and clonazepam was last filled 03/06/2015 sent to [facility]. Please advise

## 2015-03-26 NOTE — Telephone Encounter (Signed)
I verbally approved Rx for her new regimen Rx signed

## 2015-03-26 NOTE — Telephone Encounter (Signed)
Verbal ok by Dr. Alphonsus Sias rx sent to pharmacy by e-script

## 2015-03-27 NOTE — Telephone Encounter (Signed)
Pt aware.

## 2015-04-11 ENCOUNTER — Telehealth: Payer: Self-pay | Admitting: Internal Medicine

## 2015-04-11 NOTE — Telephone Encounter (Signed)
Ok to wait until 3/1 unless worsens

## 2015-04-11 NOTE — Telephone Encounter (Signed)
Patient Name: Tami Jones  DOB: [birth year]    Initial Comment Caller states having blurred vision    Nurse Assessment  Nurse: Stefano Gaul, RN, Vera Date/Time (Eastern Time): 04/11/2015 12:23:35 PM  Confirm and document reason for call. If symptomatic, describe symptoms. You must click the next button to save text entered. ---Caller states she had blurry vision earlier today but not now. Her left eye had some blurry vision. No pain.  Has the patient traveled out of the country within the last 30 days? ---Not Applicable  Does the patient have any new or worsening symptoms? ---Yes  Will a triage be completed? ---Yes  Related visit to physician within the last 2 weeks? ---No  Does the PT have any chronic conditions? (i.e. diabetes, asthma, etc.) ---Yes  List chronic conditions. ---CVA; borderline diabetes  Is this a behavioral health or substance abuse call? ---No     Guidelines    Guideline Title Affirmed Question Affirmed Notes  Vision Loss or Change [1] Brief (now gone) blurred vision AND [2] unexplained    Final Disposition User   See PCP When Office is Open (within 3 days) Stefano Gaul, Charity fundraiser, Dwana Curd    Comments  Attempted to return call and recording states that the mailbox is full and can not accept any more messages. Unable to leave message. Will try call again later.  Attempted to return call and recording states that the mailbox is full and can not accept any more messages and to try call again later. Unable to leave message. Will try call again later.  Pt states she already has an appt on Wednesday next week and does not want to make another one.   Disagree/Comply: Disagree  Disagree/Comply Reason: Disagree with instructions

## 2015-04-11 NOTE — Telephone Encounter (Signed)
Pt has appt on 04/16/15 with Dr Alphonsus Sias; Dr Alphonsus Sias out of office and per [facility] not on computer; will send to Pamala Hurry NP and FYI to Dr Alphonsus Sias.

## 2015-04-12 NOTE — Telephone Encounter (Signed)
Please check on her Monday morning and add on Monday if indicated

## 2015-04-14 NOTE — Telephone Encounter (Signed)
Tried calling pt, her mailbox was full and couldn't leave a message, will try again later.

## 2015-04-16 ENCOUNTER — Ambulatory Visit (INDEPENDENT_AMBULATORY_CARE_PROVIDER_SITE_OTHER): Payer: 59 | Admitting: Internal Medicine

## 2015-04-16 ENCOUNTER — Encounter: Payer: Self-pay | Admitting: Internal Medicine

## 2015-04-16 VITALS — BP 140/80 | HR 65 | Temp 97.7°F | Wt 224.0 lb

## 2015-04-16 DIAGNOSIS — H539 Unspecified visual disturbance: Secondary | ICD-10-CM | POA: Diagnosis not present

## 2015-04-16 LAB — HM DIABETES EYE EXAM

## 2015-04-16 NOTE — Progress Notes (Signed)
Subjective:    Patient ID: Tami Jones, female    DOB: 1955/11/04, 60 y.o.   MRN: [id number]  HPI Here due to vision symptoms 2 episodes of "blur" in left eye in past 2 days Lasted all day first time---couldn't focus Happened again yesterday--- lasted a long time Felt her central vision was blurred but peripheral was better No flashing lights or floaters  Then frontal headache yesterday --left work No history of migraines Pounding type Gets light sensitivity frequently  "my whole body feels tired" but no focal weakness No aphasia or dysphagia. Did think she was stuttering some yesterday No facial droop  Current Outpatient Prescriptions on File Prior to Visit  Medication Sig Dispense Refill  . albuterol (PROVENTIL HFA;VENTOLIN HFA) 108 (90 BASE) MCG/ACT inhaler Inhale 2 puffs into the lungs every 6 (six) hours as needed for wheezing or shortness of breath. 1 Inhaler 0  . ALPRAZolam (XANAX) 0.25 MG tablet Take 1 tablet (0.25 mg total) by mouth 2 (two) times daily as needed. 180 tablet 0  . aspirin 81 MG tablet Take 81 mg by mouth daily.      Marland Kitchen atorvastatin (LIPITOR) 20 MG tablet Take 1 tablet (20 mg total) by mouth daily. 90 tablet 3  . clonazePAM (KLONOPIN) 0.5 MG tablet Take 1 tablet (0.5 mg total) by mouth at bedtime. 90 tablet 0  . cyclobenzaprine (FLEXERIL) 10 MG tablet Take 1 tablet (10 mg total) by mouth 3 (three) times daily as needed for muscle spasms. 15 tablet 1  . hydrochlorothiazide (HYDRODIURIL) 25 MG tablet Take 1 tablet (25 mg total) by mouth daily. 90 tablet 3  . lisinopril (PRINIVIL,ZESTRIL) 20 MG tablet Take 2 tablets (40 mg total) by mouth daily. 180 tablet 3  . metoprolol succinate (TOPROL-XL) 100 MG 24 hr tablet Take 1 tablet (100 mg total) by mouth daily. Take with or immediately following a meal. 90 tablet 3  . omeprazole (PRILOSEC) 20 MG capsule Take 1 capsule (20 mg total) by mouth daily. 30 capsule 11   No current facility-administered medications on  file prior to visit.    Allergies  Allergen Reactions  . Citalopram Other (See Comments)    intolerant  . Doxycycline Hyclate Other (See Comments)    REACTION: stomach upset  . Erythromycin   . Montelukast Sodium Other (See Comments)    REACTION: unspecified  . Nitrofurantoin Other (See Comments)    REACTION: unspecified  . Sulfa Antibiotics Nausea Only  . Tramadol Hcl Other (See Comments)    REACTION: Can not tolerate  . Venlafaxine Hcl Other (See Comments)    Intolerant  . Amoxicillin-Pot Clavulanate Nausea Only    Past Medical History  Diagnosis Date  . Diabetes mellitus   . GERD (gastroesophageal reflux disease)   . Hyperlipidemia   . CVA (cerebral vascular accident) (HCC) 03/2006    right occipital, Dr. Thad Ranger  . Hypertension   . Anxiety     Past Surgical History  Procedure Laterality Date  . Cesarean section    . Tonsillectomy and adenoidectomy    . Esophagogastroduodenoscopy  05/2005  . Cardiovascular stress test  1/15    myoview. EF 60%    Family History  Problem Relation Age of Onset  . Coronary artery disease Mother   . Diabetes Mellitus II Mother   . Heart attack Mother 41  . Diabetes Mellitus II Maternal Grandmother     Social History   Social History  . Marital Status: Divorced    Spouse Name:  N/A  . Number of Children: 2  . Years of Education: N/A   Occupational History  . LAB [employer]    Social History Main Topics  . Smoking status: Never Smoker   . Smokeless tobacco: Never Used  . Alcohol Use: No     Comment: wine occassionally  . Drug Use: No  . Sexual Activity: Yes    Birth Control/ Protection: Condom   Other Topics Concern  . Not on file   Social History Narrative   Review of Systems Having some congestion for 5 weeks Ongoing cough--though some better No fever No sig SOB    Objective:   Physical Exam  Constitutional: She is oriented to person, place, and time. She appears well-developed and well-nourished. No  distress.  HENT:  Mouth/Throat: Oropharynx is clear and moist. No oropharyngeal exudate.  Eyes: Conjunctivae and EOM are normal. Pupils are equal, round, and reactive to light.  Disc sharp on left--not well seen on right  Neck: Normal range of motion. Neck supple. No thyromegaly present.  Cardiovascular: Normal rate, regular rhythm and normal heart sounds.  Exam reveals no gallop.   No murmur heard. Pulmonary/Chest: Effort normal and breath sounds normal. No respiratory distress. She has no wheezes. She has no rales.  Musculoskeletal: She exhibits no edema.  Lymphadenopathy:    She has no cervical adenopathy.  Neurological: She is alert and oriented to person, place, and time. She has normal strength. She displays no tremor. No cranial nerve deficit or sensory deficit. She exhibits normal muscle tone. She displays a negative Romberg sign. Coordination and gait normal.          Assessment & Plan:

## 2015-04-16 NOTE — Progress Notes (Signed)
Pre visit review using our clinic review tool, if applicable. No additional management support is needed unless otherwise documented below in the visit note. 

## 2015-04-16 NOTE — Assessment & Plan Note (Signed)
Not amaurosis fugax---just blurring. Not concerned for carotid disease then (and no bruit heard) Optic neuritis can give central blurring--but her exam looks normal Doubt retinal disease (and symptoms not consistent with retinal tear)----but still needs thorough exam now ??migraine variant--but hasn't had previous symptoms  P: opto exam right away     Further eval if ongoing symptoms

## 2015-04-17 ENCOUNTER — Encounter: Payer: Self-pay | Admitting: Internal Medicine

## 2015-04-25 ENCOUNTER — Encounter: Payer: Self-pay | Admitting: Internal Medicine

## 2015-04-25 ENCOUNTER — Ambulatory Visit (INDEPENDENT_AMBULATORY_CARE_PROVIDER_SITE_OTHER): Payer: 59 | Admitting: Internal Medicine

## 2015-04-25 VITALS — BP 132/82 | HR 62 | Temp 97.6°F | Ht 63.0 in | Wt 226.8 lb

## 2015-04-25 DIAGNOSIS — Z1211 Encounter for screening for malignant neoplasm of colon: Secondary | ICD-10-CM

## 2015-04-25 DIAGNOSIS — E7800 Pure hypercholesterolemia, unspecified: Secondary | ICD-10-CM | POA: Diagnosis not present

## 2015-04-25 DIAGNOSIS — I10 Essential (primary) hypertension: Secondary | ICD-10-CM

## 2015-04-25 DIAGNOSIS — E1121 Type 2 diabetes mellitus with diabetic nephropathy: Secondary | ICD-10-CM

## 2015-04-25 DIAGNOSIS — F39 Unspecified mood [affective] disorder: Secondary | ICD-10-CM

## 2015-04-25 DIAGNOSIS — E119 Type 2 diabetes mellitus without complications: Secondary | ICD-10-CM

## 2015-04-25 DIAGNOSIS — Z0000 Encounter for general adult medical examination without abnormal findings: Secondary | ICD-10-CM | POA: Diagnosis not present

## 2015-04-25 LAB — HM DIABETES FOOT EXAM

## 2015-04-25 MED ORDER — LISINOPRIL 20 MG PO TABS: 20.0000 mg | ORAL_TABLET | Freq: Every day | ORAL | Status: DC

## 2015-04-25 NOTE — Assessment & Plan Note (Signed)
No problems with statin 

## 2015-04-25 NOTE — Addendum Note (Signed)
Addended by: Baldomero LamyHAVERS, Jlon Betker C on: 04/25/2015 09:33 AM   Modules accepted: Orders, SmartSet

## 2015-04-25 NOTE — Assessment & Plan Note (Signed)
Doing some better with benzos Failed multiple SSRI and other trials

## 2015-04-25 NOTE — Progress Notes (Signed)
Subjective:    Patient ID: Tami Jones, female    DOB: Nov 11, 1955, 60 y.o.   MRN: [id number]  HPI Here for physical  Vision is better Went to eye doctor and got new glasses Has cataract-- may need removed before too long  BP has been okay  Trouble with stomach Gets bloated and pain with bending over Soreness under lower sternum--mostly since sick over the past month Heartburn/reflux mostly controlled No swallowing problems--but throat does stay sore  Checks sugars once in a while Usually 130-140 No sensory changes, ulcer or pain in feet Recent eye exam  Mood has been better Takes the clonazepam at night . Still takes a while to initiate sleep Discussed sleep hygiene Regular with alprazolam in day  Still sees Dr Luella Cook  Current Outpatient Prescriptions on File Prior to Visit  Medication Sig Dispense Refill  . albuterol (PROVENTIL HFA;VENTOLIN HFA) 108 (90 BASE) MCG/ACT inhaler Inhale 2 puffs into the lungs every 6 (six) hours as needed for wheezing or shortness of breath. 1 Inhaler 0  . ALPRAZolam (XANAX) 0.25 MG tablet Take 1 tablet (0.25 mg total) by mouth 2 (two) times daily as needed. 180 tablet 0  . aspirin 81 MG tablet Take 81 mg by mouth daily.      Marland Kitchen atorvastatin (LIPITOR) 20 MG tablet Take 1 tablet (20 mg total) by mouth daily. 90 tablet 3  . clonazePAM (KLONOPIN) 0.5 MG tablet Take 1 tablet (0.5 mg total) by mouth at bedtime. 90 tablet 0  . hydrochlorothiazide (HYDRODIURIL) 25 MG tablet Take 1 tablet (25 mg total) by mouth daily. 90 tablet 3  . metoprolol succinate (TOPROL-XL) 100 MG 24 hr tablet Take 1 tablet (100 mg total) by mouth daily. Take with or immediately following a meal. 90 tablet 3  . omeprazole (PRILOSEC) 20 MG capsule Take 1 capsule (20 mg total) by mouth daily. 30 capsule 11  . lisinopril (PRINIVIL,ZESTRIL) 20 MG tablet Take 2 tablets (40 mg total) by mouth daily. (Patient not taking: Reported on 04/25/2015) 180 tablet 3   No current  facility-administered medications on file prior to visit.    Allergies  Allergen Reactions  . Citalopram Other (See Comments)    intolerant  . Doxycycline Hyclate Other (See Comments)    REACTION: stomach upset  . Erythromycin   . Montelukast Sodium Other (See Comments)    REACTION: unspecified  . Nitrofurantoin Other (See Comments)    REACTION: unspecified  . Sulfa Antibiotics Nausea Only  . Tramadol Hcl Other (See Comments)    REACTION: Can not tolerate  . Venlafaxine Hcl Other (See Comments)    Intolerant  . Amoxicillin-Pot Clavulanate Nausea Only    Past Medical History  Diagnosis Date  . Diabetes mellitus   . GERD (gastroesophageal reflux disease)   . Hyperlipidemia   . CVA (cerebral vascular accident) (HCC) 03/2006    right occipital, Dr. Thad Ranger  . Hypertension   . Anxiety     Past Surgical History  Procedure Laterality Date  . Cesarean section    . Tonsillectomy and adenoidectomy    . Esophagogastroduodenoscopy  05/2005  . Cardiovascular stress test  1/15    myoview. EF 60%    Family History  Problem Relation Age of Onset  . Coronary artery disease Mother   . Diabetes Mellitus II Mother   . Heart attack Mother 66  . Diabetes Mellitus II Maternal Grandmother     Social History   Social History  . Marital Status: Divorced  Spouse Name: N/A  . Number of Children: 2  . Years of Education: N/A   Occupational History  . LAB [employer]    Social History Main Topics  . Smoking status: Never Smoker   . Smokeless tobacco: Never Used  . Alcohol Use: No     Comment: wine occassionally  . Drug Use: No  . Sexual Activity: Yes    Birth Control/ Protection: Condom   Other Topics Concern  . Not on file   Social History Narrative   Review of Systems  Constitutional: Positive for fatigue. Negative for unexpected weight change.       Wears seat belt  HENT: Positive for dental problem. Negative for hearing loss and tinnitus.        Needs work done with  Dr Montez Moritaarter  Eyes: Positive for visual disturbance.       Rx for dry eyes  Respiratory: Positive for cough. Negative for shortness of breath.        Slight cough--with illness  Cardiovascular: Positive for palpitations.       Sporadic vague chest pain--like in bed (mostly substernal) Brief racing heart in bed at night  Gastrointestinal: Positive for constipation. Negative for abdominal pain and blood in stool.  Endocrine: Negative for polydipsia and polyuria.  Genitourinary: Negative for dysuria and hematuria.  Musculoskeletal: Positive for back pain and arthralgias. Negative for joint swelling.       Bad low back pain Hand pain also  Skin: Negative for rash.       No suspicious lesions  Allergic/Immunologic: Positive for environmental allergies. Negative for immunocompromised state.       Sensitive to something in her lab  Neurological: Positive for headaches. Negative for dizziness, syncope, weakness and light-headedness.  Hematological: Negative for adenopathy. Does not bruise/bleed easily.  Psychiatric/Behavioral: Positive for sleep disturbance. Negative for dysphoric mood. The patient is nervous/anxious.        Mostly just "tired"       Objective:   Physical Exam  Constitutional: She is oriented to person, place, and time. She appears well-developed and well-nourished. No distress.  HENT:  Head: Normocephalic and atraumatic.  Right Ear: External ear normal.  Left Ear: External ear normal.  Mouth/Throat: Oropharynx is clear and moist. No oropharyngeal exudate.  Eyes: Conjunctivae are normal. Pupils are equal, round, and reactive to light.  Neck: Normal range of motion. Neck supple. No thyromegaly present.  Cardiovascular: Normal rate, regular rhythm, normal heart sounds and intact distal pulses.  Exam reveals no gallop.   No murmur heard. Pulmonary/Chest: Effort normal and breath sounds normal. No respiratory distress. She has no wheezes. She has no rales.  Abdominal: Soft.  There is no tenderness.  Musculoskeletal: She exhibits no edema or tenderness.  Lymphadenopathy:    She has no cervical adenopathy.  Neurological: She is alert and oriented to person, place, and time.  Normal sensation in feet  Skin: No rash noted. No erythema.  No foot lesions  Psychiatric:  Somewhat flat as usual--but not depressed          Assessment & Plan:

## 2015-04-25 NOTE — Progress Notes (Signed)
Pre visit review using our clinic review tool, if applicable. No additional management support is needed unless otherwise documented below in the visit note. 

## 2015-04-25 NOTE — Patient Instructions (Signed)
Please set up your screening mammogram. 

## 2015-04-25 NOTE — Assessment & Plan Note (Signed)
Diet controlled Hopefully still well controlled

## 2015-04-25 NOTE — Assessment & Plan Note (Signed)
Still fatigued---seems to still have somatization More stable now though UTD on pap Overdue for mammogram Will do fecal immunoassay

## 2015-04-25 NOTE — Assessment & Plan Note (Addendum)
BP Readings from Last 3 Encounters:  04/25/15 132/82  04/16/15 140/80  03/20/15 120/80   Good control Thinks she stopped the lisinopril--will restart at just 20mg  daily

## 2015-04-26 LAB — COMPREHENSIVE METABOLIC PANEL
ALBUMIN: 4 g/dL (ref 3.5–5.5)
ALK PHOS: 83 IU/L (ref 39–117)
ALT: 18 IU/L (ref 0–32)
AST: 15 IU/L (ref 0–40)
Albumin/Globulin Ratio: 1.7 (ref 1.1–2.5)
BUN/Creatinine Ratio: 18 (ref 9–23)
BUN: 20 mg/dL (ref 6–24)
Bilirubin Total: 0.3 mg/dL (ref 0.0–1.2)
CALCIUM: 9.2 mg/dL (ref 8.7–10.2)
CO2: 23 mmol/L (ref 18–29)
CREATININE: 1.12 mg/dL — AB (ref 0.57–1.00)
Chloride: 99 mmol/L (ref 96–106)
GFR calc Af Amer: 63 mL/min/{1.73_m2} (ref >59)
GFR, EST NON AFRICAN AMERICAN: 54 mL/min/{1.73_m2} — AB (ref >59)
GLOBULIN, TOTAL: 2.4 g/dL (ref 1.5–4.5)
GLUCOSE: 159 mg/dL — AB (ref 65–99)
Potassium: 4.5 mmol/L (ref 3.5–5.2)
SODIUM: 140 mmol/L (ref 134–144)
TOTAL PROTEIN: 6.4 g/dL (ref 6.0–8.5)

## 2015-04-26 LAB — LIPID PANEL
CHOL/HDL RATIO: 5.8 ratio — AB (ref 0.0–4.4)
Cholesterol, Total: 231 mg/dL — ABNORMAL HIGH (ref 100–199)
HDL: 40 mg/dL (ref >39)
LDL Calculated: 169 mg/dL — ABNORMAL HIGH (ref 0–99)
TRIGLYCERIDES: 112 mg/dL (ref 0–149)
VLDL CHOLESTEROL CAL: 22 mg/dL (ref 5–40)

## 2015-04-26 LAB — CBC WITH DIFFERENTIAL/PLATELET
BASOS: 1 %
Basophils Absolute: 0 10*3/uL (ref 0.0–0.2)
EOS (ABSOLUTE): 0.1 10*3/uL (ref 0.0–0.4)
EOS: 2 %
HEMATOCRIT: 39.1 % (ref 34.0–46.6)
Hemoglobin: 12.9 g/dL (ref 11.1–15.9)
IMMATURE GRANS (ABS): 0 10*3/uL (ref 0.0–0.1)
IMMATURE GRANULOCYTES: 0 %
LYMPHS: 41 %
Lymphocytes Absolute: 1.9 10*3/uL (ref 0.7–3.1)
MCH: 29.7 pg (ref 26.6–33.0)
MCHC: 33 g/dL (ref 31.5–35.7)
MCV: 90 fL (ref 79–97)
MONOCYTES: 8 %
MONOS ABS: 0.4 10*3/uL (ref 0.1–0.9)
NEUTROS PCT: 48 %
Neutrophils Absolute: 2.3 10*3/uL (ref 1.4–7.0)
Platelets: 223 10*3/uL (ref 150–379)
RBC: 4.34 x10E6/uL (ref 3.77–5.28)
RDW: 13.3 % (ref 12.3–15.4)
WBC: 4.7 10*3/uL (ref 3.4–10.8)

## 2015-04-26 LAB — HEMOGLOBIN A1C
Est. average glucose Bld gHb Est-mCnc: 151 mg/dL
Hgb A1c MFr Bld: 6.9 % — ABNORMAL HIGH (ref 4.8–5.6)

## 2015-04-26 NOTE — Assessment & Plan Note (Addendum)
Reviewed labs Has stable, mild renal insufficiency Is on lisinopril (though not sure she was taking)

## 2015-05-01 ENCOUNTER — Telehealth: Payer: Self-pay

## 2015-05-01 NOTE — Telephone Encounter (Signed)
She said she just received the FOB test in the mail yesterday because it was not given to her at the visit. She will perform the test and send it back

## 2015-05-01 NOTE — Telephone Encounter (Signed)
-----   Message from Karie Schwalbeichard I Letvak, MD sent at 04/30/2015  9:49 PM EDT ----- Please see if she has done this ----- Message -----    From: SYSTEM    Sent: 04/30/2015  12:06 AM      To: Karie Schwalbeichard I Letvak, MD

## 2015-05-01 NOTE — Telephone Encounter (Signed)
Tried to call patient. Cell/Home # voice mail is full.

## 2015-05-01 NOTE — Telephone Encounter (Signed)
Patient said she is felling better.

## 2015-05-01 NOTE — Progress Notes (Signed)
Tried to call patient. Home/Cell voice mail is full. I cannot leave a message.

## 2015-05-20 ENCOUNTER — Ambulatory Visit (INDEPENDENT_AMBULATORY_CARE_PROVIDER_SITE_OTHER): Payer: 59 | Admitting: Primary Care

## 2015-05-20 ENCOUNTER — Telehealth: Payer: Self-pay | Admitting: Primary Care

## 2015-05-20 ENCOUNTER — Encounter: Payer: Self-pay | Admitting: Primary Care

## 2015-05-20 VITALS — BP 144/84 | HR 72 | Temp 98.0°F | Ht 63.0 in | Wt 226.1 lb

## 2015-05-20 DIAGNOSIS — R1084 Generalized abdominal pain: Secondary | ICD-10-CM | POA: Diagnosis not present

## 2015-05-20 DIAGNOSIS — R110 Nausea: Secondary | ICD-10-CM | POA: Diagnosis not present

## 2015-05-20 LAB — POCT GLUCOSE (DEVICE FOR HOME USE): POC Glucose: 192 mg/dl — AB (ref 70–99)

## 2015-05-20 LAB — POC URINALSYSI DIPSTICK (AUTOMATED)
Bilirubin, UA: NEGATIVE
Blood, UA: NEGATIVE
Glucose, UA: NEGATIVE
KETONES UA: NEGATIVE
LEUKOCYTES UA: NEGATIVE
Nitrite, UA: NEGATIVE
PH UA: 5
PROTEIN UA: NEGATIVE
Spec Grav, UA: >=1.03
UROBILINOGEN UA: NEGATIVE

## 2015-05-20 NOTE — Progress Notes (Signed)
Subjective:    Patient ID: Rolene Arbourynthia B Hope, female    DOB: 12/28/1955, 60 y.o.   MRN: [id number]  HPI  Ms. Barradas is a 60 year old female who presents today with a history of type 2 diabetes controlled, HTN, and Mood Disorder who presents today with a chief complaint of jittery feeling.   She also reports feeling shaky, bilateral flank pain, left lateral side pain, nausea, fatigue, and groin discomfort. She had 1 episode of vomiting this morning. Her abdominal pain is generalized. Denies dysuria, hematuria, vaginal discharge, fevers, cough, diarrhea, constipation, syncope. Her symptoms have been present since Sunday this week. She's not had her morning medications today as she felt nauseated. Her nausea is unaffected by eating and drinking. She's having daily bowel movements with the last movement this morning.   Review of Systems  Constitutional: Positive for fatigue. Negative for fever.  HENT: Negative for rhinorrhea.   Respiratory: Positive for shortness of breath. Negative for cough.   Cardiovascular: Negative for chest pain.  Gastrointestinal: Positive for nausea, vomiting and abdominal pain. Negative for diarrhea.  Genitourinary: Positive for frequency, flank pain and pelvic pain. Negative for dysuria and vaginal discharge.  Musculoskeletal: Negative for myalgias.  Neurological: Positive for dizziness and headaches.       Past Medical History  Diagnosis Date  . Diabetes mellitus   . GERD (gastroesophageal reflux disease)   . Hyperlipidemia   . CVA (cerebral vascular accident) (HCC) 03/2006    right occipital, Dr. Thad Rangereynolds  . Hypertension   . Anxiety     Social History   Social History  . Marital Status: Divorced    Spouse Name: N/A  . Number of Children: 2  . Years of Education: N/A   Occupational History  . LAB [employer]    Social History Main Topics  . Smoking status: Never Smoker   . Smokeless tobacco: Never Used  . Alcohol Use: No     Comment: wine occassionally    . Drug Use: No  . Sexual Activity: Yes    Birth Control/ Protection: Condom   Other Topics Concern  . Not on file   Social History Narrative    Past Surgical History  Procedure Laterality Date  . Cesarean section    . Tonsillectomy and adenoidectomy    . Esophagogastroduodenoscopy  05/2005  . Cardiovascular stress test  1/15    myoview. EF 60%    Family History  Problem Relation Age of Onset  . Coronary artery disease Mother   . Diabetes Mellitus II Mother   . Heart attack Mother [id number]  . Diabetes Mellitus II Maternal Grandmother     Allergies  Allergen Reactions  . Citalopram Other (See Comments)    intolerant  . Doxycycline Hyclate Other (See Comments)    REACTION: stomach upset  . Erythromycin   . Montelukast Sodium Other (See Comments)    REACTION: unspecified  . Nitrofurantoin Other (See Comments)    REACTION: unspecified  . Sulfa Antibiotics Nausea Only  . Tramadol Hcl Other (See Comments)    REACTION: Can not tolerate  . Venlafaxine Hcl Other (See Comments)    Intolerant  . Amoxicillin-Pot Clavulanate Nausea Only    Current Outpatient Prescriptions on File Prior to Visit  Medication Sig Dispense Refill  . albuterol (PROVENTIL HFA;VENTOLIN HFA) 108 (90 BASE) MCG/ACT inhaler Inhale 2 puffs into the lungs every 6 (six) hours as needed for wheezing or shortness of breath. 1 Inhaler 0  . ALPRAZolam (XANAX) 0.25 MG  tablet Take 1 tablet (0.25 mg total) by mouth 2 (two) times daily as needed. 180 tablet 0  . aspirin 81 MG tablet Take 81 mg by mouth daily.      Marland Kitchen atorvastatin (LIPITOR) 20 MG tablet Take 1 tablet (20 mg total) by mouth daily. 90 tablet 3  . clonazePAM (KLONOPIN) 0.5 MG tablet Take 1 tablet (0.5 mg total) by mouth at bedtime. 90 tablet 0  . hydrochlorothiazide (HYDRODIURIL) 25 MG tablet Take 1 tablet (25 mg total) by mouth daily. 90 tablet 3  . lisinopril (PRINIVIL,ZESTRIL) 20 MG tablet Take 1 tablet (20 mg total) by mouth daily. 90 tablet 3  .  metoprolol succinate (TOPROL-XL) 100 MG 24 hr tablet Take 1 tablet (100 mg total) by mouth daily. Take with or immediately following a meal. 90 tablet 3  . omeprazole (PRILOSEC) 20 MG capsule Take 1 capsule (20 mg total) by mouth daily. 30 capsule 11   No current facility-administered medications on file prior to visit.    BP 144/84 mmHg  Pulse 72  Temp(Src) 98 F (36.7 C) (Oral)  Ht  (1.6 m)  Wt 226 lb 1.9 oz (102.567 kg)  BMI 40.07 kg/m2  SpO2 99%    Objective:   Physical Exam  Constitutional: She is oriented to person, place, and time. She appears well-nourished.  Appears mildly distressed  Eyes: EOM are normal. Pupils are equal, round, and reactive to light.  Neck: Neck supple.  Cardiovascular: Normal rate and regular rhythm.   Pulmonary/Chest: Effort normal and breath sounds normal. She has no wheezes. She has no rales.  Abdominal: Soft. Normal appearance and bowel sounds are normal. There is generalized tenderness. There is CVA tenderness. There is no guarding, no tenderness at McBurney's point and negative Murphy's sign.  Neurological: She is alert and oriented to person, place, and time. No cranial nerve deficit.  Skin: Skin is warm and dry.          Assessment & Plan:  Jittery:  Jittery feeling, nausea, abdominal discomfort, flank pain, groin pain, fatigue x 2 days. Appears distressed and uncomfortable, not anxious, not toxic. Exam with clear lungs, mild CVA tenderness, generalized abdominal pain.  CBG today 192 1 hour after eating breakfast.  UA: Negative. No evidence of bacteria, blood, nitrites.  ECG: NSR, rate of 61, no ST depression or elevation, no PVC, PAC's. Unchanged when compared to ECG in December 2016. Will complete CBC, CMP, Lipase which are pending. Strict ED and return precautions provided.

## 2015-05-20 NOTE — Telephone Encounter (Signed)
Patient called and asked to be called back when you receive her lab results.

## 2015-05-20 NOTE — Patient Instructions (Signed)
Complete lab work prior to leaving today. I will notify you of your results once received.   Your ECG and urine tests look good.  I will be in touch with you later.   Please go to the ED if you start to feel worse (increased fatigue, chest pain, dizziness, increased abdominal pain.)  It was a pleasure meeting you!

## 2015-05-20 NOTE — Progress Notes (Signed)
Pre visit review using our clinic review tool, if applicable. No additional management support is needed unless otherwise documented below in the visit note. 

## 2015-05-21 LAB — COMPREHENSIVE METABOLIC PANEL
ALK PHOS: 77 IU/L (ref 39–117)
ALT: 17 IU/L (ref 0–32)
AST: 20 IU/L (ref 0–40)
Albumin/Globulin Ratio: 1.6 (ref 1.2–2.2)
Albumin: 3.9 g/dL (ref 3.5–5.5)
BUN/Creatinine Ratio: 13 (ref 9–23)
BUN: 15 mg/dL (ref 6–24)
Bilirubin Total: 0.2 mg/dL (ref 0.0–1.2)
CALCIUM: 9.1 mg/dL (ref 8.7–10.2)
CO2: 24 mmol/L (ref 18–29)
CREATININE: 1.14 mg/dL — AB (ref 0.57–1.00)
Chloride: 104 mmol/L (ref 96–106)
GFR calc Af Amer: 61 mL/min/{1.73_m2} (ref >59)
GFR, EST NON AFRICAN AMERICAN: 53 mL/min/{1.73_m2} — AB (ref >59)
GLUCOSE: 112 mg/dL — AB (ref 65–99)
Globulin, Total: 2.4 g/dL (ref 1.5–4.5)
Potassium: 4.5 mmol/L (ref 3.5–5.2)
SODIUM: 145 mmol/L — AB (ref 134–144)
Total Protein: 6.3 g/dL (ref 6.0–8.5)

## 2015-05-21 LAB — CBC WITH DIFFERENTIAL/PLATELET
BASOS ABS: 0 10*3/uL (ref 0.0–0.2)
Basos: 0 %
EOS (ABSOLUTE): 0.1 10*3/uL (ref 0.0–0.4)
EOS: 1 %
HEMATOCRIT: 38.4 % (ref 34.0–46.6)
Hemoglobin: 12.7 g/dL (ref 11.1–15.9)
IMMATURE GRANULOCYTES: 0 %
Immature Grans (Abs): 0 10*3/uL (ref 0.0–0.1)
LYMPHS ABS: 1.9 10*3/uL (ref 0.7–3.1)
Lymphs: 35 %
MCH: 30 pg (ref 26.6–33.0)
MCHC: 33.1 g/dL (ref 31.5–35.7)
MCV: 91 fL (ref 79–97)
MONOS ABS: 0.4 10*3/uL (ref 0.1–0.9)
Monocytes: 7 %
NEUTROS PCT: 57 %
Neutrophils Absolute: 3.1 10*3/uL (ref 1.4–7.0)
PLATELETS: 243 10*3/uL (ref 150–379)
RBC: 4.24 x10E6/uL (ref 3.77–5.28)
RDW: 13.5 % (ref 12.3–15.4)
WBC: 5.5 10*3/uL (ref 3.4–10.8)

## 2015-05-21 LAB — LIPASE: LIPASE: 47 U/L (ref 0–59)

## 2015-05-21 NOTE — Telephone Encounter (Signed)
Called and notified patient of Tami Jones's comments. Patient verbalized understanding.  

## 2015-05-21 NOTE — Telephone Encounter (Signed)
Results received this morning. [facility] available.

## 2015-06-11 ENCOUNTER — Other Ambulatory Visit: Payer: Self-pay | Admitting: Internal Medicine

## 2015-06-11 NOTE — Telephone Encounter (Signed)
Pt said that [facility] has been out of klonopin; pt has never received the # 90 on 03/26/15. Pt request at least 10 day supply until gets klonopin from [facility] rx. Pt request [facility] [address]. Pt is out of Klonopin.

## 2015-06-11 NOTE — Telephone Encounter (Signed)
Last filled 05-06-15 #30 Last OV 05-20-15 acute Next OV 10-28-15

## 2015-06-12 NOTE — Telephone Encounter (Signed)
Left refill on voice mail at pharmacy. Tried to call patient back on number provided but it says she is not available then just hangs up. No voice mail as DPR says.

## 2015-06-12 NOTE — Telephone Encounter (Signed)
Lad[facility]aylor [facility] [address] said that pts ins will not approve klonopin # 30 because mail order sent out rx for klonopin on 06/11/15. Pt only requested 10 day rx and Ladona Ridgelaylor said he can fill # 10 for $4.00. Irish Lackdvised Taylor to do that. Was unable to leave message for pt on work # and the cell # said mail box full. Per DPR I spoke with Judeth CornfieldStephanie and she will let pt know the above info.

## 2015-06-12 NOTE — Telephone Encounter (Signed)
Approved: 30 x 0 

## 2015-06-17 ENCOUNTER — Telehealth: Payer: Self-pay | Admitting: Internal Medicine

## 2015-06-17 NOTE — Telephone Encounter (Signed)
Patient Name: Tami Jones  DOB: 01/14/1956    Initial Comment Caller states she feels bloated and she is having shortness of breath her whole body feels tight; feet are not swollen just hard to breath today.   Nurse Assessment  Nurse: Annye Englisharmon, RN, Angelique Blonderenise Date/Time (Eastern Time): 06/17/2015 5:02:33 PM  Confirm and document reason for call. If symptomatic, describe symptoms. You must click the next button to save text entered. ---Caller states she feels bloated and she is having shortness of breath her whole body feels tight. States she is not swelling anywhere.  Has the patient traveled out of the country within the last 30 days? ---Not Applicable  Does the patient have any new or worsening symptoms? ---Yes  Will a triage be completed? ---Yes  Related visit to physician within the last 2 weeks? ---No  Does the PT have any chronic conditions? (i.e. diabetes, asthma, etc.) ---Yes  List chronic conditions. ---HTN  Is this a behavioral health or substance abuse call? ---No     Guidelines    Guideline Title Affirmed Question Affirmed Notes  Breathing Difficulty [1] MODERATE difficulty breathing (e.g., speaks in phrases, SOB even at rest, pulse 100-120) AND [2] NEW-onset or WORSE than normal    Final Disposition User   Go to ED Now Annye Englisharmon, RN, TEPPCO PartnersDenise    Referrals  [facility] - ED   Disagree/Comply: Comply

## 2015-06-18 ENCOUNTER — Encounter: Payer: Self-pay | Admitting: Internal Medicine

## 2015-06-18 ENCOUNTER — Ambulatory Visit (INDEPENDENT_AMBULATORY_CARE_PROVIDER_SITE_OTHER): Payer: 59 | Admitting: Internal Medicine

## 2015-06-18 VITALS — BP 144/96 | HR 64 | Temp 97.8°F | Wt 229.5 lb

## 2015-06-18 DIAGNOSIS — R5383 Other fatigue: Secondary | ICD-10-CM | POA: Diagnosis not present

## 2015-06-18 DIAGNOSIS — G4719 Other hypersomnia: Secondary | ICD-10-CM | POA: Diagnosis not present

## 2015-06-18 DIAGNOSIS — I10 Essential (primary) hypertension: Secondary | ICD-10-CM

## 2015-06-18 DIAGNOSIS — R635 Abnormal weight gain: Secondary | ICD-10-CM

## 2015-06-18 NOTE — Telephone Encounter (Signed)
Patient said her breathing is tight and not feeling well today. She did not go to the ER. I asked her if she thought it was anxiety related. She said she took her medication and felt the same. She does not have a big appetite. Call on cell [phone]

## 2015-06-18 NOTE — Telephone Encounter (Signed)
I spoke with patient and she scheduled appointment with Rene Kocheregina at 1:45.

## 2015-06-18 NOTE — Patient Instructions (Signed)
Fatigue  Fatigue is feeling tired all of the time, a lack of energy, or a lack of motivation. Occasional or mild fatigue is often a normal response to activity or life in general. However, long-lasting (chronic) or extreme fatigue may indicate an underlying medical condition.  HOME CARE INSTRUCTIONS   Watch your fatigue for any changes. The following actions may help to lessen any discomfort you are feeling:  · Talk to your health care provider about how much sleep you need each night. Try to get the required amount every night.  · Take medicines only as directed by your health care provider.  · Eat a healthy and nutritious diet. Ask your health care provider if you need help changing your diet.  · Drink enough fluid to keep your urine clear or pale yellow.  · Practice ways of relaxing, such as yoga, meditation, massage therapy, or acupuncture.  · Exercise regularly.    · Change situations that cause you stress. Try to keep your work and personal routine reasonable.  · Do not abuse illegal drugs.  · Limit alcohol intake to no more than 1 drink per day for nonpregnant women and 2 drinks per day for men. One drink equals 12 ounces of beer, 5 ounces of wine, or 1½ ounces of hard liquor.  · Take a multivitamin, if directed by your health care provider.  SEEK MEDICAL CARE IF:   · Your fatigue does not get better.  · You have a fever.    · You have unintentional weight loss or gain.  · You have headaches.    · You have difficulty:      Falling asleep.    Sleeping throughout the night.  · You feel angry, guilty, anxious, or sad.     · You are unable to have a bowel movement (constipation).    · You skin is dry.     · Your legs or another part of your body is swollen.    SEEK IMMEDIATE MEDICAL CARE IF:   · You feel confused.    · Your vision is blurry.  · You feel faint or pass out.    · You have a severe headache.    · You have severe abdominal, pelvic, or back pain.    · You have chest pain, shortness of breath, or an  irregular or fast heartbeat.    · You are unable to urinate or you urinate less than normal.    · You develop abnormal bleeding, such as bleeding from the rectum, vagina, nose, lungs, or nipples.  · You vomit blood.     · You have thoughts about harming yourself or committing suicide.    · You are worried that you might harm someone else.       This information is not intended to replace advice given to you by your health care provider. Make sure you discuss any questions you have with your health care provider.     Document Released: 11/29/2006 Document Revised: 02/22/2014 Document Reviewed: 06/05/2013  Elsevier Interactive Patient Education ©2016 Elsevier Inc.

## 2015-06-18 NOTE — Progress Notes (Signed)
Subjective:    Patient ID: Tami Jones, female    DOB: 11/03/1955, 60 y.o.   MRN: [id number]  HPI  Pt presents to the clinic today with c/o full-body tightness (2 days) and fatigue (1 week). She had a recent ECG 05/20/2015 which showed NSR, ECHO 07/11/2014 showed EF 60-65%. She denies edema, chest pain or DOE. She does feel like it's hard for her to take a deep breath. She is concerned that she is deficient in some vitamin. She only sleeps about 5 hours/night on 3 pillows, and wakes up at least 1/night to urinate. She denies waking up at night feeling SOB (chart indicates she has gone to the ED for this). She occasionally snores, and has a PMH of GERD and HTN. She reports that she would have no difficulty napping during the day, but she does not. She does take a Klonopin to fall asleep at night. She reports she has never been told that she has OSA in the past but she tried to have a sleep study but reports she was not able to fall asleep, and the "sticky things" would not stay on her head. Of note, she has consistently gaining weight.  Review of Systems  Past Medical History  Diagnosis Date  . Diabetes mellitus   . GERD (gastroesophageal reflux disease)   . Hyperlipidemia   . CVA (cerebral vascular accident) (HCC) 03/2006    right occipital, Dr. Thad Ranger  . Hypertension   . Anxiety     Current Outpatient Prescriptions  Medication Sig Dispense Refill  . albuterol (PROVENTIL HFA;VENTOLIN HFA) 108 (90 BASE) MCG/ACT inhaler Inhale 2 puffs into the lungs every 6 (six) hours as needed for wheezing or shortness of breath. 1 Inhaler 0  . aspirin 81 MG tablet Take 81 mg by mouth daily.      Marland Kitchen atorvastatin (LIPITOR) 20 MG tablet Take 1 tablet (20 mg total) by mouth daily. 90 tablet 3  . clonazePAM (KLONOPIN) 0.5 MG tablet TAKE ONE TABLET BY MOUTH AT BEDTIME 30 tablet 0  . hydrochlorothiazide (HYDRODIURIL) 25 MG tablet Take 1 tablet (25 mg total) by mouth daily. 90 tablet 3  . lisinopril  (PRINIVIL,ZESTRIL) 20 MG tablet Take 1 tablet (20 mg total) by mouth daily. 90 tablet 3  . metoprolol succinate (TOPROL-XL) 100 MG 24 hr tablet Take 1 tablet (100 mg total) by mouth daily. Take with or immediately following a meal. 90 tablet 3  . omeprazole (PRILOSEC) 20 MG capsule Take 1 capsule (20 mg total) by mouth daily. 30 capsule 11  . ALPRAZolam (XANAX) 0.25 MG tablet Take 1 tablet (0.25 mg total) by mouth 2 (two) times daily as needed. (Patient not taking: Reported on 06/18/2015) 180 tablet 0   No current facility-administered medications for this visit.    Allergies  Allergen Reactions  . Citalopram Other (See Comments)    intolerant  . Doxycycline Hyclate Other (See Comments)    REACTION: stomach upset  . Erythromycin   . Montelukast Sodium Other (See Comments)    REACTION: unspecified  . Nitrofurantoin Other (See Comments)    REACTION: unspecified  . Sulfa Antibiotics Nausea Only  . Tramadol Hcl Other (See Comments)    REACTION: Can not tolerate  . Venlafaxine Hcl Other (See Comments)    Intolerant  . Amoxicillin-Pot Clavulanate Nausea Only    Family History  Problem Relation Age of Onset  . Coronary artery disease Mother   . Diabetes Mellitus II Mother   . Heart attack Mother  63  . Diabetes Mellitus II Maternal Grandmother     Social History   Social History  . Marital Status: Divorced    Spouse Name: N/A  . Number of Children: 2  . Years of Education: N/A   Occupational History  . LAB [employer]    Social History Main Topics  . Smoking status: Never Smoker   . Smokeless tobacco: Never Used  . Alcohol Use: No     Comment: wine occassionally  . Drug Use: No  . Sexual Activity: Yes    Birth Control/ Protection: Condom   Other Topics Concern  . Not on file   Social History Narrative     Constitutional: Positive for fatigue and weight gain. Denies fever, headache or abrupt weight changes.  Respiratory: Positive for shortness of breath. Denies cough or  sputum production.   Cardiovascular: Denies chest pain, chest tightness, palpitations or swelling in the hands or feet.  Musculoskeletal: Denies weakness of her muscles or difficulty with gait Neurological: Denies dizziness or problems with balance and coordination.  Psych: Denies anxiety, depression, SI/HI.  No other specific complaints in a complete review of systems (except as listed in HPI above).     Objective:   Physical Exam  BP 144/96 mmHg  Pulse 64  Temp(Src) 97.8 F (36.6 C) (Oral)  Wt 229 lb 8 oz (104.101 kg)  SpO2 98% Wt Readings from Last 3 Encounters:  06/18/15 229 lb 8 oz (104.101 kg)  05/20/15 226 lb 1.9 oz (102.567 kg)  04/25/15 226 lb 12 oz (102.853 kg)    General: Appears her stated age, obese, and in NAD. Skin: Warm, dry and intact.  Neck:  Neck supple, trachea midline. No masses, lumps or thyromegaly present.  Cardiovascular: Normal rate and rhythm. S1,S2 noted.  No murmur, rubs or gallops noted. No JVD. Trace BLE edema noted. Pulmonary/Chest: Normal effort and positive vesicular breath sounds. No respiratory distress. No wheezes, rales or ronchi noted.  Musculoskeletal: Strength 5/5 bilaterally of upper and lower extremities.  Neurological: Alert and oriented.    BMET    Component Value Date/Time   NA 145* 05/20/2015 1106   NA 142 01/28/2015 1224   NA 140 03/04/2013 1849   K 4.5 05/20/2015 1106   K 4.1 03/04/2013 1849   CL 104 05/20/2015 1106   CL 105 03/04/2013 1849   CO2 24 05/20/2015 1106   CO2 30 03/04/2013 1849   GLUCOSE 112* 05/20/2015 1106   GLUCOSE 99 01/28/2015 1224   GLUCOSE 87 03/04/2013 1849   BUN 15 05/20/2015 1106   BUN 21* 01/28/2015 1224   BUN 18 03/04/2013 1849   CREATININE 1.14* 05/20/2015 1106   CREATININE 1.02 03/04/2013 1849   CALCIUM 9.1 05/20/2015 1106   CALCIUM 9.2 03/04/2013 1849   GFRNONAA 53* 05/20/2015 1106   GFRNONAA >60 03/04/2013 1849   GFRAA 61 05/20/2015 1106   GFRAA >60 03/04/2013 1849    Lipid Panel       Component Value Date/Time   CHOL 231* 04/25/2015 0934   CHOL 150 07/11/2014 0400   TRIG 112 04/25/2015 0934   HDL 40 04/25/2015 0934   HDL 38* 07/11/2014 0400   CHOLHDL 5.8* 04/25/2015 0934   CHOLHDL 3.9 07/11/2014 0400   VLDL 17 07/11/2014 0400   LDLCALC 169* 04/25/2015 0934   LDLCALC 95 07/11/2014 0400    CBC    Component Value Date/Time   WBC 5.5 05/20/2015 1106   WBC 5.7 01/28/2015 1224   WBC 6.5 03/04/2013 1849  RBC 4.24 05/20/2015 1106   RBC 4.40 01/28/2015 1224   RBC 4.61 03/04/2013 1849   HGB 13.4 01/28/2015 1224   HGB 14.4 03/04/2013 1849   HCT 38.4 05/20/2015 1106   HCT 40.6 01/28/2015 1224   HCT 42.4 03/04/2013 1849   PLT 243 05/20/2015 1106   PLT 198 01/28/2015 1224   PLT 215 03/04/2013 1849   MCV 91 05/20/2015 1106   MCV 92.4 01/28/2015 1224   MCV 92 03/04/2013 1849   MCH 30.0 05/20/2015 1106   MCH 30.4 01/28/2015 1224   MCH 31.1 03/04/2013 1849   MCHC 33.1 05/20/2015 1106   MCHC 32.9 01/28/2015 1224   MCHC 33.9 03/04/2013 1849   RDW 13.5 05/20/2015 1106   RDW 13.0 01/28/2015 1224   RDW 12.5 03/04/2013 1849   LYMPHSABS 1.9 05/20/2015 1106   LYMPHSABS 2.3 11/09/2014 0335   MONOABS 0.4 11/09/2014 0335   EOSABS 0.1 05/20/2015 1106   EOSABS 0.1 11/09/2014 0335   EOSABS 0.1 02/28/2014 1656   BASOSABS 0.0 05/20/2015 1106   BASOSABS 0.0 11/09/2014 0335    Hgb A1C Lab Results  Component Value Date   HGBA1C 6.9* 04/25/2015         Assessment & Plan:   Fatigue, Weight Gain, HTN, daytime sleepiness:  Check CBC, TSH, Vit D, Vit B12, and Folate today If labs are normal, consider pulmonary referral for sleep study Epworth Sleepiness scale: score of 2 Neck measurement: 15.6 inches  Will follow up after labs, RTC as needed

## 2015-06-18 NOTE — Addendum Note (Signed)
Addended by: Baldomero LamyHAVERS, NATASHA C on: 06/18/2015 04:35 PM   Modules accepted: Kipp BroodSmartSet

## 2015-06-18 NOTE — Telephone Encounter (Signed)
Unable to reach pt by phone; cell v/m is full and could not leave message and pt is not at work.

## 2015-06-18 NOTE — Progress Notes (Signed)
Pre visit review using our clinic review tool, if applicable. No additional management support is needed unless otherwise documented below in the visit note. 

## 2015-06-18 NOTE — Telephone Encounter (Signed)
Please have her make an appt---maybe Rene KocherRegina can see her

## 2015-06-18 NOTE — Telephone Encounter (Signed)
Try to reach her later No record of her in ER as yet This is usually her anxiety though

## 2015-06-19 LAB — CBC
HEMATOCRIT: 38.2 % (ref 34.0–46.6)
Hemoglobin: 12.9 g/dL (ref 11.1–15.9)
MCH: 30.4 pg (ref 26.6–33.0)
MCHC: 33.8 g/dL (ref 31.5–35.7)
MCV: 90 fL (ref 79–97)
PLATELETS: 243 10*3/uL (ref 150–379)
RBC: 4.24 x10E6/uL (ref 3.77–5.28)
RDW: 13.4 % (ref 12.3–15.4)
WBC: 5.2 10*3/uL (ref 3.4–10.8)

## 2015-06-19 LAB — VITAMIN B12: Vitamin B-12: 276 pg/mL (ref 211–946)

## 2015-06-19 LAB — FOLATE: FOLATE: 11.2 ng/mL (ref >3.0)

## 2015-06-19 LAB — TSH: TSH: 1.65 u[IU]/mL (ref 0.450–4.500)

## 2015-06-19 LAB — VITAMIN D 25 HYDROXY (VIT D DEFICIENCY, FRACTURES): Vit D, 25-Hydroxy: 24.4 ng/mL — ABNORMAL LOW (ref 30.0–100.0)

## 2015-06-27 ENCOUNTER — Other Ambulatory Visit: Payer: Self-pay | Admitting: Internal Medicine

## 2015-06-27 DIAGNOSIS — R0683 Snoring: Secondary | ICD-10-CM

## 2015-07-01 ENCOUNTER — Institutional Professional Consult (permissible substitution): Payer: 59 | Admitting: Internal Medicine

## 2015-07-15 ENCOUNTER — Ambulatory Visit (INDEPENDENT_AMBULATORY_CARE_PROVIDER_SITE_OTHER): Payer: 59 | Admitting: Family Medicine

## 2015-07-15 ENCOUNTER — Encounter: Payer: Self-pay | Admitting: Family Medicine

## 2015-07-15 VITALS — BP 164/92 | HR 78 | Temp 97.8°F | Wt 227.8 lb

## 2015-07-15 DIAGNOSIS — T148 Other injury of unspecified body region: Secondary | ICD-10-CM

## 2015-07-15 DIAGNOSIS — W57XXXA Bitten or stung by nonvenomous insect and other nonvenomous arthropods, initial encounter: Secondary | ICD-10-CM | POA: Insufficient documentation

## 2015-07-15 NOTE — Progress Notes (Signed)
Subjective:   Patient ID: Tami Jones, female    DOB: 02/16/1955, 60 y.o.   MRN: [id number]  Tami Jones is a pleasant 60 y.o. year old female pt of Dr. Alphonsus SiasLetvak, who presents to clinic today with Insect Bite  on 07/15/2015  HPI:  Tick bite-  Daughter removed a tick from her upper back yesterday.  She is not sure how long it was there. It was engorged.  Has had no fevers, chills, rashes, nausea, photophobia, myalgia or other symptoms that could be concerning for tick born illness.  Area is a little red so she wanted to make sure it did not look infected.  It is a little itchy but not painful.  Current Outpatient Prescriptions on File Prior to Visit  Medication Sig Dispense Refill  . albuterol (PROVENTIL HFA;VENTOLIN HFA) 108 (90 BASE) MCG/ACT inhaler Inhale 2 puffs into the lungs every 6 (six) hours as needed for wheezing or shortness of breath. 1 Inhaler 0  . aspirin 81 MG tablet Take 81 mg by mouth daily.      Marland Kitchen. atorvastatin (LIPITOR) 20 MG tablet Take 1 tablet (20 mg total) by mouth daily. 90 tablet 3  . clonazePAM (KLONOPIN) 0.5 MG tablet TAKE ONE TABLET BY MOUTH AT BEDTIME 30 tablet 0  . hydrochlorothiazide (HYDRODIURIL) 25 MG tablet Take 1 tablet (25 mg total) by mouth daily. 90 tablet 3  . lisinopril (PRINIVIL,ZESTRIL) 20 MG tablet Take 1 tablet (20 mg total) by mouth daily. 90 tablet 3  . metoprolol succinate (TOPROL-XL) 100 MG 24 hr tablet Take 1 tablet (100 mg total) by mouth daily. Take with or immediately following a meal. 90 tablet 3  . omeprazole (PRILOSEC) 20 MG capsule Take 1 capsule (20 mg total) by mouth daily. 30 capsule 11   No current facility-administered medications on file prior to visit.    Allergies  Allergen Reactions  . Citalopram Other (See Comments)    intolerant  . Doxycycline Hyclate Other (See Comments)    REACTION: stomach upset  . Erythromycin   . Montelukast Sodium Other (See Comments)    REACTION: unspecified  . Nitrofurantoin Other  (See Comments)    REACTION: unspecified  . Sulfa Antibiotics Nausea Only  . Tramadol Hcl Other (See Comments)    REACTION: Can not tolerate  . Venlafaxine Hcl Other (See Comments)    Intolerant  . Amoxicillin-Pot Clavulanate Nausea Only    Past Medical History  Diagnosis Date  . Diabetes mellitus   . GERD (gastroesophageal reflux disease)   . Hyperlipidemia   . CVA (cerebral vascular accident) (HCC) 03/2006    right occipital, Dr. Thad Rangereynolds  . Hypertension   . Anxiety     Past Surgical History  Procedure Laterality Date  . Cesarean section    . Tonsillectomy and adenoidectomy    . Esophagogastroduodenoscopy  05/2005  . Cardiovascular stress test  1/15    myoview. EF 60%    Family History  Problem Relation Age of Onset  . Coronary artery disease Mother   . Diabetes Mellitus II Mother   . Heart attack Mother [id number]  . Diabetes Mellitus II Maternal Grandmother     Social History   Social History  . Marital Status: Divorced    Spouse Name: N/A  . Number of Children: 2  . Years of Education: N/A   Occupational History  . LAB [employer]    Social History Main Topics  . Smoking status: Never Smoker   . Smokeless tobacco: Never  Used  . Alcohol Use: No     Comment: wine occassionally  . Drug Use: No  . Sexual Activity: Yes    Birth Control/ Protection: Condom   Other Topics Concern  . Not on file   Social History Narrative   The PMH, PSH, Social History, Family History, Medications, and allergies have been reviewed in [facility], and have been updated if relevant.  Review of Systems  Constitutional: Negative for fever.  HENT: Negative.   Eyes: Negative.   Respiratory: Negative.   Cardiovascular: Negative.   Gastrointestinal: Negative.   Musculoskeletal: Negative.   Skin: Negative.   Neurological: Negative.   Hematological: Negative.        Objective:    BP 164/92 mmHg  Pulse 78  Temp(Src) 97.8 F (36.6 C) (Oral)  Wt 227 lb 12 oz (103.307 kg)  SpO2  97%   Physical Exam  Constitutional: She is oriented to person, place, and time. She appears well-developed and well-nourished. No distress.  HENT:  Head: Normocephalic.  Eyes: Conjunctivae are normal.  Cardiovascular: Normal rate.   Pulmonary/Chest: Effort normal.  Musculoskeletal: Normal range of motion.  Neurological: She is alert and oriented to person, place, and time. No cranial nerve deficit.  Skin: She is not diaphoretic.     Psychiatric: She has a normal mood and affect. Her behavior is normal. Judgment and thought content normal.  Nursing note and vitals reviewed.         Assessment & Plan:   Tick bite No Follow-up on file.

## 2015-07-15 NOTE — Assessment & Plan Note (Signed)
New- mostly consistent with localized reaction at this point. Reassurance provided. Advised to alert us of any red flag symptoms concerning for tick born illness. Call or return to clinic prn if these symptoms worsen or fail to improve as anticipated. The patient indicates understanding of these issues and agrees with the plan.

## 2015-07-15 NOTE — Progress Notes (Signed)
Pre visit review using our clinic review tool, if applicable. No additional management support is needed unless otherwise documented below in the visit note. 

## 2015-07-22 ENCOUNTER — Institutional Professional Consult (permissible substitution): Payer: 59 | Admitting: Internal Medicine

## 2015-07-30 ENCOUNTER — Other Ambulatory Visit: Payer: Self-pay | Admitting: Internal Medicine

## 2015-07-31 ENCOUNTER — Telehealth: Payer: Self-pay | Admitting: Internal Medicine

## 2015-07-31 NOTE — Telephone Encounter (Signed)
[facility] [employer]ed [employer] paperwork Spoke with pt  She stated it was same as last time needs for next 6 months In dr Alphonsus Siasletvak In box for review and signature

## 2015-08-01 DIAGNOSIS — Z7689 Persons encountering health services in other specified circumstances: Secondary | ICD-10-CM

## 2015-08-01 NOTE — Telephone Encounter (Signed)
Faxed paperwork  Pt aware  Copy for file Copy for scan Copy for pt Copy for billing

## 2015-08-01 NOTE — Telephone Encounter (Signed)
Forms signed---thanks for filling out $20 charge

## 2015-09-03 ENCOUNTER — Other Ambulatory Visit: Payer: Self-pay | Admitting: Cardiovascular Disease

## 2015-09-04 ENCOUNTER — Other Ambulatory Visit: Payer: Self-pay

## 2015-09-04 MED ORDER — CLONAZEPAM 0.5 MG PO TABS: 0.5000 mg | ORAL_TABLET | Freq: Every day | ORAL | Status: DC

## 2015-09-04 NOTE — Telephone Encounter (Signed)
Faxed rx to [facility]

## 2015-09-04 NOTE — Telephone Encounter (Signed)
Last OV with Dr Alphonsus SiasLetvak 04-25-15 Next OV 10-28-15

## 2015-09-04 NOTE — Telephone Encounter (Signed)
Fax form received from [facility] for clonazepam. I have filled out the form and placed in Dr Karle StarchLetvak's Inbox on his desk to sign so it can be faxed back.

## 2015-09-04 NOTE — Telephone Encounter (Signed)
Signed #[phone] x 0

## 2015-09-19 ENCOUNTER — Telehealth: Payer: Self-pay | Admitting: Internal Medicine

## 2015-09-19 ENCOUNTER — Ambulatory Visit: Payer: 59 | Admitting: Family Medicine

## 2015-09-19 NOTE — Telephone Encounter (Signed)
I did keep apologizing to her.  She was upset that they called do late to cancel appointment.

## 2015-09-19 NOTE — Telephone Encounter (Signed)
Please apologize to her  I will plan to see her on Monday

## 2015-09-19 NOTE — Telephone Encounter (Signed)
Pt called to make an appointment this morning @ [facility] we had no openings I made her an appointment with dr Adriana Simas @ [facility] @ 10:41 for 3 Friday afternoon.  [place] No[place] called here @ 2:37 stating dr Adriana Simas would not see pt said it was not acute it was an ongoing problem. Pt wanted a call from [place] here at [facility].  I spoke with pt to make her an appointment with Dr Alphonsus Sias on Monday.  She stated she just received the call from [place] about 2:30 stating they could not see her. Pt made appointment for Monday  @ 2:45 with dr Alphonsus Sias  I transferred pt to team health for further evaluation.  Pt was very upset about not being today

## 2015-09-22 ENCOUNTER — Ambulatory Visit (INDEPENDENT_AMBULATORY_CARE_PROVIDER_SITE_OTHER): Payer: 59 | Admitting: Internal Medicine

## 2015-09-22 ENCOUNTER — Encounter: Payer: Self-pay | Admitting: Internal Medicine

## 2015-09-22 VITALS — BP 138/80 | HR 70 | Temp 98.0°F | Wt 234.0 lb

## 2015-09-22 DIAGNOSIS — I10 Essential (primary) hypertension: Secondary | ICD-10-CM | POA: Diagnosis not present

## 2015-09-22 DIAGNOSIS — R5383 Other fatigue: Secondary | ICD-10-CM | POA: Diagnosis not present

## 2015-09-22 DIAGNOSIS — R531 Weakness: Secondary | ICD-10-CM | POA: Insufficient documentation

## 2015-09-22 DIAGNOSIS — E119 Type 2 diabetes mellitus without complications: Secondary | ICD-10-CM | POA: Diagnosis not present

## 2015-09-22 NOTE — Assessment & Plan Note (Signed)
Vague No worrisome symptoms and exam is reassuring Will check labs Discussed getting back to gym

## 2015-09-22 NOTE — Progress Notes (Signed)
Subjective:    Patient ID: Tami Jones, female    DOB: 11/14/1955, 60 y.o.   MRN: [id number]  HPI Here due to problems with fatigue Last week, felt tired really bad (8/3 especially) Concern about leaning over to the side Felt the same way 8/4--so she called in  Did have some upper left chest pain Also LUQ abdominal pain when bending over  Works 8-12 hours a day As much as 6 days per week Up over 50 hours a week at times  Checks sugars periodically--?twice a week No low sugar reactions  She feels her mood is "pretty good" No using the alprazolam but does use the clonazepam at bedtime Not sad or depressed Discussed that HCTZ unlikely to be causing her symptoms  Current Outpatient Prescriptions on File Prior to Visit  Medication Sig Dispense Refill  . albuterol (PROVENTIL HFA;VENTOLIN HFA) 108 (90 BASE) MCG/ACT inhaler Inhale 2 puffs into the lungs every 6 (six) hours as needed for wheezing or shortness of breath. 1 Inhaler 0  . aspirin 81 MG tablet Take 81 mg by mouth daily.      Marland Kitchen. atorvastatin (LIPITOR) 20 MG tablet Take 1 tablet (20 mg total) by mouth daily. 90 tablet 3  . clonazePAM (KLONOPIN) 0.5 MG tablet Take 1 tablet (0.5 mg total) by mouth at bedtime. 90 tablet 0  . hydrochlorothiazide (HYDRODIURIL) 25 MG tablet TAKE ONE TABLET BY MOUTH ONCE DAILY 30 tablet 0  . lisinopril (PRINIVIL,ZESTRIL) 20 MG tablet Take 1 tablet (20 mg total) by mouth daily. 90 tablet 3  . metoprolol succinate (TOPROL-XL) 100 MG 24 hr tablet Take 1 tablet by mouth  daily with or immediately  following a meal 90 tablet 0  . omeprazole (PRILOSEC) 20 MG capsule Take 1 capsule (20 mg total) by mouth daily. 30 capsule 11   No current facility-administered medications on file prior to visit.     Allergies  Allergen Reactions  . Citalopram Other (See Comments)    intolerant  . Doxycycline Hyclate Other (See Comments)    REACTION: stomach upset  . Erythromycin   . Montelukast Sodium Other (See  Comments)    REACTION: unspecified  . Nitrofurantoin Other (See Comments)    REACTION: unspecified  . Sulfa Antibiotics Nausea Only  . Tramadol Hcl Other (See Comments)    REACTION: Can not tolerate  . Venlafaxine Hcl Other (See Comments)    Intolerant  . Amoxicillin-Pot Clavulanate Nausea Only    Past Medical History:  Diagnosis Date  . Anxiety   . 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 Thad Rangereynolds  . Diabetes mellitus   . GERD (gastroesophageal reflux disease)   . Hyperlipidemia   . Hypertension     Past Surgical History:  Procedure Laterality Date  . CARDIOVASCULAR STRESS TEST  1/15   myoview. EF 60%  . CESAREAN SECTION    . ESOPHAGOGASTRODUODENOSCOPY  05/2005  . TONSILLECTOMY AND ADENOIDECTOMY      Family History  Problem Relation Age of Onset  . Coronary artery disease Mother   . Diabetes Mellitus II Mother   . Heart attack Mother [id number]  . Diabetes Mellitus II Maternal Grandmother     Social History   Social History  . Marital status: Divorced    Spouse name: N/A  . Number of children: 2  . 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 Smoking status: Never Smoker  .  Smokeless tobacco: Never Used  . Alcohol use No     Comment: wine occassionally  . Drug use: No  . Sexual activity: Yes    Birth control/ protection: Condom   Other Topics Concern  . Not on file   Social History Narrative  . No narrative on file   Review of Systems Trying to diet--but very hard with her work schedule Weight up a few pounds Sleeps soundly--but not always refreshed Some back pain    Objective:   Physical Exam  Constitutional: She appears well-developed and well-nourished.  Neck: Normal range of motion. Neck supple. No thyromegaly present.  Cardiovascular: Normal rate, regular rhythm and normal heart sounds.  Exam reveals no gallop.   No murmur heard. Pulmonary/Chest: Effort normal  and breath sounds normal. No respiratory distress. She has no wheezes. She has no rales.  Abdominal: Soft. She exhibits no distension and no mass. There is no tenderness. There is no rebound and no guarding.  No HSM  Musculoskeletal: She exhibits no edema or tenderness.  Lymphadenopathy:    She has no cervical adenopathy.    She has no axillary adenopathy.       Right: No inguinal adenopathy present.       Left: No inguinal adenopathy present.  Psychiatric: She has a normal mood and affect. Her behavior is normal.          Assessment & Plan:

## 2015-09-22 NOTE — Assessment & Plan Note (Signed)
BP Readings from Last 3 Encounters:  09/22/15 138/80  07/15/15 (!) 164/92  06/18/15 (!) 144/96   Better I told her she should continue the HCTZ

## 2015-09-22 NOTE — Progress Notes (Signed)
Pre visit review using our clinic review tool, if applicable. No additional management support is needed unless otherwise documented below in the visit note. 

## 2015-09-22 NOTE — Assessment & Plan Note (Signed)
Hopefully still good control  will recheck labs

## 2015-09-23 LAB — CBC WITH DIFFERENTIAL/PLATELET
BASOS ABS: 0 10*3/uL (ref 0.0–0.2)
BASOS: 0 %
EOS (ABSOLUTE): 0.1 10*3/uL (ref 0.0–0.4)
Eos: 1 %
Hematocrit: 38.9 % (ref 34.0–46.6)
Hemoglobin: 13.5 g/dL (ref 11.1–15.9)
IMMATURE GRANS (ABS): 0 10*3/uL (ref 0.0–0.1)
Immature Granulocytes: 0 %
LYMPHS ABS: 2.3 10*3/uL (ref 0.7–3.1)
Lymphs: 39 %
MCH: 31 pg (ref 26.6–33.0)
MCHC: 34.7 g/dL (ref 31.5–35.7)
MCV: 89 fL (ref 79–97)
MONOS ABS: 0.4 10*3/uL (ref 0.1–0.9)
Monocytes: 6 %
NEUTROS ABS: 3.2 10*3/uL (ref 1.4–7.0)
Neutrophils: 54 %
PLATELETS: 239 10*3/uL (ref 150–379)
RBC: 4.35 x10E6/uL (ref 3.77–5.28)
RDW: 13.8 % (ref 12.3–15.4)
WBC: 6 10*3/uL (ref 3.4–10.8)

## 2015-09-23 LAB — COMPREHENSIVE METABOLIC PANEL
A/G RATIO: 1.7 (ref 1.2–2.2)
ALT: 26 IU/L (ref 0–32)
AST: 23 IU/L (ref 0–40)
Albumin: 4.3 g/dL (ref 3.5–5.5)
Alkaline Phosphatase: 89 IU/L (ref 39–117)
BUN / CREAT RATIO: 20 (ref 9–23)
BUN: 23 mg/dL (ref 6–24)
CHLORIDE: 101 mmol/L (ref 96–106)
CO2: 23 mmol/L (ref 18–29)
Calcium: 9.5 mg/dL (ref 8.7–10.2)
Creatinine, Ser: 1.13 mg/dL — ABNORMAL HIGH (ref 0.57–1.00)
GFR, EST AFRICAN AMERICAN: 62 mL/min/{1.73_m2} (ref >59)
GFR, EST NON AFRICAN AMERICAN: 54 mL/min/{1.73_m2} — AB (ref >59)
GLOBULIN, TOTAL: 2.6 g/dL (ref 1.5–4.5)
Glucose: 176 mg/dL — ABNORMAL HIGH (ref 65–99)
POTASSIUM: 4 mmol/L (ref 3.5–5.2)
SODIUM: 145 mmol/L — AB (ref 134–144)
TOTAL PROTEIN: 6.9 g/dL (ref 6.0–8.5)

## 2015-09-23 LAB — SEDIMENTATION RATE: SED RATE: 25 mm/h (ref 0–40)

## 2015-09-23 LAB — T4, FREE: Free T4: 1.42 ng/dL (ref 0.82–1.77)

## 2015-09-23 LAB — HEMOGLOBIN A1C
ESTIMATED AVERAGE GLUCOSE: 157 mg/dL
HEMOGLOBIN A1C: 7.1 % — AB (ref 4.8–5.6)

## 2015-09-30 ENCOUNTER — Encounter: Payer: Self-pay | Admitting: Emergency Medicine

## 2015-09-30 ENCOUNTER — Emergency Department
Admission: EM | Admit: 2015-09-30 | Discharge: 2015-09-30 | Disposition: A | Discharge status: Home / Self Care | Payer: Worker's Compensation | Attending: Emergency Medicine | Admitting: Emergency Medicine

## 2015-09-30 ENCOUNTER — Emergency Department: Payer: Worker's Compensation

## 2015-09-30 DIAGNOSIS — W208XXA Other cause of strike by thrown, projected or falling object, initial encounter: Secondary | ICD-10-CM | POA: Insufficient documentation

## 2015-09-30 DIAGNOSIS — Y939 Activity, unspecified: Secondary | ICD-10-CM | POA: Insufficient documentation

## 2015-09-30 DIAGNOSIS — E119 Type 2 diabetes mellitus without complications: Secondary | ICD-10-CM | POA: Insufficient documentation

## 2015-09-30 DIAGNOSIS — Y990 Civilian activity done for income or pay: Secondary | ICD-10-CM | POA: Insufficient documentation

## 2015-09-30 DIAGNOSIS — I10 Essential (primary) hypertension: Secondary | ICD-10-CM | POA: Insufficient documentation

## 2015-09-30 DIAGNOSIS — S0990XA Unspecified injury of head, initial encounter: Secondary | ICD-10-CM

## 2015-09-30 DIAGNOSIS — S0093XA Contusion of unspecified part of head, initial encounter: Secondary | ICD-10-CM | POA: Insufficient documentation

## 2015-09-30 DIAGNOSIS — Y929 Unspecified place or not applicable: Secondary | ICD-10-CM | POA: Diagnosis not present

## 2015-09-30 MED ORDER — OXYCODONE-ACETAMINOPHEN 5-325 MG PO TABS
1.0000 | ORAL_TABLET | Freq: Once | ORAL | Status: AC
  Administered 2015-09-30: 1 via ORAL
  Filled 2015-09-30: qty 1

## 2015-09-30 MED ORDER — OXYCODONE-ACETAMINOPHEN 5-325 MG PO TABS: 1.0000 | ORAL_TABLET | ORAL | 0 refills | Status: DC | PRN

## 2015-09-30 MED ORDER — IBUPROFEN 800 MG PO TABS
800.0000 mg | ORAL_TABLET | Freq: Once | ORAL | Status: AC
  Administered 2015-09-30: 800 mg via ORAL
  Filled 2015-09-30: qty 1

## 2015-09-30 NOTE — ED Triage Notes (Signed)
Pt was at work when a heavy piece of metal fell on her head.  Pt brought by EMS.  She has a small laceration on left top of head, and feels "dizzy-headed".  Pt reports pain on top of head and on forehead.

## 2015-09-30 NOTE — ED Provider Notes (Signed)
[facility] Emergency Department Provider Note  ____________________________________________  Time seen: Approximately 1:35 PM  I have reviewed the triage vital signs and the nursing notes.   HISTORY  Chief Complaint Head Injury    HPI Tami Jones is a 60 y.o. female presents for evaluation of head injury. Patient was at work when a heavy piece of metal fell on top of her head causing a laceration. Patient states that she feels dizzy headed since incident. Patient is reporting pain in the superior aspect of her head as well as in her forehead. She denies loss of consciousness. Past medical history significant for cerebrovascular accident. Describes her pain as 9/10 nonradiating.   Past Medical History:  Diagnosis Date  . Anxiety   . 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 Thad Rangereynolds  . Diabetes mellitus   . GERD (gastroesophageal reflux disease)   . Hyperlipidemia   . Hypertension     Patient Active Problem List   Diagnosis Date Noted  . Fatigue 09/22/2015  . Tick bite 07/15/2015  . Type 2 diabetes, controlled, with renal manifestation (HCC) 04/26/2015  . Cerebrovascular disease 07/17/2014  . Preventative health care 06/25/2014  . Right sided sciatica 03/08/2013  . MENOPAUSAL SYNDROME 09/10/2008  . Mood disorder (HCC) 09/15/2007  . Essential hypertension, benign 08/10/2006  . Diabetes mellitus type 2, controlled, without complications (HCC) 06/17/2006  . HYPERCHOLESTEROLEMIA 06/17/2006  . GERD 06/17/2006    Past Surgical History:  Procedure Laterality Date  . CARDIOVASCULAR STRESS TEST  1/15   myoview. EF 60%  . CESAREAN SECTION    . ESOPHAGOGASTRODUODENOSCOPY  05/2005  . TONSILLECTOMY AND ADENOIDECTOMY      Prior to Admission medications   Medication Sig Start Date End Date Taking? Authorizing Provider  albuterol (PROVENTIL HFA;VENTOLIN HFA) 108 (90 BASE) MCG/ACT inhaler Inhale 2 puffs into the lungs  every 6 (six) hours as needed for wheezing or shortness of breath. 01/28/15   Phineas SemenGraydon Goodman, MD  aspirin 81 MG tablet Take 81 mg by mouth daily.      Historical Provider, MD  atorvastatin (LIPITOR) 20 MG tablet Take 1 tablet (20 mg total) by mouth daily. 03/19/14   Karie Schwalbeichard I Letvak, MD  clonazePAM (KLONOPIN) 0.5 MG tablet Take 1 tablet (0.5 mg total) by mouth at bedtime. 09/04/15   Karie Schwalbeichard I Letvak, MD  hydrochlorothiazide (HYDRODIURIL) 25 MG tablet TAKE ONE TABLET BY MOUTH ONCE DAILY 09/03/15   Vesta MixerPhilip J Nahser, MD  lisinopril (PRINIVIL,ZESTRIL) 20 MG tablet Take 1 tablet (20 mg total) by mouth daily. 04/25/15   Karie Schwalbeichard I Letvak, MD  metoprolol succinate (TOPROL-XL) 100 MG 24 hr tablet Take 1 tablet by mouth  daily with or immediately  following a meal 07/30/15   Karie Schwalbeichard I Letvak, MD  omeprazole (PRILOSEC) 20 MG capsule Take 1 capsule (20 mg total) by mouth daily. 12/11/14   Karie Schwalbeichard I Letvak, MD  oxyCODONE-acetaminophen (ROXICET) 5-325 MG tablet Take 1-2 tablets by mouth every 4 (four) hours as needed for severe pain. 09/30/15   Evangeline Dakinharles M Beers, PA-C    Allergies Citalopram; Doxycycline hyclate; Erythromycin; Montelukast sodium; Nitrofurantoin; Sulfa antibiotics; Tramadol hcl; Venlafaxine hcl; and Amoxicillin-pot clavulanate  Family History  Problem Relation Age of Onset  . Coronary artery disease Mother   . Diabetes Mellitus II Mother   . Heart attack Mother [id number]  . Diabetes Mellitus II Maternal Grandmother     Social History Social History  Substance Use Topics  . Smoking status: Never Smoker  .  Smokeless tobacco: Never Used  . Alcohol use No     Comment: wine occassionally    Review of Systems Constitutional: No fever/chills Eyes: No visual changes. ENT: No sore throat. Cardiovascular: Denies chest pain. Respiratory: Denies shortness of breath. Gastrointestinal: No abdominal pain.  No nausea, no vomiting.  No diarrhea.  No constipation. Genitourinary: Negative for  dysuria. Musculoskeletal: Negative for back pain. Skin: Positive for superficial laceration to the scalp. Neurological: Positive for headaches, denies focal weakness or numbness.  10-point ROS otherwise negative.  ____________________________________________   PHYSICAL EXAM:  VITAL SIGNS: ED Triage Vitals  Enc Vitals Group     BP 09/30/15 1251 (!) 184/85     Pulse Rate 09/30/15 1251 65     Resp 09/30/15 1251 16     Temp 09/30/15 1251 98.2 F (36.8 C)     Temp Source 09/30/15 1251 Oral     SpO2 09/30/15 1251 100 %     Weight 09/30/15 1253 235 lb (106.6 kg)     Height 09/30/15 1253 5\' 4"  (1.626 m)     Head Circumference --      Peak Flow --      Pain Score 09/30/15 1302 9     Pain Loc --      Pain Edu? --      Excl. in GC? --     Constitutional: Alert and oriented. Well appearing and in no acute distress. Eyes: Conjunctivae are normal. PERRL. EOMI. Head: Medical with laceration noted to the exception region of the scalp. Bleeding controlled at this time. Neck: No stridor. Supple, full range of motion. Nontender.  Cardiovascular: Normal rate, regular rhythm. Grossly normal heart sounds.  Good peripheral circulation. Respiratory: Normal respiratory effort.  No retractions. Lungs CTAB. Musculoskeletal: No lower extremity tenderness nor edema.  No joint effusions. Neurologic:  Normal speech and language. No gross focal neurologic deficits are appreciated. No gait instability. Skin:  Skin is warm, dry and intact. No rash noted. Psychiatric: Mood and affect are normal. Speech and behavior are normal.  ____________________________________________   LABS (all labs ordered are listed, but only abnormal results are displayed)  Labs Reviewed - No data to display ____________________________________________  EKG   ____________________________________________  RADIOLOGY  Head CT unremarkable. ____________________________________________   PROCEDURES  Procedure(s)  performed: None  Critical Care performed: No  ____________________________________________   INITIAL IMPRESSION / ASSESSMENT AND PLAN / ED COURSE  Pertinent labs & imaging results that were available during my care of the patient were reviewed by me and considered in my medical decision making (see chart for details). Review of the [place] CSRS was performed in accordance of the [facility] prior to dispensing any controlled drugs.  Head injury. Rx given for Percocet 5/325. Patient follow-up PCP or return to the ER with  any new or developing symptomology. Work excuse 24 hours given.  Clinical Course    ____________________________________________   FINAL CLINICAL IMPRESSION(S) / ED DIAGNOSES  Final diagnoses:  Head injury, initial encounter  Head contusion, initial encounter  Minor head injury without loss of consciousness, initial encounter     This chart was dictated using voice recognition software/Dragon. Despite best efforts to proofread, errors can occur which can change the meaning. Any change was purely unintentional.    Evangeline Dakinharles M Beers, PA-C 09/30/15 1540    Governor Rooksebecca Lord, MD 09/30/15 [phone]

## 2015-10-07 ENCOUNTER — Emergency Department: Payer: Worker's Compensation

## 2015-10-07 ENCOUNTER — Encounter: Payer: Self-pay | Admitting: Emergency Medicine

## 2015-10-07 ENCOUNTER — Emergency Department
Admission: EM | Admit: 2015-10-07 | Discharge: 2015-10-07 | Disposition: A | Discharge status: Home / Self Care | Payer: Worker's Compensation | Attending: Emergency Medicine | Admitting: Emergency Medicine

## 2015-10-07 DIAGNOSIS — Z7951 Long term (current) use of inhaled steroids: Secondary | ICD-10-CM | POA: Insufficient documentation

## 2015-10-07 DIAGNOSIS — E119 Type 2 diabetes mellitus without complications: Secondary | ICD-10-CM | POA: Insufficient documentation

## 2015-10-07 DIAGNOSIS — Y929 Unspecified place or not applicable: Secondary | ICD-10-CM | POA: Diagnosis not present

## 2015-10-07 DIAGNOSIS — I10 Essential (primary) hypertension: Secondary | ICD-10-CM | POA: Diagnosis not present

## 2015-10-07 DIAGNOSIS — S0990XD Unspecified injury of head, subsequent encounter: Secondary | ICD-10-CM | POA: Diagnosis not present

## 2015-10-07 DIAGNOSIS — Y990 Civilian activity done for income or pay: Secondary | ICD-10-CM | POA: Diagnosis not present

## 2015-10-07 DIAGNOSIS — Y939 Activity, unspecified: Secondary | ICD-10-CM | POA: Diagnosis not present

## 2015-10-07 DIAGNOSIS — Z7982 Long term (current) use of aspirin: Secondary | ICD-10-CM | POA: Insufficient documentation

## 2015-10-07 DIAGNOSIS — W208XXD Other cause of strike by thrown, projected or falling object, subsequent encounter: Secondary | ICD-10-CM | POA: Diagnosis not present

## 2015-10-07 DIAGNOSIS — R519 Headache, unspecified: Secondary | ICD-10-CM

## 2015-10-07 DIAGNOSIS — R51 Headache: Secondary | ICD-10-CM | POA: Insufficient documentation

## 2015-10-07 MED ORDER — BUTALBITAL-APAP-CAFFEINE 50-325-40 MG PO TABS: 1.0000 | ORAL_TABLET | Freq: Four times a day (QID) | ORAL | 0 refills | Status: DC | PRN

## 2015-10-07 NOTE — ED Provider Notes (Signed)
[facility] Emergency Department Provider Note  ____________________________________________  Time seen: Approximately 3:49 PM  I have reviewed the triage vital signs and the nursing notes.   HISTORY  Chief Complaint Head Injury    HPI Tami Jones is a 60 y.o. female was seen by myself one week ago with a head injury. See note dated 10/02/2015 for complete details. Patient reports continuing to have a headache from her head injury. Patient denies any photophobia or visual changes. [facility]   Past Medical History:  Diagnosis Date  . Anxiety   . 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 Thad Rangereynolds  . Diabetes mellitus   . GERD (gastroesophageal reflux disease)   . Hyperlipidemia   . Hypertension     Patient Active Problem List   Diagnosis Date Noted  . Fatigue 09/22/2015  . Tick bite 07/15/2015  . Type 2 diabetes, controlled, with renal manifestation (HCC) 04/26/2015  . Cerebrovascular disease 07/17/2014  . Preventative health care 06/25/2014  . Right sided sciatica 03/08/2013  . MENOPAUSAL SYNDROME 09/10/2008  . Mood disorder (HCC) 09/15/2007  . Essential hypertension, benign 08/10/2006  . Diabetes mellitus type 2, controlled, without complications (HCC) 06/17/2006  . HYPERCHOLESTEROLEMIA 06/17/2006  . GERD 06/17/2006    Past Surgical History:  Procedure Laterality Date  . CARDIOVASCULAR STRESS TEST  1/15   myoview. EF 60%  . CESAREAN SECTION    . ESOPHAGOGASTRODUODENOSCOPY  05/2005  . TONSILLECTOMY AND ADENOIDECTOMY      Prior to Admission medications   Medication Sig Start Date End Date Taking? Authorizing Provider  albuterol (PROVENTIL HFA;VENTOLIN HFA) 108 (90 BASE) MCG/ACT inhaler Inhale 2 puffs into the lungs every 6 (six) hours as needed for wheezing or shortness of breath. 01/28/15   Phineas SemenGraydon Goodman, MD  aspirin 81 MG tablet Take 81 mg by  mouth daily.      Historical Provider, MD  atorvastatin (LIPITOR) 20 MG tablet Take 1 tablet (20 mg total) by mouth daily. 03/19/14   Karie Schwalbeichard I Letvak, MD  butalbital-acetaminophen-caffeine (FIORICET) [phone] MG tablet Take 1-2 tablets by mouth every 6 (six) hours as needed for headache. 10/07/15   Evangeline Dakinharles M Elenore Wanninger, PA-C  clonazePAM (KLONOPIN) 0.5 MG tablet Take 1 tablet (0.5 mg total) by mouth at bedtime. 09/04/15   Karie Schwalbeichard I Letvak, MD  hydrochlorothiazide (HYDRODIURIL) 25 MG tablet TAKE ONE TABLET BY MOUTH ONCE DAILY 09/03/15   Vesta MixerPhilip J Nahser, MD  lisinopril (PRINIVIL,ZESTRIL) 20 MG tablet Take 1 tablet (20 mg total) by mouth daily. 04/25/15   Karie Schwalbeichard I Letvak, MD  metoprolol succinate (TOPROL-XL) 100 MG 24 hr tablet Take 1 tablet by mouth  daily with or immediately  following a meal 07/30/15   Karie Schwalbeichard I Letvak, MD  omeprazole (PRILOSEC) 20 MG capsule Take 1 capsule (20 mg total) by mouth daily. 12/11/14   Karie Schwalbeichard I Letvak, MD  oxyCODONE-acetaminophen (ROXICET) 5-325 MG tablet Take 1-2 tablets by mouth every 4 (four) hours as needed for severe pain. 09/30/15   Evangeline Dakinharles M Karlin Heilman, PA-C    Allergies Citalopram; Doxycycline hyclate; Erythromycin; Montelukast sodium; Nitrofurantoin; Sulfa antibiotics; Tramadol hcl; Venlafaxine hcl; and Amoxicillin-pot clavulanate  Family History  Problem Relation Age of Onset  . Coronary artery disease Mother   . Diabetes Mellitus II Mother   . Heart attack Mother [id number]  . Diabetes Mellitus II Maternal Grandmother     Social History Social History  Substance Use Topics  . Smoking  status: Never Smoker  . Smokeless tobacco: Never Used  . Alcohol use No     Comment: wine occassionally    Review of Systems Constitutional: No fever/chills Eyes: No visual changes. ENT: No sore throat. Cardiovascular: Denies chest pain. Respiratory: Denies shortness of breath. Musculoskeletal: Negative for back pain. Skin: Negative for rash. Neurological: Negative for headaches,  focal weakness or numbness.  10-point ROS otherwise negative.  ____________________________________________   PHYSICAL EXAM:  VITAL SIGNS: ED Triage Vitals [10/07/15 1515]  Enc Vitals Group     BP (!) 186/91     Pulse Rate 70     Resp 17     Temp 97.9 F (36.6 C)     Temp Source Oral     SpO2 100 %     Weight 235 lb (106.6 kg)     Height 5\' 4"  (1.626 m)     Head Circumference      Peak Flow      Pain Score 7     Pain Loc      Pain Edu?      Excl. in GC?     Constitutional: Alert and oriented. Well appearing and in no acute distress. Eyes: Conjunctivae are normal. PERRL. EOMI. Head: Atraumatic.  Neck: No stridor.   Cardiovascular: Normal rate, regular rhythm. Grossly normal heart sounds.  Good peripheral circulation. Respiratory: Normal respiratory effort.  No retractions. Lungs CTAB. Musculoskeletal: No lower extremity tenderness nor edema.  No joint effusions. Neurologic:  Normal speech and language. No gross focal neurologic deficits are appreciated. No gait instability. Skin:  Skin is warm, dry and intact. No rash noted. Psychiatric: Mood and affect are normal. Speech and behavior are normal.  ____________________________________________   LABS (all labs ordered are listed, but only abnormal results are displayed)  Labs Reviewed - No data to display ____________________________________________  EKG   ____________________________________________  RADIOLOGY  IMPRESSION:  No acute abnormality and no change from the prior study.    Chronic infarcts in the occipital lobes bilaterally. Chronic  bilateral caudate infarcts.    ____________________________________________   PROCEDURES  Procedure(s) performed: None  Critical Care performed: No  ____________________________________________   INITIAL IMPRESSION / ASSESSMENT AND PLAN / ED COURSE  Pertinent labs & imaging results that were available during my care of the patient were reviewed by me  and considered in my medical decision making (see chart for details). Review of the [place] CSRS was performed in accordance of the [facility] prior to dispensing any controlled drugs.  Head injury with continued headaches. Patient started on Fioricet as needed for headache she is to follow-up with her PCP to get her blood pressure under control. Denies any other complaints at this time.  Clinical Course    ____________________________________________   FINAL CLINICAL IMPRESSION(S) / ED DIAGNOSES  Final diagnoses:  Head injury, subsequent encounter  Persistent headaches     This chart was dictated using voice recognition software/Dragon. Despite best efforts to proofread, errors can occur which can change the meaning. Any change was purely unintentional.    Evangeline Dakinharles M Alaena Strader, PA-C 10/07/15 1726    Loleta Roseory Forbach, MD 10/07/15 [phone]

## 2015-10-07 NOTE — ED Triage Notes (Signed)
Pt was sent here after f/u at PCP.

## 2015-10-07 NOTE — ED Triage Notes (Signed)
Pt reports had a work accident last week, a piece of heavy metal fell on her head. Was seen here. Pt reports continued headaches and blurred vision.

## 2015-10-07 NOTE — ED Notes (Signed)
See triage note   Was seen last week for head injury. Had a piece of metal hit her in the head  conts to have head ..Marland Kitchen

## 2015-10-09 ENCOUNTER — Encounter: Payer: Self-pay | Admitting: Internal Medicine

## 2015-10-09 ENCOUNTER — Ambulatory Visit (INDEPENDENT_AMBULATORY_CARE_PROVIDER_SITE_OTHER): Payer: 59 | Admitting: Internal Medicine

## 2015-10-09 DIAGNOSIS — S060X0A Concussion without loss of consciousness, initial encounter: Secondary | ICD-10-CM | POA: Diagnosis not present

## 2015-10-09 DIAGNOSIS — F0781 Postconcussional syndrome: Secondary | ICD-10-CM | POA: Insufficient documentation

## 2015-10-09 NOTE — Assessment & Plan Note (Signed)
Ongoing symptoms Should not return to work till headache and blurred vision better Discussed brain rest--- info given Will set up with Dr Patsy Lageropland for reevaluation of fitness to return to work -- 8/28

## 2015-10-09 NOTE — Patient Instructions (Signed)
Concussion, Adult  A concussion, or closed-head injury, is a brain injury caused by a direct blow to the head or by a quick and sudden movement (jolt) of the head or neck. Concussions are usually not life-threatening. Even so, the effects of a concussion can be serious. If you have had a concussion before, you are more likely to experience concussion-like symptoms after a direct blow to the head.   CAUSES  · Direct blow to the head, such as from running into another player during a soccer game, being hit in a fight, or hitting your head on a hard surface.  · A jolt of the head or neck that causes the brain to move back and forth inside the skull, such as in a car crash.  SIGNS AND SYMPTOMS  The signs of a concussion can be hard to notice. Early on, they may be missed by you, family members, and health care providers. You may look fine but act or feel differently.  Symptoms are usually temporary, but they may last for days, weeks, or even longer. Some symptoms may appear right away while others may not show up for hours or days. Every head injury is different. Symptoms include:  · Mild to moderate headaches that will not go away.  · A feeling of pressure inside your head.  · Having more trouble than usual:    Learning or remembering things you have heard.    Answering questions.    Paying attention or concentrating.    Organizing daily tasks.    Making decisions and solving problems.  · Slowness in thinking, acting or reacting, speaking, or reading.  · Getting lost or being easily confused.  · Feeling tired all the time or lacking energy (fatigued).  · Feeling drowsy.  · Sleep disturbances.    Sleeping more than usual.    Sleeping less than usual.    Trouble falling asleep.    Trouble sleeping (insomnia).  · Loss of balance or feeling lightheaded or dizzy.  · Nausea or vomiting.  · Numbness or tingling.  · Increased sensitivity to:    Sounds.    Lights.    Distractions.  · Vision problems or eyes that tire  easily.  · Diminished sense of taste or smell.  · Ringing in the ears.  · Mood changes such as feeling sad or anxious.  · Becoming easily irritated or angry for little or no reason.  · Lack of motivation.  · Seeing or hearing things other people do not see or hear (hallucinations).  DIAGNOSIS  Your health care provider can usually diagnose a concussion based on a description of your injury and symptoms. He or she will ask whether you passed out (lost consciousness) and whether you are having trouble remembering events that happened right before and during your injury.  Your evaluation might include:  · A brain scan to look for signs of injury to the brain. Even if the test shows no injury, you may still have a concussion.  · Blood tests to be sure other problems are not present.  TREATMENT  · Concussions are usually treated in an emergency department, in urgent care, or at a clinic. You may need to stay in the hospital overnight for further treatment.  · Tell your health care provider if you are taking any medicines, including prescription medicines, over-the-counter medicines, and natural remedies. Some medicines, such as blood thinners (anticoagulants) and aspirin, may increase the chance of complications. Also tell your health care   provider whether you have had alcohol or are taking illegal drugs. This information may affect treatment.  · Your health care provider will send you home with important instructions to follow.  · How fast you will recover from a concussion depends on many factors. These factors include how severe your concussion is, what part of your brain was injured, your age, and how healthy you were before the concussion.  · Most people with mild injuries recover fully. Recovery can take time. In general, recovery is slower in older persons. Also, persons who have had a concussion in the past or have other medical problems may find that it takes longer to recover from their current injury.  HOME  CARE INSTRUCTIONS  General Instructions  · Carefully follow the directions your health care provider gave you.  · Only take over-the-counter or prescription medicines for pain, discomfort, or fever as directed by your health care provider.  · Take only those medicines that your health care provider has approved.  · Do not drink alcohol until your health care provider says you are well enough to do so. Alcohol and certain other drugs may slow your recovery and can put you at risk of further injury.  · If it is harder than usual to remember things, write them down.  · If you are easily distracted, try to do one thing at a time. For example, do not try to watch TV while fixing dinner.  · Talk with family members or close friends when making important decisions.  · Keep all follow-up appointments. Repeated evaluation of your symptoms is recommended for your recovery.  · Watch your symptoms and tell others to do the same. Complications sometimes occur after a concussion. Older adults with a brain injury may have a higher risk of serious complications, such as a blood clot on the brain.  · Tell your teachers, school nurse, school counselor, coach, athletic trainer, or work manager about your injury, symptoms, and restrictions. Tell them about what you can or cannot do. They should watch for:    Increased problems with attention or concentration.    Increased difficulty remembering or learning new information.    Increased time needed to complete tasks or assignments.    Increased irritability or decreased ability to cope with stress.    Increased symptoms.  · Rest. Rest helps the brain to heal. Make sure you:    Get plenty of sleep at night. Avoid staying up late at night.    Keep the same bedtime hours on weekends and weekdays.    Rest during the day. Take daytime naps or rest breaks when you feel tired.  · Limit activities that require a lot of thought or concentration. These include:    Doing homework or job-related  work.    Watching TV.    Working on the computer.  · Avoid any situation where there is potential for another head injury (football, hockey, soccer, basketball, martial arts, downhill snow sports and horseback riding). Your condition will get worse every time you experience a concussion. You should avoid these activities until you are evaluated by the appropriate follow-up health care providers.  Returning To Your Regular Activities  You will need to return to your normal activities slowly, not all at once. You must give your body and brain enough time for recovery.  · Do not return to sports or other athletic activities until your health care provider tells you it is safe to do so.  · Ask   your health care provider when you can drive, ride a bicycle, or operate heavy machinery. Your ability to react may be slower after a brain injury. Never do these activities if you are dizzy.  · Ask your health care provider about when you can return to work or school.  Preventing Another Concussion  It is very important to avoid another brain injury, especially before you have recovered. In rare cases, another injury can lead to permanent brain damage, brain swelling, or death. The risk of this is greatest during the first 7-10 days after a head injury. Avoid injuries by:  · Wearing a seat belt when riding in a car.  · Drinking alcohol only in moderation.  · Wearing a helmet when biking, skiing, skateboarding, skating, or doing similar activities.  · Avoiding activities that could lead to a second concussion, such as contact or recreational sports, until your health care provider says it is okay.  · Taking safety measures in your home.    Remove clutter and tripping hazards from floors and stairways.    Use grab bars in bathrooms and handrails by stairs.    Place non-slip mats on floors and in bathtubs.    Improve lighting in dim areas.  SEEK MEDICAL CARE IF:  · You have increased problems paying attention or  concentrating.  · You have increased difficulty remembering or learning new information.  · You need more time to complete tasks or assignments than before.  · You have increased irritability or decreased ability to cope with stress.  · You have more symptoms than before.  Seek medical care if you have any of the following symptoms for more than 2 weeks after your injury:  · Lasting (chronic) headaches.  · Dizziness or balance problems.  · Nausea.  · Vision problems.  · Increased sensitivity to noise or light.  · Depression or mood swings.  · Anxiety or irritability.  · Memory problems.  · Difficulty concentrating or paying attention.  · Sleep problems.  · Feeling tired all the time.  SEEK IMMEDIATE MEDICAL CARE IF:  · You have severe or worsening headaches. These may be a sign of a blood clot in the brain.  · You have weakness (even if only in one hand, leg, or part of the face).  · You have numbness.  · You have decreased coordination.  · You vomit repeatedly.  · You have increased sleepiness.  · One pupil is larger than the other.  · You have convulsions.  · You have slurred speech.  · You have increased confusion. This may be a sign of a blood clot in the brain.  · You have increased restlessness, agitation, or irritability.  · You are unable to recognize people or places.  · You have neck pain.  · It is difficult to wake you up.  · You have unusual behavior changes.  · You lose consciousness.  MAKE SURE YOU:  · Understand these instructions.  · Will watch your condition.  · Will get help right away if you are not doing well or get worse.     This information is not intended to replace advice given to you by your health care provider. Make sure you discuss any questions you have with your health care provider.     Document Released: 04/24/2003 Document Revised: 02/22/2014 Document Reviewed: 08/24/2012  Elsevier Interactive Patient Education ©2016 [employer]vier Inc[employer]

## 2015-10-09 NOTE — Progress Notes (Signed)
Subjective:    Patient ID: Tami Jones, female    DOB: 15-Jun-1955, 60 y.o.   MRN: [id number]  HPI Here for ER follow up after head injury  8/15 --was at work Piece of metal across top of wide double refrigerator fell off and hit her head (in lab) Broke skin and she was bleeding ?40#    Seen in [facility] ER Then back again 2 days ago due to persistent headaches Records reviewed Went to occupational health at [facility] 3 days ago--has been out of work  Still with headache Blurred vision Not much better---but worsens at times Some symptoms at night Hasn't taken the meds given (and didn't want the oxycodone) Lights and noise make her pain worse Some trouble thinking and concentrating Movement makes it worse  Current Outpatient Prescriptions on File Prior to Visit  Medication Sig Dispense Refill  . albuterol (PROVENTIL HFA;VENTOLIN HFA) 108 (90 BASE) MCG/ACT inhaler Inhale 2 puffs into the lungs every 6 (six) hours as needed for wheezing or shortness of breath. 1 Inhaler 0  . aspirin 81 MG tablet Take 81 mg by mouth daily.      Marland Kitchen atorvastatin (LIPITOR) 20 MG tablet Take 1 tablet (20 mg total) by mouth daily. 90 tablet 3  . butalbital-acetaminophen-caffeine (FIORICET) 50-325-40 MG tablet Take 1-2 tablets by mouth every 6 (six) hours as needed for headache. 20 tablet 0  . clonazePAM (KLONOPIN) 0.5 MG tablet Take 1 tablet (0.5 mg total) by mouth at bedtime. 90 tablet 0  . hydrochlorothiazide (HYDRODIURIL) 25 MG tablet TAKE ONE TABLET BY MOUTH ONCE DAILY 30 tablet 0  . lisinopril (PRINIVIL,ZESTRIL) 20 MG tablet Take 1 tablet (20 mg total) by mouth daily. 90 tablet 3  . metoprolol succinate (TOPROL-XL) 100 MG 24 hr tablet Take 1 tablet by mouth  daily with or immediately  following a meal 90 tablet 0  . omeprazole (PRILOSEC) 20 MG capsule Take 1 capsule (20 mg total) by mouth daily. 30 capsule 11   No current facility-administered medications on file prior to visit.     Allergies    Allergen Reactions  . Citalopram Other (See Comments)    intolerant  . Doxycycline Hyclate Other (See Comments)    REACTION: stomach upset  . Erythromycin   . Montelukast Sodium Other (See Comments)    REACTION: unspecified  . Nitrofurantoin Other (See Comments)    REACTION: unspecified  . Sulfa Antibiotics Nausea Only  . Tramadol Hcl Other (See Comments)    REACTION: Can not tolerate  . Venlafaxine Hcl Other (See Comments)    Intolerant  . Amoxicillin-Pot Clavulanate Nausea Only    Past Medical History:  Diagnosis Date  . Anxiety   . 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 Thad Ranger  . Diabetes mellitus   . GERD (gastroesophageal reflux disease)   . Hyperlipidemia   . Hypertension     Past Surgical History:  Procedure Laterality Date  . CARDIOVASCULAR STRESS TEST  1/15   myoview. EF 60%  . CESAREAN SECTION    . ESOPHAGOGASTRODUODENOSCOPY  05/2005  . TONSILLECTOMY AND ADENOIDECTOMY      Family History  Problem Relation Age of Onset  . Coronary artery disease Mother   . Diabetes Mellitus II Mother   . Heart attack Mother 65  . Diabetes Mellitus II Maternal Grandmother     Social History   Social History  . Marital status: Divorced    Spouse name: N/A  . Number of children:  2  . 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 Smoking status: Never Smoker  . Smokeless tobacco: Never Used  . Alcohol use No     Comment: wine occassionally  . Drug use: No  . Sexual activity: Yes    Birth control/ protection: Condom   Other Topics Concern  . Not on file   Social History Narrative  . No narrative on file   Review of Systems Slight ringing in left ear yesterday No diplopia No arm or leg weakness--just feels tired Able to sleep okay Appetite is off today    Objective:   Physical Exam  Constitutional: She is oriented to person, place, and time.  HENT:  Small  granulated area in occiput  Neurological: She is alert and oriented to person, place, and time. She has normal strength. No cranial nerve deficit. She exhibits normal muscle tone. She displays a negative Romberg sign. Coordination and gait normal.  Slight unsteadiness with walking --but not ataxic  Psychiatric:  Anxious, uncomfortable          Assessment & Plan:

## 2015-10-09 NOTE — Progress Notes (Signed)
Pre visit review using our clinic review tool, if applicable. No additional management support is needed unless otherwise documented below in the visit note. 

## 2015-10-13 ENCOUNTER — Telehealth: Payer: Self-pay

## 2015-10-13 ENCOUNTER — Ambulatory Visit: Payer: Self-pay | Admitting: Family Medicine

## 2015-10-13 NOTE — Telephone Encounter (Signed)
If she rescheduled-- I cannot extend her disability past tomorrow as we discussed last week. Today' appt was necessary to be able to reevaluate need for time off work

## 2015-10-13 NOTE — Telephone Encounter (Signed)
Pt left v/m; pt has appt with Dr Patsy Lageropland on 10/15/15; pt wants to know if should stay out of work until sees Dr Patsy Lageropland.pt request cb.

## 2015-10-13 NOTE — Telephone Encounter (Signed)
If her headaches are better--she can go back to work and see how she does

## 2015-10-13 NOTE — Telephone Encounter (Signed)
Spoke to pt. She said her headaches are getting better if she rests. She will plan to go back to work tomorrow. She had to reschedule her appt with Dr Patsy Lageropland due to her daughter having a doctor's appt today at the same time. Her daughter would not have been able to bring her today.

## 2015-10-13 NOTE — Telephone Encounter (Signed)
Our systems says pt cancelled her appointment with Dr Patsy Lageropland today and rescheduled to Wednesday.

## 2015-10-13 NOTE — Telephone Encounter (Signed)
She can return if the headache is gone--otherwise, wait until her visit

## 2015-10-14 ENCOUNTER — Ambulatory Visit: Payer: Self-pay | Admitting: Internal Medicine

## 2015-10-15 ENCOUNTER — Encounter: Payer: Self-pay | Admitting: Family Medicine

## 2015-10-15 ENCOUNTER — Ambulatory Visit (INDEPENDENT_AMBULATORY_CARE_PROVIDER_SITE_OTHER): Payer: Self-pay | Admitting: Family Medicine

## 2015-10-15 VITALS — BP 138/80 | HR 71 | Temp 98.3°F | Ht 63.0 in | Wt 231.8 lb

## 2015-10-15 DIAGNOSIS — F0781 Postconcussional syndrome: Secondary | ICD-10-CM

## 2015-10-15 DIAGNOSIS — S060X0D Concussion without loss of consciousness, subsequent encounter: Secondary | ICD-10-CM

## 2015-10-15 MED ORDER — AMITRIPTYLINE HCL 10 MG PO TABS: ORAL_TABLET | ORAL | 2 refills | Status: DC

## 2015-10-15 NOTE — Progress Notes (Signed)
Pre visit review using our clinic review tool, if applicable. No additional management support is needed unless otherwise documented below in the visit note. 

## 2015-10-15 NOTE — Telephone Encounter (Signed)
Spoke with pt regarding our previous conversation at registration.  We will be able to see her as a self pay patient.  She agreed and would like to continue as a self pay patient, even though she could continue at [employer] as workers comp.  We will provide her with reciepts for her deposit at each visit.  Patient also understands we will not be able to file with her private insurance as the case is ongoing worker's comp.  Patient expressed understanding.

## 2015-10-15 NOTE — Progress Notes (Signed)
Dr. Karleen Hampshire T. Ashritha Desrosiers, MD, CAQ Sports [facility] [address] [place], [address] Phone: [phone] Fax: [phone]  10/15/2015  Patient: Tami Jones, MRN: [id number], DOB: 1955-10-12, 60 y.o.  Primary Physician:  Tillman Abide, MD   Chief Complaint  Patient presents with  . Concussion   Subjective:   Tami Jones is a 60 y.o. very pleasant female patient who presents with the following:  DOI 09/30/2015  Big piece of metal fell on her head. Opened door and getting some things out and it hit her head and fell into the head. Got some bleeding.   Out of work for 24 hours, went back to work - had to leave.  Drove herself back to work - severe headache and did not feel right.   Went to [employer] MD, went back to the MD at the ER also.  Repeated CT scan, again no ICH or Fracture.   Went back to work on Tuesday. Still slow.  10/09/2015 - saw Dr. Alphonsus Sias who recommended rest and follow-up with me.   ACE Concussion evaluation reviewed in full. The patient remains somewhat forgetful and appears flattened or mildly dazed on general exam. She continues to have a headache, as well as problems with dizziness, intermittent blurred vision, increased fatigue, sensitivity to light, sensitivity to noise. She also is feeling cognitively slowed, she is having difficulty concentrating.  Emotionally, the patient is more irritable, and she also feels more nervous.  From a sleep standpoint, she is sleeping less than normal, and she is having difficulty following asleep.  Cognitive exertion has worsened her symptoms.  She has no history of concussion that is known. And noted significant history of headaches such as migraine. She does have a history of a mood disorder and is taking Klonopin.  Past Medical History, Surgical History, Social History, Family History, Problem List, Medications, and Allergies have been reviewed and updated if  relevant.  Patient Active Problem List   Diagnosis Date Noted  . Concussion with no loss of consciousness 10/09/2015  . Fatigue 09/22/2015  . Tick bite 07/15/2015  . Type 2 diabetes, controlled, with renal manifestation (HCC) 04/26/2015  . Cerebrovascular disease 07/17/2014  . Preventative health care 06/25/2014  . Right sided sciatica 03/08/2013  . MENOPAUSAL SYNDROME 09/10/2008  . Mood disorder (HCC) 09/15/2007  . Essential hypertension, benign 08/10/2006  . Diabetes mellitus type 2, controlled, without complications (HCC) 06/17/2006  . HYPERCHOLESTEROLEMIA 06/17/2006  . GERD 06/17/2006    Past Medical History:  Diagnosis Date  . Anxiety   . 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 Thad Ranger  . Diabetes mellitus   . GERD (gastroesophageal reflux disease)   . Hyperlipidemia   . Hypertension     Past Surgical History:  Procedure Laterality Date  . CARDIOVASCULAR STRESS TEST  1/15   myoview. EF 60%  . CESAREAN SECTION    . ESOPHAGOGASTRODUODENOSCOPY  05/2005  . TONSILLECTOMY AND ADENOIDECTOMY      Social History   Social History  . Marital status: Divorced    Spouse name: N/A  . Number of children: 2  . 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 Smoking status: Never Smoker  . Smokeless tobacco: Never Used  . Alcohol use No     Comment: wine occassionally  . Drug use: No  . Sexual activity: Yes  Birth control/ protection: Condom   Other Topics Concern  . Not on file   Social History Narrative  . No narrative on file    Family History  Problem Relation Age of Onset  . Coronary artery disease Mother   . Diabetes Mellitus II Mother   . Heart attack Mother 58  . Diabetes Mellitus II Maternal Grandmother     Allergies  Allergen Reactions  . Citalopram Other (See Comments)    intolerant  . Doxycycline Hyclate Other (See Comments)    REACTION: stomach upset  .  Erythromycin   . Montelukast Sodium Other (See Comments)    REACTION: unspecified  . Nitrofurantoin Other (See Comments)    REACTION: unspecified  . Sulfa Antibiotics Nausea Only  . Tramadol Hcl Other (See Comments)    REACTION: Can not tolerate  . Venlafaxine Hcl Other (See Comments)    Intolerant  . Amoxicillin-Pot Clavulanate Nausea Only    Medication list reviewed and updated in full in [facility].   GEN: No acute illnesses, no fevers, chills. GI: No n/v/d, eating normally Pulm: No SOB Interactive and getting along well at home.  Otherwise, ROS is as per the HPI.  Objective:   BP 138/80   Pulse 71   Temp 98.3 F (36.8 C) (Oral)   Ht 5\' 3"  (1.6 m)   Wt 231 lb 12 oz (105.1 kg)   BMI 41.05 kg/m    GEN: WDWN, NAD, Non-toxic, A & O x 3 HEENT: Atraumatic, Normocephalic. Neck supple. No masses, No LAD. Ears and Nose: No external deformity. CV: RRR, No M/G/R. No JVD. No thrill. No extra heart sounds. PULM: CTA B, no wheezes, crackles, rhonchi. No retractions. No resp. distress. No accessory muscle use. ABD: S, NT, ND, +BS. No rebound tenderness. No HSM.  EXTR: No c/c/e  Neuro: CN 2-12 grossly intact. PERRLA. EOMI. Sensation intact throughout. Str 5/5 all extremities. DTR 2+. No clonus. A and o x 4. Romberg neg. Finger nose neg. Heel -shin UNABLE TO COMPLETE  BESS TESTING ON 1 FOOT AND TANDEM, UNABLE TO COMPLETE - DIFFICULT WITH STANDING AND UNABLE TO COMPLETE WITH EYES SHUT  PSYCH: FLAT AFFECT   Laboratory and Imaging Data: Ct Head Wo Contrast  Result Date: 10/07/2015 CLINICAL DATA:  Head injury 1 week ago.  Headache and blurred vision EXAM: CT HEAD WITHOUT CONTRAST TECHNIQUE: Contiguous axial images were obtained from the base of the skull through the vertex without intravenous contrast. COMPARISON:  CT 09/30/2015 FINDINGS: Brain: Chronic occipital lobe encephalomalacia bilaterally unchanged. Chronic infarcts in the caudate bilaterally also unchanged. No acute  infarct. Negative for acute hemorrhage or fluid collection. Negative for mass or edema. Vascular: Bilateral carotid artery calcification in the cavernous carotid. Negative for dense MCA. Skull: Negative Sinuses/Orbits: Negative Other: Negative IMPRESSION: No acute abnormality and no change from the prior study. Chronic infarcts in the occipital lobes bilaterally. Chronic bilateral caudate infarcts. Electronically Signed   By: Marlan Palau M.D.   On: 10/07/2015 16:51    Assessment and Plan:   Post concussive syndrome  Concussion with no loss of consciousness, subsequent encounter  Postconcussive syndrome secondary to acute concussion (or mild traumatic brain injury) sustained in the workplace via a large piece of metal which fell on the top of her head. Since that time, the patient has been symptomatic and has felt unwell with symptoms that classically go along with acute concussion.  These have persisted now 15 days after initial accident. Symptoms now consistent with postconcussive  syndrome. Recommended full cognitive and physical rest. I'm going to place the patient on Elavil for postconcussive headaches as well as difficulty with sleeping.  I have asked the patient not operate a motor vehicle as well as machinery. She is not cleared to return to work.  Follow-up: 2 weeks  New Prescriptions   AMITRIPTYLINE (ELAVIL) 10 MG TABLET    1 po qhs, increase to 2 po qhs if tolerated    Patient Instructions  HEAD INJURY / CONCUSSSION:   MOST PEOPLE RECOVER FINE AND COMPLETELY FROM A CONCUSSION, BUT THE MOST IMPORTANT THING IS VERY EARLY COMPLETE REST SO THAT THE BRAN CAN RECOVER.  COMPLETE PHYSICAL AND MENTAL REST IS NEEDED.  THAT MEANS: NO SCHOOL OR WORK UNTIL YOU ARE BETTER NO PHYSICAL EXERTION AT ALL UNTIL YOU HAVE NO SYMPTOMS NO MENTAL EXERTION, MEANING NO WORK, NO HOMEWORK, NO TEST TAKING.  NO DRIVING UNTIL YOU ARE ASYMPTOMATIC.  NO VIDEO GAMES, NO USING THE COMPUTER, NO TEXTING, NO  USING SMARTPHONES, NO USE OF AN IPAD OR TABLET. DO NOT GO TO A MOVIE THEATRE OR WATCH SPORTS ON TV. HDTV TENDS TO MAKE PEOPLE FEEL WORSE.   IN OTHER WORDS, DO NOT DO ANYTHING. SIT AND CALMLY REST UNTIL YOU FEEL BETTER. SLEEP IS OK. YOU CAN HANG OUT AND TALK TO A FRIEND.  IT IS DIFFICULT TO KNOW HOW QUICKLY YOU WILL RECOVER. SOME PEOPLE FEEL BETTER IN A FEW DAYS, WHILE OTHER PEOPLE HAVE SYMPTOMS THAT CAN LAST FOR WEEKS TO MONTHS.  EARLY REST IS BY FAR THE MOST IMPORTANT THING.  If any of the following occur notify your physician or go to the Hospital Emergency Department - if markedly worsening:  . Increased drowsiness, stupor or loss of consciousness . Restlessness or convulsions (fits) . Paralysis in arms or legs . Temperature above 100 F . Vomiting . Severe headache . Blood or clear fluid dripping from the nose or ears . Stiffness of the neck . Dizziness or blurred vision . Pulsating pain in the eye . Unequal pupils of eye . Personality changes . Any other unusual symptoms  PRECAUTIONS . Keep head elevated at all times for the first 24 hours (Elevate mattress if pillow is ineffective) . Do not take tranquilizers, sedatives, narcotics or alcohol . Avoid aspirin. Use only acetaminophen (e.g. Tylenol) or ibuprofen (e.g. Advil) for relief of pain. Follow directions on the bottle for dosage. . Use ice packs for comfort  MEDICATIONS Use medications only as directed by your physician  Concussion Direct trauma to the head often causes a condition known as a concussion. This injury will interfere with brain function and may cause you to lose consciousness. The consequences of a concussion are usually temporary, but repetitive concussions can be very dangerous. If you have multiple concussions, you will have a greater risk of long-term effects, such as slurred speech, slow movements, impaired thinking, or tremors. The severity of a concussion is based on the length and severity of  the interference with brain activity.  SYMPTOMS  Symptoms of a concussion vary depending on the severity of the injury. Very mild concussions may even occur without any noticeable symptoms. Swelling in the area of the injury is not related to the seriousness of the injury.   CAUSES  A concussion is the result of trauma to the head. When the head is subjected to such an injury, the brain strikes against the inner wall of the skull. This impact is what causes the damage to the brain. The force  of injury is related to severity of injury. The most severe concussions are associated with incidents that involve large impact forces such as motor vehicle accidents. Wearing a helmet will reduce the severity of trauma to the head, but concussions may still occur if you are wearing a helmet.  RISK INCREASES WITH:  Contact sports (football, hockey, rugby, or lacrosse).  Fighting sports (martial arts or boxing).  Riding bicycles, motorcycles, or horses (when you ride without a helmet).   PREVENTION  Wear proper protective headgear and ensure correct fit.  Wear seat belts when driving and riding in a car.  Do not drink or use mind-altering drugs and drive.   PROGNOSIS  Concussions are typically curable if they are recognized and treated early. If a severe concussion or multiple concussions go untreated, then the complications may be life-threatening or cause permanent disability and brain damage.  RELATED COMPLICATIONS   Permanent brain damage (slurred speech, slow movement, impaired thinking, or tremors).  Bleeding under the skull (subdural hemorrhage or hematoma, epidural hematoma).  Bleeding into the brain.  Prolonged healing time if usual activities are resumed too soon.  Infection if skin over the concussion site is broken.  Increased risk of future concussions (less trauma is required for a second concussion than the first).      Signed,  Elpidio Galea. Torina Ey, MD   Patient's  Medications  New Prescriptions   AMITRIPTYLINE (ELAVIL) 10 MG TABLET    1 po qhs, increase to 2 po qhs if tolerated  Previous Medications   ALBUTEROL (PROVENTIL HFA;VENTOLIN HFA) 108 (90 BASE) MCG/ACT INHALER    Inhale 2 puffs into the lungs every 6 (six) hours as needed for wheezing or shortness of breath.   ASPIRIN 81 MG TABLET    Take 81 mg by mouth daily.     ATORVASTATIN (LIPITOR) 20 MG TABLET    Take 1 tablet (20 mg total) by mouth daily.   BUTALBITAL-ACETAMINOPHEN-CAFFEINE (FIORICET) 50-325-40 MG TABLET    Take 1-2 tablets by mouth every 6 (six) hours as needed for headache.   CLONAZEPAM (KLONOPIN) 0.5 MG TABLET    Take 1 tablet (0.5 mg total) by mouth at bedtime.   HYDROCHLOROTHIAZIDE (HYDRODIURIL) 25 MG TABLET    TAKE ONE TABLET BY MOUTH ONCE DAILY   LISINOPRIL (PRINIVIL,ZESTRIL) 20 MG TABLET    Take 1 tablet (20 mg total) by mouth daily.   METOPROLOL SUCCINATE (TOPROL-XL) 100 MG 24 HR TABLET    Take 1 tablet by mouth  daily with or immediately  following a meal   OMEPRAZOLE (PRILOSEC) 20 MG CAPSULE    Take 1 capsule (20 mg total) by mouth daily.  Modified Medications   No medications on file  Discontinued Medications   No medications on file

## 2015-10-15 NOTE — Patient Instructions (Signed)

## 2015-10-16 NOTE — Telephone Encounter (Signed)
Zachery DauerKim Stringer, Worker's comp contact for [employer] left a voice message on the triage phone regarding patient's treatment and Selena BattenKim suggests that patient be seen by Neurologist.  PCP and Dr. Patsy Lageropland agree that patient should go where her worker's comp / [employer] representatives recommend.  Left Message on [employer] Energy[employer] Stringer's voicemail requesting call back.

## 2015-10-16 NOTE — Telephone Encounter (Signed)
That is very reasonable, and it is in the patient's best interest to follow this through her Worker's Comp provider.

## 2015-10-16 NOTE — Telephone Encounter (Signed)
To Dr Patsy Lageropland for his information

## 2015-10-18 NOTE — Telephone Encounter (Signed)
Please make sure they get her set up with an appropriate doctor

## 2015-10-24 ENCOUNTER — Telehealth: Payer: Self-pay | Admitting: Internal Medicine

## 2015-10-24 NOTE — Telephone Encounter (Signed)
Pt called to check on her disability paperwork Pt went to [facility] 8/24 and 8/30 for headaches Saw dr Alphonsus Siasletvak and dr copland for concussion She stated dr copland took her out of work for 2 weeks due to concussion. Pt said something about workmans comp. I spoke with linda t she wanted me to let pt know that we do not file workmans comp and pt would be responsible for bill.  Pt is aware that we do not file workmans comp and she is responsible for bill  Pt also aware I have not received any paperwork for her

## 2015-10-28 ENCOUNTER — Ambulatory Visit (INDEPENDENT_AMBULATORY_CARE_PROVIDER_SITE_OTHER): Payer: 59 | Admitting: Internal Medicine

## 2015-10-28 ENCOUNTER — Encounter: Payer: Self-pay | Admitting: Internal Medicine

## 2015-10-28 VITALS — BP 122/80 | HR 65 | Temp 97.6°F | Wt 238.0 lb

## 2015-10-28 DIAGNOSIS — S060X0D Concussion without loss of consciousness, subsequent encounter: Secondary | ICD-10-CM | POA: Diagnosis not present

## 2015-10-28 DIAGNOSIS — E119 Type 2 diabetes mellitus without complications: Secondary | ICD-10-CM

## 2015-10-28 NOTE — Assessment & Plan Note (Signed)
Clinically improved Seeing Dr Patsy Lageropland tomorrow and is supposed to have neurology appt per worker's comp

## 2015-10-28 NOTE — Assessment & Plan Note (Signed)
Lab Results  Component Value Date   HGBA1C 7.1 (H) 09/22/2015   Still well controlled Discussed fitness and attending to the weight gain No med changes needed

## 2015-10-28 NOTE — Progress Notes (Signed)
Subjective:    Patient ID: Tami Jones, female    DOB: 07/27/1955, 60 y.o.   MRN: [id number]  HPI Here for follow up of head injury and diabetes Was contacted by Worker's Comp but never had neurology appointment set up This hasn't been set up yet  Does have follow up with Dr Patsy Lageropland tomorrow Hopes to go back to work after that appointment-- but may need accomodation (like reduced work load or length of work day) Able to concentrate better Headache mostly gone--will occasionally feel something though "I can tell a big difference"  Checks sugars once every couple of weeks Usually 120 or so--occasionally in 130's. Never over 140 Trying to eat right No low sugar reactions -- but occasionally feels tired when hungry No numbness, sores or pain in feet  Current Outpatient Prescriptions on File Prior to Visit  Medication Sig Dispense Refill  . albuterol (PROVENTIL HFA;VENTOLIN HFA) 108 (90 BASE) MCG/ACT inhaler Inhale 2 puffs into the lungs every 6 (six) hours as needed for wheezing or shortness of breath. 1 Inhaler 0  . amitriptyline (ELAVIL) 10 MG tablet 1 po qhs, increase to 2 po qhs if tolerated 60 tablet 2  . aspirin 81 MG tablet Take 81 mg by mouth daily.      Marland Kitchen. atorvastatin (LIPITOR) 20 MG tablet Take 1 tablet (20 mg total) by mouth daily. 90 tablet 3  . butalbital-acetaminophen-caffeine (FIORICET) 50-325-40 MG tablet Take 1-2 tablets by mouth every 6 (six) hours as needed for headache. 20 tablet 0  . clonazePAM (KLONOPIN) 0.5 MG tablet Take 1 tablet (0.5 mg total) by mouth at bedtime. 90 tablet 0  . hydrochlorothiazide (HYDRODIURIL) 25 MG tablet TAKE ONE TABLET BY MOUTH ONCE DAILY 30 tablet 0  . lisinopril (PRINIVIL,ZESTRIL) 20 MG tablet Take 1 tablet (20 mg total) by mouth daily. 90 tablet 3  . metoprolol succinate (TOPROL-XL) 100 MG 24 hr tablet Take 1 tablet by mouth  daily with or immediately  following a meal 90 tablet 0  . omeprazole (PRILOSEC) 20 MG capsule Take 1 capsule  (20 mg total) by mouth daily. 30 capsule 11   No current facility-administered medications on file prior to visit.     Allergies  Allergen Reactions  . Citalopram Other (See Comments)    intolerant  . Doxycycline Hyclate Other (See Comments)    REACTION: stomach upset  . Erythromycin   . Montelukast Sodium Other (See Comments)    REACTION: unspecified  . Nitrofurantoin Other (See Comments)    REACTION: unspecified  . Sulfa Antibiotics Nausea Only  . Tramadol Hcl Other (See Comments)    REACTION: Can not tolerate  . Venlafaxine Hcl Other (See Comments)    Intolerant  . Amoxicillin-Pot Clavulanate Nausea Only    Past Medical History:  Diagnosis Date  . Anxiety   . 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 Thad Rangereynolds  . Diabetes mellitus   . GERD (gastroesophageal reflux disease)   . Hyperlipidemia   . Hypertension     Past Surgical History:  Procedure Laterality Date  . CARDIOVASCULAR STRESS TEST  1/15   myoview. EF 60%  . CESAREAN SECTION    . ESOPHAGOGASTRODUODENOSCOPY  05/2005  . TONSILLECTOMY AND ADENOIDECTOMY      Family History  Problem Relation Age of Onset  . Coronary artery disease Mother   . Diabetes Mellitus II Mother   . Heart attack Mother [id number]  . Diabetes Mellitus II Maternal Grandmother  Social History   Social History  . Marital status: Divorced    Spouse name: N/A  . Number of children: 2  . 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 Smoking status: Never Smoker  . Smokeless tobacco: Never Used  . Alcohol use No     Comment: wine occassionally  . Drug use: No  . Sexual activity: Yes    Birth control/ protection: Condom   Other Topics Concern  . Not on file   Social History Narrative  . No narrative on file   Review of Systems Weight is up a bit Some left abdominal pain if she turns a certain way Hopes to get back to the gym Sleeping    Intermittent itching-- skin feels bumpy at times. Uses aloe lotion    Objective:   Physical Exam  Constitutional: She appears well-developed. No distress.  Neck: Normal range of motion. No thyromegaly present.  Cardiovascular: Normal rate, regular rhythm, normal heart sounds and intact distal pulses.  Exam reveals no gallop.   No murmur heard. Pulmonary/Chest: Effort normal and breath sounds normal. No respiratory distress. She has no wheezes. She has no rales.  Musculoskeletal: She exhibits no edema or tenderness.  Lymphadenopathy:    She has no cervical adenopathy.  Skin: No rash noted. No erythema.  No foot lesions          Assessment & Plan:

## 2015-10-28 NOTE — Progress Notes (Signed)
Pre visit review using our clinic review tool, if applicable. No additional management support is needed unless otherwise documented below in the visit note. 

## 2015-10-29 ENCOUNTER — Ambulatory Visit: Payer: Self-pay | Admitting: Family Medicine

## 2015-10-29 NOTE — Telephone Encounter (Signed)
Pt aware I have not received any paperwork for fmla

## 2015-11-10 ENCOUNTER — Encounter: Payer: Self-pay | Admitting: Unknown Physician Specialty

## 2015-11-11 ENCOUNTER — Telehealth: Payer: Self-pay | Admitting: Internal Medicine

## 2015-11-11 NOTE — Telephone Encounter (Signed)
Will review with the patient face to face on Thursday.

## 2015-11-11 NOTE — Telephone Encounter (Signed)
[employer] faxed fmla papework for pt Paperwork in dr copland's IN BOX for review and signature

## 2015-11-13 ENCOUNTER — Encounter: Payer: Self-pay | Admitting: Obstetrics and Gynecology

## 2015-11-13 NOTE — Telephone Encounter (Signed)
Voicemail not set up.

## 2015-11-13 NOTE — Telephone Encounter (Signed)
Ms. Tami Jones must schedule follow up appointment with Dr. Patsy Lageropland before he will complete FMLA paperwork.  Please call to schedule appointment.

## 2015-11-14 NOTE — Telephone Encounter (Signed)
Spoke to pt she stated she is under workman's comp now and cannot do fmla.  She stated they have her doing PT.  Patient stated to disregard paperwork.

## 2015-11-14 NOTE — Telephone Encounter (Signed)
All noted.  

## 2015-11-25 ENCOUNTER — Other Ambulatory Visit: Payer: Self-pay | Admitting: Cardiovascular Disease

## 2015-11-27 ENCOUNTER — Other Ambulatory Visit: Payer: Self-pay

## 2015-11-27 MED ORDER — CLONAZEPAM 0.5 MG PO TABS: 0.5000 mg | ORAL_TABLET | Freq: Every day | ORAL | 0 refills | Status: DC

## 2015-11-27 NOTE — Telephone Encounter (Signed)
Approved: #[phone] x 0 

## 2015-11-27 NOTE — Telephone Encounter (Signed)
Rx printed for Dr Alphonsus SiasLetvak to sign so [facility] can [facility]

## 2015-11-27 NOTE — Telephone Encounter (Signed)
Fax received from [facility] for refill of Clonazepam #90. Last filled 09-04-15 #90 Last OV 10-28-15 Next OV 04-27-16

## 2015-11-28 ENCOUNTER — Emergency Department (HOSPITAL_COMMUNITY): Payer: 59

## 2015-11-28 ENCOUNTER — Telehealth: Payer: Self-pay | Admitting: Internal Medicine

## 2015-11-28 ENCOUNTER — Encounter (HOSPITAL_COMMUNITY): Payer: Self-pay

## 2015-11-28 ENCOUNTER — Emergency Department (HOSPITAL_COMMUNITY)
Admission: EM | Admit: 2015-11-28 | Discharge: 2015-11-28 | Disposition: A | Discharge status: Home / Self Care | Payer: 59 | Attending: Emergency Medicine | Admitting: Emergency Medicine

## 2015-11-28 DIAGNOSIS — R1012 Left upper quadrant pain: Secondary | ICD-10-CM | POA: Diagnosis not present

## 2015-11-28 DIAGNOSIS — E119 Type 2 diabetes mellitus without complications: Secondary | ICD-10-CM | POA: Insufficient documentation

## 2015-11-28 DIAGNOSIS — Z8673 Personal history of transient ischemic attack (TIA), and cerebral infarction without residual deficits: Secondary | ICD-10-CM | POA: Insufficient documentation

## 2015-11-28 DIAGNOSIS — R1032 Left lower quadrant pain: Secondary | ICD-10-CM | POA: Diagnosis not present

## 2015-11-28 DIAGNOSIS — R109 Unspecified abdominal pain: Secondary | ICD-10-CM | POA: Diagnosis present

## 2015-11-28 DIAGNOSIS — I10 Essential (primary) hypertension: Secondary | ICD-10-CM | POA: Diagnosis not present

## 2015-11-28 DIAGNOSIS — J45909 Unspecified asthma, uncomplicated: Secondary | ICD-10-CM | POA: Insufficient documentation

## 2015-11-28 DIAGNOSIS — Z7982 Long term (current) use of aspirin: Secondary | ICD-10-CM | POA: Insufficient documentation

## 2015-11-28 HISTORY — DX: Unspecified asthma, uncomplicated: J45.909

## 2015-11-28 LAB — CBC
HCT: 39 % (ref 36.0–46.0)
Hemoglobin: 13 g/dL (ref 12.0–15.0)
MCH: 30.5 pg (ref 26.0–34.0)
MCHC: 33.3 g/dL (ref 30.0–36.0)
MCV: 91.5 fL (ref 78.0–100.0)
PLATELETS: 215 10*3/uL (ref 150–400)
RBC: 4.26 MIL/uL (ref 3.87–5.11)
RDW: 12.9 % (ref 11.5–15.5)
WBC: 5.7 10*3/uL (ref 4.0–10.5)

## 2015-11-28 LAB — URINALYSIS, ROUTINE W REFLEX MICROSCOPIC
BILIRUBIN URINE: NEGATIVE
Glucose, UA: 100 mg/dL — AB
Hgb urine dipstick: NEGATIVE
KETONES UR: NEGATIVE mg/dL
LEUKOCYTES UA: NEGATIVE
NITRITE: NEGATIVE
Protein, ur: NEGATIVE mg/dL
Specific Gravity, Urine: 1.021 (ref 1.005–1.030)
pH: 6.5 (ref 5.0–8.0)

## 2015-11-28 LAB — BASIC METABOLIC PANEL
Anion gap: 8 (ref 5–15)
BUN: 13 mg/dL (ref 6–20)
CALCIUM: 9.2 mg/dL (ref 8.9–10.3)
CO2: 24 mmol/L (ref 22–32)
CREATININE: 1.24 mg/dL — AB (ref 0.44–1.00)
Chloride: 107 mmol/L (ref 101–111)
GFR calc Af Amer: 54 mL/min — ABNORMAL LOW (ref >60)
GFR, EST NON AFRICAN AMERICAN: 47 mL/min — AB (ref >60)
Glucose, Bld: 134 mg/dL — ABNORMAL HIGH (ref 65–99)
Potassium: 4.2 mmol/L (ref 3.5–5.1)
SODIUM: 139 mmol/L (ref 135–145)

## 2015-11-28 LAB — HEPATIC FUNCTION PANEL
ALBUMIN: 3.6 g/dL (ref 3.5–5.0)
ALT: 22 U/L (ref 14–54)
AST: 24 U/L (ref 15–41)
Alkaline Phosphatase: 70 U/L (ref 38–126)
BILIRUBIN DIRECT: 0.1 mg/dL (ref 0.1–0.5)
Indirect Bilirubin: 0.4 mg/dL (ref 0.3–0.9)
TOTAL PROTEIN: 6.5 g/dL (ref 6.5–8.1)
Total Bilirubin: 0.5 mg/dL (ref 0.3–1.2)

## 2015-11-28 LAB — CBG MONITORING, ED: GLUCOSE-CAPILLARY: 126 mg/dL — AB (ref 65–99)

## 2015-11-28 LAB — LIPASE, BLOOD: Lipase: 30 U/L (ref 11–51)

## 2015-11-28 MED ORDER — ONDANSETRON 4 MG PO TBDP
ORAL_TABLET | ORAL | Status: AC
  Administered 2015-11-28: 4 mg
  Filled 2015-11-28: qty 1

## 2015-11-28 MED ORDER — IOPAMIDOL (ISOVUE-300) INJECTION 61%
INTRAVENOUS | Status: AC
  Administered 2015-11-28: 100 mL
  Filled 2015-11-28: qty 100

## 2015-11-28 MED ORDER — HYDROCODONE-ACETAMINOPHEN 5-325 MG PO TABS: 1.0000 | ORAL_TABLET | ORAL | 0 refills | Status: DC | PRN

## 2015-11-28 MED ORDER — PROMETHAZINE HCL 25 MG PO TABS: 25.0000 mg | ORAL_TABLET | Freq: Four times a day (QID) | ORAL | 0 refills | Status: DC | PRN

## 2015-11-28 MED ORDER — ONDANSETRON HCL 4 MG/2ML IJ SOLN
4.0000 mg | Freq: Once | INTRAMUSCULAR | Status: AC
  Administered 2015-11-28: 4 mg via INTRAVENOUS
  Filled 2015-11-28: qty 2

## 2015-11-28 MED ORDER — MORPHINE SULFATE (PF) 4 MG/ML IV SOLN
4.0000 mg | Freq: Once | INTRAVENOUS | Status: AC
  Administered 2015-11-28: 4 mg via INTRAVENOUS
  Filled 2015-11-28: qty 1

## 2015-11-28 NOTE — Telephone Encounter (Signed)
[facility] - Client TELEPHONE ADVICE RECORD [facility]  Patient Name: Tonna CornerCYNTHIA Kuriakose  DOB: 12/28/1955    Initial Comment Having pain in left kidney and lower back    Nurse Assessment  Nurse: Laural BenesJohnson, RN, Dondra SpryGail Date/Time Lamount Cohen(Eastern Time): 11/28/2015 9:04:52 AM  Confirm and document reason for call. If symptomatic, describe symptoms. You must click the next button to save text entered. ---Aram BeechamCynthia is having pain in left flank and nauseated and frequency of urination; 11pm last evening  Has the patient traveled out of the country within the last 30 days? ---No  Does the patient have any new or worsening symptoms? ---Yes  Will a triage be completed? ---Yes  Related visit to physician within the last 2 weeks? ---No  Does the PT have any chronic conditions? (i.e. diabetes, asthma, etc.) ---Yes  List chronic conditions. ---HTN Anxiety  Is this a behavioral health or substance abuse call? ---No     Guidelines    Guideline Title Affirmed Question Affirmed Notes  Flank Pain [1] SEVERE pain (e.g., excruciating, scale 8-10) AND [2] not improved after pain medicine    Final Disposition User   Go to ED Now Laural BenesJohnson, RN, Dondra SpryGail    Comments  FYI Due to the severity of the pain it recommends ED visit only Advised patient and [facility]   [facility]  [facility]   Disagree/Comply: Comply

## 2015-11-28 NOTE — ED Provider Notes (Signed)
[facility]-EMERGENCY DEPT Provider Note   CSN: [id number] Arrival date & time: 11/28/15  1218     History   Chief Complaint Chief Complaint  Patient presents with  . Flank Pain    HPI Tami Jones is a 60 y.o. female with past medical history as outlined below most significant for diet control DM, GERD, HTN and h/o cva x 2 with residual reduction in peripheral vision Presenting with a three-day history of  left-sided flank abdominal and groin pain in association with nausea and abdominal distention.  She also endorses increased eructation and passage of flatus which has not significantly improved her symptoms.  She had a small looser than normal stool this morning without significant improvement in her symptoms.  She denies history of constipation.  She does endorse increased urinary frequency, denies hematuria.  She has had no fevers but endorses feeling chilled yesterday evening.  She has nausea without emesis.  She denies history of kidney stones and has no vaginal discharge.  She has maintained by mouth intake but has had a reduced appetite today.  Her pain is worse movement, but still present at rest.  She has had no medications or treatments prior to arrival.     The history is provided by the patient.    Past Medical History:  Diagnosis Date  . Anxiety   . Asthma   . 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 Thad Ranger  . Diabetes mellitus   . GERD (gastroesophageal reflux disease)   . Hyperlipidemia   . Hypertension     Patient Active Problem List   Diagnosis Date Noted  . Concussion with no loss of consciousness 10/09/2015  . Fatigue 09/22/2015  . Tick bite 07/15/2015  . Type 2 diabetes, controlled, with renal manifestation (HCC) 04/26/2015  . Cerebrovascular disease 07/17/2014  . Preventative health care 06/25/2014  . Right sided sciatica 03/08/2013  . MENOPAUSAL SYNDROME 09/10/2008  . Mood disorder (HCC) 09/15/2007  . Essential  hypertension, benign 08/10/2006  . Diabetes mellitus type 2, controlled, without complications (HCC) 06/17/2006  . HYPERCHOLESTEROLEMIA 06/17/2006  . GERD 06/17/2006    Past Surgical History:  Procedure Laterality Date  . CARDIOVASCULAR STRESS TEST  1/15   myoview. EF 60%  . CESAREAN SECTION    . ESOPHAGOGASTRODUODENOSCOPY  05/2005  . TONSILLECTOMY AND ADENOIDECTOMY      OB History    Gravida Para Term Preterm AB Living   2 2 2  0 0 2   SAB TAB Ectopic Multiple Live Births   0 0 0 0         Home Medications    Prior to Admission medications   Medication Sig Start Date End Date Taking? Authorizing Provider  amitriptyline (ELAVIL) 10 MG tablet 1 po qhs, increase to 2 po qhs if tolerated Patient taking differently: Take 10 mg by mouth at bedtime as needed for sleep.  10/15/15  Yes Hannah Beat, MD  aspirin EC 81 MG tablet Take 81 mg by mouth daily.   Yes Historical Provider, MD  atorvastatin (LIPITOR) 20 MG tablet Take 1 tablet (20 mg total) by mouth daily. 03/19/14  Yes Karie Schwalbe, MD  clonazePAM (KLONOPIN) 0.5 MG tablet Take 1 tablet (0.5 mg total) by mouth at bedtime. Patient taking differently: Take 0.25 mg by mouth at bedtime as needed (sleep).  11/27/15  Yes Karie Schwalbe, MD  hydrochlorothiazide (HYDRODIURIL) 25 MG tablet TAKE ONE TABLET BY MOUTH ONCE DAILY 09/03/15  Yes Deloris Ping  Nahser, MD  lisinopril (PRINIVIL,ZESTRIL) 20 MG tablet Take 1 tablet (20 mg total) by mouth daily. 04/25/15  Yes Karie Schwalbe, MD  metoprolol succinate (TOPROL-XL) 100 MG 24 hr tablet Take 1 tablet by mouth  daily with or immediately  following a meal 07/30/15  Yes Karie Schwalbe, MD  omeprazole (PRILOSEC) 20 MG capsule Take 1 capsule (20 mg total) by mouth daily. 12/11/14  Yes Karie Schwalbe, MD  OVER THE COUNTER MEDICATION Place 1 drop into both eyes daily as needed (dry eyes). Over the counter lubricating eye drop   Yes Historical Provider, MD  albuterol (PROVENTIL HFA;VENTOLIN HFA)  108 (90 BASE) MCG/ACT inhaler Inhale 2 puffs into the lungs every 6 (six) hours as needed for wheezing or shortness of breath. Patient not taking: Reported on 11/28/2015 01/28/15   Phineas Semen, MD  butalbital-acetaminophen-caffeine (FIORICET) [phone] MG tablet Take 1-2 tablets by mouth every 6 (six) hours as needed for headache. Patient not taking: Reported on 11/28/2015 10/07/15   Evangeline Dakin, PA-C  HYDROcodone-acetaminophen (NORCO/VICODIN) 5-325 MG tablet Take 1 tablet by mouth every 4 (four) hours as needed for moderate pain. 11/28/15   Burgess Amor, PA-C    Family History Family History  Problem Relation Age of Onset  . Coronary artery disease Mother   . Diabetes Mellitus II Mother   . Heart attack Mother 84  . Diabetes Mellitus II Maternal Grandmother     Social History Social History  Substance Use Topics  . Smoking status: Never Smoker  . Smokeless tobacco: Never Used  . Alcohol use No     Comment: wine occassionally     Allergies   Citalopram; Doxycycline hyclate; Erythromycin; Montelukast sodium; Nitrofurantoin; Sulfa antibiotics; Tramadol hcl; Venlafaxine hcl; and Amoxicillin-pot clavulanate   Review of Systems Review of Systems  Constitutional: Positive for chills. Negative for fever.  HENT: Negative for congestion and sore throat.   Eyes: Negative.   Respiratory: Negative for chest tightness and shortness of breath.   Cardiovascular: Negative for chest pain.  Gastrointestinal: Positive for abdominal distention, abdominal pain and nausea. Negative for constipation and vomiting.  Genitourinary: Positive for dysuria. Negative for frequency, hematuria, urgency and vaginal discharge.  Musculoskeletal: Negative for arthralgias, joint swelling and neck pain.  Skin: Negative.  Negative for rash and wound.  Neurological: Negative for dizziness, weakness, light-headedness, numbness and headaches.  Psychiatric/Behavioral: Negative.      Physical Exam Updated  Vital Signs BP 164/78 (BP Location: Left Arm)   Pulse 69   Temp 98 F (36.7 C) (Oral)   Resp 16   Ht 5\' 4"  (1.626 m)   Wt 108.6 kg   SpO2 99%   BMI 41.10 kg/m   Physical Exam  Constitutional: She appears well-developed and well-nourished.  HENT:  Head: Normocephalic and atraumatic.  Eyes: Conjunctivae are normal.  Neck: Normal range of motion.  Cardiovascular: Normal rate, regular rhythm, normal heart sounds and intact distal pulses.   Pulmonary/Chest: Effort normal and breath sounds normal. She has no wheezes.  Abdominal: Soft. Bowel sounds are normal. She exhibits distension. She exhibits no mass. There is tenderness in the left upper quadrant and left lower quadrant. There is no rebound and no guarding.  She is also tender along her left lower back.  She has no distinct CVA tenderness.  Adipose abdomen without increased tympany to percussion.  Musculoskeletal: Normal range of motion.  Neurological: She is alert.  Skin: Skin is warm and dry.  Psychiatric: She has a normal mood  and affect.  Nursing note and vitals reviewed.    ED Treatments / Results  Labs (all labs ordered are listed, but only abnormal results are displayed) Labs Reviewed  URINALYSIS, ROUTINE W REFLEX MICROSCOPIC (NOT AT [facility]) - Abnormal; Notable for the following:       Result Value   Glucose, UA 100 (*)    All other components within normal limits  BASIC METABOLIC PANEL - Abnormal; Notable for the following:    Glucose, Bld 134 (*)    Creatinine, Ser 1.24 (*)    GFR calc non Af Amer 47 (*)    GFR calc Af Amer 54 (*)    All other components within normal limits  CBG MONITORING, ED - Abnormal; Notable for the following:    Glucose-Capillary 126 (*)    All other components within normal limits  CBC  HEPATIC FUNCTION PANEL  LIPASE, BLOOD    EKG  EKG Interpretation None       Radiology Ct Abdomen Pelvis W Contrast  Result Date: 11/28/2015 CLINICAL DATA:  Left flank pain EXAM: CT ABDOMEN  AND PELVIS WITH CONTRAST TECHNIQUE: Multidetector CT imaging of the abdomen and pelvis was performed using the standard protocol following bolus administration of intravenous contrast. CONTRAST:  80 ml ISOVUE-300 IOPAMIDOL (ISOVUE-300) INJECTION 61% COMPARISON:  None. FINDINGS: Lower chest: No pulmonary nodules. No visible pleural or pericardial effusion. Hepatobiliary: Normal hepatic size and contours without focal liver lesion. No perihepatic ascites. No intra- or extrahepatic biliary dilatation. Normal gallbladder. Pancreas: Normal pancreatic contours and enhancement. No peripancreatic fluid collection or pancreatic ductal dilatation. Spleen: Normal. Adrenals/Urinary Tract: Normal adrenal glands. There is right pelviectasis and mildly dilated right ureter, but no obstructing stone. The left kidney is normal. Stomach/Bowel: No abnormal bowel dilatation. No bowel wall thickening or adjacent fat stranding to indicate acute inflammation. No abdominal fluid collection. The appendix is not seen. Mild mesenteric edema, unchanged from prior studies. Vascular/Lymphatic: Normal course and caliber of the major abdominal vessels. No abdominal or pelvic adenopathy. Reproductive: The uterus is unremarkable. The left ovary is normal. Right ovary is not clearly seen. Musculoskeletal: No lytic or blastic osseous lesion. Normal visualized extrathoracic and extraperitoneal soft tissues. Other: No contributory non-categorized findings. IMPRESSION: No acute abnormality of the abdomen or pelvis. Electronically Signed   By: Deatra Robinson M.D.   On: 11/28/2015 20:35    Procedures Procedures (including critical care time)  Medications Ordered in ED Medications  ondansetron (ZOFRAN-ODT) 4 MG disintegrating tablet (4 mg  Given 11/28/15 1324)  morphine 4 MG/ML injection 4 mg (4 mg Intravenous Given 11/28/15 1934)  ondansetron (ZOFRAN) injection 4 mg (4 mg Intravenous Given 11/28/15 1933)  iopamidol (ISOVUE-300) 61 % injection  (100 mLs  Contrast Given 11/28/15 1958)     Initial Impression / Assessment and Plan / ED Course  I have reviewed the triage vital signs and the nursing notes.  Pertinent labs & imaging results that were available during my care of the patient were reviewed by me and considered in my medical decision making (see chart for details).  Clinical Course    Flank and abdominal pain of unclear etiology, possible functional/gas trapping given increased eructation/flatulence.  No sbo, CT negative.  Pt encouraged f/u with pcp this week if sx persist.  Continue omeprazole, add antacid of choice.  Prescribed hydrocodone - advised to use sparingly.    The patient appears reasonably screened and/or stabilized for discharge and I doubt any other medical condition or other [facility] requiring further screening,  evaluation, or treatment in the ED at this time prior to discharge.   Final Clinical Impressions(s) / ED Diagnoses   Final diagnoses:  Flank pain    New Prescriptions New Prescriptions   HYDROCODONE-ACETAMINOPHEN (NORCO/VICODIN) 5-325 MG TABLET    Take 1 tablet by mouth every 4 (four) hours as needed for moderate pain.     Burgess AmorJulie Areesha Dehaven, PA-C 11/28/15 [id number]    Maia PlanJoshua G Long, MD 11/29/15 [phone]

## 2015-11-28 NOTE — ED Triage Notes (Signed)
Pt. Reports having lt. Flank pain that radiates into her lt. Pelvic area.  She also is having  Pressure in her groin.   Pt. Having nausea, Denies any vomtiing.  Denies any fevers but she had chills and sweats last night. Skin is warm and dry.  Pt. Is alert and oriented X4.

## 2015-11-28 NOTE — Telephone Encounter (Signed)
Unfortunately, the triage makes sense---but not if you really know her.  She has multiple symptoms and is not resilient. This probably could have started with office evaluation but will need to see outcome. I will await the ER evaluation and decide about follow up

## 2015-11-28 NOTE — ED Notes (Signed)
Patient Alert and oriented X4. Stable and ambulatory. Patient verbalized understanding of the discharge instructions.  Patient belongings were taken by the patient.  

## 2015-11-28 NOTE — Discharge Instructions (Signed)
Your lab work and CT scans are negative tonight as discussed.  I suggest adding an antacid to the omeprazole you are currently taking to help with the increased gas and burping (pepcid, maalox or mylanta) are good choices.  See your doctor next week for  a recheck if your symptoms persist. You may take the hydrocodone prescribed for pain relief.  This will make you drowsy - do not drive within 4 hours of taking this medication.

## 2015-12-05 ENCOUNTER — Ambulatory Visit: Payer: 59 | Admitting: Internal Medicine

## 2016-01-03 ENCOUNTER — Emergency Department: Payer: 59

## 2016-01-03 ENCOUNTER — Emergency Department
Admission: EM | Admit: 2016-01-03 | Discharge: 2016-01-03 | Disposition: A | Discharge status: Home / Self Care | Payer: 59 | Attending: Emergency Medicine | Admitting: Emergency Medicine

## 2016-01-03 DIAGNOSIS — E119 Type 2 diabetes mellitus without complications: Secondary | ICD-10-CM | POA: Insufficient documentation

## 2016-01-03 DIAGNOSIS — Z79899 Other long term (current) drug therapy: Secondary | ICD-10-CM | POA: Insufficient documentation

## 2016-01-03 DIAGNOSIS — J45909 Unspecified asthma, uncomplicated: Secondary | ICD-10-CM | POA: Insufficient documentation

## 2016-01-03 DIAGNOSIS — I10 Essential (primary) hypertension: Secondary | ICD-10-CM | POA: Insufficient documentation

## 2016-01-03 DIAGNOSIS — R42 Dizziness and giddiness: Secondary | ICD-10-CM | POA: Diagnosis not present

## 2016-01-03 DIAGNOSIS — Z7982 Long term (current) use of aspirin: Secondary | ICD-10-CM | POA: Insufficient documentation

## 2016-01-03 DIAGNOSIS — R55 Syncope and collapse: Secondary | ICD-10-CM | POA: Diagnosis present

## 2016-01-03 LAB — BASIC METABOLIC PANEL
ANION GAP: 7 (ref 5–15)
BUN: 15 mg/dL (ref 6–20)
CHLORIDE: 104 mmol/L (ref 101–111)
CO2: 28 mmol/L (ref 22–32)
Calcium: 9.1 mg/dL (ref 8.9–10.3)
Creatinine, Ser: 0.95 mg/dL (ref 0.44–1.00)
GFR calc Af Amer: >60 mL/min (ref >60)
GLUCOSE: 141 mg/dL — AB (ref 65–99)
POTASSIUM: 4 mmol/L (ref 3.5–5.1)
Sodium: 139 mmol/L (ref 135–145)

## 2016-01-03 LAB — URINALYSIS COMPLETE WITH MICROSCOPIC (ARMC ONLY)
BACTERIA UA: NONE SEEN
Bilirubin Urine: NEGATIVE
GLUCOSE, UA: NEGATIVE mg/dL
HGB URINE DIPSTICK: NEGATIVE
Ketones, ur: NEGATIVE mg/dL
LEUKOCYTES UA: NEGATIVE
NITRITE: NEGATIVE
PH: 7 (ref 5.0–8.0)
Protein, ur: NEGATIVE mg/dL
RBC / HPF: NONE SEEN RBC/hpf (ref 0–5)
Specific Gravity, Urine: 1.005 (ref 1.005–1.030)

## 2016-01-03 LAB — CBC
HEMATOCRIT: 40.9 % (ref 35.0–47.0)
HEMOGLOBIN: 13.9 g/dL (ref 12.0–16.0)
MCH: 30.9 pg (ref 26.0–34.0)
MCHC: 33.9 g/dL (ref 32.0–36.0)
MCV: 91.2 fL (ref 80.0–100.0)
Platelets: 211 10*3/uL (ref 150–440)
RBC: 4.49 MIL/uL (ref 3.80–5.20)
RDW: 12.9 % (ref 11.5–14.5)
WBC: 5.9 10*3/uL (ref 3.6–11.0)

## 2016-01-03 LAB — GLUCOSE, CAPILLARY: GLUCOSE-CAPILLARY: 120 mg/dL — AB (ref 65–99)

## 2016-01-03 MED ORDER — LORAZEPAM 2 MG/ML IJ SOLN
INTRAMUSCULAR | Status: AC
  Administered 2016-01-03: 1 mg via INTRAVENOUS
  Filled 2016-01-03: qty 1

## 2016-01-03 MED ORDER — LORAZEPAM 2 MG/ML IJ SOLN
1.0000 mg | Freq: Once | INTRAMUSCULAR | Status: AC
  Administered 2016-01-03: 1 mg via INTRAVENOUS

## 2016-01-03 MED ORDER — MECLIZINE HCL 25 MG PO TABS: 25.0000 mg | ORAL_TABLET | Freq: Three times a day (TID) | ORAL | 0 refills | Status: DC | PRN

## 2016-01-03 MED ORDER — SODIUM CHLORIDE 0.9 % IV BOLUS (SEPSIS)
1000.0000 mL | Freq: Once | INTRAVENOUS | Status: AC
Start: 2016-01-03 — End: 2016-01-03
  Administered 2016-01-03: 1000 mL via INTRAVENOUS

## 2016-01-03 MED ORDER — ACETAMINOPHEN 325 MG PO TABS
ORAL_TABLET | ORAL | Status: AC
  Administered 2016-01-03: 650 mg
  Filled 2016-01-03: qty 2

## 2016-01-03 MED ORDER — MECLIZINE HCL 25 MG PO TABS
25.0000 mg | ORAL_TABLET | Freq: Once | ORAL | Status: AC
  Administered 2016-01-03: 25 mg via ORAL
  Filled 2016-01-03: qty 1

## 2016-01-03 NOTE — ED Triage Notes (Signed)
Per EMS, pt was at the parade [facility] became dizzy and felt like she was going to pass out. Pt states she is not sure if she had LOC or not. EMS states pt was assisted to the ground. EMS BP at the scene was 210/103 and prior to arrival was 183/84. Pt has hx of CVA and HTN. Pt states she takes her BP meds around noon. EMS blood sugar was 161.

## 2016-01-03 NOTE — Discharge Instructions (Signed)
Please seek medical attention for any high fevers, chest pain, shortness of breath, change in behavior, persistent vomiting, bloody stool or any other new or concerning symptoms.  

## 2016-01-03 NOTE — ED Notes (Signed)
Pt states she feels nauseous and has a headache since the episode.

## 2016-01-03 NOTE — ED Notes (Signed)
DC instructions were discussed with pt, pt verbalizes understanding and with teach back. Pt verbalized follow up instructions and medication. Pt did not have any follow up questions. Pt verbalized to this nurse appreciation of care received here by everyone during this visit.

## 2016-01-03 NOTE — ED Provider Notes (Signed)
[facility] Emergency Department Provider Note   ____________________________________________   I have reviewed the triage vital signs and the nursing notes.   HISTORY  Chief Complaint Near Syncope   History limited by: Not Limited   HPI Tami Jones is a 60 y.o. female who presents to the emergency department today because of a near syncopal episode. The patient states that she was watching a parade. When she was returning from the bathroom she started feeling like the world was spinning around her. She then went slowly to the ground, denies any injuries. Since then she has had occasional further episodes of dizziness. It is worse when she sits up or changes position. She denies any fevers.   Past Medical History:  Diagnosis Date  . Anxiety   . Asthma   . 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 Thad Rangereynolds  . Diabetes mellitus   . GERD (gastroesophageal reflux disease)   . Hyperlipidemia   . Hypertension     Patient Active Problem List   Diagnosis Date Noted  . Concussion with no loss of consciousness 10/09/2015  . Fatigue 09/22/2015  . Tick bite 07/15/2015  . Type 2 diabetes, controlled, with renal manifestation (HCC) 04/26/2015  . Cerebrovascular disease 07/17/2014  . Preventative health care 06/25/2014  . Right sided sciatica 03/08/2013  . MENOPAUSAL SYNDROME 09/10/2008  . Mood disorder (HCC) 09/15/2007  . Essential hypertension, benign 08/10/2006  . Diabetes mellitus type 2, controlled, without complications (HCC) 06/17/2006  . HYPERCHOLESTEROLEMIA 06/17/2006  . GERD 06/17/2006    Past Surgical History:  Procedure Laterality Date  . CARDIOVASCULAR STRESS TEST  1/15   myoview. EF 60%  . CESAREAN SECTION    . ESOPHAGOGASTRODUODENOSCOPY  05/2005  . TONSILLECTOMY AND ADENOIDECTOMY      Prior to Admission medications   Medication Sig Start Date End Date Taking? Authorizing Provider  albuterol (PROVENTIL  HFA;VENTOLIN HFA) 108 (90 BASE) MCG/ACT inhaler Inhale 2 puffs into the lungs every 6 (six) hours as needed for wheezing or shortness of breath. Patient not taking: Reported on 11/28/2015 01/28/15   Phineas SemenGraydon Isaiahs Chancy, MD  amitriptyline (ELAVIL) 10 MG tablet 1 po qhs, increase to 2 po qhs if tolerated Patient taking differently: Take 10 mg by mouth at bedtime as needed for sleep.  10/15/15   Hannah BeatSpencer Copland, MD  aspirin EC 81 MG tablet Take 81 mg by mouth daily.    Historical Provider, MD  atorvastatin (LIPITOR) 20 MG tablet Take 1 tablet (20 mg total) by mouth daily. 03/19/14   Karie Schwalbeichard I Letvak, MD  butalbital-acetaminophen-caffeine (FIORICET) [phone] MG tablet Take 1-2 tablets by mouth every 6 (six) hours as needed for headache. Patient not taking: Reported on 11/28/2015 10/07/15   Charmayne Sheerharles M Beers, PA-C  clonazePAM (KLONOPIN) 0.5 MG tablet Take 1 tablet (0.5 mg total) by mouth at bedtime. Patient taking differently: Take 0.25 mg by mouth at bedtime as needed (sleep).  11/27/15   Karie Schwalbeichard I Letvak, MD  hydrochlorothiazide (HYDRODIURIL) 25 MG tablet TAKE ONE TABLET BY MOUTH ONCE DAILY 09/03/15   Vesta MixerPhilip J Nahser, MD  HYDROcodone-acetaminophen (NORCO/VICODIN) 5-325 MG tablet Take 1 tablet by mouth every 4 (four) hours as needed for moderate pain. 11/28/15   Burgess AmorJulie Idol, PA-C  lisinopril (PRINIVIL,ZESTRIL) 20 MG tablet Take 1 tablet (20 mg total) by mouth daily. 04/25/15   Karie Schwalbeichard I Letvak, MD  metoprolol succinate (TOPROL-XL) 100 MG 24 hr tablet Take 1 tablet by mouth  daily with or immediately  following a meal 07/30/15   Karie Schwalbeichard I Letvak, MD  omeprazole (PRILOSEC) 20 MG capsule Take 1 capsule (20 mg total) by mouth daily. 12/11/14   Karie Schwalbeichard I Letvak, MD  OVER THE COUNTER MEDICATION Place 1 drop into both eyes daily as needed (dry eyes). Over the counter lubricating eye drop    Historical Provider, MD  promethazine (PHENERGAN) 25 MG tablet Take 1 tablet (25 mg total) by mouth every 6 (six) hours as needed for  nausea or vomiting. 11/28/15   Burgess AmorJulie Idol, PA-C    Allergies Citalopram; Doxycycline hyclate; Erythromycin; Montelukast sodium; Nitrofurantoin; Sulfa antibiotics; Tramadol hcl; Venlafaxine hcl; and Amoxicillin-pot clavulanate  Family History  Problem Relation Age of Onset  . Coronary artery disease Mother   . Diabetes Mellitus II Mother   . Heart attack Mother [id number]  . Diabetes Mellitus II Maternal Grandmother     Social History Social History  Substance Use Topics  . Smoking status: Never Smoker  . Smokeless tobacco: Never Used  . Alcohol use No     Comment: wine occassionally    Review of Systems  Constitutional: Negative for fever. Cardiovascular: Negative for chest pain. Respiratory: Negative for shortness of breath. Gastrointestinal: Negative for abdominal pain, vomiting and diarrhea. Genitourinary: Negative for dysuria. Musculoskeletal: Negative for back pain. Skin: Negative for rash. Neurological: Positive for dizziness.   10-point ROS otherwise negative.  ____________________________________________   PHYSICAL EXAM:  VITAL SIGNS: ED Triage Vitals  Enc Vitals Group     BP 01/03/16 1255 (!) 189/99     Pulse Rate 01/03/16 1255 65     Resp 01/03/16 1255 18     Temp 01/03/16 1255 98.1 F (36.7 C)     Temp Source 01/03/16 1255 Oral     SpO2 01/03/16 1255 99 %     Weight 01/03/16 1257 236 lb (107 kg)     Height 01/03/16 1257 5\' 4"  (1.626 m)     Head Circumference --      Peak Flow --      Pain Score 01/03/16 1313 5   Constitutional: Alert and oriented. Well appearing and in no distress. Eyes: Conjunctivae are normal. Normal extraocular movements. ENT   Head: Normocephalic and atraumatic.   Nose: No congestion/rhinnorhea.   Mouth/Throat: Mucous membranes are moist.   Neck: No stridor. Hematological/Lymphatic/Immunilogical: No cervical lymphadenopathy. Cardiovascular: Normal rate, regular rhythm.  No murmurs, rubs, or gallops.  Respiratory:  Normal respiratory effort without tachypnea nor retractions. Breath sounds are clear and equal bilaterally. No wheezes/rales/rhonchi. Gastrointestinal: Soft and nontender. No distention.  Genitourinary: Deferred Musculoskeletal: Normal range of motion in all extremities. No lower extremity edema. Neurologic:  Normal speech and language. No gross focal neurologic deficits are appreciated.  Skin:  Skin is warm, dry and intact. No rash noted. Psychiatric: Mood and affect are normal. Speech and behavior are normal. Patient exhibits appropriate insight and judgment.  ____________________________________________    LABS (pertinent positives/negatives)  Labs Reviewed  BASIC METABOLIC PANEL - Abnormal; Notable for the following:       Result Value   Glucose, Bld 141 (*)    All other components within normal limits  URINALYSIS COMPLETEWITH MICROSCOPIC ([facility] ONLY) - Abnormal; Notable for the following:    Color, Urine COLORLESS (*)    APPearance CLEAR (*)    Squamous Epithelial / LPF 0-5 (*)    All other components within normal limits  GLUCOSE, CAPILLARY - Abnormal; Notable for the following:    Glucose-Capillary 120 (*)    All  other components within normal limits  CBC  CBG MONITORING, ED     ____________________________________________   EKG  I, Phineas Semen, attending physician, personally viewed and interpreted this EKG  EKG Time: 1301 Rate: 62 Rhythm: normal sinus rhythm Axis: normal Intervals: qtc 442 QRS: narrow ST changes: no st elevation Impression: normal ekg   ____________________________________________    RADIOLOGY  MRI brain  IMPRESSION: Stable since 2016 areas of BILATERAL occipital and basal ganglia cerebral infarction and white matter signal abnormality, most consistent with chronic cerebral vascular disease.  No acute intracranial findings are evident. No findings definitive for  PRES.  ____________________________________________   PROCEDURES  Procedures  ____________________________________________   INITIAL IMPRESSION / ASSESSMENT AND PLAN / ED COURSE  Pertinent labs & imaging results that were available during my care of the patient were reviewed by me and considered in my medical decision making (see chart for details).  Patient here with dizziness. Initially did appear to be vertigo in nature given positional component. Patient was also found to be orthostatic. IVFs and antivert were given without any significant relief in symptoms. The patient did undergo MRI which did not show any acute pathology. Given this negative finding feel patient likely is suffering from vertigo. Will discharge with antivert and ENT follow up.   ____________________________________________   FINAL CLINICAL IMPRESSION(S) / ED DIAGNOSES  Final diagnoses:  Dizziness     Note: This dictation was prepared with Dragon dictation. Any transcriptional errors that result from this process are unintentional     Phineas Semen, MD 01/03/16 2030

## 2016-01-03 NOTE — ED Notes (Signed)
RN at bedside to see if pt feels able to ambulate with assistance. Pt reports she feels "very sleepy" at the moment and requested 10 minutes to wake up before ambulating. RN will reassess and ambulate pt in 10 minutes.

## 2016-01-03 NOTE — ED Notes (Signed)
Pt ambulated to rm BR with assistance.

## 2016-01-03 NOTE — ED Notes (Signed)
Patient transported to MRI 

## 2016-01-03 NOTE — ED Notes (Signed)
Pt needed to use the BR, this nurse assisted pt to the BR in room. Pt stated she felt light headed upon walking to the toilet and ambulation was staggered. Pt was able to use the BR without assistance and get back in bed. MD was notified.

## 2016-01-03 NOTE — ED Notes (Signed)
Notified MD of pt wanting to know about taking her BP medication. MD ordered orthostatic vitals and MRI and for pt to not take BP meds at this time. Pt notified.

## 2016-01-03 NOTE — ED Notes (Signed)
Pt able to ambulate independently with some difficulty noted. Pt had multiple episodes of stumbling and  At one point had to reach out for wall for stability. Pt reports being lightheaded from the time pt was sat up and throughout entire time ambulating. MD made aware.

## 2016-01-05 ENCOUNTER — Telehealth: Payer: Self-pay

## 2016-01-05 NOTE — Telephone Encounter (Signed)
Tried to call pt to f/u on ER visit for Syncope. VM has not been set up

## 2016-01-14 ENCOUNTER — Other Ambulatory Visit: Payer: Self-pay | Admitting: Internal Medicine

## 2016-01-21 ENCOUNTER — Encounter: Payer: Self-pay | Admitting: Internal Medicine

## 2016-01-21 ENCOUNTER — Ambulatory Visit (INDEPENDENT_AMBULATORY_CARE_PROVIDER_SITE_OTHER): Payer: 59 | Admitting: Internal Medicine

## 2016-01-21 VITALS — BP 154/90 | HR 68 | Temp 98.8°F | Wt 239.0 lb

## 2016-01-21 DIAGNOSIS — S060X0S Concussion without loss of consciousness, sequela: Secondary | ICD-10-CM | POA: Diagnosis not present

## 2016-01-21 DIAGNOSIS — I10 Essential (primary) hypertension: Secondary | ICD-10-CM

## 2016-01-21 DIAGNOSIS — R42 Dizziness and giddiness: Secondary | ICD-10-CM

## 2016-01-21 DIAGNOSIS — H8190 Unspecified disorder of vestibular function, unspecified ear: Secondary | ICD-10-CM | POA: Insufficient documentation

## 2016-01-21 NOTE — Assessment & Plan Note (Signed)
BP Readings from Last 3 Encounters:  01/21/16 (!) 154/90  01/03/16 (!) 169/96  11/28/15 162/70   Stressed and upset now Will hold off on changes since not clear she is really not adequately controlled

## 2016-01-21 NOTE — Progress Notes (Signed)
Subjective:    Patient ID: Tami Jones, female    DOB: 04/26/1955, 60 y.o.   MRN: [id number]  HPI Here due to worsening dizziness Reviewed recent ER visit--[place] after going to bathroom Everything was spinning and she fell Rescue brought her to hospital though she preferred not to go Tried meclizine-- didn't help MRI of brain was stable  Still going to [facility] --vestibular training apparently Still has dizziness every day Head "is spinning"--has to sit or put her head down Not orthostatic--as long as she doesn't get up too quick  Still sees neurologist in [place] for workman's comp from head trauma at work She feels this is different  Throat stays sore Chest hurts Not able to walk or exercise--due to dizziness  Current Outpatient Prescriptions on File Prior to Visit  Medication Sig Dispense Refill  . amitriptyline (ELAVIL) 10 MG tablet 1 po qhs, increase to 2 po qhs if tolerated (Patient taking differently: Take 10 mg by mouth at bedtime as needed for sleep. ) 60 tablet 2  . aspirin EC 81 MG tablet Take 81 mg by mouth daily.    Marland Kitchen. atorvastatin (LIPITOR) 20 MG tablet Take 1 tablet (20 mg total) by mouth daily. 90 tablet 3  . hydrochlorothiazide (HYDRODIURIL) 25 MG tablet TAKE ONE TABLET BY MOUTH ONCE DAILY 30 tablet 0  . lisinopril (PRINIVIL,ZESTRIL) 20 MG tablet Take 1 tablet (20 mg total) by mouth daily. 90 tablet 3  . metoprolol succinate (TOPROL-XL) 100 MG 24 hr tablet TAKE 1 TABLET BY MOUTH  DAILY WITH OR IMMEDIATELY  FOLLOWING A MEAL 90 tablet 2  . omeprazole (PRILOSEC) 20 MG capsule Take 1 capsule (20 mg total) by mouth daily. 30 capsule 11  . OVER THE COUNTER MEDICATION Place 1 drop into both eyes daily as needed (dry eyes). Over the counter lubricating eye drop     No current facility-administered medications on file prior to visit.     Allergies  Allergen Reactions  . Citalopram Other (See Comments)    Feels odd  . Doxycycline Hyclate  Nausea And Vomiting  . Erythromycin Other (See Comments)    Irritated stomach  . Montelukast Sodium Itching  . Nitrofurantoin Nausea And Vomiting  . Sulfa Antibiotics Nausea And Vomiting  . Tramadol Hcl Other (See Comments)    Feels odd  . Venlafaxine Hcl Other (See Comments)    Feels odd  . Amoxicillin-Pot Clavulanate Nausea And Vomiting    Past Medical History:  Diagnosis Date  . Anxiety   . Asthma   . 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 Thad Rangereynolds  . Diabetes mellitus   . GERD (gastroesophageal reflux disease)   . Hyperlipidemia   . Hypertension     Past Surgical History:  Procedure Laterality Date  . CARDIOVASCULAR STRESS TEST  1/15   myoview. EF 60%  . CESAREAN SECTION    . ESOPHAGOGASTRODUODENOSCOPY  05/2005  . TONSILLECTOMY AND ADENOIDECTOMY      Family History  Problem Relation Age of Onset  . Coronary artery disease Mother   . Diabetes Mellitus II Mother   . Heart attack Mother [id number]  . Diabetes Mellitus II Maternal Grandmother     Social History   Social History  . Marital status: Divorced    Spouse name: N/A  . Number of children: 2  . 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  Smoking status: Never Smoker  . Smokeless tobacco: Never Used  . Alcohol use No     Comment: wine occassionally  . Drug use: No  . Sexual activity: Yes    Birth control/ protection: Condom   Other Topics Concern  . Not on file   Social History Narrative  . No narrative on file   Review of Systems Appetite is variable Sleeps okay No recent weight loss    Objective:   Physical Exam  Constitutional: She is oriented to person, place, and time. No distress.  Neck: No thyromegaly present.  Cardiovascular: Normal rate, regular rhythm and normal heart sounds.  Exam reveals no gallop.   No murmur heard. Pulmonary/Chest: Effort normal and breath sounds normal. No respiratory  distress. She has no wheezes. She has no rales.  Lymphadenopathy:    She has no cervical adenopathy.  Neurological: She is alert and oriented to person, place, and time. No cranial nerve deficit. She displays a negative Romberg sign. Coordination and gait normal.  Unsteady walking but no frank ataxia  Psychiatric:  Very anxious and depressed about symptoms          Assessment & Plan:

## 2016-01-21 NOTE — Assessment & Plan Note (Signed)
Not clear if current symptoms are still related to this

## 2016-01-21 NOTE — Progress Notes (Signed)
Pre visit review using our clinic review tool, if applicable. No additional management support is needed unless otherwise documented below in the visit note. 

## 2016-01-21 NOTE — Assessment & Plan Note (Signed)
Not clear if this is related to head trauma in August or past cerebrovascular accidents Still on Worker's comp---but missing more work and her symptoms are worse Seeing neurologist in [place]--but no record of those visits  Will set up reevaluation here in [place] with neurologist--in case this is something different

## 2016-01-30 ENCOUNTER — Telehealth: Payer: Self-pay | Admitting: Internal Medicine

## 2016-01-30 NOTE — Telephone Encounter (Signed)
Received a call from [facility], Annabelle Harmanana. Dr Everlena CooperJaffe agreed to see the patient. Annabelle HarmanDana called Aram BeechamCynthia and she refused the appt. I tried to call the patient but could not get a call through. I tried getting her into [facility] but they wouldn't see her at all.

## 2016-01-31 NOTE — Telephone Encounter (Signed)
I can't imagine why she refused--but we will not try again

## 2016-02-13 ENCOUNTER — Telehealth: Payer: Self-pay | Admitting: Internal Medicine

## 2016-02-13 DIAGNOSIS — Z7689 Persons encountering health services in other specified circumstances: Secondary | ICD-10-CM

## 2016-02-13 NOTE — Telephone Encounter (Signed)
Form reviewed and signed thanks

## 2016-02-13 NOTE — Telephone Encounter (Signed)
[facility] faxed paperwork to be filled out In box for review and signature

## 2016-02-19 NOTE — Telephone Encounter (Signed)
Paperwork faxed 1/2 Pt aware Copy for file Copy for scan Copy for pt Copy for billing

## 2016-03-23 ENCOUNTER — Encounter: Payer: Self-pay | Admitting: Primary Care

## 2016-03-23 ENCOUNTER — Ambulatory Visit (INDEPENDENT_AMBULATORY_CARE_PROVIDER_SITE_OTHER): Payer: Managed Care, Other (non HMO) | Admitting: Primary Care

## 2016-03-23 VITALS — BP 154/94 | HR 102 | Temp 98.4°F | Ht 63.0 in | Wt 238.8 lb

## 2016-03-23 DIAGNOSIS — R112 Nausea with vomiting, unspecified: Secondary | ICD-10-CM | POA: Diagnosis not present

## 2016-03-23 MED ORDER — ONDANSETRON 4 MG PO TBDP: 4.0000 mg | ORAL_TABLET | Freq: Three times a day (TID) | ORAL | 0 refills | Status: DC | PRN

## 2016-03-23 NOTE — Progress Notes (Signed)
Pre visit review using our clinic review tool, if applicable. No additional management support is needed unless otherwise documented below in the visit note. 

## 2016-03-23 NOTE — Progress Notes (Signed)
Subjective:    Patient ID: Tami Jones, female    DOB: [birth year], 61 y.o.   MRN: [id number]  HPI  Tami Jones is a 61 year old female who presents today with a chief complaint of fatigue. She also reports body aches, chills, cough, shortness of breath, sore throat., diarrhea, nausea, vomiting. Her symptoms began 4 days ago. She's tolerating fluids without vomiting this afternoon with her last episode of vomiting being this morning. She had these symptoms several weeks ago which dissipated within several days. She's been around her grandchildren who've had the same symptoms. Her last fever was 2 days ago and she's not taken anything for fevers recently. Her temperature in the office today is 98.4.  Review of Systems  Constitutional: Positive for chills, fatigue and fever.  HENT: Positive for congestion, ear pain, sinus pressure and sore throat.   Respiratory: Positive for cough, shortness of breath and wheezing.   Gastrointestinal: Positive for diarrhea, nausea and vomiting.       Past Medical History:  Diagnosis Date  . Anxiety   . Asthma   . 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 Thad Ranger  . Diabetes mellitus   . GERD (gastroesophageal reflux disease)   . Hyperlipidemia   . Hypertension      Social History   Social History  . Marital status: Divorced    Spouse name: N/A  . Number of children: 2  . 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 Smoking status: Never Smoker  . Smokeless tobacco: Never Used  . Alcohol use No     Comment: wine occassionally  . Drug use: No  . Sexual activity: Yes    Birth control/ protection: Condom   Other Topics Concern  . Not on file   Social History Narrative  . No narrative on file    Past Surgical History:  Procedure Laterality Date  . CARDIOVASCULAR STRESS TEST  1/15   myoview. EF 60%  . CESAREAN SECTION    . ESOPHAGOGASTRODUODENOSCOPY   05/2005  . TONSILLECTOMY AND ADENOIDECTOMY      Family History  Problem Relation Age of Onset  . Coronary artery disease Mother   . Diabetes Mellitus II Mother   . Heart attack Mother 10  . Diabetes Mellitus II Maternal Grandmother     Allergies  Allergen Reactions  . Citalopram Other (See Comments)    Feels odd  . Doxycycline Hyclate Nausea And Vomiting  . Erythromycin Other (See Comments)    Irritated stomach  . Montelukast Sodium Itching  . Nitrofurantoin Nausea And Vomiting  . Sulfa Antibiotics Nausea And Vomiting  . Tramadol Hcl Other (See Comments)    Feels odd  . Venlafaxine Hcl Other (See Comments)    Feels odd  . Amoxicillin-Pot Clavulanate Nausea And Vomiting    Current Outpatient Prescriptions on File Prior to Visit  Medication Sig Dispense Refill  . amitriptyline (ELAVIL) 10 MG tablet 1 po qhs, increase to 2 po qhs if tolerated (Patient taking differently: Take 10 mg by mouth at bedtime as needed for sleep. ) 60 tablet 2  . aspirin EC 81 MG tablet Take 81 mg by mouth daily.    Marland Kitchen atorvastatin (LIPITOR) 20 MG tablet Take 1 tablet (20 mg total) by mouth daily. 90 tablet 3  . hydrochlorothiazide (HYDRODIURIL) 25 MG tablet TAKE ONE TABLET BY MOUTH ONCE DAILY 30 tablet 0  .  lisinopril (PRINIVIL,ZESTRIL) 20 MG tablet Take 1 tablet (20 mg total) by mouth daily. 90 tablet 3  . metoprolol succinate (TOPROL-XL) 100 MG 24 hr tablet TAKE 1 TABLET BY MOUTH  DAILY WITH OR IMMEDIATELY  FOLLOWING A MEAL 90 tablet 2  . omeprazole (PRILOSEC) 20 MG capsule Take 1 capsule (20 mg total) by mouth daily. 30 capsule 11  . OVER THE COUNTER MEDICATION Place 1 drop into both eyes daily as needed (dry eyes). Over the counter lubricating eye drop     No current facility-administered medications on file prior to visit.     BP (!) 154/94   Pulse (!) 102   Temp 98.4 F (36.9 C) (Oral)   Ht 5\' 3"  (1.6 m)   Wt 238 lb 12.8 oz (108.3 kg)   SpO2 98%   BMI 42.30 kg/m    Objective:    Physical Exam  Constitutional: She appears well-nourished.  HENT:  Right Ear: Ear canal normal. Tympanic membrane is bulging. Tympanic membrane is not erythematous.  Left Ear: Tympanic membrane and ear canal normal.  Nose: Right sinus exhibits no maxillary sinus tenderness and no frontal sinus tenderness. Left sinus exhibits no maxillary sinus tenderness and no frontal sinus tenderness.  Mouth/Throat: Oropharynx is clear and moist.  Eyes: Conjunctivae are normal.  Neck: Neck supple.  Cardiovascular: Normal rate and regular rhythm.   Pulmonary/Chest: Effort normal and breath sounds normal. She has no wheezes. She has no rales.  Abdominal: Soft. Normal appearance and bowel sounds are normal. There is generalized tenderness.  Lymphadenopathy:    She has no cervical adenopathy.  Skin: Skin is warm and dry.          Assessment & Plan:  Viral Illness:  Body aches, abdominal discomfort, nausea, vomiting, fevers x 4 days.  No fevers recently. Tolerating fluids this afternoon. Exam today with generalized tenderness, stable exam, lungs clear, vital stable. Rapid Flu: Unable to tolerate swab. Suspect viral illness and will treat with supportive measures. Discussed importance of hydration. Rx for Zofran provided to use PRN. She will advance diet as tolerated. Discussed strict return precautions such as inability to tolerate fluids, return of fevers, increased abdominal pain. She is stable for outpatient treatment.  Morrie Sheldonlark,Khamani Daniely Kendal, NP

## 2016-03-23 NOTE — Patient Instructions (Addendum)
Your symptoms are representative of a viral illness which will resolve on its own over time. Our goal is to treat your symptoms in order to aid your body in the healing process and to make you more comfortable.   Start Tylenol for body aches and fevers. Do not exceed 3000 mg in 24 hours. You could take Aleve for body aches and fevers instead.  You may take (ondansetron) Zofran every 8 hours as needed for nausea. This will help to reduce nausea so that you can eat and drink. Focus on eating bland foods.   You may take Delsym or Robitussin for cough. This may be purchased over the counter.   Ensure you are consuming 64 ounces of water daily.  Please call me if you can't keep liquids or solids down, you start running fevers of 101, you start coughing up thick, green mucous. Rest and stay hydrated.  It was a pleasure meeting you!

## 2016-03-24 ENCOUNTER — Telehealth: Payer: Self-pay

## 2016-03-24 NOTE — Telephone Encounter (Signed)
Printed and spoken to patient. She stated that she would like it to fax.  AttnUvaldo Rising: Amanda Dove Fax number is [phone]

## 2016-03-24 NOTE — Telephone Encounter (Signed)
Approved, please provide. Okay to be out 2-3 days.

## 2016-03-24 NOTE — Telephone Encounter (Signed)
Pt left v/m; pt seen 03/23/16; pt request doctors note for work. I tried to call pt but no answer and v/m not set up.

## 2016-03-25 NOTE — Telephone Encounter (Signed)
Work note was faxed this morning.

## 2016-03-26 ENCOUNTER — Telehealth: Payer: Self-pay

## 2016-03-26 DIAGNOSIS — J069 Acute upper respiratory infection, unspecified: Secondary | ICD-10-CM

## 2016-03-26 MED ORDER — HYDROCOD POLST-CPM POLST ER 10-8 MG/5ML PO SUER: 5.0000 mL | Freq: Two times a day (BID) | ORAL | 0 refills | Status: DC | PRN

## 2016-03-26 NOTE — Telephone Encounter (Signed)
Noted. Rx printed and ready for pick up.

## 2016-03-26 NOTE — Telephone Encounter (Signed)
Pt left v/m; pt seen 03/23/16; cough med is not helping; pt is coughing all the time and request different cough med. [facility] [address]. Pt request cb.

## 2016-03-26 NOTE — Telephone Encounter (Signed)
Spoken to patient. She is currently taking Robitussin and cough drops.  Patient would like the Rx for cough medication with codeine since the tessalon pearls does not help her.

## 2016-03-26 NOTE — Telephone Encounter (Signed)
What is she using for cough now? I can do one of two things: Send a non drowsy medication called tessalon pearls for cough to her pharmacy, or print her out an Rx for cough medication with codeine. The codeine may cause drowsiness. If she opts for the codeine then she will need to come pick it up before 5. Let me know.

## 2016-04-27 ENCOUNTER — Ambulatory Visit: Payer: Managed Care, Other (non HMO) | Admitting: Internal Medicine

## 2016-04-29 ENCOUNTER — Emergency Department
Admission: EM | Admit: 2016-04-29 | Discharge: 2016-04-29 | Disposition: A | Discharge status: Home / Self Care | Payer: Managed Care, Other (non HMO) | Attending: Emergency Medicine | Admitting: Emergency Medicine

## 2016-04-29 ENCOUNTER — Emergency Department: Payer: Managed Care, Other (non HMO)

## 2016-04-29 DIAGNOSIS — R197 Diarrhea, unspecified: Secondary | ICD-10-CM | POA: Insufficient documentation

## 2016-04-29 DIAGNOSIS — E119 Type 2 diabetes mellitus without complications: Secondary | ICD-10-CM | POA: Insufficient documentation

## 2016-04-29 DIAGNOSIS — J45909 Unspecified asthma, uncomplicated: Secondary | ICD-10-CM | POA: Insufficient documentation

## 2016-04-29 DIAGNOSIS — Z79899 Other long term (current) drug therapy: Secondary | ICD-10-CM | POA: Diagnosis not present

## 2016-04-29 DIAGNOSIS — R109 Unspecified abdominal pain: Secondary | ICD-10-CM | POA: Diagnosis present

## 2016-04-29 DIAGNOSIS — Z7982 Long term (current) use of aspirin: Secondary | ICD-10-CM | POA: Diagnosis not present

## 2016-04-29 DIAGNOSIS — I10 Essential (primary) hypertension: Secondary | ICD-10-CM | POA: Diagnosis not present

## 2016-04-29 DIAGNOSIS — R110 Nausea: Secondary | ICD-10-CM | POA: Insufficient documentation

## 2016-04-29 LAB — BASIC METABOLIC PANEL
ANION GAP: 8 (ref 5–15)
BUN: 18 mg/dL (ref 6–20)
CO2: 25 mmol/L (ref 22–32)
Calcium: 9 mg/dL (ref 8.9–10.3)
Chloride: 105 mmol/L (ref 101–111)
Creatinine, Ser: 1.32 mg/dL — ABNORMAL HIGH (ref 0.44–1.00)
GFR, EST AFRICAN AMERICAN: 50 mL/min — AB (ref >60)
GFR, EST NON AFRICAN AMERICAN: 43 mL/min — AB (ref >60)
Glucose, Bld: 223 mg/dL — ABNORMAL HIGH (ref 65–99)
Potassium: 3.8 mmol/L (ref 3.5–5.1)
Sodium: 138 mmol/L (ref 135–145)

## 2016-04-29 LAB — URINALYSIS, COMPLETE (UACMP) WITH MICROSCOPIC
Bacteria, UA: NONE SEEN
Bilirubin Urine: NEGATIVE
GLUCOSE, UA: 50 mg/dL — AB
Hgb urine dipstick: NEGATIVE
Ketones, ur: NEGATIVE mg/dL
Leukocytes, UA: NEGATIVE
Nitrite: NEGATIVE
PH: 5 (ref 5.0–8.0)
PROTEIN: NEGATIVE mg/dL
RBC / HPF: NONE SEEN RBC/hpf (ref 0–5)
SPECIFIC GRAVITY, URINE: 1.02 (ref 1.005–1.030)

## 2016-04-29 LAB — CBC
HEMATOCRIT: 40.6 % (ref 35.0–47.0)
Hemoglobin: 13.8 g/dL (ref 12.0–16.0)
MCH: 31.1 pg (ref 26.0–34.0)
MCHC: 34 g/dL (ref 32.0–36.0)
MCV: 91.4 fL (ref 80.0–100.0)
Platelets: 208 10*3/uL (ref 150–440)
RBC: 4.44 MIL/uL (ref 3.80–5.20)
RDW: 13.5 % (ref 11.5–14.5)
WBC: 5.9 10*3/uL (ref 3.6–11.0)

## 2016-04-29 MED ORDER — HYDROCODONE-ACETAMINOPHEN 5-325 MG PO TABS
ORAL_TABLET | ORAL | Status: AC
  Filled 2016-04-29: qty 2

## 2016-04-29 MED ORDER — HYDROCODONE-ACETAMINOPHEN 5-325 MG PO TABS
2.0000 | ORAL_TABLET | Freq: Once | ORAL | Status: AC
  Administered 2016-04-29: 2 via ORAL

## 2016-04-29 MED ORDER — ONDANSETRON 4 MG PO TBDP
ORAL_TABLET | ORAL | Status: AC
  Filled 2016-04-29: qty 1

## 2016-04-29 MED ORDER — ONDANSETRON 4 MG PO TBDP
4.0000 mg | ORAL_TABLET | Freq: Once | ORAL | Status: AC
  Administered 2016-04-29: 4 mg via ORAL

## 2016-04-29 MED ORDER — DICYCLOMINE HCL 20 MG PO TABS: 20.0000 mg | ORAL_TABLET | Freq: Three times a day (TID) | ORAL | 0 refills | Status: DC | PRN

## 2016-04-29 NOTE — ED Provider Notes (Signed)
[facility] Emergency Department Provider Note  Time seen: 3:35 PM  I have reviewed the triage vital signs and the nursing notes.   HISTORY  Chief Complaint Flank Pain    HPI Tami Jones is a 61 y.o. female with a past medical history of anxiety, asthma, diabetes, gastric reflux, hypertension, hyperlipidemia who presents to the emergency department with left-sided abdominal pain. According to the patient for the past 4 days she has been experiencing left flank pain. States the pain is somewhat intermittent, is affecting her left back and left lateral abdomen. States several episodes of diarrhea today, states nausea over the last few days but denies any vomiting. Denies any dysuria or hematuria. Denies black or bloody stool or vomit. Denies fever.  Past Medical History:  Diagnosis Date  . Anxiety   . Asthma   . 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 Thad Rangereynolds  . Diabetes mellitus   . GERD (gastroesophageal reflux disease)   . Hyperlipidemia   . Hypertension     Patient Active Problem List   Diagnosis Date Noted  . Vestibular dizziness 01/21/2016  . Concussion with no loss of consciousness 10/09/2015  . Fatigue 09/22/2015  . Tick bite 07/15/2015  . Type 2 diabetes, controlled, with renal manifestation (HCC) 04/26/2015  . Cerebrovascular disease 07/17/2014  . Preventative health care 06/25/2014  . Right sided sciatica 03/08/2013  . MENOPAUSAL SYNDROME 09/10/2008  . Mood disorder (HCC) 09/15/2007  . Essential hypertension, benign 08/10/2006  . Diabetes mellitus type 2, controlled, without complications (HCC) 06/17/2006  . HYPERCHOLESTEROLEMIA 06/17/2006  . GERD 06/17/2006    Past Surgical History:  Procedure Laterality Date  . CARDIOVASCULAR STRESS TEST  1/15   myoview. EF 60%  . CESAREAN SECTION    . ESOPHAGOGASTRODUODENOSCOPY  05/2005  . TONSILLECTOMY AND ADENOIDECTOMY      Prior to Admission medications    Medication Sig Start Date End Date Taking? Authorizing Provider  amitriptyline (ELAVIL) 10 MG tablet 1 po qhs, increase to 2 po qhs if tolerated Patient taking differently: Take 10 mg by mouth at bedtime as needed for sleep.  10/15/15   Hannah BeatSpencer Copland, MD  aspirin EC 81 MG tablet Take 81 mg by mouth daily.    Historical Provider, MD  atorvastatin (LIPITOR) 20 MG tablet Take 1 tablet (20 mg total) by mouth daily. 03/19/14   Karie Schwalbeichard I Letvak, MD  chlorpheniramine-HYDROcodone [facility](TUSSIONEX PENNKINETIC ER) 10-8 MG/5ML SUER Take 5 mLs by mouth every 12 (twelve) hours as needed for cough. 03/26/16   Doreene NestKatherine K Clark, NP  hydrochlorothiazide (HYDRODIURIL) 25 MG tablet TAKE ONE TABLET BY MOUTH ONCE DAILY 09/03/15   Vesta MixerPhilip J Nahser, MD  lisinopril (PRINIVIL,ZESTRIL) 20 MG tablet Take 1 tablet (20 mg total) by mouth daily. 04/25/15   Karie Schwalbeichard I Letvak, MD  metoprolol succinate (TOPROL-XL) 100 MG 24 hr tablet TAKE 1 TABLET BY MOUTH  DAILY WITH OR IMMEDIATELY  FOLLOWING A MEAL 01/14/16   Karie Schwalbeichard I Letvak, MD  omeprazole (PRILOSEC) 20 MG capsule Take 1 capsule (20 mg total) by mouth daily. 12/11/14   Karie Schwalbeichard I Letvak, MD  ondansetron (ZOFRAN ODT) 4 MG disintegrating tablet Take 1 tablet (4 mg total) by mouth every 8 (eight) hours as needed for nausea or vomiting. 03/23/16   Doreene NestKatherine K Clark, NP  OVER THE COUNTER MEDICATION Place 1 drop into both eyes daily as needed (dry eyes). Over the counter lubricating eye drop    Historical Provider, MD  Allergies  Allergen Reactions  . Citalopram Other (See Comments)    Feels odd  . Doxycycline Hyclate Nausea And Vomiting  . Erythromycin Other (See Comments)    Irritated stomach  . Montelukast Sodium Itching  . Nitrofurantoin Nausea And Vomiting  . Sulfa Antibiotics Nausea And Vomiting  . Tramadol Hcl Other (See Comments)    Feels odd  . Venlafaxine Hcl Other (See Comments)    Feels odd  . Amoxicillin-Pot Clavulanate Nausea And Vomiting    Family History  Problem  Relation Age of Onset  . Coronary artery disease Mother   . Diabetes Mellitus II Mother   . Heart attack Mother 7  . Diabetes Mellitus II Maternal Grandmother     Social History Social History  Substance Use Topics  . Smoking status: Never Smoker  . Smokeless tobacco: Never Used  . Alcohol use No     Comment: wine occassionally    Review of Systems Constitutional: Negative for fever. Cardiovascular: Negative for chest pain. Respiratory: Negative for shortness of breath. Gastrointestinal: Left flank pain. Positive for nausea. Several episodes of diarrhea today. Denies vomiting. Genitourinary: Negative for dysuria. Neurological: Negative for headache 10-point ROS otherwise negative.  ____________________________________________   PHYSICAL EXAM:  VITAL SIGNS: ED Triage Vitals [04/29/16 1354]  Enc Vitals Group     BP (!) 209/107     Pulse Rate 99     Resp 18     Temp 98.1 F (36.7 C)     Temp Source Oral     SpO2 100 %     Weight 237 lb (107.5 kg)     Height 5\' 4"  (1.626 m)     Head Circumference      Peak Flow      Pain Score 9     Pain Loc      Pain Edu?      Excl. in GC?     Constitutional: Alert and oriented. Well appearing and in no distress. Eyes: Normal exam ENT   Head: Normocephalic and atraumatic.   Mouth/Throat: Mucous membranes are moist. Cardiovascular: Normal rate, regular rhythm. No murmur Respiratory: Normal respiratory effort without tachypnea nor retractions. Breath sounds are clear Gastrointestinal: Soft and nontender. No distention. Musculoskeletal: Nontender with normal range of motion in all extremities. Neurologic:  Normal speech and language. No gross focal neurologic deficits  Skin:  Skin is warm, dry and intact.  Psychiatric: Mood and affect are normal.   ____________________________________________   RADIOLOGY  CT negative  ____________________________________________   INITIAL IMPRESSION / ASSESSMENT AND PLAN / ED  COURSE  Pertinent labs & imaging results that were available during my care of the patient were reviewed by me and considered in my medical decision making (see chart for details).  Patient presents the emergency department with left flank pain for the past 4 days. States mild nausea but denies vomiting, states several episodes of diarrhea today. Denies dysuria or hematuria. Patient has no left flank tenderness on exam, no left CVA tenderness. Benign abdominal exam. Patient's labs are normal including a normal white blood cell count, normal urinalysis. However given the patient's continued complaints of left flank pain we'll obtain a CT scan without contrast to further evaluate.  CT scan is negative. Overall the patient appears well, labs are reassuring. I discussed with normal abdominal pain return precautions. We'll discharge with Bentyl for symptomatic relief and have the patient follow up with GI medicine if symptoms do not improve in the next several days.  ____________________________________________  FINAL CLINICAL IMPRESSION(S) / ED DIAGNOSES  Left flank pain    Minna Antis, MD 04/29/16 1640

## 2016-04-29 NOTE — ED Notes (Signed)
States left flank pain that radiates down to her groin for 2 days, denies any blood in urine, states feeling nausea, at present pt awake and alert

## 2016-04-29 NOTE — ED Triage Notes (Signed)
Pt c/o left flank pain with nausea for the past 2-3 days. Denies Hx of kidney stones.

## 2016-05-05 ENCOUNTER — Telehealth: Payer: Self-pay | Admitting: Internal Medicine

## 2016-05-05 NOTE — Telephone Encounter (Signed)
[facility] - Client TELEPHONE ADVICE RECORD [facility] Patient Name: Tami CornerCYNTHIA Mandel DOB: 10/09/1955 Initial Comment Caller states they have frequent urination and abdominal pain Nurse Assessment Nurse: Clarita LeberWomble, RN, Deborah Date/Time (Eastern Time): 05/05/2016 5:18:30 PM Confirm and document reason for call. If symptomatic, describe symptoms. ---The caller states that she is having frequency and abdominal pain. She states that she was seen in the ER last Thursday and was told that she has IB but she disagrees. She states that she is having pain in abdominal on the left. She states that she had scans and they found nothing. They gave her 2 Valium tablets. She states that she was supposed to have a physical on the 12th but it snowed. Does the patient have any new or worsening symptoms? ---Yes Will a triage be completed? ---YesRelated visit to physician within the last 2 weeks? ---No Does the PT have any chronic conditions? (i.e. diabetes, asthma, etc.) ---Yes List chronic conditions. ---anxiety, hypertension Is this a behavioral health or substance abuse call? ---No Guidelines Guideline Title Affirmed Question Affirmed Notes Abdominal Pain - Female [1] MILD (e.g., does not interfere with normal activities) AND [2] pain comes and goes (cramps) AND [3] present > 48 hours Final Disposition User See Physician within 24 Hours Womble, RN, Gavin PoundDeborah Comments The caller states that she would like to be called at her daughter's number of [phone]. made appt in slot that said same day bc this is her MD that was available and she [facility] and she wanted to comeafter 4 pm bc of this attempted to reach caller with appt time and left generic message for her to call office caller called back and told her appointment time and she will come tom at 4:30  pm Referrals REFERRED TO PCP OFFICE Disagree/Comply: Comply Call Id: [id number]

## 2016-05-06 ENCOUNTER — Encounter: Payer: Self-pay | Admitting: Internal Medicine

## 2016-05-06 ENCOUNTER — Encounter (INDEPENDENT_AMBULATORY_CARE_PROVIDER_SITE_OTHER): Payer: Self-pay

## 2016-05-06 ENCOUNTER — Ambulatory Visit (INDEPENDENT_AMBULATORY_CARE_PROVIDER_SITE_OTHER): Payer: Managed Care, Other (non HMO) | Admitting: Internal Medicine

## 2016-05-06 VITALS — BP 148/90 | HR 82 | Temp 98.2°F | Wt 240.0 lb

## 2016-05-06 DIAGNOSIS — R109 Unspecified abdominal pain: Secondary | ICD-10-CM | POA: Insufficient documentation

## 2016-05-06 LAB — POC URINALSYSI DIPSTICK (AUTOMATED)
BILIRUBIN UA: NEGATIVE
GLUCOSE UA: NEGATIVE
Ketones, UA: NEGATIVE
Leukocytes, UA: NEGATIVE
NITRITE UA: NEGATIVE
PH UA: 6 (ref 5.0–8.0)
PROTEIN UA: NEGATIVE
RBC UA: NEGATIVE
Spec Grav, UA: 1.03 (ref 1.030–1.035)
Urobilinogen, UA: 0.2 (ref ?–2.0)

## 2016-05-06 MED ORDER — CEPHALEXIN 500 MG PO CAPS: 500.0000 mg | ORAL_CAPSULE | Freq: Three times a day (TID) | ORAL | 0 refills | Status: DC

## 2016-05-06 NOTE — Assessment & Plan Note (Signed)
Some bladder symptoms--so will try antibiotic for 3 days CT negative Not clear what is going on Okay to try the dicyclomine to see if that helps

## 2016-05-06 NOTE — Telephone Encounter (Signed)
Pt has appt 05/06/16 at 4:30 with Dr Alphonsus SiasLetvak.

## 2016-05-06 NOTE — Progress Notes (Signed)
Subjective:    Patient ID: Tami Jones, female    DOB: 04/05/1955, 61 y.o.   MRN: [id number]  HPI Here due to ongoing flank pain  Started with left mid flank pain around 2 weeks ago Got worse and kept her from sleeping Also noticed lower abdominal fullness and discomfort Concerned about stone or infection BP also up then Reviewed notes---CT fine. Creatinine mildly elevated She got something for spasm (dicyclomine)--but didn't fill it  Has noted some urinary urgency and now occasional incontinence Still sore in left side Some nausea No hematuria  No dysuria  Current Outpatient Prescriptions on File Prior to Visit  Medication Sig Dispense Refill  . amitriptyline (ELAVIL) 10 MG tablet 1 po qhs, increase to 2 po qhs if tolerated (Patient taking differently: Take 10 mg by mouth at bedtime as needed for sleep. ) 60 tablet 2  . aspirin EC 81 MG tablet Take 81 mg by mouth daily.    Marland Kitchen. atorvastatin (LIPITOR) 20 MG tablet Take 1 tablet (20 mg total) by mouth daily. 90 tablet 3  . chlorpheniramine-HYDROcodone (TUSSIONEX PENNKINETIC ER) 10-8 MG/5ML SUER Take 5 mLs by mouth every 12 (twelve) hours as needed for cough. 70 mL 0  . hydrochlorothiazide (HYDRODIURIL) 25 MG tablet TAKE ONE TABLET BY MOUTH ONCE DAILY 30 tablet 0  . lisinopril (PRINIVIL,ZESTRIL) 20 MG tablet Take 1 tablet (20 mg total) by mouth daily. 90 tablet 3  . metoprolol succinate (TOPROL-XL) 100 MG 24 hr tablet TAKE 1 TABLET BY MOUTH  DAILY WITH OR IMMEDIATELY  FOLLOWING A MEAL 90 tablet 2  . omeprazole (PRILOSEC) 20 MG capsule Take 1 capsule (20 mg total) by mouth daily. 30 capsule 11  . ondansetron (ZOFRAN ODT) 4 MG disintegrating tablet Take 1 tablet (4 mg total) by mouth every 8 (eight) hours as needed for nausea or vomiting. 20 tablet 0  . OVER THE COUNTER MEDICATION Place 1 drop into both eyes daily as needed (dry eyes). Over the counter lubricating eye drop    . dicyclomine (BENTYL) 20 MG tablet Take 1 tablet (20 mg  total) by mouth 3 (three) times daily as needed for spasms. (Patient not taking: Reported on 05/06/2016) 30 tablet 0   No current facility-administered medications on file prior to visit.     Allergies  Allergen Reactions  . Citalopram Other (See Comments)    Feels odd  . Doxycycline Hyclate Nausea And Vomiting  . Erythromycin Other (See Comments)    Irritated stomach  . Montelukast Sodium Itching  . Nitrofurantoin Nausea And Vomiting  . Sulfa Antibiotics Nausea And Vomiting  . Tramadol Hcl Other (See Comments)    Feels odd  . Venlafaxine Hcl Other (See Comments)    Feels odd  . Amoxicillin-Pot Clavulanate Nausea And Vomiting    Past Medical History:  Diagnosis Date  . Anxiety   . Asthma   . 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 Thad Rangereynolds  . Diabetes mellitus   . GERD (gastroesophageal reflux disease)   . Hyperlipidemia   . Hypertension     Past Surgical History:  Procedure Laterality Date  . CARDIOVASCULAR STRESS TEST  1/15   myoview. EF 60%  . CESAREAN SECTION    . ESOPHAGOGASTRODUODENOSCOPY  05/2005  . TONSILLECTOMY AND ADENOIDECTOMY      Family History  Problem Relation Age of Onset  . Coronary artery disease Mother   . Diabetes Mellitus II Mother   . Heart attack Mother [id number]  .  Diabetes Mellitus II Maternal Grandmother     Social History   Social History  . Marital status: Divorced    Spouse name: N/A  . Number of children: 2  . 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 Smoking status: Never Smoker  . Smokeless tobacco: Never Used  . Alcohol use No     Comment: wine occassionally  . Drug use: No  . Sexual activity: Yes    Birth control/ protection: Condom   Other Topics Concern  . Not on file   Social History Narrative  . No narrative on file   Review of Systems No fever Chills one day Appetite is variable Some night time cough---?bronchitis (feels  it in her chest) No SOB other than with activity     Objective:   Physical Exam  Constitutional: No distress.  Neck: No thyromegaly present.  Cardiovascular: Normal rate, regular rhythm and normal heart sounds.  Exam reveals no gallop.   No murmur heard. Pulmonary/Chest: Effort normal and breath sounds normal. No respiratory distress. She has no wheezes. She has no rales.  Abdominal: Soft. She exhibits no distension. There is no tenderness. There is no rebound and no guarding.  Mild suprapubic and LLQ tenderness  Musculoskeletal:  Pain area along left flank from CVA all the way to pelvis No muscle tenderness Normal ROM of hips  Lymphadenopathy:    She has no cervical adenopathy.  Psychiatric:  Anxious as usual          Assessment & Plan:

## 2016-05-06 NOTE — Patient Instructions (Signed)
Try the 3 days of the antibiotic. If the pain persists, try the anti-spasmotic they gave you (dicyclomine) to see if it helps.

## 2016-05-06 NOTE — Telephone Encounter (Signed)
Will evaluate at the OV 

## 2016-05-06 NOTE — Progress Notes (Signed)
Pre visit review using our clinic review tool, if applicable. No additional management support is needed unless otherwise documented below in the visit note. 

## 2016-05-06 NOTE — Addendum Note (Signed)
Addended by: Eual FinesBRIDGES, Merin Borjon P on: 05/06/2016 05:31 PM   Modules accepted: Orders

## 2016-06-22 ENCOUNTER — Other Ambulatory Visit: Payer: Self-pay | Admitting: Internal Medicine

## 2016-06-22 NOTE — Telephone Encounter (Signed)
Pt want to ck on status of metoprolol. Advised pt refill already done at [facility] [address]. Pt will ck with pharmacy.

## 2016-06-23 ENCOUNTER — Telehealth: Payer: Self-pay

## 2016-06-23 NOTE — Telephone Encounter (Signed)
Tried to call pt to see how she was doing. We had received notes from [facility] in [place] from 06-19-16. She does not have VM so I could npt leave a message.

## 2016-06-24 NOTE — Telephone Encounter (Signed)
Looks as though the pt has an appt next week to F/U ER visit

## 2016-06-29 ENCOUNTER — Ambulatory Visit: Payer: Managed Care, Other (non HMO) | Admitting: Internal Medicine

## 2016-07-01 ENCOUNTER — Ambulatory Visit: Payer: Managed Care, Other (non HMO) | Admitting: Internal Medicine

## 2016-07-17 ENCOUNTER — Other Ambulatory Visit: Payer: Self-pay | Admitting: Internal Medicine

## 2016-09-29 ENCOUNTER — Other Ambulatory Visit: Payer: Self-pay | Admitting: Internal Medicine

## 2016-09-29 ENCOUNTER — Telehealth: Payer: Self-pay | Admitting: Internal Medicine

## 2016-09-29 NOTE — Telephone Encounter (Signed)
Thank you :)

## 2016-09-29 NOTE — Telephone Encounter (Signed)
Patient has been going to rehab in [place] every other week.  Patient said she needs her biometric screening filled out for work before 10/08/16.  Can patient be fit in before 10/08/16 and if so, when?

## 2016-09-29 NOTE — Telephone Encounter (Signed)
I spoke to patient and scheduled appointment with Dr.Letvak on 10/06/16 at 12:15.  Patient asked me to tell Dr.Letvak thank you for fitting her in.

## 2016-09-29 NOTE — Telephone Encounter (Signed)
Thanks

## 2016-09-29 NOTE — Telephone Encounter (Signed)
If you can find a spot, I am okay with it. Potentially we could do the form with a nurse visit for the measurements and do blood work--and then have her physical later

## 2016-10-06 ENCOUNTER — Encounter: Payer: Self-pay | Admitting: Internal Medicine

## 2017-01-13 ENCOUNTER — Encounter: Payer: Self-pay | Admitting: Internal Medicine

## 2017-01-24 ENCOUNTER — Encounter: Payer: Self-pay | Admitting: Internal Medicine

## 2017-02-16 ENCOUNTER — Ambulatory Visit: Payer: Self-pay

## 2017-02-16 NOTE — Telephone Encounter (Signed)
Pt calling with c/o cough x 4 weeks. Pt states cough has gotten better then worse but has never went away. She has wheezing. No availabilty today Appt made for tomorrow with Dr Patsy Lageropland @ 1215  Reason for Disposition . [1] Ankle swelling AND [2] swelling is increasing  Answer Assessment - Initial Assessment Questions 1. ONSET: "When did the cough begin?"      4 weeks ago 2. SEVERITY: "How bad is the cough today?"      Having frequent coughing episodes 3. RESPIRATORY DISTRESS: "Describe your breathing."      Wheezing. Can feel tightness in chest with coughing.  4. FEVER: "Do you have a fever?" If so, ask: "What is your temperature, how was it measured, and when did it start?"     no 5. HEMOPTYSIS: "Are you coughing up any blood?" If so ask: "How much?" (flecks, streaks, tablespoons, etc.)     no 6. TREATMENT: "What have you done so far to treat the cough?" (e.g., meds, fluids, humidifier)     clorecidin HBP, fluids,no humidifier 7. CARDIAC HISTORY: "Do you have any history of heart disease?" (e.g., heart attack, congestive heart failure)      HTN 8. LUNG HISTORY: "Do you have any history of lung disease?"  (e.g., pulmonary embolus, asthma, emphysema)     no 9. PE RISK FACTORS: "Do you have a history of blood clots?" (or: recent major surgery, recent prolonged travel, bedridden )     no 10. OTHER SYMPTOMS: "Do you have any other symptoms? (e.g., runny nose, wheezing, chest pain)       Runny nose intermittently, wheezing, Chest tightness, "left lung hurts when taking a deep breath in" 11. PREGNANCY: "Is there any chance you are pregnant?" "When was your last menstrual period?"       n/a 12. TRAVEL: "Have you traveled out of the country in the last month?" (e.g., travel history, exposures)       no  Protocols used: COUGH - ACUTE NON-PRODUCTIVE-A-AH

## 2017-02-17 ENCOUNTER — Encounter: Payer: Self-pay | Admitting: Family Medicine

## 2017-02-17 ENCOUNTER — Other Ambulatory Visit: Payer: Self-pay

## 2017-02-17 ENCOUNTER — Ambulatory Visit: Payer: Managed Care, Other (non HMO) | Admitting: Family Medicine

## 2017-02-17 VITALS — BP 150/84 | HR 68 | Temp 97.9°F | Ht 64.0 in | Wt 239.5 lb

## 2017-02-17 DIAGNOSIS — R5383 Other fatigue: Secondary | ICD-10-CM | POA: Diagnosis not present

## 2017-02-17 DIAGNOSIS — Z13220 Encounter for screening for lipoid disorders: Secondary | ICD-10-CM

## 2017-02-17 DIAGNOSIS — E119 Type 2 diabetes mellitus without complications: Secondary | ICD-10-CM

## 2017-02-17 DIAGNOSIS — Z79899 Other long term (current) drug therapy: Secondary | ICD-10-CM | POA: Diagnosis not present

## 2017-02-17 DIAGNOSIS — J189 Pneumonia, unspecified organism: Secondary | ICD-10-CM | POA: Diagnosis not present

## 2017-02-17 DIAGNOSIS — E7800 Pure hypercholesterolemia, unspecified: Secondary | ICD-10-CM

## 2017-02-17 MED ORDER — CLARITHROMYCIN 500 MG PO TABS: 500.0000 mg | ORAL_TABLET | Freq: Two times a day (BID) | ORAL | 0 refills | Status: DC

## 2017-02-17 MED ORDER — OMEPRAZOLE 20 MG PO CPDR: 20.0000 mg | DELAYED_RELEASE_CAPSULE | Freq: Every day | ORAL | 3 refills | Status: DC

## 2017-02-17 MED ORDER — ATORVASTATIN CALCIUM 20 MG PO TABS: 20.0000 mg | ORAL_TABLET | Freq: Every day | ORAL | 3 refills | Status: DC

## 2017-02-17 NOTE — Progress Notes (Signed)
Dr. Karleen HampshireSpencer T. Adlee Paar, MD, CAQ Sports Medicine [facility] [address] [place], [id number] Phone: [phone] Fax: [phone]  02/17/2017  Patient: Tami ArbourCynthia B Jones, MRN: [id number], DOB: 05/20/1955, 62 y.o.  Primary Physician:  Karie SchwalbeLetvak, Richard I, MD   Chief Complaint  Patient presents with  . Cough    Hurts to breath  . Wheezing  . Hyperglycemia   Subjective:   Tami SalviaCynthia B Jones is a 62 y.o. very pleasant female patient who presents with the following:  Cough x 4 weeks, now come back and off and on. Felt some wheezing. No h/o lung problems.  Hurts in back and in the chest.  Globally, she has not felt all that well.  She also feels like she has been having some wheezing intermittently, particularly with activity.  She also is overdue for laboratories, and a recent physical was canceled secondary to snow.  She does have a diagnosis of diabetes, and her blood sugars have been elevated when she has checked them recently.  Greater than 200 at times.  Past Medical History, Surgical History, Social History, Family History, Problem List, Medications, and Allergies have been reviewed and updated if relevant.  Patient Active Problem List   Diagnosis Date Noted  . Left flank pain 05/06/2016  . Vestibular dizziness 01/21/2016  . Concussion with no loss of consciousness 10/09/2015  . Fatigue 09/22/2015  . Tick bite 07/15/2015  . Type 2 diabetes, controlled, with renal manifestation (HCC) 04/26/2015  . Cerebrovascular disease 07/17/2014  . Preventative health care 06/25/2014  . Right sided sciatica 03/08/2013  . MENOPAUSAL SYNDROME 09/10/2008  . Mood disorder (HCC) 09/15/2007  . Essential hypertension, benign 08/10/2006  . Diabetes mellitus type 2, controlled, without complications (HCC) 06/17/2006  . HYPERCHOLESTEROLEMIA 06/17/2006  . GERD 06/17/2006    Past Medical History:  Diagnosis Date  . Anxiety   . Asthma   . CVA (cerebral vascular  accident) (HCC) 03/2006   right occipital, Dr. Thad Rangereynolds  . Diabetes mellitus   . GERD (gastroesophageal reflux disease)   . Hyperlipidemia   . Hypertension     Past Surgical History:  Procedure Laterality Date  . CARDIOVASCULAR STRESS TEST  1/15   myoview. EF 60%  . CESAREAN SECTION    . ESOPHAGOGASTRODUODENOSCOPY  05/2005  . TONSILLECTOMY AND ADENOIDECTOMY      Social History   Socioeconomic History  . Marital status: Divorced    Spouse name: Not on file  . Number of children: 2  . Years of education: Not on file  . Highest education level: Not on file  Social Needs  . Financial resource strain: Not on file  . Food insecurity - worry: Not on file  . Food insecurity - inability: Not on file  . Transportation needs - medical: Not on file  . Transportation needs - non-medical: Not on file  Occupational History  . Occupation: LAB TECH    Employer: [employer]  Tobacco Use  . Smoking status: Never Smoker  . Smokeless tobacco: Never Used  Substance and Sexual Activity  . Alcohol use: No    Alcohol/week: 0.0 oz    Comment: wine occassionally  . Drug use: No  . Sexual activity: Yes    Birth control/protection: Condom  Other Topics Concern  . Not on file  Social History Narrative  . Not on file    Family History  Problem Relation Age of Onset  . Coronary artery disease Mother   . Diabetes Mellitus II  Mother   . Heart attack Mother 16  . Diabetes Mellitus II Maternal Grandmother     Allergies  Allergen Reactions  . Citalopram Other (See Comments)    Feels odd  . Doxycycline Hyclate Nausea And Vomiting  . Erythromycin Other (See Comments)    Irritated stomach  . Montelukast Sodium Itching  . Nitrofurantoin Nausea And Vomiting  . Sulfa Antibiotics Nausea And Vomiting  . Tramadol Hcl Other (See Comments)    Feels odd  . Venlafaxine Hcl Other (See Comments)    Feels odd  . Amoxicillin-Pot Clavulanate Nausea And Vomiting    Medication list reviewed and updated  in full in  Link.  ROS: GEN: Acute illness details above GI: Tolerating PO intake GU: maintaining adequate hydration and urination Pulm: No SOB Interactive and getting along well at home.  Otherwise, ROS is as per the HPI.  Objective:   BP (!) 150/84   Pulse 68   Temp 97.9 F (36.6 C) (Oral)   Ht 5\' 4"  (1.626 m)   Wt 239 lb 8 oz (108.6 kg)   SpO2 98%   BMI 41.11 kg/m    GEN: WDWN, NAD, Non-toxic, A & O x 3 HEENT: Atraumatic, Normocephalic. Neck supple. No masses, No LAD. Ears and Nose: No external deformity. CV: RRR, No M/G/R. No JVD. No thrill. No extra heart sounds. PULM: No wheezing, but B crackles at lung bases.  No retractions. No resp. distress. No accessory muscle use. EXTR: No c/c/e NEURO Normal gait.  PSYCH: Normally interactive. Conversant. Not depressed or anxious appearing.  Calm demeanor.     Laboratory and Imaging Data:  Assessment and Plan:   Walking pneumonia  Other fatigue - Plan: TSH, T3, free, T4, free  Controlled type 2 diabetes mellitus without complication, without long-term current use of insulin (HCC) - Plan: Hemoglobin A1c, Basic metabolic panel, Microalbumin / creatinine urine ratio  HYPERCHOLESTEROLEMIA  Encounter for long-term (current) use of medications - Plan: Hepatic function panel, CBC with Differential/Platelet  Screening, lipid - Plan: Lipid panel  Crackles in both lung bases with coughing for a month raises concern for possible pneumonia.  In this case, I am going to cover her with atypical coverage with Biaxin.  She is also overdue for some laboratories and has an upcoming physical and wanted to go ahead and do them today.  Follow-up: No Follow-up on file.  Future Appointments  Date Time Provider Department Center  04/08/2017  8:00 AM Karie Schwalbe, MD [facility]    Meds ordered this encounter  Medications  . omeprazole (PRILOSEC) 20 MG capsule    Sig: Take 1 capsule (20 mg total) by mouth daily.     Dispense:  90 capsule    Refill:  3  . atorvastatin (LIPITOR) 20 MG tablet    Sig: Take 1 tablet (20 mg total) by mouth daily.    Dispense:  90 tablet    Refill:  3  . DISCONTD: clarithromycin (BIAXIN) 500 MG tablet    Sig: Take 1 tablet (500 mg total) by mouth 2 (two) times daily.    Dispense:  20 tablet    Refill:  0   Medications Discontinued During This Encounter  Medication Reason  . cephALEXin (KEFLEX) 500 MG capsule Completed Course  . chlorpheniramine-HYDROcodone (TUSSIONEX PENNKINETIC ER) 10-8 MG/5ML SUER Completed Course  . dicyclomine (BENTYL) 20 MG tablet Completed Course  . metoprolol succinate (TOPROL-XL) 100 MG 24 hr tablet Duplicate  . ondansetron (ZOFRAN ODT) 4 MG disintegrating  tablet Completed Course  . omeprazole (PRILOSEC) 20 MG capsule Reorder  . atorvastatin (LIPITOR) 20 MG tablet Reorder   Orders Placed This Encounter  Procedures  . TSH  . T3, free  . T4, free  . Lipid panel  . Hemoglobin A1c  . Hepatic function panel  . CBC with Differential/Platelet  . Basic metabolic panel  . Microalbumin / creatinine urine ratio    Signed,  Arsal Tappan T. Moranda Billiot, MD   Allergies as of 02/17/2017      Reactions   Citalopram Other (See Comments)   Feels odd   Doxycycline Hyclate Nausea And Vomiting   Erythromycin Other (See Comments)   Irritated stomach   Montelukast Sodium Itching   Nitrofurantoin Nausea And Vomiting   Sulfa Antibiotics Nausea And Vomiting   Tramadol Hcl Other (See Comments)   Feels odd   Venlafaxine Hcl Other (See Comments)   Feels odd   Amoxicillin-pot Clavulanate Nausea And Vomiting      Medication List        Accurate as of 02/17/17 11:59 PM. Always use your most recent med list.          amitriptyline 10 MG tablet Commonly known as:  ELAVIL 1 po qhs, increase to 2 po qhs if tolerated   aspirin EC 81 MG tablet Take 81 mg by mouth daily.   atorvastatin 20 MG tablet Commonly known as:  LIPITOR Take 1 tablet (20 mg total) by  mouth daily.   clarithromycin 500 MG tablet Commonly known as:  BIAXIN Take 1 tablet (500 mg total) by mouth 2 (two) times daily.   hydrochlorothiazide 25 MG tablet Commonly known as:  HYDRODIURIL TAKE ONE TABLET BY MOUTH ONCE DAILY   lisinopril 20 MG tablet Commonly known as:  PRINIVIL,ZESTRIL TAKE 1 TABLET BY MOUTH ONCE DAILY   metoprolol succinate 100 MG 24 hr tablet Commonly known as:  TOPROL-XL TAKE 1 TABLET BY MOUTH  DAILY WITH OR IMMEDIATELY  FOLLOWING A MEAL   omeprazole 20 MG capsule Commonly known as:  PRILOSEC Take 1 capsule (20 mg total) by mouth daily.   OVER THE COUNTER MEDICATION Place 1 drop into both eyes daily as needed (dry eyes). Over the counter lubricating eye drop

## 2017-02-18 ENCOUNTER — Other Ambulatory Visit: Payer: Self-pay

## 2017-02-18 ENCOUNTER — Encounter: Payer: Self-pay | Admitting: Emergency Medicine

## 2017-02-18 ENCOUNTER — Emergency Department: Payer: Managed Care, Other (non HMO)

## 2017-02-18 ENCOUNTER — Telehealth: Payer: Self-pay

## 2017-02-18 ENCOUNTER — Emergency Department
Admission: EM | Admit: 2017-02-18 | Discharge: 2017-02-18 | Disposition: A | Discharge status: Home / Self Care | Payer: Managed Care, Other (non HMO) | Attending: Emergency Medicine | Admitting: Emergency Medicine

## 2017-02-18 DIAGNOSIS — Z8673 Personal history of transient ischemic attack (TIA), and cerebral infarction without residual deficits: Secondary | ICD-10-CM | POA: Insufficient documentation

## 2017-02-18 DIAGNOSIS — Z7982 Long term (current) use of aspirin: Secondary | ICD-10-CM | POA: Diagnosis not present

## 2017-02-18 DIAGNOSIS — T7840XA Allergy, unspecified, initial encounter: Secondary | ICD-10-CM

## 2017-02-18 DIAGNOSIS — J45909 Unspecified asthma, uncomplicated: Secondary | ICD-10-CM | POA: Insufficient documentation

## 2017-02-18 DIAGNOSIS — E119 Type 2 diabetes mellitus without complications: Secondary | ICD-10-CM | POA: Diagnosis not present

## 2017-02-18 DIAGNOSIS — I10 Essential (primary) hypertension: Secondary | ICD-10-CM | POA: Diagnosis not present

## 2017-02-18 DIAGNOSIS — L299 Pruritus, unspecified: Secondary | ICD-10-CM | POA: Diagnosis present

## 2017-02-18 DIAGNOSIS — Z79899 Other long term (current) drug therapy: Secondary | ICD-10-CM | POA: Insufficient documentation

## 2017-02-18 LAB — BASIC METABOLIC PANEL
ANION GAP: 10 (ref 5–15)
BUN/Creatinine Ratio: 14 (ref 12–28)
BUN: 14 mg/dL (ref 8–27)
BUN: 16 mg/dL (ref 6–20)
CALCIUM: 9.3 mg/dL (ref 8.7–10.3)
CO2: 24 mmol/L (ref 20–29)
CO2: 25 mmol/L (ref 22–32)
Calcium: 9.1 mg/dL (ref 8.9–10.3)
Chloride: 102 mmol/L (ref 96–106)
Chloride: 105 mmol/L (ref 101–111)
Creatinine, Ser: 1.03 mg/dL — ABNORMAL HIGH (ref 0.57–1.00)
Creatinine, Ser: 1.05 mg/dL — ABNORMAL HIGH (ref 0.44–1.00)
GFR calc Af Amer: >60 mL/min (ref >60)
GFR, EST AFRICAN AMERICAN: 68 mL/min/{1.73_m2} (ref >59)
GFR, EST NON AFRICAN AMERICAN: 59 mL/min/{1.73_m2} — AB (ref >59)
GFR, EST NON AFRICAN AMERICAN: 57 mL/min — AB (ref >60)
Glucose, Bld: 229 mg/dL — ABNORMAL HIGH (ref 65–99)
Glucose: 151 mg/dL — ABNORMAL HIGH (ref 65–99)
POTASSIUM: 4.3 mmol/L (ref 3.5–5.1)
Potassium: 4.4 mmol/L (ref 3.5–5.2)
SODIUM: 140 mmol/L (ref 135–145)
Sodium: 140 mmol/L (ref 134–144)

## 2017-02-18 LAB — MICROALBUMIN / CREATININE URINE RATIO
Creatinine, Urine: 213.2 mg/dL
MICROALB/CREAT RATIO: 5.4 mg/g{creat} (ref 0.0–30.0)
Microalbumin, Urine: 11.5 ug/mL

## 2017-02-18 LAB — HEPATIC FUNCTION PANEL
ALBUMIN: 4.2 g/dL (ref 3.6–4.8)
ALK PHOS: 92 IU/L (ref 39–117)
ALT: 26 IU/L (ref 0–32)
AST: 19 IU/L (ref 0–40)
BILIRUBIN TOTAL: 0.4 mg/dL (ref 0.0–1.2)
Bilirubin, Direct: 0.09 mg/dL (ref 0.00–0.40)
TOTAL PROTEIN: 6.8 g/dL (ref 6.0–8.5)

## 2017-02-18 LAB — CBC WITH DIFFERENTIAL/PLATELET
BASOS: 0 %
Basophils Absolute: 0 10*3/uL (ref 0.0–0.2)
EOS (ABSOLUTE): 0.1 10*3/uL (ref 0.0–0.4)
EOS: 2 %
HEMATOCRIT: 41.5 % (ref 34.0–46.6)
Hemoglobin: 13.9 g/dL (ref 11.1–15.9)
IMMATURE GRANS (ABS): 0 10*3/uL (ref 0.0–0.1)
IMMATURE GRANULOCYTES: 0 %
LYMPHS: 34 %
Lymphocytes Absolute: 1.8 10*3/uL (ref 0.7–3.1)
MCH: 30.8 pg (ref 26.6–33.0)
MCHC: 33.5 g/dL (ref 31.5–35.7)
MCV: 92 fL (ref 79–97)
MONOS ABS: 0.4 10*3/uL (ref 0.1–0.9)
Monocytes: 7 %
NEUTROS ABS: 2.9 10*3/uL (ref 1.4–7.0)
NEUTROS PCT: 57 %
Platelets: 240 10*3/uL (ref 150–379)
RBC: 4.52 x10E6/uL (ref 3.77–5.28)
RDW: 12.9 % (ref 12.3–15.4)
WBC: 5.2 10*3/uL (ref 3.4–10.8)

## 2017-02-18 LAB — CBC
HEMATOCRIT: 39.7 % (ref 35.0–47.0)
HEMOGLOBIN: 13.5 g/dL (ref 12.0–16.0)
MCH: 31.3 pg (ref 26.0–34.0)
MCHC: 34.1 g/dL (ref 32.0–36.0)
MCV: 91.8 fL (ref 80.0–100.0)
Platelets: 207 10*3/uL (ref 150–440)
RBC: 4.32 MIL/uL (ref 3.80–5.20)
RDW: 12.9 % (ref 11.5–14.5)
WBC: 6.5 10*3/uL (ref 3.6–11.0)

## 2017-02-18 LAB — LIPID PANEL
CHOL/HDL RATIO: 6.1 ratio — AB (ref 0.0–4.4)
Cholesterol, Total: 238 mg/dL — ABNORMAL HIGH (ref 100–199)
HDL: 39 mg/dL — AB (ref >39)
LDL Calculated: 164 mg/dL — ABNORMAL HIGH (ref 0–99)
TRIGLYCERIDES: 174 mg/dL — AB (ref 0–149)
VLDL Cholesterol Cal: 35 mg/dL (ref 5–40)

## 2017-02-18 LAB — T3, FREE: T3 FREE: 3.1 pg/mL (ref 2.0–4.4)

## 2017-02-18 LAB — TSH: TSH: 1.98 u[IU]/mL (ref 0.450–4.500)

## 2017-02-18 LAB — TROPONIN I

## 2017-02-18 LAB — T4, FREE: Free T4: 1.5 ng/dL (ref 0.82–1.77)

## 2017-02-18 LAB — HEMOGLOBIN A1C
Est. average glucose Bld gHb Est-mCnc: 197 mg/dL
Hgb A1c MFr Bld: 8.5 % — ABNORMAL HIGH (ref 4.8–5.6)

## 2017-02-18 MED ORDER — DIPHENHYDRAMINE HCL 25 MG PO CAPS
50.0000 mg | ORAL_CAPSULE | Freq: Once | ORAL | Status: AC
Start: 2017-02-18 — End: 2017-02-18
  Administered 2017-02-18: 50 mg via ORAL
  Filled 2017-02-18: qty 2

## 2017-02-18 NOTE — Telephone Encounter (Signed)
Per chart review pt went to [facility] ED today.

## 2017-02-18 NOTE — ED Provider Notes (Signed)
[facility] Emergency Department Provider Note  ____________________________________________   First MD Initiated Contact with Patient 02/18/17 0430     (approximate)  I have reviewed the triage vital signs and the nursing notes.   HISTORY  Chief Complaint Hypertension   HPI Tami Jones is a 62 y.o. female self presents the emergency department with diffuse full body itching that began roughly 1 hour after taking her first lifetime dose of clarithromycin.  She says she has been sick for roughly 1 month and her doctor today diagnosed her with "walking pneumonia".  She filled her prescription at 5 PM then took the dose and 1 hour thereafter became itchy and uncomfortable.  Her daughter then checked her blood pressure noted it was 200 x 2 so the patient came to the emergency department for evaluation.  Denies chest pain or shortness of breath.  Her symptoms began suddenly and have been slowly improving.  Past Medical History:  Diagnosis Date  . Anxiety   . Asthma   . 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 Thad Ranger  . Diabetes mellitus   . GERD (gastroesophageal reflux disease)   . Hyperlipidemia   . Hypertension     Patient Active Problem List   Diagnosis Date Noted  . Left flank pain 05/06/2016  . Vestibular dizziness 01/21/2016  . Concussion with no loss of consciousness 10/09/2015  . Fatigue 09/22/2015  . Tick bite 07/15/2015  . Type 2 diabetes, controlled, with renal manifestation (HCC) 04/26/2015  . Cerebrovascular disease 07/17/2014  . Preventative health care 06/25/2014  . Right sided sciatica 03/08/2013  . MENOPAUSAL SYNDROME 09/10/2008  . Mood disorder (HCC) 09/15/2007  . Essential hypertension, benign 08/10/2006  . Diabetes mellitus type 2, controlled, without complications (HCC) 06/17/2006  . HYPERCHOLESTEROLEMIA 06/17/2006  . GERD 06/17/2006    Past Surgical History:  Procedure Laterality Date  .  CARDIOVASCULAR STRESS TEST  1/15   myoview. EF 60%  . CESAREAN SECTION    . ESOPHAGOGASTRODUODENOSCOPY  05/2005  . TONSILLECTOMY AND ADENOIDECTOMY      Prior to Admission medications   Medication Sig Start Date End Date Taking? Authorizing Provider  amitriptyline (ELAVIL) 10 MG tablet 1 po qhs, increase to 2 po qhs if tolerated Patient taking differently: Take 10 mg by mouth at bedtime as needed for sleep.  10/15/15   Copland, Karleen Hampshire, MD  aspirin EC 81 MG tablet Take 81 mg by mouth daily.    [provider]  atorvastatin (LIPITOR) 20 MG tablet Take 1 tablet (20 mg total) by mouth daily. 02/17/17   Copland, Karleen Hampshire, MD  hydrochlorothiazide (HYDRODIURIL) 25 MG tablet TAKE ONE TABLET BY MOUTH ONCE DAILY 09/03/15   Nahser, Deloris Ping, MD  lisinopril (PRINIVIL,ZESTRIL) 20 MG tablet TAKE 1 TABLET BY MOUTH ONCE DAILY 09/29/16   Tillman Abide I, MD  metoprolol succinate (TOPROL-XL) 100 MG 24 hr tablet TAKE 1 TABLET BY MOUTH  DAILY WITH OR IMMEDIATELY  FOLLOWING A MEAL 01/14/16   Karie Schwalbe, MD  omeprazole (PRILOSEC) 20 MG capsule Take 1 capsule (20 mg total) by mouth daily. 02/17/17   Copland, Karleen Hampshire, MD  OVER THE COUNTER MEDICATION Place 1 drop into both eyes daily as needed (dry eyes). Over the counter lubricating eye drop    [provider]    Allergies Citalopram; Doxycycline hyclate; Erythromycin; Montelukast sodium; Nitrofurantoin; Sulfa antibiotics; Tramadol hcl; Venlafaxine hcl; and Amoxicillin-pot clavulanate  Family History  Problem Relation Age of Onset  .  Coronary artery disease Mother   . Diabetes Mellitus II Mother   . Heart attack Mother 59  . Diabetes Mellitus II Maternal Grandmother     Social History Social History   Tobacco Use  . Smoking status: Never Smoker  . Smokeless tobacco: Never Used  Substance Use Topics  . Alcohol use: No    Alcohol/week: 0.0 oz    Comment: wine occassionally  . Drug use: No    Review of Systems Constitutional: No  fever/chills ENT: No sore throat. Cardiovascular: Denies chest pain. Respiratory: Denies shortness of breath. Gastrointestinal: No abdominal pain.  No nausea, no vomiting.  No diarrhea.  No constipation. Musculoskeletal: Negative for back pain. Neurological: Negative for headaches   ____________________________________________   PHYSICAL EXAM:  VITAL SIGNS: ED Triage Vitals  Enc Vitals Group     BP 02/18/17 0233 (!) 198/103     Pulse Rate 02/18/17 0233 98     Resp 02/18/17 0233 20     Temp 02/18/17 0233 97.8 F (36.6 C)     Temp Source 02/18/17 0233 Oral     SpO2 02/18/17 0233 98 %     Weight 02/18/17 0231 239 lb (108.4 kg)     Height 02/18/17 0231 5\' 4"  (1.626 m)     Head Circumference --      Peak Flow --      Pain Score 02/18/17 0231 7     Pain Loc --      Pain Edu? --      Excl. in GC? --     Constitutional: Alert and oriented x4 well-appearing nontoxic no diaphoresis speaks in full clear sentences Head: Atraumatic. Nose: No congestion/rhinnorhea. Mouth/Throat: No trismus Neck: No stridor.   Cardiovascular: Regular rate and rhythm Respiratory: Normal respiratory effort.  No retractions. Gastrointestinal: Soft nontender Neurologic:  Normal speech and language. No gross focal neurologic deficits are appreciated.  Skin: Faint erythema of her hands    ____________________________________________  LABS (all labs ordered are listed, but only abnormal results are displayed)  Labs Reviewed  BASIC METABOLIC PANEL - Abnormal; Notable for the following components:      Result Value   Glucose, Bld 229 (*)    Creatinine, Ser 1.05 (*)    GFR calc non Af Amer 57 (*)    All other components within normal limits  CBC  TROPONIN I    Lab work reviewed by me with slight hyperglycemia but no evidence of DKA __________________________________________  EKG  ED ECG REPORT I, Merrily Brittle, the attending physician, personally viewed and interpreted this ECG.  Date:  02/18/2017 EKG Time:  Rate: 92 Rhythm: normal sinus rhythm QRS Axis: normal Intervals: normal ST/T Wave abnormalities: normal Narrative Interpretation: no evidence of acute ischemia  ____________________________________________  RADIOLOGY  Chest x-ray reviewed by me with no acute disease ____________________________________________   DIFFERENTIAL includes but not limited to  Allergic reaction, anaphylaxis, endorgan damage, hypertensive emergency,   PROCEDURES  Procedure(s) performed: no  Procedures  Critical Care performed: no  Observation: no ____________________________________________   INITIAL IMPRESSION / ASSESSMENT AND PLAN / ED COURSE  Pertinent labs & imaging results that were available during my care of the patient were reviewed by me and considered in my medical decision making (see chart for details).  Patient arrives very well-appearing although clearly slightly erythematous and itchy.  She likely did have an allergic reaction to clarithromycin although it is clearly not anaphylaxis.  Blood work is reassuring.  At this point she is given Benadryl  with improvement in her symptoms.  I will refer her back to primary care and she understands to stop taking clarithromycin.  She is discharged home in improved condition verbalized understanding agree with plan.      ____________________________________________   FINAL CLINICAL IMPRESSION(S) / ED DIAGNOSES  Final diagnoses:  Allergic reaction, initial encounter      NEW MEDICATIONS STARTED DURING THIS VISIT:  This SmartLink is deprecated. Use AVSMEDLIST instead to display the medication list for a patient.   Note:  This document was prepared using Dragon voice recognition software and may include unintentional dictation errors.      Merrily Brittleifenbark, Etta Gassett, MD 02/18/17 [id number]

## 2017-02-18 NOTE — Discharge Instructions (Signed)
Please stop taking your clarithromycin because I think you are actually allergic to it.  Follow-up with your primary care physician tomorrow for recheck.  Return to the emergency department sooner for any concerns whatsoever.  It was a pleasure to take care of you today, and thank you for coming to our emergency department.  If you have any questions or concerns before leaving please ask the nurse to grab me and I'm more than happy to go through your aftercare instructions again.  If you were prescribed any opioid pain medication today such as Norco, Vicodin, Percocet, morphine, hydrocodone, or oxycodone please make sure you do not drive when you are taking this medication as it can alter your ability to drive safely.  If you have any concerns once you are home that you are not improving or are in fact getting worse before you can make it to your follow-up appointment, please do not hesitate to call 911 and come back for further evaluation.  Merrily BrittleNeil Jaydenn Boccio, MD  Results for orders placed or performed during the hospital encounter of 02/18/17  CBC  Result Value Ref Range   WBC 6.5 3.6 - 11.0 K/uL   RBC 4.32 3.80 - 5.20 MIL/uL   Hemoglobin 13.5 12.0 - 16.0 g/dL   HCT 16.139.7 09.635.0 - 04.547.0 %   MCV 91.8 80.0 - 100.0 fL   MCH 31.3 26.0 - 34.0 pg   MCHC 34.1 32.0 - 36.0 g/dL   RDW 40.912.9 81.111.5 - 91.414.5 %   Platelets 207 150 - 440 K/uL  Basic metabolic panel  Result Value Ref Range   Sodium 140 135 - 145 mmol/L   Potassium 4.3 3.5 - 5.1 mmol/L   Chloride 105 101 - 111 mmol/L   CO2 25 22 - 32 mmol/L   Glucose, Bld 229 (H) 65 - 99 mg/dL   BUN 16 6 - 20 mg/dL   Creatinine, Ser 7.821.05 (H) 0.44 - 1.00 mg/dL   Calcium 9.1 8.9 - 95.610.3 mg/dL   GFR calc non Af Amer 57 (L) >60 mL/min   GFR calc Af Amer >60 >60 mL/min   Anion gap 10 5 - 15  Troponin I  Result Value Ref Range   Troponin I <0.03 <0.03 ng/mL   Dg Chest 2 View  Result Date: 02/18/2017 CLINICAL DATA:  Initial evaluation for acute chest pain.  EXAM: CHEST  2 VIEW COMPARISON:  Prior radiograph from 01/28/2015. FINDINGS: The cardiac and mediastinal silhouettes are stable in size and contour, and remain within normal limits. The lungs are hypoinflated. No airspace consolidation, pleural effusion, or pulmonary edema is identified. There is no pneumothorax. No acute osseous abnormality identified. IMPRESSION: No active cardiopulmonary disease. Electronically Signed   By: Rise MuBenjamin  McClintock M.D.   On: 02/18/2017 03:08

## 2017-02-18 NOTE — ED Triage Notes (Signed)
Pt to triage via w/c with no distress noted; pt reports awoke not feeling well, c/o hypertension, itching and mid CP; began clarithromycin yesterday for pneumonia

## 2017-02-18 NOTE — Telephone Encounter (Signed)
PLEASE NOTE: All timestamps contained within this report are represented as [place] Standard Time. CONFIDENTIALTY NOTICE: This fax transmission is intended only for the addressee. It contains information that is legally privileged, confidential or otherwise protected from use or disclosure. If you are not the intended recipient, you are strictly prohibited from reviewing, disclosing, copying using or disseminating any of this information or taking any action in reliance on or regarding this information. If you have received this fax in error, please notify us immediately by telephone so that we can arrange for its return to [place]. Phone: [phone], Toll-Free: [phone], Fax: [phone] Page: 1 of 2 Call Id: [id number] [facility] Night - Client TELEPHONE ADVICE RECORD [facility] Patient Name: Tami Jones Gender: Female DOB: October 11, 1955 Age: 62 Y 1 M 30 D Return Phone Number: [phone] (Primary) Address: [address] City/State/Zip: [place] [address] Client [facility] Night - Client Client Site [facility] - Night Physician Copland, Karleen Hampshire - MD Contact Type Call Who Is Calling Patient / Member / Family / Caregiver Call Type Triage / Clinical Caller Name Judeth Cornfield Relationship To Patient Daughter Return Phone Number [phone] (Primary) Chief Complaint Heart palpitations or irregular heartbeat Reason for Call Symptomatic / Request for Health Information Initial Comment Her mother has pneumonia and taking Clarithromycin - feeling dizzy, higher heart rate and feeling itchy all over. Translation No Nurse Assessment Nurse: Raymon Mutton, RN, Misty Stanley Date/Time (Eastern Time): 02/18/2017 1:21:35 AM Confirm and document reason for call. If symptomatic, describe symptoms. ---Caller states mother is taking Clarithromycin for pneumonia. She is dizzy, high heart rate and itching all over. Heart rate  88, BP 246/124. Repeat 174/101. Does the patient have any new or worsening symptoms? ---Yes Will a triage be completed? ---Yes Related visit to physician within the last 2 weeks? ---Yes Does the PT have any chronic conditions? (i.e. diabetes, asthma, etc.) ---Yes List chronic conditions. ---HTN Current pneumonia Borderline DM Is this a behavioral health or substance abuse call? ---No Guidelines Guideline Title Affirmed Question Affirmed Notes Nurse Date/Time (Eastern Time) High Blood Pressure [1] Systolic BP >= 160 OR Diastolic >= 100 AND [2] cardiac or neurologic symptoms (e.g., chest pain, difficulty breathing, unsteady gait, blurred vision) Guinn, RN, Misty Stanley 02/18/2017 1:24:57 AM Disp. Time Lamount Cohen Time) Disposition Final User 02/18/2017 1:27:00 AM Go to ED Now Yes Guinn, RN, Misty Stanley PLEASE NOTE: All timestamps contained within this report are represented as [place] Standard Time. CONFIDENTIALTY NOTICE: This fax transmission is intended only for the addressee. It contains information that is legally privileged, confidential or otherwise protected from use or disclosure. If you are not the intended recipient, you are strictly prohibited from reviewing, disclosing, copying using or disseminating any of this information or taking any action in reliance on or regarding this information. If you have received this fax in error, please notify us immediately by telephone so that we can arrange for its return to [place]. Phone: [phone], Toll-Free: [phone], Fax: [phone] Page: 2 of 2 Call Id: [id number] Caller Disagree/Comply Comply Caller Understands Yes PreDisposition Go to ED Care Advice Given Per Guideline GO TO ED NOW: You need to be seen in the Emergency Department. Go to the ER at ___________ Hospital. Leave now. Drive carefully. NOTE TO TRIAGER - DRIVING: * Another adult should drive. * If immediate transportation is not available via car or taxi, then the patient should be  instructed to call EMS-[phone]. CALL EMS 911 IF: * Patient passes  out, starts acting confused or becomes too weak to stand. * You become worse. CARE ADVICE given per High Blood Pressure (Adult) guideline. Referrals [facility] - ED

## 2017-02-18 NOTE — ED Notes (Signed)
ED Provider at bedside. 

## 2017-02-18 NOTE — Telephone Encounter (Signed)
ED dispo would seem reasonable here.

## 2017-02-19 ENCOUNTER — Telehealth: Payer: Self-pay | Admitting: Family Medicine

## 2017-02-19 MED ORDER — MOXIFLOXACIN HCL 400 MG PO TABS: 400.0000 mg | ORAL_TABLET | Freq: Every day | ORAL | 0 refills | Status: DC

## 2017-02-19 NOTE — Telephone Encounter (Signed)
On call note: Needs antibiotics to replace clarithromycin for " walking pneumnia.  See ER visit.   Will change to avelox 400 mg x 5 days  pt notified by [facility].

## 2017-02-21 ENCOUNTER — Telehealth: Payer: Self-pay

## 2017-02-21 NOTE — Telephone Encounter (Signed)
PLEASE NOTE: All timestamps contained within this report are represented as [place] Standard Time. CONFIDENTIALTY NOTICE: This fax transmission is intended only for the addressee. It contains information that is legally privileged, confidential or otherwise protected from use or disclosure. If you are not the intended recipient, you are strictly prohibited from reviewing, disclosing, copying using or disseminating any of this information or taking any action in reliance on or regarding this information. If you have received this fax in error, please notify us immediately by telephone so that we can arrange for its return to [place]. Phone: [phone], Toll-Free: [phone], Fax: [phone] Page: 1 of 2 Call Id: [id number] [facility] Night - Client TELEPHONE ADVICE RECORD [facility] Call Center Patient Name: Tami Jones Gender: Female DOB: 03/07/1955 Age: 62 Y 2 M Return Phone Number: [phone] (Primary) Address: [address] City/State/Zip: [place] [address] Client [facility] Night - Client Client Site [place] [facility] [facility] [place] - Night Physician Copland, Karleen Hampshire - MD Contact Type Call Who Is Calling Patient / Member / Family / Caregiver Call Type Triage / Clinical Caller Name Kendell Bane Relationship To Patient Daughter Return Phone Number [phone] (Primary) Chief Complaint Prescription Refill or Medication Request (non symptomatic) Reason for Call Symptomatic / Request for Health Information Initial Comment Caller states mother was seen on Thursday, was given abx for pneumonia, she had an allergic reaction to meds, went to ER, was supposed to be getting new abx Translation No Nurse Assessment Nurse: Gasper Sells, RN, Marylu Lund Date/Time Lamount Cohen Time): 02/19/2017 12:12:50 PM Confirm and document reason for call. If symptomatic, describe symptoms. ---Caller states mother was seen on Thursday, was given  abx for pneumonia, she had an allergic reaction to meds, went to ER, was supposed to be getting new abx. Got benadryl at the ER. Med d/ c'd. The ER was to send a report from them to the office to notify. Does the patient have any new or worsening symptoms? ---No Please document clinical information provided and list any resource used. ---states she had clarithromycin and allergic rx. States she took a zpack and looking for Dr to Rx something. Citalopram (depression med) Guidelines Guideline Title Affirmed Question Affirmed Notes Nurse Date/Time (Eastern Time) Disp. Time Lamount Cohen Time) Disposition Final User 02/19/2017 12:22:48 PM Paged On Call back to [facility], RN, Marylu Lund 02/19/2017 12:23:09 PM Paged On Call back to [facility], RNMarylu Lund 02/19/2017 1:00:13 PM Call Completed Quentin Cornwall 02/19/2017 12:23:02 PM Clinical Call Yes Gasper Sells, RN, Lavonia Dana NOTE: All timestamps contained within this report are represented as [place] Standard Time. CONFIDENTIALTY NOTICE: This fax transmission is intended only for the addressee. It contains information that is legally privileged, confidential or otherwise protected from use or disclosure. If you are not the intended recipient, you are strictly prohibited from reviewing, disclosing, copying using or disseminating any of this information or taking any action in reliance on or regarding this information. If you have received this fax in error, please notify us immediately by telephone so that we can arrange for its return to [place]. Phone: [phone], Toll-Free: [phone], Fax: [phone] Page: 2 of 2 Call Id: [id number] Paging DoctorName Phone DateTime Result/Outcome Message Type Notes Kerby Nora - MD [phone] 02/19/2017 12:22:48 PM Paged On Call Back to Call Center Doctor Paged please call Janet,RN at call ctr, [phone] Kerby Nora - MD 02/19/2017 12:59:50 PM Spoke with On Call - General Message  Result Dr to [facility] in Rx to  [facility]. No zpack due to allergy to the drug Clarithromycin. Many drug allergies listed on EMR. Dr called it in and daughter called and advised to take once daily for 5 days as very strong. Advised to increase liquids, rest, and honey with hot tea for soothing of cough. UV

## 2017-02-21 NOTE — Telephone Encounter (Signed)
Dr. Ermalene SearingBedsole prescribed Avelox for Tami Jones on 02/19/2017.  Do we still need to change antibiotics??

## 2017-02-21 NOTE — Telephone Encounter (Signed)
Per chart review [facility]18/[facility].

## 2017-02-21 NOTE — Telephone Encounter (Signed)
No - and thank you. Confused by multiple messages.

## 2017-02-21 NOTE — Telephone Encounter (Signed)
clarithrimycin and azithromycin in same class of medication.  Ok to change to Levaquin 500 mg, 1 po daily, #10

## 2017-02-23 ENCOUNTER — Telehealth: Payer: Self-pay | Admitting: *Deleted

## 2017-02-23 MED ORDER — METFORMIN HCL ER 500 MG PO TB24: 500.0000 mg | ORAL_TABLET | Freq: Every day | ORAL | 3 refills | Status: DC

## 2017-02-23 NOTE — Telephone Encounter (Signed)
Metformin Rx sent into [facility] [address] as instructed by Dr. Patsy Lageropland.

## 2017-02-23 NOTE — Telephone Encounter (Signed)
-----   Message from Hannah BeatSpencer Copland, MD sent at 02/23/2017  1:23 PM EST ----- OK  Metformin ER 500 mg, 1 po daily, #30, 3 ref  Keep f/u Dr. Alphonsus SiasLetvak in Feb.

## 2017-03-07 ENCOUNTER — Ambulatory Visit: Payer: Self-pay

## 2017-03-07 NOTE — Telephone Encounter (Signed)
  Reason for Disposition . [1] Taking antibiotic > 24 hours for pneumonia AND [2] new onset of fever  Answer Assessment - Initial Assessment Questions 1. SYMPTOMS: "What symptoms are you most concerned about?" "Is it better, the same, or worse compared to when you saw the doctor?"     Cough - breathing difficulty 2. BREATHING DIFFICULTY: "Are you having any difficulty breathing?" If so, ask "How bad is it?"  (e.g., none, mild, moderate, severe)   - MILD: No SOB at rest, mild SOB with walking, speaks normally in sentences, can lay down, no retractions, pulse < 100.    - MODERATE: SOB at rest, SOB with minimal exertion and prefers to sit, cannot lie down flat, speaks in phrases, mild retractions, audible wheezing, pulse 100-120.    - SEVERE: Very SOB at rest, speaks in single words, struggling to breathe, sitting hunched forward, retractions, pulse > 120      Moderate 3. FEVER: "Do you have a fever?" If so, ask: "What is your temperature, how was it measured, and when did it start?"     No 4. SPUTUM: "Describe the color of your sputum" (clear, white, yellow, green, blood-tinged)     No sputum 5. DIAGNOSIS CONFIRMATION: "When was the pneumonia diagnosed?" "By whom?"     Doctor 6. ANTIBIOTIC: "Are you taking an antibiotic?"  If so, "Which one?" "When was it started?"     Finished 7. OTHER TREATMENT: "Are you receiving any other treatment for the pneumonia?" (e.g., albuterol nebulizer, oxygen) If so, ask, "How often?" and "Do they help?"     No 8. HOSPITAL ADMISSION: "Were you hospitalized for this pneumonia?" If so, ask "When were you discharged home from the hospital?"     No  Protocols used: PNEUMONIA ON ANTIBIOTIC FOLLOW-UP CALL-A-AH Shortness of breath, no appetite. States "I'm not over that pneumonia." Appointment made for tomorrow.Instructed if she feels worse before that, to go to ED. Verbalizes understanding.

## 2017-03-08 ENCOUNTER — Encounter: Payer: Self-pay | Admitting: Internal Medicine

## 2017-03-08 ENCOUNTER — Ambulatory Visit: Payer: Managed Care, Other (non HMO) | Admitting: Internal Medicine

## 2017-03-08 DIAGNOSIS — R0602 Shortness of breath: Secondary | ICD-10-CM | POA: Insufficient documentation

## 2017-03-08 MED ORDER — OMEPRAZOLE 20 MG PO CPDR: 20.0000 mg | DELAYED_RELEASE_CAPSULE | Freq: Every day | ORAL | 3 refills | Status: DC

## 2017-03-08 NOTE — Patient Instructions (Signed)
Please restart omeprazole regularly for acid--it may be causing some of your chest pain and breathing problems.

## 2017-03-08 NOTE — Assessment & Plan Note (Signed)
Relates to "cold just not getting better" Some chest pain but seems more lung than heart related Negative stress test 4 years ago when had multiple ER visits with mood related somatization (which may still be going on to some extent). Recent EKG in ER not really worrisome Not taking the omeprazole--symptoms could be related to reflux---will ask her to restart My concern is for pulmonary process----?consider CT scan, PFTs. Will set up with pulmonary instead to decide on best course

## 2017-03-08 NOTE — Progress Notes (Signed)
Subjective:    Patient ID: Tami Jones, female    DOB: 07/25/1955, 62 y.o.   MRN: [id number]  HPI Here for follow up of "walking pneumonia"  Still having trouble breathing--- "like something sitting on my chest" Pain in back still Antibiotic didn't seem to make any difference Dry cough "tired of feeling sick this long" Never smoker Gets chest pain also--more in afternoon and night No history of childhood asthma  Current Outpatient Medications on File Prior to Visit  Medication Sig Dispense Refill  . amitriptyline (ELAVIL) 10 MG tablet 1 po qhs, increase to 2 po qhs if tolerated (Patient taking differently: Take 10 mg by mouth at bedtime as needed for sleep. ) 60 tablet 2  . aspirin EC 81 MG tablet Take 81 mg by mouth daily.    Marland Kitchen. atorvastatin (LIPITOR) 20 MG tablet Take 1 tablet (20 mg total) by mouth daily. 90 tablet 3  . hydrochlorothiazide (HYDRODIURIL) 25 MG tablet TAKE ONE TABLET BY MOUTH ONCE DAILY 30 tablet 0  . lisinopril (PRINIVIL,ZESTRIL) 20 MG tablet TAKE 1 TABLET BY MOUTH ONCE DAILY 30 tablet 3  . metFORMIN (GLUCOPHAGE-XR) 500 MG 24 hr tablet Take 1 tablet (500 mg total) by mouth daily with breakfast. 30 tablet 3  . metoprolol succinate (TOPROL-XL) 100 MG 24 hr tablet TAKE 1 TABLET BY MOUTH  DAILY WITH OR IMMEDIATELY  FOLLOWING A MEAL 90 tablet 2  . omeprazole (PRILOSEC) 20 MG capsule Take 1 capsule (20 mg total) by mouth daily. 90 capsule 3  . OVER THE COUNTER MEDICATION Place 1 drop into both eyes daily as needed (dry eyes). Over the counter lubricating eye drop     No current facility-administered medications on file prior to visit.     Allergies  Allergen Reactions  . Citalopram Other (See Comments)    Feels odd  . Doxycycline Hyclate Nausea And Vomiting  . Erythromycin Other (See Comments)    Irritated stomach  . Montelukast Sodium Itching  . Nitrofurantoin Nausea And Vomiting  . Sulfa Antibiotics Nausea And Vomiting  . Tramadol Hcl Other (See Comments)      Feels odd  . Venlafaxine Hcl Other (See Comments)    Feels odd  . Amoxicillin-Pot Clavulanate Nausea And Vomiting  . Clarithromycin Rash     See ER note 02/2017    Past Medical History:  Diagnosis Date  . Anxiety   . Asthma   . 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 Thad Rangereynolds  . Diabetes mellitus   . GERD (gastroesophageal reflux disease)   . Hyperlipidemia   . Hypertension     Past Surgical History:  Procedure Laterality Date  . CARDIOVASCULAR STRESS TEST  1/15   myoview. EF 60%  . CESAREAN SECTION    . ESOPHAGOGASTRODUODENOSCOPY  05/2005  . TONSILLECTOMY AND ADENOIDECTOMY      Family History  Problem Relation Age of Onset  . Coronary artery disease Mother   . Diabetes Mellitus II Mother   . Heart attack Mother 2363  . Diabetes Mellitus II Maternal Grandmother     Social History   Socioeconomic History  . Marital status: Divorced    Spouse name: Not on file  . Number of children: 2  . Years of education: Not on file  . Highest education level: Not on file  Social Needs  . Financial resource strain: Not on file  . Food insecurity - worry: Not on file  . Food insecurity - inability: Not on  file  . Transportation needs - medical: Not on file  . Transportation needs - non-medical: Not on file  Occupational History  . Occupation: LAB TECH    Employer: [employer]  Tobacco Use  . Smoking status: Never Smoker  . Smokeless tobacco: Never Used  Substance and Sexual Activity  . Alcohol use: No    Alcohol/week: 0.0 oz    Comment: wine occassionally  . Drug use: No  . Sexual activity: Yes    Birth control/protection: Condom  Other Topics Concern  . Not on file  Social History Narrative  . Not on file   Review of Systems Fever off and on--not consistent Appetite is variable Diarrhea on the metformin---even though taking with food (1-3 loose stools per day) No abdominal pain Lots of indigestion---uses mylanta at times     Objective:   Physical Exam  Constitutional: No distress.  Neck: No thyromegaly present.  Cardiovascular: Normal rate, regular rhythm and normal heart sounds. Exam reveals no gallop.  No murmur heard. Pulmonary/Chest: Effort normal and breath sounds normal. No respiratory distress. She has no wheezes. She has no rales.  Musculoskeletal: She exhibits no edema.  Lymphadenopathy:    She has no cervical adenopathy.          Assessment & Plan:

## 2017-04-08 ENCOUNTER — Encounter: Payer: Self-pay | Admitting: Internal Medicine

## 2017-04-08 ENCOUNTER — Ambulatory Visit (INDEPENDENT_AMBULATORY_CARE_PROVIDER_SITE_OTHER): Payer: Managed Care, Other (non HMO) | Admitting: Internal Medicine

## 2017-04-08 VITALS — BP 132/86 | HR 78 | Temp 98.1°F | Ht 63.0 in | Wt 236.0 lb

## 2017-04-08 DIAGNOSIS — IMO0001 Reserved for inherently not codable concepts without codable children: Secondary | ICD-10-CM

## 2017-04-08 DIAGNOSIS — Z1211 Encounter for screening for malignant neoplasm of colon: Secondary | ICD-10-CM

## 2017-04-08 DIAGNOSIS — F0781 Postconcussional syndrome: Secondary | ICD-10-CM

## 2017-04-08 DIAGNOSIS — Z0000 Encounter for general adult medical examination without abnormal findings: Secondary | ICD-10-CM | POA: Diagnosis not present

## 2017-04-08 DIAGNOSIS — E1165 Type 2 diabetes mellitus with hyperglycemia: Secondary | ICD-10-CM

## 2017-04-08 DIAGNOSIS — R0602 Shortness of breath: Secondary | ICD-10-CM

## 2017-04-08 DIAGNOSIS — F39 Unspecified mood [affective] disorder: Secondary | ICD-10-CM

## 2017-04-08 DIAGNOSIS — I10 Essential (primary) hypertension: Secondary | ICD-10-CM

## 2017-04-08 LAB — HM DIABETES FOOT EXAM

## 2017-04-08 MED ORDER — CITALOPRAM HYDROBROMIDE 20 MG PO TABS: 20.0000 mg | ORAL_TABLET | Freq: Every day | ORAL | 3 refills | Status: DC

## 2017-04-08 MED ORDER — NYSTATIN 100000 UNIT/GM EX POWD: Freq: Two times a day (BID) | CUTANEOUS | 3 refills | Status: DC

## 2017-04-08 MED ORDER — ATORVASTATIN CALCIUM 40 MG PO TABS: 40.0000 mg | ORAL_TABLET | Freq: Every day | ORAL | 3 refills | Status: DC

## 2017-04-08 NOTE — Addendum Note (Signed)
Addended by: Cia Garretson on: 04/08/2017 08:57 AM   Modules accepted: Orders  

## 2017-04-08 NOTE — Addendum Note (Signed)
Addended by: Wendi MayaGREESON, Ramey Schiff on: 04/08/2017 08:57 AM   Modules accepted: Orders

## 2017-04-08 NOTE — Progress Notes (Signed)
   Subjective:    Patient ID: Tami Jones, female    DOB: 08/31/1955, 62 y.o.   MRN: [id number]  HPI Here for physical  Still depressed Was taking citalopram in past--ran out Mood is worse now Not sleeping well either  Has some sore throat "with the weather" Some cough Never got the pulmonary appt--couldn't get off work (she will reschedule) Breathing is better Some hoarseness--back on PPI though  Checking sugars occasionally Usually 120's-140 Highest 179 Tolerating metformin---does give her some diarrhea Review of Systems  Constitutional: Negative for unexpected weight change.       Limiting driving, etc Wears seat belt  HENT: Positive for tinnitus.        Slight hearing loss Needs dental surgery---extractions  Eyes: Positive for visual disturbance.       Needs right cataract  Respiratory: Positive for cough. Negative for chest tightness and shortness of breath.   Cardiovascular: Positive for palpitations. Negative for leg swelling.       Some chest pain--she relates to anxiety  Gastrointestinal: Positive for abdominal pain and diarrhea. Negative for blood in stool.  Endocrine: Negative for polydipsia and polyuria.  Genitourinary: Negative for dysuria and hematuria.       Mild urge incontinence--uses pad at times  Musculoskeletal: Positive for arthralgias and back pain. Negative for joint swelling.  Skin: Negative for rash.       No suspicious lesions  Allergic/Immunologic: Positive for environmental allergies. Negative for immunocompromised state.  Neurological: Positive for dizziness and headaches. Negative for syncope.       Feels she still has some cognitive issues from the head injury last year  Hematological: Negative for adenopathy. Does not bruise/bleed easily.  Psychiatric/Behavioral: Positive for dysphoric mood and sleep disturbance. The patient is nervous/anxious.        Objective:   Physical Exam  Constitutional: She appears well-developed and  well-nourished. No distress.  HENT:  Head: Normocephalic and atraumatic.  Right Ear: External ear normal.  Left Ear: External ear normal.  Mouth/Throat: Oropharynx is clear and moist. No oropharyngeal exudate.  Eyes: Conjunctivae are normal. Pupils are equal, round, and reactive to light.  Neck: No thyromegaly present.  Cardiovascular: Normal rate, normal heart sounds and intact distal pulses. Exam reveals no gallop.  No murmur heard. Pulmonary/Chest: Effort normal and breath sounds normal. No respiratory distress. She has no wheezes. She has no rales.  Abdominal: Soft. There is no tenderness.  Genitourinary:  Genitourinary Comments: Spongy mass right side towards rectum (chronic) Normal introitus Slight cervical metaplasia Pap done  Musculoskeletal: She exhibits no edema.  Lymphadenopathy:    She has no cervical adenopathy.  Neurological:  Slight numbness in right foot (?radicular)  Skin: No erythema.  Intertrigo under left breast  Psychiatric:  Mild depression  Appropriate affect          Assessment & Plan:

## 2017-04-08 NOTE — Assessment & Plan Note (Signed)
Will check labs next time on the metformin

## 2017-04-08 NOTE — Addendum Note (Signed)
Addended by: Wendi MayaGREESON, Michaelia Beilfuss on: 04/08/2017 08:58 AM   Modules accepted: Orders

## 2017-04-08 NOTE — Assessment & Plan Note (Signed)
Will restart the citalopram

## 2017-04-08 NOTE — Assessment & Plan Note (Signed)
BP Readings from Last 3 Encounters:  04/08/17 132/86  03/08/17 122/80  02/18/17 (!) 158/70   Better now

## 2017-04-08 NOTE — Assessment & Plan Note (Signed)
Needs to schedule with the pulmonologist

## 2017-04-08 NOTE — Addendum Note (Signed)
Addended by: Eual FinesBRIDGES, Suan Pyeatt P on: 04/08/2017 08:51 AM   Modules accepted: Orders

## 2017-04-08 NOTE — Patient Instructions (Addendum)
Please follow up and set up the appointment with the pulmonary (lung) specialist. Please set up your screening mammogram.

## 2017-04-08 NOTE — Assessment & Plan Note (Signed)
Way overdue for preventative care Pap today Schedule mammogram FIT Discussed fitness

## 2017-04-08 NOTE — Assessment & Plan Note (Signed)
Still some cognitive issues

## 2017-04-13 LAB — PAP LB (LIQUID-BASED): PAP Smear Comment: 0

## 2017-05-24 ENCOUNTER — Emergency Department: Payer: Managed Care, Other (non HMO)

## 2017-05-24 ENCOUNTER — Ambulatory Visit: Payer: Self-pay | Admitting: *Deleted

## 2017-05-24 ENCOUNTER — Encounter: Payer: Self-pay | Admitting: Emergency Medicine

## 2017-05-24 ENCOUNTER — Other Ambulatory Visit: Payer: Self-pay

## 2017-05-24 ENCOUNTER — Emergency Department
Admission: EM | Admit: 2017-05-24 | Discharge: 2017-05-24 | Disposition: A | Discharge status: Home / Self Care | Payer: Managed Care, Other (non HMO) | Attending: Emergency Medicine | Admitting: Emergency Medicine

## 2017-05-24 DIAGNOSIS — J45909 Unspecified asthma, uncomplicated: Secondary | ICD-10-CM | POA: Insufficient documentation

## 2017-05-24 DIAGNOSIS — R0789 Other chest pain: Secondary | ICD-10-CM | POA: Diagnosis present

## 2017-05-24 DIAGNOSIS — E119 Type 2 diabetes mellitus without complications: Secondary | ICD-10-CM | POA: Insufficient documentation

## 2017-05-24 DIAGNOSIS — Z79899 Other long term (current) drug therapy: Secondary | ICD-10-CM | POA: Diagnosis not present

## 2017-05-24 DIAGNOSIS — Z7982 Long term (current) use of aspirin: Secondary | ICD-10-CM | POA: Diagnosis not present

## 2017-05-24 DIAGNOSIS — R110 Nausea: Secondary | ICD-10-CM | POA: Insufficient documentation

## 2017-05-24 DIAGNOSIS — Z7984 Long term (current) use of oral hypoglycemic drugs: Secondary | ICD-10-CM | POA: Diagnosis not present

## 2017-05-24 DIAGNOSIS — I10 Essential (primary) hypertension: Secondary | ICD-10-CM | POA: Diagnosis not present

## 2017-05-24 LAB — BASIC METABOLIC PANEL
Anion gap: 6 (ref 5–15)
BUN: 15 mg/dL (ref 6–20)
CHLORIDE: 104 mmol/L (ref 101–111)
CO2: 25 mmol/L (ref 22–32)
CREATININE: 0.91 mg/dL (ref 0.44–1.00)
Calcium: 8.5 mg/dL — ABNORMAL LOW (ref 8.9–10.3)
GFR calc Af Amer: >60 mL/min (ref >60)
GFR calc non Af Amer: >60 mL/min (ref >60)
GLUCOSE: 181 mg/dL — AB (ref 65–99)
POTASSIUM: 3.7 mmol/L (ref 3.5–5.1)
SODIUM: 135 mmol/L (ref 135–145)

## 2017-05-24 LAB — CBC
HEMATOCRIT: 40 % (ref 35.0–47.0)
Hemoglobin: 13.5 g/dL (ref 12.0–16.0)
MCH: 30.8 pg (ref 26.0–34.0)
MCHC: 33.8 g/dL (ref 32.0–36.0)
MCV: 91 fL (ref 80.0–100.0)
PLATELETS: 207 10*3/uL (ref 150–440)
RBC: 4.4 MIL/uL (ref 3.80–5.20)
RDW: 13.2 % (ref 11.5–14.5)
WBC: 5.2 10*3/uL (ref 3.6–11.0)

## 2017-05-24 LAB — TROPONIN I
Troponin I: <0.03 ng/mL (ref <0.03)
Troponin I: <0.03 ng/mL (ref <0.03)

## 2017-05-24 MED ORDER — METOCLOPRAMIDE HCL 10 MG PO TABS
10.0000 mg | ORAL_TABLET | Freq: Once | ORAL | Status: AC
  Administered 2017-05-24: 10 mg via ORAL
  Filled 2017-05-24: qty 1

## 2017-05-24 MED ORDER — FAMOTIDINE 20 MG PO TABS
20.0000 mg | ORAL_TABLET | Freq: Once | ORAL | Status: AC
  Administered 2017-05-24: 20 mg via ORAL
  Filled 2017-05-24: qty 1

## 2017-05-24 NOTE — Telephone Encounter (Signed)
Pt reports chest "heaviness"  6/10, constant. Upper left chest "at breast area." Does not radiate. Onset 1 hour ago. "Feels like a weight on my chest." Reports nausea since this AM; no emesis.States intermittent lightheadedness, mild SOB.  States pain worsens with inspiration.  Pt states "Just don't feel right." H/O CVA, HTN. Left work due to weakness, fatigue, decreased endurance. Directed pt to ED. Family will drive.   Reason for Disposition . Dizziness or lightheadedness  Answer Assessment - Initial Assessment Questions 1. LOCATION: "Where does it hurt?"       Left side of chest, upper area at breast 2. RADIATION: "Does the pain go anywhere else?" (e.g., into neck, jaw, arms, back)     no 3. ONSET: "When did the chest pain begin?" (Minutes, hours or days)      1 hour ago 4. PATTERN "Does the pain come and go, or has it been constant since it started?"  "Does it get worse with exertion?"      Constant, 5. DURATION: "How long does it last" (e.g., seconds, minutes, hours)    constant 6. SEVERITY: "How bad is the pain?"  (e.g., Scale 1-10; mild, moderate, or severe)    - MILD (1-3): doesn't interfere with normal activities     - MODERATE (4-7): interferes with normal activities or awakens from sleep    - SEVERE (8-10): excruciating pain, unable to do any normal activities       6/10 7. CARDIAC RISK FACTORS: "Do you have any history of heart problems or risk factors for heart disease?" (e.g., prior heart attack, angina; high blood pressure, diabetes, being overweight, high cholesterol, smoking, or strong family history of heart disease)     no 8. PULMONARY RISK FACTORS: "Do you have any history of lung disease?"  (e.g., blood clots in lung, asthma, emphysema, birth control pills)     no 9. CAUSE: "What do you think is causing the chest pain?"     unsure 10. OTHER SYMPTOMS: "Do you have any other symptoms?" (e.g., dizziness, nausea, vomiting, sweating, fever, difficulty breathing, cough)    Lightheadedness, comes and goes, mild SOB, weakness, fatigue, decreased endurance. 11. PREGNANCY: "Is there any chance you are pregnant?" "When was your last menstrual period?"       no  Protocols used: CHEST PAIN-A-AH

## 2017-05-24 NOTE — Discharge Instructions (Signed)
Take your blood pressure medications  °

## 2017-05-24 NOTE — ED Provider Notes (Signed)
[facility] Emergency Department Provider Note ____________________________________________   First MD Initiated Contact with Patient 05/24/17 1754     (approximate)  I have reviewed the triage vital signs and the nursing notes.   HISTORY  Chief Complaint Chest Pain    HPI Tami Jones is a 62 y.o. female with past medical history as noted below who presents with chest discomfort, described as pressure-like, acute onset when she woke this morning, persistent throughout the day, and associated with nausea but no vomiting.  Patient denied shortness of breath to me.  She stated that she felt like she might need to have a loose bowel movement but then did not.  She denies abdominal pain.  She states she is on Prevacid for GERD but did not take it today.  She denies any unusual foods in the last few days or sick contacts.  She states she also feels generally unwell today, but cannot further characterize it.   Past Medical History:  Diagnosis Date  . Anxiety   . Asthma   . 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 Thad Ranger  . Diabetes mellitus   . GERD (gastroesophageal reflux disease)   . Hyperlipidemia   . Hypertension     Patient Active Problem List   Diagnosis Date Noted  . SOB (shortness of breath) 03/08/2017  . Vestibular dizziness 01/21/2016  . 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 Tick bite 07/15/2015  . Cerebrovascular disease 07/17/2014  . Preventative health care 06/25/2014  . Right sided sciatica 03/08/2013  . MENOPAUSAL SYNDROME 09/10/2008  . Mood disorder (HCC) 09/15/2007  . Essential hypertension, benign 08/10/2006  . Diabetes mellitus type 2, uncontrolled, without complications (HCC) 06/17/2006  . HYPERCHOLESTEROLEMIA 06/17/2006  . GERD 06/17/2006    Past Surgical History:  Procedure Laterality Date  . CARDIOVASCULAR STRESS TEST  1/15   myoview. EF 60%  . CESAREAN SECTION    .  ESOPHAGOGASTRODUODENOSCOPY  05/2005  . TONSILLECTOMY AND ADENOIDECTOMY      Prior to Admission medications   Medication Sig Start Date End Date Taking? Authorizing Provider  amitriptyline (ELAVIL) 10 MG tablet 1 po qhs, increase to 2 po qhs if tolerated Patient not taking: Reported on 04/08/2017 10/15/15   Hannah Beat, MD  aspirin EC 81 MG tablet Take 81 mg by mouth daily.    [provider]  atorvastatin (LIPITOR) 40 MG tablet Take 1 tablet (40 mg total) by mouth daily. 04/08/17   Karie Schwalbe, MD  citalopram (CELEXA) 20 MG tablet Take 1 tablet (20 mg total) by mouth daily. 04/08/17   Karie Schwalbe, MD  hydrochlorothiazide (HYDRODIURIL) 25 MG tablet TAKE ONE TABLET BY MOUTH ONCE DAILY 09/03/15   Nahser, Deloris Ping, MD  lisinopril (PRINIVIL,ZESTRIL) 20 MG tablet TAKE 1 TABLET BY MOUTH ONCE DAILY 09/29/16   Karie Schwalbe, MD  metFORMIN (GLUCOPHAGE-XR) 500 MG 24 hr tablet Take 1 tablet (500 mg total) by mouth daily with breakfast. 02/23/17   Copland, Karleen Hampshire, MD  metoprolol succinate (TOPROL-XL) 100 MG 24 hr tablet TAKE 1 TABLET BY MOUTH  DAILY WITH OR IMMEDIATELY  FOLLOWING A MEAL 01/14/16   Tillman Abide I, MD  nystatin (MYCOSTATIN/NYSTOP) powder Apply topically 2 (two) times daily. 04/08/17   Karie Schwalbe, MD  omeprazole (PRILOSEC) 20 MG capsule Take 1 capsule (20 mg total) by mouth daily. 03/08/17   Karie Schwalbe, MD  OVER THE COUNTER MEDICATION Place 1 drop into both eyes daily as  needed (dry eyes). Over the counter lubricating eye drop    [provider]    Allergies Citalopram; Doxycycline hyclate; Erythromycin; Montelukast sodium; Nitrofurantoin; Sulfa antibiotics; Tramadol hcl; Venlafaxine hcl; Amoxicillin-pot clavulanate; and Clarithromycin  Family History  Problem Relation Age of Onset  . Coronary artery disease Mother   . Diabetes Mellitus II Mother   . Heart attack Mother 25  . Diabetes Mellitus II Maternal Grandmother     Social  History Social History   Tobacco Use  . Smoking status: Never Smoker  . Smokeless tobacco: Never Used  Substance Use Topics  . Alcohol use: No    Alcohol/week: 0.0 oz    Comment: wine occassionally  . Drug use: No    Review of Systems  Constitutional: No fever/chills.  Positive for malaise. Eyes: No visual changes. ENT: No sore throat. Cardiovascular: Positive for chest discomfort. Respiratory: Denies shortness of breath. Gastrointestinal: Positive for nausea, no vomiting or diarrhea.  Genitourinary: Negative for dysuria.  Musculoskeletal: Negative for back pain. Skin: Negative for rash. Neurological: Negative for headache.   ____________________________________________   PHYSICAL EXAM:  VITAL SIGNS: ED Triage Vitals  Enc Vitals Group     BP 05/24/17 1239 (!) 185/80     Pulse Rate 05/24/17 1239 64     Resp 05/24/17 1239 16     Temp 05/24/17 1239 99 F (37.2 C)     Temp Source 05/24/17 1239 Oral     SpO2 05/24/17 1239 99 %     Weight 05/24/17 1240 235 lb (106.6 kg)     Height 05/24/17 1240 5\' 3"  (1.6 m)     Head Circumference --      Peak Flow --      Pain Score 05/24/17 1240 5     Pain Loc --      Pain Edu? --      Excl. in GC? --     Constitutional: Alert and oriented. Well appearing and in no acute distress. Eyes: Conjunctivae are normal.  Head: Atraumatic. Nose: No congestion/rhinnorhea. Mouth/Throat: Mucous membranes are moist.   Neck: Normal range of motion.  Cardiovascular: Normal rate, regular rhythm. Grossly normal heart sounds.  Good peripheral circulation.  Chest wall nontender. Respiratory: Normal respiratory effort.  No retractions. Lungs CTAB. Gastrointestinal: Soft and nontender. No distention.  Genitourinary: No flank tenderness. Musculoskeletal: No lower extremity edema.  Extremities warm and well perfused.  Neurologic:  Normal speech and language. No gross focal neurologic deficits are appreciated.  Skin:  Skin is warm and dry. No rash  noted. Psychiatric: Mood and affect are normal. Speech and behavior are normal.  ____________________________________________   LABS (all labs ordered are listed, but only abnormal results are displayed)  Labs Reviewed  BASIC METABOLIC PANEL - Abnormal; Notable for the following components:      Result Value   Glucose, Bld 181 (*)    Calcium 8.5 (*)    All other components within normal limits  CBC  TROPONIN I  TROPONIN I   ____________________________________________  EKG  ED ECG REPORT I, Dionne Bucy, the attending physician, personally viewed and interpreted this ECG.  Date: 05/24/2017 EKG Time: 1231 Rate: 61 Rhythm: normal sinus rhythm QRS Axis: normal Intervals: normal ST/T Wave abnormalities: LVH Narrative Interpretation: no evidence of acute ischemia  ____________________________________________  RADIOLOGY  CXR: Mild cardiac enlargement when compared to prior studies, with no other acute findings  ____________________________________________   PROCEDURES  Procedure(s) performed: No  Procedures  Critical Care performed: No ____________________________________________  INITIAL IMPRESSION / ASSESSMENT AND PLAN / ED COURSE  Pertinent labs & imaging results that were available during my care of the patient were reviewed by me and considered in my medical decision making (see chart for details).  62 year old female with PMH as noted above presents with atypical and nonexertional chest discomfort since she woke this morning, associated with nausea and feeling generally unwell but no other acute symptoms.  No prior history of this symptom.  On exam, the patient is relatively well-appearing.  She had hypertension at triage, but the rest of the vitals were normal.  The remainder of the exam is as described above.  EKG shows LVH but is nonischemic.  Chest x-ray shows mild cardiac enlargement but no other acute findings.  Overall I have low suspicion  for ACS given the atypical nature of the symptoms, the initial negative troponin, and the patient's relatively well appearance.  Will obtain a repeat troponin to fully rule out.  Differential also includes GI cause such as GERD or gastritis, or less likely MSK cause.  There is no clinical evidence for PE and given patient's well appearance and x-ray findings, no suspicion for aortic dissection or other acute vascular cause.  We will give Reglan and Pepcid here, and await second troponin.  Anticipate discharge home if it is negative.    ----------------------------------------- 7:46 PM on 05/24/2017 -----------------------------------------  Repeat troponin is negative.  Patient reports improved symptoms after Pepcid and Reglan.  She feels well to go home.  Patient's blood pressure is still elevated, however she is due for her evening doses of metoprolol and lisinopril.  Given the reassuring labs, and no symptoms to suggest end-organ dysfunction, there is no evidence of hypertensive urgency or indication for acute treatment at this time.  She is stable for discharge home.  Return precautions given, and she expresses understanding.  ____________________________________________   FINAL CLINICAL IMPRESSION(S) / ED DIAGNOSES  Final diagnoses:  Atypical chest pain  Nausea      NEW MEDICATIONS STARTED DURING THIS VISIT:  New Prescriptions   No medications on file     Note:  This document was prepared using Dragon voice recognition software and may include unintentional dictation errors.    Dionne BucySiadecki, Ameirah Khatoon, MD 05/24/17 [id number]

## 2017-05-24 NOTE — Telephone Encounter (Signed)
Nothing new for her---but just can't tell over the phone Will follow up based on the ER evaluation

## 2017-05-24 NOTE — ED Triage Notes (Signed)
Pt to ED from home c/o left chest pain, non radiating when she woke up today dull pressure like, exertional SOB, nausea, denies vomiting or diarrhea.  Hx HTN and DBM and hasn't taken morning meds d/t nausea.

## 2017-05-30 ENCOUNTER — Telehealth: Payer: Self-pay

## 2017-05-30 NOTE — Telephone Encounter (Signed)
Tried to call pt to see how she is doing after recent ER visit for Chest Pain but VM is full.  PEC, if pt calls back, please ask how she is doing and send back to me. Thanks.

## 2017-05-31 ENCOUNTER — Ambulatory Visit: Payer: Self-pay

## 2017-05-31 NOTE — Telephone Encounter (Signed)
Phone call from pt.  Reported worsening watery diarrhea over past 2-3 weeks.  Stated "I can't take this anymore."  Reported she had 5 stools yesterday; 2 were involuntarily, during the night.  Had 3 stools today.  C/o having abdominal cramping prior to the diarrhea.  Stated she also breaks out in cold sweat.  C/o increased pain and irritation of her hemorrhoids, with increase in stool frequency.  Noted light amt. of blood on toilet tissue when she wipes. Trying to drink to rehydrate.  Noted some increased thirst/ dry mouth.  Stated "I just feel tired."  Reported a little less urine output, but hard to tell with the watery diarrhea she has.  Stated she knows her watery diarrhea is related to Metformin.  Denied vomiting or fever/ chills. C/o "some upset stomach at times."   Appt. Scheduled with PCP in AM.  Agrees with plan.  Care advice given. Verb. Understanding.        Reason for Disposition . [1] MODERATE diarrhea (e.g., 4-6 times / day more than normal) AND [2] present > 48 hours (2 days)  Answer Assessment - Initial Assessment Questions 1. DIARRHEA SEVERITY: "How bad is the diarrhea?" "How many extra stools have you had in the past 24 hours than normal?"    - MILD: Few loose or mushy BMs; increase of 1-3 stools over normal daily number of stools; mild increase in ostomy output.   - MODERATE: Increase of 4-6 stools daily over normal; moderate increase in ostomy output.   - SEVERE (or Worst Possible): Increase of 7 or more stools daily over normal; moderate increase in ostomy output; incontinence.     Moderate - had 5 stools, (2 stools were involuntary and occurred during sleep)  had 3 stools today  2. ONSET: "When did the diarrhea begin?"      "For awhile"  It has worsened over the last 2-3 weeks 3. BM CONSISTENCY: "How loose or watery is the diarrhea?"      Watery brown stool  4. VOMITING: "Are you also vomiting?" If so, ask: "How many times in the past 24 hours?"      Denied vomiting  5.  ABDOMINAL PAIN: "Are you having any abdominal pain?" If yes: "What does it feel like?" (e.g., crampy, dull, intermittent, constant)      Cramping in lower abdomen with diarrhea stool  6. ABDOMINAL PAIN SEVERITY: If present, ask: "How bad is the pain?"  (e.g., Scale 1-10; mild, moderate, or severe)    - MILD (1-3): doesn't interfere with normal activities, abdomen soft and not tender to touch     - MODERATE (4-7): interferes with normal activities or awakens from sleep, tender to touch     - SEVERE (8-10): excruciating pain, doubled over, unable to do any normal activities       moderate 7. ORAL INTAKE: If vomiting, "Have you been able to drink liquids?" "How much fluids have you had in the past 24 hours?"     Drinking fluids to try to rehydrate 8. HYDRATION: "Any signs of dehydration?" (e.g., dry mouth [not just dry lips], too weak to stand, dizziness, new weight loss) "When did you last urinate?"     More thirsty; mouth dry; urinating a little bit less; more tired 9. EXPOSURE: "Have you traveled to a foreign country recently?" "Have you been exposed to anyone with diarrhea?" "Could you have eaten any food that was spoiled?"     No  10. OTHER SYMPTOMS: "Do you have any other  symptoms?" (e.g., fever, blood in stool)       Hemorrhoids are sore; light amt. of bleeding on toilet tissue; cold sweats with diarrhea stool  11. PREGNANCY: "Is there any chance you are pregnant?" "When was your last menstrual period?"       No  Protocols used: DIARRHEA-A-AH

## 2017-06-01 ENCOUNTER — Ambulatory Visit: Payer: Self-pay | Admitting: Internal Medicine

## 2017-06-01 NOTE — Telephone Encounter (Signed)
Okay Will review at OV May need to change diabetic Rx

## 2017-06-17 ENCOUNTER — Ambulatory Visit: Payer: Managed Care, Other (non HMO) | Admitting: Internal Medicine

## 2017-07-06 ENCOUNTER — Ambulatory Visit: Payer: Managed Care, Other (non HMO) | Admitting: Internal Medicine

## 2017-07-06 DIAGNOSIS — Z0289 Encounter for other administrative examinations: Secondary | ICD-10-CM

## 2017-07-06 NOTE — Progress Notes (Deleted)
   Subjective:    Patient ID: Tami Jones, female    DOB: September 17, 1955, 62 y.o.   MRN: [id number]  HPI    Review of Systems     Objective:   Physical Exam         Assessment & Plan:

## 2017-07-10 ENCOUNTER — Other Ambulatory Visit: Payer: Self-pay | Admitting: Internal Medicine

## 2017-07-16 ENCOUNTER — Other Ambulatory Visit: Payer: Self-pay | Admitting: Internal Medicine

## 2017-07-27 ENCOUNTER — Encounter: Payer: Self-pay | Admitting: Internal Medicine

## 2017-07-27 ENCOUNTER — Ambulatory Visit: Payer: Managed Care, Other (non HMO) | Admitting: Internal Medicine

## 2017-07-27 VITALS — BP 140/78 | HR 77 | Temp 98.2°F | Resp 18 | Ht 63.0 in | Wt 236.0 lb

## 2017-07-27 DIAGNOSIS — E1165 Type 2 diabetes mellitus with hyperglycemia: Secondary | ICD-10-CM | POA: Diagnosis not present

## 2017-07-27 DIAGNOSIS — J0110 Acute frontal sinusitis, unspecified: Secondary | ICD-10-CM

## 2017-07-27 DIAGNOSIS — IMO0001 Reserved for inherently not codable concepts without codable children: Secondary | ICD-10-CM

## 2017-07-27 MED ORDER — GLIPIZIDE ER 5 MG PO TB24: 5.0000 mg | ORAL_TABLET | Freq: Every day | ORAL | 3 refills | Status: DC

## 2017-07-27 MED ORDER — AMOXICILLIN 500 MG PO TABS: 1000.0000 mg | ORAL_TABLET | Freq: Two times a day (BID) | ORAL | 0 refills | Status: DC

## 2017-07-27 NOTE — Assessment & Plan Note (Signed)
Off metformin due to diarrhea Will start glipizide

## 2017-07-27 NOTE — Assessment & Plan Note (Signed)
Seems to have clear secondary bacterial infection Will Rx with amoxil for 1 week (just intolerant to augmentin--not allergic)

## 2017-07-27 NOTE — Progress Notes (Signed)
Subjective:    Patient ID: Tami Jones, female    DOB: 05/17/1955, 62 y.o.   MRN: [id number]  HPI Here due to cough Had 2 weeks of symptoms---felt it was bronchial Did better briefly and then relapsed  Head pain, ears are sore Cough is worst at night when lying down Has post nasal drip Still heavy in chest Some SOB and wheezing (mild) Head pain is frontal and occipital Both ears are painful Some sore throat  Tylenol only---not really helping Tried coricidin a couple of weeks ago---also no help  Current Outpatient Medications on File Prior to Visit  Medication Sig Dispense Refill  . aspirin EC 81 MG tablet Take 81 mg by mouth daily.    Marland Kitchen. atorvastatin (LIPITOR) 40 MG tablet Take 1 tablet (40 mg total) by mouth daily. 90 tablet 3  . citalopram (CELEXA) 20 MG tablet Take 1 tablet (20 mg total) by mouth daily. 90 tablet 3  . hydrochlorothiazide (HYDRODIURIL) 25 MG tablet TAKE ONE TABLET BY MOUTH ONCE DAILY 30 tablet 0  . lisinopril (PRINIVIL,ZESTRIL) 20 MG tablet TAKE 1 TABLET BY MOUTH DAILY 30 tablet 10  . metoprolol succinate (TOPROL-XL) 100 MG 24 hr tablet TAKE 1 TABLET BY MOUTH WITH FOOD 30 tablet 10  . nystatin (MYCOSTATIN/NYSTOP) powder Apply topically 2 (two) times daily. 60 g 3  . omeprazole (PRILOSEC) 20 MG capsule Take 1 capsule (20 mg total) by mouth daily. 90 capsule 3  . OVER THE COUNTER MEDICATION Place 1 drop into both eyes daily as needed (dry eyes). Over the counter lubricating eye drop     No current facility-administered medications on file prior to visit.     Allergies  Allergen Reactions  . Citalopram Other (See Comments)    Feels odd  . Doxycycline Hyclate Nausea And Vomiting  . Erythromycin Other (See Comments)    Irritated stomach  . Montelukast Sodium Itching  . Nitrofurantoin Nausea And Vomiting  . Sulfa Antibiotics Nausea And Vomiting  . Tramadol Hcl Other (See Comments)    Feels odd  . Venlafaxine Hcl Other (See Comments)    Feels odd  .  Amoxicillin-Pot Clavulanate Nausea And Vomiting  . Clarithromycin Rash     See ER note 02/2017    Past Medical History:  Diagnosis Date  . Anxiety   . Asthma   . 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 Thad Rangereynolds  . Diabetes mellitus   . GERD (gastroesophageal reflux disease)   . Hyperlipidemia   . Hypertension     Past Surgical History:  Procedure Laterality Date  . CARDIOVASCULAR STRESS TEST  1/15   myoview. EF 60%  . CESAREAN SECTION    . ESOPHAGOGASTRODUODENOSCOPY  05/2005  . TONSILLECTOMY AND ADENOIDECTOMY      Family History  Problem Relation Age of Onset  . Coronary artery disease Mother   . Diabetes Mellitus II Mother   . Heart attack Mother [id number]  . Diabetes Mellitus II Maternal Grandmother     Social History   Socioeconomic History  . Marital status: Divorced    Spouse name: Not on file  . Number of children: 2  . Years of education: Not on file  . Highest education level: Not on file  Occupational History  . Occupation: LAB TECH    Employer: [employer]  Social Needs  . Financial resource strain: Not on file  . Food insecurity:    Worry: Not on file    Inability: Not on file  .  Transportation needs:    Medical: Not on file    Non-medical: Not on file  Tobacco Use  . Smoking status: Never Smoker  . Smokeless tobacco: Never Used  Substance and Sexual Activity  . Alcohol use: No    Alcohol/week: 0.0 oz    Comment: wine occassionally  . Drug use: No  . Sexual activity: Yes    Birth control/protection: Condom  Lifestyle  . Physical activity:    Days per week: Not on file    Minutes per session: Not on file  . Stress: Not on file  Relationships  . Social connections:    Talks on phone: Not on file    Gets together: Not on file    Attends religious service: Not on file    Active member of club or organization: Not on file    Attends meetings of clubs or organizations: Not on file    Relationship status: Not on file  .  Intimate partner violence:    Fear of current or ex partner: Not on file    Emotionally abused: Not on file    Physically abused: Not on file    Forced sexual activity: Not on file  Other Topics Concern  . Not on file  Social History Narrative  . Not on file   Review of Systems No rash No vomiting or diarrhea---but throws up mucus Eating okay Depression worse lately--only a short time (she will let me know if it persists) Not taking metformin---diarrhea too bad    Objective:   Physical Exam  Constitutional: She appears well-developed. No distress.  HENT:  No sig sinus tenderness Moderate nasal inflammation TMs normal Slight pharyngeal injection           Assessment & Plan:

## 2017-08-03 ENCOUNTER — Ambulatory Visit: Payer: Managed Care, Other (non HMO) | Admitting: Family Medicine

## 2017-08-03 ENCOUNTER — Encounter: Payer: Self-pay | Admitting: Family Medicine

## 2017-08-03 ENCOUNTER — Ambulatory Visit: Payer: Self-pay | Admitting: *Deleted

## 2017-08-03 ENCOUNTER — Telehealth: Payer: Self-pay | Admitting: Family Medicine

## 2017-08-03 VITALS — BP 140/84 | HR 87 | Temp 98.3°F | Ht 63.0 in | Wt 237.2 lb

## 2017-08-03 DIAGNOSIS — R238 Other skin changes: Secondary | ICD-10-CM

## 2017-08-03 DIAGNOSIS — E1165 Type 2 diabetes mellitus with hyperglycemia: Secondary | ICD-10-CM | POA: Diagnosis not present

## 2017-08-03 DIAGNOSIS — N644 Mastodynia: Secondary | ICD-10-CM | POA: Diagnosis not present

## 2017-08-03 DIAGNOSIS — IMO0001 Reserved for inherently not codable concepts without codable children: Secondary | ICD-10-CM

## 2017-08-03 MED ORDER — PREDNISONE 20 MG PO TABS: 20.0000 mg | ORAL_TABLET | Freq: Every day | ORAL | 0 refills | Status: DC

## 2017-08-03 MED ORDER — CEPHALEXIN 500 MG PO CAPS: 1000.0000 mg | ORAL_CAPSULE | Freq: Two times a day (BID) | ORAL | 0 refills | Status: DC

## 2017-08-03 NOTE — Telephone Encounter (Signed)
[facility].  [facility] asking [facility].

## 2017-08-03 NOTE — Telephone Encounter (Signed)
Copied from CRM [phone]. Topic: Quick Communication - See Telephone Encounter >> Aug 03, 2017  1:40 PM Terisa Starraylor, Brittany L wrote: CRM for notification. See Telephone encounter for: 08/03/17.   Patient said she was just in the office with Dr Patsy Lageropland, she meant to let him know she uses another pharmacy now. She would like the predniSONE (DELTASONE) 20 MG tablet & cephALEXin (KEFLEX) 500 MG capsule [facility] [address] Please advise.

## 2017-08-03 NOTE — Telephone Encounter (Signed)
Pt reports pain at right breast area, onset yesterday. Radiating to right side to axilla. 6/10 this am, worse yesterday. This am noted one fluid filled blister on breast with "Another red looking area under blister." Denies fever. Same day appt made for today with Dr. Patsy Lageropland. Care advise given per protocol.  Reason for Disposition . [1] Multiple small blisters grouped together in one area of body (i.e., dermatomal distribution or "band" or "stripe") AND [2] painful  Answer Assessment - Initial Assessment Questions 1. APPEARANCE of SORES: "What do the sores look like?"     Blister, fluid filled 2. NUMBER: "How many sores are there?"     one 3. SIZE: "How big is the largest sore?"     Little less than a dime 4. LOCATION: "Where are the sores located?"    Right breast, nipple area 5. ONSET: "When did the sores begin?"     This am 6. CAUSE: "What do you think is causing the sores?"     unsure 7. OTHER SYMPTOMS: "Do you have any other symptoms?" (e.g., fever, new weakness)     Pain, radiates around side, under arm. 6/10 presently.  Protocols used: [facility]

## 2017-08-03 NOTE — Telephone Encounter (Signed)
Unable to reach pt by phone to have rx [facility] [address]

## 2017-08-03 NOTE — Progress Notes (Signed)
Dr. Karleen HampshireSpencer T. Laveyah Oriol, MD, CAQ Sports Medicine Primary Care and Sports Medicine [address] [place], [address] Phone: [phone] Fax: [phone]  08/03/2017  Patient: Tami ArbourCynthia B Jones, MRN: [id number], DOB: 12/12/1955, 62 y.o.  Primary Physician:  Karie SchwalbeLetvak, Richard I, MD   Chief Complaint  Patient presents with  . Blisters on Breast   Subjective:   [name] is a 62 y.o. very pleasant female patient who presents with the following:  Blister on R breast  Lab Results  Component Value Date   HGBA1C 8.5 (H) 02/17/2017    Pleasant lady who I remember well who has type 2 diabetes who presents with new onset painful bulla on the right breast.  She has never had anything similar to this in the past.  No exposure, insect bite.  She also has pain surrounding this region.  There has been minimal liquidy discharge also.  Past Medical History, Surgical History, Social History, Family History, Problem List, Medications, and Allergies have been reviewed and updated if relevant.  Patient Active Problem List   Diagnosis Date Noted  . Acute non-recurrent frontal sinusitis 07/27/2017  . SOB (shortness of breath) 03/08/2017  . Vestibular dizziness 01/21/2016  . 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 Tick bite 07/15/2015  . Cerebrovascular disease 07/17/2014  . Preventative health care 06/25/2014  . Right sided sciatica 03/08/2013  . MENOPAUSAL SYNDROME 09/10/2008  . Mood disorder (HCC) 09/15/2007  . Essential hypertension, benign 08/10/2006  . Diabetes mellitus type 2, uncontrolled, without complications (HCC) 06/17/2006  . HYPERCHOLESTEROLEMIA 06/17/2006  . GERD 06/17/2006    Past Medical History:  Diagnosis Date  . Anxiety   . Asthma   . 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 Thad Rangereynolds  . Diabetes mellitus   . GERD (gastroesophageal reflux disease)   . Hyperlipidemia   . Hypertension     Past Surgical History:  Procedure  Laterality Date  . CARDIOVASCULAR STRESS TEST  1/15   myoview. EF 60%  . CESAREAN SECTION    . ESOPHAGOGASTRODUODENOSCOPY  05/2005  . TONSILLECTOMY AND ADENOIDECTOMY      Social History   Socioeconomic History  . Marital status: Divorced    Spouse name: Not on file  . Number of children: 2  . Years of education: Not on file  . Highest education level: Not on file  Occupational History  . Occupation: LAB TECH    Employer: [employer]  Social Needs  . Financial resource strain: Not on file  . Food insecurity:    Worry: Not on file    Inability: Not on file  . Transportation needs:    Medical: Not on file    Non-medical: Not on file  Tobacco Use  . Smoking status: Never Smoker  . Smokeless tobacco: Never Used  Substance and Sexual Activity  . Alcohol use: No    Alcohol/week: 0.0 oz    Comment: wine occassionally  . Drug use: No  . Sexual activity: Yes    Birth control/protection: Condom  Lifestyle  . Physical activity:    Days per week: Not on file    Minutes per session: Not on file  . Stress: Not on file  Relationships  . Social connections:    Talks on phone: Not on file    Gets together: Not on file    Attends religious service: Not on file    Active member of club or organization: Not on file    Attends meetings  of clubs or organizations: Not on file    Relationship status: Not on file  . Intimate partner violence:    Fear of current or ex partner: Not on file    Emotionally abused: Not on file    Physically abused: Not on file    Forced sexual activity: Not on file  Other Topics Concern  . Not on file  Social History Narrative  . Not on file    Family History  Problem Relation Age of Onset  . Coronary artery disease Mother   . Diabetes Mellitus II Mother   . Heart attack Mother 41  . Diabetes Mellitus II Maternal Grandmother     Allergies  Allergen Reactions  . Citalopram Other (See Comments)    Feels odd  . Doxycycline Hyclate Nausea And  Vomiting  . Erythromycin Other (See Comments)    Irritated stomach  . Montelukast Sodium Itching  . Nitrofurantoin Nausea And Vomiting  . Sulfa Antibiotics Nausea And Vomiting  . Tramadol Hcl Other (See Comments)    Feels odd  . Venlafaxine Hcl Other (See Comments)    Feels odd  . Amoxicillin-Pot Clavulanate Nausea And Vomiting  . Clarithromycin Rash     See ER note 02/2017    Medication list reviewed and updated in full in [facility].   GEN: No acute illnesses, no fevers, chills. GI: No n/v/d, eating normally Pulm: No SOB Interactive and getting along well at home.  Otherwise, ROS is as per the HPI.  Objective:   BP 140/84   Pulse 87   Temp 98.3 F (36.8 C) (Oral)   Ht 5\' 3"  (1.6 m)   Wt 237 lb 4 oz (107.6 kg)   BMI 42.03 kg/m   GEN: WDWN, NAD, Non-toxic, A & O x 3 HEENT: Atraumatic, Normocephalic. Neck supple. No masses, No LAD. Ears and Nose: No external deformity. CV: RRR, No M/G/R. No JVD. No thrill. No extra heart sounds. PULM: CTA B, no wheezes, crackles, rhonchi. No retractions. No resp. distress. No accessory muscle use. EXTR: No c/c/e NEURO Normal gait.  PSYCH: Normally interactive. Conversant. Not depressed or anxious appearing.  Calm demeanor.   Breast: This portion of the physical examination was chaperoned by Terese Door, CMA.  The breast is tender surrounding the bulla, and this is approximately 1.2 cm across, it is elevated and filled with clear fluid.  Surrounding it is tender, but there is no induration.  Laboratory and Imaging Data:  Assessment and Plan:   Skin bulla - Plan: Ambulatory referral to Dermatology  Breast pain - Plan: Ambulatory referral to Dermatology  Diabetes mellitus type 2, uncontrolled, without complications (HCC)  Bulla of unclear origin.  Also had an additional physician examined.  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  No prior outbreaks.  I will go ahead and treat the patient with some low-dose steroids given  her diabetes, and also place her on some Keflex.  We will have her follow-up with dermatology also.  Follow-up: No follow-ups on file.  Meds ordered this encounter  Medications  . DISCONTD: cephALEXin (KEFLEX) 500 MG capsule    Sig: Take 2 capsules (1,000 mg total) by mouth 2 (two) times daily.    Dispense:  40 capsule    Refill:  0  . DISCONTD: predniSONE (DELTASONE) 20 MG tablet    Sig: Take 1 tablet (20 mg total) by mouth daily with breakfast.    Dispense:  7 tablet    Refill:  0   Medications Discontinued  During This Encounter  Medication Reason  . amoxicillin (AMOXIL) 500 MG tablet    Orders Placed This Encounter  Procedures  . Ambulatory referral to Dermatology    Signed,  Karleen Hampshire T. Zakirah Weingart, MD   Allergies as of 08/03/2017      Reactions   Citalopram Other (See Comments)   Feels odd   Doxycycline Hyclate Nausea And Vomiting   Erythromycin Other (See Comments)   Irritated stomach   Montelukast Sodium Itching   Nitrofurantoin Nausea And Vomiting   Sulfa Antibiotics Nausea And Vomiting   Tramadol Hcl Other (See Comments)   Feels odd   Venlafaxine Hcl Other (See Comments)   Feels odd   Amoxicillin-pot Clavulanate Nausea And Vomiting   Clarithromycin Rash    See ER note 02/2017      Medication List        Accurate as of 08/03/17 11:59 PM. Always use your most recent med list.          aspirin EC 81 MG tablet Take 81 mg by mouth daily.   atorvastatin 40 MG tablet Commonly known as:  LIPITOR Take 1 tablet (40 mg total) by mouth daily.   cephALEXin 500 MG capsule Commonly known as:  KEFLEX Take 2 capsules (1,000 mg total) by mouth 2 (two) times daily.   citalopram 20 MG tablet Commonly known as:  CELEXA Take 1 tablet (20 mg total) by mouth daily.   glipiZIDE 5 MG 24 hr tablet Commonly known as:  GLUCOTROL XL Take 1 tablet (5 mg total) by mouth daily with breakfast.   lisinopril 20 MG tablet Commonly known as:  PRINIVIL,ZESTRIL TAKE 1 TABLET  BY MOUTH DAILY   metoprolol succinate 100 MG 24 hr tablet Commonly known as:  TOPROL-XL TAKE 1 TABLET BY MOUTH WITH FOOD   nystatin powder Commonly known as:  MYCOSTATIN/NYSTOP Apply topically 2 (two) times daily.   omeprazole 20 MG capsule Commonly known as:  PRILOSEC Take 1 capsule (20 mg total) by mouth daily.   OVER THE COUNTER MEDICATION Place 1 drop into both eyes daily as needed (dry eyes). Over the counter lubricating eye drop   predniSONE 20 MG tablet Commonly known as:  DELTASONE Take 1 tablet (20 mg total) by mouth daily with breakfast.

## 2017-08-03 NOTE — Patient Instructions (Signed)
REFERRALS TO SPECIALISTS, SPECIAL TESTS (MRI, CT, ULTRASOUNDS)  MARION or  Anastasiya will help you. ASK CHECK-IN FOR HELP.  Specialist appointment times vary a great deal, based on their schedule / openings. -- Some specialists have very long wait times. (Example. Dermatology)    

## 2017-09-08 ENCOUNTER — Emergency Department: Payer: Managed Care, Other (non HMO)

## 2017-09-08 ENCOUNTER — Encounter: Payer: Self-pay | Admitting: Emergency Medicine

## 2017-09-08 ENCOUNTER — Ambulatory Visit: Payer: Self-pay

## 2017-09-08 ENCOUNTER — Other Ambulatory Visit: Payer: Self-pay

## 2017-09-08 ENCOUNTER — Emergency Department
Admission: EM | Admit: 2017-09-08 | Discharge: 2017-09-08 | Disposition: A | Discharge status: Home / Self Care | Payer: Managed Care, Other (non HMO) | Attending: Emergency Medicine | Admitting: Emergency Medicine

## 2017-09-08 DIAGNOSIS — E119 Type 2 diabetes mellitus without complications: Secondary | ICD-10-CM | POA: Insufficient documentation

## 2017-09-08 DIAGNOSIS — Z7982 Long term (current) use of aspirin: Secondary | ICD-10-CM | POA: Insufficient documentation

## 2017-09-08 DIAGNOSIS — R05 Cough: Secondary | ICD-10-CM | POA: Insufficient documentation

## 2017-09-08 DIAGNOSIS — I10 Essential (primary) hypertension: Secondary | ICD-10-CM | POA: Insufficient documentation

## 2017-09-08 DIAGNOSIS — Z7984 Long term (current) use of oral hypoglycemic drugs: Secondary | ICD-10-CM | POA: Diagnosis not present

## 2017-09-08 DIAGNOSIS — R059 Cough, unspecified: Secondary | ICD-10-CM

## 2017-09-08 DIAGNOSIS — Z79899 Other long term (current) drug therapy: Secondary | ICD-10-CM | POA: Insufficient documentation

## 2017-09-08 DIAGNOSIS — J45909 Unspecified asthma, uncomplicated: Secondary | ICD-10-CM | POA: Diagnosis not present

## 2017-09-08 MED ORDER — BENZONATATE 100 MG PO CAPS
200.0000 mg | ORAL_CAPSULE | Freq: Once | ORAL | Status: AC
  Administered 2017-09-08: 200 mg via ORAL
  Filled 2017-09-08: qty 2

## 2017-09-08 MED ORDER — HYDROCOD POLST-CPM POLST ER 10-8 MG/5ML PO SUER: 5.0000 mL | Freq: Every evening | ORAL | 0 refills | Status: DC | PRN

## 2017-09-08 MED ORDER — BENZONATATE 100 MG PO CAPS: 200.0000 mg | ORAL_CAPSULE | Freq: Three times a day (TID) | ORAL | 0 refills | Status: DC | PRN

## 2017-09-08 NOTE — Telephone Encounter (Signed)
Noted, patient currently at [facility] ED.

## 2017-09-08 NOTE — Telephone Encounter (Signed)
I spoke with pt and she is going to [facility] now; pt is having pain in mid chest that goes thru to back when takes breath. FYI to Allayne GitelmanK Clark NP; Dr Alphonsus SiasLetvak out of office.

## 2017-09-08 NOTE — Discharge Instructions (Signed)
Pick up second medication from pharmacy.

## 2017-09-08 NOTE — ED Provider Notes (Signed)
[facility] Emergency Department Provider Note   ____________________________________________   First MD Initiated Contact with Patient 09/08/17 1433     (approximate)  I have reviewed the triage vital signs and the nursing notes.   HISTORY  Chief Complaint Cough (dry, 2-3 days)    HPI Tami Jones is a 62 y.o. female patient complain of dry cough which is worse at night when laying down.  Patient stable and unable to sleep for last 2-3 nights secondary to cough.  Patient with mild persistent cough throughout the day.  Patient also complained of left lateral chest wall pain secondary to coughing spells.  Patient rates her chest wall pain as a 6/10.  Patient described the pain is "aching".  No palliative measures for both complaints.   Past Medical History:  Diagnosis Date  . Anxiety   . Asthma   . 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 Thad Rangereynolds  . Diabetes mellitus   . GERD (gastroesophageal reflux disease)   . Hyperlipidemia   . Hypertension     Patient Active Problem List   Diagnosis Date Noted  . Acute non-recurrent frontal sinusitis 07/27/2017  . SOB (shortness of breath) 03/08/2017  . Vestibular dizziness 01/21/2016  . 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 Tick bite 07/15/2015  . Cerebrovascular disease 07/17/2014  . Preventative health care 06/25/2014  . Right sided sciatica 03/08/2013  . MENOPAUSAL SYNDROME 09/10/2008  . Mood disorder (HCC) 09/15/2007  . Essential hypertension, benign 08/10/2006  . Diabetes mellitus type 2, uncontrolled, without complications (HCC) 06/17/2006  . HYPERCHOLESTEROLEMIA 06/17/2006  . GERD 06/17/2006    Past Surgical History:  Procedure Laterality Date  . CARDIOVASCULAR STRESS TEST  1/15   myoview. EF 60%  . CESAREAN SECTION    . ESOPHAGOGASTRODUODENOSCOPY  05/2005  . TONSILLECTOMY AND ADENOIDECTOMY      Prior to Admission medications   Medication Sig Start Date  End Date Taking? Authorizing Provider  aspirin EC 81 MG tablet Take 81 mg by mouth daily.    [provider]  atorvastatin (LIPITOR) 40 MG tablet Take 1 tablet (40 mg total) by mouth daily. 04/08/17   Karie SchwalbeLetvak, Richard I, MD  benzonatate (TESSALON PERLES) 100 MG capsule Take 2 capsules (200 mg total) by mouth 3 (three) times daily as needed. 09/08/17 09/08/18  Joni ReiningSmith, Ronald K, PA-C  cephALEXin (KEFLEX) 500 MG capsule Take 2 capsules (1,000 mg total) by mouth 2 (two) times daily. 08/03/17   Copland, Karleen HampshireSpencer, MD  chlorpheniramine-HYDROcodone (TUSSIONEX PENNKINETIC ER) 10-8 MG/5ML SUER Take 5 mLs by mouth at bedtime as needed for cough. 09/08/17   Joni ReiningSmith, Ronald K, PA-C  citalopram (CELEXA) 20 MG tablet Take 1 tablet (20 mg total) by mouth daily. 04/08/17   Karie SchwalbeLetvak, Richard I, MD  glipiZIDE (GLUCOTROL XL) 5 MG 24 hr tablet Take 1 tablet (5 mg total) by mouth daily with breakfast. 07/27/17   Karie SchwalbeLetvak, Richard I, MD  lisinopril (PRINIVIL,ZESTRIL) 20 MG tablet TAKE 1 TABLET BY MOUTH DAILY 07/12/17   Tillman AbideLetvak, Richard I, MD  metoprolol succinate (TOPROL-XL) 100 MG 24 hr tablet TAKE 1 TABLET BY MOUTH WITH FOOD 07/18/17   Tillman AbideLetvak, Richard I, MD  nystatin (MYCOSTATIN/NYSTOP) powder Apply topically 2 (two) times daily. 04/08/17   Karie SchwalbeLetvak, Richard I, MD  omeprazole (PRILOSEC) 20 MG capsule Take 1 capsule (20 mg total) by mouth daily. 03/08/17   Karie SchwalbeLetvak, Richard I, MD  OVER THE COUNTER MEDICATION Place 1 drop into both eyes daily as  needed (dry eyes). Over the counter lubricating eye drop    [provider]  predniSONE (DELTASONE) 20 MG tablet Take 1 tablet (20 mg total) by mouth daily with breakfast. 08/03/17   Copland, Karleen Hampshire, MD    Allergies Citalopram; Doxycycline hyclate; Erythromycin; Montelukast sodium; Nitrofurantoin; Sulfa antibiotics; Tramadol hcl; Venlafaxine hcl; Amoxicillin-pot clavulanate; and Clarithromycin  Family History  Problem Relation Age of Onset  . Coronary artery disease Mother   .  Diabetes Mellitus II Mother   . Heart attack Mother 61  . Diabetes Mellitus II Maternal Grandmother     Social History Social History   Tobacco Use  . Smoking status: Never Smoker  . Smokeless tobacco: Never Used  Substance Use Topics  . Alcohol use: No    Alcohol/week: 0.0 oz    Comment: wine occassionally  . Drug use: No    Review of Systems Constitutional: No fever/chills Eyes: No visual changes. ENT: No sore throat. Cardiovascular: Denies chest pain. Respiratory: Denies shortness of breath.  Nonproductive cough Gastrointestinal: No abdominal pain.  No nausea, no vomiting.  No diarrhea.  No constipation. Genitourinary: Negative for dysuria. Musculoskeletal: Left lateral chest wall pain. Skin: Negative for rash. Neurological: Negative for headaches, focal weakness or numbness. Psychiatric: Anxiety.  .Endocrine:Diabetes, hyperlipidemia, and hypertension. Allergic/Immunilogical: See extensive medication list. ____________________________________________   PHYSICAL EXAM:  VITAL SIGNS: ED Triage Vitals  Enc Vitals Group     BP 09/08/17 1357 (!) 161/67     Pulse Rate 09/08/17 1357 72     Resp 09/08/17 1357 18     Temp 09/08/17 1357 97.8 F (36.6 C)     Temp src --      SpO2 09/08/17 1357 98 %     Weight 09/08/17 1359 239 lb (108.4 kg)     Height 09/08/17 1359 5\' 4"  (1.626 m)     Head Circumference --      Peak Flow --      Pain Score 09/08/17 1358 6     Pain Loc --      Pain Edu? --      Excl. in GC? --    Constitutional: Alert and oriented. Well appearing and in no acute distress. Nose: No congestion/rhinnorhea. Mouth/Throat: Mucous membranes are moist.  Oropharynx non-erythematous. Neck: No stridor.  Hematological/Lymphatic/Immunilogical: No cervical lymphadenopathy. Cardiovascular: Normal rate, regular rhythm. Grossly normal heart sounds.  Good peripheral circulation. Respiratory: Normal respiratory effort.  No retractions. Lungs CTAB. Neurologic:  Normal  speech and language. No gross focal neurologic deficits are appreciated. No gait instability. Skin:  Skin is warm, dry and intact. No rash noted. Psychiatric: Mood and affect are normal. Speech and behavior are normal.  ____________________________________________   LABS (all labs ordered are listed, but only abnormal results are displayed)  Labs Reviewed - No data to display ____________________________________________  EKG   ____________________________________________  RADIOLOGY  ED MD interpretation:    Official radiology report(s): Dg Chest 2 View  Result Date: 09/08/2017 CLINICAL DATA:  Dry cough at night for 3 days EXAM: CHEST - 2 VIEW COMPARISON:  05/24/2017 FINDINGS: The heart is normal in size allowing for very low lung volumes. Lungs are under aerated and grossly clear. No pneumothorax. No pleural effusion. IMPRESSION: No active cardiopulmonary disease. Electronically Signed   By: Jolaine Click M.D.   On: 09/08/2017 14:29    ____________________________________________   PROCEDURES  Procedure(s) performed: None  Procedures  Critical Care performed: No  ____________________________________________   INITIAL IMPRESSION / ASSESSMENT AND PLAN / ED  COURSE  As part of my medical decision making, I reviewed the following data within the electronic MEDICAL RECORD NUMBER    Nonproductive cough.  Discussed negative checks x-ray findings with patient.  Patient given discharge care instruction advised take medication as directed.  Patient advised follow-up PCP.      ____________________________________________   FINAL CLINICAL IMPRESSION(S) / ED DIAGNOSES  Final diagnoses:  Cough     ED Discharge Orders        Ordered    chlorpheniramine-HYDROcodone (TUSSIONEX PENNKINETIC ER) 10-8 MG/5ML SUER  At bedtime PRN     09/08/17 1453    benzonatate (TESSALON PERLES) 100 MG capsule  3 times daily PRN     09/08/17 1453       Note:  This document was prepared  using Dragon voice recognition software and may include unintentional dictation errors.    Joni Reining, PA-C 09/08/17 1504    Sharman Cheek, MD 09/12/17 Perlie Mayo

## 2017-09-08 NOTE — Telephone Encounter (Signed)
Pt. Reports pain with deep breathing - hurts in the middle of her chest and in her shoulder blades. Can not lay down to sleep due to shortness of breath. Hears some wheezing. Had "bronchitis 2-3 weeks ago. I was coughing some last week." "Just a dry cough." Pt. Sounds weak - states she was up all night. Instructed pt. To go to ED for evaluation. Verbalizes understanding - will have her daughter take her. Reason for Disposition . Patient sounds very sick or weak to the triager  Answer Assessment - Initial Assessment Questions 1. RESPIRATORY STATUS: "Describe your breathing?" (e.g., wheezing, shortness of breath, unable to speak, severe coughing)     Short of breat and pain in back at shoulder blades 2. ONSET: "When did this breathing problem begin?"      Pain started yesterday 3. PATTERN "Does the difficult breathing come and go, or has it been constant since it started?"      Constant 4. SEVERITY: "How bad is your breathing?" (e.g., mild, moderate, severe)    - MILD: No SOB at rest, mild SOB with walking, speaks normally in sentences, can lay down, no retractions, pulse < 100.    - MODERATE: SOB at rest, SOB with minimal exertion and prefers to sit, cannot lie down flat, speaks in phrases, mild retractions, audible wheezing, pulse 100-120.    - SEVERE: Very SOB at rest, speaks in single words, struggling to breathe, sitting hunched forward, retractions, pulse > 120      Can not lay down flat 5. RECURRENT SYMPTOM: "Have you had difficulty breathing before?" If so, ask: "When was the last time?" and "What happened that time?"      Had bronchitis 2-3 weeks ago 6. CARDIAC HISTORY: "Do you have any history of heart disease?" (e.g., heart attack, angina, bypass surgery, angioplasty)      No 7. LUNG HISTORY: "Do you have any history of lung disease?"  (e.g., pulmonary embolus, asthma, emphysema)     No 8. CAUSE: "What do you think is causing the breathing problem?"      Unsure 9. OTHER SYMPTOMS: "Do  you have any other symptoms? (e.g., dizziness, runny nose, cough, chest pain, fever)     Pain front of chest at the breast 10. PREGNANCY: "Is there any chance you are pregnant?" "When was your last menstrual period?"       No 11. TRAVEL: "Have you traveled out of the country in the last month?" (e.g., travel history, exposures)       No  Protocols used: BREATHING DIFFICULTY-A-AH

## 2017-09-08 NOTE — ED Triage Notes (Signed)
Pt states dry cough at night that keeps her up x 2-3 days

## 2017-09-12 ENCOUNTER — Telehealth: Payer: Self-pay

## 2017-09-12 NOTE — Telephone Encounter (Signed)
Tried to call pt to see how she was doing after recent ER visit for cough. He VM is full.   PEC, it pt calls back, please find out how she is feeling and document here. Thanks

## 2017-10-28 ENCOUNTER — Ambulatory Visit: Payer: Managed Care, Other (non HMO) | Admitting: Internal Medicine

## 2017-10-28 DIAGNOSIS — Z0289 Encounter for other administrative examinations: Secondary | ICD-10-CM

## 2017-11-24 ENCOUNTER — Telehealth: Payer: Self-pay | Admitting: Internal Medicine

## 2017-11-24 MED ORDER — GLUCOSE BLOOD VI STRP: ORAL_STRIP | 3 refills | Status: DC

## 2017-11-24 MED ORDER — ONETOUCH ULTRA 2 W/DEVICE KIT: 1.0000 | PACK | Freq: Once | 0 refills | Status: AC

## 2017-11-24 NOTE — Telephone Encounter (Signed)
Rxs sent electronically.  

## 2017-11-24 NOTE — Telephone Encounter (Signed)
Copied from CRM [id number]. Topic: Quick Communication - See Telephone Encounter >> Nov 24, 2017  3:56 PM Herby Abraham C wrote: CRM for notification. See Telephone encounter for: 11/24/17.  Pt is requesting a new One Touch meter and also test strips   Pharmacy: [id number] - [place], [place] - [phone]

## 2017-12-23 ENCOUNTER — Encounter: Payer: Self-pay | Admitting: Internal Medicine

## 2017-12-23 ENCOUNTER — Ambulatory Visit (INDEPENDENT_AMBULATORY_CARE_PROVIDER_SITE_OTHER): Payer: Managed Care, Other (non HMO) | Admitting: Internal Medicine

## 2017-12-23 ENCOUNTER — Ambulatory Visit (INDEPENDENT_AMBULATORY_CARE_PROVIDER_SITE_OTHER)
Admission: RE | Admit: 2017-12-23 | Discharge: 2017-12-23 | Disposition: A | Discharge status: Home / Self Care | Payer: Managed Care, Other (non HMO) | Source: Ambulatory Visit | Attending: Internal Medicine | Admitting: Internal Medicine

## 2017-12-23 ENCOUNTER — Telehealth: Payer: Self-pay | Admitting: Internal Medicine

## 2017-12-23 VITALS — BP 162/96 | HR 83 | Temp 97.9°F | Resp 24 | Ht 63.0 in | Wt 235.0 lb

## 2017-12-23 DIAGNOSIS — R0781 Pleurodynia: Secondary | ICD-10-CM

## 2017-12-23 MED ORDER — CEFUROXIME AXETIL 250 MG PO TABS: 250.0000 mg | ORAL_TABLET | Freq: Two times a day (BID) | ORAL | 0 refills | Status: DC

## 2017-12-23 MED ORDER — HYDROCODONE-HOMATROPINE 5-1.5 MG/5ML PO SYRP: 5.0000 mL | ORAL_SOLUTION | Freq: Every evening | ORAL | 0 refills | Status: DC | PRN

## 2017-12-23 MED ORDER — PREDNISONE 20 MG PO TABS: 40.0000 mg | ORAL_TABLET | Freq: Every day | ORAL | 0 refills | Status: DC

## 2017-12-23 NOTE — Progress Notes (Signed)
Subjective:    Patient ID: Tami Jones, female    DOB: 1955-05-25, 62 y.o.   MRN: [id number]  HPI Here due to persistent cough  Had a cold 4 weeks ago---did seem to get better Now feels worse again Having some left back pain---with breathing Also left chest pain---also pleuritic  Constant cough Keeping her up Throwing up after coughing at times---trying to get sputum up (gets stuck) Has sweats and chills Some fever--- up to 100 Breathing "don't feel good"----achy with breathing in and out  Throat and upper chest congestion No sig head congestion Left ear pain and sore throat----uses heating pad for ear Some headache--top of head  Tried coricidin--no help  Current Outpatient Medications on File Prior to Visit  Medication Sig Dispense Refill  . aspirin EC 81 MG tablet Take 81 mg by mouth daily.    Marland Kitchen atorvastatin (LIPITOR) 40 MG tablet Take 1 tablet (40 mg total) by mouth daily. 90 tablet 3  . benzonatate (TESSALON PERLES) 100 MG capsule Take 2 capsules (200 mg total) by mouth 3 (three) times daily as needed. 30 capsule 0  . cephALEXin (KEFLEX) 500 MG capsule Take 2 capsules (1,000 mg total) by mouth 2 (two) times daily. 40 capsule 0  . citalopram (CELEXA) 20 MG tablet Take 1 tablet (20 mg total) by mouth daily. 90 tablet 3  . glipiZIDE (GLUCOTROL XL) 5 MG 24 hr tablet Take 1 tablet (5 mg total) by mouth daily with breakfast. 90 tablet 3  . glucose blood (ONE TOUCH ULTRA TEST) test strip Use to check blood sugar once a day 100 each 3  . lisinopril (PRINIVIL,ZESTRIL) 20 MG tablet TAKE 1 TABLET BY MOUTH DAILY 30 tablet 10  . metoprolol succinate (TOPROL-XL) 100 MG 24 hr tablet TAKE 1 TABLET BY MOUTH WITH FOOD 30 tablet 10  . nystatin (MYCOSTATIN/NYSTOP) powder Apply topically 2 (two) times daily. 60 g 3  . omeprazole (PRILOSEC) 20 MG capsule Take 1 capsule (20 mg total) by mouth daily. 90 capsule 3  . OVER THE COUNTER MEDICATION Place 1 drop into both eyes daily as needed (dry  eyes). Over the counter lubricating eye drop     No current facility-administered medications on file prior to visit.     Allergies  Allergen Reactions  . Citalopram Other (See Comments)    Feels odd  . Doxycycline Hyclate Nausea And Vomiting  . Erythromycin Other (See Comments)    Irritated stomach  . Montelukast Sodium Itching  . Nitrofurantoin Nausea And Vomiting  . Sulfa Antibiotics Nausea And Vomiting  . Tramadol Hcl Other (See Comments)    Feels odd  . Venlafaxine Hcl Other (See Comments)    Feels odd  . Amoxicillin-Pot Clavulanate Nausea And Vomiting  . Clarithromycin Rash     See ER note 02/2017    Past Medical History:  Diagnosis Date  . Anxiety   . Asthma   . 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 Thad Ranger  . Diabetes mellitus   . GERD (gastroesophageal reflux disease)   . Hyperlipidemia   . Hypertension     Past Surgical History:  Procedure Laterality Date  . CARDIOVASCULAR STRESS TEST  1/15   myoview. EF 60%  . CESAREAN SECTION    . ESOPHAGOGASTRODUODENOSCOPY  05/2005  . TONSILLECTOMY AND ADENOIDECTOMY      Family History  Problem Relation Age of Onset  . Coronary artery disease Mother   . Diabetes Mellitus II Mother   .  Heart attack Mother 21  . Diabetes Mellitus II Maternal Grandmother     Social History   Socioeconomic History  . Marital status: Divorced    Spouse name: Not on file  . Number of children: 2  . Years of education: Not on file  . Highest education level: Not on file  Occupational History  . Occupation: LAB TECH    Employer: [employer]  Social Needs  . Financial resource strain: Not on file  . Food insecurity:    Worry: Not on file    Inability: Not on file  . Transportation needs:    Medical: Not on file    Non-medical: Not on file  Tobacco Use  . Smoking status: Never Smoker  . Smokeless tobacco: Never Used  Substance and Sexual Activity  . Alcohol use: No    Alcohol/week: 0.0  standard drinks    Comment: wine occassionally  . Drug use: No  . Sexual activity: Yes    Birth control/protection: Condom  Lifestyle  . Physical activity:    Days per week: Not on file    Minutes per session: Not on file  . Stress: Not on file  Relationships  . Social connections:    Talks on phone: Not on file    Gets together: Not on file    Attends religious service: Not on file    Active member of club or organization: Not on file    Attends meetings of clubs or organizations: Not on file    Relationship status: Not on file  . Intimate partner violence:    Fear of current or ex partner: Not on file    Emotionally abused: Not on file    Physically abused: Not on file    Forced sexual activity: Not on file  Other Topics Concern  . Not on file  Social History Narrative  . Not on file   Review of Systems  No rash Appetite is off--gets nausea Some loose stools Sugars up and down due to inconsistent eating     Objective:   Physical Exam  Constitutional:  Looks miserable but not in distress  HENT:  Mouth/Throat: Oropharynx is clear and moist. No oropharyngeal exudate.  TMs normal Mild nasal congestion Mild maxillary tenderness  Neck: No thyromegaly present.  Respiratory: Effort normal and breath sounds normal. No respiratory distress. She has no wheezes. She has no rales.  No dullness  Lymphadenopathy:    She has no cervical adenopathy.           Assessment & Plan:

## 2017-12-23 NOTE — Telephone Encounter (Signed)
Pt stated she was going to call and have fmla paperwork faxed.

## 2017-12-23 NOTE — Assessment & Plan Note (Addendum)
Symptoms suggestive of atypical infection ?sinus involvement Needs CXR due to the pleuritic chest pain--this is normal  Will give ceftin for a week in case sinus (can't take doxy and allergic to clarithromycin) 3 days of prednisone Hydrocodone cough syrup  Will need FMLA again for missed work--she will get forms

## 2017-12-26 ENCOUNTER — Telehealth: Payer: Self-pay | Admitting: Internal Medicine

## 2017-12-26 NOTE — Telephone Encounter (Signed)
Pt stated the Cefuroxime that was prescribed made her itch really bad, and skin irritated and red.

## 2017-12-26 NOTE — Telephone Encounter (Signed)
Tried to call pt. VM is full. If pt calls back, please advise her.

## 2017-12-26 NOTE — Telephone Encounter (Signed)
Pt returned call. Pt goes to work a 9:30 so can reach her at [phone] work number.

## 2017-12-26 NOTE — Telephone Encounter (Signed)
Tried calling pt mail box full 

## 2017-12-26 NOTE — Telephone Encounter (Signed)
Have her stop it for a day and see what happens. If her respiratory symptoms are better, the 3 days of treatment may be enough. If ongoing symptoms, she can re-try it if the skin is better after the day off Probably best if she tries to hold off on ongoing treatment given her reaction to many antibiotics

## 2017-12-28 DIAGNOSIS — Z0279 Encounter for issue of other medical certificate: Secondary | ICD-10-CM

## 2017-12-28 NOTE — Telephone Encounter (Signed)
Tried calling pt.  Work could not find her

## 2017-12-28 NOTE — Telephone Encounter (Signed)
Spoke with pt to verify dates she needed for fmla.  Pt wanted to know if you could increase her days off .  She would like to have 10 per month.  She stated she has a cold and doesn't  feel good.  I tried to explain to her that her fmla was for malignant hypertension/cerebrovasular disease/ mood disorder.   She stated she spoke to you about this at her last visit.     FMLA paperwork in dr Karle Starchletvak's in box for review and signature

## 2017-12-28 NOTE — Telephone Encounter (Signed)
I did increase the frequency but had to put in new diagnoses---post concussive syndrome/headaches and mood disorder

## 2017-12-28 NOTE — Telephone Encounter (Signed)
Tried to call pt back. Received a message stating pt has restrictions on the line that will not allow the call to go through.

## 2017-12-29 NOTE — Telephone Encounter (Signed)
Tried calling pt  If pt calls back please let her know paperwork has been faxed and a copy here for her.  Dr Alphonsus Siasletvak increased days off  To 2-10 days per 30 days  Copy for pt Copy for scan Copy for billing

## 2017-12-29 NOTE — Telephone Encounter (Signed)
Paperwork faxed 11/13

## 2018-01-05 ENCOUNTER — Emergency Department
Admission: EM | Admit: 2018-01-05 | Discharge: 2018-01-06 | Disposition: A | Discharge status: Home / Self Care | Payer: Managed Care, Other (non HMO) | Attending: Emergency Medicine | Admitting: Emergency Medicine

## 2018-01-05 ENCOUNTER — Emergency Department: Payer: Managed Care, Other (non HMO)

## 2018-01-05 DIAGNOSIS — Z7982 Long term (current) use of aspirin: Secondary | ICD-10-CM | POA: Insufficient documentation

## 2018-01-05 DIAGNOSIS — E1165 Type 2 diabetes mellitus with hyperglycemia: Secondary | ICD-10-CM | POA: Diagnosis not present

## 2018-01-05 DIAGNOSIS — Z7984 Long term (current) use of oral hypoglycemic drugs: Secondary | ICD-10-CM | POA: Diagnosis not present

## 2018-01-05 DIAGNOSIS — Z8673 Personal history of transient ischemic attack (TIA), and cerebral infarction without residual deficits: Secondary | ICD-10-CM | POA: Diagnosis not present

## 2018-01-05 DIAGNOSIS — R079 Chest pain, unspecified: Secondary | ICD-10-CM

## 2018-01-05 DIAGNOSIS — R739 Hyperglycemia, unspecified: Secondary | ICD-10-CM

## 2018-01-05 DIAGNOSIS — I10 Essential (primary) hypertension: Secondary | ICD-10-CM | POA: Diagnosis not present

## 2018-01-05 DIAGNOSIS — F419 Anxiety disorder, unspecified: Secondary | ICD-10-CM | POA: Insufficient documentation

## 2018-01-05 DIAGNOSIS — Z79899 Other long term (current) drug therapy: Secondary | ICD-10-CM | POA: Diagnosis not present

## 2018-01-05 DIAGNOSIS — J45909 Unspecified asthma, uncomplicated: Secondary | ICD-10-CM | POA: Insufficient documentation

## 2018-01-05 DIAGNOSIS — R0789 Other chest pain: Secondary | ICD-10-CM | POA: Diagnosis not present

## 2018-01-05 MED ORDER — KETOROLAC TROMETHAMINE 30 MG/ML IJ SOLN
15.0000 mg | Freq: Once | INTRAMUSCULAR | Status: AC
  Administered 2018-01-06: 15 mg via INTRAVENOUS
  Filled 2018-01-05: qty 1

## 2018-01-05 MED ORDER — SODIUM CHLORIDE 0.9 % IV BOLUS
1000.0000 mL | Freq: Once | INTRAVENOUS | Status: AC
  Administered 2018-01-06: 1000 mL via INTRAVENOUS

## 2018-01-05 MED ORDER — HYDROCODONE-ACETAMINOPHEN 5-325 MG PO TABS
0.5000 | ORAL_TABLET | Freq: Once | ORAL | Status: AC
  Administered 2018-01-06: 0.5 via ORAL
  Filled 2018-01-05: qty 1

## 2018-01-05 NOTE — ED Notes (Signed)
Pt ambulatory to toilet independently. 

## 2018-01-05 NOTE — ED Provider Notes (Signed)
[facility] Emergency Department Provider Note   ____________________________________________   First MD Initiated Contact with Patient 01/05/18 2347     (approximate)  I have reviewed the triage vital signs and the nursing notes.   HISTORY  Chief Complaint Chest Pain; Hyperglycemia; and Hypertension    HPI Tami Jones is a 62 y.o. female who presents to the ED from home via EMS with a chief complaint of chest pain.  Patient reports cold-like symptoms with cough and congestion last week.  Felt better on Monday, then began having sharp left-sided chest pain beneath her breast on Tuesday.  Pain has been intermittent and worsened on supine position.  Denies associated fever, chills, abdominal pain, diaphoresis, nausea or vomiting.  Denies recent travel or trauma.   Past Medical History:  Diagnosis Date  . Anxiety   . Asthma   . 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 Thad Ranger  . Diabetes mellitus   . GERD (gastroesophageal reflux disease)   . Hyperlipidemia   . Hypertension     Patient Active Problem List   Diagnosis Date Noted  . Pleuritic chest pain 12/23/2017  . Acute non-recurrent frontal sinusitis 07/27/2017  . SOB (shortness of breath) 03/08/2017  . Vestibular dizziness 01/21/2016  . 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 Tick bite 07/15/2015  . Cerebrovascular disease 07/17/2014  . Preventative health care 06/25/2014  . Right sided sciatica 03/08/2013  . MENOPAUSAL SYNDROME 09/10/2008  . Mood disorder (HCC) 09/15/2007  . Essential hypertension, benign 08/10/2006  . Diabetes mellitus type 2, uncontrolled, without complications (HCC) 06/17/2006  . HYPERCHOLESTEROLEMIA 06/17/2006  . GERD 06/17/2006    Past Surgical History:  Procedure Laterality Date  . CARDIOVASCULAR STRESS TEST  1/15   myoview. EF 60%  . CESAREAN SECTION    . ESOPHAGOGASTRODUODENOSCOPY  05/2005  . TONSILLECTOMY AND ADENOIDECTOMY       Prior to Admission medications   Medication Sig Start Date End Date Taking? Authorizing Provider  aspirin EC 81 MG tablet Take 81 mg by mouth daily.    [provider]  atorvastatin (LIPITOR) 40 MG tablet Take 1 tablet (40 mg total) by mouth daily. 04/08/17   Karie Schwalbe, MD  benzonatate (TESSALON PERLES) 100 MG capsule Take 2 capsules (200 mg total) by mouth 3 (three) times daily as needed. 09/08/17 09/08/18  Joni Reining, PA-C  cefUROXime (CEFTIN) 250 MG tablet Take 1 tablet (250 mg total) by mouth 2 (two) times daily with a meal. 12/23/17   Karie Schwalbe, MD  cephALEXin (KEFLEX) 500 MG capsule Take 2 capsules (1,000 mg total) by mouth 2 (two) times daily. 08/03/17   Copland, Karleen Hampshire, MD  citalopram (CELEXA) 20 MG tablet Take 1 tablet (20 mg total) by mouth daily. 04/08/17   Karie Schwalbe, MD  glipiZIDE (GLUCOTROL XL) 5 MG 24 hr tablet Take 1 tablet (5 mg total) by mouth daily with breakfast. 07/27/17   Tillman Abide I, MD  glucose blood (ONE TOUCH ULTRA TEST) test strip Use to check blood sugar once a day 11/24/17   Karie Schwalbe, MD  HYDROcodone-acetaminophen (NORCO) 5-325 MG tablet Take 1 tablet by mouth every 6 (six) hours as needed for moderate pain. 01/06/18   Irean Hong, MD  HYDROcodone-homatropine [facility]) 5-1.5 MG/5ML syrup Take 5 mLs by mouth at bedtime as needed for cough. 12/23/17   Karie Schwalbe, MD  ibuprofen (ADVIL,MOTRIN) 800 MG tablet Take 1 tablet (800 mg total) by  mouth every 8 (eight) hours as needed for moderate pain. 01/06/18   Irean Hong, MD  lisinopril (PRINIVIL,ZESTRIL) 20 MG tablet TAKE 1 TABLET BY MOUTH DAILY 07/12/17   Tillman Abide I, MD  metoprolol succinate (TOPROL-XL) 100 MG 24 hr tablet TAKE 1 TABLET BY MOUTH WITH FOOD 07/18/17   Tillman Abide I, MD  nystatin (MYCOSTATIN/NYSTOP) powder Apply topically 2 (two) times daily. 04/08/17   Karie Schwalbe, MD  omeprazole (PRILOSEC) 20 MG capsule Take 1 capsule (20 mg total) by mouth  daily. 03/08/17   Karie Schwalbe, MD  OVER THE COUNTER MEDICATION Place 1 drop into both eyes daily as needed (dry eyes). Over the counter lubricating eye drop    [provider]  predniSONE (DELTASONE) 20 MG tablet Take 2 tablets (40 mg total) by mouth daily. 12/23/17   Karie Schwalbe, MD    Allergies Citalopram; Doxycycline hyclate; Erythromycin; Montelukast sodium; Nitrofurantoin; Sulfa antibiotics; Tramadol hcl; Venlafaxine hcl; Amoxicillin-pot clavulanate; and Clarithromycin  Family History  Problem Relation Age of Onset  . Coronary artery disease Mother   . Diabetes Mellitus II Mother   . Heart attack Mother 65  . Diabetes Mellitus II Maternal Grandmother     Social History Social History   Tobacco Use  . Smoking status: Never Smoker  . Smokeless tobacco: Never Used  Substance Use Topics  . Alcohol use: No    Alcohol/week: 0.0 standard drinks    Comment: wine occassionally  . Drug use: No    Review of Systems  Constitutional: No fever/chills Eyes: No visual changes. ENT: No sore throat. Cardiovascular: Positive for chest pain. Respiratory: Denies shortness of breath. Gastrointestinal: No abdominal pain.  No nausea, no vomiting.  No diarrhea.  No constipation. Genitourinary: Negative for dysuria. Musculoskeletal: Negative for back pain. Skin: Negative for rash. Neurological: Negative for headaches, focal weakness or numbness.   ____________________________________________   PHYSICAL EXAM:  VITAL SIGNS: ED Triage Vitals  Enc Vitals Group     BP 01/05/18 2235 (!) 182/102     Pulse Rate 01/05/18 2235 (!) 103     Resp 01/05/18 2235 (!) 23     Temp 01/05/18 2235 97.8 F (36.6 C)     Temp Source 01/05/18 2235 Oral     SpO2 01/05/18 2235 98 %     Weight 01/05/18 2237 235 lb (106.6 kg)     Height 01/05/18 2237 5\' 3"  (1.6 m)     Head Circumference --      Peak Flow --      Pain Score 01/05/18 2237 7     Pain Loc --      Pain Edu? --      Excl.  in GC? --     Constitutional: Alert and oriented. Well appearing and in no acute distress. Eyes: Conjunctivae are normal. PERRL. EOMI. Head: Atraumatic. Nose: No congestion/rhinnorhea. Mouth/Throat: Mucous membranes are moist.  Oropharynx non-erythematous. Neck: No stridor.   Cardiovascular: Normal rate, regular rhythm. Grossly normal heart sounds.  Good peripheral circulation. Respiratory: Normal respiratory effort.  No retractions. Lungs CTAB.  Left anterior ribs beneath breast tender to palpation.  No splinting.  No crepitus.  Patient is also tender to her left breast. Gastrointestinal: Soft and nontender. No distention. No abdominal bruits. No CVA tenderness. Musculoskeletal: No lower extremity tenderness nor edema.  No joint effusions. Neurologic:  Normal speech and language. No gross focal neurologic deficits are appreciated. No gait instability. Skin:  Skin is warm, dry and intact. No  rash noted. Psychiatric: Mood and affect are normal. Speech and behavior are normal.  ____________________________________________   LABS (all labs ordered are listed, but only abnormal results are displayed)  Labs Reviewed  COMPREHENSIVE METABOLIC PANEL - Abnormal; Notable for the following components:      Result Value   Glucose, Bld 355 (*)    Creatinine, Ser 1.16 (*)    GFR calc non Af Amer 50 (*)    GFR calc Af Amer 58 (*)    All other components within normal limits  GLUCOSE, CAPILLARY - Abnormal; Notable for the following components:   Glucose-Capillary 207 (*)    All other components within normal limits  CBC WITH DIFFERENTIAL/PLATELET  LIPASE, BLOOD  TROPONIN I  CBG MONITORING, ED   ____________________________________________  EKG  ED ECG REPORT I, Kaelon Weekes J, the attending physician, personally viewed and interpreted this ECG.   Date: 01/05/2018  EKG Time: 2235  Rate: 98  Rhythm: normal EKG, normal sinus rhythm  Axis: Normal  Intervals:none  ST&T Change:  Nonspecific  ____________________________________________  RADIOLOGY  ED MD interpretation: No acute cardiopulmonary process  Official radiology report(s): Dg Chest 2 View  Result Date: 01/06/2018 CLINICAL DATA:  Chest pain and hypertension. EXAM: CHEST - 2 VIEW COMPARISON:  12/23/2017 FINDINGS: Shallow inspiration. Normal heart size and pulmonary vascularity. No focal airspace disease or consolidation in the lungs. No blunting of costophrenic angles. No pneumothorax. Mediastinal contours appear intact. Degenerative changes in the spine. IMPRESSION: No active cardiopulmonary disease. Electronically Signed   By: Burman Nieves M.D.   On: 01/06/2018 00:11    ____________________________________________   PROCEDURES  Procedure(s) performed: None  Procedures  Critical Care performed: No  ____________________________________________   INITIAL IMPRESSION / ASSESSMENT AND PLAN / ED COURSE  As part of my medical decision making, I reviewed the following data within the electronic MEDICAL RECORD NUMBER History obtained from family, Nursing notes reviewed and incorporated, Labs reviewed, EKG interpreted, Old chart reviewed, Radiograph reviewed and Notes from prior ED visits   62 year old female with diabetes, hypertension, hyperlipidemia who presents with reproducible left chest pain in the setting of recent cold symptoms. Differential diagnosis includes, but is not limited to, ACS, aortic dissection, pulmonary embolism, cardiac tamponade, pneumothorax, pneumonia, pericarditis, myocarditis, GI-related causes including esophagitis/gastritis, and musculoskeletal chest wall pain.    Will obtain cardiac work-up, chest x-ray.  Administer 15 mg IV Toradol and one half Norco tablet for pain.  Initiate IV fluid resuscitation for hyperglycemia.  Will reassess.  Clinical Course as of Jan 07 651  Fri Jan 06, 2018  1308 Patient feeling significantly better.  She was kept for several hours in the ED  because her daughter went home to get some rest and will pick her up now.  Will discharge home on NSAIDs, Norco and she will follow-up closely with her PCP.  Strict return precautions given.  Patient verbalizes understanding and agrees with plan of care.   [JS]    Clinical Course User Index [JS] Irean Hong, MD     ____________________________________________   FINAL CLINICAL IMPRESSION(S) / ED DIAGNOSES  Final diagnoses:  Chest wall pain  Hyperglycemia  Chest pain, unspecified type     ED Discharge Orders         Ordered    ibuprofen (ADVIL,MOTRIN) 800 MG tablet  Every 8 hours PRN     01/06/18 0354    HYDROcodone-acetaminophen (NORCO) 5-325 MG tablet  Every 6 hours PRN     01/06/18 0354  Note:  This document was prepared using Dragon voice recognition software and may include unintentional dictation errors.    Irean HongSung, Katlyne Nishida J, MD 01/06/18 [phone]

## 2018-01-05 NOTE — ED Triage Notes (Signed)
PT FROM HOME VIA EMS , WITH REPORT OF CHEST PAIN 6/10 . AS WELL AS HTN, AND HIGH GLUCOSE = 364 PT GIVEN 325 OF ASA PTA .

## 2018-01-06 ENCOUNTER — Telehealth: Payer: Self-pay

## 2018-01-06 LAB — CBC WITH DIFFERENTIAL/PLATELET
Abs Immature Granulocytes: 0.03 10*3/uL (ref 0.00–0.07)
Basophils Absolute: 0 10*3/uL (ref 0.0–0.1)
Basophils Relative: 0 %
Eosinophils Absolute: 0 10*3/uL (ref 0.0–0.5)
Eosinophils Relative: 0 %
HEMATOCRIT: 41.1 % (ref 36.0–46.0)
HEMOGLOBIN: 13.9 g/dL (ref 12.0–15.0)
IMMATURE GRANULOCYTES: 0 %
LYMPHS ABS: 1.6 10*3/uL (ref 0.7–4.0)
LYMPHS PCT: 22 %
MCH: 31 pg (ref 26.0–34.0)
MCHC: 33.8 g/dL (ref 30.0–36.0)
MCV: 91.7 fL (ref 80.0–100.0)
Monocytes Absolute: 0.5 10*3/uL (ref 0.1–1.0)
Monocytes Relative: 6 %
NEUTROS PCT: 72 %
Neutro Abs: 5.4 10*3/uL (ref 1.7–7.7)
Platelets: 222 10*3/uL (ref 150–400)
RBC: 4.48 MIL/uL (ref 3.87–5.11)
RDW: 12.5 % (ref 11.5–15.5)
WBC: 7.6 10*3/uL (ref 4.0–10.5)
nRBC: 0 % (ref 0.0–0.2)

## 2018-01-06 LAB — COMPREHENSIVE METABOLIC PANEL
ALBUMIN: 3.7 g/dL (ref 3.5–5.0)
ALK PHOS: 83 U/L (ref 38–126)
ALT: 29 U/L (ref 0–44)
AST: 25 U/L (ref 15–41)
Anion gap: 9 (ref 5–15)
BILIRUBIN TOTAL: 0.5 mg/dL (ref 0.3–1.2)
BUN: 20 mg/dL (ref 8–23)
CALCIUM: 9 mg/dL (ref 8.9–10.3)
CO2: 25 mmol/L (ref 22–32)
Chloride: 103 mmol/L (ref 98–111)
Creatinine, Ser: 1.16 mg/dL — ABNORMAL HIGH (ref 0.44–1.00)
GFR calc Af Amer: 58 mL/min — ABNORMAL LOW (ref >60)
GFR calc non Af Amer: 50 mL/min — ABNORMAL LOW (ref >60)
GLUCOSE: 355 mg/dL — AB (ref 70–99)
POTASSIUM: 4.3 mmol/L (ref 3.5–5.1)
Sodium: 137 mmol/L (ref 135–145)
Total Protein: 6.7 g/dL (ref 6.5–8.1)

## 2018-01-06 LAB — LIPASE, BLOOD: Lipase: 42 U/L (ref 11–51)

## 2018-01-06 LAB — TROPONIN I: Troponin I: <0.03 ng/mL (ref <0.03)

## 2018-01-06 LAB — GLUCOSE, CAPILLARY: Glucose-Capillary: 207 mg/dL — ABNORMAL HIGH (ref 70–99)

## 2018-01-06 MED ORDER — HYDROCODONE-ACETAMINOPHEN 5-325 MG PO TABS: 1.0000 | ORAL_TABLET | Freq: Four times a day (QID) | ORAL | 0 refills | Status: DC | PRN

## 2018-01-06 MED ORDER — IBUPROFEN 800 MG PO TABS: 800.0000 mg | ORAL_TABLET | Freq: Three times a day (TID) | ORAL | 0 refills | Status: DC | PRN

## 2018-01-06 NOTE — Telephone Encounter (Signed)
Tried to call pt. VM was full so I could not leave a message.

## 2018-01-06 NOTE — Discharge Instructions (Signed)
1.  You may take pain medicines as needed (Motrin/Norco #15). 2.  Apply moist heat to affected area several times daily. 3.  Return to the ER for worsening symptoms, persistent vomiting, difficulty breathing or other concerns. 

## 2018-01-06 NOTE — Telephone Encounter (Signed)
Received a fax from [facility] stating that patient called on 01/05/18 at 9:12 pm with complaint of sugar been 551, chest and back pain, chills off and on, near fainting, cramps in legs at night, stomach pains, frequent urination, difficulty breathing, and some confusion. Patient was advised to go to ER. I called this morning and spoke with patient's daughter, Judeth CornfieldStephanie (ok per DPR on file), and she said patient is resting still and she did go to ER yesterday on 01/05/18-notes in epic. Collie Siaddvised Stephanie to let patient know we were calling to check on the patient and to get her scheduled with Dr Alphonsus SiasLetvak to follow up.

## 2018-01-06 NOTE — ED Notes (Signed)
Lab notified to run blood specimens.

## 2018-01-06 NOTE — Telephone Encounter (Signed)
Please check on her and make sure her sugar is down

## 2018-01-10 NOTE — Telephone Encounter (Signed)
Spoke to pt. She said her numbers have come down. Yesterday it was 180 fasting. She will keep us updated on her sugar if it does not come down anymore.

## 2018-02-13 ENCOUNTER — Other Ambulatory Visit: Payer: Self-pay | Admitting: Pulmonary Disease

## 2018-02-13 DIAGNOSIS — R0609 Other forms of dyspnea: Principal | ICD-10-CM

## 2018-02-13 DIAGNOSIS — R05 Cough: Secondary | ICD-10-CM

## 2018-02-13 DIAGNOSIS — R079 Chest pain, unspecified: Secondary | ICD-10-CM

## 2018-02-13 DIAGNOSIS — R053 Chronic cough: Secondary | ICD-10-CM

## 2018-02-14 ENCOUNTER — Other Ambulatory Visit: Payer: Self-pay | Admitting: Pulmonary Disease

## 2018-02-14 DIAGNOSIS — R053 Chronic cough: Secondary | ICD-10-CM

## 2018-02-14 DIAGNOSIS — R0609 Other forms of dyspnea: Principal | ICD-10-CM

## 2018-02-14 DIAGNOSIS — R1310 Dysphagia, unspecified: Secondary | ICD-10-CM

## 2018-02-14 DIAGNOSIS — R05 Cough: Secondary | ICD-10-CM

## 2018-02-24 ENCOUNTER — Emergency Department: Payer: Managed Care, Other (non HMO)

## 2018-02-24 ENCOUNTER — Other Ambulatory Visit: Payer: Self-pay

## 2018-02-24 ENCOUNTER — Emergency Department
Admission: EM | Admit: 2018-02-24 | Discharge: 2018-02-24 | Disposition: A | Discharge status: Home / Self Care | Payer: Managed Care, Other (non HMO) | Attending: Emergency Medicine | Admitting: Emergency Medicine

## 2018-02-24 DIAGNOSIS — R0789 Other chest pain: Secondary | ICD-10-CM | POA: Diagnosis not present

## 2018-02-24 DIAGNOSIS — J45909 Unspecified asthma, uncomplicated: Secondary | ICD-10-CM | POA: Diagnosis not present

## 2018-02-24 DIAGNOSIS — E119 Type 2 diabetes mellitus without complications: Secondary | ICD-10-CM | POA: Insufficient documentation

## 2018-02-24 DIAGNOSIS — R079 Chest pain, unspecified: Secondary | ICD-10-CM | POA: Diagnosis present

## 2018-02-24 DIAGNOSIS — I10 Essential (primary) hypertension: Secondary | ICD-10-CM | POA: Insufficient documentation

## 2018-02-24 DIAGNOSIS — Z7982 Long term (current) use of aspirin: Secondary | ICD-10-CM | POA: Insufficient documentation

## 2018-02-24 DIAGNOSIS — Z8673 Personal history of transient ischemic attack (TIA), and cerebral infarction without residual deficits: Secondary | ICD-10-CM | POA: Diagnosis not present

## 2018-02-24 DIAGNOSIS — Z79899 Other long term (current) drug therapy: Secondary | ICD-10-CM | POA: Insufficient documentation

## 2018-02-24 DIAGNOSIS — Z7984 Long term (current) use of oral hypoglycemic drugs: Secondary | ICD-10-CM | POA: Diagnosis not present

## 2018-02-24 DIAGNOSIS — F419 Anxiety disorder, unspecified: Secondary | ICD-10-CM | POA: Insufficient documentation

## 2018-02-24 LAB — CBC
HCT: 39.7 % (ref 36.0–46.0)
Hemoglobin: 13.2 g/dL (ref 12.0–15.0)
MCH: 30.7 pg (ref 26.0–34.0)
MCHC: 33.2 g/dL (ref 30.0–36.0)
MCV: 92.3 fL (ref 80.0–100.0)
Platelets: 234 10*3/uL (ref 150–400)
RBC: 4.3 MIL/uL (ref 3.87–5.11)
RDW: 12.6 % (ref 11.5–15.5)
WBC: 5.2 10*3/uL (ref 4.0–10.5)
nRBC: 0 % (ref 0.0–0.2)

## 2018-02-24 LAB — TROPONIN I: Troponin I: <0.03 ng/mL (ref <0.03)

## 2018-02-24 LAB — BASIC METABOLIC PANEL
ANION GAP: 8 (ref 5–15)
BUN: 17 mg/dL (ref 8–23)
CALCIUM: 8.9 mg/dL (ref 8.9–10.3)
CO2: 26 mmol/L (ref 22–32)
Chloride: 105 mmol/L (ref 98–111)
Creatinine, Ser: 0.97 mg/dL (ref 0.44–1.00)
GLUCOSE: 227 mg/dL — AB (ref 70–99)
POTASSIUM: 3.8 mmol/L (ref 3.5–5.1)
SODIUM: 139 mmol/L (ref 135–145)

## 2018-02-24 MED ORDER — KETOROLAC TROMETHAMINE 30 MG/ML IJ SOLN
15.0000 mg | Freq: Once | INTRAMUSCULAR | Status: AC
  Administered 2018-02-24: 15 mg via INTRAVENOUS
  Filled 2018-02-24: qty 1

## 2018-02-24 MED ORDER — HYDROCODONE-ACETAMINOPHEN 5-325 MG PO TABS: 0.5000 | ORAL_TABLET | ORAL | 0 refills | Status: DC | PRN

## 2018-02-24 MED ORDER — HYDROCODONE-ACETAMINOPHEN 5-325 MG PO TABS
0.5000 | ORAL_TABLET | Freq: Once | ORAL | Status: AC
  Administered 2018-02-24: 0.5 via ORAL
  Filled 2018-02-24: qty 1

## 2018-02-24 MED ORDER — IOHEXOL 350 MG/ML SOLN
75.0000 mL | Freq: Once | INTRAVENOUS | Status: AC | PRN
  Administered 2018-02-24: 75 mL via INTRAVENOUS

## 2018-02-24 NOTE — ED Triage Notes (Addendum)
Pt states that she cont to have left sided chest pain, pain that radiates into her left breast and around her left side and back, no rash visible, states that the pain is a shocking sensation and it radiates. Pt also states that she is currently taking azythromycin and being followed by a pulmonologist for recent whooping cough and possible pneumonia

## 2018-02-24 NOTE — ED Notes (Signed)
Pt updated on plan for d/c, IV removed, pt currently awaits daughter for ride home.

## 2018-02-24 NOTE — ED Notes (Signed)
Pt back from ct scan, resting at this time. Reports she would like something for discomfort. MD aware. Awaiting orders at this time.

## 2018-02-24 NOTE — ED Provider Notes (Signed)
[facility] Emergency Department Provider Note  Time seen: 9:16 PM  I have reviewed the triage vital signs and the nursing notes.   HISTORY  Chief Complaint Chest Pain    HPI Mridula Pizzi Stapel is a 63 y.o. female with a past medical history of anxiety, asthma, CVA, diabetes, gastric reflux, hypertension, hyperlipidemia presents to the emergency department for left-sided chest pain.  According to the patient 2 months ago she was diagnosed with whooping cough, underwent a course of antibiotics however since that time she has had left-sided chest pain which she describes as sharp somewhat worse with movement or deep inspiration.  Patient has been seen by her primary care doctor multiple times for the same without any findings, was referred to pulmonology, patient has been following up with pulmonology but continues to have pain, states over the past 24 hours it became worse so she came to the emergency department for evaluation.  Denies any trouble breathing.  Denies any fever, does state intermittent cough but that has been an ongoing issue over the past 2 months.  No leg pain.   Past Medical History:  Diagnosis Date  . Anxiety   . Asthma   . 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 Thad Ranger  . Diabetes mellitus   . GERD (gastroesophageal reflux disease)   . Hyperlipidemia   . Hypertension     Patient Active Problem List   Diagnosis Date Noted  . Pleuritic chest pain 12/23/2017  . Acute non-recurrent frontal sinusitis 07/27/2017  . SOB (shortness of breath) 03/08/2017  . Vestibular dizziness 01/21/2016  . 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 Tick bite 07/15/2015  . Cerebrovascular disease 07/17/2014  . Preventative health care 06/25/2014  . Right sided sciatica 03/08/2013  . MENOPAUSAL SYNDROME 09/10/2008  . Mood disorder (HCC) 09/15/2007  . Essential hypertension, benign 08/10/2006  . Diabetes mellitus type 2, uncontrolled,  without complications (HCC) 06/17/2006  . HYPERCHOLESTEROLEMIA 06/17/2006  . GERD 06/17/2006    Past Surgical History:  Procedure Laterality Date  . CARDIOVASCULAR STRESS TEST  1/15   myoview. EF 60%  . CESAREAN SECTION    . ESOPHAGOGASTRODUODENOSCOPY  05/2005  . TONSILLECTOMY AND ADENOIDECTOMY      Prior to Admission medications   Medication Sig Start Date End Date Taking? Authorizing Provider  amLODipine (NORVASC) 5 MG tablet Take 5 mg by mouth 2 (two) times daily. 02/14/18 02/14/19 Yes [provider]  azithromycin (ZITHROMAX) 250 MG tablet Take 250 mg by mouth daily. 02/16/18 03/02/18 Yes [provider]  chlorpheniramine-HYDROcodone (TUSSIONEX) 10-8 MG/5ML SUER Take 5 mLs by mouth at bedtime as needed. 09/08/17  Yes [provider]  aspirin EC 81 MG tablet Take 81 mg by mouth daily.    [provider]  atorvastatin (LIPITOR) 40 MG tablet Take 1 tablet (40 mg total) by mouth daily. 04/08/17   Karie Schwalbe, MD  benzonatate (TESSALON PERLES) 100 MG capsule Take 2 capsules (200 mg total) by mouth 3 (three) times daily as needed. 09/08/17 09/08/18  Joni Reining, PA-C  cefUROXime (CEFTIN) 250 MG tablet Take 1 tablet (250 mg total) by mouth 2 (two) times daily with a meal. 12/23/17   Karie Schwalbe, MD  cephALEXin (KEFLEX) 500 MG capsule Take 2 capsules (1,000 mg total) by mouth 2 (two) times daily. 08/03/17   Copland, Karleen Hampshire, MD  citalopram (CELEXA) 20 MG tablet Take 1 tablet (20 mg total) by mouth daily. 04/08/17   Tillman Abide  I, MD  glipiZIDE (GLUCOTROL XL) 5 MG 24 hr tablet Take 1 tablet (5 mg total) by mouth daily with breakfast. 07/27/17   Tillman AbideLetvak, Richard I, MD  glucose blood (ONE TOUCH ULTRA TEST) test strip Use to check blood sugar once a day 11/24/17   Karie SchwalbeLetvak, Richard I, MD  HYDROcodone-acetaminophen (NORCO) 5-325 MG tablet Take 1 tablet by mouth every 6 (six) hours as needed for moderate pain. 01/06/18   Irean HongSung, Jade J, MD   HYDROcodone-homatropine [facility](HYCODAN) 5-1.5 MG/5ML syrup Take 5 mLs by mouth at bedtime as needed for cough. 12/23/17   Karie SchwalbeLetvak, Richard I, MD  ibuprofen (ADVIL,MOTRIN) 800 MG tablet Take 1 tablet (800 mg total) by mouth every 8 (eight) hours as needed for moderate pain. 01/06/18   Irean HongSung, Jade J, MD  lisinopril (PRINIVIL,ZESTRIL) 20 MG tablet TAKE 1 TABLET BY MOUTH DAILY 07/12/17   Tillman AbideLetvak, Richard I, MD  metoprolol succinate (TOPROL-XL) 100 MG 24 hr tablet TAKE 1 TABLET BY MOUTH WITH FOOD 07/18/17   Tillman AbideLetvak, Richard I, MD  nystatin (MYCOSTATIN/NYSTOP) powder Apply topically 2 (two) times daily. 04/08/17   Karie SchwalbeLetvak, Richard I, MD  omeprazole (PRILOSEC) 20 MG capsule Take 1 capsule (20 mg total) by mouth daily. 03/08/17   Karie SchwalbeLetvak, Richard I, MD  OVER THE COUNTER MEDICATION Place 1 drop into both eyes daily as needed (dry eyes). Over the counter lubricating eye drop    [provider]  predniSONE (DELTASONE) 20 MG tablet Take 2 tablets (40 mg total) by mouth daily. 12/23/17   Karie SchwalbeLetvak, Richard I, MD    Allergies  Allergen Reactions  . Citalopram Other (See Comments)    Feels odd  . Doxycycline Hyclate Nausea And Vomiting  . Erythromycin Other (See Comments)    Irritated stomach  . Montelukast Sodium Itching  . Nitrofurantoin Nausea And Vomiting  . Sulfa Antibiotics Nausea And Vomiting  . Tramadol Hcl Other (See Comments)    Feels odd  . Venlafaxine Hcl Other (See Comments)    Feels odd  . Amoxicillin-Pot Clavulanate Nausea And Vomiting  . Clarithromycin Rash     See ER note 02/2017    Family History  Problem Relation Age of Onset  . Coronary artery disease Mother   . Diabetes Mellitus II Mother   . Heart attack Mother [id number]  . Diabetes Mellitus II Maternal Grandmother     Social History Social History   Tobacco Use  . Smoking status: Never Smoker  . Smokeless tobacco: Never Used  Substance Use Topics  . Alcohol use: No    Alcohol/week: 0.0 standard drinks    Comment: wine occassionally   . Drug use: No    Review of Systems Constitutional: Negative for fever. Cardiovascular: Left-sided chest pain worse with movement or deep inspiration. Respiratory: Negative for shortness of breath. Gastrointestinal: Negative for abdominal pain, vomiting  Musculoskeletal: Negative for leg pain or swelling. Skin: No rash.  Also at one point that it could be shingles but no rash ever developed. Neurological: Negative for headache All other ROS negative  ____________________________________________   PHYSICAL EXAM:  VITAL SIGNS: ED Triage Vitals  Enc Vitals Group     BP 02/24/18 1823 (!) 201/80     Pulse Rate 02/24/18 1823 79     Resp 02/24/18 1823 18     Temp 02/24/18 1823 98 F (36.7 C)     Temp Source 02/24/18 1823 Oral     SpO2 02/24/18 1823 98 %     Weight 02/24/18 1824 233 lb (105.7  kg)     Height 02/24/18 1824 5\' 3"  (1.6 m)     Head Circumference --      Peak Flow --      Pain Score 02/24/18 1823 9     Pain Loc --      Pain Edu? --      Excl. in GC? --    Constitutional: Alert and oriented. Well appearing and in no distress. Eyes: Normal exam ENT   Head: Normocephalic and atraumatic   Mouth/Throat: Mucous membranes are moist. Cardiovascular: Normal rate, regular rhythm. Respiratory: Normal respiratory effort without tachypnea nor retractions. Breath sounds are clear.  Reproducible left chest wall tenderness to palpation Gastrointestinal: Soft and nontender. No distention.  Musculoskeletal: Nontender with normal range of motion in all extremities. No lower extremity tenderness  Neurologic:  Normal speech and language. No gross focal neurologic deficits Skin:  Skin is warm, dry and intact.  Psychiatric: Mood and affect are normal.   ____________________________________________    EKG  EKG viewed and interpreted by myself shows a normal sinus rhythm at 78 bpm with a narrow QRS, normal axis, normal intervals, no significant or concerning ST changes  present.  ____________________________________________    RADIOLOGY  Chest x-ray negative  Scan of the chest is negative.  ____________________________________________   INITIAL IMPRESSION / ASSESSMENT AND PLAN / ED COURSE  Pertinent labs & imaging results that were available during my care of the patient were reviewed by me and considered in my medical decision making (see chart for details).  Patient presents to the emergency department for chest pain ongoing for the past 6 to 8 weeks in the left chest worse with movement or deep inspiration.  The pain is reproducible on examination.  Patient's work-up is reassuring including a negative troponin, normal labs and a normal chest x-ray and a fairly normal EKG.  However given the patient's ongoing symptoms we will proceed with CT imaging of the chest to help rule out intrathoracic abnormality or pulmonary embolism.  Patient agreeable to plan of care.  CT scan of the chest is negative.  Labs are reassuring.  Troponin is negative.  Patient states her pain is improved after Toradol and half a tablet of Norco.  Is not entirely clear cause of the patient's pain she has no rash to this area although I suppose an atypical HSV or postherpetic neuralgia could explain her symptoms.  I discussed with the patient a short course of pain medication going home and following up with her doctor.  Patient agreeable to plan.  ____________________________________________   FINAL CLINICAL IMPRESSION(S) / ED DIAGNOSES  Left chest wall pain   Minna AntisPaduchowski, Antonina Deziel, MD 02/24/18 2236

## 2018-02-24 NOTE — ED Notes (Signed)
PT VERBALIZED UNDERSTANDING OF D/C INSTRUCTIONS AND F/U CARE NO FURTHER QUESTIONS AT THIS TIME. WHEELED TO LOBBY TO MEET DAUGHTER.

## 2018-02-24 NOTE — ED Notes (Signed)
Patient transported to CT 

## 2018-03-01 ENCOUNTER — Telehealth: Payer: Self-pay

## 2018-03-01 NOTE — Telephone Encounter (Signed)
Tried to call pt to see how she was doing after recent ER visit. Her VM was full so I could not leave a message.

## 2018-04-13 ENCOUNTER — Emergency Department
Admission: EM | Admit: 2018-04-13 | Discharge: 2018-04-14 | Disposition: A | Discharge status: Home / Self Care | Payer: Managed Care, Other (non HMO) | Attending: Emergency Medicine | Admitting: Emergency Medicine

## 2018-04-13 ENCOUNTER — Other Ambulatory Visit: Payer: Self-pay

## 2018-04-13 ENCOUNTER — Emergency Department: Payer: Managed Care, Other (non HMO)

## 2018-04-13 ENCOUNTER — Encounter: Payer: Self-pay | Admitting: Emergency Medicine

## 2018-04-13 DIAGNOSIS — Z7982 Long term (current) use of aspirin: Secondary | ICD-10-CM | POA: Diagnosis not present

## 2018-04-13 DIAGNOSIS — R0609 Other forms of dyspnea: Secondary | ICD-10-CM | POA: Diagnosis present

## 2018-04-13 DIAGNOSIS — J45909 Unspecified asthma, uncomplicated: Secondary | ICD-10-CM | POA: Insufficient documentation

## 2018-04-13 DIAGNOSIS — I10 Essential (primary) hypertension: Secondary | ICD-10-CM | POA: Insufficient documentation

## 2018-04-13 DIAGNOSIS — Z7984 Long term (current) use of oral hypoglycemic drugs: Secondary | ICD-10-CM | POA: Diagnosis not present

## 2018-04-13 DIAGNOSIS — Z8673 Personal history of transient ischemic attack (TIA), and cerebral infarction without residual deficits: Secondary | ICD-10-CM | POA: Insufficient documentation

## 2018-04-13 DIAGNOSIS — E119 Type 2 diabetes mellitus without complications: Secondary | ICD-10-CM | POA: Insufficient documentation

## 2018-04-13 DIAGNOSIS — Z79899 Other long term (current) drug therapy: Secondary | ICD-10-CM | POA: Insufficient documentation

## 2018-04-13 LAB — CBC
HCT: 40.2 % (ref 36.0–46.0)
Hemoglobin: 13.5 g/dL (ref 12.0–15.0)
MCH: 30.6 pg (ref 26.0–34.0)
MCHC: 33.6 g/dL (ref 30.0–36.0)
MCV: 91.2 fL (ref 80.0–100.0)
NRBC: 0 % (ref 0.0–0.2)
Platelets: 218 10*3/uL (ref 150–400)
RBC: 4.41 MIL/uL (ref 3.87–5.11)
RDW: 12.1 % (ref 11.5–15.5)
WBC: 5.7 10*3/uL (ref 4.0–10.5)

## 2018-04-13 LAB — TROPONIN I: Troponin I: <0.03 ng/mL (ref <0.03)

## 2018-04-13 LAB — BASIC METABOLIC PANEL
Anion gap: 6 (ref 5–15)
BUN: 18 mg/dL (ref 8–23)
CHLORIDE: 104 mmol/L (ref 98–111)
CO2: 26 mmol/L (ref 22–32)
Calcium: 8.9 mg/dL (ref 8.9–10.3)
Creatinine, Ser: 1.03 mg/dL — ABNORMAL HIGH (ref 0.44–1.00)
GFR calc Af Amer: >60 mL/min (ref >60)
GFR calc non Af Amer: 59 mL/min — ABNORMAL LOW (ref >60)
Glucose, Bld: 347 mg/dL — ABNORMAL HIGH (ref 70–99)
Potassium: 4.2 mmol/L (ref 3.5–5.1)
Sodium: 136 mmol/L (ref 135–145)

## 2018-04-13 LAB — GLUCOSE, CAPILLARY: GLUCOSE-CAPILLARY: 196 mg/dL — AB (ref 70–99)

## 2018-04-13 LAB — BRAIN NATRIURETIC PEPTIDE: B Natriuretic Peptide: 11 pg/mL (ref 0.0–100.0)

## 2018-04-13 MED ORDER — AMLODIPINE BESYLATE 5 MG PO TABS
5.0000 mg | ORAL_TABLET | Freq: Once | ORAL | Status: AC
  Administered 2018-04-13: 5 mg via ORAL
  Filled 2018-04-13: qty 1

## 2018-04-13 NOTE — ED Triage Notes (Signed)
First Nurse Note:  Patient presents to the ED from [facility] for hypertension and hypoxia.  Per [facility] staff, patient's BP was 219/99 and her oxygen level was 86% on room air when she was walking.  Her oxygen saturation is now 94% on 2L of O2.

## 2018-04-13 NOTE — ED Triage Notes (Signed)
Pt in via POV, sent over from [facility] Pulmonology due to hypoxia on ambulation, reports saturation 86%, arrives on 2L nasal cannula.  Pt reports worsening shortness of breath over the last two days.  Denies any cough, fever.  Hypertensive upon arrival, reports compliance with BP medication.    Pt taken off of oxygen to receive room air saturation; WDL at this time.

## 2018-04-13 NOTE — Discharge Instructions (Signed)
Your workup in the Emergency Department today was reassuring.  We did not find any specific abnormalities.  Specifically, as your doctor should be able to see in the computer, you had an EKG without any evidence of ischemia, a clear chest x-ray, and lab work that was all within normal limits including a BNP and troponin.  Although your blood pressure is elevated, it was consistently elevated and may have been elevated for days.  We do not want to dramatically or drastically lowered quickly, so I gave you a dose of amlodipine 5 mg which you have been prescribed.  I recommend you continue taking your lisinopril, metoprolol, and the amlodipine previously prescribed and discussed all of this with your doctor.    Return to the Emergency Department if you develop new or worsening symptoms that concern you.

## 2018-04-13 NOTE — ED Provider Notes (Signed)
[facility] Emergency Department Provider Note  ____________________________________________   First MD Initiated Contact with Patient 04/13/18 2303     (approximate)  I have reviewed the triage vital signs and the nursing notes.   HISTORY  Chief Complaint Hypoxia    HPI Tami Jones is a 63 y.o. female with medical history as listed below who is established with Dr. Meredeth Ide in 1 of his colleagues with pulmonology.  She presents from [facility] clinic pulmonology for evaluation of hypoxemia with exertion.  She reports that she has had symptoms like this for an extended period of time but it is gotten noticeably worse over the last few days.  Her PCP, Dr. Alphonsus Sias, has had difficulty controlling her hypertension as well as identifying a cause of her persistent difficulty breathing and dyspnea on exertion.  She was sent to the pulmonologist a few months ago.  They did some testing and found that she was positive for pertussis.  She and her daughter report that this was 2 to 3 months ago.  She completed treatment and has gotten better from that, but over the last few days, possibly week, she has started to get worse again in terms of the shortness of breath.  She is working at [employer] and states  that when she walks upstairs or down the long hallways she gets out of breath.  She sometimes has aching chest pain as well.  She often feels like she has a little bit of a sore throat or like it is difficulty swallowing although she never gets choked on her food or on liquids.  She denies fever/chills, nasal congestion/runny nose, nausea, vomiting, abdominal pain, and dysuria.  Exertion makes his symptoms worse and nothing in particular makes them better or other than rest.  Because of the symptoms that have been worsening, she scheduled an appointment with the pulmonologist today.  When she went to the clinic, they discovered that her blood pressure was high, in the range of 200  systolic over 100 diastolic.  Although she was not hypoxemic at rest, the ambulated around the hallway and she dropped to about 86%.  At that point they sent her to the emergency department.  Of note, she reports that she takes metoprolol and lisinopril and she has been prescribed amlodipine twice daily (5 mg) but she has not started taking it yet.  She was told that she is going to transition to it.     Past Medical History:  Diagnosis Date  . Anxiety   . Asthma   . 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 Thad Ranger  . Diabetes mellitus   . GERD (gastroesophageal reflux disease)   . Hyperlipidemia   . Hypertension     Patient Active Problem List   Diagnosis Date Noted  . Pleuritic chest pain 12/23/2017  . Acute non-recurrent frontal sinusitis 07/27/2017  . SOB (shortness of breath) 03/08/2017  . Vestibular dizziness 01/21/2016  . 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 Tick bite 07/15/2015  . Cerebrovascular disease 07/17/2014  . Preventative health care 06/25/2014  . Right sided sciatica 03/08/2013  . MENOPAUSAL SYNDROME 09/10/2008  . Mood disorder (HCC) 09/15/2007  . Essential hypertension, benign 08/10/2006  . Diabetes mellitus type 2, uncontrolled, without complications (HCC) 06/17/2006  . HYPERCHOLESTEROLEMIA 06/17/2006  . GERD 06/17/2006    Past Surgical History:  Procedure Laterality Date  . CARDIOVASCULAR STRESS TEST  1/15   myoview. EF 60%  . CESAREAN SECTION    .  ESOPHAGOGASTRODUODENOSCOPY  05/2005  . TONSILLECTOMY AND ADENOIDECTOMY      Prior to Admission medications   Medication Sig Start Date End Date Taking? Authorizing Provider  amLODipine (NORVASC) 5 MG tablet Take 5 mg by mouth 2 (two) times daily. 02/14/18 02/14/19  [provider]  aspirin EC 81 MG tablet Take 81 mg by mouth daily.    [provider]  atorvastatin (LIPITOR) 40 MG tablet Take 1 tablet (40 mg total) by mouth daily. 04/08/17   Karie Schwalbe, MD  benzonatate (TESSALON PERLES) 100 MG capsule Take 2 capsules (200 mg total) by mouth 3 (three) times daily as needed. 09/08/17 09/08/18  Joni Reining, PA-C  cefUROXime (CEFTIN) 250 MG tablet Take 1 tablet (250 mg total) by mouth 2 (two) times daily with a meal. 12/23/17   Karie Schwalbe, MD  cephALEXin (KEFLEX) 500 MG capsule Take 2 capsules (1,000 mg total) by mouth 2 (two) times daily. 08/03/17   Copland, Karleen Hampshire, MD  chlorpheniramine-HYDROcodone (TUSSIONEX) 10-8 MG/5ML SUER Take 5 mLs by mouth at bedtime as needed. 09/08/17   [provider]  citalopram (CELEXA) 20 MG tablet Take 1 tablet (20 mg total) by mouth daily. 04/08/17   Karie Schwalbe, MD  glipiZIDE (GLUCOTROL XL) 5 MG 24 hr tablet Take 1 tablet (5 mg total) by mouth daily with breakfast. 07/27/17   Tillman Abide I, MD  glucose blood (ONE TOUCH ULTRA TEST) test strip Use to check blood sugar once a day 11/24/17   Karie Schwalbe, MD  HYDROcodone-acetaminophen (NORCO/VICODIN) 5-325 MG tablet Take 0.5 tablets by mouth every 4 (four) hours as needed for moderate pain. 02/24/18   Minna Antis, MD  HYDROcodone-homatropine (HYCODAN) 5-1.5 MG/5ML syrup Take 5 mLs by mouth at bedtime as needed for cough. 12/23/17   Karie Schwalbe, MD  ibuprofen (ADVIL,MOTRIN) 800 MG tablet Take 1 tablet (800 mg total) by mouth every 8 (eight) hours as needed for moderate pain. 01/06/18   Irean Hong, MD  lisinopril (PRINIVIL,ZESTRIL) 20 MG tablet TAKE 1 TABLET BY MOUTH DAILY 07/12/17   Tillman Abide I, MD  metoprolol succinate (TOPROL-XL) 100 MG 24 hr tablet TAKE 1 TABLET BY MOUTH WITH FOOD 07/18/17   Tillman Abide I, MD  nystatin (MYCOSTATIN/NYSTOP) powder Apply topically 2 (two) times daily. 04/08/17   Karie Schwalbe, MD  omeprazole (PRILOSEC) 20 MG capsule Take 1 capsule (20 mg total) by mouth daily. 03/08/17   Karie Schwalbe, MD  OVER THE COUNTER MEDICATION Place 1 drop into both eyes daily as needed (dry eyes). Over  the counter lubricating eye drop    [provider]  predniSONE (DELTASONE) 20 MG tablet Take 2 tablets (40 mg total) by mouth daily. 12/23/17   Karie Schwalbe, MD    Allergies Citalopram; Doxycycline hyclate; Erythromycin; Montelukast sodium; Nitrofurantoin; Sulfa antibiotics; Tramadol hcl; Venlafaxine hcl; Amoxicillin-pot clavulanate; and Clarithromycin  Family History  Problem Relation Age of Onset  . Coronary artery disease Mother   . Diabetes Mellitus II Mother   . Heart attack Mother 50  . Diabetes Mellitus II Maternal Grandmother     Social History Social History   Tobacco Use  . Smoking status: Never Smoker  . Smokeless tobacco: Never Used  Substance Use Topics  . Alcohol use: No    Alcohol/week: 0.0 standard drinks    Comment: wine occassionally  . Drug use: No    Review of Systems Constitutional: No fever/chills Eyes: No visual changes. ENT: No sore  throat. Cardiovascular: occasional chest pain. Respiratory: SOB with exertion Gastrointestinal: No abdominal pain.  No nausea, no vomiting.  No diarrhea.  No constipation. Genitourinary: Negative for dysuria. Musculoskeletal: Negative for neck pain.  Negative for back pain. Integumentary: Negative for rash. Neurological: Negative for headaches, focal weakness or numbness.   ____________________________________________   PHYSICAL EXAM:  VITAL SIGNS: ED Triage Vitals  Enc Vitals Group     BP 04/13/18 1522 (!) 206/87     Pulse Rate 04/13/18 1522 77     Resp 04/13/18 1522 20     Temp 04/13/18 1522 97.8 F (36.6 C)     Temp Source 04/13/18 1522 Oral     SpO2 04/13/18 1522 97 %     Weight 04/13/18 1523 105.7 kg (233 lb)     Height 04/13/18 1523 1.6 m (5\' 3" )     Head Circumference --      Peak Flow --      Pain Score 04/13/18 1522 7     Pain Loc --      Pain Edu? --      Excl. in GC? --     Constitutional: Alert and oriented. Well appearing and in no acute distress. Eyes: Conjunctivae are  normal.  Head: Atraumatic. Nose: No congestion/rhinnorhea. Mouth/Throat: Mucous membranes are moist. Neck: No stridor.  No meningeal signs.   Cardiovascular: Normal rate, regular rhythm. Good peripheral circulation. Grossly normal heart sounds. Respiratory: Normal respiratory effort.  No retractions. Lungs CTAB. Gastrointestinal: Soft and nontender. No distention.  Musculoskeletal: No lower extremity tenderness nor edema. No gross deformities of extremities. Neurologic:  Normal speech and language. No gross focal neurologic deficits are appreciated.  Skin:  Skin is warm, dry and intact. No rash noted. Psychiatric: Mood and affect are normal. Speech and behavior are normal.  ____________________________________________   LABS (all labs ordered are listed, but only abnormal results are displayed)  Labs Reviewed  BASIC METABOLIC PANEL - Abnormal; Notable for the following components:      Result Value   Glucose, Bld 347 (*)    Creatinine, Ser 1.03 (*)    GFR calc non Af Amer 59 (*)    All other components within normal limits  GLUCOSE, CAPILLARY - Abnormal; Notable for the following components:   Glucose-Capillary 196 (*)    All other components within normal limits  CBC  TROPONIN I  BRAIN NATRIURETIC PEPTIDE   ____________________________________________  EKG  ED ECG REPORT I, Loleta Rose, the attending physician, personally viewed and interpreted this ECG.  Date: 04/13/2018 EKG Time: 15:15 Rate: 72 Rhythm: normal sinus rhythm QRS Axis: normal Intervals: normal ST/T Wave abnormalities: Non-specific ST segment / T-wave changes, but no clear evidence of acute ischemia.  Inverted T-waves in lead III. Narrative Interpretation: no definitive evidence of acute ischemia; does not meet STEMI criteria.   ____________________________________________  RADIOLOGY I, Loleta Rose, personally viewed and evaluated these images (plain radiographs) as part of my medical decision  making, as well as reviewing the written report by the radiologist.  ED MD interpretation:  No evidence of acute abnormalities on CXR.  Official radiology report(s): Dg Chest 2 View  Result Date: 04/13/2018 CLINICAL DATA:  Hypoxia. EXAM: CHEST - 2 VIEW COMPARISON:  Chest x-rays dated 02/24/2018 and 01/05/2018. FINDINGS: Cardiomediastinal silhouette is within normal limits in size and configuration. Lungs are clear. Lung volumes are normal. No evidence of pneumonia. No pleural effusion. No pneumothorax seen. Osseous structures about the chest are unremarkable. Stable kyphosis of the thoracic spine.  IMPRESSION: No active cardiopulmonary disease. No evidence of pneumonia or pulmonary edema. Electronically Signed   By: Bary Richard M.D.   On: 04/13/2018 16:19    ____________________________________________   PROCEDURES   Procedure(s) performed (including Critical Care):  Procedures   ____________________________________________   INITIAL IMPRESSION / MDM / ASSESSMENT AND PLAN / ED COURSE  As part of my medical decision making, I reviewed the following data within the electronic MEDICAL RECORD NUMBER Nursing notes reviewed and incorporated, Labs reviewed , EKG interpreted , Old chart reviewed, Radiograph reviewed  and Notes from prior ED visits       Differential diagnosis includes, but is not limited to, undiagnosed heart failure, uncontrolled hypertension, ACS, PE, pneumonia, pneumothorax.  The patient's vital signs are stable with a room air saturation of 99 to 100% and no tachycardia.  She has some tachypnea but only after ambulating.  She is persistently hypertensive in the emergency department after an 8-hour wait for a room.  However her most recent blood pressure was in the 160s systolic.  Her work-up was reassuring.  She has no evidence of ischemia on her EKG, no evidence of acute abnormality on chest x-ray, and other than hyperglycemia, her troponin, CBC, basic metabolic panel, and  BNP are all within normal limits.  She has no risk factors for pulmonary embolism with a Wells score for PE of 0 and she has some dyspnea on exertion but otherwise has no abnormal vital signs that would suggest PE.  I had a long talk with her and her daughter.  I explained that from an emergency medicine perspective she is cleared although I did offer admission for blood pressure control and for the hypoxemia with exertion.  However I explained I did not think it was absolutely necessary and that now that she has been cleared from the ED, she can follow-up tomorrow with the pulmonologist (she already has a follow-up appointment).  She does not want to stay in the hospital and wants to follow-up as an outpatient and I think that is appropriate.  She did not just recently developed the symptoms and there is no evidence based on my physical exam and evaluation that she has an acute or emergent condition at this time.  She and her daughter will follow-up as scheduled tomorrow and I gave my usual customary return precautions if she develops any new or worsening symptoms.     ____________________________________________  FINAL CLINICAL IMPRESSION(S) / ED DIAGNOSES  Final diagnoses:  Dyspnea on exertion  Essential hypertension     MEDICATIONS GIVEN DURING THIS VISIT:  Medications  amLODipine (NORVASC) tablet 5 mg (5 mg Oral Given 04/13/18 2335)     ED Discharge Orders    None       Note:  This document was prepared using Dragon voice recognition software and may include unintentional dictation errors.   Loleta Rose, MD 04/13/18 [phone]

## 2018-04-19 ENCOUNTER — Telehealth: Payer: Self-pay

## 2018-04-19 NOTE — Telephone Encounter (Signed)
Tried to call pt to see how she was doing after going to the ER. Her VM is full and I could not leave a message.

## 2018-05-19 ENCOUNTER — Telehealth: Payer: Self-pay | Admitting: Internal Medicine

## 2018-05-19 DIAGNOSIS — Z0279 Encounter for issue of other medical certificate: Secondary | ICD-10-CM

## 2018-05-19 NOTE — Telephone Encounter (Signed)
FMLA paperwork in dr Alphonsus Sias in box for review and signature

## 2018-05-19 NOTE — Telephone Encounter (Signed)
Form signed. Thanks!

## 2018-05-23 NOTE — Telephone Encounter (Signed)
Tried calling pt mail box full Paperwork faxed 4/6 Copy for pt Copy for scan Copy for billing

## 2018-08-03 ENCOUNTER — Other Ambulatory Visit: Payer: Self-pay

## 2018-08-03 ENCOUNTER — Encounter: Payer: Self-pay | Admitting: Emergency Medicine

## 2018-08-03 ENCOUNTER — Inpatient Hospital Stay
Admission: EM | Admit: 2018-08-03 | Discharge: 2018-08-05 | DRG: 065 | Disposition: A | Discharge status: Home Health | Payer: Managed Care, Other (non HMO) | Attending: Internal Medicine | Admitting: Internal Medicine

## 2018-08-03 ENCOUNTER — Telehealth: Payer: Self-pay

## 2018-08-03 ENCOUNTER — Emergency Department: Payer: Managed Care, Other (non HMO)

## 2018-08-03 DIAGNOSIS — Z6841 Body Mass Index (BMI) 40.0 and over, adult: Secondary | ICD-10-CM

## 2018-08-03 DIAGNOSIS — Z7982 Long term (current) use of aspirin: Secondary | ICD-10-CM

## 2018-08-03 DIAGNOSIS — Z8249 Family history of ischemic heart disease and other diseases of the circulatory system: Secondary | ICD-10-CM

## 2018-08-03 DIAGNOSIS — Z7984 Long term (current) use of oral hypoglycemic drugs: Secondary | ICD-10-CM

## 2018-08-03 DIAGNOSIS — I6381 Other cerebral infarction due to occlusion or stenosis of small artery: Secondary | ICD-10-CM | POA: Diagnosis not present

## 2018-08-03 DIAGNOSIS — E785 Hyperlipidemia, unspecified: Secondary | ICD-10-CM | POA: Diagnosis present

## 2018-08-03 DIAGNOSIS — E119 Type 2 diabetes mellitus without complications: Secondary | ICD-10-CM | POA: Diagnosis present

## 2018-08-03 DIAGNOSIS — H547 Unspecified visual loss: Secondary | ICD-10-CM | POA: Diagnosis present

## 2018-08-03 DIAGNOSIS — G8321 Monoplegia of upper limb affecting right dominant side: Secondary | ICD-10-CM | POA: Diagnosis present

## 2018-08-03 DIAGNOSIS — I10 Essential (primary) hypertension: Secondary | ICD-10-CM | POA: Diagnosis present

## 2018-08-03 DIAGNOSIS — Z833 Family history of diabetes mellitus: Secondary | ICD-10-CM

## 2018-08-03 DIAGNOSIS — Z1159 Encounter for screening for other viral diseases: Secondary | ICD-10-CM

## 2018-08-03 DIAGNOSIS — I651 Occlusion and stenosis of basilar artery: Secondary | ICD-10-CM | POA: Diagnosis present

## 2018-08-03 DIAGNOSIS — N390 Urinary tract infection, site not specified: Secondary | ICD-10-CM | POA: Diagnosis present

## 2018-08-03 DIAGNOSIS — I69998 Other sequelae following unspecified cerebrovascular disease: Secondary | ICD-10-CM

## 2018-08-03 DIAGNOSIS — R29701 NIHSS score 1: Secondary | ICD-10-CM | POA: Diagnosis present

## 2018-08-03 DIAGNOSIS — I639 Cerebral infarction, unspecified: Secondary | ICD-10-CM

## 2018-08-03 LAB — URINALYSIS, COMPLETE (UACMP) WITH MICROSCOPIC
Bilirubin Urine: NEGATIVE
Glucose, UA: >=500 mg/dL — AB
Hgb urine dipstick: NEGATIVE
Ketones, ur: NEGATIVE mg/dL
Nitrite: NEGATIVE
Protein, ur: NEGATIVE mg/dL
Specific Gravity, Urine: 1.014 (ref 1.005–1.030)
pH: 6 (ref 5.0–8.0)

## 2018-08-03 LAB — COMPREHENSIVE METABOLIC PANEL
ALT: 31 U/L (ref 0–44)
AST: 25 U/L (ref 15–41)
Albumin: 4.3 g/dL (ref 3.5–5.0)
Alkaline Phosphatase: 104 U/L (ref 38–126)
Anion gap: 11 (ref 5–15)
BUN: 18 mg/dL (ref 8–23)
CO2: 26 mmol/L (ref 22–32)
Calcium: 9.4 mg/dL (ref 8.9–10.3)
Chloride: 101 mmol/L (ref 98–111)
Creatinine, Ser: 1.01 mg/dL — ABNORMAL HIGH (ref 0.44–1.00)
GFR calc Af Amer: >60 mL/min (ref >60)
GFR calc non Af Amer: >60 mL/min (ref >60)
Glucose, Bld: 213 mg/dL — ABNORMAL HIGH (ref 70–99)
Potassium: 3.8 mmol/L (ref 3.5–5.1)
Sodium: 138 mmol/L (ref 135–145)
Total Bilirubin: 0.5 mg/dL (ref 0.3–1.2)
Total Protein: 7.6 g/dL (ref 6.5–8.1)

## 2018-08-03 LAB — CBC
HCT: 41.9 % (ref 36.0–46.0)
Hemoglobin: 14.1 g/dL (ref 12.0–15.0)
MCH: 30.6 pg (ref 26.0–34.0)
MCHC: 33.7 g/dL (ref 30.0–36.0)
MCV: 90.9 fL (ref 80.0–100.0)
Platelets: 224 10*3/uL (ref 150–400)
RBC: 4.61 MIL/uL (ref 3.87–5.11)
RDW: 12.2 % (ref 11.5–15.5)
WBC: 6 10*3/uL (ref 4.0–10.5)
nRBC: 0 % (ref 0.0–0.2)

## 2018-08-03 LAB — CBC WITH DIFFERENTIAL/PLATELET
Abs Immature Granulocytes: 0.01 10*3/uL (ref 0.00–0.07)
Basophils Absolute: 0 10*3/uL (ref 0.0–0.1)
Basophils Relative: 0 %
Eosinophils Absolute: 0 10*3/uL (ref 0.0–0.5)
Eosinophils Relative: 1 %
HCT: 42.2 % (ref 36.0–46.0)
Hemoglobin: 14.1 g/dL (ref 12.0–15.0)
Immature Granulocytes: 0 %
Lymphocytes Relative: 33 %
Lymphs Abs: 1.9 10*3/uL (ref 0.7–4.0)
MCH: 30.7 pg (ref 26.0–34.0)
MCHC: 33.4 g/dL (ref 30.0–36.0)
MCV: 91.7 fL (ref 80.0–100.0)
Monocytes Absolute: 0.3 10*3/uL (ref 0.1–1.0)
Monocytes Relative: 4 %
Neutro Abs: 3.7 10*3/uL (ref 1.7–7.7)
Neutrophils Relative %: 62 %
Platelets: 248 10*3/uL (ref 150–400)
RBC: 4.6 MIL/uL (ref 3.87–5.11)
RDW: 12.3 % (ref 11.5–15.5)
WBC: 5.9 10*3/uL (ref 4.0–10.5)
nRBC: 0 % (ref 0.0–0.2)

## 2018-08-03 LAB — PROTIME-INR
INR: 0.9 (ref 0.8–1.2)
Prothrombin Time: 11.8 seconds (ref 11.4–15.2)

## 2018-08-03 LAB — URINE DRUG SCREEN, QUALITATIVE (ARMC ONLY)
Amphetamines, Ur Screen: NOT DETECTED
Barbiturates, Ur Screen: NOT DETECTED
Benzodiazepine, Ur Scrn: NOT DETECTED
Cannabinoid 50 Ng, Ur ~~LOC~~: NOT DETECTED
Cocaine Metabolite,Ur ~~LOC~~: NOT DETECTED
MDMA (Ecstasy)Ur Screen: NOT DETECTED
Methadone Scn, Ur: NOT DETECTED
Opiate, Ur Screen: NOT DETECTED
Phencyclidine (PCP) Ur S: NOT DETECTED
Tricyclic, Ur Screen: NOT DETECTED

## 2018-08-03 LAB — BASIC METABOLIC PANEL
Anion gap: 11 (ref 5–15)
BUN: 16 mg/dL (ref 8–23)
CO2: 25 mmol/L (ref 22–32)
Calcium: 9.3 mg/dL (ref 8.9–10.3)
Chloride: 102 mmol/L (ref 98–111)
Creatinine, Ser: 1.06 mg/dL — ABNORMAL HIGH (ref 0.44–1.00)
GFR calc Af Amer: >60 mL/min (ref >60)
GFR calc non Af Amer: 57 mL/min — ABNORMAL LOW (ref >60)
Glucose, Bld: 264 mg/dL — ABNORMAL HIGH (ref 70–99)
Potassium: 3.6 mmol/L (ref 3.5–5.1)
Sodium: 138 mmol/L (ref 135–145)

## 2018-08-03 LAB — ETHANOL: Alcohol, Ethyl (B): <10 mg/dL (ref <10)

## 2018-08-03 LAB — APTT: aPTT: 28 seconds (ref 24–36)

## 2018-08-03 MED ORDER — SODIUM CHLORIDE 0.9 % IV SOLN: 1.0000 g | INTRAVENOUS | Status: DC

## 2018-08-03 MED ORDER — ASPIRIN 81 MG PO CHEW
324.0000 mg | CHEWABLE_TABLET | Freq: Once | ORAL | Status: AC
  Administered 2018-08-03: 324 mg via ORAL
  Filled 2018-08-03: qty 4

## 2018-08-03 MED ORDER — SODIUM CHLORIDE 0.9 % IV SOLN
1.0000 g | Freq: Once | INTRAVENOUS | Status: AC
  Administered 2018-08-03: 1 g via INTRAVENOUS
  Filled 2018-08-03: qty 10

## 2018-08-03 NOTE — Telephone Encounter (Signed)
Not there now Follow up tomorrow to make sure she has been evaluated

## 2018-08-03 NOTE — ED Provider Notes (Addendum)
[facility] EMERGENCY DEPARTMENT Provider Note   CSN: [id number] Arrival date & time: 08/03/18  1243    History   Chief Complaint Chief Complaint  Patient presents with   Numbness    right hand    Weakness    HPI Tami Jones is a 63 y.o. female.     HPI   63 yo F with HTN, hyperlipidemia, diabetes, here with right side arm numbness and weakness.  The patient states that while at work yesterday, she began to notice that her right arm began to feel very weak.  She noticed she had difficulty coordinating things with her right arm and holding things.  She states that symptoms have persisted since then.  She also feels like her hand is subjectively numb or swollen.  She denies any right lower extremity or left-sided symptoms.  No changes in vision.  She has some baseline peripheral loss of vision due to old strokes.  This is not any worse.  Denies any fevers or chills.  Denies any other medical complaints.  Denies any difficulty speaking or swallowing.  No headache.  She has not changed any of her medications.  No alleviating aggravating factors.  Past Medical History:  Diagnosis Date   Anxiety    Asthma    CVA (cerebral vascular accident) (HCC) 03/2006   right occipital, Dr. Thad Ranger   Diabetes mellitus    GERD (gastroesophageal reflux disease)    Hyperlipidemia    Hypertension     Patient Active Problem List   Diagnosis Date Noted   CVA (cerebral vascular accident) (HCC) 08/04/2018   Pleuritic chest pain 12/23/2017   Acute non-recurrent frontal sinusitis 07/27/2017   SOB (shortness of breath) 03/08/2017   Vestibular dizziness 01/21/2016   Post concussive syndrome 10/09/2015   Tick bite 07/15/2015   Cerebrovascular disease 07/17/2014   Preventative health care 06/25/2014   Right sided sciatica 03/08/2013   MENOPAUSAL SYNDROME 09/10/2008   Mood disorder (HCC) 09/15/2007   Essential hypertension, benign 08/10/2006    Diabetes mellitus type 2, uncontrolled, without complications (HCC) 06/17/2006   HYPERCHOLESTEROLEMIA 06/17/2006   GERD 06/17/2006    Past Surgical History:  Procedure Laterality Date   CARDIOVASCULAR STRESS TEST  1/15   myoview. EF 60%   CESAREAN SECTION     ESOPHAGOGASTRODUODENOSCOPY  05/2005   TONSILLECTOMY AND ADENOIDECTOMY       OB History    Gravida  2   Para  2   Term  2   Preterm  0   AB  0   Living  2     SAB  0   TAB  0   Ectopic  0   Multiple  0   Live Births               Home Medications    Prior to Admission medications   Medication Sig Start Date End Date Taking? Authorizing Provider  amLODipine (NORVASC) 5 MG tablet Take 5 mg by mouth 2 (two) times daily. 02/14/18 02/14/19 Yes [provider]  aspirin EC 81 MG tablet Take 81 mg by mouth daily.   Yes [provider]  atorvastatin (LIPITOR) 40 MG tablet Take 1 tablet (40 mg total) by mouth daily. 04/08/17  Yes Karie Schwalbe, MD  glipiZIDE (GLUCOTROL XL) 5 MG 24 hr tablet Take 1 tablet (5 mg total) by mouth daily with breakfast. 07/27/17  Yes Karie Schwalbe, MD  lisinopril (PRINIVIL,ZESTRIL) 20 MG tablet TAKE 1 TABLET  BY MOUTH DAILY 07/12/17  Yes Karie SchwalbeLetvak, Richard I, MD    Family History Family History  Problem Relation Age of Onset   Coronary artery disease Mother    Diabetes Mellitus II Mother    Heart attack Mother [id number]   Diabetes Mellitus II Maternal Grandmother     Social History Social History   Tobacco Use   Smoking status: Never Smoker   Smokeless tobacco: Never Used  Substance Use Topics   Alcohol use: No    Alcohol/week: 0.0 standard drinks    Comment: wine occassionally   Drug use: No     Allergies   Citalopram, Doxycycline hyclate, Erythromycin, Montelukast sodium, Nitrofurantoin, Sulfa antibiotics, Tramadol hcl, Venlafaxine hcl, Amoxicillin-pot clavulanate, and Clarithromycin   Review of Systems Review of Systems    Constitutional: Negative for chills and fever.  HENT: Negative for sore throat.   Respiratory: Negative for shortness of breath.   Cardiovascular: Negative for chest pain.  Gastrointestinal: Negative for abdominal pain and nausea.  Genitourinary: Negative for flank pain.  Musculoskeletal: Negative for neck pain.  Skin: Negative for rash and wound.  Allergic/Immunologic: Negative for immunocompromised state.  Neurological: Positive for weakness and numbness.  Hematological: Does not bruise/bleed easily.     Physical Exam Updated Vital Signs BP (!) 169/91 (BP Location: Left Arm)    Pulse 95    Temp 98.1 F (36.7 C) (Oral)    Resp 20    Ht 5\' 3"  (1.6 m)    Wt 103.9 kg    SpO2 97%    BMI 40.57 kg/m   Physical Exam Vitals signs and nursing note reviewed.  Constitutional:      General: She is not in acute distress.    Appearance: She is well-developed.  HENT:     Head: Normocephalic and atraumatic.  Eyes:     Conjunctiva/sclera: Conjunctivae normal.  Neck:     Musculoskeletal: Neck supple.  Cardiovascular:     Rate and Rhythm: Normal rate and regular rhythm.     Heart sounds: Normal heart sounds. No murmur. No friction rub.  Pulmonary:     Effort: Pulmonary effort is normal. No respiratory distress.     Breath sounds: Normal breath sounds. No wheezing or rales.  Abdominal:     General: There is no distension.     Palpations: Abdomen is soft.     Tenderness: There is no abdominal tenderness.  Skin:    General: Skin is warm.     Capillary Refill: Capillary refill takes less than 2 seconds.  Neurological:     Mental Status: She is alert and oriented to person, place, and time.     Motor: No abnormal muscle tone.     Neurological Exam:  Mental Status: Alert and oriented to person, place, and time. Attention and concentration normal. Speech clear. Recent memory is intact. Cranial Nerves: Visual fields grossly intact. EOMI and PERRLA. No nystagmus noted. Facial sensation  intact at forehead, maxillary cheek, and chin/mandible bilaterally. No facial asymmetry or weakness. Hearing grossly normal. Uvula is midline, and palate elevates symmetrically. Normal SCM and trapezius strength. Tongue midline without fasciculations. Motor: Right pronator drift, strength 4/5 in RUE. 5/5 LLE, RLE, LUE. Reflexes: 2+ and symmetrical in all four extremities.  Sensation: Intact to light touch in upper and lower extremities distally bilaterally  But subjectively diminished RUE. Gait: Normal without ataxia. Coordination: Normal FTN bilaterally.      ED Treatments / Results  Labs (all labs ordered are listed, but only abnormal  results are displayed) Labs Reviewed  BASIC METABOLIC PANEL - Abnormal; Notable for the following components:      Result Value   Glucose, Bld 264 (*)    Creatinine, Ser 1.06 (*)    GFR calc non Af Amer 57 (*)    All other components within normal limits  URINALYSIS, COMPLETE (UACMP) WITH MICROSCOPIC - Abnormal; Notable for the following components:   Color, Urine YELLOW (*)    APPearance HAZY (*)    Glucose, UA >=500 (*)    Leukocytes,Ua TRACE (*)    Bacteria, UA MANY (*)    All other components within normal limits  COMPREHENSIVE METABOLIC PANEL - Abnormal; Notable for the following components:   Glucose, Bld 213 (*)    Creatinine, Ser 1.01 (*)    All other components within normal limits  LIPID PANEL - Abnormal; Notable for the following components:   Cholesterol 226 (*)    Triglycerides 152 (*)    HDL 36 (*)    LDL Cholesterol 160 (*)    All other components within normal limits  CREATININE, SERUM - Abnormal; Notable for the following components:   Creatinine, Ser 1.16 (*)    GFR calc non Af Amer 51 (*)    GFR calc Af Amer 59 (*)    All other components within normal limits  GLUCOSE, CAPILLARY - Abnormal; Notable for the following components:   Glucose-Capillary 184 (*)    All other components within normal limits  NOVEL CORONAVIRUS,  NAA (HOSPITAL ORDER, SEND-OUT TO REF LAB)  URINE CULTURE  SARS CORONAVIRUS 2 (HOSPITAL ORDER, PERFORMED IN [facility])  CBC  ETHANOL  PROTIME-INR  APTT  URINE DRUG SCREEN, QUALITATIVE ([facility] ONLY)  CBC WITH DIFFERENTIAL/PLATELET  FIBRIN DERIVATIVES D-DIMER ([facility] ONLY)  CBC  HIV ANTIBODY (ROUTINE TESTING W REFLEX)  HEMOGLOBIN A1C    EKG Sinus tachycardia, ventricular rate 112.  Nonspecific ST segment changes in lateral leads, no overt ST elevations or depressions.  PR 158, QRS 86, QTc 447.  Radiology Dg Chest 2 View  Result Date: 08/04/2018 CLINICAL DATA:  CVA. EXAM: CHEST - 2 VIEW COMPARISON:  04/13/2018 FINDINGS: Upper normal heart size.The cardiomediastinal contours are normal. The lungs are clear. Pulmonary vasculature is normal. No consolidation, pleural effusion, or pneumothorax. No acute osseous abnormalities are seen. IMPRESSION: No acute chest findings. Electronically Signed   By: Narda RutherfordMelanie  Sanford M.D.   On: 08/04/2018 03:16   Ct Head Wo Contrast  Result Date: 08/03/2018 CLINICAL DATA:  Cyst 6 right hand numbness, weakness EXAM: CT HEAD WITHOUT CONTRAST TECHNIQUE: Contiguous axial images were obtained from the base of the skull through the vertex without intravenous contrast. COMPARISON:  MRI 01/03/2016 FINDINGS: Brain: Old bilateral occipital infarcts and bilateral basal ganglia lacunar infarcts, stable. No acute intracranial abnormality. Specifically, no hemorrhage, hydrocephalus, mass lesion, acute infarction, or significant intracranial injury. Vascular: No hyperdense vessel or unexpected calcification. Skull: No acute calvarial abnormality. Sinuses/Orbits: Visualized paranasal sinuses and mastoids clear. Orbital soft tissues unremarkable. 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 No acute intracranial abnormality. Electronically Signed   By: Charlett NoseKevin  Dover M.D.   On: 08/03/2018 19:34   Mr Maxine GlennMra Neck W Wo Contrast  Result  Date: 08/04/2018 CLINICAL DATA:  Cerebral hemorrhage suspected. Right hand numbness/weakness. EXAM: MRI HEAD WITHOUT CONTRAST MRA NECK WITHOUT AND WITH CONTRAST TECHNIQUE: Multiplanar, multiecho pulse sequences of the brain and surrounding structures were obtained without intravenous contrast. Angiographic images of the neck were obtained using  MRA technique with and without intravenous contrast. Carotid stenosis measurements (when applicable) are obtained utilizing NASCET criteria, using the distal internal carotid diameter as the denominator. CONTRAST:  Dose is not currently availabledue to EPIC server failure COMPARISON:  Head CT from yesterday FINDINGS: MRI HEAD FINDINGS Brain: 11 mm acute infarct in the left centrum semiovale which correlates with symptoms. There is patchy FLAIR hyperintensity in the cerebral white matter with thin confluent rim around the lateral ventricles that is attributed to chronic microvascular ischemia given constellation of findings. A remote lacunar infarct is present at the right caudate body. Small remote bilateral occipital cortex infarcts that are notably symmetric, question prior posterior reversible encephalopathy syndrome-although there is posterior circulation stenotic disease as described below. No acute or chronic blood products, hydrocephalus, masslike finding, or atrophy Vascular: Major flow voids are preserved Skull and upper cervical spine: Negative for marrow lesion Sinuses/Orbits: Negative MRA NECK FINDINGS Noncontrast mask shows no gross mass or swelling. There is antegrade flow in both carotid and the left vertebral arteries. No meaningful flow is seen within the right vertebral artery until the V3 segment. There is robust flow in the cervical left vertebral artery followed by tandem high-grade stenoses of the left V4 segment. There is a moderate narrowing of the proximal basilar. Likely high-grade narrowing at the right vertebrobasilar junction. Nonvisualized right  proximal vertebral artery was noted on chest CT from 02/24/2018-this is likely the non dominant side. No stenosis or ulceration seen in the carotids. IMPRESSION: Brain MRI: 1. Acute lacunar infarct in the left centrum semiovale. 2. Chronic small vessel ischemia with remote right caudate lacunar infarct. 3. Small remote bilateral occipital infarcts. Neck MRA: 1. Minimal flow in the right vertebral artery until the V3 segment. 2. Robust flow in the left cervical vertebral artery but tandem high-grade stenoses of the left V4 segment and high-grade narrowing at the right vertebrobasilar junction. Moderate proximal basilar stenosis. 3. Smooth and widely patent cervical carotids. Electronically Signed   By: Marnee Spring M.D.   On: 08/04/2018 06:48   Mr Brain Wo Contrast  Result Date: 08/04/2018 CLINICAL DATA:  Cerebral hemorrhage suspected. Right hand numbness/weakness. EXAM: MRI HEAD WITHOUT CONTRAST MRA NECK WITHOUT AND WITH CONTRAST TECHNIQUE: Multiplanar, multiecho pulse sequences of the brain and surrounding structures were obtained without intravenous contrast. Angiographic images of the neck were obtained using MRA technique with and without intravenous contrast. Carotid stenosis measurements (when applicable) are obtained utilizing NASCET criteria, using the distal internal carotid diameter as the denominator. CONTRAST:  Dose is not currently availabledue to EPIC server failure COMPARISON:  Head CT from yesterday FINDINGS: MRI HEAD FINDINGS Brain: 11 mm acute infarct in the left centrum semiovale which correlates with symptoms. There is patchy FLAIR hyperintensity in the cerebral white matter with thin confluent rim around the lateral ventricles that is attributed to chronic microvascular ischemia given constellation of findings. A remote lacunar infarct is present at the right caudate body. Small remote bilateral occipital cortex infarcts that are notably symmetric, question prior posterior reversible  encephalopathy syndrome-although there is posterior circulation stenotic disease as described below. No acute or chronic blood products, hydrocephalus, masslike finding, or atrophy Vascular: Major flow voids are preserved Skull and upper cervical spine: Negative for marrow lesion Sinuses/Orbits: Negative MRA NECK FINDINGS Noncontrast mask shows no gross mass or swelling. There is antegrade flow in both carotid and the left vertebral arteries. No meaningful flow is seen within the right vertebral artery until the V3 segment. There is robust flow in  the cervical left vertebral artery followed by tandem high-grade stenoses of the left V4 segment. There is a moderate narrowing of the proximal basilar. Likely high-grade narrowing at the right vertebrobasilar junction. Nonvisualized right proximal vertebral artery was noted on chest CT from 02/24/2018-this is likely the non dominant side. No stenosis or ulceration seen in the carotids. IMPRESSION: Brain MRI: 1. Acute lacunar infarct in the left centrum semiovale. 2. Chronic small vessel ischemia with remote right caudate lacunar infarct. 3. Small remote bilateral occipital infarcts. Neck MRA: 1. Minimal flow in the right vertebral artery until the V3 segment. 2. Robust flow in the left cervical vertebral artery but tandem high-grade stenoses of the left V4 segment and high-grade narrowing at the right vertebrobasilar junction. Moderate proximal basilar stenosis. 3. Smooth and widely patent cervical carotids. Electronically Signed   By: Marnee SpringJonathon  Watts M.D.   On: 08/04/2018 06:48    Procedures Procedures (including critical care time)  Medications Ordered in ED Medications  atorvastatin (LIPITOR) tablet 40 mg (40 mg Oral Given 08/04/18 0902)  glipiZIDE (GLUCOTROL XL) 24 hr tablet 5 mg (5 mg Oral Given 08/04/18 0902)  lisinopril (ZESTRIL) tablet 20 mg (20 mg Oral Given 08/04/18 0903)  amLODipine (NORVASC) tablet 5 mg (5 mg Oral Given 08/04/18 0903)  0.9 %  sodium  chloride infusion ( Intravenous New Bag/Given 08/04/18 0456)  acetaminophen (TYLENOL) tablet 650 mg (has no administration in time range)    Or  acetaminophen (TYLENOL) solution 650 mg (has no administration in time range)    Or  acetaminophen (TYLENOL) suppository 650 mg (has no administration in time range)  senna-docusate (Senokot-S) tablet 1 tablet (has no administration in time range)  enoxaparin (LOVENOX) injection 40 mg (40 mg Subcutaneous Given 08/04/18 0903)  cefTRIAXone (ROCEPHIN) 1 g in sodium chloride 0.9 % 100 mL IVPB (has no administration in time range)  LORazepam (ATIVAN) injection 1 mg (1 mg Intravenous Not Given 08/04/18 0345)  LORazepam (ATIVAN) tablet 0.5 mg (0.5 mg Oral Not Given 08/04/18 0529)  insulin aspart (novoLOG) injection 0-5 Units (has no administration in time range)  insulin aspart (novoLOG) injection 0-15 Units (3 Units Subcutaneous Given 08/04/18 0901)  pantoprazole (PROTONIX) EC tablet 40 mg (40 mg Oral Given 08/04/18 0902)  diphenhydrAMINE (BENADRYL) capsule 25 mg (25 mg Oral Not Given 08/04/18 0904)  aspirin EC tablet 325 mg (has no administration in time range)  cefTRIAXone (ROCEPHIN) 1 g in sodium chloride 0.9 % 100 mL IVPB ( Intravenous Stopped 08/03/18 2112)  aspirin chewable tablet 324 mg (324 mg Oral Given 08/03/18 2149)   stroke: mapping our early stages of recovery book ( Does not apply Given 08/04/18 0500)  gadobutrol (GADAVIST) 1 MMOL/ML injection 9 mL (9 mLs Intravenous Contrast Given 08/04/18 0440)     Initial Impression / Assessment and Plan / ED Course  I have reviewed the triage vital signs and the nursing notes.  Pertinent labs & imaging results that were available during my care of the patient were reviewed by me and considered in my medical decision making (see chart for details).        63 year old female here with right arm weakness and subjective numbness.  Concern for stroke with positive pronator drift.  Less likely cervical  abnormality, and she has no neck pain.  She has a history of previous strokes.  She is outside the window of TPA.  CT head without acute abnormality. Lab work reviewed and is overall reassuring.  EKG nonischemic.  Admit for stroke work-up.  Final Clinical Impressions(s) / ED Diagnoses   Final diagnoses:  Cerebrovascular accident (CVA), unspecified mechanism [facility])    ED Discharge Orders    None       Duffy Bruce, MD 08/04/18 Hassell Done    Duffy Bruce, MD 08/16/18 [phone]

## 2018-08-03 NOTE — ED Triage Notes (Signed)
Pt complaints of right hand numbness and generalized weakness that started last night. Pt states her hand is the most concerning because it is significant enough to debilitate her from her ADL. Pt reports hx of tia.

## 2018-08-03 NOTE — ED Notes (Signed)
Pt given meal tray and diet coke at this time °

## 2018-08-03 NOTE — ED Notes (Signed)
Patient transported to CT 

## 2018-08-03 NOTE — Telephone Encounter (Signed)
Pt said starting 08/02/18 pt began with rt arm feeling heavy; today rt arm and hand feel heavy and cannot grasp anything with rt hand. Some difficulty in walking with rt leg. Never had anything like this before and no known injury. No CP,dizziness and no vision changes. Pt does have SOB (seeing pulmonologist); pt had H/A on 08/02/18. Pt said she does not feel good. Pt said her daughter will take her to [facility] ED for eval now. FYI to Dr Silvio Pate.

## 2018-08-03 NOTE — ED Notes (Signed)
This RN assisted pt to toilet. Pt was able to walk with minimal assistance.

## 2018-08-04 ENCOUNTER — Inpatient Hospital Stay (HOSPITAL_COMMUNITY)
Admit: 2018-08-04 | Discharge: 2018-08-04 | Disposition: A | Discharge status: Home / Self Care | Payer: Managed Care, Other (non HMO) | Attending: Nurse Practitioner | Admitting: Nurse Practitioner

## 2018-08-04 ENCOUNTER — Inpatient Hospital Stay: Payer: Managed Care, Other (non HMO)

## 2018-08-04 ENCOUNTER — Other Ambulatory Visit: Payer: Self-pay

## 2018-08-04 DIAGNOSIS — Z7984 Long term (current) use of oral hypoglycemic drugs: Secondary | ICD-10-CM | POA: Diagnosis not present

## 2018-08-04 DIAGNOSIS — I639 Cerebral infarction, unspecified: Secondary | ICD-10-CM | POA: Diagnosis not present

## 2018-08-04 DIAGNOSIS — G8321 Monoplegia of upper limb affecting right dominant side: Secondary | ICD-10-CM | POA: Diagnosis present

## 2018-08-04 DIAGNOSIS — Z1159 Encounter for screening for other viral diseases: Secondary | ICD-10-CM | POA: Diagnosis not present

## 2018-08-04 DIAGNOSIS — Z6841 Body Mass Index (BMI) 40.0 and over, adult: Secondary | ICD-10-CM | POA: Diagnosis not present

## 2018-08-04 DIAGNOSIS — I69998 Other sequelae following unspecified cerebrovascular disease: Secondary | ICD-10-CM | POA: Diagnosis not present

## 2018-08-04 DIAGNOSIS — I10 Essential (primary) hypertension: Secondary | ICD-10-CM | POA: Diagnosis present

## 2018-08-04 DIAGNOSIS — N390 Urinary tract infection, site not specified: Secondary | ICD-10-CM | POA: Diagnosis present

## 2018-08-04 DIAGNOSIS — H547 Unspecified visual loss: Secondary | ICD-10-CM | POA: Diagnosis present

## 2018-08-04 DIAGNOSIS — I651 Occlusion and stenosis of basilar artery: Secondary | ICD-10-CM | POA: Diagnosis present

## 2018-08-04 DIAGNOSIS — I6381 Other cerebral infarction due to occlusion or stenosis of small artery: Secondary | ICD-10-CM | POA: Diagnosis present

## 2018-08-04 DIAGNOSIS — R29701 NIHSS score 1: Secondary | ICD-10-CM | POA: Diagnosis present

## 2018-08-04 DIAGNOSIS — Z8249 Family history of ischemic heart disease and other diseases of the circulatory system: Secondary | ICD-10-CM | POA: Diagnosis not present

## 2018-08-04 DIAGNOSIS — Z7982 Long term (current) use of aspirin: Secondary | ICD-10-CM | POA: Diagnosis not present

## 2018-08-04 DIAGNOSIS — Z833 Family history of diabetes mellitus: Secondary | ICD-10-CM | POA: Diagnosis not present

## 2018-08-04 DIAGNOSIS — E119 Type 2 diabetes mellitus without complications: Secondary | ICD-10-CM | POA: Diagnosis present

## 2018-08-04 DIAGNOSIS — E785 Hyperlipidemia, unspecified: Secondary | ICD-10-CM | POA: Diagnosis present

## 2018-08-04 LAB — LIPID PANEL
Cholesterol: 226 mg/dL — ABNORMAL HIGH (ref 0–200)
HDL: 36 mg/dL — ABNORMAL LOW (ref >40)
LDL Cholesterol: 160 mg/dL — ABNORMAL HIGH (ref 0–99)
Total CHOL/HDL Ratio: 6.3 RATIO
Triglycerides: 152 mg/dL — ABNORMAL HIGH (ref <150)
VLDL: 30 mg/dL (ref 0–40)

## 2018-08-04 LAB — CBC
HCT: 41.1 % (ref 36.0–46.0)
Hemoglobin: 13.9 g/dL (ref 12.0–15.0)
MCH: 30.8 pg (ref 26.0–34.0)
MCHC: 33.8 g/dL (ref 30.0–36.0)
MCV: 91.1 fL (ref 80.0–100.0)
Platelets: 224 10*3/uL (ref 150–400)
RBC: 4.51 MIL/uL (ref 3.87–5.11)
RDW: 12.3 % (ref 11.5–15.5)
WBC: 6.3 10*3/uL (ref 4.0–10.5)
nRBC: 0 % (ref 0.0–0.2)

## 2018-08-04 LAB — CREATININE, SERUM
Creatinine, Ser: 1.16 mg/dL — ABNORMAL HIGH (ref 0.44–1.00)
GFR calc Af Amer: 59 mL/min — ABNORMAL LOW (ref >60)
GFR calc non Af Amer: 51 mL/min — ABNORMAL LOW (ref >60)

## 2018-08-04 LAB — GLUCOSE, CAPILLARY
Glucose-Capillary: 184 mg/dL — ABNORMAL HIGH (ref 70–99)
Glucose-Capillary: 119 mg/dL — ABNORMAL HIGH (ref 70–99)
Glucose-Capillary: 97 mg/dL (ref 70–99)
Glucose-Capillary: 163 mg/dL — ABNORMAL HIGH (ref 70–99)

## 2018-08-04 LAB — HEMOGLOBIN A1C
Hgb A1c MFr Bld: 8.5 % — ABNORMAL HIGH (ref 4.8–5.6)
Mean Plasma Glucose: 197.25 mg/dL

## 2018-08-04 LAB — SARS CORONAVIRUS 2 BY RT PCR (HOSPITAL ORDER, PERFORMED IN ~~LOC~~ HOSPITAL LAB): SARS Coronavirus 2: NEGATIVE

## 2018-08-04 LAB — ECHOCARDIOGRAM COMPLETE
Height: 63 in
Weight: 3664 oz

## 2018-08-04 LAB — FIBRIN DERIVATIVES D-DIMER (ARMC ONLY): Fibrin derivatives D-dimer (ARMC): 340.19 ng/mL (FEU) (ref 0.00–499.00)

## 2018-08-04 MED ORDER — ACETAMINOPHEN 325 MG PO TABS
650.0000 mg | ORAL_TABLET | ORAL | Status: DC | PRN
  Administered 2018-08-04: 15:00:00 650 mg via ORAL
  Filled 2018-08-04: qty 2

## 2018-08-04 MED ORDER — SODIUM CHLORIDE 0.9 % IV SOLN
1.0000 g | INTRAVENOUS | Status: DC
  Filled 2018-08-04: qty 10

## 2018-08-04 MED ORDER — ASPIRIN EC 325 MG PO TBEC
325.0000 mg | DELAYED_RELEASE_TABLET | Freq: Every day | ORAL | Status: DC
  Administered 2018-08-05: 325 mg via ORAL
  Filled 2018-08-04: qty 1

## 2018-08-04 MED ORDER — SENNOSIDES-DOCUSATE SODIUM 8.6-50 MG PO TABS: 1.0000 | ORAL_TABLET | Freq: Every evening | ORAL | Status: DC | PRN

## 2018-08-04 MED ORDER — ACETAMINOPHEN 160 MG/5ML PO SOLN
650.0000 mg | ORAL | Status: DC | PRN
  Filled 2018-08-04: qty 20.3

## 2018-08-04 MED ORDER — ASPIRIN EC 81 MG PO TBEC
81.0000 mg | DELAYED_RELEASE_TABLET | Freq: Every day | ORAL | Status: DC
  Administered 2018-08-04: 81 mg via ORAL
  Filled 2018-08-04: qty 1

## 2018-08-04 MED ORDER — ACETAMINOPHEN 650 MG RE SUPP: 650.0000 mg | RECTAL | Status: DC | PRN

## 2018-08-04 MED ORDER — PANTOPRAZOLE SODIUM 40 MG PO TBEC
40.0000 mg | DELAYED_RELEASE_TABLET | Freq: Every day | ORAL | Status: DC
  Administered 2018-08-04 – 2018-08-05 (×2): 40 mg via ORAL
  Filled 2018-08-04 (×2): qty 1

## 2018-08-04 MED ORDER — LORAZEPAM 2 MG/ML IJ SOLN
1.0000 mg | Freq: Once | INTRAMUSCULAR | Status: DC
  Filled 2018-08-04: qty 1

## 2018-08-04 MED ORDER — INSULIN ASPART 100 UNIT/ML ~~LOC~~ SOLN: 0.0000 [IU] | Freq: Every day | SUBCUTANEOUS | Status: DC

## 2018-08-04 MED ORDER — GLIPIZIDE ER 5 MG PO TB24
5.0000 mg | ORAL_TABLET | Freq: Every day | ORAL | Status: DC
  Administered 2018-08-04 – 2018-08-05 (×2): 5 mg via ORAL
  Filled 2018-08-04 (×2): qty 1

## 2018-08-04 MED ORDER — LISINOPRIL 20 MG PO TABS
20.0000 mg | ORAL_TABLET | Freq: Every day | ORAL | Status: DC
  Administered 2018-08-04 – 2018-08-05 (×2): 20 mg via ORAL
  Filled 2018-08-04 (×2): qty 1

## 2018-08-04 MED ORDER — STROKE: EARLY STAGES OF RECOVERY BOOK
Freq: Once | Status: AC
  Administered 2018-08-04: 05:00:00

## 2018-08-04 MED ORDER — DIPHENHYDRAMINE HCL 25 MG PO CAPS
25.0000 mg | ORAL_CAPSULE | ORAL | Status: AC
  Filled 2018-08-04: qty 1

## 2018-08-04 MED ORDER — SODIUM CHLORIDE 0.9 % IV SOLN
INTRAVENOUS | Status: DC
  Administered 2018-08-04 (×2): via INTRAVENOUS

## 2018-08-04 MED ORDER — ENOXAPARIN SODIUM 40 MG/0.4ML ~~LOC~~ SOLN
40.0000 mg | SUBCUTANEOUS | Status: DC
  Administered 2018-08-04: 09:00:00 40 mg via SUBCUTANEOUS
  Filled 2018-08-04 (×2): qty 0.4

## 2018-08-04 MED ORDER — AMLODIPINE BESYLATE 5 MG PO TABS
5.0000 mg | ORAL_TABLET | Freq: Two times a day (BID) | ORAL | Status: DC
  Administered 2018-08-04 – 2018-08-05 (×3): 5 mg via ORAL
  Filled 2018-08-04 (×3): qty 1

## 2018-08-04 MED ORDER — LORAZEPAM 0.5 MG PO TABS: 0.5000 mg | ORAL_TABLET | Freq: Once | ORAL | Status: DC

## 2018-08-04 MED ORDER — INSULIN ASPART 100 UNIT/ML ~~LOC~~ SOLN
0.0000 [IU] | Freq: Three times a day (TID) | SUBCUTANEOUS | Status: DC
  Administered 2018-08-04: 09:00:00 3 [IU] via SUBCUTANEOUS
  Filled 2018-08-04: qty 1

## 2018-08-04 MED ORDER — ATORVASTATIN CALCIUM 20 MG PO TABS
40.0000 mg | ORAL_TABLET | Freq: Every day | ORAL | Status: DC
  Administered 2018-08-04 – 2018-08-05 (×2): 40 mg via ORAL
  Filled 2018-08-04 (×2): qty 2

## 2018-08-04 MED ORDER — GADOBUTROL 1 MMOL/ML IV SOLN
9.0000 mL | Freq: Once | INTRAVENOUS | Status: AC | PRN
  Administered 2018-08-04: 05:00:00 9 mL via INTRAVENOUS

## 2018-08-04 NOTE — Telephone Encounter (Signed)
Pt was admitted for a CVA

## 2018-08-04 NOTE — Progress Notes (Signed)
SLP Cancellation Note  Patient Details Name: Tami Jones MRN: [id number] DOB: March 10, 1955   Cancelled treatment:       Reason Eval/Treat Not Completed: SLP screened, no needs identified, will sign off(chart reviewed; consulted NSG re: pt's status, met w/ pt) Pt denied any difficulty swallowing and is currently on a regular diet; tolerates swallowing pills w/ water per NSG. Pt conversed at conversational level w/out deficits noted; pt and NSG denied any speech-language deficits.  No further skilled ST services indicated as pt appears at her baseline. Pt agreed. NSG to reconsult if any change in status.     Orinda Kenner, MS, CCC-SLP , 08/04/2018, 9:53 AM

## 2018-08-04 NOTE — ED Notes (Signed)
ED TO INPATIENT HANDOFF REPORT  ED Nurse Name and Phone #: Salma Walrond 18  S Name/Age/Gender Tami Jones 63 y.o. female Room/Bed: ED08A/ED08A  Code Status   Code Status: Prior  Home/SNF/Other Home Patient oriented to: self, place, time and situation Is this baseline? Yes   Triage Complete: Triage complete  Chief Complaint right hand numbness  Triage Note Pt complaints of right hand numbness and generalized weakness that started last night. Pt states her hand is the most concerning because it is significant enough to debilitate her from her ADL. Pt reports hx of tia.    Allergies Allergies  Allergen Reactions  . Citalopram Other (See Comments)    Feels odd  . Doxycycline Hyclate Nausea And Vomiting  . Erythromycin Other (See Comments)    Irritated stomach  . Montelukast Sodium Itching  . Nitrofurantoin Nausea And Vomiting  . Sulfa Antibiotics Nausea And Vomiting  . Tramadol Hcl Other (See Comments)    Feels odd  . Venlafaxine Hcl Other (See Comments)    Feels odd  . Amoxicillin-Pot Clavulanate Nausea And Vomiting  . Clarithromycin Rash     See ER note 02/2017    Level of Care/Admitting Diagnosis ED Disposition    ED Disposition Condition [facility] Area: [place] [[id number]]  Level of Care: Med-Surg [16]  Covid Evaluation: Screening Protocol (No Symptoms)  Diagnosis: CVA (cerebral vascular accident) [facility]) [[id number]]  Admitting Physician: Mayer Camel [[id number]]  Attending Physician: Mayer Camel [[id number]]  Estimated length of stay: past midnight tomorrow  Certification:: I certify this patient will need inpatient services for at least 2 midnights  PT Class (Do Not Modify): Inpatient [101]  PT Acc Code (Do Not Modify): Private [1]       B Medical/Surgery History Past Medical History:  Diagnosis Date  . Anxiety   . Asthma   . 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 Doy Mince  .  Diabetes mellitus   . GERD (gastroesophageal reflux disease)   . Hyperlipidemia   . Hypertension    Past Surgical History:  Procedure Laterality Date  . CARDIOVASCULAR STRESS TEST  1/15   myoview. EF 60%  . CESAREAN SECTION    . ESOPHAGOGASTRODUODENOSCOPY  05/2005  . TONSILLECTOMY AND ADENOIDECTOMY       A IV Location/Drains/Wounds Patient Lines/Drains/Airways Status   Active Line/Drains/Airways    Name:   Placement date:   Placement time:   Site:   Days:   Peripheral IV 08/03/18 Right Antecubital   08/03/18    1932    Antecubital   1          Intake/Output Last 24 hours  Intake/Output Summary (Last 24 hours) at 08/04/2018 0044 Last data filed at 08/03/2018 2134 Gross per 24 hour  Intake 98.34 ml  Output -  Net 98.34 ml    Labs/Imaging Results for orders placed or performed during the hospital encounter of 08/03/18 (from the past 48 hour(s))  Basic metabolic panel     Status: Abnormal   Collection Time: 08/03/18  1:38 PM  Result Value Ref Range   Sodium 138 135 - 145 mmol/L   Potassium 3.6 3.5 - 5.1 mmol/L   Chloride 102 98 - 111 mmol/L   CO2 25 22 - 32 mmol/L   Glucose, Bld 264 (H) 70 - 99 mg/dL   BUN 16 8 - 23 mg/dL   Creatinine, Ser 1.06 (H) 0.44 - 1.00 mg/dL   Calcium  9.3 8.9 - 10.3 mg/dL   GFR calc non Af Amer 57 (L) >60 mL/min   GFR calc Af Amer >60 >60 mL/min   Anion gap 11 5 - 15    Comment: Performed at [facility], [address] [place], [place] [address]  CBC     Status: None   Collection Time: 08/03/18  1:38 PM  Result Value Ref Range   WBC 6.0 4.0 - 10.5 K/uL   RBC 4.61 3.87 - 5.11 MIL/uL   Hemoglobin 14.1 12.0 - 15.0 g/dL   HCT 60.441.9 54.036.0 - 98.146.0 %   MCV 90.9 80.0 - 100.0 fL   MCH 30.6 26.0 - 34.0 pg   MCHC 33.7 30.0 - 36.0 g/dL   RDW 19.112.2 47.811.5 - 29.515.5 %   Platelets 224 150 - 400 K/uL   nRBC 0.0 0.0 - 0.2 %    Comment: Performed at [facility], [address] [place], [place] [address]  CBC with Differential/Platelet      Status: None   Collection Time: 08/03/18  1:38 PM  Result Value Ref Range   WBC 5.9 4.0 - 10.5 K/uL   RBC 4.60 3.87 - 5.11 MIL/uL   Hemoglobin 14.1 12.0 - 15.0 g/dL   HCT 86.542.2 78.436.0 - 69.646.0 %   MCV 91.7 80.0 - 100.0 fL   MCH 30.7 26.0 - 34.0 pg   MCHC 33.4 30.0 - 36.0 g/dL   RDW 29.512.3 28.411.5 - 13.215.5 %   Platelets 248 150 - 400 K/uL   nRBC 0.0 0.0 - 0.2 %   Neutrophils Relative % 62 %   Neutro Abs 3.7 1.7 - 7.7 K/uL   Lymphocytes Relative 33 %   Lymphs Abs 1.9 0.7 - 4.0 K/uL   Monocytes Relative 4 %   Monocytes Absolute 0.3 0.1 - 1.0 K/uL   Eosinophils Relative 1 %   Eosinophils Absolute 0.0 0.0 - 0.5 K/uL   Basophils Relative 0 %   Basophils Absolute 0.0 0.0 - 0.1 K/uL   Immature Granulocytes 0 %   Abs Immature Granulocytes 0.01 0.00 - 0.07 K/uL    Comment: Performed at [facility], [address] [place], [place] [address]  Urinalysis, Complete w Microscopic     Status: Abnormal   Collection Time: 08/03/18  7:27 PM  Result Value Ref Range   Color, Urine YELLOW (A) YELLOW   APPearance HAZY (A) CLEAR   Specific Gravity, Urine 1.014 1.005 - 1.030   pH 6.0 5.0 - 8.0   Glucose, UA >=500 (A) NEGATIVE mg/dL   Hgb urine dipstick NEGATIVE NEGATIVE   Bilirubin Urine NEGATIVE NEGATIVE   Ketones, ur NEGATIVE NEGATIVE mg/dL   Protein, ur NEGATIVE NEGATIVE mg/dL   Nitrite NEGATIVE NEGATIVE   Leukocytes,Ua TRACE (A) NEGATIVE   RBC / HPF 0-5 0 - 5 RBC/hpf   WBC, UA 11-20 0 - 5 WBC/hpf   Bacteria, UA MANY (A) NONE SEEN   Squamous Epithelial / LPF 0-5 0 - 5    Comment: Performed at [facility], [address] [place], [place] [address]  Ethanol     Status: None   Collection Time: 08/03/18  7:27 PM  Result Value Ref Range   Alcohol, Ethyl (B) <10 <10 mg/dL    Comment: (NOTE) Lowest detectable limit for serum alcohol is 10 mg/dL. For medical purposes only. Performed at [facility], [address] [place], [place] [address]   Protime-INR     Status:  None  Collection Time: 08/03/18  7:27 PM  Result Value Ref Range   Prothrombin Time 11.8 11.4 - 15.2 seconds   INR 0.9 0.8 - 1.2    Comment: (NOTE) INR goal varies based on device and disease states. Performed at [facility], [address] [place], [place] [address]   APTT     Status: None   Collection Time: 08/03/18  7:27 PM  Result Value Ref Range   aPTT 28 24 - 36 seconds    Comment: Performed at [facility], [address] [place], [place] [address]  Urine Drug Screen, Qualitative     Status: None   Collection Time: 08/03/18  7:27 PM  Result Value Ref Range   Tricyclic, Ur Screen NONE DETECTED NONE DETECTED   Amphetamines, Ur Screen NONE DETECTED NONE DETECTED   MDMA (Ecstasy)Ur Screen NONE DETECTED NONE DETECTED   Cocaine Metabolite,Ur [place] NONE DETECTED NONE DETECTED   Opiate, Ur Screen NONE DETECTED NONE DETECTED   Phencyclidine (PCP) Ur S NONE DETECTED NONE DETECTED   Cannabinoid 50 Ng, Ur Max NONE DETECTED NONE DETECTED   Barbiturates, Ur Screen NONE DETECTED NONE DETECTED   Benzodiazepine, Ur Scrn NONE DETECTED NONE DETECTED   Methadone Scn, Ur NONE DETECTED NONE DETECTED    Comment: (NOTE) Tricyclics + metabolites, urine    Cutoff 1000 ng/mL Amphetamines + metabolites, urine  Cutoff 1000 ng/mL MDMA (Ecstasy), urine              Cutoff 500 ng/mL Cocaine Metabolite, urine          Cutoff 300 ng/mL Opiate + metabolites, urine        Cutoff 300 ng/mL Phencyclidine (PCP), urine         Cutoff 25 ng/mL Cannabinoid, urine                 Cutoff 50 ng/mL Barbiturates + metabolites, urine  Cutoff 200 ng/mL Benzodiazepine, urine              Cutoff 200 ng/mL Methadone, urine                   Cutoff 300 ng/mL The urine drug screen provides only a preliminary, unconfirmed analytical test result and should not be used for non-medical purposes. Clinical consideration and professional judgment should be applied to any positive drug screen result due to  possible interfering substances. A more specific alternate chemical method must be used in order to obtain a confirmed analytical result. Gas chromatography / mass spectrometry (GC/MS) is the preferred confirmat ory method. Performed at [facility], [address] [place], [place] [address]   Comprehensive metabolic panel     Status: Abnormal   Collection Time: 08/03/18  7:27 PM  Result Value Ref Range   Sodium 138 135 - 145 mmol/L   Potassium 3.8 3.5 - 5.1 mmol/L   Chloride 101 98 - 111 mmol/L   CO2 26 22 - 32 mmol/L   Glucose, Bld 213 (H) 70 - 99 mg/dL   BUN 18 8 - 23 mg/dL   Creatinine, Ser 9.14 (H) 0.44 - 1.00 mg/dL   Calcium 9.4 8.9 - 78.2 mg/dL   Total Protein 7.6 6.5 - 8.1 g/dL   Albumin 4.3 3.5 - 5.0 g/dL   AST 25 15 - 41 U/L   ALT 31 0 - 44 U/L   Alkaline Phosphatase 104 38 - 126 U/L   Total Bilirubin 0.5 0.3 - 1.2 mg/dL   GFR calc non  Af Amer >60 >60 mL/min   GFR calc Af Amer >60 >60 mL/min   Anion gap 11 5 - 15    Comment: Performed at [facility], [address] [place], [place] [address]   Ct Head Wo Contrast  Result Date: 08/03/2018 CLINICAL DATA:  Cyst 6 right hand numbness, weakness EXAM: CT HEAD WITHOUT CONTRAST TECHNIQUE: Contiguous axial images were obtained from the base of the skull through the vertex without intravenous contrast. COMPARISON:  MRI 01/03/2016 FINDINGS: Brain: Old bilateral occipital infarcts and bilateral basal ganglia lacunar infarcts, stable. No acute intracranial abnormality. Specifically, no hemorrhage, hydrocephalus, mass lesion, acute infarction, or significant intracranial injury. Vascular: No hyperdense vessel or unexpected calcification. Skull: No acute calvarial abnormality. Sinuses/Orbits: Visualized paranasal sinuses and mastoids clear. Orbital soft tissues unremarkable. 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 No acute intracranial abnormality. Electronically Signed    By: Charlett NoseKevin  Dover M.D.   On: 08/03/2018 19:34    Pending Labs Unresulted Labs (From admission, onward)    Start     Ordered   08/03/18 2335  Fibrin derivatives D-Dimer ([facility] only)  Once,   STAT     08/03/18 2334   08/03/18 2135  Urine culture  Add-on,   AD    Question:  Patient immune status  Answer:  Normal   08/03/18 2134   08/03/18 1953  Novel Coronavirus,NAA,(SEND-OUT TO REF LAB - TAT 24-48 hrs); Hosp Order  (Asymptomatic Patients Labs)  Once,   STAT    Question:  Rule Out  Answer:  Yes   08/03/18 1952   Signed and Held  HIV antibody (Routine Testing)  Once,   R     Signed and Held   Signed and Held  Hemoglobin A1c  Tomorrow morning,   R     Signed and Held   Signed and Held  Lipid panel  Tomorrow morning,   R    Comments: Fasting    Signed and Held   Signed and Held  CBC  (enoxaparin (LOVENOX)    CrCl >/= 30 ml/min)  Once,   R    Comments: Baseline for enoxaparin therapy IF NOT ALREADY DRAWN.  Notify MD if PLT < 100 K.    Signed and Held   Signed and Held  Creatinine, serum  (enoxaparin (LOVENOX)    CrCl >/= 30 ml/min)  Once,   R    Comments: Baseline for enoxaparin therapy IF NOT ALREADY DRAWN.    Signed and Held   Signed and Held  Creatinine, serum  (enoxaparin (LOVENOX)    CrCl >/= 30 ml/min)  Weekly,   R    Comments: while on enoxaparin therapy    Signed and Held          Vitals/Pain Today's Vitals   08/03/18 2100 08/03/18 2115 08/03/18 2200 08/03/18 2230  BP: (!) 179/91   (!) 160/90  Pulse:  79 86 84  Resp:   14 16  Temp:      TempSrc:      SpO2:  98% 98% 97%  Weight:      Height:      PainSc:        Isolation Precautions No active isolations  Medications Medications  cefTRIAXone (ROCEPHIN) 1 g in sodium chloride 0.9 % 100 mL IVPB (1 g Intravenous Not Given 08/03/18 2356)  cefTRIAXone (ROCEPHIN) 1 g in sodium chloride 0.9 % 100 mL IVPB ( Intravenous Stopped 08/03/18 2112)  aspirin chewable tablet 324  mg (324 mg Oral Given 08/03/18 2149)     Mobility walks with person assist Low fall risk   Focused Assessments Neuro Assessment Handoff:  Swallow screen pass? Yes    NIH Stroke Scale ( + Modified Stroke Scale Criteria)  Level of Consciousness (1a.)   : Alert, keenly responsive LOC Questions (1b. )   +: Answers both questions correctly LOC Commands (1c. )   + : Performs both tasks correctly Best Gaze (2. )  +: Normal Visual (3. )  +: No visual loss Facial Palsy (4. )    : Normal symmetrical movements Motor Arm, Left (5a. )   +: No drift Motor Arm, Right (5b. )   +: Drift Motor Leg, Left (6a. )   +: No drift Motor Leg, Right (6b. )   +: No drift Limb Ataxia (7. ): Absent Sensory (8. )   +: Normal, no sensory loss Best Language (9. )   +: No aphasia Dysarthria (10. ): Normal Extinction/Inattention (11.)   +: No Abnormality Modified SS Total  +: 1 Complete NIHSS TOTAL: 1     Neuro Assessment: Exceptions to WDL Neuro Checks:      Last Documented NIHSS Modified Score: 1 (08/03/18 1842) Has TPA been given? No If patient is a Neuro Trauma and patient is going to OR before floor call report to 4N Charge nurse: [phone] or [phone]     R Recommendations: See Admitting Provider Note  Report given to:   Additional Notes:

## 2018-08-04 NOTE — Plan of Care (Signed)
  Problem: Education: Goal: Knowledge of disease or condition will improve Outcome: Progressing Goal: Knowledge of secondary prevention will improve Outcome: Progressing Goal: Knowledge of patient specific risk factors addressed and post discharge goals established will improve Outcome: Progressing Goal: Individualized Educational Video(s) Outcome: Progressing   Problem: Coping: Goal: Will verbalize positive feelings about self Outcome: Progressing Goal: Will identify appropriate support needs Outcome: Progressing   Problem: Health Behavior/Discharge Planning: Goal: Ability to manage health-related needs will improve Outcome: Progressing   Problem: Self-Care: Goal: Ability to participate in self-care as condition permits will improve Outcome: Progressing Goal: Verbalization of feelings and concerns over difficulty with self-care will improve Outcome: Progressing Goal: Ability to communicate needs accurately will improve Outcome: Progressing   Problem: Nutrition: Goal: Risk of aspiration will decrease Outcome: Progressing Goal: Dietary intake will improve Outcome: Progressing   Problem: Intracerebral Hemorrhage Tissue Perfusion: Goal: Complications of Intracerebral Hemorrhage will be minimized Outcome: Progressing   Problem: Ischemic Stroke/TIA Tissue Perfusion: Goal: Complications of ischemic stroke/TIA will be minimized Outcome: Progressing   Problem: Spontaneous Subarachnoid Hemorrhage Tissue Perfusion: Goal: Complications of Spontaneous Subarachnoid Hemorrhage will be minimized Outcome: Progressing   Problem: Education: Goal: Knowledge of General Education information will improve Description: Including pain rating scale, medication(s)/side effects and non-pharmacologic comfort measures Outcome: Progressing   Problem: Health Behavior/Discharge Planning: Goal: Ability to manage health-related needs will improve Outcome: Progressing   Problem: Clinical  Measurements: Goal: Ability to maintain clinical measurements within normal limits will improve Outcome: Progressing Goal: Will remain free from infection Outcome: Progressing Goal: Diagnostic test results will improve Outcome: Progressing Goal: Respiratory complications will improve Outcome: Progressing Goal: Cardiovascular complication will be avoided Outcome: Progressing   Problem: Activity: Goal: Risk for activity intolerance will decrease Outcome: Progressing   Problem: Nutrition: Goal: Adequate nutrition will be maintained Outcome: Progressing   Problem: Coping: Goal: Level of anxiety will decrease Outcome: Progressing   Problem: Elimination: Goal: Will not experience complications related to bowel motility Outcome: Progressing Goal: Will not experience complications related to urinary retention Outcome: Progressing   Problem: Pain Managment: Goal: General experience of comfort will improve Outcome: Progressing   Problem: Safety: Goal: Ability to remain free from injury will improve Outcome: Progressing   Problem: Skin Integrity: Goal: Risk for impaired skin integrity will decrease Outcome: Progressing   

## 2018-08-04 NOTE — Evaluation (Signed)
Occupational Therapy Evaluation Patient Details Name: Tami ArbourCynthia B Jones MRN: [id number] DOB: 06/15/1955 Today's Date: 08/04/2018    History of Present Illness 63 y.o. 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  She presented to the emergency room on 6/18 after weakness and decreased sensation of her right upper extremity which began on 08/02/2018 while at work. MRI (+) for  "Acute lacunar infarct in the left centrum semiovale.Chronic small vessel ischemia with remote right caudate lacunarinfarct.Small remote bilateral occipital infarcts."   Clinical Impression   Ms. Tami Jones was seen for OT evaluation this date. Prior to hospital admission, pt was independent in all aspects of ADL. Pt endorses working at [employer] prior to admission. Pt lives with her daughter and SIL with their children in a one-level home with 3 steps to enter at the back and 6 steps to enter at the front (primarily uses back). Currently pt demonstrates significant impairments in RUE strength, coordination, and sensation. Pt is R hand dominant and requires mod/max assist for assist for seated ADL and 1 person hand held assist for transfers due to decreased AROM of the LLE and LUE. Pt endorses mild sensory loss t/o L side including LUE and LLE which impacts her ability to complete ADLs. Pt is eager to return to her PLOF with increased independence and functional use of her R arm. Pt educated on safe positioning of RUE in order to promote functional return, improve safety, and promote skin integrity on this date. Pt was motivated t/o OT session and return demonstrated PROM exercises with VCs for technique. No cognitive or visual deficits noted with assessment on this date. Pt would benefit from skilled OT to address noted impairments and functional limitations (see below for any additional details) in order to maximize safety and  independence while minimizing falls risk and caregiver burden.  Upon hospital discharge, recommend pt discharge to CIR to maximize safety and return to PLOF.     Follow Up Recommendations  CIR    Equipment Recommendations  Other (comment)(TBD at next venue of care.)    Recommendations for Other Services Rehab consult     Precautions / Restrictions Precautions Precautions: Fall Restrictions Weight Bearing Restrictions: No      Mobility Bed Mobility Overal bed mobility: Modified Independent             General bed mobility comments: Increased time/effort  Transfers Overall transfer level: Modified independent Equipment used: 1 person hand held assist                  Balance Overall balance assessment: Needs assistance Sitting-balance support: Feet supported;Single extremity supported Sitting balance-Leahy Scale: Fair     Standing balance support: Single extremity supported;During functional activity Standing balance-Leahy Scale: Fair                             ADL either performed or assessed with clinical judgement   ADL Overall ADL's : Needs assistance/impaired Eating/Feeding: Minimal assistance;Sitting Eating/Feeding Details (indicate cue type and reason): Pt R hand dominant. Expressed significant difficulty cutting items on hospital meal tray. Unable to feed self with RUE. Feels very limited by LUE. Requires assist for cutting and set-up of tray items. Grooming: Set up;Minimal assistance;Sitting   Upper Body Bathing: Set up;Sitting;Moderate assistance   Lower Body Bathing: Set up;Moderate assistance;Sitting/lateral leans   Upper Body Dressing : Set up;Moderate assistance;Sitting  Lower Body Dressing: Set up;Moderate assistance;Sit to/from stand   Toilet Transfer: LawyerMin guard   Toileting- Clothing Manipulation and Hygiene: Moderate assistance;Sit to/from stand Toileting - Clothing Manipulation Details (indicate cue type and reason):  Pt with significant difficulty pulling up underwear on this date. Expresses difficulty with peri-care 2/2 RUE limitations.     Functional mobility during ADLs: Supervision/safety       Vision Baseline Vision/History: Wears glasses;Cataracts Wears Glasses: Reading only Patient Visual Report: No change from baseline Vision Assessment?: Vision impaired- to be further tested in functional context     Perception     Praxis      Pertinent Vitals/Pain Pain Assessment: No/denies pain     Hand Dominance Right   Extremity/Trunk Assessment Upper Extremity Assessment Upper Extremity Assessment: RUE deficits/detail RUE Deficits / Details: Grossly 2+/5; could not raise arm beyond midline against gravity. Significantly decreased FMC. Opposition very limited. Can only move thumb and index independently and still not WFL. No active grip strength. RUE Sensation: decreased light touch RUE Coordination: decreased fine motor;decreased gross motor   Lower Extremity Assessment Lower Extremity Assessment: Overall WFL for tasks assessed;RLE deficits/detail RLE Deficits / Details: pt able to stand independently, but cannot tolerate a challenge with hip/knee flexion/extension. limited dorsiflexion against gravity. Decreased sensation. RLE Sensation: decreased light touch RLE Coordination: decreased fine motor;decreased gross motor   Cervical / Trunk Assessment Cervical / Trunk Assessment: Normal   Communication Communication Communication: No difficulties   Cognition Arousal/Alertness: Awake/alert Behavior During Therapy: WFL for tasks assessed/performed Overall Cognitive Status: Within Functional Limits for tasks assessed                                 General Comments: Pt tearful at times. Expressed worries over "never getting my arm back".   General Comments       Exercises Other Exercises Other Exercises: Pt educated in falls prevention strategies, safe use of AE for  ADLs, as well as safe positioning and ROM for RUE to maximize functional return and skin integrity. Other Exercises: Pt assisted with donning hospital provided underwear. Required mod asssist and cues for safety t/o.   Shoulder Instructions      Home Living Family/patient expects to be discharged to:: Private residence Living Arrangements: Children(Dtr, SIL, grand children.) Available Help at Discharge: Family;Available 24 hours/day(Daugther home with children.) Type of Home: House Home Access: Stairs to enter Entergy CorporationEntrance Stairs-Number of Steps: 3   Home Layout: One level     Bathroom Shower/Tub: Tub only   FirefighterBathroom Toilet: Standard     Home Equipment: None          Prior Functioning/Environment Level of Independence: Independent        Comments: Working full time at [employer]. Driving. Assisting with grandchildren.        OT Problem List: Decreased strength;Decreased coordination;Decreased range of motion;Impaired sensation;Decreased activity tolerance;Decreased safety awareness;Impaired balance (sitting and/or standing)      OT Treatment/Interventions: Self-care/ADL training;Balance training;Therapeutic exercise;Therapeutic activities;Neuromuscular education;DME and/or AE instruction;Patient/family education;Manual therapy;Visual/perceptual remediation/compensation    OT Goals(Current goals can be found in the care plan section) Acute Rehab OT Goals Patient Stated Goal: To get my right arm back OT Goal Formulation: With patient Time For Goal Achievement: 08/18/18 Potential to Achieve Goals: Good ADL Goals Pt Will Perform Eating: with set-up;sitting;with adaptive utensils(With LRAD for improved safety and functional independence.) Pt Will Perform Grooming: sitting;with adaptive equipment;with set-up(With LRAD for improved safety and  functional independence.) Pt Will Perform Upper Body Dressing: with modified independence;sitting;with adaptive equipment(With LRAD for  improved safety and functional independence.) Pt Will Perform Lower Body Dressing: with adaptive equipment;with supervision;sit to/from stand(With LRAD for improved safety and functional independence.)  OT Frequency: Min 3X/week   Barriers to D/C: Inaccessible home environment  Pt with steps to enter the home.       Co-evaluation              AM-PAC OT "6 Clicks" Daily Activity     Outcome Measure Help from another person eating meals?: A Little Help from another person taking care of personal grooming?: A Little Help from another person toileting, which includes using toliet, bedpan, or urinal?: A Little Help from another person bathing (including washing, rinsing, drying)?: A Little Help from another person to put on and taking off regular upper body clothing?: A Little Help from another person to put on and taking off regular lower body clothing?: A Little 6 Click Score: 18   End of Session Equipment Utilized During Treatment: Gait belt  Activity Tolerance: Patient tolerated treatment well Patient left: in bed;with call bell/phone within reach;with bed alarm set  OT Visit Diagnosis: Unsteadiness on feet (R26.81);Hemiplegia and hemiparesis Hemiplegia - Right/Left: Right Hemiplegia - dominant/non-dominant: Dominant Hemiplegia - caused by: Cerebral infarction                Time: 1405-1440 OT Time Calculation (min): 35 min Charges:  OT General Charges $OT Visit: 1 Visit OT Evaluation $OT Eval Low Complexity: 1 Low OT Treatments $Self Care/Home Management : 23-37 mins  Shara Blazing, M.S., OTR/L Ascom: [phone] 08/04/18, 3:52 PM

## 2018-08-04 NOTE — Progress Notes (Signed)
Ativan carried to MRI department for administration. MRI already in progress. Pt tolerating.

## 2018-08-04 NOTE — Progress Notes (Signed)
Rehab Admissions Coordinator Note:  Patient was screened by Michel Santee, PT, DPT for appropriateness for an Inpatient Acute Rehab Consult.  Note pt mod I with mobility with OT, and no SLP needs.  Pt declined PT.  At this time, pt too high level for CIR level therapies. Recommend home with [facility] f/u if needed.  Shann Medal, PT, DPT Admissions Coordinator [phone] 08/04/18  4:07 PM

## 2018-08-04 NOTE — Progress Notes (Signed)
*  PRELIMINARY RESULTS* Echocardiogram 2D Echocardiogram has been performed.  Sherrie Sport 08/04/2018, 2:00 PM

## 2018-08-04 NOTE — Consult Note (Signed)
Reason for Consult: stroke Referring Physician: Dr. Stark Jock  CC: Tami Jones  HPI:  63 y.o. 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  She presented to the emergency room with complaint of numbness, weakness, and decreased dexterity of her right hand and upper extremity.  Patient noted weakness and decreased sensation of her right upper extremity on 08/02/2018 while at work.  Patient works in a lab.  She began to notice numbness in her fingers of her right hand and became unable to write with progressive weakness and heaviness of her right upper extremity.  She has also noted nausea however no vomiting.  She denies facial drooping or slurred speech.  She denies weakness of lower extremities.  She denies difficulty ambulating.  She denies dizziness or headache.  She denies changes in vision or diplopia.  She denies dysphagia.  She denies chest pain, fever, chills, vomiting.  She denies abdominal pain.  Patient has no known history of DVT or pulmonary embolism.  She denies recent adjustments in her medications.  Additionally, patient also reports shortness of breath present for about 2 weeks which is present with exertion.  She saw her pulmonologist recently on 07/31/18 with echocardiogram demonstrating 55% ejection fraction. .  CT brain on arrival demonstrated no acute intracranial abnormality.  COVID-19 testing is pending once.  D-dimer is 340.19.  Urinalysis demonstrates trace leukocytes, 3+ bacteria, 11-20 WBCs.  Head ct om 6/18: Old bilateral occipital and bilateral basal ganglia infarcts. No acute intracranial abnormality.  Mri 6/18: Acute lacunar infarct in the left centrum semiovale.Chronic small vessel ischemia with remote right caudate lacunarinfarct.Small remote bilateral occipital infarcts.  MRA 6/18: Minimal flow in the right vertebral artery until the V3 segment.Robust flow  in the left cervical vertebral artery but tandemhigh-grade stenoses of the left V4 segment and high-grade narrowingat the right vertebrobasilar junction. Moderate proximal basilarstenosis.Smooth and widely patent cervical carotids.    Past Medical History:  Diagnosis Date  . Anxiety   . Asthma   . CVA (cerebral vascular accident) ([place]) 03/2006   right occipital, Dr. Doy Mince  . Diabetes mellitus   . GERD (gastroesophageal reflux disease)   . Hyperlipidemia   . Hypertension     Past Surgical History:  Procedure Laterality Date  . CARDIOVASCULAR STRESS TEST  1/15   myoview. EF 60%  . CESAREAN SECTION    . ESOPHAGOGASTRODUODENOSCOPY  05/2005  . TONSILLECTOMY AND ADENOIDECTOMY      Family History  Problem Relation Age of Onset  . Coronary artery disease Mother   . Diabetes Mellitus II Mother   . Heart attack Mother 57  . Diabetes Mellitus II Maternal Grandmother     Social History:  reports that she has never smoked. She has never used smokeless tobacco. She reports that she does not drink alcohol or use drugs.  Allergies  Allergen Reactions  . Citalopram Other (See Comments)    Feels odd  . Doxycycline Hyclate Nausea And Vomiting  . Erythromycin Other (See Comments)    Irritated stomach  . Montelukast Sodium Itching  . Nitrofurantoin Nausea And Vomiting  . Sulfa Antibiotics Nausea And Vomiting  . Tramadol Hcl Other (See Comments)    Feels odd  . Venlafaxine Hcl Other (See Comments)    Feels odd  . Amoxicillin-Pot Clavulanate Nausea And Vomiting  . Clarithromycin Rash     See ER note 02/2017    Medications: I have reviewed the patient's  current medications.  ROS: As per HPI Physical Examination: Blood pressure (!) 169/91, pulse 95, temperature 98.1 F (36.7 C), temperature source Oral, resp. rate 20, height  (1.6 m), weight 103.9 kg, SpO2 97 %.  Neurologic Examination Patient alert, awake, oriented x3, speech nle, follows commands CN: PERRLA, EOMI,  VFF, no nystagmus, face symmetrical, face sensation nle, palate and tongue midline Motor:  5/5 on biceps/triceps, wriste flexion/extension on the left arm. 5/5 on hip flexion/extension, knee flexion/extension, plantar flexion/extension on left leg 4/5 on biceps/triceps, wrist flexion/extension on right arm. 5/5 . 5/5 on hip flexion/extension, knee f;exion/extension, plantar flexion/extension on right leg No sensory deficit appreciated No coordination deficit appreciated dtr and   Gait not assessed at the time of examination  Results for orders placed or performed during the hospital encounter of 08/03/18 (from the past 48 hour(s))  Basic metabolic panel     Status: Abnormal   Collection Time: 08/03/18  1:38 PM  Result Value Ref Range   Sodium 138 135 - 145 mmol/L   Potassium 3.6 3.5 - 5.1 mmol/L   Chloride 102 98 - 111 mmol/L   CO2 25 22 - 32 mmol/L   Glucose, Bld 264 (H) 70 - 99 mg/dL   BUN 16 8 - 23 mg/dL   Creatinine, Ser 4.09 (H) 0.44 - 1.00 mg/dL   Calcium 9.3 8.9 - 81.1 mg/dL   GFR calc non Af Amer 57 (L) >60 mL/min   GFR calc Af Amer >60 >60 mL/min   Anion gap 11 5 - 15    Comment: Performed at [facility], [address] [place], [place] [address]  CBC     Status: None   Collection Time: 08/03/18  1:38 PM  Result Value Ref Range   WBC 6.0 4.0 - 10.5 K/uL   RBC 4.61 3.87 - 5.11 MIL/uL   Hemoglobin 14.1 12.0 - 15.0 g/dL   HCT 29.5 62.1 - 30.8 %   MCV 90.9 80.0 - 100.0 fL   MCH 30.6 26.0 - 34.0 pg   MCHC 33.7 30.0 - 36.0 g/dL   RDW 65.7 84.6 - 96.2 %   Platelets 224 150 - 400 K/uL   nRBC 0.0 0.0 - 0.2 %    Comment: Performed at [facility], [address] [place], [place] [address]  CBC with Differential/Platelet     Status: None   Collection Time: 08/03/18  1:38 PM  Result Value Ref Range   WBC 5.9 4.0 - 10.5 K/uL   RBC 4.60 3.87 - 5.11 MIL/uL   Hemoglobin 14.1 12.0 - 15.0 g/dL   HCT 13.2 44.0 - 10.2 %   MCV 91.7 80.0 - 100.0 fL   MCH 30.7  26.0 - 34.0 pg   MCHC 33.4 30.0 - 36.0 g/dL   RDW 72.5 36.6 - 44.0 %   Platelets 248 150 - 400 K/uL   nRBC 0.0 0.0 - 0.2 %   Neutrophils Relative % 62 %   Neutro Abs 3.7 1.7 - 7.7 K/uL   Lymphocytes Relative 33 %   Lymphs Abs 1.9 0.7 - 4.0 K/uL   Monocytes Relative 4 %   Monocytes Absolute 0.3 0.1 - 1.0 K/uL   Eosinophils Relative 1 %   Eosinophils Absolute 0.0 0.0 - 0.5 K/uL   Basophils Relative 0 %   Basophils Absolute 0.0 0.0 - 0.1 K/uL   Immature Granulocytes 0 %   Abs Immature Granulocytes 0.01 0.00 - 0.07 K/uL    Comment: Performed at  [facility], [address] [place], [place] [address]  Urinalysis, Complete w Microscopic     Status: Abnormal   Collection Time: 08/03/18  7:27 PM  Result Value Ref Range   Color, Urine YELLOW (A) YELLOW   APPearance HAZY (A) CLEAR   Specific Gravity, Urine 1.014 1.005 - 1.030   pH 6.0 5.0 - 8.0   Glucose, UA >=500 (A) NEGATIVE mg/dL   Hgb urine dipstick NEGATIVE NEGATIVE   Bilirubin Urine NEGATIVE NEGATIVE   Ketones, ur NEGATIVE NEGATIVE mg/dL   Protein, ur NEGATIVE NEGATIVE mg/dL   Nitrite NEGATIVE NEGATIVE   Leukocytes,Ua TRACE (A) NEGATIVE   RBC / HPF 0-5 0 - 5 RBC/hpf   WBC, UA 11-20 0 - 5 WBC/hpf   Bacteria, UA MANY (A) NONE SEEN   Squamous Epithelial / LPF 0-5 0 - 5    Comment: Performed at [facility], [address] [place], [place] [address]  Ethanol     Status: None   Collection Time: 08/03/18  7:27 PM  Result Value Ref Range   Alcohol, Ethyl (B) <10 <10 mg/dL    Comment: (NOTE) Lowest detectable limit for serum alcohol is 10 mg/dL. For medical purposes only. Performed at [facility], [address] [place], [place] [address]   Protime-INR     Status: None   Collection Time: 08/03/18  7:27 PM  Result Value Ref Range   Prothrombin Time 11.8 11.4 - 15.2 seconds   INR 0.9 0.8 - 1.2    Comment: (NOTE) INR goal varies based on device and disease states. Performed at [facility], [address] [place], [place] [address]   APTT     Status: None   Collection Time: 08/03/18  7:27 PM  Result Value Ref Range   aPTT 28 24 - 36 seconds    Comment: Performed at [facility], [address] [place], [place] [address]  Urine Drug Screen, Qualitative     Status: None   Collection Time: 08/03/18  7:27 PM  Result Value Ref Range   Tricyclic, Ur Screen NONE DETECTED NONE DETECTED   Amphetamines, Ur Screen NONE DETECTED NONE DETECTED   MDMA (Ecstasy)Ur Screen NONE DETECTED NONE DETECTED   Cocaine Metabolite,Ur [place] NONE DETECTED NONE DETECTED   Opiate, Ur Screen NONE DETECTED NONE DETECTED   Phencyclidine (PCP) Ur S NONE DETECTED NONE DETECTED   Cannabinoid 50 Ng, Ur [place] NONE DETECTED NONE DETECTED   Barbiturates, Ur Screen NONE DETECTED NONE DETECTED   Benzodiazepine, Ur Scrn NONE DETECTED NONE DETECTED   Methadone Scn, Ur NONE DETECTED NONE DETECTED    Comment: (NOTE) Tricyclics + metabolites, urine    Cutoff 1000 ng/mL Amphetamines + metabolites, urine  Cutoff 1000 ng/mL MDMA (Ecstasy), urine              Cutoff 500 ng/mL Cocaine Metabolite, urine          Cutoff 300 ng/mL Opiate + metabolites, urine        Cutoff 300 ng/mL Phencyclidine (PCP), urine         Cutoff 25 ng/mL Cannabinoid, urine                 Cutoff 50 ng/mL Barbiturates + metabolites, urine  Cutoff 200 ng/mL Benzodiazepine, urine              Cutoff 200 ng/mL Methadone, urine  Cutoff 300 ng/mL The urine drug screen provides only a preliminary, unconfirmed analytical test result and should not be used for non-medical purposes. Clinical consideration and professional judgment should be applied to any positive drug screen result due to possible interfering substances. A more specific alternate chemical method must be used in order to obtain a confirmed analytical result. Gas chromatography / mass spectrometry (GC/MS) is the preferred confirmat ory method. Performed  at [facility], [address] [place], [place] [address]   Comprehensive metabolic panel     Status: Abnormal   Collection Time: 08/03/18  7:27 PM  Result Value Ref Range   Sodium 138 135 - 145 mmol/L   Potassium 3.8 3.5 - 5.1 mmol/L   Chloride 101 98 - 111 mmol/L   CO2 26 22 - 32 mmol/L   Glucose, Bld 213 (H) 70 - 99 mg/dL   BUN 18 8 - 23 mg/dL   Creatinine, Ser 8.111.01 (H) 0.44 - 1.00 mg/dL   Calcium 9.4 8.9 - 91.410.3 mg/dL   Total Protein 7.6 6.5 - 8.1 g/dL   Albumin 4.3 3.5 - 5.0 g/dL   AST 25 15 - 41 U/L   ALT 31 0 - 44 U/L   Alkaline Phosphatase 104 38 - 126 U/L   Total Bilirubin 0.5 0.3 - 1.2 mg/dL   GFR calc non Af Amer >60 >60 mL/min   GFR calc Af Amer >60 >60 mL/min   Anion gap 11 5 - 15    Comment: Performed at [facility], [address] [place], [place] [address]  Fibrin derivatives D-Dimer [facility] only)     Status: None   Collection Time: 08/04/18  2:09 AM  Result Value Ref Range   Fibrin derivatives D-dimer ([facility]) 340.19 0.00 - 499.00 ng/mL (FEU)    Comment: (NOTE) <> Exclusion of Venous Thromboembolism (VTE) - OUTPATIENT ONLY   (Emergency Department or [place])   0-499 ng/ml (FEU): With a low to intermediate pretest probability                      for VTE this test result excludes the diagnosis                      of VTE.   >499 ng/ml (FEU) : VTE not excluded; additional work up for VTE is                      required. <> Testing on Inpatients and Evaluation of Disseminated Intravascular   Coagulation (DIC) Reference Range:   0-499 ng/ml (FEU) Performed at [facility], [address] [place], [place] [address]   Lipid panel     Status: Abnormal   Collection Time: 08/04/18  2:09 AM  Result Value Ref Range   Cholesterol 226 (H) 0 - 200 mg/dL   Triglycerides 865152 (H) <150 mg/dL   HDL 36 (L) >78>40 mg/dL   Total CHOL/HDL Ratio 6.3 RATIO   VLDL 30 0 - 40 mg/dL   LDL Cholesterol 469160 (H) 0 - 99 mg/dL    Comment:        Total  Cholesterol/HDL:CHD Risk Coronary Heart Disease Risk Table                     Men   Women  1/2 Average Risk   3.4   3.3  Average Risk       5.0   4.4  2  X Average Risk   9.6   7.1  3 X Average Risk  23.4   11.0        Use the calculated Patient Ratio above and the CHD Risk Table to determine the patient's CHD Risk.        ATP III CLASSIFICATION (LDL):  <100     mg/dL   Optimal  161-096  mg/dL   Near or Above                    Optimal  130-159  mg/dL   Borderline  045-409  mg/dL   High  >811     mg/dL   Very High Performed at [facility], [address] [place], [place] [address]   CBC     Status: None   Collection Time: 08/04/18  2:09 AM  Result Value Ref Range   WBC 6.3 4.0 - 10.5 K/uL   RBC 4.51 3.87 - 5.11 MIL/uL   Hemoglobin 13.9 12.0 - 15.0 g/dL   HCT 29.5 62.1 - 30.8 %   MCV 91.1 80.0 - 100.0 fL   MCH 30.8 26.0 - 34.0 pg   MCHC 33.8 30.0 - 36.0 g/dL   RDW 65.7 84.6 - 96.2 %   Platelets 224 150 - 400 K/uL   nRBC 0.0 0.0 - 0.2 %    Comment: Performed at [facility], [address] [place], [place] [address]  Creatinine, serum     Status: Abnormal   Collection Time: 08/04/18  2:09 AM  Result Value Ref Range   Creatinine, Ser 1.16 (H) 0.44 - 1.00 mg/dL   GFR calc non Af Amer 51 (L) >60 mL/min   GFR calc Af Amer 59 (L) >60 mL/min    Comment: Performed at [facility], [address] [place], [place] [address]  Glucose, capillary     Status: Abnormal   Collection Time: 08/04/18  7:54 AM  Result Value Ref Range   Glucose-Capillary 184 (H) 70 - 99 mg/dL    No results found for this or any previous visit (from the past 240 hour(s)).  Dg Chest 2 View  Result Date: 08/04/2018 CLINICAL DATA:  CVA. EXAM: CHEST - 2 VIEW COMPARISON:  04/13/2018 FINDINGS: Upper normal heart size.The cardiomediastinal contours are normal. The lungs are clear. Pulmonary vasculature is normal. No consolidation, pleural effusion, or pneumothorax. No acute  osseous abnormalities are seen. IMPRESSION: No acute chest findings. Electronically Signed   By: Narda Rutherford M.D.   On: 08/04/2018 03:16   Ct Head Wo Contrast  Result Date: 08/03/2018 CLINICAL DATA:  Cyst 6 right hand numbness, weakness EXAM: CT HEAD WITHOUT CONTRAST TECHNIQUE: Contiguous axial images were obtained from the base of the skull through the vertex without intravenous contrast. COMPARISON:  MRI 01/03/2016 FINDINGS: Brain: Old bilateral occipital infarcts and bilateral basal ganglia lacunar infarcts, stable. No acute intracranial abnormality. Specifically, no hemorrhage, hydrocephalus, mass lesion, acute infarction, or significant intracranial injury. Vascular: No hyperdense vessel or unexpected calcification. Skull: No acute calvarial abnormality. Sinuses/Orbits: Visualized paranasal sinuses and mastoids clear. Orbital soft tissues unremarkable. 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 No acute intracranial abnormality. Electronically Signed   By: Charlett Nose M.D.   On: 08/03/2018 19:34   Mr Maxine Glenn Neck W Wo Contrast  Result Date: 08/04/2018 CLINICAL DATA:  Cerebral hemorrhage suspected. Right hand numbness/weakness. EXAM: MRI HEAD WITHOUT CONTRAST MRA NECK WITHOUT AND WITH CONTRAST TECHNIQUE: Multiplanar, multiecho pulse sequences of  the brain and surrounding structures were obtained without intravenous contrast. Angiographic images of the neck were obtained using MRA technique with and without intravenous contrast. Carotid stenosis measurements (when applicable) are obtained utilizing NASCET criteria, using the distal internal carotid diameter as the denominator. CONTRAST:  Dose is not currently availabledue to EPIC server failure COMPARISON:  Head CT from yesterday FINDINGS: MRI HEAD FINDINGS Brain: 11 mm acute infarct in the left centrum semiovale which correlates with symptoms. There is patchy FLAIR hyperintensity in the cerebral white matter  with thin confluent rim around the lateral ventricles that is attributed to chronic microvascular ischemia given constellation of findings. A remote lacunar infarct is present at the right caudate body. Small remote bilateral occipital cortex infarcts that are notably symmetric, question prior posterior reversible encephalopathy syndrome-although there is posterior circulation stenotic disease as described below. No acute or chronic blood products, hydrocephalus, masslike finding, or atrophy Vascular: Major flow voids are preserved Skull and upper cervical spine: Negative for marrow lesion Sinuses/Orbits: Negative MRA NECK FINDINGS Noncontrast mask shows no gross mass or swelling. There is antegrade flow in both carotid and the left vertebral arteries. No meaningful flow is seen within the right vertebral artery until the V3 segment. There is robust flow in the cervical left vertebral artery followed by tandem high-grade stenoses of the left V4 segment. There is a moderate narrowing of the proximal basilar. Likely high-grade narrowing at the right vertebrobasilar junction. Nonvisualized right proximal vertebral artery was noted on chest CT from 02/24/2018-this is likely the non dominant side. No stenosis or ulceration seen in the carotids. IMPRESSION: Brain MRI: 1. Acute lacunar infarct in the left centrum semiovale. 2. Chronic small vessel ischemia with remote right caudate lacunar infarct. 3. Small remote bilateral occipital infarcts. Neck MRA: 1. Minimal flow in the right vertebral artery until the V3 segment. 2. Robust flow in the left cervical vertebral artery but tandem high-grade stenoses of the left V4 segment and high-grade narrowing at the right vertebrobasilar junction. Moderate proximal basilar stenosis. 3. Smooth and widely patent cervical carotids. Electronically Signed   By: Marnee SpringJonathon  Watts M.D.   On: 08/04/2018 06:48   Mr Brain Wo Contrast  Result Date: 08/04/2018 CLINICAL DATA:  Cerebral  hemorrhage suspected. Right hand numbness/weakness. EXAM: MRI HEAD WITHOUT CONTRAST MRA NECK WITHOUT AND WITH CONTRAST TECHNIQUE: Multiplanar, multiecho pulse sequences of the brain and surrounding structures were obtained without intravenous contrast. Angiographic images of the neck were obtained using MRA technique with and without intravenous contrast. Carotid stenosis measurements (when applicable) are obtained utilizing NASCET criteria, using the distal internal carotid diameter as the denominator. CONTRAST:  Dose is not currently availabledue to EPIC server failure COMPARISON:  Head CT from yesterday FINDINGS: MRI HEAD FINDINGS Brain: 11 mm acute infarct in the left centrum semiovale which correlates with symptoms. There is patchy FLAIR hyperintensity in the cerebral white matter with thin confluent rim around the lateral ventricles that is attributed to chronic microvascular ischemia given constellation of findings. A remote lacunar infarct is present at the right caudate body. Small remote bilateral occipital cortex infarcts that are notably symmetric, question prior posterior reversible encephalopathy syndrome-although there is posterior circulation stenotic disease as described below. No acute or chronic blood products, hydrocephalus, masslike finding, or atrophy Vascular: Major flow voids are preserved Skull and upper cervical spine: Negative for marrow lesion Sinuses/Orbits: Negative MRA NECK FINDINGS Noncontrast mask shows no gross mass or swelling. There is antegrade flow in both carotid and the left vertebral arteries. No  meaningful flow is seen within the right vertebral artery until the V3 segment. There is robust flow in the cervical left vertebral artery followed by tandem high-grade stenoses of the left V4 segment. There is a moderate narrowing of the proximal basilar. Likely high-grade narrowing at the right vertebrobasilar junction. Nonvisualized right proximal vertebral artery was noted on  chest CT from 02/24/2018-this is likely the non dominant side. No stenosis or ulceration seen in the carotids. IMPRESSION: Brain MRI: 1. Acute lacunar infarct in the left centrum semiovale. 2. Chronic small vessel ischemia with remote right caudate lacunar infarct. 3. Small remote bilateral occipital infarcts. Neck MRA: 1. Minimal flow in the right vertebral artery until the V3 segment. 2. Robust flow in the left cervical vertebral artery but tandem high-grade stenoses of the left V4 segment and high-grade narrowing at the right vertebrobasilar junction. Moderate proximal basilar stenosis. 3. Smooth and widely patent cervical carotids. Electronically Signed   By: Marnee SpringJonathon  Watts M.D.   On: 08/04/2018 06:48     Assessment/Plan: 63 y.o. 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 Can be dced if medically stable   Earnestine LeysGyuerch, MD neurology 08/04/2018, 9:21 AM

## 2018-08-04 NOTE — Progress Notes (Signed)
[employer] [facility] - [place] at [facility]   PATIENT NAME: Tonna CornerCynthia Haydon    MR#:  [id number]  DATE OF BIRTH:  03/27/1955  SUBJECTIVE:  CHIEF COMPLAINT:   Chief Complaint  Patient presents with   Numbness    right hand    Weakness   No new complaints.  No fevers.  Right-sided weakness, predominantly right upper extremity with gradual improvement.  No chest pain.  REVIEW OF SYSTEMS:  Review of Systems  Constitutional: Negative for chills and fever.  HENT: Negative for hearing loss and tinnitus.   Eyes: Negative for blurred vision and double vision.  Respiratory: Negative for cough and sputum production.   Cardiovascular: Negative for chest pain and palpitations.  Gastrointestinal: Negative for abdominal pain, heartburn, nausea and vomiting.  Genitourinary: Negative for dysuria.  Musculoskeletal:       Right-sided weakness and numbness  Skin: Negative for itching and rash.  Neurological: Negative for headaches.       Right-sided weakness and numbness  Psychiatric/Behavioral: Negative for depression and hallucinations.    DRUG ALLERGIES:   Allergies  Allergen Reactions   Citalopram Other (See Comments)    Feels odd   Doxycycline Hyclate Nausea And Vomiting   Erythromycin Other (See Comments)    Irritated stomach   Montelukast Sodium Itching   Nitrofurantoin Nausea And Vomiting   Sulfa Antibiotics Nausea And Vomiting   Tramadol Hcl Other (See Comments)    Feels odd   Venlafaxine Hcl Other (See Comments)    Feels odd   Amoxicillin-Pot Clavulanate Nausea And Vomiting   Clarithromycin Rash     See ER note 02/2017   VITALS:  Blood pressure 137/81, pulse 79, temperature 98.5 F (36.9 C), temperature source Oral, resp. rate 18, height 5\' 3"  (1.6 m), weight 103.9 kg, SpO2 98 %. PHYSICAL EXAMINATION:   Physical Exam  Constitutional: She is oriented to person, place, and time. She appears well-developed and well-nourished.  HENT:  Head:  Normocephalic and atraumatic.  Eyes: Pupils are equal, round, and reactive to light. Conjunctivae are normal. Right eye exhibits no discharge.  Neck: Normal range of motion. No tracheal deviation present.  Cardiovascular: Normal rate, regular rhythm and normal heart sounds.  Respiratory: Effort normal and breath sounds normal. No respiratory distress.  GI: Soft. Bowel sounds are normal. She exhibits no distension.  Musculoskeletal: Normal range of motion.        General: No edema.  Neurological: She is alert and oriented to person, place, and time.  Strength of 4/5 in right upper extremity.  4-5 over 5 on right lower extremity.  Decreased sensation in right upper extremity.  Strength of 5/5 on the left side.  Skin: She is not diaphoretic.   LABORATORY PANEL:  Female CBC Recent Labs  Lab 08/04/18 0209  WBC 6.3  HGB 13.9  HCT 41.1  PLT 224   ------------------------------------------------------------------------------------------------------------------ Chemistries  Recent Labs  Lab 08/03/18 1927 08/04/18 0209  NA 138  --   K 3.8  --   CL 101  --   CO2 26  --   GLUCOSE 213*  --   BUN 18  --   CREATININE 1.01* 1.16*  CALCIUM 9.4  --   AST 25  --   ALT 31  --   ALKPHOS 104  --   BILITOT 0.5  --    RADIOLOGY:  Dg Chest 2 View  Result Date: 08/04/2018 CLINICAL DATA:  CVA. EXAM: CHEST - 2 VIEW COMPARISON:  04/13/2018 FINDINGS: Upper  normal heart size.The cardiomediastinal contours are normal. The lungs are clear. Pulmonary vasculature is normal. No consolidation, pleural effusion, or pneumothorax. No acute osseous abnormalities are seen. IMPRESSION: No acute chest findings. Electronically Signed   By: Keith Rake M.D.   On: 08/04/2018 03:16   Ct Head Wo Contrast  Result Date: 08/03/2018 CLINICAL DATA:  Cyst 6 right hand numbness, weakness EXAM: CT HEAD WITHOUT CONTRAST TECHNIQUE: Contiguous axial images were obtained from the base of the skull through the vertex without  intravenous contrast. COMPARISON:  MRI 01/03/2016 FINDINGS: Brain: Old bilateral occipital infarcts and bilateral basal ganglia lacunar infarcts, stable. No acute intracranial abnormality. Specifically, no hemorrhage, hydrocephalus, mass lesion, acute infarction, or significant intracranial injury. Vascular: No hyperdense vessel or unexpected calcification. Skull: No acute calvarial abnormality. Sinuses/Orbits: Visualized paranasal sinuses and mastoids clear. Orbital soft tissues unremarkable. 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 No acute intracranial abnormality. Electronically Signed   By: Rolm Baptise M.D.   On: 08/03/2018 19:34   Mr Jodene Nam Neck W Wo Contrast  Result Date: 08/04/2018 CLINICAL DATA:  Cerebral hemorrhage suspected. Right hand numbness/weakness. EXAM: MRI HEAD WITHOUT CONTRAST MRA NECK WITHOUT AND WITH CONTRAST TECHNIQUE: Multiplanar, multiecho pulse sequences of the brain and surrounding structures were obtained without intravenous contrast. Angiographic images of the neck were obtained using MRA technique with and without intravenous contrast. Carotid stenosis measurements (when applicable) are obtained utilizing NASCET criteria, using the distal internal carotid diameter as the denominator. CONTRAST:  Dose is not currently availabledue to EPIC server failure COMPARISON:  Head CT from yesterday FINDINGS: MRI HEAD FINDINGS Brain: 11 mm acute infarct in the left centrum semiovale which correlates with symptoms. There is patchy FLAIR hyperintensity in the cerebral white matter with thin confluent rim around the lateral ventricles that is attributed to chronic microvascular ischemia given constellation of findings. A remote lacunar infarct is present at the right caudate body. Small remote bilateral occipital cortex infarcts that are notably symmetric, question prior posterior reversible encephalopathy syndrome-although there is posterior circulation  stenotic disease as described below. No acute or chronic blood products, hydrocephalus, masslike finding, or atrophy Vascular: Major flow voids are preserved Skull and upper cervical spine: Negative for marrow lesion Sinuses/Orbits: Negative MRA NECK FINDINGS Noncontrast mask shows no gross mass or swelling. There is antegrade flow in both carotid and the left vertebral arteries. No meaningful flow is seen within the right vertebral artery until the V3 segment. There is robust flow in the cervical left vertebral artery followed by tandem high-grade stenoses of the left V4 segment. There is a moderate narrowing of the proximal basilar. Likely high-grade narrowing at the right vertebrobasilar junction. Nonvisualized right proximal vertebral artery was noted on chest CT from 02/24/2018-this is likely the non dominant side. No stenosis or ulceration seen in the carotids. IMPRESSION: Brain MRI: 1. Acute lacunar infarct in the left centrum semiovale. 2. Chronic small vessel ischemia with remote right caudate lacunar infarct. 3. Small remote bilateral occipital infarcts. Neck MRA: 1. Minimal flow in the right vertebral artery until the V3 segment. 2. Robust flow in the left cervical vertebral artery but tandem high-grade stenoses of the left V4 segment and high-grade narrowing at the right vertebrobasilar junction. Moderate proximal basilar stenosis. 3. Smooth and widely patent cervical carotids. Electronically Signed   By: Monte Fantasia M.D.   On: 08/04/2018 06:48   Mr Brain Wo Contrast  Result Date: 08/04/2018 CLINICAL DATA:  Cerebral hemorrhage suspected. Right hand numbness/weakness. EXAM: MRI  HEAD WITHOUT CONTRAST MRA NECK WITHOUT AND WITH CONTRAST TECHNIQUE: Multiplanar, multiecho pulse sequences of the brain and surrounding structures were obtained without intravenous contrast. Angiographic images of the neck were obtained using MRA technique with and without intravenous contrast. Carotid stenosis measurements  (when applicable) are obtained utilizing NASCET criteria, using the distal internal carotid diameter as the denominator. CONTRAST:  Dose is not currently availabledue to EPIC server failure COMPARISON:  Head CT from yesterday FINDINGS: MRI HEAD FINDINGS Brain: 11 mm acute infarct in the left centrum semiovale which correlates with symptoms. There is patchy FLAIR hyperintensity in the cerebral white matter with thin confluent rim around the lateral ventricles that is attributed to chronic microvascular ischemia given constellation of findings. A remote lacunar infarct is present at the right caudate body. Small remote bilateral occipital cortex infarcts that are notably symmetric, question prior posterior reversible encephalopathy syndrome-although there is posterior circulation stenotic disease as described below. No acute or chronic blood products, hydrocephalus, masslike finding, or atrophy Vascular: Major flow voids are preserved Skull and upper cervical spine: Negative for marrow lesion Sinuses/Orbits: Negative MRA NECK FINDINGS Noncontrast mask shows no gross mass or swelling. There is antegrade flow in both carotid and the left vertebral arteries. No meaningful flow is seen within the right vertebral artery until the V3 segment. There is robust flow in the cervical left vertebral artery followed by tandem high-grade stenoses of the left V4 segment. There is a moderate narrowing of the proximal basilar. Likely high-grade narrowing at the right vertebrobasilar junction. Nonvisualized right proximal vertebral artery was noted on chest CT from 02/24/2018-this is likely the non dominant side. No stenosis or ulceration seen in the carotids. IMPRESSION: Brain MRI: 1. Acute lacunar infarct in the left centrum semiovale. 2. Chronic small vessel ischemia with remote right caudate lacunar infarct. 3. Small remote bilateral occipital infarcts. Neck MRA: 1. Minimal flow in the right vertebral artery until the V3 segment.  2. Robust flow in the left cervical vertebral artery but tandem high-grade stenoses of the left V4 segment and high-grade narrowing at the right vertebrobasilar junction. Moderate proximal basilar stenosis. 3. Smooth and widely patent cervical carotids. Electronically Signed   By: Marnee SpringJonathon  Watts M.D.   On: 08/04/2018 06:48   ASSESSMENT AND PLAN:    1.  Acute  CVA Patient presented with right-sided weakness and numbness predominantly on the right upper extremity. MRI of the brain revealed acute lacunar infarct in the left centrum semiovale MRA neck reviewed minimal flow in the right vertebral artery until the V3 segment. Robust flow in the left cervical vertebral artery but tandem high-grade stenoses of the left V4 segment and high-grade narrowing at the right vertebrobasilar junction. Moderate proximal basilar stenosis. Patient already seen by neurologist.  Follow-up with neurology.  Continue aspirin and statins.  Follow-up on 2D echocardiogram when done.  PT and OT evaluation.   2.  Urinary tract infection - IV Rocephin daily - Urine culture pending.  Will adjust therapy based on culture results  3.  Hyperlipidemia - Statin therapy continued with Lipitor - Lipid panel panel done with LDL of 160  4.  Diabetes mellitus - Glipizide continued - Moderate sliding scale insulin -Hemoglobin A1c level of 8.5.   5.  Hypertension Blood pressure controlled.  Allowing for permissive hypertension due to recent acute CVA.  DVT prophylaxis; Lovenox  All the records are reviewed and case discussed with Care Management/Social Worker. Management plans discussed with the patient, family and they are in agreement.  CODE  STATUS: Full Code  TOTAL TIME TAKING CARE OF THIS PATIENT: 37 minutes.   More than 50% of the time was spent in counseling/coordination of care: YES  POSSIBLE D/C IN 2 DAYS, DEPENDING ON CLINICAL CONDITION.   Zelina Jimerson M.D on 08/04/2018 at 1:05 PM  Between 7am to 6pm -  Pager - [phone]  After 6pm go to [email or web address] - Social research officer, governmentpassword EPAS [email or web address]  [facility] [place] [facility]  Office  [phone]  CC: Primary care physician; Karie SchwalbeLetvak, Richard I, MD  Note: This dictation was prepared with Dragon dictation along with smaller phrase technology. Any transcriptional errors that result from this process are unintentional.

## 2018-08-04 NOTE — Progress Notes (Signed)
NP notified need for diet order. Pt passed swallow screen in ED.

## 2018-08-04 NOTE — Progress Notes (Signed)
PT Cancellation Note  Patient Details Name: PASCUALA KLUTTS MRN: [id number] DOB: 1955-06-06   Cancelled Treatment:    Reason Eval/Treat Not Completed: Patient declined, no reason specified;Other (comment)(Patient consult recieved and reviewed. Attempted evaluation, patient declined at this time due to headache and wanting to sleep, requests PT return later. Will attempt again at a later time/date.)  Janna Arch, PT, DPT   08/04/2018, 3:22 PM

## 2018-08-04 NOTE — H&P (Addendum)
[facility] - [place] at [facility]   PATIENT NAME: Tami Jones    MR#:  [id number]  DATE OF BIRTH:  01/03/1956  DATE OF ADMISSION:  08/03/2018  PRIMARY CARE PHYSICIAN: Karie SchwalbeLetvak, Richard I, MD   REQUESTING/REFERRING PHYSICIAN: Shaune Pollackameron Isaacs, MD  CHIEF COMPLAINT:   Chief Complaint  Patient presents with  . Numbness    right hand   . Weakness    HISTORY OF PRESENT ILLNESS:  Tami CornerCynthia Knouff  is a 63 y.o. 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  She presented to the emergency room with complaint of numbness, weakness, and decreased dexterity of her right hand and upper extremity.  Patient noted weakness and decreased sensation of her right upper extremity on 08/02/2018 while at work.  Patient works in a lab.  She began to notice numbness in her fingers of her right hand and became unable to write with progressive weakness and heaviness of her right upper extremity.  She has also noted nausea however no vomiting.  She denies facial drooping or slurred speech.  She denies weakness of lower extremities.  She denies difficulty ambulating.  She denies dizziness or headache.  She denies changes in vision or diplopia.  She denies dysphagia.  She denies chest pain, fever, chills, vomiting.  She denies abdominal pain.  Patient has no known history of DVT or pulmonary embolism.  She denies recent adjustments in her medications.  Additionally, patient also reports shortness of breath present for about 2 weeks which is present with exertion.  She saw her pulmonologist recently on 07/31/18 with echocardiogram demonstrating 55% ejection fraction. .  CT brain on arrival demonstrated no acute intracranial abnormality.  COVID-19 testing is pending once.  D-dimer is 340.19.  Urinalysis demonstrates trace leukocytes, 3+ bacteria, 11-20 WBCs.  She has been admitted to the  hospitalist service for further management.  PAST MEDICAL HISTORY:   Past Medical History:  Diagnosis Date  . Anxiety   . Asthma   . 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 Thad Rangereynolds  . Diabetes mellitus   . GERD (gastroesophageal reflux disease)   . Hyperlipidemia   . Hypertension     PAST SURGICAL HISTORY:   Past Surgical History:  Procedure Laterality Date  . CARDIOVASCULAR STRESS TEST  1/15   myoview. EF 60%  . CESAREAN SECTION    . ESOPHAGOGASTRODUODENOSCOPY  05/2005  . TONSILLECTOMY AND ADENOIDECTOMY      SOCIAL HISTORY:   Social History   Tobacco Use  . Smoking status: Never Smoker  . Smokeless tobacco: Never Used  Substance Use Topics  . Alcohol use: No    Alcohol/week: 0.0 standard drinks    Comment: wine occassionally    FAMILY HISTORY:   Family History  Problem Relation Age of Onset  . Coronary artery disease Mother   . Diabetes Mellitus II Mother   . Heart attack Mother [id number]  . Diabetes Mellitus II Maternal Grandmother     DRUG ALLERGIES:   Allergies  Allergen Reactions  . Citalopram Other (See Comments)    Feels odd  . Doxycycline Hyclate Nausea And Vomiting  . Erythromycin Other (See Comments)    Irritated stomach  . Montelukast Sodium Itching  . Nitrofurantoin Nausea And Vomiting  . Sulfa Antibiotics Nausea And Vomiting  . Tramadol Hcl Other (See Comments)    Feels odd  . Venlafaxine Hcl Other (See Comments)  Feels odd  . Amoxicillin-Pot Clavulanate Nausea And Vomiting  . Clarithromycin Rash     See ER note 02/2017    REVIEW OF SYSTEMS:   Review of Systems  Constitutional: Negative for chills, fever and malaise/fatigue.  HENT: Negative for congestion, ear pain, sinus pain, sore throat and tinnitus.   Eyes: Negative for blurred vision, double vision, photophobia and pain.  Respiratory: Positive for shortness of breath (worse with exertion). Negative for cough and wheezing.   Cardiovascular:  Negative for chest pain, palpitations, orthopnea and leg swelling.  Gastrointestinal: Positive for nausea. Negative for abdominal pain, blood in stool, constipation, heartburn, melena and vomiting.  Genitourinary: Positive for dysuria. Negative for flank pain, frequency, hematuria and urgency.  Musculoskeletal: Negative for back pain, falls, joint pain, myalgias and neck pain.  Skin: Negative.  Negative for itching and rash.  Neurological: Positive for tingling (right hand/fingers), sensory change (right upper extremity numbness) and focal weakness (right hand with difficulty writing). Negative for dizziness, tremors, speech change, seizures, loss of consciousness and headaches.  Psychiatric/Behavioral: Negative.     MEDICATIONS AT HOME:   Prior to Admission medications   Medication Sig Start Date End Date Taking? Authorizing Provider  amLODipine (NORVASC) 5 MG tablet Take 5 mg by mouth 2 (two) times daily. 02/14/18 02/14/19 Yes [provider]  aspirin EC 81 MG tablet Take 81 mg by mouth daily.   Yes [provider]  atorvastatin (LIPITOR) 40 MG tablet Take 1 tablet (40 mg total) by mouth daily. 04/08/17  Yes Karie SchwalbeLetvak, Richard I, MD  glipiZIDE (GLUCOTROL XL) 5 MG 24 hr tablet Take 1 tablet (5 mg total) by mouth daily with breakfast. 07/27/17  Yes Tillman AbideLetvak, Richard I, MD  lisinopril (PRINIVIL,ZESTRIL) 20 MG tablet TAKE 1 TABLET BY MOUTH DAILY 07/12/17  Yes Karie SchwalbeLetvak, Richard I, MD      VITAL SIGNS:  Blood pressure (!) 160/82, pulse 82, temperature 98.9 F (37.2 C), temperature source Oral, resp. rate 20, height 5\' 3"  (1.6 m), weight 103.9 kg, SpO2 96 %.  PHYSICAL EXAMINATION:  Physical Exam Vitals signs and nursing note reviewed.  Constitutional:      General: She is not in acute distress.    Appearance: Normal appearance. She is not ill-appearing.  HENT:     Head: Normocephalic.     Right Ear: External ear normal.     Left Ear: External ear normal.     Nose: Nose normal.      Mouth/Throat:     Mouth: Mucous membranes are moist.     Pharynx: Oropharynx is clear.  Eyes:     General: No scleral icterus.    Extraocular Movements: Extraocular movements intact.     Conjunctiva/sclera: Conjunctivae normal.     Pupils: Pupils are equal, round, and reactive to light.  Neck:     Musculoskeletal: Normal range of motion and neck supple. No muscular tenderness.     Vascular: No carotid bruit.  Cardiovascular:     Rate and Rhythm: Normal rate and regular rhythm.     Pulses: Normal pulses.     Heart sounds: Normal heart sounds.  Pulmonary:     Effort: Pulmonary effort is normal. No respiratory distress.     Breath sounds: Normal breath sounds. No wheezing, rhonchi or rales.  Abdominal:     General: Bowel sounds are normal. There is no distension.     Palpations: Abdomen is soft.     Tenderness: There is no right CVA tenderness, left CVA tenderness, guarding or  rebound.  Musculoskeletal: Normal range of motion.        General: No swelling or tenderness.     Right lower leg: No edema.     Left lower leg: No edema.  Skin:    General: Skin is warm and dry.     Capillary Refill: Capillary refill takes less than 2 seconds.     Findings: No bruising, erythema or rash.  Neurological:     Mental Status: She is alert and oriented to person, place, and time.     Sensory: Sensory deficit (decreased sensation of fingers of right hand) present.     Coordination: Coordination abnormal (decreased dexterity of right hand).  Psychiatric:        Mood and Affect: Mood normal.        Behavior: Behavior normal.        Thought Content: Thought content normal.        Judgment: Judgment normal.     LABORATORY PANEL:   CBC Recent Labs  Lab 08/03/18 1338  WBC 5.9  6.0  HGB 14.1  14.1  HCT 42.2  41.9  PLT 248  224   ------------------------------------------------------------------------------------------------------------------  Chemistries  Recent Labs  Lab  08/03/18 1927  NA 138  K 3.8  CL 101  CO2 26  GLUCOSE 213*  BUN 18  CREATININE 1.01*  CALCIUM 9.4  AST 25  ALT 31  ALKPHOS 104  BILITOT 0.5   ------------------------------------------------------------------------------------------------------------------  Cardiac Enzymes No results for input(s): TROPONINI in the last 168 hours. ------------------------------------------------------------------------------------------------------------------  RADIOLOGY:  Ct Head Wo Contrast  Result Date: 08/03/2018 CLINICAL DATA:  Cyst 6 right hand numbness, weakness EXAM: CT HEAD WITHOUT CONTRAST TECHNIQUE: Contiguous axial images were obtained from the base of the skull through the vertex without intravenous contrast. COMPARISON:  MRI 01/03/2016 FINDINGS: Brain: Old bilateral occipital infarcts and bilateral basal ganglia lacunar infarcts, stable. No acute intracranial abnormality. Specifically, no hemorrhage, hydrocephalus, mass lesion, acute infarction, or significant intracranial injury. Vascular: No hyperdense vessel or unexpected calcification. Skull: No acute calvarial abnormality. Sinuses/Orbits: Visualized paranasal sinuses and mastoids clear. Orbital soft tissues unremarkable. 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 No acute intracranial abnormality. Electronically Signed   By: Rolm Baptise M.D.   On: 08/03/2018 19:34      IMPRESSION AND PLAN:   1.  CVA - Aspirin and Lipitor have been continued.  DVT prophylaxis with Lovenox has been initiated as well - Lipid panel and hemoglobin A1c are pending - Every 2 hours neuro checks - We will get carotid ultrasound, MRI, MRA.  Brain, and echo.   - PT/OT consult for decreased dexterity of right upper extremity - We will consult neurology for further evaluation and recommendations - Normal saline infusing to peripheral IV at 75 cc/h - Patient is being evaluated by her pulmonologist, Dr. Lanney Gins  for possibility of obstructive sleep apnea  2.  Urinary tract infection - IV Rocephin daily - Urine culture pending.  Will adjust therapy based on culture results  3.  Hyperlipidemia - Statin therapy continued with Lipitor - Lipid panel pending  4.  Diabetes mellitus - Glipizide continued - Moderate sliding scale insulin -Hemoglobin A1c pending  5.  Hypertension - Will hold Norvasc for now and treat hypertension expectantly  DVT and PPI prophylaxis initiated    All the records are reviewed and case discussed with ED provider. The plan of care was discussed in details with the patient (and family). I answered all questions. The  patient agreed to proceed with the above mentioned plan. Further management will depend upon hospital course.   CODE STATUS: Full code  TOTAL TIME TAKING CARE OF THIS PATIENT: 45 minutes.    Milas Kocherngela H Alecxis Baltzell CRNP on 08/04/2018 at 2:08 AM  Pager - [phone]  After 6pm go to [email or web address] - Social research officer, governmentpassword EPAS [facility]  [facility] [place] [facility]  Office  [phone]  CC: Primary care physician; Karie SchwalbeLetvak, Richard I, MD   Note: This dictation was prepared with Dragon dictation along with smaller phrase technology. Any transcriptional errors that result from this process are unintentional.

## 2018-08-04 NOTE — Progress Notes (Signed)
OT Cancellation Note  Patient Details Name: Tami Jones MRN: [id number] DOB: Aug 30, 1955   Cancelled Treatment:    Reason Eval/Treat Not Completed: Patient at procedure or test/ unavailable. Order received, chart reviewed. Pt out of room for testing. Will re-attempt OT evaluation at later date/time as pt is available and medically appropriate.  Jeni Salles, MPH, MS, OTR/L ascom [phone] 08/04/18, 12:59 PM

## 2018-08-05 ENCOUNTER — Other Ambulatory Visit: Payer: Self-pay | Admitting: Internal Medicine

## 2018-08-05 LAB — BASIC METABOLIC PANEL
Anion gap: 7 (ref 5–15)
BUN: 20 mg/dL (ref 8–23)
CO2: 25 mmol/L (ref 22–32)
Calcium: 8.5 mg/dL — ABNORMAL LOW (ref 8.9–10.3)
Chloride: 109 mmol/L (ref 98–111)
Creatinine, Ser: 0.98 mg/dL (ref 0.44–1.00)
GFR calc Af Amer: >60 mL/min (ref >60)
GFR calc non Af Amer: >60 mL/min (ref >60)
Glucose, Bld: 116 mg/dL — ABNORMAL HIGH (ref 70–99)
Potassium: 3.6 mmol/L (ref 3.5–5.1)
Sodium: 141 mmol/L (ref 135–145)

## 2018-08-05 LAB — GLUCOSE, CAPILLARY: Glucose-Capillary: 114 mg/dL — ABNORMAL HIGH (ref 70–99)

## 2018-08-05 LAB — CBC
HCT: 39 % (ref 36.0–46.0)
Hemoglobin: 12.7 g/dL (ref 12.0–15.0)
MCH: 30 pg (ref 26.0–34.0)
MCHC: 32.6 g/dL (ref 30.0–36.0)
MCV: 92.2 fL (ref 80.0–100.0)
Platelets: 197 10*3/uL (ref 150–400)
RBC: 4.23 MIL/uL (ref 3.87–5.11)
RDW: 12.2 % (ref 11.5–15.5)
WBC: 5.2 10*3/uL (ref 4.0–10.5)
nRBC: 0 % (ref 0.0–0.2)

## 2018-08-05 LAB — NOVEL CORONAVIRUS, NAA (HOSP ORDER, SEND-OUT TO REF LAB; TAT 18-24 HRS): SARS-CoV-2, NAA: NOT DETECTED

## 2018-08-05 LAB — HIV ANTIBODY (ROUTINE TESTING W REFLEX): HIV Screen 4th Generation wRfx: NONREACTIVE

## 2018-08-05 LAB — MAGNESIUM: Magnesium: 2.1 mg/dL (ref 1.7–2.4)

## 2018-08-05 MED ORDER — ASPIRIN 325 MG PO TBEC: 325.0000 mg | DELAYED_RELEASE_TABLET | Freq: Every day | ORAL | 0 refills | Status: DC

## 2018-08-05 MED ORDER — ATORVASTATIN CALCIUM 80 MG PO TABS: 80.0000 mg | ORAL_TABLET | Freq: Every day | ORAL | 0 refills | Status: DC

## 2018-08-05 MED ORDER — CEPHALEXIN 500 MG PO CAPS: 500.0000 mg | ORAL_CAPSULE | Freq: Three times a day (TID) | ORAL | 0 refills | Status: AC

## 2018-08-05 NOTE — Progress Notes (Signed)
MD order received in [facility] to discharge pt home with home health today; Care Management previously established [facility] with [facility]; DME rolling walker and 3 in 1 BSC delivered to the pt's room; verbally reviewed AVS with pt, Rxs escribed to [facility]; no questions voiced at this time; pt discharged via wheelchair by nursing to the [place] entrance with DME

## 2018-08-05 NOTE — Discharge Instructions (Signed)
It was so nice to meet you during this hospitalization!  You came into the hospital with right arm weakness. We found that you had a stroke.  I have prescribed the following medications: 1. Please take aspirin 325mg  daily (you were previously taking 81mg  daily) to decrease your risk of a future stroke 2. Please take Lipitor 80mg  daily (you were previously taking 40mg  daily) to decrease your cholesterol 3. Please take Keflex 500mg  three times a day for the next 6 days. This is an antibiotic for your UTI.  Take care, Dr. [facility] established with [facility] - rolling walker and 3 in 1 bedside commode

## 2018-08-05 NOTE — Progress Notes (Signed)
Occupational Therapy Treatment Patient Details Name: Tami Jones MRN: [id number] DOB: Dec 07, 1955 Today's Date: 08/05/2018    History of present illness 63 y.o. 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  She presented to the emergency room on 6/18 after weakness and decreased sensation of her right upper extremity which began on 08/02/2018 while at work. MRI (+) for  "Acute lacunar infarct in the left centrum semiovale.Chronic small vessel ischemia with remote right caudate lacunarinfarct.Small remote bilateral occipital infarcts."   OT comments  Pt progressing toward stated goals, mildly labile this date by being tearful to laughing in quick moments. Pt is anxious and concerned about functional return in RUE but easily redirected. Frequently asking "is this normal". Educated pt on SROM strategies for RUE, pt returned demo. AROM exercises of shoulder flexion, elbow flexion/ext, and towel grasps on table given for pt. Educated on dressing hemiplegic side. Pt completing toilet t/f and functional mobility at min guard- supervision level for safety. Reviewed purpose of BSC as shower chair, pt familiar from previous family member having to use. Recommend pt receive HHOT at d/c to help return to ind PLOF. Will continue to follow per POC updated below, but anticipate d/c this date.    Follow Up Recommendations  Home health OT;Supervision - Intermittent    Equipment Recommendations  Other (comment)(BSC delivered)    Recommendations for Other Services      Precautions / Restrictions Precautions Precautions: Fall Restrictions Weight Bearing Restrictions: No       Mobility Bed Mobility               General bed mobility comments: up in chair  Transfers Overall transfer level: Needs assistance Equipment used: None Transfers: Sit to/from Stand Sit to Stand: Min guard          General transfer comment: close min guard for safety    Balance Overall balance assessment: Needs assistance Sitting-balance support: Feet supported;Single extremity supported Sitting balance-Leahy Scale: Good     Standing balance support: Single extremity supported;During functional activity Standing balance-Leahy Scale: Fair                             ADL either performed or assessed with clinical judgement   ADL Overall ADL's : Needs assistance/impaired Eating/Feeding: Minimal assistance;Sitting Eating/Feeding Details (indicate cue type and reason): pt attempted to get coffee cup to mouth with R dominant extremity, needing min A and VC's to engage RUE and how to support with L Grooming: Supervision/safety;Wash/dry hands;Standing Grooming Details (indicate cue type and reason): cues to engage RUE                 Toilet Transfer: Supervision/safety;Regular Museum/gallery exhibitions officer and Hygiene: Supervision/safety;Sit to/from stand Toileting - Clothing Manipulation Details (indicate cue type and reason): don/doffed panties with supervision assist and increased time, no phys assist needed     Functional mobility during ADLs: Supervision/safety General ADL Comments: overall pt continues to require cues to engage RUE in daily tasks     Vision Baseline Vision/History: Wears glasses Wears Glasses: Reading only Patient Visual Report: No change from baseline Additional Comments: pervious peripheral vision complications from old stroke, no new deficits   Perception     Praxis      Cognition Arousal/Alertness: Awake/alert Behavior During Therapy: WFL for tasks assessed/performed;Anxious Overall Cognitive Status: Within Functional Limits for tasks assessed  General Comments: mildly labile, tearful to laughing quickly in conversation. Anxious in regards to functional return         Exercises     Shoulder Instructions       General Comments      Pertinent Vitals/ Pain       Pain Assessment: No/denies pain  Home Living                                          Prior Functioning/Environment              Frequency  Min 2X/week        Progress Toward Goals  OT Goals(current goals can now be found in the care plan section)  Progress towards OT goals: Progressing toward goals  Acute Rehab OT Goals Patient Stated Goal: To get my right arm back OT Goal Formulation: With patient Time For Goal Achievement: 08/18/18 Potential to Achieve Goals: Good  Plan Discharge plan remains appropriate    Co-evaluation                 AM-PAC OT "6 Clicks" Daily Activity     Outcome Measure   Help from another person eating meals?: A Little Help from another person taking care of personal grooming?: A Little Help from another person toileting, which includes using toliet, bedpan, or urinal?: None Help from another person bathing (including washing, rinsing, drying)?: A Little Help from another person to put on and taking off regular upper body clothing?: A Little Help from another person to put on and taking off regular lower body clothing?: A Little 6 Click Score: 19    End of Session Equipment Utilized During Treatment: Gait belt  OT Visit Diagnosis: Unsteadiness on feet (R26.81);Hemiplegia and hemiparesis Hemiplegia - Right/Left: Right Hemiplegia - dominant/non-dominant: Dominant Hemiplegia - caused by: Cerebral infarction   Activity Tolerance Patient tolerated treatment well   Patient Left in chair;with call bell/phone within reach   Nurse Communication          Time: [id number] OT Time Calculation (min): 33 min  Charges: OT General Charges $OT Visit: 1 Visit OT Treatments $Self Care/Home Management : 23-37 mins  Dalphine HandingKaylee Maelynn Moroney, MSOT, OTR/L Behavioral Health OT/ Acute Relief OT ASCOM [phone]    Dalphine HandingKaylee  Margarete Horace 08/05/2018, 11:14 AM

## 2018-08-05 NOTE — Discharge Summary (Signed)
[facility] [place] [facility]   PATIENT NAME: Tami Jones    MR#:  [id number]  DATE OF BIRTH:  09-Jul-1955  DATE OF ADMISSION:  08/03/2018   ADMITTING PHYSICIAN: Hannah Beat, MD  DATE OF DISCHARGE: 08/05/2018 12:27 PM  PRIMARY CARE PHYSICIAN: Karie Schwalbe, MD   ADMISSION DIAGNOSIS:  right hand numbness DISCHARGE DIAGNOSIS:  Active Problems:   CVA (cerebral vascular accident) (HCC)  SECONDARY DIAGNOSIS:   Past Medical History:  Diagnosis Date   Anxiety    Asthma    CVA (cerebral vascular accident) (HCC) 03/2006   right occipital, Dr. Thad Ranger   Diabetes mellitus    GERD (gastroesophageal reflux disease)    Hyperlipidemia    Hypertension    HOSPITAL COURSE:   Tami Jones is a 63 year old female who presented to the ED with right upper extremity numbness, weakness, and incoordination.  CT head was negative for acute abnormalities.  MRI brain revealed an acute lacunar infarct in the left centrum semiovale. She was admitted for further management.  AcuteCVA- with right upper extremity weakness and numbness. -MRI brain with acute lacunar infarct in the left centrum semiovale -MRA neck with minimal flow in the right vertebral artery until the V3 segment, high-grade stenosis of the left V4 segment, and moderate proximal basilar stenosis -Carotid Dopplers negative -Echo with EF 55 to 60% and moderate asymmetric left ventricular hypertrophy of the basal anteroseptal wall -Aspirin dose increased from 81 mg daily to 325 mg daily -LDL was 160, so Lipitor dose increased from 40 mg daily to 80 mg daily -Needs to follow-up with neurology as an outpatient -Home health PT and OT were ordered on discharge  Urinary tract infection -Treated with IV ceftriaxone and discharged home with Keflex for 5 more days -Urine culture pending at the time of discharge  Hyperlipidemia- LDL 160 this admission -Lipitor was increased to 80 mg daily  Type 2  diabetes -A1c 8.5 this admission -Glipizide was continued  Hypertension -Blood pressure well controlled this hospitalization -Home blood pressure medicines were continued on discharge  DISCHARGE CONDITIONS:  Acute CVA UTI Hyperlipidemia Type 2 diabetes Hypertension CONSULTS OBTAINED:  Treatment Team:  Marita Snellen, MD Thana Farr, MD DRUG ALLERGIES:   Allergies  Allergen Reactions   Citalopram Other (See Comments)    Feels odd   Doxycycline Hyclate Nausea And Vomiting   Erythromycin Other (See Comments)    Irritated stomach   Montelukast Sodium Itching   Nitrofurantoin Nausea And Vomiting   Sulfa Antibiotics Nausea And Vomiting   Tramadol Hcl Other (See Comments)    Feels odd   Venlafaxine Hcl Other (See Comments)    Feels odd   Amoxicillin-Pot Clavulanate Nausea And Vomiting   Clarithromycin Rash     See ER note 02/2017   DISCHARGE MEDICATIONS:   Allergies as of 08/05/2018      Reactions   Citalopram Other (See Comments)   Feels odd   Doxycycline Hyclate Nausea And Vomiting   Erythromycin Other (See Comments)   Irritated stomach   Montelukast Sodium Itching   Nitrofurantoin Nausea And Vomiting   Sulfa Antibiotics Nausea And Vomiting   Tramadol Hcl Other (See Comments)   Feels odd   Venlafaxine Hcl Other (See Comments)   Feels odd   Amoxicillin-pot Clavulanate Nausea And Vomiting   Clarithromycin Rash    See ER note 02/2017      Medication List    TAKE these medications   amLODipine 5 MG tablet Commonly known as:  NORVASC Take 5 mg by mouth 2 (two) times daily.   aspirin 325 MG EC tablet Take 1 tablet (325 mg total) by mouth daily. What changed:   medication strength  how much to take   atorvastatin 80 MG tablet Commonly known as: Lipitor Take 1 tablet (80 mg total) by mouth daily. What changed:   medication strength  how much to take   cephALEXin 500 MG capsule Commonly known as: KEFLEX Take 1 capsule (500 mg total)  by mouth 3 (three) times daily for 6 days.   glipiZIDE 5 MG 24 hr tablet Commonly known as: GLUCOTROL XL Take 1 tablet (5 mg total) by mouth daily with breakfast.   lisinopril 20 MG tablet Commonly known as: ZESTRIL TAKE 1 TABLET BY MOUTH DAILY            Durable Medical Equipment  (From admission, onward)         Start     Ordered   08/05/18 0910  For home use only DME Bedside commode  Once    Question:  Patient needs a bedside commode to treat with the following condition  Answer:  Stroke [facility](HCC)   08/05/18 0909   08/05/18 0909  For home use only DME Walker rolling  Once    Question:  Patient needs a walker to treat with the following condition  Answer:  Stroke (HCC)   08/05/18 0909           DISCHARGE INSTRUCTIONS:  1.  Follow-up with PCP in 5 days 2.  Follow-up with neurology in 2 weeks 3.  Increase aspirin dose to 325 mg daily 4.  Increase Lipitor dose to 80 mg daily 5.  Take Keflex 3 times daily for 5 more days DIET:  Cardiac diet and Diabetic diet DISCHARGE CONDITION:  Stable ACTIVITY:  Activity as tolerated OXYGEN:  Home Oxygen: No.  Oxygen Delivery: room air DISCHARGE LOCATION:  home   If you experience worsening of your admission symptoms, develop shortness of breath, life threatening emergency, suicidal or homicidal thoughts you must seek medical attention immediately by calling 911 or calling your MD immediately  if symptoms less severe.  You Must read complete instructions/literature along with all the possible adverse reactions/side effects for all the Medicines you take and that have been prescribed to you. Take any new Medicines after you have completely understood and accpet all the possible adverse reactions/side effects.   Please note  You were cared for by a hospitalist during your hospital stay. If you have any questions about your discharge medications or the care you received while you were in the hospital after you are discharged, you can  call the unit and asked to speak with the hospitalist on call if the hospitalist that took care of you is not available. Once you are discharged, your primary care physician will handle any further medical issues. Please note that NO REFILLS for any discharge medications will be authorized once you are discharged, as it is imperative that you return to your primary care physician (or establish a relationship with a primary care physician if you do not have one) for your aftercare needs so that they can reassess your need for medications and monitor your lab values.    On the day of Discharge:  VITAL SIGNS:  Blood pressure (!) 152/65, pulse 72, temperature 98.8 F (37.1 C), temperature source Oral, resp. rate 18, height 5\' 3"  (1.6 m), weight 103.9 kg, SpO2 97 %. PHYSICAL EXAMINATION:  GENERAL:  63 y.o.-year-old patient lying in the bed with no acute distress.  EYES: Pupils equal, round, reactive to light and accommodation. No scleral icterus. Extraocular muscles intact.  HEENT: Head atraumatic, normocephalic. Oropharynx and nasopharynx clear.  NECK:  Supple, no jugular venous distention. No thyroid enlargement, no tenderness.  LUNGS: Normal breath sounds bilaterally, no wheezing, rales,rhonchi or crepitation. No use of accessory muscles of respiration.  CARDIOVASCULAR: S1, S2 normal. No murmurs, rubs, or gallops.  ABDOMEN: Soft, non-tender, non-distended. Bowel sounds present. No organomegaly or mass.  EXTREMITIES: No pedal edema, cyanosis, or clubbing.  NEUROLOGIC: Cranial nerves II through XII are intact. Muscle strength 5/5 in the left upper extremity, left lower extremity, and right lower extremity.  4/5 muscle strength in the right upper extremity.  Decreased sensation to light touch in the right upper extremity.  Sensation otherwise intact. Gait not checked.  PSYCHIATRIC: The patient is alert and oriented x 3.  SKIN: No obvious rash, lesion, or ulcer.  DATA REVIEW:   CBC Recent Labs  Lab  08/05/18 0458  WBC 5.2  HGB 12.7  HCT 39.0  PLT 197    Chemistries  Recent Labs  Lab 08/03/18 1927  08/05/18 0458  NA 138  --  141  K 3.8  --  3.6  CL 101  --  109  CO2 26  --  25  GLUCOSE 213*  --  116*  BUN 18  --  20  CREATININE 1.01*   < > 0.98  CALCIUM 9.4  --  8.5*  MG  --   --  2.1  AST 25  --   --   ALT 31  --   --   ALKPHOS 104  --   --   BILITOT 0.5  --   --    < > = values in this interval not displayed.     Microbiology Results  Results for orders placed or performed during the hospital encounter of 08/03/18  Urine culture     Status: Abnormal (Preliminary result)   Collection Time: 08/03/18  7:27 PM   Specimen: Urine, Clean Catch  Result Value Ref Range Status   Specimen Description   Final    URINE, CLEAN CATCH Performed at [facility], [address] [place], [place] [address]    Special Requests   Final    Normal Performed at [facility], [address] [place], [place] [address]    Culture >=100,000 COLONIES/mL GRAM NEGATIVE RODS (A)  Final   Report Status PENDING  Incomplete  SARS Coronavirus 2 (CEPHEID - Performed in [facility] hospital lab), Hosp Order     Status: None   Collection Time: 08/04/18  8:29 AM   Specimen: Nasopharyngeal Swab  Result Value Ref Range Status   SARS Coronavirus 2 NEGATIVE NEGATIVE Final    Comment: (NOTE) If result is NEGATIVE SARS-CoV-2 target nucleic acids are NOT DETECTED. The SARS-CoV-2 RNA is generally detectable in upper and lower  respiratory specimens during the acute phase of infection. The lowest  concentration of SARS-CoV-2 viral copies this assay can detect is 250  copies / mL. A negative result does not preclude SARS-CoV-2 infection  and should not be used as the sole basis for treatment or other  patient management decisions.  A negative result may occur with  improper specimen collection / handling, submission of specimen other  than nasopharyngeal swab, presence of viral  mutation(s) within the  areas targeted by this assay, and inadequate number of  viral copies  (<250 copies / mL). A negative result must be combined with clinical  observations, patient history, and epidemiological information. If result is POSITIVE SARS-CoV-2 target nucleic acids are DETECTED. The SARS-CoV-2 RNA is generally detectable in upper and lower  respiratory specimens dur ing the acute phase of infection.  Positive  results are indicative of active infection with SARS-CoV-2.  Clinical  correlation with patient history and other diagnostic information is  necessary to determine patient infection status.  Positive results do  not rule out bacterial infection or co-infection with other viruses. If result is PRESUMPTIVE POSTIVE SARS-CoV-2 nucleic acids MAY BE PRESENT.   A presumptive positive result was obtained on the submitted specimen  and confirmed on repeat testing.  While 2019 novel coronavirus  (SARS-CoV-2) nucleic acids may be present in the submitted sample  additional confirmatory testing may be necessary for epidemiological  and / or clinical management purposes  to differentiate between  SARS-CoV-2 and other Sarbecovirus currently known to infect humans.  If clinically indicated additional testing with an alternate test  methodology [phone]([id number]) is advised. The SARS-CoV-2 RNA is generally  detectable in upper and lower respiratory sp ecimens during the acute  phase of infection. The expected result is Negative. Fact Sheet for Patients:  BoilerBrush.com.cyhttps://www.fda.gov/media/136312/download Fact Sheet for Healthcare Providers: https://pope.com/https://www.fda.gov/media/136313/download This test is not yet approved or cleared by the [place] FDA and has been authorized for detection and/or diagnosis of SARS-CoV-2 by FDA under an Emergency Use Authorization (EUA).  This EUA will remain in effect (meaning this test can be used) for the duration of the COVID-19 declaration under Section 564(b)(1)  of the Act, 21 U.S.C. section 360bbb-3(b)(1), unless the authorization is terminated or revoked sooner. Performed at [facility], [address] [place], [place] [address]     RADIOLOGY:  [place] Carotid Bilateral (at [facility] Only)  Result Date: 08/04/2018 CLINICAL DATA:  Acute left centrum semiovale lacunar infarct EXAM: BILATERAL CAROTID DUPLEX ULTRASOUND TECHNIQUE: [name] CullensGray scale imaging, color Doppler and duplex ultrasound were performed of bilateral carotid and vertebral arteries in the neck. COMPARISON:  08/04/2018 MR FINDINGS: Criteria: Quantification of carotid stenosis is based on velocity parameters that correlate the residual internal carotid diameter with NASCET-based stenosis levels, using the diameter of the distal internal carotid lumen as the denominator for stenosis measurement. The following velocity measurements were obtained: RIGHT ICA: 75/20 cm/sec CCA: 75/20 cm/sec SYSTOLIC ICA/CCA RATIO:  1.0 ECA: 101 cm/sec LEFT ICA: 88/35 cm/sec CCA: 95/30 cm/sec SYSTOLIC ICA/CCA RATIO:  0.9 ECA: 115 cm/sec RIGHT CAROTID ARTERY: Minor echogenic shadowing plaque formation. No hemodynamically significant right ICA stenosis, velocity elevation, or turbulent flow. Degree of narrowing less than 50%. RIGHT VERTEBRAL ARTERY: Not confidently visualized, possibly occluded LEFT CAROTID ARTERY: Similar scattered minor echogenic plaque formation. No hemodynamically significant left ICA stenosis, velocity elevation, or turbulent flow. LEFT VERTEBRAL ARTERY:  Antegrade IMPRESSION: Minor carotid atherosclerosis. No hemodynamically significant ICA stenosis. Degree of narrowing less than 50% bilaterally by ultrasound criteria. Nonvisualized right vertebral artery, possibly occluded Patent left vertebral artery with antegrade flow Electronically Signed   By: Judie PetitM.  Shick M.D.   On: 08/04/2018 13:24     Management plans discussed with the patient, family and they are in agreement.  CODE STATUS: Full Code     TOTAL TIME TAKING CARE OF THIS PATIENT: 45 minutes.    Jinny BlossomKaty D Lavoy Bernards M.D on 08/05/2018 at 12:43 PM  Between 7am to 6pm - Pager - [phone]  After 6pm go to  [email or web address] - Social research officer, governmentpassword [email or web address] [facility]  [facility] [facility]  Office  [phone]  CC: Primary care physician; Karie SchwalbeLetvak, Richard I, MD   Note: This dictation was prepared with Dragon dictation along with smaller phrase technology. Any transcriptional errors that result from this process are unintentional.

## 2018-08-05 NOTE — Evaluation (Signed)
Physical Therapy Evaluation Patient Details Name: Tami Jones MRN: [id number] DOB: 10/31/1955 Today's Date: 08/05/2018   History of Present Illness  63 y.o. 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  She presented to the emergency room on 6/18 after weakness and decreased sensation of her right upper extremity which began on 08/02/2018 while at work. MRI (+) for  "Acute lacunar infarct in the left centrum semiovale. Chronic small vessel ischemia with remote right caudate lacunar infarct. Small remote bilateral occipital infarcts."  Clinical Impression  Prior to hospital admission, pt was independent with functional mobility.  Pt lives with family in 1 level home with steps to enter.  Currently pt is SBA with transfers; CGA to SBA with ambulation using RW; and CGA navigating 6 steps with L railing.  Trialed pt with no AD, SPC, and RW and pt appearing safest with RW (pt also reporting feeling most comfortable with RW).  Pt noted with R UE and R LE weakness; pt with fair R hand grip strength but appeared to do fairly well holding onto walker with R hand.  Educated pt on pacing and activity modification: pt verbalizing good understanding.  Pt would benefit from skilled PT to address noted impairments and functional limitations (see below for any additional details).  Upon hospital discharge, recommend pt discharge with HHPT and SBA with functional mobility for safety (pt verbalizing appropriate understanding and reports she has appropriate support at home).    Follow Up Recommendations Home health PT;Supervision for mobility/OOB    Equipment Recommendations  Rolling walker with 5" wheels;3in1 (PT)    Recommendations for Other Services OT consult     Precautions / Restrictions Precautions Precautions: Fall Restrictions Weight Bearing Restrictions: No      Mobility  Bed  Mobility               General bed mobility comments: Pt and pt's nurse reporting no difficulty getting out of bed this morning  Transfers Overall transfer level: Needs assistance Equipment used: None Transfers: Sit to/from Stand Sit to Stand: Supervision         General transfer comment: x3 trials; no difficulty noted  Ambulation/Gait Ambulation/Gait assistance: Min guard;Supervision Gait Distance (Feet): (40 feet with no AD; 40 feet with SPC; 40 feet with RW; 100 feet with RW) Assistive device: Rolling walker (2 wheeled);None;Straight cane   Gait velocity: mildly decreased   General Gait Details: decreased stance time R LE; mildly unsteady without AD and with SPC; pt appearing steady ambulating with RW  Stairs Stairs: Yes Stairs assistance: Min guard Stair Management: One rail Left;Step to pattern;Forwards Number of Stairs: 6 General stair comments: ascended 6 steps with L UE support on L railing CGA; descended 6 steps with B UE support on same railing partially sideway stepping down; vc's for stepping pattern initially  Wheelchair Mobility    Modified Rankin (Stroke Patients Only)       Balance Overall balance assessment: Needs assistance Sitting-balance support: Feet supported;No upper extremity supported Sitting balance-Leahy Scale: Normal Sitting balance - Comments: steady sitting reaching outside BOS   Standing balance support: No upper extremity supported;During functional activity Standing balance-Leahy Scale: Good Standing balance comment: mildly unsteady ambulating no UE support but no overt loss of balance noted                             Pertinent Vitals/Pain  Pain Assessment: No/denies pain  Vitals (HR and O2 on room air) stable and WFL throughout treatment session.    Home Living Family/patient expects to be discharged to:: Private residence Living Arrangements: Children(Daughter, daughter's husband, and 2 and 5 y.o.) Available  Help at Discharge: Family;Available 24 hours/day Type of Home: House Home Access: Stairs to enter Entrance Stairs-Rails: Left Entrance Stairs-Number of Steps: 3 Home Layout: One level Home Equipment: None      Prior Function Level of Independence: Independent         Comments: Working full time at [employer]. Driving. Assisting with grandchildren.     Hand Dominance   Dominant Hand: Right    Extremity/Trunk Assessment   Upper Extremity Assessment Upper Extremity Assessment: Defer to OT evaluation    Lower Extremity Assessment Lower Extremity Assessment: RLE deficits/detail;LLE deficits/detail(intact B LE tone and proprioception) RLE Deficits / Details: hip flexion 4/5; knee flexion/extension 4/5; DF 4/5; decreased R LE coordination heel to shin (complicated d/t R LE weakness) LLE Deficits / Details: strength and ROM WFL; decreased sensation lateral L thigh    Cervical / Trunk Assessment Cervical / Trunk Assessment: Normal  Communication   Communication: No difficulties  Cognition Arousal/Alertness: Awake/alert Behavior During Therapy: WFL for tasks assessed/performed;Anxious Overall Cognitive Status: Within Functional Limits for tasks assessed                                 General Comments: Occasionally appearing anxious d/t current situation      General Comments  RW delivered to pt's room for home use and therapist adjusted it to appropriate height.  Gait training with vc's for AD use performed during session.    Exercises     Assessment/Plan    PT Assessment Patient needs continued PT services  PT Problem List Decreased strength;Decreased activity tolerance;Decreased balance;Decreased mobility;Decreased coordination;Decreased knowledge of use of DME;Decreased knowledge of precautions;Impaired sensation       PT Treatment Interventions DME instruction;Gait training;Stair training;Functional mobility training;Therapeutic activities;Therapeutic  exercise;Balance training;Neuromuscular re-education;Patient/family education    PT Goals (Current goals can be found in the Care Plan section)  Acute Rehab PT Goals Patient Stated Goal: to improve R side strength PT Goal Formulation: With patient Time For Goal Achievement: 08/19/18 Potential to Achieve Goals: Fair    Frequency 7X/week   Barriers to discharge        Co-evaluation               AM-PAC PT "6 Clicks" Mobility  Outcome Measure Help needed turning from your back to your side while in a flat bed without using bedrails?: None Help needed moving from lying on your back to sitting on the side of a flat bed without using bedrails?: None Help needed moving to and from a bed to a chair (including a wheelchair)?: None Help needed standing up from a chair using your arms (e.g., wheelchair or bedside chair)?: None Help needed to walk in hospital room?: A Little Help needed climbing 3-5 steps with a railing? : A Little 6 Click Score: 22    End of Session Equipment Utilized During Treatment: Gait belt Activity Tolerance: Patient tolerated treatment well Patient left: in chair;with call bell/phone within reach Nurse Communication: Mobility status;Precautions PT Visit Diagnosis: Unsteadiness on feet (R26.81);Muscle weakness (generalized) (M62.81);Difficulty in walking, not elsewhere classified (R26.2);Hemiplegia and hemiparesis Hemiplegia - Right/Left: Right Hemiplegia - dominant/non-dominant: Dominant Hemiplegia - caused by: Cerebral infarction    Time:  [id number] PT Time Calculation (min) (ACUTE ONLY): 32 min   Charges:   PT Evaluation $PT Eval Low Complexity: 1 Low PT Treatments $Gait Training: 23-37 mins       Hendricks Limesmily Ardyth Kelso, PT 08/05/18, 4:45 PM [phone]

## 2018-08-05 NOTE — TOC Transition Note (Signed)
Transition of Care [facility]) - CM/SW Discharge Note   Patient Details  Name: Tami Jones MRN: [id number] Date of Birth: 1955/09/17  [facility]) CM/SW Contact:  Rileyann Florance, Lenice Llamas Phone Number: [phone]  08/05/2018, 10:53 AM   Clinical Narrative: Clinical Social Worker (CSW) met with patient alone at bedside today to discuss D/C plan. Per patient she does not want to go to a SNF and wants to go home. CSW explained home health process and that CSW would have to find a home health agency in network with [place]. Patient is agreeable to home health and requested a rolling walker and bedside commode. [employer] agency representative is aware of above. Rolling walker and bedside commode has been delivered to patient's room. Per Helene Kelp [facility] representative they can accept patient with start care on Tuesday 6/23. Patient is aware of above and in agreement with [facility]. Per patient she lives in [place] with her adult daughter [name] and 2 grandchildren. Per patient her daughter can provide 24/7 assistance. RN aware of above. Please reconsult if future social work needs arise. CSW signing off.     Final next level of care: [place] Barriers to Discharge: Barriers Resolved   Patient Goals and CMS Choice Patient states their goals for this hospitalization and ongoing recovery are:: To improve mobility CMS Medicare.gov Compare Post Acute Care list provided to:: Patient Choice offered to / list presented to : Patient  Discharge Placement                       Discharge Plan and Services In-house Referral: Clinical Social Work Discharge Planning Services: CM Consult Post Acute Care Choice: Home Health          DME Arranged: Walker rolling, Bedside commode DME Agency: [employer] Date DME Agency Contacted: 08/05/18 Time DME Agency Contacted: 0930 Representative spoke with at DME Agency: [place]: PT, OT Register Agency: [facility] [employer] Date [place]: 08/05/18 Time [place]: 1000 Representative spoke with at [place]: [place] ([place]) Interventions     Readmission Risk Interventions No flowsheet data found.

## 2018-08-06 LAB — URINE CULTURE
Culture: >=100000 — AB
Special Requests: NORMAL

## 2018-08-07 ENCOUNTER — Telehealth: Payer: Self-pay

## 2018-08-07 NOTE — Telephone Encounter (Signed)
Note: pt wanted to schedule hospital f/u appt after neurology consult and after getting schedule for [facility] PT.

## 2018-08-07 NOTE — Telephone Encounter (Signed)
Transitional Care Management Follow-up Telephone Call    Date discharged? 08/05/18  How have you been since you were released from the hospital? [place].   Any patient concerns? Patient remains limited with ADLs.    Items Reviewed:  Medications reviewed: Yes  Allergies reviewed: Yes  Dietary changes reviewed: Yes  Referrals reviewed: Yes   Functional Questionnaire:  Independent - I Dependent - D    Activities of Daily Living (ADLs):    Personal hygiene - D Dressing - D Eating - I Maintaining continence - I Transferring -D -walker  Independent Activities of Daily Living (iADLs): Basic communication skills - I Transportation - D pt does not drive Meal preparation  - D Shopping - D Housework - D Managing medications - D Managing personal finances - I   Confirmed importance and date/time of follow-up visits scheduled YES  Provider Appointment booked with PCP 08/17/18 @ 1600  Confirmed with patient if condition begins to worsen call PCP or go to the ER.  Patient was given the office number and encouraged to call back with question or concerns: YES

## 2018-08-07 NOTE — Telephone Encounter (Signed)
Attempted to reach patient to complete TCM and to schedule hospital f/u appt. Attempt unsuccessful. Unable to message because mailbox is full.

## 2018-08-08 ENCOUNTER — Telehealth: Payer: Self-pay | Admitting: Internal Medicine

## 2018-08-08 NOTE — Telephone Encounter (Signed)
Darlina Guys, [facility] to let Dr.Letvak know she can start seeing patient for physical therapy on 08/11/18.

## 2018-08-08 NOTE — Telephone Encounter (Signed)
Best within 2 weeks with me---especially if neurology is not until after that time period

## 2018-08-08 NOTE — Telephone Encounter (Signed)
Pt stated [facility] faxed fmla paperwork 6/19  Had stroke 08/02/2018 [facility]  stated they put her out till  09/24/2018 Right /arm/legs weak

## 2018-08-09 DIAGNOSIS — Z0279 Encounter for issue of other medical certificate: Secondary | ICD-10-CM

## 2018-08-09 NOTE — Telephone Encounter (Signed)
okay

## 2018-08-09 NOTE — Telephone Encounter (Signed)
FMLA paperwork in dr Everardo Beals in box To be reviewed and signature

## 2018-08-09 NOTE — Telephone Encounter (Signed)
Paperwork faxed °

## 2018-08-09 NOTE — Telephone Encounter (Signed)
That looks good I have my follow up coming up with her signed

## 2018-08-10 NOTE — Telephone Encounter (Signed)
That is fine 

## 2018-08-10 NOTE — Telephone Encounter (Signed)
[place] nurse mgr with [facility] said pt was to be seen on 08/11/18 but was able to see pt today 08/10/18. This is just [place] does not need cb.

## 2018-08-11 ENCOUNTER — Telehealth: Payer: Self-pay

## 2018-08-11 NOTE — Telephone Encounter (Signed)
Tried calling pt mailbox full  Paperwork faxed 6/24 Copy for pt Copy for scan Copy for billing

## 2018-08-11 NOTE — Telephone Encounter (Signed)
Wes Reynolds PT with [facility] left v/m requesting verbal orders for [facility] PT 2 x a wk for 1 wk; 3 x a wk for 1 wk and 2 x a wk for 1 wk.

## 2018-08-12 NOTE — Telephone Encounter (Signed)
okay

## 2018-08-12 NOTE — Telephone Encounter (Signed)
Left verbal orders on VM. 

## 2018-08-15 ENCOUNTER — Telehealth: Payer: Self-pay | Admitting: Internal Medicine

## 2018-08-15 NOTE — Telephone Encounter (Signed)
I am going to have to hold these till my appointment with her on Thursday. I need to assess her functional status, etc

## 2018-08-15 NOTE — Telephone Encounter (Signed)
2 forms  On from [facility] FMLA  One from [place]  In dr letvak's in box

## 2018-08-16 ENCOUNTER — Telehealth: Payer: Self-pay | Admitting: Internal Medicine

## 2018-08-16 NOTE — Telephone Encounter (Signed)
Home Health Verbal Orders - Caller/Agency: Clair Gulling - [facility] Number: [phone] (can leave a voicemail) Requesting OT/PT/Skilled Nursing/Social Work/Speech Therapy:  Occupational therapy Frequency:  1x a week for 1 week 2x a week for 4 week

## 2018-08-17 ENCOUNTER — Ambulatory Visit (INDEPENDENT_AMBULATORY_CARE_PROVIDER_SITE_OTHER): Payer: Managed Care, Other (non HMO) | Admitting: Internal Medicine

## 2018-08-17 ENCOUNTER — Encounter: Payer: Self-pay | Admitting: Internal Medicine

## 2018-08-17 DIAGNOSIS — R319 Hematuria, unspecified: Secondary | ICD-10-CM | POA: Insufficient documentation

## 2018-08-17 DIAGNOSIS — I69351 Hemiplegia and hemiparesis following cerebral infarction affecting right dominant side: Secondary | ICD-10-CM | POA: Diagnosis not present

## 2018-08-17 DIAGNOSIS — K2100 Gastro-esophageal reflux disease with esophagitis, without bleeding: Secondary | ICD-10-CM

## 2018-08-17 DIAGNOSIS — I10 Essential (primary) hypertension: Secondary | ICD-10-CM | POA: Diagnosis not present

## 2018-08-17 DIAGNOSIS — E1165 Type 2 diabetes mellitus with hyperglycemia: Secondary | ICD-10-CM

## 2018-08-17 DIAGNOSIS — F39 Unspecified mood [affective] disorder: Secondary | ICD-10-CM

## 2018-08-17 DIAGNOSIS — R310 Gross hematuria: Secondary | ICD-10-CM

## 2018-08-17 DIAGNOSIS — G8191 Hemiplegia, unspecified affecting right dominant side: Secondary | ICD-10-CM | POA: Insufficient documentation

## 2018-08-17 DIAGNOSIS — Z0279 Encounter for issue of other medical certificate: Secondary | ICD-10-CM

## 2018-08-17 DIAGNOSIS — IMO0001 Reserved for inherently not codable concepts without codable children: Secondary | ICD-10-CM

## 2018-08-17 MED ORDER — CIPROFLOXACIN HCL 250 MG PO TABS: 250.0000 mg | ORAL_TABLET | Freq: Two times a day (BID) | ORAL | 1 refills | Status: DC

## 2018-08-17 MED ORDER — LANSOPRAZOLE 30 MG PO CPDR: 30.0000 mg | DELAYED_RELEASE_CAPSULE | Freq: Every day | ORAL | 11 refills | Status: DC

## 2018-08-17 NOTE — Assessment & Plan Note (Signed)
Anxiety and depression--if worsens will need to try therapy again

## 2018-08-17 NOTE — Assessment & Plan Note (Signed)
Having dysphagia since off PPI (not clear this is from stroke) Will change to prevacid

## 2018-08-17 NOTE — Assessment & Plan Note (Signed)
Discussed lifestyle changes to get A1c under 7.5% Will probably need another medication (money has always been an issue though)

## 2018-08-17 NOTE — Progress Notes (Signed)
Subjective:    Patient ID: Tami Jones, female    DOB: 07-25-1955, 63 y.o.   MRN: [id number]  HPI Virtual visit for hospital follow up Identification done Discussed billing and she gave consent She is at home and I am in my office  Reviewed hospital records, imaging and discharge summary Had arm weakness and finger numbness. They went limp Went home and DIL brought her to ER after calling triage Admitted for stroke--CT negative MRI showed left lacunar stroke Worsened in the hospital but now stabilized  Home for 12 days Still very fatigued Still weakness in dominant right arm and leg Some pain on right side--especially on neck Working with PT/OT  Daughter helping her with showering and dressing Trying to adjust to eating---some dysphagia (especially with the pills) Off omeprazole due to plavix apparently  Reviewed diabetes out of control Checking regularly now Now in 140's fasting ---and as low as 118  Current Outpatient Medications on File Prior to Visit  Medication Sig Dispense Refill  . amLODipine (NORVASC) 5 MG tablet Take 5 mg by mouth 2 (two) times daily.    Marland Kitchen aspirin EC 81 MG tablet Take 81 mg by mouth daily.    Marland Kitchen atorvastatin (LIPITOR) 80 MG tablet Take 1 tablet (80 mg total) by mouth daily. 30 tablet 0  . glipiZIDE (GLUCOTROL XL) 5 MG 24 hr tablet Take 1 tablet (5 mg total) by mouth daily with breakfast. 90 tablet 3  . lisinopril (PRINIVIL,ZESTRIL) 20 MG tablet TAKE 1 TABLET BY MOUTH DAILY 30 tablet 10  . clopidogrel (PLAVIX) 75 MG tablet Take 1 tablet by mouth daily.     No current facility-administered medications on file prior to visit.     Allergies  Allergen Reactions  . Citalopram Other (See Comments)    Feels odd  . Doxycycline Hyclate Nausea And Vomiting  . Erythromycin Other (See Comments)    Irritated stomach  . Montelukast Sodium Itching  . Nitrofurantoin Nausea And Vomiting  . Sulfa Antibiotics Nausea And Vomiting  . Tramadol Hcl Other  (See Comments)    Feels odd  . Venlafaxine Hcl Other (See Comments)    Feels odd  . Amoxicillin-Pot Clavulanate Nausea And Vomiting  . Clarithromycin Rash     See ER note 02/2017    Past Medical History:  Diagnosis Date  . Anxiety   . Asthma   . CVA (cerebral vascular accident) ([place]) 03/2006   right occipital, Dr. Doy Mince  . Diabetes mellitus   . GERD (gastroesophageal reflux disease)   . Hyperlipidemia   . Hypertension     Past Surgical History:  Procedure Laterality Date  . CARDIOVASCULAR STRESS TEST  1/15   myoview. EF 60%  . CESAREAN SECTION    . ESOPHAGOGASTRODUODENOSCOPY  05/2005  . TONSILLECTOMY AND ADENOIDECTOMY      Family History  Problem Relation Age of Onset  . Coronary artery disease Mother   . Diabetes Mellitus II Mother   . Heart attack Mother 37  . Diabetes Mellitus II Maternal Grandmother     Social History   Socioeconomic History  . Marital status: Divorced    Spouse name: Not on file  . Number of children: 2  . Years of education: Not on file  . Highest education level: Not on file  Occupational History  . Occupation: LAB TECH    Employer: [employer]  . Financial resource strain: Not on file  . Food insecurity    Worry: Not on file  Inability: Not on file  . Transportation needs    Medical: Not on file    Non-medical: Not on file  Tobacco Use  . Smoking status: Never Smoker  . Smokeless tobacco: Never Used  Substance and Sexual Activity  . Alcohol use: No    Alcohol/week: 0.0 standard drinks    Comment: wine occassionally  . Drug use: No  . Sexual activity: Yes    Birth control/protection: Condom  Lifestyle  . Physical activity    Days per week: Not on file    Minutes per session: Not on file  . Stress: Not on file  Relationships  . Social Musicianconnections    Talks on phone: Not on file    Gets together: Not on file    Attends religious service: Not on file    Active member of club or organization: Not on file     Attends meetings of clubs or organizations: Not on file    Relationship status: Not on file  . Intimate partner violence    Fear of current or ex partner: Not on file    Emotionally abused: Not on file    Physically abused: Not on file    Forced sexual activity: Not on file  Other Topics Concern  . Not on file  Social History Narrative  . Not on file   Review of Systems Speaking okay Mild confusion at times---mostly okay with cognition Anxiety and depression are worse Not sleeping well either Was treated for UTI--but now with some blood in urine. Pain in "right kidney" Was having diarrhea --now constipated    Objective:   Physical Exam  Constitutional: She appears well-developed. No distress.  Respiratory: Effort normal. No respiratory distress.  Musculoskeletal:        General: No edema.  Neurological:  Trouble lifting right arm---but can get it to shoulder Walks with walker--favors right leg           Assessment & Plan:

## 2018-08-17 NOTE — Telephone Encounter (Signed)
Left orders on VM for Tami Jones

## 2018-08-17 NOTE — Assessment & Plan Note (Addendum)
Mild right side weakness Mild cognitive issues Getting home PT/OT Daughter helping with ADLs Disabled for now--will certify through early August but may need longer ASA/plavix and statin Will do paperwork for this

## 2018-08-17 NOTE — Assessment & Plan Note (Signed)
Just treated for UTI Only other option for treatment is cipro--will send Rx She will let me know if it doesn't resolve

## 2018-08-17 NOTE — Telephone Encounter (Signed)
okay

## 2018-08-17 NOTE — Assessment & Plan Note (Signed)
BP Readings from Last 3 Encounters:  08/17/18 130/77  08/05/18 (!) 152/65  04/13/18 (!) 169/90   Control is better

## 2018-08-22 DIAGNOSIS — E119 Type 2 diabetes mellitus without complications: Secondary | ICD-10-CM | POA: Diagnosis not present

## 2018-08-22 DIAGNOSIS — E785 Hyperlipidemia, unspecified: Secondary | ICD-10-CM

## 2018-08-22 DIAGNOSIS — K219 Gastro-esophageal reflux disease without esophagitis: Secondary | ICD-10-CM

## 2018-08-22 DIAGNOSIS — I10 Essential (primary) hypertension: Secondary | ICD-10-CM

## 2018-08-22 DIAGNOSIS — N390 Urinary tract infection, site not specified: Secondary | ICD-10-CM

## 2018-08-22 DIAGNOSIS — Z7984 Long term (current) use of oral hypoglycemic drugs: Secondary | ICD-10-CM

## 2018-08-22 DIAGNOSIS — J45909 Unspecified asthma, uncomplicated: Secondary | ICD-10-CM

## 2018-08-22 DIAGNOSIS — Z9181 History of falling: Secondary | ICD-10-CM

## 2018-08-22 DIAGNOSIS — F419 Anxiety disorder, unspecified: Secondary | ICD-10-CM

## 2018-08-22 DIAGNOSIS — I69351 Hemiplegia and hemiparesis following cerebral infarction affecting right dominant side: Secondary | ICD-10-CM | POA: Diagnosis not present

## 2018-08-22 NOTE — Telephone Encounter (Signed)
Abigail Butts from, [place] [facility] called to get the last office note faxed to [phone].

## 2018-08-24 ENCOUNTER — Other Ambulatory Visit: Payer: Self-pay | Admitting: Internal Medicine

## 2018-08-25 NOTE — Telephone Encounter (Signed)
Pt aware.

## 2018-08-25 NOTE — Telephone Encounter (Signed)
Copy for scan Copy for pt Copy for billing 

## 2018-09-04 ENCOUNTER — Telehealth: Payer: Self-pay | Admitting: Internal Medicine

## 2018-09-04 NOTE — Telephone Encounter (Signed)
That is fine 

## 2018-09-04 NOTE — Telephone Encounter (Signed)
[employer] called requesting orders to continue home health physical therapy 2 x a week for 3 weeks and then 1 x a week for 3 weeks.

## 2018-09-04 NOTE — Telephone Encounter (Signed)
Verbal orders left on VM for [facility]

## 2018-09-15 ENCOUNTER — Telehealth: Payer: Self-pay

## 2018-09-15 NOTE — Telephone Encounter (Signed)
Verbal orders left on VM 

## 2018-09-15 NOTE — Telephone Encounter (Signed)
Ok for OT as requested 

## 2018-09-15 NOTE — Telephone Encounter (Signed)
Izora Gala OT with [facility] left v/m requesting verbal orders for extension of HH OT 1 x a wk for 1 wk and 2 x a wk for 2 wks to continue upper extremities neuro reeducation after CVA.

## 2018-09-20 ENCOUNTER — Other Ambulatory Visit: Payer: Self-pay | Admitting: Internal Medicine

## 2018-09-21 ENCOUNTER — Other Ambulatory Visit: Payer: Self-pay

## 2018-09-21 ENCOUNTER — Ambulatory Visit: Payer: Managed Care, Other (non HMO) | Admitting: Internal Medicine

## 2018-09-21 ENCOUNTER — Encounter: Payer: Self-pay | Admitting: Internal Medicine

## 2018-09-21 DIAGNOSIS — I69351 Hemiplegia and hemiparesis following cerebral infarction affecting right dominant side: Secondary | ICD-10-CM

## 2018-09-21 DIAGNOSIS — E1165 Type 2 diabetes mellitus with hyperglycemia: Secondary | ICD-10-CM | POA: Diagnosis not present

## 2018-09-21 DIAGNOSIS — F321 Major depressive disorder, single episode, moderate: Secondary | ICD-10-CM | POA: Diagnosis not present

## 2018-09-21 DIAGNOSIS — IMO0001 Reserved for inherently not codable concepts without codable children: Secondary | ICD-10-CM

## 2018-09-21 MED ORDER — CITALOPRAM HYDROBROMIDE 20 MG PO TABS: 20.0000 mg | ORAL_TABLET | Freq: Every day | ORAL | 3 refills | Status: DC

## 2018-09-21 MED ORDER — ATORVASTATIN CALCIUM 80 MG PO TABS: 80.0000 mg | ORAL_TABLET | Freq: Every day | ORAL | 3 refills | Status: DC

## 2018-09-21 NOTE — Assessment & Plan Note (Signed)
Related to stroke now--but chronic past problems Will restart the citalopram

## 2018-09-21 NOTE — Assessment & Plan Note (Addendum)
Slight improvement but still disabled Will need to seek SSI disability at this point Continue ASA, plavix &statin Also with right arm pain --seems to be secondary to the stroke

## 2018-09-21 NOTE — Assessment & Plan Note (Signed)
Sugars are better now Will recheck at next visit

## 2018-09-21 NOTE — Progress Notes (Signed)
Subjective:    Patient ID: Tami Jones, female    DOB: 05/16/1955, 63 y.o.   MRN: [id number]  HPI Here with daughter for follow up of stroke Still with home PT/OT--total of 3 sessions per week Then will plan on doing outpatient therapy (referring to neuroPT in [place])  Still with balance problems Arm is improved some--but still needs to use her non dominant hand for ADLs Has been taking FMLA---afraid of disability alone due to her job not being safe on that FMLA done 8/23  Working on eating better Sugars have come down some---usually under 120  Has been very depressed Not considering suicide Daily sadness---worthless feelings, etc Has been on citalopram in the past--but off for over a year (because she was doing well) Easily exhausted  Also with pain in right shoulder and arm Therapy aggravates it Tried tylenol --no clear help  Current Outpatient Medications on File Prior to Visit  Medication Sig Dispense Refill  . amLODipine (NORVASC) 5 MG tablet Take 5 mg by mouth 2 (two) times daily.    Marland Kitchen. aspirin EC 81 MG tablet Take 81 mg by mouth daily.    Marland Kitchen. atorvastatin (LIPITOR) 80 MG tablet Take 1 tablet (80 mg total) by mouth daily. 30 tablet 0  . clopidogrel (PLAVIX) 75 MG tablet Take 1 tablet by mouth daily.    Marland Kitchen. glipiZIDE (GLUCOTROL XL) 5 MG 24 hr tablet TAKE 1 TABLET BY MOUTH EVERYDAY WITH BREAKFAST 90 tablet 3  . lansoprazole (PREVACID) 30 MG capsule Take 1 capsule (30 mg total) by mouth daily at 12 noon. 30 capsule 11  . lisinopril (PRINIVIL,ZESTRIL) 20 MG tablet TAKE 1 TABLET BY MOUTH DAILY 30 tablet 10  . nystatin (MYCOSTATIN/NYSTOP) powder APPLY EXTERNALLY TO THE AFFECTED AREA TWICE DAILY 60 g 0   No current facility-administered medications on file prior to visit.     Allergies  Allergen Reactions  . Citalopram Other (See Comments)    Feels odd  . Doxycycline Hyclate Nausea And Vomiting  . Erythromycin Other (See Comments)    Irritated stomach  . Montelukast  Sodium Itching  . Nitrofurantoin Nausea And Vomiting  . Sulfa Antibiotics Nausea And Vomiting  . Tramadol Hcl Other (See Comments)    Feels odd  . Venlafaxine Hcl Other (See Comments)    Feels odd  . Amoxicillin-Pot Clavulanate Nausea And Vomiting  . Clarithromycin Rash     See ER note 02/2017    Past Medical History:  Diagnosis Date  . Anxiety   . Asthma   . 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 Thad Rangereynolds  . Diabetes mellitus   . GERD (gastroesophageal reflux disease)   . Hyperlipidemia   . Hypertension     Past Surgical History:  Procedure Laterality Date  . CARDIOVASCULAR STRESS TEST  1/15   myoview. EF 60%  . CESAREAN SECTION    . ESOPHAGOGASTRODUODENOSCOPY  05/2005  . TONSILLECTOMY AND ADENOIDECTOMY      Family History  Problem Relation Age of Onset  . Coronary artery disease Mother   . Diabetes Mellitus II Mother   . Heart attack Mother 763  . Diabetes Mellitus II Maternal Grandmother     Social History   Socioeconomic History  . Marital status: Divorced    Spouse name: Not on file  . Number of children: 2  . Years of education: Not on file  . Highest education level: Not on file  Occupational History  . Occupation: LAB [employer]  Employer: [employer]  Social Needs  . Financial resource strain: Not on file  . Food insecurity    Worry: Not on file    Inability: Not on file  . Transportation needs    Medical: Not on file    Non-medical: Not on file  Tobacco Use  . Smoking status: Never Smoker  . Smokeless tobacco: Never Used  Substance and Sexual Activity  . Alcohol use: No    Alcohol/week: 0.0 standard drinks    Comment: wine occassionally  . Drug use: No  . Sexual activity: Yes    Birth control/protection: Condom  Lifestyle  . Physical activity    Days per week: Not on file    Minutes per session: Not on file  . Stress: Not on file  Relationships  . Social Herbalist on phone: Not on file    Gets  together: Not on file    Attends religious service: Not on file    Active member of club or organization: Not on file    Attends meetings of clubs or organizations: Not on file    Relationship status: Not on file  . Intimate partner violence    Fear of current or ex partner: Not on file    Emotionally abused: Not on file    Physically abused: Not on file    Forced sexual activity: Not on file  Other Topics Concern  . Not on file  Social History Narrative  . Not on file   Review of Systems Having urinary incontinence--thinks it is related to her pain Appetite is okay No weight loss Not sleeping well    Objective:   Physical Exam  Constitutional: No distress.  Neck: No thyromegaly present.  Cardiovascular: Normal rate, regular rhythm and normal heart sounds. Exam reveals no gallop.  No murmur heard. Respiratory: Effort normal and breath sounds normal. No respiratory distress. She has no wheezes. She has no rales.  GI: Soft. There is no abdominal tenderness.  Lymphadenopathy:    She has no cervical adenopathy.  Neurological:  Right leg weak---favors it with slow walking with walker Unable to raise right arm above shoulder--trouble even grabbing pen in front of her face. Slow, shaky handwriting  Psychiatric:  Tearful and depressed Constricted affect No suicidal ideation           Assessment & Plan:

## 2018-09-21 NOTE — Patient Instructions (Signed)
Please start the citalopram at 1/2 tab daily for the first 6 days--and if no problems, then increase to the full tablet.

## 2018-09-25 ENCOUNTER — Ambulatory Visit: Payer: Managed Care, Other (non HMO) | Admitting: Internal Medicine

## 2018-09-25 ENCOUNTER — Telehealth: Payer: Self-pay | Admitting: Internal Medicine

## 2018-09-25 NOTE — Telephone Encounter (Signed)
[facility] faxed attending provider statement In dr Everardo Beals in box

## 2018-09-25 NOTE — Telephone Encounter (Signed)
No charge I don't think she is going to be able to go back to work---I put down 9/30 as a temporary date and I was planning to see her back in a month to review

## 2018-09-29 ENCOUNTER — Telehealth: Payer: Self-pay | Admitting: Internal Medicine

## 2018-09-29 NOTE — Telephone Encounter (Signed)
Form done Attach last 2 notes---let her know that they have asked for these (and I mention she will need SSI disability) No charge

## 2018-09-29 NOTE — Telephone Encounter (Signed)
Paperwork faxed 8/11 °

## 2018-09-29 NOTE — Telephone Encounter (Signed)
[facility] faxed paperwork needing additional information  Paperwork in dr letvak's in box For review and signature

## 2018-10-02 NOTE — Telephone Encounter (Signed)
Paperwork faxed °

## 2018-10-02 NOTE — Telephone Encounter (Signed)
Tried calling pt  Voicemail full °

## 2018-10-03 ENCOUNTER — Telehealth: Payer: Self-pay | Admitting: Internal Medicine

## 2018-10-03 DIAGNOSIS — I69351 Hemiplegia and hemiparesis following cerebral infarction affecting right dominant side: Secondary | ICD-10-CM

## 2018-10-03 NOTE — Telephone Encounter (Signed)
Patient will be discharged from physical therapy tomorrow. Erasmo Downer, [facility], recommended the family request physical therapy and occupational therapy at [facility].  Refer for balance, walking, shoulder and total right arm. They need 2 separate orders for pt and ot.  The fax number is [phone].

## 2018-10-03 NOTE — Telephone Encounter (Signed)
Referral placed.

## 2018-10-04 NOTE — Telephone Encounter (Signed)
Copy for pt °Copy for scan °

## 2018-10-24 LAB — HM DIABETES EYE EXAM

## 2018-10-26 ENCOUNTER — Encounter: Payer: Self-pay | Admitting: Internal Medicine

## 2018-10-26 ENCOUNTER — Ambulatory Visit: Payer: Managed Care, Other (non HMO) | Admitting: Internal Medicine

## 2018-10-26 ENCOUNTER — Other Ambulatory Visit: Payer: Self-pay

## 2018-10-26 DIAGNOSIS — Z23 Encounter for immunization: Secondary | ICD-10-CM | POA: Diagnosis not present

## 2018-10-26 DIAGNOSIS — M792 Neuralgia and neuritis, unspecified: Secondary | ICD-10-CM | POA: Insufficient documentation

## 2018-10-26 DIAGNOSIS — I69351 Hemiplegia and hemiparesis following cerebral infarction affecting right dominant side: Secondary | ICD-10-CM | POA: Diagnosis not present

## 2018-10-26 DIAGNOSIS — E1159 Type 2 diabetes mellitus with other circulatory complications: Secondary | ICD-10-CM

## 2018-10-26 DIAGNOSIS — I10 Essential (primary) hypertension: Secondary | ICD-10-CM | POA: Diagnosis not present

## 2018-10-26 DIAGNOSIS — F39 Unspecified mood [affective] disorder: Secondary | ICD-10-CM

## 2018-10-26 MED ORDER — NYSTATIN 100000 UNIT/GM EX POWD: CUTANEOUS | 3 refills | Status: DC

## 2018-10-26 MED ORDER — GABAPENTIN 300 MG PO CAPS: 300.0000 mg | ORAL_CAPSULE | Freq: Every day | ORAL | 3 refills | Status: DC

## 2018-10-26 NOTE — Assessment & Plan Note (Signed)
Somewhat better back on the citalopram

## 2018-10-26 NOTE — Assessment & Plan Note (Signed)
Secondary stroke Not sure if controlled---will work on diet and check next time (hasn't been 3 months yet)

## 2018-10-26 NOTE — Progress Notes (Signed)
Subjective:    Patient ID: Tami Jones, female    DOB: 04/03/1955, 63 y.o.   MRN: [id number]  HPI Here for follow up of stroke  She thinks she may be "a tad bit better" Still has bad pain in right arm and shoulder Keeps up with the exercises ---- but it doesn't help the pain Will be starting outpatient therapy later this month  Injured left thigh trying to get off commode Felt a pop--- and pain since then  Still uses walker Showers with chair and rail---stand by assist Bathroom by herself--but hard time transferring (needs to start using the 3 in 1 addition for a higher seat) Hard transferring out of couch Needs rails to go down a few steps Can't do instrumental ADLs Does plan to retire in December--will need to be on disability until then (near the end)  Did restart the citalopram It has helped the depression but makes her sleepy (but she does take it at night)  Checking sugars ~once a week Usually does random checks and are under 200  BP has been okay  Current Outpatient Medications on File Prior to Visit  Medication Sig Dispense Refill  . amLODipine (NORVASC) 5 MG tablet Take 5 mg by mouth 2 (two) times daily.    Marland Kitchen. aspirin EC 81 MG tablet Take 81 mg by mouth daily.    Marland Kitchen. atorvastatin (LIPITOR) 80 MG tablet Take 1 tablet (80 mg total) by mouth daily. 90 tablet 3  . citalopram (CELEXA) 20 MG tablet Take 1 tablet (20 mg total) by mouth daily. 90 tablet 3  . clopidogrel (PLAVIX) 75 MG tablet Take 1 tablet by mouth daily.    Marland Kitchen. glipiZIDE (GLUCOTROL XL) 5 MG 24 hr tablet TAKE 1 TABLET BY MOUTH EVERYDAY WITH BREAKFAST 90 tablet 3  . lansoprazole (PREVACID) 30 MG capsule Take 1 capsule (30 mg total) by mouth daily at 12 noon. 30 capsule 11  . lisinopril (PRINIVIL,ZESTRIL) 20 MG tablet TAKE 1 TABLET BY MOUTH DAILY 30 tablet 10  . nystatin (MYCOSTATIN/NYSTOP) powder APPLY EXTERNALLY TO THE AFFECTED AREA TWICE DAILY 60 g 0   No current facility-administered medications on file  prior to visit.     Allergies  Allergen Reactions  . Citalopram Other (See Comments)    Feels odd  . Doxycycline Hyclate Nausea And Vomiting  . Erythromycin Other (See Comments)    Irritated stomach  . Montelukast Sodium Itching  . Nitrofurantoin Nausea And Vomiting  . Sulfa Antibiotics Nausea And Vomiting  . Tramadol Hcl Other (See Comments)    Feels odd  . Venlafaxine Hcl Other (See Comments)    Feels odd  . Amoxicillin-Pot Clavulanate Nausea And Vomiting  . Clarithromycin Rash     See ER note 02/2017    Past Medical History:  Diagnosis Date  . Anxiety   . Asthma   . 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 Thad Rangereynolds  . Diabetes mellitus   . GERD (gastroesophageal reflux disease)   . Hyperlipidemia   . Hypertension     Past Surgical History:  Procedure Laterality Date  . CARDIOVASCULAR STRESS TEST  1/15   myoview. EF 60%  . CESAREAN SECTION    . ESOPHAGOGASTRODUODENOSCOPY  05/2005  . TONSILLECTOMY AND ADENOIDECTOMY      Family History  Problem Relation Age of Onset  . Coronary artery disease Mother   . Diabetes Mellitus II Mother   . Heart attack Mother [id number]  . Diabetes Mellitus II  Maternal Grandmother     Social History   Socioeconomic History  . Marital status: Divorced    Spouse name: Not on file  . Number of children: 2  . Years of education: Not on file  . Highest education level: Not on file  Occupational History  . Occupation: LAB TECH    Employer: [employer]  . Financial resource strain: Not on file  . Food insecurity    Worry: Not on file    Inability: Not on file  . Transportation needs    Medical: Not on file    Non-medical: Not on file  Tobacco Use  . Smoking status: Never Smoker  . Smokeless tobacco: Never Used  Substance and Sexual Activity  . Alcohol use: No    Alcohol/week: 0.0 standard drinks    Comment: wine occassionally  . Drug use: No  . Sexual activity: Yes    Birth  control/protection: Condom  Lifestyle  . Physical activity    Days per week: Not on file    Minutes per session: Not on file  . Stress: Not on file  Relationships  . Social Herbalist on phone: Not on file    Gets together: Not on file    Attends religious service: Not on file    Active member of club or organization: Not on file    Attends meetings of clubs or organizations: Not on file    Relationship status: Not on file  . Intimate partner violence    Fear of current or ex partner: Not on file    Emotionally abused: Not on file    Physically abused: Not on file    Forced sexual activity: Not on file  Other Topics Concern  . Not on file  Social History Narrative  . Not on file   Review of Systems Mild swelling in right leg---"still feels like it isn't mine" Appetite is okay--but variable Weight is about the same    Objective:   Physical Exam  Constitutional: No distress.  Neck: No thyromegaly present.  Cardiovascular: Normal rate, regular rhythm and normal heart sounds. Exam reveals no gallop.  No murmur heard. Respiratory: Effort normal and breath sounds normal. No respiratory distress. She has no wheezes. She has no rales.  Musculoskeletal:        General: No edema.     Comments: Fairly good internal and external rotation of left hip. Pain is in quad  Lymphadenopathy:    She has no cervical adenopathy.  Psychiatric:  Mood is better--but still depressed about all of her condition           Assessment & Plan:

## 2018-10-26 NOTE — Assessment & Plan Note (Signed)
Related to CVA Will try gabapentin at bedtime

## 2018-10-26 NOTE — Assessment & Plan Note (Signed)
BP Readings from Last 3 Encounters:  10/26/18 130/76  09/21/18 124/80  08/17/18 130/77   Good control

## 2018-10-26 NOTE — Assessment & Plan Note (Signed)
Ongoing disability Has put in for retirement--will need disability till that happens (then apply to SSI) plavix and statin

## 2018-10-26 NOTE — Addendum Note (Signed)
Addended by: Pilar Grammes on: 10/26/2018 02:44 PM   Modules accepted: Orders

## 2018-10-30 ENCOUNTER — Encounter: Payer: Self-pay | Admitting: Family Medicine

## 2018-10-30 ENCOUNTER — Ambulatory Visit: Payer: Managed Care, Other (non HMO) | Admitting: Family Medicine

## 2018-10-30 ENCOUNTER — Other Ambulatory Visit: Payer: Self-pay

## 2018-10-30 VITALS — BP 142/82 | HR 82 | Temp 97.8°F | Ht 63.0 in | Wt 237.0 lb

## 2018-10-30 DIAGNOSIS — R300 Dysuria: Secondary | ICD-10-CM

## 2018-10-30 DIAGNOSIS — N390 Urinary tract infection, site not specified: Secondary | ICD-10-CM

## 2018-10-30 LAB — POC URINALSYSI DIPSTICK (AUTOMATED)
Bilirubin, UA: 1
Blood, UA: 100
Glucose, UA: POSITIVE — AB
Ketones, UA: 5
Leukocytes, UA: NEGATIVE
Nitrite, UA: NEGATIVE
Protein, UA: POSITIVE — AB
Spec Grav, UA: >=1.03 — AB (ref 1.010–1.025)
Urobilinogen, UA: 0.2 E.U./dL
pH, UA: 6 (ref 5.0–8.0)

## 2018-10-30 MED ORDER — CIPROFLOXACIN HCL 250 MG PO TABS: 250.0000 mg | ORAL_TABLET | Freq: Two times a day (BID) | ORAL | 0 refills | Status: DC

## 2018-10-30 NOTE — Progress Notes (Signed)
Subjective:    Patient ID: Tami Jones, female    DOB: 06/07/1955, 63 y.o.   MRN: [id number]  HPI 63 yo pt of Dr Alphonsus SiasLetvak here with urinary symptoms  Started this am  Some blood in urine - bright red blood when she wiped and then some in commode  Burns to urinate  Bladder is throbbing  Frequency of urination and urgency  Poor appetite A little nauseated  No fever (felt cold yesterday but no fever)    Has some issues with urgency after     Pt had a klebsiella uti in June (when she was in the hospital)   Ua: Results for orders placed or performed in visit on 10/30/18  POCT Urinalysis Dipstick (Automated)  Result Value Ref Range   Color, UA Amber    Clarity, UA Cloudy    Glucose, UA Positive (A) Negative   Bilirubin, UA 1 mg/dL    Ketones, UA 5 mg/dL    Spec Grav, UA >=4.098>=1.030 (A) 1.010 - 1.025   Blood, UA 100 Ery/uL    pH, UA 6.0 5.0 - 8.0   Protein, UA Positive (A) Negative   Urobilinogen, UA 0.2 0.2 or 1.0 E.U./dL   Nitrite, UA Negative    Leukocytes, UA Negative Negative      Patient Active Problem List   Diagnosis Date Noted  . Neuropathic pain, arm 10/26/2018  . MDD (major depressive disorder), single episode, moderate (HCC) 09/21/2018  . Hemiparesis of right dominant side (HCC) 08/17/2018  . Hematuria 08/17/2018  . Pleuritic chest pain 12/23/2017  . SOB (shortness of breath) 03/08/2017  . Vestibular dizziness 01/21/2016  . Tick bite 07/15/2015  . Cerebrovascular disease 07/17/2014  . Preventative health care 06/25/2014  . UTI (urinary tract infection) 06/25/2014  . Right sided sciatica 03/08/2013  . MENOPAUSAL SYNDROME 09/10/2008  . Mood disorder (HCC) 09/15/2007  . Essential hypertension, benign 08/10/2006  . Type 2 diabetes mellitus with other circulatory complications (HCC) 06/17/2006  . HYPERCHOLESTEROLEMIA 06/17/2006  . GERD 06/17/2006   Past Medical History:  Diagnosis Date  . Anxiety   . Asthma   . CVA (cerebral vascular accident) (HCC)  03/2006   right occipital, Dr. Thad Rangereynolds  . Diabetes mellitus   . GERD (gastroesophageal reflux disease)   . Hyperlipidemia   . Hypertension    Past Surgical History:  Procedure Laterality Date  . CARDIOVASCULAR STRESS TEST  1/15   myoview. EF 60%  . CESAREAN SECTION    . ESOPHAGOGASTRODUODENOSCOPY  05/2005  . TONSILLECTOMY AND ADENOIDECTOMY     Social History   Tobacco Use  . Smoking status: Never Smoker  . Smokeless tobacco: Never Used  Substance Use Topics  . Alcohol use: No    Alcohol/week: 0.0 standard drinks    Comment: wine occassionally  . Drug use: No   Family History  Problem Relation Age of Onset  . Coronary artery disease Mother   . Diabetes Mellitus II Mother   . Heart attack Mother 2963  . Diabetes Mellitus II Maternal Grandmother    Allergies  Allergen Reactions  . Citalopram Other (See Comments)    Feels odd  . Doxycycline Hyclate Nausea And Vomiting  . Erythromycin Other (See Comments)    Irritated stomach  . Montelukast Sodium Itching  . Nitrofurantoin Nausea And Vomiting  . Sulfa Antibiotics Nausea And Vomiting  . Tramadol Hcl Other (See Comments)    Feels odd  . Venlafaxine Hcl Other (See Comments)    Feels odd  .  Amoxicillin-Pot Clavulanate Nausea And Vomiting  . Clarithromycin Rash     See ER note 02/2017   Current Outpatient Medications on File Prior to Visit  Medication Sig Dispense Refill  . amLODipine (NORVASC) 5 MG tablet Take 5 mg by mouth 2 (two) times daily.    Marland Kitchen aspirin EC 81 MG tablet Take 81 mg by mouth daily.    Marland Kitchen atorvastatin (LIPITOR) 80 MG tablet Take 1 tablet (80 mg total) by mouth daily. 90 tablet 3  . citalopram (CELEXA) 20 MG tablet Take 1 tablet (20 mg total) by mouth daily. 90 tablet 3  . clopidogrel (PLAVIX) 75 MG tablet Take 1 tablet by mouth daily.    Marland Kitchen gabapentin (NEURONTIN) 300 MG capsule Take 1 capsule (300 mg total) by mouth at bedtime. Increase to 2 capsules in ineffective. 60 capsule 3  . glipiZIDE (GLUCOTROL  XL) 5 MG 24 hr tablet TAKE 1 TABLET BY MOUTH EVERYDAY WITH BREAKFAST 90 tablet 3  . lansoprazole (PREVACID) 30 MG capsule Take 1 capsule (30 mg total) by mouth daily at 12 noon. 30 capsule 11  . lisinopril (PRINIVIL,ZESTRIL) 20 MG tablet TAKE 1 TABLET BY MOUTH DAILY 30 tablet 10  . nystatin (MYCOSTATIN/NYSTOP) powder APPLY EXTERNALLY TO THE AFFECTED AREA TWICE DAILY 60 g 3   No current facility-administered medications on file prior to visit.     Review of Systems  Constitutional: Negative for activity change, appetite change, fatigue, fever and unexpected weight change.  HENT: Negative for congestion, ear pain, rhinorrhea, sinus pressure and sore throat.   Eyes: Negative for pain, redness and visual disturbance.  Respiratory: Negative for cough, shortness of breath and wheezing.   Cardiovascular: Negative for chest pain and palpitations.  Gastrointestinal: Negative for abdominal pain, blood in stool, constipation and diarrhea.  Endocrine: Negative for polydipsia and polyuria.  Genitourinary: Positive for dysuria, frequency and hematuria. Negative for urgency.  Musculoskeletal: Positive for back pain. Negative for arthralgias and myalgias.  Skin: Negative for pallor and rash.  Allergic/Immunologic: Negative for environmental allergies.  Neurological: Negative for dizziness, syncope and headaches.  Hematological: Negative for adenopathy. Does not bruise/bleed easily.  Psychiatric/Behavioral: Negative for decreased concentration and dysphoric mood. The patient is not nervous/anxious.        Objective:   Physical Exam Constitutional:      General: She is not in acute distress.    Appearance: She is well-developed. She is obese. She is not ill-appearing.  HENT:     Head: Normocephalic and atraumatic.  Eyes:     Conjunctiva/sclera: Conjunctivae normal.     Pupils: Pupils are equal, round, and reactive to light.  Neck:     Musculoskeletal: Normal range of motion and neck supple.   Cardiovascular:     Rate and Rhythm: Normal rate and regular rhythm.     Heart sounds: Normal heart sounds.  Pulmonary:     Effort: Pulmonary effort is normal.     Breath sounds: Normal breath sounds.  Abdominal:     General: Bowel sounds are normal. There is no distension.     Palpations: Abdomen is soft.     Tenderness: There is abdominal tenderness. There is no right CVA tenderness, left CVA tenderness or rebound.     Comments: No cva tenderness  Mild suprapubic tenderness  Lymphadenopathy:     Cervical: No cervical adenopathy.  Skin:    Coloration: Skin is not jaundiced.     Findings: No rash.  Neurological:     Mental Status: She is  alert.  Psychiatric:        Mood and Affect: Mood normal.           Assessment & Plan:   Problem List Items Addressed This Visit      Genitourinary   UTI (urinary tract infection) - Primary    With hematuria and dysuria  ua did not have leukocytes however  Enc water intake and blood sugar control  Ucx pending (if neg-may need urology attn for hematuria)  tx with 5 d of cipro (intol to most medicines) inst to call if symptoms worsen        Relevant Orders   Urine Culture    Other Visit Diagnoses    Dysuria       Relevant Orders   POCT Urinalysis Dipstick (Automated) (Completed)

## 2018-10-30 NOTE — Patient Instructions (Addendum)
Take cipro as directed  Drink lots of fluids -your urine looks concentrated  We will culture urine and contact you with a result   If symptoms worsen please let me know

## 2018-10-30 NOTE — Assessment & Plan Note (Signed)
With hematuria and dysuria  ua did not have leukocytes however  Enc water intake and blood sugar control  Ucx pending (if neg-may need urology attn for hematuria)  tx with 5 d of cipro (intol to most medicines) inst to call if symptoms worsen

## 2018-10-31 ENCOUNTER — Telehealth: Payer: Self-pay | Admitting: Internal Medicine

## 2018-10-31 NOTE — Telephone Encounter (Signed)
I completed the FMLA paperwork and faxed to [employer].  I do not have any requests for medical records.  Fax confirmation is attached to the forms. If they are asking for records then we need to go through medical records.

## 2018-10-31 NOTE — Telephone Encounter (Signed)
[employer] called in regards to getting office notes. Advised that Shirlean Mylar is out but we are working on trying to get these for the patient   Fax- [phone]

## 2018-10-31 NOTE — Telephone Encounter (Signed)
Dr Silvio Pate does not have them. Robin usually does the [employer]. I have no idea what to do.

## 2018-10-31 NOTE — Telephone Encounter (Signed)
Patient called about her FMLA paperwork. [employer] is waiting for note from last Thursday's visit.  Patient said it should state that patient's out of work from 10/18/18-11/14/17.  Patient said it needs to be sent back to [facility] as soon as possible.

## 2018-11-01 ENCOUNTER — Telehealth: Payer: Self-pay | Admitting: *Deleted

## 2018-11-01 LAB — URINE CULTURE
MICRO NUMBER:: 877636
SPECIMEN QUALITY:: ADEQUATE

## 2018-11-01 MED ORDER — CEPHALEXIN 500 MG PO CAPS: 500.0000 mg | ORAL_CAPSULE | Freq: Two times a day (BID) | ORAL | 0 refills | Status: DC

## 2018-11-01 NOTE — Telephone Encounter (Signed)
-----   Message from Abner Greenspan, MD sent at 11/01/2018 12:58 PM EDT ----- E coli uti Resistant to some penicillins and also cipro/levaquin and sulfa abx  An option would be keflex   If pt is ok with that please send it in keflex 500 mg 1 po bid for 7d #14 no ref Alert [place] if problems or no improvement   There is a cross rxn between this and pcn however only if the pcn rxn is a true allergy (looks like she gets n/v with augmentin and this would not count)

## 2018-11-01 NOTE — Telephone Encounter (Signed)
Pt notified of urine cx and Dr. Marliss Coots comments. Pt has taken keflex in the past with no problems. Pt said sxs have worsen I advised her she was not on correct abx so new abx sent to pharmacy and pt advised to keep [place] posted if sxs don't resolve after starting new abx

## 2018-11-07 ENCOUNTER — Ambulatory Visit: Payer: Managed Care, Other (non HMO) | Admitting: Occupational Therapy

## 2018-11-07 ENCOUNTER — Ambulatory Visit: Payer: Managed Care, Other (non HMO) | Attending: Internal Medicine | Admitting: Physical Therapy

## 2018-11-07 ENCOUNTER — Encounter: Payer: Self-pay | Admitting: Occupational Therapy

## 2018-11-07 ENCOUNTER — Other Ambulatory Visit: Payer: Self-pay

## 2018-11-07 DIAGNOSIS — R2681 Unsteadiness on feet: Secondary | ICD-10-CM

## 2018-11-07 DIAGNOSIS — R41840 Attention and concentration deficit: Secondary | ICD-10-CM | POA: Diagnosis present

## 2018-11-07 DIAGNOSIS — R278 Other lack of coordination: Secondary | ICD-10-CM

## 2018-11-07 DIAGNOSIS — M6281 Muscle weakness (generalized): Secondary | ICD-10-CM | POA: Diagnosis present

## 2018-11-07 DIAGNOSIS — R208 Other disturbances of skin sensation: Secondary | ICD-10-CM

## 2018-11-07 DIAGNOSIS — I69351 Hemiplegia and hemiparesis following cerebral infarction affecting right dominant side: Secondary | ICD-10-CM

## 2018-11-07 DIAGNOSIS — R2689 Other abnormalities of gait and mobility: Secondary | ICD-10-CM | POA: Insufficient documentation

## 2018-11-07 DIAGNOSIS — M25511 Pain in right shoulder: Secondary | ICD-10-CM

## 2018-11-07 NOTE — Therapy (Signed)
[facility] [address] [place], [place], [address] Phone: [phone]   Fax:  [phone]  Occupational Therapy Evaluation  Patient Details  Name: Tami Jones MRN: [id number] Date of Birth: [birth year] Referring Provider (OT): Viviana Simpler   Encounter Date: 11/07/2018  OT End of Session - 11/07/18 1628    Visit Number  1    Number of Visits  17    Date for OT Re-Evaluation  01/13/19    Authorization Type  [employer] - 59 VISIT LIMIT OT - 8 used    Authorization - Visit Number  1    Authorization - Number of Visits  37    OT Start Time  2119    OT Stop Time  1620    OT Time Calculation (min)  50 min    Behavior During Therapy  WFL for tasks assessed/performed       Past Medical History:  Diagnosis Date  . Anxiety   . Asthma   . CVA (cerebral vascular accident) ([place]) 03/2006   right occipital, Dr. Doy Mince  . Diabetes mellitus   . GERD (gastroesophageal reflux disease)   . Hyperlipidemia   . Hypertension     Past Surgical History:  Procedure Laterality Date  . CARDIOVASCULAR STRESS TEST  1/15   myoview. EF 60%  . CESAREAN SECTION    . ESOPHAGOGASTRODUODENOSCOPY  05/2005  . TONSILLECTOMY AND ADENOIDECTOMY      There were no vitals filed for this visit.  Subjective Assessment - 11/07/18 1537    Subjective   To get stronger, I want to be able to be very independent, I want to go back and live a normal life    Pertinent History  HTN, Hyperlipidemia, DM, anxiety, CVA - 15 years ago with peripheral visual deficit, concussion - 3 years ago    Currently in Pain?  Yes    Pain Score  7     Pain Location  Shoulder    Pain Orientation  Right    Pain Descriptors / Indicators  Burning;Throbbing    Pain Type  Neuropathic pain    Pain Radiating Towards  down to hand    Pain Onset  More than a month ago    Pain Frequency  Constant    Aggravating Factors   activity    Pain Relieving Factors  Nothing helps         [facility] OT Assessment - 11/07/18 1541      Assessment   Medical Diagnosis  Lt CVA    Referring Provider (OT)  Viviana Simpler    Onset Date/Surgical Date  08/03/18    Hand Dominance  Right    Prior Therapy  had home health PT and OT from early July until 3 wks ago (early Sept.)       Precautions   Precautions  Fall      Restrictions   Weight Bearing Restrictions  No      Balance Screen   Has the patient fallen in the past 6 months  No      Prior Function   Level of Independence  Independent with basic ADLs;Independent with household mobility without device;Independent with community mobility without device    Vocation  Full time employment    Loss adjuster, chartered    Leisure  going to [employer], go out with best friend      ADL   Eating/Feeding  Minimal assistance    Grooming  Modified independent   some attempt to use RUE   Upper Body Bathing  Minimal assistance    Lower Body Bathing  Moderate assistance    Upper Body Dressing  Minimal assistance    Lower Body Dressing  Moderate assistance    Toilet Transfer  Modified independent    Toileting - Clothing Manipulation  Modified independent;Use of adaptive device    Toileting -  Hygiene  Increase time;Modified Independent    Publishing copyTub/Shower Transfer Equipment  Shower seat with back;Grab bars   Handheld shower     IADL   Shopping  Needs to be accompanied on any shopping trip    Light Housekeeping  Performs light daily tasks such as dishwashing, bed making    Meal Prep  Able to complete simple cold meal and snack prep;Able to complete simple warm meal prep   breakfast   Community Mobility  Relies on family or friends for transportation    Medication Management  Is responsible for taking medication in correct dosages at correct time    Physicist, medicalinancial Management  Manages financial matters independently (budgets, writes checks, pays rent, bills goes to bank), collects and keeps track of income      Written  Expression   Dominant Hand  Right    Handwriting  90% legible      Vision - History   Baseline Vision  Wears glasses only for reading    Visual History  Cataracts   Vision problems (peripheral)from prior stroke   Additional Comments  Had prior vision problems - no new visual problems per patient report.      Vision Assessment   Eye Alignment  Within Functional Limits    Ocular Range of Motion  Restricted on looking up    Tracking/Visual Pursuits  Decreased smoothness of horizontal tracking      Activity Tolerance   Activity Tolerance Comments  Patient reports fatigue is problematic      Cognition   Overall Cognitive Status  Impaired/Different from baseline    Area of Impairment  Attention    Current Attention Level  Selective    Attention Comments  Slower processing speed      Posture/Postural Control   Posture/Postural Control  Postural limitations    Posture Comments  Sits in posterior tilt with trunk flexion      Sensation   Light Touch  Impaired by gross assessment    Stereognosis  Impaired by gross assessment    Proprioception  Impaired by gross assessment      Coordination   Gross Motor Movements are Fluid and Coordinated  No    Fine Motor Movements are Fluid and Coordinated  No    Finger Nose Finger Test  overshoots with right hand, misses nose    9 Hole Peg Test  Right;Left    Right 9 Hole Peg Test  1:10:07    Left 9 Hole Peg Test  23.69      Tone   Assessment Location  Right Upper Extremity      ROM / Strength   AROM / PROM / Strength  AROM      AROM   Overall AROM   Deficits;Due to pain    Overall AROM Comments  painful shoulder    AROM Assessment Site  Shoulder    Right/Left Shoulder  Right    Right Shoulder Flexion  78 Degrees    Right Shoulder ABduction  65 Degrees      Hand Function   Right Hand Gross  Grasp  Impaired    Right Hand Grip (lbs)  9.2    Right Hand Lateral Pinch  8 lbs    Left Hand Gross Grasp  Functional    Left Hand Grip (lbs)   53.7    Left Hand Lateral Pinch  16 lbs                      OT Education - 11/07/18 1628    Education Details  Summary of eval results, and potential goals, OT Plan of care    Person(s) Educated  Patient    Methods  Explanation    Comprehension  Verbalized understanding       OT Short Term Goals - 11/07/18 1638      OT SHORT TERM GOAL #1   Title  Patient will complete a home activities program designed on improving functional use of RUE    Time  4    Period  Weeks    Status  New    Target Date  12/14/18      OT SHORT TERM GOAL #2   Title  Patient will demonstrate sufficient balance to stand at counter to utilize BUE functionally for washing/drying dishes without assistance    Time  4    Period  Weeks    Status  New      OT SHORT TERM GOAL #3   Title  Patient will report pain no greater than 4/10 with RUE gentle mobilization  techniques either land or water based    Time  4    Period  Weeks    Status  New      OT SHORT TERM GOAL #4   Title  Patient will show sufficient right hand control and strength to pick up a full glass or mug of liquid and drink while seated.    Time  4    Period  Weeks    Status  New      OT SHORT TERM GOAL #5   Title  Patient will show improved hand/wrist/forearm coordiantion to legibly write a full paragraph (3 sentences) using right hand.    Time  4    Period  Weeks    Status  New        OT Long Term Goals - 11/07/18 1642      OT LONG TERM GOAL #1   Title  Patient will independently complete an HEP designed to improve/restore RUE range of motion without report of pain    Time  8    Period  Weeks    Status  New    Target Date  01/13/19      OT LONG TERM GOAL #2   Title  Patient will demonstrate sufficient RUE functional grasp and movement as well as balance to replace clean linens on bed, e.g. fitted sheet    Time  8    Period  Weeks    Status  New      OT LONG TERM GOAL #3   Title  Patient will demonstrate  sufficient motor control and balance to carry a glass or mug of liquid across 15 foot floor space without spilling    Time  8    Period  Weeks    Status  New      OT LONG TERM GOAL #4   Title  Patient will demonstrate sufficient motor control and strength to utilize bilateral method to cut food as part of meal prep.  Time  8    Period  Weeks    Status  New      OT LONG TERM GOAL #5   Title  Patient will demonstrate understanding of recommendations relating to returning to driving.    Time  8    Period  Weeks    Status  New            Plan - 11/07/18 1630    Clinical Impression Statement  Patient is a 63 year old woman with history of stroke with remaining peripheral visual deficits, now with acute lacunar infarct - Left centrum semiovale with Right sided hemiplegia and pain in right shoulder.  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  Feel patient is an excellent aquatic therapy candidate if this is a covered service to address her functional limitations of balance and RUE movement.  Patient will benefit from skilled OT intervention to help her return to greater level of independence with ADL/IADL.    OT Occupational Profile and History  Detailed Assessment- Review of Records and additional review of physical, cognitive, psychosocial history related to current functional performance    Occupational performance deficits (Please refer to evaluation for details):  ADL's;IADL's;Leisure;Rest and Sleep;Work    Games developer / Function / Physical Skills  ADL;Coordination;Endurance;GMC;Muscle spasms;UE functional use;Balance;Decreased knowledge of precautions;Sensation;Body mechanics;Decreased knowledge of use of  DME;Flexibility;IADL;Pain;Vision;Cardiopulmonary status limiting activity;Dexterity;FMC;Proprioception;Strength;Tone;ROM;Mobility;Edema    Cognitive Skills  Attention;Thought;Emotional;Energy/Drive    Psychosocial Skills  Coping Strategies;Habits;Interpersonal Interaction;Routines and Behaviors    Rehab Potential  Good    Clinical Decision Making  Several treatment options, min-mod task modification necessary    Comorbidities Affecting Occupational Performance:  Presence of comorbidities impacting occupational performance    Comorbidities impacting occupational performance description:  HTN, DM, Hyperlipidemia, Anxiety, (self reported depression), 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    Modification or Assistance to Complete Evaluation   Min-Moderate modification of tasks or assist with assess necessary to complete eval    OT Frequency  2x / week    OT Duration  8 weeks    OT Treatment/Interventions  Self-care/ADL training;Aquatic Therapy;Cryotherapy;Electrical Stimulation;Moist Heat;Ultrasound;Contrast Bath;Fluidtherapy;Paraffin;Therapeutic exercise;Neuromuscular education;Energy conservation;DME and/or AE instruction;Manual Therapy;Functional Mobility Training;Cognitive remediation/compensation;Therapeutic activities;Splinting;Visual/perceptual remediation/compensation;Patient/family education;Balance training;Psychosocial skills training;Coping strategies training    Plan  Need mobilization gentle to R scapula, and shoulder - need to start HEP    Recommended Other Services  aquatic therapy    Consulted and Agree with Plan of Care  Patient       Patient will benefit from skilled therapeutic intervention in order to improve the following deficits and impairments:   Body Structure / Function / Physical Skills: ADL, Coordination, Endurance, GMC, Muscle spasms, UE functional use, Balance, Decreased knowledge of precautions, Sensation, Body mechanics, Decreased knowledge of use  of DME, Flexibility, IADL, Pain, Vision, Cardiopulmonary status limiting activity, Dexterity, FMC, Proprioception, Strength, Tone, ROM, Mobility, Edema Cognitive Skills: Attention, Thought, Emotional, Energy/Drive Psychosocial Skills: Coping Strategies, Habits, Interpersonal Interaction, Routines and Behaviors   Visit Diagnosis: Hemiplegia and hemiparesis following cerebral infarction affecting right dominant side (HCC) - Plan: Ot plan of care cert/re-cert  Acute pain of right shoulder - Plan: Ot plan of care cert/re-cert  Muscle weakness (generalized) - Plan: Ot  plan of care cert/re-cert  Other lack of coordination - Plan: Ot plan of care cert/re-cert  Other disturbances of skin sensation - Plan: Ot plan of care cert/re-cert  Attention and concentration deficit - Plan: Ot plan of care cert/re-cert  Unsteadiness on feet - Plan: Ot plan of care cert/re-cert    Problem List Patient Active Problem List   Diagnosis Date Noted  . Neuropathic pain, arm 10/26/2018  . MDD (major depressive disorder), single episode, moderate (HCC) 09/21/2018  . Hemiparesis of right dominant side (HCC) 08/17/2018  . Hematuria 08/17/2018  . Pleuritic chest pain 12/23/2017  . SOB (shortness of breath) 03/08/2017  . Vestibular dizziness 01/21/2016  . Tick bite 07/15/2015  . Cerebrovascular disease 07/17/2014  . Preventative health care 06/25/2014  . UTI (urinary tract infection) 06/25/2014  . Right sided sciatica 03/08/2013  . MENOPAUSAL SYNDROME 09/10/2008  . Mood disorder (HCC) 09/15/2007  . Essential hypertension, benign 08/10/2006  . Type 2 diabetes mellitus with other circulatory complications (HCC) 06/17/2006  . HYPERCHOLESTEROLEMIA 06/17/2006  . GERD 06/17/2006    Collier Salina, OTR/L 11/07/2018, 4:52 PM  [facility] [address] [place], [place], [address] Phone: [phone]   Fax:  [phone]  Name: KARMELLA BOUVIER MRN: [id number] Date of Birth: Jan 26, 1956

## 2018-11-08 NOTE — Therapy (Signed)
[facility] [address] [place], [place], [address] Phone: [phone]   Fax:  [phone]  Physical Therapy Evaluation  Patient Details  Name: Tami Jones MRN: [id number] Date of Birth: 1955/10/29 Referring Provider (PT): Tillman Abide, MD   Encounter Date: 11/07/2018  PT End of Session - 11/08/18 1102    Visit Number  1    Number of Visits  17    Date for PT Re-Evaluation  01/07/19    Authorization Type  [employer]ign[employer]    Authorization - Visit Number  11   10 visits previously used   Authorization - Number of Visits  60    PT Start Time  1445    PT Stop Time  1530    PT Time Calculation (min)  45 min    Equipment Utilized During Treatment  Gait belt    Activity Tolerance  Patient limited by fatigue       Past Medical History:  Diagnosis Date  . Anxiety   . Asthma   . 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 Thad Ranger  . Diabetes mellitus   . GERD (gastroesophageal reflux disease)   . Hyperlipidemia   . Hypertension     Past Surgical History:  Procedure Laterality Date  . CARDIOVASCULAR STRESS TEST  1/15   myoview. EF 60%  . CESAREAN SECTION    . ESOPHAGOGASTRODUODENOSCOPY  05/2005  . TONSILLECTOMY AND ADENOIDECTOMY      There were no vitals filed for this visit.   Subjective Assessment - 11/08/18 1033    Subjective  Pt reports symptoms of CVA started while she was at work on 08-03-18 - daughter transported her to ED at [facility]; pt was admitted to [facility] with acute lunar infarct, with Rt hemiparesis.  Pt was discharged home on 08-05-18 with orders for home health PT & OT.  Pt states she received HH therapies until approx. 3 weeks ago (til early Sept.). Pt presents to PT amb. with RW.    Pertinent History  anxiety, type 2 DM, h/o major depressive disorder, Rt sided sciatica, cerebrovascular disease, SOB    Diagnostic tests  MRI, MRA - MRI revealed acute  lunar infarct    Patient Stated Goals  Be able to walk with cane; be able to go shopping, increase endurance    Pain Onset  More than a month ago         [facility] PT Assessment - 11/08/18 0001      Assessment   Medical Diagnosis  Lacunar infarct with Rt hemiparesis    Referring Provider (PT)  Tillman Abide, MD    Onset Date/Surgical Date  08/03/18    Hand Dominance  Right    Prior Therapy  had home health PT and OT from early July until 3 wks ago (early Sept.)       Precautions   Precautions  Fall      Restrictions   Weight Bearing Restrictions  No      Balance Screen   Has the patient fallen in the past 6 months  No    Has the patient had a decrease in activity level because of a fear of falling?   Yes    Is the patient reluctant to leave their home because of a fear of falling?   Yes      Home Environment   Living Environment  Private residence    Type of Home  House  Home Access  Stairs to enter    Entrance Stairs-Number of Steps  3   in back   Entrance Stairs-Rails  Left    Home Layout  One level    Home Equipment  Tub bench;Shower seat;Hand held shower head;Grab bars - toilet;Grab bars - tub/shower      Prior Function   Level of Independence  Independent with basic ADLs;Independent with household mobility without device;Independent with community mobility without device    Vocation  Full time employment    Advice worker    Leisure  going to [employer], go out with best friend      ROM / Strength   AROM / PROM / Strength  Strength      AROM   Overall AROM   Within functional limits for tasks performed   bil. LE's     Strength   Overall Strength  Deficits    Overall Strength Comments  LLE WFL's for strength    Strength Assessment Site  Hip;Knee;Ankle    Right/Left Hip  Right    Right Hip Flexion  3+/5    Right Hip Extension  3-/5    Right Hip ABduction  3-/5    Right/Left Knee  Right    Right Knee Flexion  3/5    Right Knee  Extension  4-/5    Right/Left Ankle  Right    Right Ankle Dorsiflexion  3+/5    Right Ankle Plantar Flexion  2/5      Transfers   Transfers  Sit to Stand;Stand to Sit    Sit to Stand  5: Supervision   UE support needed   Stand to Sit  5: Supervision    Stand to Sit Details  has occasional uncontrolled descent     Number of Reps  Other reps (comment)   3     Ambulation/Gait   Ambulation/Gait  Yes    Ambulation/Gait Assistance  4: Min guard    Ambulation Distance (Feet)  100 Feet    Assistive device  Rolling walker    Gait Pattern  Decreased hip/knee flexion - right;Decreased step length - right;Lateral trunk lean to left   decreased Rt heel strike at stance    Ambulation Surface  Level;Indoor    Gait velocity  16.66 secs with RW = 1.97 secs with RW       Standardized Balance Assessment   Standardized Balance Assessment  Berg Balance Test;Timed Up and Go Test      Berg Balance Test   Sit to Stand  Able to stand  independently using hands    Standing Unsupported  Unable to stand 30 seconds unassisted   pt stood 2.63 secs 1st trial, 8.78 secs 2nd trial   Sitting with Back Unsupported but Feet Supported on Floor or Stool  Able to sit safely and securely 2 minutes    Stand to Sit  Controls descent by using hands    Transfers  Able to transfer safely, definite need of hands    Standing Unsupported with Eyes Closed  Needs help to keep from falling    Standing Unsupported with Feet Together  Needs help to attain position and unable to hold for 15 seconds    From Standing, Reach Forward with Outstretched Arm  Loses balance while trying/requires external support    From Standing Position, Pick up Object from Floor  Unable to try/needs assist to keep balance    From Standing Position, Turn to  Look Behind Over each Shoulder  Needs assist to keep from losing balance and falling    Turn 360 Degrees  Needs assistance while turning    Standing Unsupported, Alternately Place Feet on Step/Stool   Needs assistance to keep from falling or unable to try    Standing Unsupported, One Foot in [employer]orporatedFront  Loses balance while stepping or standing    Standing on One Leg  Unable to try or needs assist to prevent fall    Total Score  13      Timed Up and Go Test   TUG  Normal TUG    Normal TUG (seconds)  26.13   with RW               Objective measurements completed on examination: See above findings.              PT Education - 11/08/18 1100    Education Details  instructed pt to begin practicing standing unsupported at kitchen sink with chair behind her for safety and attempt to stand as long as able without holding - pt currently able to stand 8.78 secs without UE support    Person(s) Educated  Patient    Methods  Explanation;Demonstration    Comprehension  Verbalized understanding;Returned demonstration       PT Short Term Goals - 11/08/18 1111      PT SHORT TERM GOAL #1   Title  Pt will stand 2" unsupported at sink with SBA without LOB for incr. independence and safety with ADL's in standing.    Baseline  8.78 secs without UE support - 11-07-18    Time  4    Period  Weeks    Status  New    Target Date  12/07/18      PT SHORT TERM GOAL #2   Title  Improve Berg score from 13/56 to >/= 23/56 to demo improvement in standing balance.    Baseline  13/56 on 11-07-18    Time  4    Period  Weeks    Status  New    Target Date  12/07/18      PT SHORT TERM GOAL #3   Title  Improve TUG score from 26.13 secs with RW to </= 21 secs with RW for improved functional mobility and reduced fall risk.    Baseline  26.13 secs with RW -11-07-18    Time  4    Period  Weeks    Status  New    Target Date  12/07/18      PT SHORT TERM GOAL #4   Title  Incr. gait velocity from 1.97 ft/sec with RW to >/= 2.3 ft/sec with RW for incr. gait efficiency.    Baseline  16.66 secs with RW = 1.97 ft/sec with RW    Time  4    Period  Weeks    Status  New    Target Date  12/07/18      PT  SHORT TERM GOAL #5   Title  Independent in HEP for  balance & strengthening exercises.    Time  4    Period  Weeks    Status  New    Target Date  12/07/18        PT Long Term Goals - 11/08/18 1116      PT LONG TERM GOAL #1   Title  Pt will improve TUG score to >/= 16 secs with RW for reduced fall risk.  Baseline  26.13 secs with RW - 11-07-18    Time  8    Period  Weeks    Status  New    Target Date  01/07/19      PT LONG TERM GOAL #2   Title  Pt will amb. 300' without device with SBA for increased community accessibility.    Baseline  pt using RW -100' with SBA    Time  8    Period  Weeks    Status  New    Target Date  01/07/19      PT LONG TERM GOAL #3   Title  Modified independent household amb. (50') without device.    Time  8    Period  Weeks    Status  New    Target Date  01/07/19      PT LONG TERM GOAL #4   Title  Improve Berg score from 13/56 to >/ 36/56 to reduce fall risk.    Baseline  13/56 on 11-07-18    Time  8    Period  Weeks    Status  New    Target Date  01/07/19      PT LONG TERM GOAL #5   Title  Increase gait speed from 1.97 ft/sec with RW to >/= 2.7 ft/sec with RW for incr. gait efficiency.    Baseline  16.66 secs = 1.97 ft/sec with RW on 11-07-18    Time  8    Period  Weeks    Status  New             Plan - 11/08/18 1104    Clinical Impression Statement  Pt is a 63 yr old lady s/p acute lunar infarct on 08-03-18 who presents with Rt hemiparesis, c/o RUE pain, decreased balance and gait.  Pt was hospitalized 08-03-18 - 08-05-18 at [facility], had [facility] PT and OT from early July - early Sept.  Pt is at high fall risk per Sharlene Motts score of 13/56 ( pt is currently unable to stand unsupported) and TUG score 26.13 secs with RW also indicative of fall risk.  Pt will benefit from skilled PT to address RLE weakness, gait and balance impairments.    Personal Factors and Comorbidities  Fitness;Comorbidity 2;Transportation;Profession;Behavior Pattern     Comorbidities  Type 2 DM, Rt sided sciatica, h/o major depressive disorder, anxiety, cerebrovascular disease    Examination-Activity Limitations  Bathing;Locomotion Level;Transfers;Reach Overhead;Stairs;Stand;Lift;Squat    Examination-Participation Restrictions  Meal Prep;Cleaning;Community Activity;Driving;Interpersonal Relationship;Laundry;Shop    Stability/Clinical Decision Making  Evolving/Moderate complexity    Clinical Decision Making  Moderate    Rehab Potential  Good    PT Frequency  2x / week    PT Duration  8 weeks    PT Treatment/Interventions  ADLs/Self Care Home Management;Aquatic Therapy;Gait training;Stair training;Neuromuscular re-education;DME Instruction;Balance training;Therapeutic exercise;Therapeutic activities;Patient/family education    PT Next Visit Plan  HEP for balance and RLE strengthening - include sit to stand, standing unsupported (initiated this ex. on 11-07-18 at eval)    PT Home Exercise Plan  see above    Recommended Other Services  pt is receiving OT - OT to add aquatic therapy to tx plan    Consulted and Agree with Plan of Care  Patient       Patient will benefit from skilled therapeutic intervention in order to improve the following deficits and impairments:  Abnormal gait, Decreased activity tolerance, Decreased endurance, Decreased coordination, Decreased balance, Decreased strength, Impaired sensation, Impaired UE functional use,  Pain  Visit Diagnosis: Hemiplegia and hemiparesis following cerebral infarction affecting right dominant side (HCC) - Plan: PT plan of care cert/re-cert  Other abnormalities of gait and mobility - Plan: PT plan of care cert/re-cert  Muscle weakness (generalized) - Plan: PT plan of care cert/re-cert  Unsteadiness on feet - Plan: PT plan of care cert/re-cert     Problem List Patient Active Problem List   Diagnosis Date Noted  . Neuropathic pain, arm 10/26/2018  . MDD (major depressive disorder), single episode, moderate  ([place]) 09/21/2018  . Hemiparesis of right dominant side ([place]) 08/17/2018  . Hematuria 08/17/2018  . Pleuritic chest pain 12/23/2017  . SOB (shortness of breath) 03/08/2017  . Vestibular dizziness 01/21/2016  . Tick bite 07/15/2015  . Cerebrovascular disease 07/17/2014  . Preventative health care 06/25/2014  . UTI (urinary tract infection) 06/25/2014  . Right sided sciatica 03/08/2013  . MENOPAUSAL SYNDROME 09/10/2008  . Mood disorder ([place]) 09/15/2007  . Essential hypertension, benign 08/10/2006  . Type 2 diabetes mellitus with other circulatory complications ([place]) 54/62/7035  . HYPERCHOLESTEROLEMIA 06/17/2006  . GERD 06/17/2006    Alda Lea, PT 11/08/2018, 11:25 AM  [facility] [address] [place], [place], [address] Phone: [phone]   Fax:  [phone]  Name: BETHANEY OSHANA MRN: [id number] Date of Birth: 1955-05-14

## 2018-11-14 ENCOUNTER — Ambulatory Visit: Payer: Managed Care, Other (non HMO) | Attending: Internal Medicine | Admitting: Physical Therapy

## 2018-11-14 ENCOUNTER — Encounter: Payer: Self-pay | Admitting: Physical Therapy

## 2018-11-14 ENCOUNTER — Encounter: Payer: Self-pay | Admitting: Occupational Therapy

## 2018-11-14 ENCOUNTER — Ambulatory Visit: Payer: Managed Care, Other (non HMO) | Admitting: Occupational Therapy

## 2018-11-14 ENCOUNTER — Other Ambulatory Visit: Payer: Self-pay

## 2018-11-14 DIAGNOSIS — R278 Other lack of coordination: Secondary | ICD-10-CM | POA: Diagnosis present

## 2018-11-14 DIAGNOSIS — M6281 Muscle weakness (generalized): Secondary | ICD-10-CM | POA: Diagnosis present

## 2018-11-14 DIAGNOSIS — I69351 Hemiplegia and hemiparesis following cerebral infarction affecting right dominant side: Secondary | ICD-10-CM | POA: Diagnosis present

## 2018-11-14 DIAGNOSIS — R208 Other disturbances of skin sensation: Secondary | ICD-10-CM | POA: Insufficient documentation

## 2018-11-14 DIAGNOSIS — R41840 Attention and concentration deficit: Secondary | ICD-10-CM | POA: Diagnosis present

## 2018-11-14 DIAGNOSIS — M25511 Pain in right shoulder: Secondary | ICD-10-CM | POA: Diagnosis present

## 2018-11-14 DIAGNOSIS — R2681 Unsteadiness on feet: Secondary | ICD-10-CM | POA: Insufficient documentation

## 2018-11-14 DIAGNOSIS — R2689 Other abnormalities of gait and mobility: Secondary | ICD-10-CM | POA: Diagnosis present

## 2018-11-14 NOTE — Therapy (Signed)
[place] [facility] MAIN [facility] SERVICES [address] [place], [place], [address] Phone: [phone]   Fax:  [phone]  Occupational Therapy Treatment  Patient Details  Name: Tami Jones MRN: [id number] Date of Birth: 05/04/1955 Referring Provider (OT): Tillman Abide   Encounter Date: 11/14/2018  OT End of Session - 11/14/18 1701    Visit Number  2    Number of Visits  17    Date for OT Re-Evaluation  01/13/19    Authorization Type  [employer] - 60 VISIT LIMIT OT - 8 used    Authorization Time Period  Progress report period starting 11/14/2018    OT Start Time  1525    OT Stop Time  1600    OT Time Calculation (min)  35 min    Behavior During Therapy  [facility] for tasks assessed/performed       Past Medical History:  Diagnosis Date  . Anxiety   . Asthma   . 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 Thad Ranger  . Diabetes mellitus   . GERD (gastroesophageal reflux disease)   . Hyperlipidemia   . Hypertension     Past Surgical History:  Procedure Laterality Date  . CARDIOVASCULAR STRESS TEST  1/15   myoview. EF 60%  . CESAREAN SECTION    . ESOPHAGOGASTRODUODENOSCOPY  05/2005  . TONSILLECTOMY AND ADENOIDECTOMY      There were no vitals filed for this visit.  Subjective Assessment - 11/14/18 1658    Subjective   Pt. reports having right shoulder pain.    Pertinent History  HTN, Hyperlipidemia, DM, anxiety, CVA - 15 years ago with peripheral visual deficit, concussion - 3 years ago    Currently in Pain?  Yes    Pain Score  7     Pain Location  Shoulder    Pain Orientation  --   Radiating from the back of his neck down through her right forearm   Pain Descriptors / Indicators  Aching    Pain Type  Neuropathic pain    Pain Onset  More than a month ago    Pain Frequency  Constant      OT TREATMENT    Therapeutic Exercise:   Pt. Worked on pink  thearputty ex. For right hand strengthening. Exercises included:  gross gripping, gross digit extension, thumb abduction, lateral, and 3pt. Pinch strengthening, digit abduction, and thumb opposition. Pt. Was provided with a visual handout HEP through [employer].   Response to Treatment  Pt. reports 7/10 pain radiating from the back of her neck down through her right shoulder. Pt. reports that she tries to engage her RUE during ADLs, and IADL tasks, and is using red theraband resistive shoulder exercises at home. Pt. education was provided about holding resistive band exercises at this time secondary to ongoing LUE pain. Pt. is responding well to moist heat. However, pain his limiting pt.'s function. Pt. was upgraded to pink theraputty, and tolerated the exercises well requiring verbal cues, and  visual demonstration for each. Pt. continues to work on improving pain, improving tone, and sensory awareness during ADLs, and IADL tasks.                         OT Education - 11/14/18 1701    Education Details  RU, and hand function    Person(s) Educated  Patient    Methods  Explanation    Comprehension  Verbalized understanding  OT Short Term Goals - 11/07/18 1638      OT SHORT TERM GOAL #1   Title  Patient will complete a home activities program designed on improving functional use of RUE    Time  4    Period  Weeks    Status  New    Target Date  12/14/18      OT SHORT TERM GOAL #2   Title  Patient will demonstrate sufficient balance to stand at counter to utilize BUE functionally for washing/drying dishes without assistance    Time  4    Period  Weeks    Status  New      OT SHORT TERM GOAL #3   Title  Patient will report pain no greater than 4/10 with RUE gentle mobilization  techniques either land or water based    Time  4    Period  Weeks    Status  New      OT SHORT TERM GOAL #4   Title  Patient will show sufficient right hand control and strength to pick up a full glass or mug of liquid and drink while seated.     Time  4    Period  Weeks    Status  New      OT SHORT TERM GOAL #5   Title  Patient will show improved hand/wrist/forearm coordiantion to legibly write a full paragraph (3 sentences) using right hand.    Time  4    Period  Weeks    Status  New        OT Long Term Goals - 11/07/18 1642      OT LONG TERM GOAL #1   Title  Patient will independently complete an HEP designed to improve/restore RUE range of motion without report of pain    Time  8    Period  Weeks    Status  New    Target Date  01/13/19      OT LONG TERM GOAL #2   Title  Patient will demonstrate sufficient RUE functional grasp and movement as well as balance to replace clean linens on bed, e.g. fitted sheet    Time  8    Period  Weeks    Status  New      OT LONG TERM GOAL #3   Title  Patient will demonstrate sufficient motor control and balance to carry a glass or mug of liquid across 15 foot floor space without spilling    Time  8    Period  Weeks    Status  New      OT LONG TERM GOAL #4   Title  Patient will demonstrate sufficient motor control and strength to utilize bilateral method to cut food as part of meal prep.    Time  8    Period  Weeks    Status  New      OT LONG TERM GOAL #5   Title  Patient will demonstrate understanding of recommendations relating to returning to driving.    Time  8    Period  Weeks    Status  New            Plan - 11/14/18 1703    Clinical Impression Statement  Pt. reports 7/10 pain radiating from the back of her neck down through her right shoulder. Pt. reports that she tries to engage her RUE during ADLs, and IADL tasks, and is using red theraband resistive shoulder exercises  at home. Pt. education was provided about holding resistive band exercises at this time secondary to ongoing LUE pain. Pt. is responding well to moist heat. However, pain his limiting pt.'s function. Pt. was upgraded to pink theraputty, and tolerated the exercises well requiring verbal cues, and   visual demonstration for each. Pt. continues to work on improving pain, improving tone, and sensory awareness during ADLs, and IADL tasks.    OT Occupational Profile and History  Detailed Assessment- Review of Records and additional review of physical, cognitive, psychosocial history related to current functional performance    Occupational performance deficits (Please refer to evaluation for details):  ADL's;IADL's;Leisure;Rest and Sleep;Work    Marketing executive / Function / Physical Skills  ADL;Coordination;Endurance;GMC;Muscle spasms;UE functional use;Balance;Decreased knowledge of precautions;Sensation;Body mechanics;Decreased knowledge of use of DME;Flexibility;IADL;Pain;Vision;Cardiopulmonary status limiting activity;Dexterity;FMC;Proprioception;Strength;Tone;ROM;Mobility;Edema    Cognitive Skills  Attention;Thought;Emotional;Energy/Drive    Psychosocial Skills  Coping Strategies;Habits;Interpersonal Interaction;Routines and Behaviors    Rehab Potential  Good    Clinical Decision Making  Several treatment options, min-mod task modification necessary    Comorbidities Affecting Occupational Performance:  Presence of comorbidities impacting occupational performance    Comorbidities impacting occupational performance description:  HTN, DM, Hyperlipidemia, Anxiety, (self reported depression), 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    Modification or Assistance to Complete Evaluation   Min-Moderate modification of tasks or assist with assess necessary to complete eval    OT Frequency  2x / week    OT Duration  8 weeks    OT Treatment/Interventions  Self-care/ADL training;Aquatic Therapy;Cryotherapy;Electrical Stimulation;Moist Heat;Ultrasound;Contrast Bath;Fluidtherapy;Paraffin;Therapeutic exercise;Neuromuscular education;Energy conservation;DME and/or AE instruction;Manual Therapy;Functional Mobility Training;Cognitive remediation/compensation;Therapeutic  activities;Splinting;Visual/perceptual remediation/compensation;Patient/family education;Balance training;Psychosocial skills training;Coping strategies training    Consulted and Agree with Plan of Care  Patient       Patient will benefit from skilled therapeutic intervention in order to improve the following deficits and impairments:   Body Structure / Function / Physical Skills: ADL, Coordination, Endurance, GMC, Muscle spasms, UE functional use, Balance, Decreased knowledge of precautions, Sensation, Body mechanics, Decreased knowledge of use of DME, Flexibility, IADL, Pain, Vision, Cardiopulmonary status limiting activity, Dexterity, FMC, Proprioception, Strength, Tone, ROM, Mobility, Edema Cognitive Skills: Attention, Thought, Emotional, Energy/Drive Psychosocial Skills: Coping Strategies, Habits, Interpersonal Interaction, Routines and Behaviors   Visit Diagnosis: Muscle weakness (generalized)  Other lack of coordination    Problem List Patient Active Problem List   Diagnosis Date Noted  . Neuropathic pain, arm 10/26/2018  . MDD (major depressive disorder), single episode, moderate ([place]) 09/21/2018  . Hemiparesis of right dominant side ([place]) 08/17/2018  . Hematuria 08/17/2018  . Pleuritic chest pain 12/23/2017  . SOB (shortness of breath) 03/08/2017  . Vestibular dizziness 01/21/2016  . Tick bite 07/15/2015  . Cerebrovascular disease 07/17/2014  . Preventative health care 06/25/2014  . UTI (urinary tract infection) 06/25/2014  . Right sided sciatica 03/08/2013  . MENOPAUSAL SYNDROME 09/10/2008  . Mood disorder ([place]) 09/15/2007  . Essential hypertension, benign 08/10/2006  . Type 2 diabetes mellitus with other circulatory complications ([place]) 74/16/3845  . HYPERCHOLESTEROLEMIA 06/17/2006  . GERD 06/17/2006    Harrel Carina, MS, OTR/L 11/14/2018, 5:15 PM  [place] MAIN [facility] SERVICES [address] [place], [place],  [address] Phone: [phone]   Fax:  [phone]  Name: Tami Jones MRN: [id number] Date of Birth: Oct 09, 1955

## 2018-11-14 NOTE — Therapy (Addendum)
[facility] [address] [place], [place], [address] Phone: [phone]   Fax:  [phone]  Physical Therapy Treatment  Patient Details  Name: Tami Jones MRN: [id number] Date of Birth: 05/13/1955 Referring Provider (PT): Tillman Abideichard Letvak, MD   Encounter Date: 11/14/2018  PT End of Session - 11/14/18 1424    Visit Number  2    Number of Visits  17    Date for PT Re-Evaluation  01/07/19    Authorization Type  [employer]    Authorization - Visit Number  11   10 visits previously used   Authorization - Number of Visits  60    PT Start Time  1435    PT Stop Time  1520    PT Time Calculation (min)  45 min    Equipment Utilized During Treatment  Gait belt    Activity Tolerance  Patient limited by fatigue       Past Medical History:  Diagnosis Date  . Anxiety   . Asthma   . 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 Thad Rangereynolds  . Diabetes mellitus   . GERD (gastroesophageal reflux disease)   . Hyperlipidemia   . Hypertension     Past Surgical History:  Procedure Laterality Date  . CARDIOVASCULAR STRESS TEST  1/15   myoview. EF 60%  . CESAREAN SECTION    . ESOPHAGOGASTRODUODENOSCOPY  05/2005  . TONSILLECTOMY AND ADENOIDECTOMY      There were no vitals filed for this visit.  Subjective Assessment - 11/14/18 1426    Subjective  Patient claims that she is not doing very well today. Reports of pain in right arm and leg is bad today. Also referred to "pop" in left leg 3 weeks ago which has consistently been hurting each day.    Pertinent History  anxiety, type 2 DM, h/o major depressive disorder, Rt sided sciatica, cerebrovascular disease, SOB    Diagnostic tests  MRI, MRA - MRI revealed acute lunar infarct    Patient Stated Goals  Be able to walk with cane; be able to go shopping, increase endurance    Currently in Pain?  Yes    Pain Score  7     Pain Location  Leg    Pain Orientation   Right    Pain Descriptors / Indicators  Aching;Sore;Constant    Pain Type  Neuropathic pain    Pain Onset  More than a month ago    Pain Frequency  Constant    Aggravating Factors   activity    Pain Relieving Factors  nothing helps    Multiple Pain Sites  Yes    Pain Score  7    Pain Location  Arm    Pain Orientation  Right    Pain Descriptors / Indicators  Aching;Burning;Sore;Constant    Pain Type  Neuropathic pain    Pain Onset  More than a month ago    Pain Frequency  Constant    Aggravating Factors   activity    Pain Relieving Factors  nothing helps     This entire session was performed under direct supervision and direction of a licensed therapist/therapist assistant . I have personally read, edited and approve of the note as written.  Treatment:  There Ex:  Seated marches x20 BLE; Seated knee extension 1x10 RLE; Seated heel/toe raises 1x10 BLE with 3 sec hold;  Supine knee to chest x5 RLE; AROM to 65-70 degrees  hip flexion, AAROM to 90-100 degrees. Supine SLR x5 RLE; AROM to 15 deg hip flexion, AAROM to 40 degrees. Supine hamstring curl with towel under heel x5 RLE; AAROM to 90 degrees knee flexion. Reports increased tightness along quadriceps during eccentric phase.  Supine hip abduction x5 RLE; AAROM to 20-25 degrees hip abduction.  Stretching:  Knee-to-chest x30 sec hold 2 sets RLE Hamstring SLR stretch x30 sec hold 2 sets RLE; reports of pain increased up to 7/10 NPS along hamstrings and posterior capsule of knee with 45 degrees hip flexion, eased with hip flexion 35 degrees. Gastroc knee extension stretch x30 sec hold 2 sets RLE; patient reports significant tightness along gastroc and posterior knee capsule.\  Response to treatment:  Patient tolerated therapeutic exercises well. Patient is quick to fatigue with RLE AROM exercises, requiring frequent breaks. Patient is independent with bed mobility and appears to have good awareness of how and when to utilize RW  safely during transfers. Requires some active-assist with supine exercises and min VC for technique. Motivation is low at times, improves with encouragement. Has tendency to not report her pain if worsened during treatment; informed patient to express if pain surpasses 5/10 on NPS.      PT Education - 11/14/18 1621    Education Details  HEP, see pt instruction    Person(s) Educated  Patient    Methods  Explanation;Demonstration    Comprehension  Verbalized understanding;Returned demonstration;Need further instruction       PT Short Term Goals - 11/08/18 1111      PT SHORT TERM GOAL #1   Title  Pt will stand 2" unsupported at sink with SBA without LOB for incr. independence and safety with ADL's in standing.    Baseline  8.78 secs without UE support - 11-07-18    Time  4    Period  Weeks    Status  New    Target Date  12/07/18      PT SHORT TERM GOAL #2   Title  Improve Berg score from 13/56 to >/= 23/56 to demo improvement in standing balance.    Baseline  13/56 on 11-07-18    Time  4    Period  Weeks    Status  New    Target Date  12/07/18      PT SHORT TERM GOAL #3   Title  Improve TUG score from 26.13 secs with RW to </= 21 secs with RW for improved functional mobility and reduced fall risk.    Baseline  26.13 secs with RW -11-07-18    Time  4    Period  Weeks    Status  New    Target Date  12/07/18      PT SHORT TERM GOAL #4   Title  Incr. gait velocity from 1.97 ft/sec with RW to >/= 2.3 ft/sec with RW for incr. gait efficiency.    Baseline  16.66 secs with RW = 1.97 ft/sec with RW    Time  4    Period  Weeks    Status  New    Target Date  12/07/18      PT SHORT TERM GOAL #5   Title  Independent in HEP for  balance & strengthening exercises.    Time  4    Period  Weeks    Status  New    Target Date  12/07/18        PT Long Term Goals - 11/08/18 1116  PT LONG TERM GOAL #1   Title  Pt will improve TUG score to >/= 16 secs with RW for reduced fall risk.     Baseline  26.13 secs with RW - 11-07-18    Time  8    Period  Weeks    Status  New    Target Date  01/07/19      PT LONG TERM GOAL #2   Title  Pt will amb. 300' without device with SBA for increased community accessibility.    Baseline  pt using RW -100' with SBA    Time  8    Period  Weeks    Status  New    Target Date  01/07/19      PT LONG TERM GOAL #3   Title  Modified independent household amb. (50') without device.    Time  8    Period  Weeks    Status  New    Target Date  01/07/19      PT LONG TERM GOAL #4   Title  Improve Berg score from 13/56 to >/ 36/56 to reduce fall risk.    Baseline  13/56 on 11-07-18    Time  8    Period  Weeks    Status  New    Target Date  01/07/19      PT LONG TERM GOAL #5   Title  Increase gait speed from 1.97 ft/sec with RW to >/= 2.7 ft/sec with RW for incr. gait efficiency.    Baseline  16.66 secs = 1.97 ft/sec with RW on 11-07-18    Time  8    Period  Weeks    Status  New            Plan - 11/14/18 1623    Clinical Impression Statement  Patient presents to therapy with moderate pain in right arm and leg that does not ease. Patient has difficulty with ambulating >50 feet and standng for prolonged periods of time without rest. Able to transfer from chair to bed and perform bed mobility independently. Patient able to perform active ROM to active-assist ROM in right leg without significant increase in symptoms, does fatigue easy and requires frequent rest breaks. Decreased tolerance with general stretching in right leg due to provoked pain symptoms. Patient would benefit from continued skilled PT to improve strength, balance and gait safety.    Personal Factors and Comorbidities  Fitness;Comorbidity 2;Transportation;Profession;Behavior Pattern    Comorbidities  Type 2 DM, Rt sided sciatica, h/o major depressive disorder, anxiety, cerebrovascular disease    Examination-Activity Limitations  Bathing;Locomotion Level;Transfers;Reach  Overhead;Stairs;Stand;Lift;Squat    Examination-Participation Restrictions  Meal Prep;Cleaning;Community Activity;Driving;Interpersonal Relationship;Laundry;Shop    Stability/Clinical Decision Making  Evolving/Moderate complexity    Rehab Potential  Good    PT Frequency  2x / week    PT Duration  8 weeks    PT Treatment/Interventions  ADLs/Self Care Home Management;Aquatic Therapy;Gait training;Stair training;Neuromuscular re-education;DME Instruction;Balance training;Therapeutic exercise;Therapeutic activities;Patient/family education    PT Next Visit Plan  HEP for balance and RLE strengthening - include sit to stand, standing unsupported (initiated this ex. on 11-07-18 at eval)    PT Home Exercise Plan  Added seated marches, knee extension, and heel/toe raises; see pt instructions    Consulted and Agree with Plan of Care  Patient       Patient will benefit from skilled therapeutic intervention in order to improve the following deficits and impairments:  Abnormal gait, Decreased activity tolerance, Decreased endurance,  Decreased coordination, Decreased balance, Decreased strength, Impaired sensation, Impaired UE functional use, Pain  Visit Diagnosis: Muscle weakness (generalized)  Other lack of coordination  Unsteadiness on feet  Other abnormalities of gait and mobility  Hemiplegia and hemiparesis following cerebral infarction affecting right dominant side (HCC)  Acute pain of right shoulder  Other disturbances of skin sensation  Attention and concentration deficit     Problem List Patient Active Problem List   Diagnosis Date Noted  . Neuropathic pain, arm 10/26/2018  . MDD (major depressive disorder), single episode, moderate ([place]) 09/21/2018  . Hemiparesis of right dominant side ([place]) 08/17/2018  . Hematuria 08/17/2018  . Pleuritic chest pain 12/23/2017  . SOB (shortness of breath) 03/08/2017  . Vestibular dizziness 01/21/2016  . Tick bite 07/15/2015  . Cerebrovascular  disease 07/17/2014  . Preventative health care 06/25/2014  . UTI (urinary tract infection) 06/25/2014  . Right sided sciatica 03/08/2013  . MENOPAUSAL SYNDROME 09/10/2008  . Mood disorder ([place]) 09/15/2007  . Essential hypertension, benign 08/10/2006  . Type 2 diabetes mellitus with other circulatory complications ([place]) 02/07/8249  . HYPERCHOLESTEROLEMIA 06/17/2006  . GERD 06/17/2006   Tami Jones, Tami Jones  [place], [place], [place] DPT 11/15/2018, 11:41 AM  [place] MAIN [facility] SERVICES [address] [place], [place], [address] Phone: [phone]   Fax:  [phone]  Name: Tami Jones MRN: [id number] Date of Birth: [birth year]

## 2018-11-14 NOTE — Patient Instructions (Signed)
Hip: Flexion / Stabilization / March   Sitting in chair, march in place by raising your knee. Count to 20. Do twice each day.   Ankle Bend: Dorsiflexion / Plantar Flexion, Sitting    Sit with feet on floor. Point toes up, keeping both heels on floor. Then press toes to floor raising heels. Hold each position 3 seconds. Repeat 10 times per session. Do 2 sessions per day.  Copyright  VHI. All rights reserved.  KNEE: Bend - Sitting    Sitting, fully bend and straighten involved knee 10 times.  Do 2 times per day.  Copyright  VHI. All rights reserved.    Copyright  VHI. All rights reserved.

## 2018-11-16 ENCOUNTER — Encounter: Payer: Managed Care, Other (non HMO) | Admitting: Occupational Therapy

## 2018-11-16 ENCOUNTER — Ambulatory Visit: Payer: Managed Care, Other (non HMO)

## 2018-11-20 ENCOUNTER — Encounter: Payer: Self-pay | Admitting: Occupational Therapy

## 2018-11-20 ENCOUNTER — Ambulatory Visit: Payer: Managed Care, Other (non HMO) | Attending: Internal Medicine | Admitting: Occupational Therapy

## 2018-11-20 ENCOUNTER — Other Ambulatory Visit: Payer: Self-pay

## 2018-11-20 DIAGNOSIS — R2681 Unsteadiness on feet: Secondary | ICD-10-CM | POA: Insufficient documentation

## 2018-11-20 DIAGNOSIS — I69351 Hemiplegia and hemiparesis following cerebral infarction affecting right dominant side: Secondary | ICD-10-CM | POA: Diagnosis present

## 2018-11-20 DIAGNOSIS — M6281 Muscle weakness (generalized): Secondary | ICD-10-CM | POA: Insufficient documentation

## 2018-11-20 DIAGNOSIS — M25511 Pain in right shoulder: Secondary | ICD-10-CM | POA: Diagnosis present

## 2018-11-20 DIAGNOSIS — R2689 Other abnormalities of gait and mobility: Secondary | ICD-10-CM | POA: Diagnosis present

## 2018-11-20 DIAGNOSIS — R278 Other lack of coordination: Secondary | ICD-10-CM | POA: Diagnosis present

## 2018-11-20 NOTE — Therapy (Signed)
[facility] MAIN [facility] SERVICES [address] [place], [place], [address] Phone: [phone]   Fax:  [phone]  Occupational Therapy Treatment  Patient Details  Name: Tami Jones MRN: [id number] Date of Birth: Feb 06, 1956 Referring Provider (OT): Tillman Abide   Encounter Date: 11/20/2018  OT End of Session - 11/20/18 1523    Visit Number  3    Number of Visits  17    Date for OT Re-Evaluation  01/13/19    Authorization Type  [employer] - 60 VISIT LIMIT OT - 8 used    OT Start Time  1515    OT Stop Time  1600    OT Time Calculation (min)  45 min    Behavior During Therapy  [facility] for tasks assessed/performed       Past Medical History:  Diagnosis Date  . Anxiety   . Asthma   . 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 Thad Ranger  . Diabetes mellitus   . GERD (gastroesophageal reflux disease)   . Hyperlipidemia   . Hypertension     Past Surgical History:  Procedure Laterality Date  . CARDIOVASCULAR STRESS TEST  1/15   myoview. EF 60%  . CESAREAN SECTION    . ESOPHAGOGASTRODUODENOSCOPY  05/2005  . TONSILLECTOMY AND ADENOIDECTOMY      There were no vitals filed for this visit.  Subjective Assessment - 11/20/18 1521    Subjective   Pt. reports RLE pain today    Pertinent History  HTN, Hyperlipidemia, DM, anxiety, CVA - 15 years ago with peripheral visual deficit, concussion - 3 years ago    Currently in Pain?  Yes    Pain Score  7     Pain Location  Hip    Pain Orientation  Right    Pain Descriptors / Indicators  Aching    Pain Type  Neuropathic pain      OT TREATMENT    Therapeutic Exercise:  Pt. Worked on pinch strengthening in the left hand for lateral, and 3pt. pinch using yellow, red, and green resistive clips. Pt. worked on placing the clips at various vertical and horizontal angles. Tactile and verbal cues were required for eliciting the desired movement. Pt. Required verbal cues for hand,  and digit placement.  Selfcare:  Pt. worked on [employer] with her bilateral hands using a laptop at the tabletop. Pt. Completed a  typing test with 57% accuracy, 16 errors, and 4 wpm.  Response to Treatment:  Pt. reports 7/10 pain in her Right hip, radiating down her leg today. Pt. is using her theraputty at home. Pt. is increasingly using her right hand to assist with doing dishes, folding laundry at a tabletop, and making her bed. pt. reports that she is now able to do the things she wants to do like she could before. Pt. presents with decreased typing speed, and acuuracy. Pt. reports tightness at times in her right hand. Pt. continues to present with limited RUE strength, hand function, and [facility] skills, and continues to work on these skills to improve UE functioning during ADLs, and IADLs.                       OT Education - 11/20/18 1523    Education Details  RU, and hand function    Person(s) Educated  Patient    Methods  Explanation    Comprehension  Verbalized understanding  OT Short Term Goals - 11/07/18 1638      OT SHORT TERM GOAL #1   Title  Patient will complete a home activities program designed on improving functional use of RUE    Time  4    Period  Weeks    Status  New    Target Date  12/14/18      OT SHORT TERM GOAL #2   Title  Patient will demonstrate sufficient balance to stand at counter to utilize BUE functionally for washing/drying dishes without assistance    Time  4    Period  Weeks    Status  New      OT SHORT TERM GOAL #3   Title  Patient will report pain no greater than 4/10 with RUE gentle mobilization  techniques either land or water based    Time  4    Period  Weeks    Status  New      OT SHORT TERM GOAL #4   Title  Patient will show sufficient right hand control and strength to pick up a full glass or mug of liquid and drink while seated.    Time  4    Period  Weeks    Status  New      OT SHORT TERM GOAL #5    Title  Patient will show improved hand/wrist/forearm coordiantion to legibly write a full paragraph (3 sentences) using right hand.    Time  4    Period  Weeks    Status  New        OT Long Term Goals - 11/07/18 1642      OT LONG TERM GOAL #1   Title  Patient will independently complete an HEP designed to improve/restore RUE range of motion without report of pain    Time  8    Period  Weeks    Status  New    Target Date  01/13/19      OT LONG TERM GOAL #2   Title  Patient will demonstrate sufficient RUE functional grasp and movement as well as balance to replace clean linens on bed, e.g. fitted sheet    Time  8    Period  Weeks    Status  New      OT LONG TERM GOAL #3   Title  Patient will demonstrate sufficient motor control and balance to carry a glass or mug of liquid across 15 foot floor space without spilling    Time  8    Period  Weeks    Status  New      OT LONG TERM GOAL #4   Title  Patient will demonstrate sufficient motor control and strength to utilize bilateral method to cut food as part of meal prep.    Time  8    Period  Weeks    Status  New      OT LONG TERM GOAL #5   Title  Patient will demonstrate understanding of recommendations relating to returning to driving.    Time  8    Period  Weeks    Status  New            Plan - 11/20/18 1524    Clinical Impression Statement  Pt. reports 7/10 pain in her Right hip, radiating down her leg today. Pt. is using her theraputty at home. Pt. is increasingly using her right hand to assist with doing dishes, folding laundry at a tabletop, and  making her bed. pt. reports that she is now able to do the things she wants to do like she could before. Pt. presents with decreased typing speed, and acuuracy. Pt. reports tightness at times in her right hand. Pt. continues to present with limited RUE strength, hand function, and [facility] skills, and continues to work on these skills to improve UE functioning during ADLs, and  IADLs.    OT Occupational Profile and History  Detailed Assessment- Review of Records and additional review of physical, cognitive, psychosocial history related to current functional performance    Occupational performance deficits (Please refer to evaluation for details):  ADL's;IADL's;Leisure;Rest and Sleep;Work    Marketing executive / Function / Physical Skills  ADL;Coordination;Endurance;GMC;Muscle spasms;UE functional use;Balance;Decreased knowledge of precautions;Sensation;Body mechanics;Decreased knowledge of use of DME;Flexibility;IADL;Pain;Vision;Cardiopulmonary status limiting activity;Dexterity;FMC;Proprioception;Strength;Tone;ROM;Mobility;Edema    Cognitive Skills  Attention;Thought;Emotional;Energy/Drive    Psychosocial Skills  Coping Strategies;Habits;Interpersonal Interaction;Routines and Behaviors    Rehab Potential  Good    Clinical Decision Making  Several treatment options, min-mod task modification necessary    Comorbidities Affecting Occupational Performance:  Presence of comorbidities impacting occupational performance    Comorbidities impacting occupational performance description:  HTN, DM, Hyperlipidemia, Anxiety, (self reported depression), 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    OT Frequency  2x / week    OT Duration  8 weeks    OT Treatment/Interventions  Self-care/ADL training;Aquatic Therapy;Cryotherapy;Electrical Stimulation;Moist Heat;Ultrasound;Contrast Bath;Fluidtherapy;Paraffin;Therapeutic exercise;Neuromuscular education;Energy conservation;DME and/or AE instruction;Manual Therapy;Functional Mobility Training;Cognitive remediation/compensation;Therapeutic activities;Splinting;Visual/perceptual remediation/compensation;Patient/family education;Balance training;Psychosocial skills training;Coping strategies training    Consulted and Agree with Plan of Care  Patient       Patient will benefit from skilled therapeutic intervention in order to  improve the following deficits and impairments:   Body Structure / Function / Physical Skills: ADL, Coordination, Endurance, GMC, Muscle spasms, UE functional use, Balance, Decreased knowledge of precautions, Sensation, Body mechanics, Decreased knowledge of use of DME, Flexibility, IADL, Pain, Vision, Cardiopulmonary status limiting activity, Dexterity, FMC, Proprioception, Strength, Tone, ROM, Mobility, Edema Cognitive Skills: Attention, Thought, Emotional, Energy/Drive Psychosocial Skills: Coping Strategies, Habits, Interpersonal Interaction, Routines and Behaviors   Visit Diagnosis: Muscle weakness (generalized)  Other lack of coordination    Problem List Patient Active Problem List   Diagnosis Date Noted  . Neuropathic pain, arm 10/26/2018  . MDD (major depressive disorder), single episode, moderate ([place]) 09/21/2018  . Hemiparesis of right dominant side ([place]) 08/17/2018  . Hematuria 08/17/2018  . Pleuritic chest pain 12/23/2017  . SOB (shortness of breath) 03/08/2017  . Vestibular dizziness 01/21/2016  . Tick bite 07/15/2015  . Cerebrovascular disease 07/17/2014  . Preventative health care 06/25/2014  . UTI (urinary tract infection) 06/25/2014  . Right sided sciatica 03/08/2013  . MENOPAUSAL SYNDROME 09/10/2008  . Mood disorder ([place]) 09/15/2007  . Essential hypertension, benign 08/10/2006  . Type 2 diabetes mellitus with other circulatory complications ([place]) 12/45/8099  . HYPERCHOLESTEROLEMIA 06/17/2006  . GERD 06/17/2006    Harrel Carina, MS, OTR/L 11/20/2018, 5:28 PM  [place] MAIN [facility] SERVICES [address] [place], [place], [address] Phone: [phone]   Fax:  [phone]  Name: Tami Jones MRN: [id number] Date of Birth: Mar 28, 1955

## 2018-11-21 ENCOUNTER — Telehealth: Payer: Self-pay | Admitting: Internal Medicine

## 2018-11-21 NOTE — Telephone Encounter (Signed)
I still don't get the whole process---I think she will continue to stay disabled. Okay to do the form though--I signed it

## 2018-11-21 NOTE — Telephone Encounter (Signed)
Paperwork faxed °

## 2018-11-21 NOTE — Telephone Encounter (Signed)
[facility] faxed attending provider statement Spoke with pt she stated it is for dates 11/16/2018-10/31-2020  In dr letvak's in box For review and signature

## 2018-11-22 ENCOUNTER — Other Ambulatory Visit: Payer: Self-pay

## 2018-11-22 ENCOUNTER — Ambulatory Visit: Payer: Managed Care, Other (non HMO) | Admitting: Occupational Therapy

## 2018-11-22 ENCOUNTER — Encounter: Payer: Self-pay | Admitting: Occupational Therapy

## 2018-11-22 DIAGNOSIS — M6281 Muscle weakness (generalized): Secondary | ICD-10-CM | POA: Diagnosis not present

## 2018-11-22 DIAGNOSIS — R278 Other lack of coordination: Secondary | ICD-10-CM

## 2018-11-22 NOTE — Therapy (Signed)
[place] [facility] MAIN [facility] SERVICES [address] [place], [place], [address] Phone: [phone]   Fax:  [phone]  Occupational Therapy Treatment  Patient Details  Name: Tami Jones MRN: [id number] Date of Birth: Aug 31, 1955 Referring Provider (OT): Tillman Abide   Encounter Date: 11/22/2018  OT End of Session - 11/22/18 1656    Visit Number  4    Number of Visits  17    Date for OT Re-Evaluation  01/13/19    Authorization - Visit Number  1    Authorization - Number of Visits  52    OT Start Time  1520    OT Stop Time  1600    OT Time Calculation (min)  40 min    Activity Tolerance  Patient tolerated treatment well;Patient limited by pain    Behavior During Therapy  [facility] for tasks assessed/performed       Past Medical History:  Diagnosis Date  . Anxiety   . Asthma   . 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 Thad Ranger  . Diabetes mellitus   . GERD (gastroesophageal reflux disease)   . Hyperlipidemia   . Hypertension     Past Surgical History:  Procedure Laterality Date  . CARDIOVASCULAR STRESS TEST  1/15   myoview. EF 60%  . CESAREAN SECTION    . ESOPHAGOGASTRODUODENOSCOPY  05/2005  . TONSILLECTOMY AND ADENOIDECTOMY      There were no vitals filed for this visit.  Subjective Assessment - 11/22/18 1652    Subjective   Pt. reports that her LLE is hurting today.    Patient is accompanied by:  Family member    Pertinent History  HTN, Hyperlipidemia, DM, anxiety, CVA - 15 years ago with peripheral visual deficit, concussion - 3 years ago    Currently in Pain?  Yes    Pain Score  7    RUE   Pain Location  Arm    Pain Orientation  Right    Pain Descriptors / Indicators  Aching;Constant    Pain Type  Neuropathic pain   Radiating from her shoulder to her hand   Pain Onset  More than a month ago    Pain Score  7   7/10 LLE & 5/10 RLE   Pain Location  Leg    Pain Orientation  Left;Right    Pain  Descriptors / Indicators  Aching    Pain Onset  More than a month ago      OT TREATMENT     Therapeutic Exercise:  Pt. performed AAROM/AROM  for shoulder flexion, extension, and abduction in supine following moist heat modality. Pt. worked on [employer] in sitting at the tabletop for shoulder flexion, internal, and external rotation. Pt. Education was provided about exercises and positioning of the UE at home.   Moist heat modality:  Pt. tolerated moist heat to the right shoulder in conjunction with ROM. Pt. education was provided about positioning of the RUE with the shoulder in protraction when in supine.   Response to Treatment  Pt. is progressing, and is now engaging her RUE more during ADL, and IADL tasks at home including folding laundry, making her bed, and washing dishes, and  Pt. reports 7/10 pain in her RUE , 7/10 pain in her LLE, and 5/10 pain in her left hip. Pt. education was provided about positioning of the RUE during the night time, and ROM exercises. Pt. was educated about avoiding heavy lifting with the  RUE or resistive exercises at this time. Pt. reports she has been doing theraband exercises given to her in home health, but has had pain with them.  Pt. continues to present with limited R UE strength, and [facility] skills, and continues to work on improving RUE functioning in order to improve engagenment during daily tasks, and work towards maximizing independence with ADLs, and IADLs                      OT Education - 11/22/18 1656    Education Details  RUE ROM, positioning, and hand function    Person(s) Educated  Patient    Methods  Explanation    Comprehension  Verbalized understanding       OT Short Term Goals - 11/07/18 1638      OT SHORT TERM GOAL #1   Title  Patient will complete a home activities program designed on improving functional use of RUE    Time  4    Period  Weeks    Status  New    Target Date  12/14/18      OT SHORT TERM GOAL #2    Title  Patient will demonstrate sufficient balance to stand at counter to utilize BUE functionally for washing/drying dishes without assistance    Time  4    Period  Weeks    Status  New      OT SHORT TERM GOAL #3   Title  Patient will report pain no greater than 4/10 with RUE gentle mobilization  techniques either land or water based    Time  4    Period  Weeks    Status  New      OT SHORT TERM GOAL #4   Title  Patient will show sufficient right hand control and strength to pick up a full glass or mug of liquid and drink while seated.    Time  4    Period  Weeks    Status  New      OT SHORT TERM GOAL #5   Title  Patient will show improved hand/wrist/forearm coordiantion to legibly write a full paragraph (3 sentences) using right hand.    Time  4    Period  Weeks    Status  New        OT Long Term Goals - 11/07/18 1642      OT LONG TERM GOAL #1   Title  Patient will independently complete an HEP designed to improve/restore RUE range of motion without report of pain    Time  8    Period  Weeks    Status  New    Target Date  01/13/19      OT LONG TERM GOAL #2   Title  Patient will demonstrate sufficient RUE functional grasp and movement as well as balance to replace clean linens on bed, e.g. fitted sheet    Time  8    Period  Weeks    Status  New      OT LONG TERM GOAL #3   Title  Patient will demonstrate sufficient motor control and balance to carry a glass or mug of liquid across 15 foot floor space without spilling    Time  8    Period  Weeks    Status  New      OT LONG TERM GOAL #4   Title  Patient will demonstrate sufficient motor control and strength to utilize bilateral method  to cut food as part of meal prep.    Time  8    Period  Weeks    Status  New      OT LONG TERM GOAL #5   Title  Patient will demonstrate understanding of recommendations relating to returning to driving.    Time  8    Period  Weeks    Status  New            Plan - 11/22/18  1658    Clinical Impression Statement  Pt. is progressing, and is now engaging her RUE more during ADL, and IADL tasks at home including folding laundry, making her bed, and washing dishes, and  Pt. reports 7/10 pain in her RUE , 7/10 pain in her LLE, and 5/10 pain in her left hip. Pt. education was provided about positioning of the RUE during the night time, and ROM exercises. Pt. was educated about avoiding heavy lifting with the RUE or resistive exercises at this time. Pt. reports she has been doing theraband exercises given to her in home health, but has had pain with them.  Pt. continues to present with limited R UE strength, and [facility] skills, and continues to work on improving RUE functioning in order to improve engagenment during daily tasks, and work towards maximizing independence with ADLs, and IADLs.    OT Occupational Profile and History  Detailed Assessment- Review of Records and additional review of physical, cognitive, psychosocial history related to current functional performance    Occupational performance deficits (Please refer to evaluation for details):  ADL's;IADL's;Leisure;Rest and Sleep;Work    Games developer / Function / Physical Skills  ADL;Coordination;Endurance;GMC;Muscle spasms;UE functional use;Balance;Decreased knowledge of precautions;Sensation;Body mechanics;Decreased knowledge of use of DME;Flexibility;IADL;Pain;Vision;Cardiopulmonary status limiting activity;Dexterity;FMC;Proprioception;Strength;Tone;ROM;Mobility;Edema    Cognitive Skills  Attention;Thought;Emotional;Energy/Drive    Psychosocial Skills  Coping Strategies;Habits;Interpersonal Interaction;Routines and Behaviors    Rehab Potential  Good    Clinical Decision Making  Several treatment options, min-mod task modification necessary    Comorbidities Affecting Occupational Performance:  Presence of comorbidities impacting occupational performance    Modification or Assistance to Complete Evaluation   Min-Moderate  modification of tasks or assist with assess necessary to complete eval    OT Frequency  2x / week    OT Duration  8 weeks    OT Treatment/Interventions  Self-care/ADL training;Aquatic Therapy;Cryotherapy;Electrical Stimulation;Moist Heat;Ultrasound;Contrast Bath;Fluidtherapy;Paraffin;Therapeutic exercise;Neuromuscular education;Energy conservation;DME and/or AE instruction;Manual Therapy;Functional Mobility Training;Cognitive remediation/compensation;Therapeutic activities;Splinting;Visual/perceptual remediation/compensation;Patient/family education;Balance training;Psychosocial skills training;Coping strategies training    Consulted and Agree with Plan of Care  Patient       Patient will benefit from skilled therapeutic intervention in order to improve the following deficits and impairments:   Body Structure / Function / Physical Skills: ADL, Coordination, Endurance, GMC, Muscle spasms, UE functional use, Balance, Decreased knowledge of precautions, Sensation, Body mechanics, Decreased knowledge of use of DME, Flexibility, IADL, Pain, Vision, Cardiopulmonary status limiting activity, Dexterity, FMC, Proprioception, Strength, Tone, ROM, Mobility, Edema Cognitive Skills: Attention, Thought, Emotional, Energy/Drive Psychosocial Skills: Coping Strategies, Habits, Interpersonal Interaction, Routines and Behaviors   Visit Diagnosis: Muscle weakness (generalized)  Other lack of coordination    Problem List Patient Active Problem List   Diagnosis Date Noted  . Neuropathic pain, arm 10/26/2018  . MDD (major depressive disorder), single episode, moderate (HCC) 09/21/2018  . Hemiparesis of right dominant side (HCC) 08/17/2018  . Hematuria 08/17/2018  . Pleuritic chest pain 12/23/2017  . SOB (shortness of breath) 03/08/2017  . Vestibular dizziness 01/21/2016  . Tick bite  07/15/2015  . Cerebrovascular disease 07/17/2014  . Preventative health care 06/25/2014  . UTI (urinary tract infection)  06/25/2014  . Right sided sciatica 03/08/2013  . MENOPAUSAL SYNDROME 09/10/2008  . Mood disorder ([place]) 09/15/2007  . Essential hypertension, benign 08/10/2006  . Type 2 diabetes mellitus with other circulatory complications ([place]) 70/96/4383  . HYPERCHOLESTEROLEMIA 06/17/2006  . GERD 06/17/2006    Harrel Carina, MS, OTR/L 11/22/2018, 5:12 PM  [place] MAIN [facility] SERVICES [address] [place], [place], [address] Phone: [phone]   Fax:  [phone]  Name: Tami Jones MRN: [id number] Date of Birth: 06-14-1955

## 2018-11-23 ENCOUNTER — Ambulatory Visit: Payer: Managed Care, Other (non HMO) | Admitting: Physical Therapy

## 2018-11-24 NOTE — Telephone Encounter (Signed)
Pt aware  °Copy for pt °Copy for scan °

## 2018-11-27 ENCOUNTER — Ambulatory Visit: Payer: Managed Care, Other (non HMO) | Admitting: Physical Therapy

## 2018-11-28 ENCOUNTER — Encounter: Payer: Self-pay | Admitting: Occupational Therapy

## 2018-11-28 ENCOUNTER — Other Ambulatory Visit: Payer: Self-pay

## 2018-11-28 ENCOUNTER — Ambulatory Visit: Payer: Managed Care, Other (non HMO) | Admitting: Occupational Therapy

## 2018-11-28 DIAGNOSIS — M6281 Muscle weakness (generalized): Secondary | ICD-10-CM

## 2018-11-28 DIAGNOSIS — R278 Other lack of coordination: Secondary | ICD-10-CM

## 2018-11-28 NOTE — Therapy (Signed)
[facility] MAIN [facility] SERVICES [address] [place], [place], [address] Phone: [phone]   Fax:  [phone]  Occupational Therapy Treatment  Patient Details  Name: Tami Jones MRN: [id number] Date of Birth: December 09, 1955 Referring Provider (OT): Tillman Abide   Encounter Date: 11/28/2018  OT End of Session - 11/28/18 1753    Visit Number  5    Number of Visits  17    Date for OT Re-Evaluation  01/13/19    Authorization Type  [employer] - 60 VISIT LIMIT OT - 8 used    Authorization - Visit Number  1    Authorization - Number of Visits  52    OT Start Time  1600    OT Stop Time  1645    OT Time Calculation (min)  45 min    Activity Tolerance  Patient tolerated treatment well;Patient limited by pain    Behavior During Therapy  [facility] for tasks assessed/performed       Past Medical History:  Diagnosis Date  . Anxiety   . Asthma   . 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 Thad Ranger  . Diabetes mellitus   . GERD (gastroesophageal reflux disease)   . Hyperlipidemia   . Hypertension     Past Surgical History:  Procedure Laterality Date  . CARDIOVASCULAR STRESS TEST  1/15   myoview. EF 60%  . CESAREAN SECTION    . ESOPHAGOGASTRODUODENOSCOPY  05/2005  . TONSILLECTOMY AND ADENOIDECTOMY      There were no vitals filed for this visit.  Subjective Assessment - 11/28/18 1750    Subjective   Pt. reports that she hurt her right arm last night in the shower, and has been having shoulder pain ever since.    Patient is accompanied by:  Family member    Pertinent History  HTN, Hyperlipidemia, DM, anxiety, CVA - 15 years ago with peripheral visual deficit, concussion - 3 years ago    Currently in Pain?  Yes    Pain Score  7     Pain Location  Shoulder    Pain Orientation  Right    Pain Descriptors / Indicators  Aching    Pain Type  Acute pain;Neuropathic pain    Pain Onset  More than a month ago   chronic,  however hd a new inujry last night to her right shoulder    OT TREATMENT    Neuro muscular re-education:  Pt. worked on grasping 1" resistive cubes with her right hand while alternating thumb opposition to the tip of the 2nd through 5th digits while the board is placed at a vertical angle. Pt. worked on pressing the cubes back into place while alternating isolated 2nd through 5th digit extension.the patient. Worked on grasping, and manipulating 1/2" flat marbles , and translatory skills moving them through her to the tip of her 2nd digit, and thumb. Pt. Progress to working on larger coins, and stacking them. Pt. education was provided about [facility] tasks that can be performed at home.  Response to Treatment:  Pt. reports 7/10 right shoulder pain. Pt. reports hurting her right shoulder last night while in the shower, her young grandson yanked her right UE posteriorly. Pt. reports increased pain, and reports being tearful since since that happened. Pt. education was provided about positioning of the RUE for comfort, and rest since it occurred last night. Pt. has an appointment with her Physician on Monday, Oct. 19th. Pt. was able to  tolerate working on [facility] skills with the right hand, at the tabletop. Pt. continues to work on improving [facility] skills in order to improve UE functioning during ADLs, and IADLs.                        OT Education - 11/28/18 1753    Education Details  RUE ROM, positioning, and hand function    Person(s) Educated  Patient    Methods  Explanation    Comprehension  Verbalized understanding       OT Short Term Goals - 11/07/18 1638      OT SHORT TERM GOAL #1   Title  Patient will complete a home activities program designed on improving functional use of RUE    Time  4    Period  Weeks    Status  New    Target Date  12/14/18      OT SHORT TERM GOAL #2   Title  Patient will demonstrate sufficient balance to stand at counter to utilize BUE functionally  for washing/drying dishes without assistance    Time  4    Period  Weeks    Status  New      OT SHORT TERM GOAL #3   Title  Patient will report pain no greater than 4/10 with RUE gentle mobilization  techniques either land or water based    Time  4    Period  Weeks    Status  New      OT SHORT TERM GOAL #4   Title  Patient will show sufficient right hand control and strength to pick up a full glass or mug of liquid and drink while seated.    Time  4    Period  Weeks    Status  New      OT SHORT TERM GOAL #5   Title  Patient will show improved hand/wrist/forearm coordiantion to legibly write a full paragraph (3 sentences) using right hand.    Time  4    Period  Weeks    Status  New        OT Long Term Goals - 11/07/18 1642      OT LONG TERM GOAL #1   Title  Patient will independently complete an HEP designed to improve/restore RUE range of motion without report of pain    Time  8    Period  Weeks    Status  New    Target Date  01/13/19      OT LONG TERM GOAL #2   Title  Patient will demonstrate sufficient RUE functional grasp and movement as well as balance to replace clean linens on bed, e.g. fitted sheet    Time  8    Period  Weeks    Status  New      OT LONG TERM GOAL #3   Title  Patient will demonstrate sufficient motor control and balance to carry a glass or mug of liquid across 15 foot floor space without spilling    Time  8    Period  Weeks    Status  New      OT LONG TERM GOAL #4   Title  Patient will demonstrate sufficient motor control and strength to utilize bilateral method to cut food as part of meal prep.    Time  8    Period  Weeks    Status  New      OT LONG  TERM GOAL #5   Title  Patient will demonstrate understanding of recommendations relating to returning to driving.    Time  8    Period  Weeks    Status  New            Plan - 11/28/18 1754    Clinical Impression Statement Pt. reports 7/10 right shoulder pain. Pt. reports hurting  her right shoulder last night while in the shower, her young grandson yanked her right UE posteriorly. Pt. reports increased pain, and reports being tearful since since that happened. Pt. education was provided about positioning of the RUE for comfort, and rest since it occurred last night. Pt. has an appointment with her Physician on Monday, Oct. 19th. Pt. was able to tolerate working on [facility] skills with the right hand, at the tabletop. Pt. continues to work on improving [facility] skills in order to improve UE functioning during ADLs, and IADLs.    OT Occupational Profile and History  Detailed Assessment- Review of Records and additional review of physical, cognitive, psychosocial history related to current functional performance    Occupational performance deficits (Please refer to evaluation for details):  ADL's;IADL's;Leisure;Rest and Sleep;Work    Marketing executive / Function / Physical Skills  ADL;Coordination;Endurance;GMC;Muscle spasms;UE functional use;Balance;Decreased knowledge of precautions;Sensation;Body mechanics;Decreased knowledge of use of DME;Flexibility;IADL;Pain;Vision;Cardiopulmonary status limiting activity;Dexterity;FMC;Proprioception;Strength;Tone;ROM;Mobility;Edema    Cognitive Skills  Attention;Thought;Emotional;Energy/Drive    Psychosocial Skills  Coping Strategies;Habits;Interpersonal Interaction;Routines and Behaviors    Rehab Potential  Good    Clinical Decision Making  Several treatment options, min-mod task modification necessary    Comorbidities Affecting Occupational Performance:  Presence of comorbidities impacting occupational performance    Comorbidities impacting occupational performance description:  HTN, DM, Hyperlipidemia, Anxiety, (self reported depression), 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    Modification or Assistance to Complete Evaluation   Min-Moderate modification of tasks or assist with assess necessary to complete eval    OT Frequency  2x  / week    OT Duration  8 weeks    OT Treatment/Interventions  Self-care/ADL training;Aquatic Therapy;Cryotherapy;Electrical Stimulation;Moist Heat;Ultrasound;Contrast Bath;Fluidtherapy;Paraffin;Therapeutic exercise;Neuromuscular education;Energy conservation;DME and/or AE instruction;Manual Therapy;Functional Mobility Training;Cognitive remediation/compensation;Therapeutic activities;Splinting;Visual/perceptual remediation/compensation;Patient/family education;Balance training;Psychosocial skills training;Coping strategies training    Consulted and Agree with Plan of Care  Patient       Patient will benefit from skilled therapeutic intervention in order to improve the following deficits and impairments:   Body Structure / Function / Physical Skills: ADL, Coordination, Endurance, GMC, Muscle spasms, UE functional use, Balance, Decreased knowledge of precautions, Sensation, Body mechanics, Decreased knowledge of use of DME, Flexibility, IADL, Pain, Vision, Cardiopulmonary status limiting activity, Dexterity, FMC, Proprioception, Strength, Tone, ROM, Mobility, Edema Cognitive Skills: Attention, Thought, Emotional, Energy/Drive Psychosocial Skills: Coping Strategies, Habits, Interpersonal Interaction, Routines and Behaviors   Visit Diagnosis: Muscle weakness (generalized)  Other lack of coordination    Problem List Patient Active Problem List   Diagnosis Date Noted  . Neuropathic pain, arm 10/26/2018  . MDD (major depressive disorder), single episode, moderate ([place]) 09/21/2018  . Hemiparesis of right dominant side ([place]) 08/17/2018  . Hematuria 08/17/2018  . Pleuritic chest pain 12/23/2017  . SOB (shortness of breath) 03/08/2017  . Vestibular dizziness 01/21/2016  . Tick bite 07/15/2015  . Cerebrovascular disease 07/17/2014  . Preventative health care 06/25/2014  . UTI (urinary tract infection) 06/25/2014  . Right sided sciatica 03/08/2013  . MENOPAUSAL SYNDROME 09/10/2008  . Mood  disorder ([place]) 09/15/2007  . Essential hypertension, benign 08/10/2006  .  Type 2 diabetes mellitus with other circulatory complications (HCC) 06/17/2006  . HYPERCHOLESTEROLEMIA 06/17/2006  . GERD 06/17/2006    Olegario Messier, MS, OTR/L 11/28/2018, 6:01 PM  [facility] MAIN [facility] SERVICES [address] [place], [place], [address] Phone: [phone]   Fax:  [phone]  Name: TEWANA BOHLEN MRN: [id number] Date of Birth: Jun 28, 1955

## 2018-11-30 ENCOUNTER — Ambulatory Visit: Payer: Managed Care, Other (non HMO) | Admitting: Rehabilitative and Restorative Service Providers"

## 2018-11-30 ENCOUNTER — Encounter: Payer: Self-pay | Admitting: Ophthalmology

## 2018-11-30 ENCOUNTER — Encounter: Payer: Self-pay | Admitting: Occupational Therapy

## 2018-11-30 ENCOUNTER — Other Ambulatory Visit: Payer: Self-pay

## 2018-11-30 ENCOUNTER — Ambulatory Visit: Payer: Managed Care, Other (non HMO) | Admitting: Occupational Therapy

## 2018-11-30 DIAGNOSIS — M6281 Muscle weakness (generalized): Secondary | ICD-10-CM | POA: Diagnosis not present

## 2018-11-30 DIAGNOSIS — R278 Other lack of coordination: Secondary | ICD-10-CM

## 2018-11-30 NOTE — Therapy (Signed)
[place] [facility] MAIN [facility] SERVICES [address] [place], [place], [address] Phone: [phone]   Fax:  [phone]  Occupational Therapy Treatment  Patient Details  Name: Tami Jones MRN: [id number] Date of Birth: [birth year] Referring Provider (OT): Tillman Abide   Encounter Date: 11/30/2018  OT End of Session - 11/30/18 1658    Visit Number  6    Number of Visits  17    Date for OT Re-Evaluation  01/13/19    Authorization Type  [employer] - 60 VISIT LIMIT OT - 8 used    Authorization Time Period  Progress report period starting 11/14/2018    Authorization - Visit Number  1    Authorization - Number of Visits  52    OT Start Time  1647    OT Stop Time  1530    OT Time Calculation (min)  1363 min    Activity Tolerance  Patient tolerated treatment well;Patient limited by pain    Behavior During Therapy  [facility] for tasks assessed/performed       Past Medical History:  Diagnosis Date  . Anxiety   . Asthma   . 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 Thad Ranger  . Diabetes mellitus   . GERD (gastroesophageal reflux disease)   . Hyperlipidemia   . Hypertension     Past Surgical History:  Procedure Laterality Date  . CARDIOVASCULAR STRESS TEST  1/15   myoview. EF 60%  . CESAREAN SECTION    . ESOPHAGOGASTRODUODENOSCOPY  05/2005  . TONSILLECTOMY AND ADENOIDECTOMY      There were no vitals filed for this visit.  Subjective Assessment - 11/30/18 1656    Subjective   Pt. reports having an MD appointment on Monday.    Patient is accompanied by:  Family member    Pertinent History  HTN, Hyperlipidemia, DM, anxiety, CVA - 15 years ago with peripheral visual deficit, concussion - 3 years ago    Currently in Pain?  Yes    Pain Score  7     Pain Location  Shoulder    Pain Orientation  Right    Pain Descriptors / Indicators  Aching;Sharp      OT TREATMENT    Neuro muscular re-education:  Pt. worked on grasping  1" resistive cubes alternating thumb opposition to the tip of the 2nd digit while the board is placed at a vertical angle. Pt. worked on pressing the cubes back into place while alternating isolated 2nd digit extension. Pt. performed [facility] tasks using the Grooved pegboard. Pt. worked on grasping the grooved pegs from a horizontal position, and moving the pegs to a vertical position in the hand to prepare for placing them in the grooved slot. Pt. worked on grasping 1/2" small pegs, and placed them onto and extra small pegboard. Pt. worked on using her hand for storage, as well as translatory movements of the right hand.   Manual Therapy:  Pt. Tolerated metacarpal spread stretches of the right hand to decreased tightness, and prepare the hand to engage in functional tasks.   Response to Treatment:  Pt. reports 7/10 pain in her right shoulder. Pt. has had chronic pain in her right shoulder, however it has been aggravated by her grandson pulling it posteriorly earlier in the week. Pt. reports increased pain since this happened. Pt. reports being tearful over this past week because she is not able to do the things that she used to do. Pt. reports that  she used to enjoy horseback riding, and gardening howeverhas not been able to do those things since the onset of her CVA. Pt. reports that she has been engaging her RUE in ADL/IADL tasks at home washing dishes, and making her bed. Pt. tolerate moist heat modailty to her right shoulder today while using her right hand during [facility] tasks at the tabletop. Pt. education was provided about resting her right shoulder, and avoiding heavy lifting or resitive band exercises. Pt. worked on [facility] skills, and tolerated them well, however fatigues with activity, and reports tightening in her right hand. Pt. tolerated gentle meatcarpal spread stretches of the right hand. Pt. in order to improve ADL, and IADL functioning.                          OT Education -  11/30/18 1658    Education Details  positioning, and hand function    Person(s) Educated  Patient    Methods  Explanation    Comprehension  Verbalized understanding       OT Short Term Goals - 11/07/18 1638      OT SHORT TERM GOAL #1   Title  Patient will complete a home activities program designed on improving functional use of RUE    Time  4    Period  Weeks    Status  New    Target Date  12/14/18      OT SHORT TERM GOAL #2   Title  Patient will demonstrate sufficient balance to stand at counter to utilize BUE functionally for washing/drying dishes without assistance    Time  4    Period  Weeks    Status  New      OT SHORT TERM GOAL #3   Title  Patient will report pain no greater than 4/10 with RUE gentle mobilization  techniques either land or water based    Time  4    Period  Weeks    Status  New      OT SHORT TERM GOAL #4   Title  Patient will show sufficient right hand control and strength to pick up a full glass or mug of liquid and drink while seated.    Time  4    Period  Weeks    Status  New      OT SHORT TERM GOAL #5   Title  Patient will show improved hand/wrist/forearm coordiantion to legibly write a full paragraph (3 sentences) using right hand.    Time  4    Period  Weeks    Status  New        OT Long Term Goals - 11/07/18 1642      OT LONG TERM GOAL #1   Title  Patient will independently complete an HEP designed to improve/restore RUE range of motion without report of pain    Time  8    Period  Weeks    Status  New    Target Date  01/13/19      OT LONG TERM GOAL #2   Title  Patient will demonstrate sufficient RUE functional grasp and movement as well as balance to replace clean linens on bed, e.g. fitted sheet    Time  8    Period  Weeks    Status  New      OT LONG TERM GOAL #3   Title  Patient will demonstrate sufficient motor control and balance to carry a  glass or mug of liquid across 15 foot floor space without spilling    Time  8     Period  Weeks    Status  New      OT LONG TERM GOAL #4   Title  Patient will demonstrate sufficient motor control and strength to utilize bilateral method to cut food as part of meal prep.    Time  8    Period  Weeks    Status  New      OT LONG TERM GOAL #5   Title  Patient will demonstrate understanding of recommendations relating to returning to driving.    Time  8    Period  Weeks    Status  New            Plan - 11/30/18 1701    Clinical Impression Statement  Pt. reports 7/10 pain in her right shoulder. Pt. has had chronic pain in her right shoulder, however it has been aggravated by her grandson pulling it posteriorly earlier in the week. Pt. reports increased pain since this happened. Pt. reports being tearful over this past week because she is not able to do the things that she used to do. Pt. reports that she used to enjoy horseback riding, and gardening howeverhas not been able to do those things since the onset of her CVA. Pt. reports that she has been engaging her RUE in ADL/IADL tasks at home washing dishes, and making her bed. Pt. tolerate moist heat modailty to her right shoulder today. Pt. education was provided about resting her right shoulder, and avoiding heavy lifting or resitive band exercises. Pt. worked on [facility] skills, and tolerated them well, however fatigues with activity, and reports tightening in her right hand. Pt. tolerated gentle meatcarpal spread stretches of the right hand. Pt. in order to improve ADL, and IADL functioning.    OT Occupational Profile and History  Detailed Assessment- Review of Records and additional review of physical, cognitive, psychosocial history related to current functional performance    Occupational performance deficits (Please refer to evaluation for details):  ADL's;IADL's;Leisure;Rest and Sleep;Work    Games developerBody Structure / Function / Physical Skills  ADL;Coordination;Endurance;GMC;Muscle spasms;UE functional use;Balance;Decreased  knowledge of precautions;Sensation;Body mechanics;Decreased knowledge of use of DME;Flexibility;IADL;Pain;Vision;Cardiopulmonary status limiting activity;Dexterity;FMC;Proprioception;Strength;Tone;ROM;Mobility;Edema    Cognitive Skills  Attention;Thought;Emotional;Energy/Drive    Psychosocial Skills  Coping Strategies;Habits;Interpersonal Interaction;Routines and Behaviors    Rehab Potential  Good    Clinical Decision Making  Several treatment options, min-mod task modification necessary    Comorbidities Affecting Occupational Performance:  Presence of comorbidities impacting occupational performance    Comorbidities impacting occupational performance description:  HTN, DM, Hyperlipidemia, Anxiety, (self reported depression), 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    Modification or Assistance to Complete Evaluation   Min-Moderate modification of tasks or assist with assess necessary to complete eval    OT Frequency  2x / week    OT Duration  8 weeks    OT Treatment/Interventions  Self-care/ADL training;Aquatic Therapy;Cryotherapy;Electrical Stimulation;Moist Heat;Ultrasound;Contrast Bath;Fluidtherapy;Paraffin;Therapeutic exercise;Neuromuscular education;Energy conservation;DME and/or AE instruction;Manual Therapy;Functional Mobility Training;Cognitive remediation/compensation;Therapeutic activities;Splinting;Visual/perceptual remediation/compensation;Patient/family education;Balance training;Psychosocial skills training;Coping strategies training    Recommended Other Services  aquatic therapy    Consulted and Agree with Plan of Care  Patient       Patient will benefit from skilled therapeutic intervention in order to improve the following deficits and impairments:   Body Structure / Function / Physical Skills: ADL, Coordination, Endurance, GMC, Muscle spasms, UE  functional use, Balance, Decreased knowledge of precautions, Sensation, Body mechanics, Decreased knowledge of use of  DME, Flexibility, IADL, Pain, Vision, Cardiopulmonary status limiting activity, Dexterity, FMC, Proprioception, Strength, Tone, ROM, Mobility, Edema Cognitive Skills: Attention, Thought, Emotional, Energy/Drive Psychosocial Skills: Coping Strategies, Habits, Interpersonal Interaction, Routines and Behaviors   Visit Diagnosis: Muscle weakness (generalized)  Other lack of coordination    Problem List Patient Active Problem List   Diagnosis Date Noted  . Neuropathic pain, arm 10/26/2018  . MDD (major depressive disorder), single episode, moderate (HCC) 09/21/2018  . Hemiparesis of right dominant side (HCC) 08/17/2018  . Hematuria 08/17/2018  . Pleuritic chest pain 12/23/2017  . SOB (shortness of breath) 03/08/2017  . Vestibular dizziness 01/21/2016  . Tick bite 07/15/2015  . Cerebrovascular disease 07/17/2014  . Preventative health care 06/25/2014  . UTI (urinary tract infection) 06/25/2014  . Right sided sciatica 03/08/2013  . MENOPAUSAL SYNDROME 09/10/2008  . Mood disorder (HCC) 09/15/2007  . Essential hypertension, benign 08/10/2006  . Type 2 diabetes mellitus with other circulatory complications (HCC) 06/17/2006  . HYPERCHOLESTEROLEMIA 06/17/2006  . GERD 06/17/2006    Olegario Messier, MS, OTR/L 11/30/2018, 5:55 PM  [place] [facility] MAIN [facility] SERVICES [address] [place], [place], [address] Phone: [phone]   Fax:  [phone]  Name: SARAIH LORTON MRN: [id number] Date of Birth: 1955/11/18

## 2018-12-04 ENCOUNTER — Ambulatory Visit: Payer: Managed Care, Other (non HMO) | Admitting: Internal Medicine

## 2018-12-04 ENCOUNTER — Encounter: Payer: Self-pay | Admitting: Internal Medicine

## 2018-12-04 ENCOUNTER — Other Ambulatory Visit: Payer: Self-pay

## 2018-12-04 VITALS — BP 116/78 | HR 88 | Temp 97.3°F | Ht 63.0 in | Wt 239.0 lb

## 2018-12-04 DIAGNOSIS — M25511 Pain in right shoulder: Secondary | ICD-10-CM

## 2018-12-04 DIAGNOSIS — E1159 Type 2 diabetes mellitus with other circulatory complications: Secondary | ICD-10-CM | POA: Diagnosis not present

## 2018-12-04 DIAGNOSIS — I10 Essential (primary) hypertension: Secondary | ICD-10-CM

## 2018-12-04 DIAGNOSIS — I69351 Hemiplegia and hemiparesis following cerebral infarction affecting right dominant side: Secondary | ICD-10-CM | POA: Diagnosis not present

## 2018-12-04 DIAGNOSIS — F321 Major depressive disorder, single episode, moderate: Secondary | ICD-10-CM

## 2018-12-04 LAB — POCT GLYCOSYLATED HEMOGLOBIN (HGB A1C): Hemoglobin A1C: 7.7 % — AB (ref 4.0–5.6)

## 2018-12-04 NOTE — Progress Notes (Signed)
Subjective:    Patient ID: Tami Jones, female    DOB: 1955/11/08, 63 y.o.   MRN: [id number]  HPI Here for follow up after stroke Not doing so well due to increased pain--especially in right shoulder but some in left arm and right leg also Feels drained over the past couple of days--"no energy"  Not making much progress with the therapy Walks with walker Goes to bathroom on her own---with commode seat  Showers with chair---with standby assist  Checking sugars occasionally Has cut out bread recently--plans to reduce other starches as well  Is taking the citalopram Still crying regularly  Current Outpatient Medications on File Prior to Visit  Medication Sig Dispense Refill  . amLODipine (NORVASC) 5 MG tablet Take 5 mg by mouth 2 (two) times daily.    Marland Kitchen aspirin EC 81 MG tablet Take 81 mg by mouth daily.    Marland Kitchen atorvastatin (LIPITOR) 80 MG tablet Take 1 tablet (80 mg total) by mouth daily. 90 tablet 3  . citalopram (CELEXA) 20 MG tablet Take 1 tablet (20 mg total) by mouth daily. 90 tablet 3  . clopidogrel (PLAVIX) 75 MG tablet Take 1 tablet by mouth daily.    Marland Kitchen gabapentin (NEURONTIN) 300 MG capsule Take 1 capsule (300 mg total) by mouth at bedtime. Increase to 2 capsules in ineffective. 60 capsule 3  . glipiZIDE (GLUCOTROL XL) 5 MG 24 hr tablet TAKE 1 TABLET BY MOUTH EVERYDAY WITH BREAKFAST 90 tablet 3  . lansoprazole (PREVACID) 30 MG capsule Take 1 capsule (30 mg total) by mouth daily at 12 noon. 30 capsule 11  . lisinopril (PRINIVIL,ZESTRIL) 20 MG tablet TAKE 1 TABLET BY MOUTH DAILY 30 tablet 10  . nystatin (MYCOSTATIN/NYSTOP) powder APPLY EXTERNALLY TO THE AFFECTED AREA TWICE DAILY 60 g 3   No current facility-administered medications on file prior to visit.     Allergies  Allergen Reactions  . Citalopram Other (See Comments)    Feels odd  . Doxycycline Hyclate Nausea And Vomiting  . Erythromycin Other (See Comments)    Irritated stomach  . Montelukast Sodium Itching  .  Nitrofurantoin Nausea And Vomiting  . Sulfa Antibiotics Nausea And Vomiting  . Tramadol Hcl Other (See Comments)    Feels odd  . Venlafaxine Hcl Other (See Comments)    Feels odd  . Amoxicillin-Pot Clavulanate Nausea And Vomiting  . Clarithromycin Rash     See ER note 02/2017    Past Medical History:  Diagnosis Date  . Anxiety   . Asthma   . CVA (cerebral vascular accident) ([place]) 03/2006   right occipital, Dr. Doy Mince  . Diabetes mellitus   . GERD (gastroesophageal reflux disease)   . Hyperlipidemia   . Hypertension     Past Surgical History:  Procedure Laterality Date  . CARDIOVASCULAR STRESS TEST  1/15   myoview. EF 60%  . CESAREAN SECTION    . ESOPHAGOGASTRODUODENOSCOPY  05/2005  . TONSILLECTOMY AND ADENOIDECTOMY      Family History  Problem Relation Age of Onset  . Coronary artery disease Mother   . Diabetes Mellitus II Mother   . Heart attack Mother 61  . Diabetes Mellitus II Maternal Grandmother     Social History   Socioeconomic History  . Marital status: Divorced    Spouse name: Not on file  . Number of children: 2  . Years of education: Not on file  . Highest education level: Not on file  Occupational History  . Occupation: [employer]  Employer: [employer]  Social Needs  . Financial resource strain: Not on file  . Food insecurity    Worry: Not on file    Inability: Not on file  . Transportation needs    Medical: Not on file    Non-medical: Not on file  Tobacco Use  . Smoking status: Never Smoker  . Smokeless tobacco: Never Used  Substance and Sexual Activity  . Alcohol use: No    Alcohol/week: 0.0 standard drinks    Comment: wine occassionally  . Drug use: No  . Sexual activity: Yes    Birth control/protection: Condom  Lifestyle  . Physical activity    Days per week: Not on file    Minutes per session: Not on file  . Stress: Not on file  Relationships  . Social Musician on phone: Not on file    Gets together: Not on file     Attends religious service: Not on file    Active member of club or organization: Not on file    Attends meetings of clubs or organizations: Not on file    Relationship status: Not on file  . Intimate partner violence    Fear of current or ex partner: Not on file    Emotionally abused: Not on file    Physically abused: Not on file    Forced sexual activity: Not on file  Other Topics Concern  . Not on file  Social History Narrative  . Not on file   Review of Systems Still has pain in left thigh from injury last month Eating okay Weight is about the same---has started "my diet"    Objective:   Physical Exam  Constitutional: No distress.  Neck: No thyromegaly present.  Cardiovascular: Normal rate, regular rhythm and normal heart sounds. Exam reveals no gallop.  No murmur heard. Respiratory: Effort normal and breath sounds normal. No respiratory distress. She has no wheezes. She has no rales.  Musculoskeletal:     Comments: Pain in right shoulder with passive ROM but fairly full (mild restriction in abduction)  Lymphadenopathy:    She has no cervical adenopathy.  Neurological:  Ongoing right arm > right leg weakness Left leg 4-4+/5  Psychiatric:  Still mild psychomotor retardation Normal appearance and speech though           Assessment & Plan:

## 2018-12-04 NOTE — Patient Instructions (Signed)
Please increase the bedtime gabapentin to 2 capsules (600mg )

## 2018-12-04 NOTE — Assessment & Plan Note (Signed)
Partial remission Still dealing with all the work details, etc

## 2018-12-04 NOTE — Assessment & Plan Note (Addendum)
Hopefully still adequate control Is trying to cut back on carbs Lab Results  Component Value Date   HGBA1C 7.7 (A) 12/04/2018   This is better She is working on improving her diet, etc

## 2018-12-04 NOTE — Assessment & Plan Note (Signed)
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

## 2018-12-04 NOTE — Assessment & Plan Note (Signed)
BP Readings from Last 3 Encounters:  12/04/18 116/78  10/30/18 (!) 142/82  10/26/18 130/76   Good control

## 2018-12-04 NOTE — Assessment & Plan Note (Signed)
Clearly cannot return to work Will need long term disability On ASA, statin

## 2018-12-05 ENCOUNTER — Ambulatory Visit: Payer: Managed Care, Other (non HMO) | Admitting: Physical Therapy

## 2018-12-05 ENCOUNTER — Encounter: Payer: Managed Care, Other (non HMO) | Admitting: Occupational Therapy

## 2018-12-05 ENCOUNTER — Ambulatory Visit: Payer: Managed Care, Other (non HMO)

## 2018-12-05 ENCOUNTER — Ambulatory Visit: Payer: Managed Care, Other (non HMO) | Admitting: Occupational Therapy

## 2018-12-05 DIAGNOSIS — M6281 Muscle weakness (generalized): Secondary | ICD-10-CM

## 2018-12-05 DIAGNOSIS — R2689 Other abnormalities of gait and mobility: Secondary | ICD-10-CM

## 2018-12-05 DIAGNOSIS — R278 Other lack of coordination: Secondary | ICD-10-CM

## 2018-12-05 DIAGNOSIS — M25511 Pain in right shoulder: Secondary | ICD-10-CM

## 2018-12-05 DIAGNOSIS — R2681 Unsteadiness on feet: Secondary | ICD-10-CM

## 2018-12-05 NOTE — Therapy (Signed)
[facility] [address] [place], [place], [address] Phone: [phone]   Fax:  [phone]  Physical Therapy Treatment  Patient Details  Name: Tami Jones MRN: [id number] Date of Birth: 05/26/1955 Referring Provider (PT): Tillman Abideichard Letvak, MD   Encounter Date: 12/05/2018  PT End of Session - 12/05/18 1751    Visit Number  2    Number of Visits  17    Date for PT Re-Evaluation  01/07/19    Authorization Type  [employer]    Authorization - Visit Number  12    Authorization - Number of Visits  60    PT Start Time  1518    PT Stop Time  1559    PT Time Calculation (min)  41 min       Past Medical History:  Diagnosis Date  . Anxiety   . Asthma   . 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 Thad Rangereynolds  . Diabetes mellitus   . GERD (gastroesophageal reflux disease)   . Hyperlipidemia   . Hypertension     Past Surgical History:  Procedure Laterality Date  . CARDIOVASCULAR STRESS TEST  1/15   myoview. EF 60%  . CESAREAN SECTION    . ESOPHAGOGASTRODUODENOSCOPY  05/2005  . TONSILLECTOMY AND ADENOIDECTOMY      There were no vitals filed for this visit.  Subjective Assessment - 12/05/18 1517    Subjective  Patient reports her R arm is hurting, 7/10. Has done some of her Hep but not all of it. Patient reports R leg feels heavy and is a 5-6/10. Hurt left leg when getting up from a commode.    Pertinent History  anxiety, type 2 DM, h/o major depressive disorder, Rt sided sciatica, cerebrovascular disease, SOB    Diagnostic tests  MRI, MRA - MRI revealed acute lunar infarct    Patient Stated Goals  Be able to walk with cane; be able to go shopping, increase endurance    Currently in Pain?  Yes    Pain Score  7     Pain Location  Arm    Pain Orientation  Right    Pain Descriptors / Indicators  Aching    Pain Type  Neuropathic pain    Pain Onset  1 to 4 weeks ago    Pain Frequency  Constant    Pain Score  5    Pain Location  Leg    Pain Orientation  Right    Pain Descriptors / Indicators  Heaviness    Pain Type  Neuropathic pain    Pain Onset  More than a month ago    Pain Frequency  Constant        Patient reports her R arm is hurting, 7/10. Has done some of her Hep but not all of it. Patient reports R leg feels heavy and is a 5-6/10. Hurt left leg when getting up from a commode.     Treatment:   Standing with UE support  -airex pad: BUE support 7" step toe taps 10x each LE -airex pad: marching with BUE support. 10x each LE,  -standing with one foot on airex pad one foot on 7" step 30 seconds each position 30 second holds ; BUE support    Sit to stand with UE support : 10x, cueing for positioning of hands for safety awareness   Seated:  ankle pumps 10x  Tapping orange sticky notes on 7" step 30  seconds x 2 trials for coordination and sequencing.  adduction squeeze 10x 3 second holds  TrA activation 15x; cueing for buttoning a tight pair of pants, exhaling during contraction, inhale during relaxation  single leg hip flexion 15x each LE ER/IR 10x each LE  Pt educated throughout session about proper posture and technique with exercises. Improved exercise technique, movement at target joints, use of target muscles after min to mod verbal, visual, tactile cues.    Patient fatigues quickly throughout session requiring frequent rest breaks, she remains motivated despite fatigue and pain. Coordination and spatial awareness of RLE is limited but improves with repetition and multimodal cueing. Patient would benefit from continued skilled PT to improve strength, balance and gait safety.            PT Education - 12/05/18 1547    Education Details  exercise technique, body mechanics    Person(s) Educated  Patient    Methods  Explanation;Demonstration;Tactile cues;Verbal cues    Comprehension  Verbalized understanding;Returned demonstration;Verbal cues  required;Tactile cues required       PT Short Term Goals - 11/08/18 1111      PT SHORT TERM GOAL #1   Title  Pt will stand 2" unsupported at sink with SBA without LOB for incr. independence and safety with ADL's in standing.    Baseline  8.78 secs without UE support - 11-07-18    Time  4    Period  Weeks    Status  New    Target Date  12/07/18      PT SHORT TERM GOAL #2   Title  Improve Berg score from 13/56 to >/= 23/56 to demo improvement in standing balance.    Baseline  13/56 on 11-07-18    Time  4    Period  Weeks    Status  New    Target Date  12/07/18      PT SHORT TERM GOAL #3   Title  Improve TUG score from 26.13 secs with RW to </= 21 secs with RW for improved functional mobility and reduced fall risk.    Baseline  26.13 secs with RW -11-07-18    Time  4    Period  Weeks    Status  New    Target Date  12/07/18      PT SHORT TERM GOAL #4   Title  Incr. gait velocity from 1.97 ft/sec with RW to >/= 2.3 ft/sec with RW for incr. gait efficiency.    Baseline  16.66 secs with RW = 1.97 ft/sec with RW    Time  4    Period  Weeks    Status  New    Target Date  12/07/18      PT SHORT TERM GOAL #5   Title  Independent in HEP for  balance & strengthening exercises.    Time  4    Period  Weeks    Status  New    Target Date  12/07/18        PT Long Term Goals - 11/08/18 1116      PT LONG TERM GOAL #1   Title  Pt will improve TUG score to >/= 16 secs with RW for reduced fall risk.    Baseline  26.13 secs with RW - 11-07-18    Time  8    Period  Weeks    Status  New    Target Date  01/07/19      PT LONG TERM  GOAL #2   Title  Pt will amb. 300' without device with SBA for increased community accessibility.    Baseline  pt using RW -100' with SBA    Time  8    Period  Weeks    Status  New    Target Date  01/07/19      PT LONG TERM GOAL #3   Title  Modified independent household amb. (50') without device.    Time  8    Period  Weeks    Status  New    Target  Date  01/07/19      PT LONG TERM GOAL #4   Title  Improve Berg score from 13/56 to >/ 36/56 to reduce fall risk.    Baseline  13/56 on 11-07-18    Time  8    Period  Weeks    Status  New    Target Date  01/07/19      PT LONG TERM GOAL #5   Title  Increase gait speed from 1.97 ft/sec with RW to >/= 2.7 ft/sec with RW for incr. gait efficiency.    Baseline  16.66 secs = 1.97 ft/sec with RW on 11-07-18    Time  8    Period  Weeks    Status  New            Plan - 12/05/18 1754    Clinical Impression Statement  Patient fatigues quickly throughout session requiring frequent rest breaks, she remains motivated despite fatigue and pain. Coordination and spatial awareness of RLE is limited but improves with repetition and multimodal cueing. Patient would benefit from continued skilled PT to improve strength, balance and gait safety.    Personal Factors and Comorbidities  Fitness;Comorbidity 2;Transportation;Profession;Behavior Pattern    Comorbidities  Type 2 DM, Rt sided sciatica, h/o major depressive disorder, anxiety, cerebrovascular disease    Examination-Activity Limitations  Bathing;Locomotion Level;Transfers;Reach Overhead;Stairs;Stand;Lift;Squat    Examination-Participation Restrictions  Meal Prep;Cleaning;Community Activity;Driving;Interpersonal Relationship;Laundry;Shop    Stability/Clinical Decision Making  Evolving/Moderate complexity    Rehab Potential  Good    PT Frequency  2x / week    PT Duration  8 weeks    PT Treatment/Interventions  ADLs/Self Care Home Management;Aquatic Therapy;Gait training;Stair training;Neuromuscular re-education;DME Instruction;Balance training;Therapeutic exercise;Therapeutic activities;Patient/family education    PT Next Visit Plan  HEP for balance and RLE strengthening - include sit to stand, standing unsupported (initiated this ex. on 11-07-18 at eval)    PT Home Exercise Plan  Added seated marches, knee extension, and heel/toe raises; see pt  instructions    Consulted and Agree with Plan of Care  Patient       Patient will benefit from skilled therapeutic intervention in order to improve the following deficits and impairments:  Abnormal gait, Decreased activity tolerance, Decreased endurance, Decreased coordination, Decreased balance, Decreased strength, Impaired sensation, Impaired UE functional use, Pain  Visit Diagnosis: Muscle weakness (generalized)  Other lack of coordination  Unsteadiness on feet  Other abnormalities of gait and mobility     Problem List Patient Active Problem List   Diagnosis Date Noted  . Right shoulder pain 12/04/2018  . Neuropathic pain, arm 10/26/2018  . MDD (major depressive disorder), single episode, moderate ([place]) 09/21/2018  . Hemiparesis of right dominant side ([place]) 08/17/2018  . Vestibular dizziness 01/21/2016  . Tick bite 07/15/2015  . Cerebrovascular disease 07/17/2014  . Preventative health care 06/25/2014  . Right sided sciatica 03/08/2013  . MENOPAUSAL SYNDROME 09/10/2008  . Mood disorder ([place])  09/15/2007  . Essential hypertension, benign 08/10/2006  . Type 2 diabetes mellitus with other circulatory complications (HCC) 06/17/2006  . HYPERCHOLESTEROLEMIA 06/17/2006  . GERD 06/17/2006    Precious Bard, PT, DPT   12/05/2018, 5:55 PM  [facility] MAIN [facility] SERVICES [address] [place], [place], [address] Phone: [phone]   Fax:  [phone]  Name: Tami Jones MRN: [id number] Date of Birth: 1955/07/17

## 2018-12-05 NOTE — Therapy (Signed)
[facility] [address] , [place], [address] Phone: [phone]   Fax:  [phone]  Occupational Therapy Treatment  Patient Details  Name: Tami ArbourCynthia B Zale MRN: [id number] Date of Birth: 02/10/1956 Referring Provider (OT): Tillman Abideichard Letvak   Encounter Date: 12/05/2018  OT End of Session - 12/05/18 1501    Visit Number  7    Number of Visits  17    Date for OT Re-Evaluation  01/13/19    Authorization Type  [employer] - 60 VISIT LIMIT OT - 8 used    Authorization Time Period  Progress report period starting 11/14/2018    OT Start Time  1430    OT Stop Time  1515    OT Time Calculation (min)  45 min    Activity Tolerance  Patient limited by pain;Patient tolerated treatment well    Behavior During Therapy  [facility]FL for tasks assessed/performed;Flat affect       Past Medical History:  Diagnosis Date  . Anxiety   . Asthma   . 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 Thad Rangereynolds  . Diabetes mellitus   . GERD (gastroesophageal reflux disease)   . Hyperlipidemia   . Hypertension     Past Surgical History:  Procedure Laterality Date  . CARDIOVASCULAR STRESS TEST  1/15   myoview. EF 60%  . CESAREAN SECTION    . ESOPHAGOGASTRODUODENOSCOPY  05/2005  . TONSILLECTOMY AND ADENOIDECTOMY      There were no vitals filed for this visit.  Subjective Assessment - 12/05/18 1437    Subjective   Pt reports that MD increase her Gabbapentin and is going to see Ortho MD for consult.    Patient is accompanied by:  Family member    Pertinent History  HTN, Hyperlipidemia, DM, anxiety, CVA - 15 years ago with peripheral visual deficit, concussion - 3 years ago    Currently in Pain?  Yes    Pain Score  6     Pain Location  Wrist    Pain Orientation  Right    Pain Descriptors / Indicators  Aching;Constant;Discomfort    Pain Type  Acute pain;Neuropathic pain    Pain Radiating Towards  down to fingers    Pain  Onset  1 to 4 weeks ago    Pain Frequency  Constant    Aggravating Factors   any activity    Pain Relieving Factors  nothing helps    Multiple Pain Sites  Yes    Pain Score  5    Pain Location  Leg    Pain Orientation  Right    Pain Descriptors / Indicators  Heaviness    Pain Type  Neuropathic pain    Pain Radiating Towards  up to back of leg and groin    Pain Onset  More than a month ago    Pain Frequency  Constant    Aggravating Factors   getting up    Pain Relieving Factors  nothing helps    Effect of Pain on Daily Activities  limits amount and how much activity I do now.       OT TREATMENT    Neuro muscular re-education:  Pt. worked on grasping small wooden beads and string on shoe lace with rest breaks needed due to pain in R hand. Pt worked on grasping 1/2" small pegs, and placed them onto and extra small pegboard. Pt. worked on using her hand for storage, as well  as translatory movements of the right hand.   Manual Therapy:  Pt. Tolerated metacarpal spread stretches of the right hand to decreased tightness, and prepare the hand to engage in functional tasks. Also educated in edema massage for edema in R palm and thumb area.   Response to Treatment:  Pt. reports 6/10 pain in her right shoulder. Pt. has had chronic pain in her right shoulder, however it has been aggravated by her grandson pulling it posteriorly last week. Pt. reports increased pain since this happened. Pt. Saw her doctor yesterday and he feels this is neuropathic pain and increased her Gabapentin to 2 each night and ordered Orthopedic consult.  Patient asking if this was needed and discussed why she should do to ensure no injury from her grandson pulling on her arm.  Pt. reports that she has been engaging her RUE in ADL/IADL tasks at home washing dishes, and making her bed. Pt. tolerate moist heat modailty to her right shoulder today while using her right hand during [facility] tasks at the tabletop. Pt. education  was provided about resting her right shoulder, edema massage and avoiding heavy lifting or resitive band exercises. Pt. worked on [facility] skills, and tolerated them well, however fatigues with activity, and reports tightening in her right hand with increase in pain and needed rest breaks and deep pressure to relieve tightness. Pt. tolerated gentle meatcarpal spread stretches of the right hand. Pt. in order to improve ADL, and IADL functioning.                     OT Education - 12/05/18 1442    Education Details  positioning, pain mgt and edema massage    Person(s) Educated  Patient    Methods  Explanation;Demonstration;Verbal cues    Comprehension  Verbalized understanding;Returned demonstration       OT Short Term Goals - 11/07/18 1638      OT SHORT TERM GOAL #1   Title  Patient will complete a home activities program designed on improving functional use of RUE    Time  4    Period  Weeks    Status  New    Target Date  12/14/18      OT SHORT TERM GOAL #2   Title  Patient will demonstrate sufficient balance to stand at counter to utilize BUE functionally for washing/drying dishes without assistance    Time  4    Period  Weeks    Status  New      OT SHORT TERM GOAL #3   Title  Patient will report pain no greater than 4/10 with RUE gentle mobilization  techniques either land or water based    Time  4    Period  Weeks    Status  New      OT SHORT TERM GOAL #4   Title  Patient will show sufficient right hand control and strength to pick up a full glass or mug of liquid and drink while seated.    Time  4    Period  Weeks    Status  New      OT SHORT TERM GOAL #5   Title  Patient will show improved hand/wrist/forearm coordiantion to legibly write a full paragraph (3 sentences) using right hand.    Time  4    Period  Weeks    Status  New        OT Long Term Goals - 11/07/18 1642  OT LONG TERM GOAL #1   Title  Patient will independently complete an HEP  designed to improve/restore RUE range of motion without report of pain    Time  8    Period  Weeks    Status  New    Target Date  01/13/19      OT LONG TERM GOAL #2   Title  Patient will demonstrate sufficient RUE functional grasp and movement as well as balance to replace clean linens on bed, e.g. fitted sheet    Time  8    Period  Weeks    Status  New      OT LONG TERM GOAL #3   Title  Patient will demonstrate sufficient motor control and balance to carry a glass or mug of liquid across 15 foot floor space without spilling    Time  8    Period  Weeks    Status  New      OT LONG TERM GOAL #4   Title  Patient will demonstrate sufficient motor control and strength to utilize bilateral method to cut food as part of meal prep.    Time  8    Period  Weeks    Status  New      OT LONG TERM GOAL #5   Title  Patient will demonstrate understanding of recommendations relating to returning to driving.    Time  8    Period  Weeks    Status  New            Plan - 12/05/18 1501    Clinical Impression Statement  Pt. reports 6/10 pain in her right shoulder. Pt. has had chronic pain in her right shoulder. Pt. reports pain was 9/10 last night and is to start increase in Gabapentin to 2 a night starting tonight and rec she take it about an hour earlier to help decrease pain before she goes to bed.  Practiced edema massage by patient doing it herself since she was very sensitive to touch from therapist and appears to have ore neuropathic pain increase.  Her MD rec orthopedic consult which was also rec by therapist to help rule out and assess issues.  Pt. reports that she has been engaging her RUE in ADL/IADL tasks at home washing dishes, and making her bed. Pt. tolerate moist heat modailty to her right shoulder today initialy but then had increased burning pain in R hand.. Pt. education was provided about resting her right shoulder, and avoiding heavy lifting or resitive band exercises. Pt. worked  on [facility] skills, and tolerated them well, however fatigues with activity, and reports tightening in her right hand. Pt. tolerated gentle meatcarpal spread stretches of the right hand. Pt. in order to improve ADL, and IADL functioning.    OT Occupational Profile and History  Detailed Assessment- Review of Records and additional review of physical, cognitive, psychosocial history related to current functional performance    Occupational performance deficits (Please refer to evaluation for details):  ADL's;IADL's;Leisure;Rest and Sleep;Work    Games developer / Function / Physical Skills  ADL;Coordination;Endurance;GMC;Muscle spasms;UE functional use;Balance;Decreased knowledge of precautions;Sensation;Body mechanics;Decreased knowledge of use of DME;Flexibility;IADL;Pain;Vision;Cardiopulmonary status limiting activity;Dexterity;FMC;Proprioception;Strength;Tone;ROM;Mobility;Edema    Cognitive Skills  Attention;Thought;Emotional;Energy/Drive    Psychosocial Skills  Coping Strategies;Habits;Interpersonal Interaction;Routines and Behaviors    Rehab Potential  Good    Clinical Decision Making  Several treatment options, min-mod task modification necessary    Comorbidities Affecting Occupational Performance:  Presence of comorbidities impacting occupational performance  Comorbidities impacting occupational performance description:  HTN, DM, Hyperlipidemia, Anxiety, (self reported depression), 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    Modification or Assistance to Complete Evaluation   Min-Moderate modification of tasks or assist with assess necessary to complete eval    OT Frequency  2x / week    OT Duration  8 weeks    OT Treatment/Interventions  Self-care/ADL training;Aquatic Therapy;Cryotherapy;Electrical Stimulation;Moist Heat;Ultrasound;Contrast Bath;Fluidtherapy;Paraffin;Therapeutic exercise;Neuromuscular education;Energy conservation;DME and/or AE instruction;Manual Therapy;Functional  Mobility Training;Cognitive remediation/compensation;Therapeutic activities;Splinting;Visual/perceptual remediation/compensation;Patient/family education;Balance training;Psychosocial skills training;Coping strategies training    Recommended Other Services  aquatic therapy    Consulted and Agree with Plan of Care  Patient       Patient will benefit from skilled therapeutic intervention in order to improve the following deficits and impairments:   Body Structure / Function / Physical Skills: ADL, Coordination, Endurance, GMC, Muscle spasms, UE functional use, Balance, Decreased knowledge of precautions, Sensation, Body mechanics, Decreased knowledge of use of DME, Flexibility, IADL, Pain, Vision, Cardiopulmonary status limiting activity, Dexterity, FMC, Proprioception, Strength, Tone, ROM, Mobility, Edema Cognitive Skills: Attention, Thought, Emotional, Energy/Drive Psychosocial Skills: Coping Strategies, Habits, Interpersonal Interaction, Routines and Behaviors   Visit Diagnosis: Muscle weakness (generalized)  Other lack of coordination  Acute pain of right shoulder    Problem List Patient Active Problem List   Diagnosis Date Noted  . Right shoulder pain 12/04/2018  . Neuropathic pain, arm 10/26/2018  . MDD (major depressive disorder), single episode, moderate ([place]) 09/21/2018  . Hemiparesis of right dominant side ([place]) 08/17/2018  . Vestibular dizziness 01/21/2016  . Tick bite 07/15/2015  . Cerebrovascular disease 07/17/2014  . Preventative health care 06/25/2014  . Right sided sciatica 03/08/2013  . MENOPAUSAL SYNDROME 09/10/2008  . Mood disorder ([place]) 09/15/2007  . Essential hypertension, benign 08/10/2006  . Type 2 diabetes mellitus with other circulatory complications ([place]) 47/42/5956  . HYPERCHOLESTEROLEMIA 06/17/2006  . GERD 06/17/2006    Chrys Racer, OTR/L, NTMTC ascom [phone] 12/05/18, 3:15 PM  [place] MAIN [facility]  SERVICES [address] [place], [place], [address] Phone: [phone]   Fax:  [phone]  Name: KARELI HOSSAIN MRN: [id number] Date of Birth: 05/19/1955

## 2018-12-07 ENCOUNTER — Ambulatory Visit: Payer: Managed Care, Other (non HMO) | Admitting: Occupational Therapy

## 2018-12-07 ENCOUNTER — Other Ambulatory Visit: Payer: Self-pay

## 2018-12-07 ENCOUNTER — Ambulatory Visit: Payer: Managed Care, Other (non HMO)

## 2018-12-07 ENCOUNTER — Ambulatory Visit: Payer: Managed Care, Other (non HMO) | Admitting: Physical Therapy

## 2018-12-07 DIAGNOSIS — R2689 Other abnormalities of gait and mobility: Secondary | ICD-10-CM

## 2018-12-07 DIAGNOSIS — R2681 Unsteadiness on feet: Secondary | ICD-10-CM

## 2018-12-07 DIAGNOSIS — M6281 Muscle weakness (generalized): Secondary | ICD-10-CM

## 2018-12-07 DIAGNOSIS — I69351 Hemiplegia and hemiparesis following cerebral infarction affecting right dominant side: Secondary | ICD-10-CM

## 2018-12-07 DIAGNOSIS — R278 Other lack of coordination: Secondary | ICD-10-CM

## 2018-12-07 NOTE — Therapy (Signed)
[facility] MAIN [facility] SERVICES [address] [place], [place], [address] Phone: [phone]   Fax:  [phone]  Occupational Therapy Treatment  Patient Details  Name: Tami Jones MRN: [id number] Date of Birth: 03-13-1955 Referring Provider (OT): Tillman Abide   Encounter Date: 12/07/2018  OT End of Session - 12/07/18 1557    Visit Number  8    Number of Visits  17    Date for OT Re-Evaluation  01/13/19    Authorization Type  [employer] - 60 VISIT LIMIT OT - 8 used    OT Start Time  1545    OT Stop Time  1630    OT Time Calculation (min)  45 min    Activity Tolerance  Patient limited by pain;Patient tolerated treatment well    Behavior During Therapy  [facility] for tasks assessed/performed;Flat affect       Past Medical History:  Diagnosis Date  . Anxiety   . Asthma   . 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 Thad Ranger  . Diabetes mellitus   . GERD (gastroesophageal reflux disease)   . Hyperlipidemia   . Hypertension     Past Surgical History:  Procedure Laterality Date  . CARDIOVASCULAR STRESS TEST  1/15   myoview. EF 60%  . CESAREAN SECTION    . ESOPHAGOGASTRODUODENOSCOPY  05/2005  . TONSILLECTOMY AND ADENOIDECTOMY      There were no vitals filed for this visit.  Subjective Assessment - 12/07/18 1552    Subjective   Pt reports she has her Ortho appt next Wed with Dr Martha Clan at 3:30pm.    Patient is accompanied by:  Family member    Pertinent History  HTN, Hyperlipidemia, DM, anxiety, CVA - 15 years ago with peripheral visual deficit, concussion - 3 years ago    Currently in Pain?  Yes    Pain Score  6     Pain Location  Arm    Pain Orientation  Right    Pain Descriptors / Indicators  Throbbing;Discomfort    Pain Radiating Towards  down to fingers    Pain Onset  1 to 4 weeks ago    Pain Frequency  Constant    Aggravating Factors   any activity and squeezing    Pain Relieving Factors  increase  in Gabapentin seemed to help a little    Effect of Pain on Daily Activities  increase in pain    Multiple Pain Sites  No       OT TREATMENT   Neuro muscular re-education:  Pt. worked on grasping small wooden pins to place on pegboard wiith increased pain when holding her RUE on table so a lower surface was used with no more pain.  Donned sleeve of soft coban to help with pain throughout RUE and hand.  Gave her extra piece to take home with her. Decreased dexterity in R hand today with extra time needed to use 2 point pinch to pick iwooden pins 1/8 inch in size.  Rest breaks x3 for pain today.  Pt worked on using her hand for storage, as well as translatory movements of the right hand.   Manual Therapy:  Pt. Tolerated metacarpal spread stretches of the right hand to decreased tightness, and prepare the hand to engage in functional tasks.Moist heat to RUE tolerated well.  Added red foram to walker handle   Response to Treatment:  Pt. reports 6/10 pain in her right shoulder. Pt. has had  chronic pain in her right shoulder, however it has been aggravated by her grandson pulling it posteriorly last week. Pt. reports increased pain since this happened. Pt. increased her Gabapentin to 2 each night with minimal relief and Orthopedic consult with Dr Mack Guise is Wed Oct 28.  Patient now has pain that radiates from her cervical spine and rec she mention this at Orthopedic appt. Pt. reports that she has been engaging her RUE in ADL/IADL tasks at home washing dishes, and making her bed. Pt. tolerate moist heat modailty to her right shoulder todaywhile using her right hand during [facility] tasks at lower surface. Pt. education was provided about resting her right shoulder, edema massage and avoiding heavy lifting or resitive band exercises. Pt. worked on [facility] skills, and tolerated them well, however fatigues with activity, and reports tightening in her right hand with increase in pain and needed rest breaks and  deep pressure to relieve tightness. Pt. tolerated gentle meatcarpal spread stretches of the right hand. Pt. in order to improve ADL, and IADL functioning.                     OT Education - 12/07/18 1557    Education Details  positioning, pain mgt and edema massage    Person(s) Educated  Patient    Methods  Explanation;Demonstration;Verbal cues    Comprehension  Verbalized understanding;Returned demonstration       OT Short Term Goals - 11/07/18 1638      OT SHORT TERM GOAL #1   Title  Patient will complete a home activities program designed on improving functional use of RUE    Time  4    Period  Weeks    Status  New    Target Date  12/14/18      OT SHORT TERM GOAL #2   Title  Patient will demonstrate sufficient balance to stand at counter to utilize BUE functionally for washing/drying dishes without assistance    Time  4    Period  Weeks    Status  New      OT SHORT TERM GOAL #3   Title  Patient will report pain no greater than 4/10 with RUE gentle mobilization  techniques either land or water based    Time  4    Period  Weeks    Status  New      OT SHORT TERM GOAL #4   Title  Patient will show sufficient right hand control and strength to pick up a full glass or mug of liquid and drink while seated.    Time  4    Period  Weeks    Status  New      OT SHORT TERM GOAL #5   Title  Patient will show improved hand/wrist/forearm coordiantion to legibly write a full paragraph (3 sentences) using right hand.    Time  4    Period  Weeks    Status  New        OT Long Term Goals - 11/07/18 1642      OT LONG TERM GOAL #1   Title  Patient will independently complete an HEP designed to improve/restore RUE range of motion without report of pain    Time  8    Period  Weeks    Status  New    Target Date  01/13/19      OT LONG TERM GOAL #2   Title  Patient will demonstrate sufficient RUE functional grasp  and movement as well as balance to replace clean  linens on bed, e.g. fitted sheet    Time  8    Period  Weeks    Status  New      OT LONG TERM GOAL #3   Title  Patient will demonstrate sufficient motor control and balance to carry a glass or mug of liquid across 15 foot floor space without spilling    Time  8    Period  Weeks    Status  New      OT LONG TERM GOAL #4   Title  Patient will demonstrate sufficient motor control and strength to utilize bilateral method to cut food as part of meal prep.    Time  8    Period  Weeks    Status  New      OT LONG TERM GOAL #5   Title  Patient will demonstrate understanding of recommendations relating to returning to driving.    Time  8    Period  Weeks    Status  New            Plan - 12/07/18 1558    Clinical Impression Statement  Pt. reports 6/10 pain in her right shoulder. Pt. has had chronic pain in her right shoulder and has orthopedic appt next Wed with Dr Martha ClanKrasinski to assess her R shoulder.. Pt. reports pain was 8/10 last night and increased  Gabapentin to 2 a night with mild relief but not very much. Placed a knit sleeve over L arm from elbow to fingers which seemed to help with pain.  Activity had to be placed lower from table since holding her LUE upto table was increasing pain.  Moist heat to neck and R shoulder for 10 minutes.   Pt. education was provided about resting her right shoulder, and avoiding heavy lifting or resitive band exercises. Pt. worked on [facility] skills, and tolerated them well, however fatigues with activity, and reports tightening in her right hand. Pt. tolerated gentle meatcarpal spread stretches of the right hand. Pt. in order to improve ADL, and IADL functioning.    OT Occupational Profile and History  Detailed Assessment- Review of Records and additional review of physical, cognitive, psychosocial history related to current functional performance    Occupational performance deficits (Please refer to evaluation for details):  ADL's;IADL's;Leisure;Rest and  Sleep;Work    Games developerBody Structure / Function / Physical Skills  ADL;Coordination;Endurance;GMC;Muscle spasms;UE functional use;Balance;Decreased knowledge of precautions;Sensation;Body mechanics;Decreased knowledge of use of DME;Flexibility;IADL;Pain;Vision;Cardiopulmonary status limiting activity;Dexterity;FMC;Proprioception;Strength;Tone;ROM;Mobility;Edema    Cognitive Skills  Attention;Thought;Emotional;Energy/Drive    Psychosocial Skills  Coping Strategies;Habits;Interpersonal Interaction;Routines and Behaviors    Rehab Potential  Good    Clinical Decision Making  Several treatment options, min-mod task modification necessary    Comorbidities Affecting Occupational Performance:  Presence of comorbidities impacting occupational performance    Comorbidities impacting occupational performance description:  HTN, DM, Hyperlipidemia, Anxiety, (self reported depression), 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    Modification or Assistance to Complete Evaluation   Min-Moderate modification of tasks or assist with assess necessary to complete eval    OT Frequency  2x / week    OT Duration  8 weeks    OT Treatment/Interventions  Self-care/ADL training;Aquatic Therapy;Cryotherapy;Electrical Stimulation;Moist Heat;Ultrasound;Contrast Bath;Fluidtherapy;Paraffin;Therapeutic exercise;Neuromuscular education;Energy conservation;DME and/or AE instruction;Manual Therapy;Functional Mobility Training;Cognitive remediation/compensation;Therapeutic activities;Splinting;Visual/perceptual remediation/compensation;Patient/family education;Balance training;Psychosocial skills training;Coping strategies training    Recommended Other Services  aquatic therapy    Consulted and Agree with  Plan of Care  Patient       Patient will benefit from skilled therapeutic intervention in order to improve the following deficits and impairments:   Body Structure / Function / Physical Skills: ADL, Coordination, Endurance,  GMC, Muscle spasms, UE functional use, Balance, Decreased knowledge of precautions, Sensation, Body mechanics, Decreased knowledge of use of DME, Flexibility, IADL, Pain, Vision, Cardiopulmonary status limiting activity, Dexterity, FMC, Proprioception, Strength, Tone, ROM, Mobility, Edema Cognitive Skills: Attention, Thought, Emotional, Energy/Drive Psychosocial Skills: Coping Strategies, Habits, Interpersonal Interaction, Routines and Behaviors   Visit Diagnosis: Muscle weakness (generalized)  Other lack of coordination  Hemiplegia and hemiparesis following cerebral infarction affecting right dominant side [facility])    Problem List Patient Active Problem List   Diagnosis Date Noted  . Right shoulder pain 12/04/2018  . Neuropathic pain, arm 10/26/2018  . MDD (major depressive disorder), single episode, moderate (HCC) 09/21/2018  . Hemiparesis of right dominant side (HCC) 08/17/2018  . Vestibular dizziness 01/21/2016  . Tick bite 07/15/2015  . Cerebrovascular disease 07/17/2014  . Preventative health care 06/25/2014  . Right sided sciatica 03/08/2013  . MENOPAUSAL SYNDROME 09/10/2008  . Mood disorder (HCC) 09/15/2007  . Essential hypertension, benign 08/10/2006  . Type 2 diabetes mellitus with other circulatory complications (HCC) 06/17/2006  . HYPERCHOLESTEROLEMIA 06/17/2006  . GERD 06/17/2006    Susanne Borders, OTR/L, NTMTC ascom [phone] 12/07/18, 4:46 PM  [facility] MAIN [facility] SERVICES [address] [place], [place], [address] Phone: [phone]   Fax:  [phone]  Name: Tami Jones MRN: [id number] Date of Birth: [birth year]

## 2018-12-07 NOTE — Therapy (Signed)
[facility] [address] [place], [place], [address] Phone: [phone]   Fax:  [phone]  Physical Therapy Treatment  Patient Details  Name: Tami ArbourCynthia B Jones MRN: [id number] Date of Birth: 07/15/1955 Referring Provider (PT): Tillman Abideichard Letvak, MD   Encounter Date: 12/07/2018  PT End of Session - 12/07/18 1706    Visit Number  3    Number of Visits  17    Date for PT Re-Evaluation  01/07/19    Authorization Type  [employer]    PT Start Time  1645    PT Stop Time  1725    PT Time Calculation (min)  40 min    Equipment Utilized During Treatment  Gait belt    Activity Tolerance  Patient limited by fatigue    Behavior During Therapy  [facility] for tasks assessed/performed;Flat affect       Past Medical History:  Diagnosis Date  . Anxiety   . Asthma   . 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 Thad [place]  . Diabetes mellitus   . GERD (gastroesophageal reflux disease)   . Hyperlipidemia   . Hypertension     Past Surgical History:  Procedure Laterality Date  . CARDIOVASCULAR STRESS TEST  1/15   myoview. EF 60%  . CESAREAN SECTION    . ESOPHAGOGASTRODUODENOSCOPY  05/2005  . TONSILLECTOMY AND ADENOIDECTOMY      There were no vitals filed for this visit.  Subjective Assessment - 12/07/18 1645    Subjective  Patient is going to see ortho next week (wednesday). No stumbles or falls since last session.    Pertinent History  anxiety, type 2 DM, h/o major depressive disorder, Rt sided sciatica, cerebrovascular disease, SOB    Diagnostic tests  MRI, MRA - MRI revealed acute lunar infarct    Patient Stated Goals  Be able to walk with cane; be able to go shopping, increase endurance    Currently in Pain?  Yes    Pain Score  6     Pain Location  Arm    Pain Orientation  Right    Pain Descriptors / Indicators  Throbbing    Pain Type  Neuropathic pain    Pain Onset  1 to 4 weeks ago    Pain Frequency   Constant    Pain Score  6    Pain Location  Leg    Pain Orientation  Right    Pain Descriptors / Indicators  Heaviness    Pain Type  Neuropathic pain    Pain Onset  More than a month ago    Pain Frequency  Constant        Treatment:   Standing with UE support  -airex pad, SUE support frequent lateral sway, poor ability to find midline 60 seconds.   -airex pad: BUE support 4" step toe taps 10x each LE  BUE support 4" step lateral toe taps 10x each LE; min A for support to stabilizing LE   side stepping in // bars 2x length of // bars. Min A for stabilization of weight accepting LE   Sit to stand with UE support : 10x, cueing for positioning of hands for safety awareness    Ambulate 120 ft with RW and CGA, cueing for upright posture, weight acceptance and safety awareness.   Seated:  ankle pumps 10x   TrA activation 15x; cueing for buttoning a tight pair of pants, exhaling during contraction, inhale during relaxation  single leg hip flexion 10x each LE   Pt educated throughout session about proper posture and technique with exercises. Improved exercise technique, movement at target joints, use of target muscles after min to mod verbal, visual, tactile cues.                            PT Education - 12/07/18 1638    Education Details  exercise technique, body mechanics    Person(s) Educated  Patient    Methods  Explanation;Demonstration;Tactile cues;Verbal cues    Comprehension  Verbalized understanding;Returned demonstration;Tactile cues required;Verbal cues required       PT Short Term Goals - 11/08/18 1111      PT SHORT TERM GOAL #1   Title  Pt will stand 2" unsupported at sink with SBA without LOB for incr. independence and safety with ADL's in standing.    Baseline  8.78 secs without UE support - 11-07-18    Time  4    Period  Weeks    Status  New    Target Date  12/07/18      PT SHORT TERM GOAL #2   Title  Improve Berg score from 13/56  to >/= 23/56 to demo improvement in standing balance.    Baseline  13/56 on 11-07-18    Time  4    Period  Weeks    Status  New    Target Date  12/07/18      PT SHORT TERM GOAL #3   Title  Improve TUG score from 26.13 secs with RW to </= 21 secs with RW for improved functional mobility and reduced fall risk.    Baseline  26.13 secs with RW -11-07-18    Time  4    Period  Weeks    Status  New    Target Date  12/07/18      PT SHORT TERM GOAL #4   Title  Incr. gait velocity from 1.97 ft/sec with RW to >/= 2.3 ft/sec with RW for incr. gait efficiency.    Baseline  16.66 secs with RW = 1.97 ft/sec with RW    Time  4    Period  Weeks    Status  New    Target Date  12/07/18      PT SHORT TERM GOAL #5   Title  Independent in HEP for  balance & strengthening exercises.    Time  4    Period  Weeks    Status  New    Target Date  12/07/18        PT Long Term Goals - 11/08/18 1116      PT LONG TERM GOAL #1   Title  Pt will improve TUG score to >/= 16 secs with RW for reduced fall risk.    Baseline  26.13 secs with RW - 11-07-18    Time  8    Period  Weeks    Status  New    Target Date  01/07/19      PT LONG TERM GOAL #2   Title  Pt will amb. 300' without device with SBA for increased community accessibility.    Baseline  pt using RW -100' with SBA    Time  8    Period  Weeks    Status  New    Target Date  01/07/19      PT LONG TERM GOAL #3   Title  Modified  independent household amb. (50') without device.    Time  8    Period  Weeks    Status  New    Target Date  01/07/19      PT LONG TERM GOAL #4   Title  Improve Berg score from 13/56 to >/ 36/56 to reduce fall risk.    Baseline  13/56 on 11-07-18    Time  8    Period  Weeks    Status  New    Target Date  01/07/19      PT LONG TERM GOAL #5   Title  Increase gait speed from 1.97 ft/sec with RW to >/= 2.7 ft/sec with RW for incr. gait efficiency.    Baseline  16.66 secs = 1.97 ft/sec with RW on 11-07-18    Time  8     Period  Weeks    Status  New            Plan - 12/07/18 1711    Clinical Impression Statement  Patient presents with increased fatigue this session requiring more frequent seated rest breaks between standing interventions.  Patient demonstrates excellent motivation and continues to challenge self despite fatigue. Coordination and spatial awareness of RLE is limited but improves with repetition and multimodal cueing. Patient would benefit from continued skilled PT to improve strength, balance and gait safety.    Personal Factors and Comorbidities  Fitness;Comorbidity 2;Transportation;Profession;Behavior Pattern    Comorbidities  Type 2 DM, Rt sided sciatica, h/o major depressive disorder, anxiety, cerebrovascular disease    Examination-Activity Limitations  Bathing;Locomotion Level;Transfers;Reach Overhead;Stairs;Stand;Lift;Squat    Examination-Participation Restrictions  Meal Prep;Cleaning;Community Activity;Driving;Interpersonal Relationship;Laundry;Shop    Stability/Clinical Decision Making  Evolving/Moderate complexity    Rehab Potential  Good    PT Frequency  2x / week    PT Duration  8 weeks    PT Treatment/Interventions  ADLs/Self Care Home Management;Aquatic Therapy;Gait training;Stair training;Neuromuscular re-education;DME Instruction;Balance training;Therapeutic exercise;Therapeutic activities;Patient/family education    PT Next Visit Plan  HEP for balance and RLE strengthening - include sit to stand, standing unsupported (initiated this ex. on 11-07-18 at eval)    PT Home Exercise Plan  Added seated marches, knee extension, and heel/toe raises; see pt instructions    Consulted and Agree with Plan of Care  Patient       Patient will benefit from skilled therapeutic intervention in order to improve the following deficits and impairments:  Abnormal gait, Decreased activity tolerance, Decreased endurance, Decreased coordination, Decreased balance, Decreased strength, Impaired  sensation, Impaired UE functional use, Pain  Visit Diagnosis: Muscle weakness (generalized)  Other lack of coordination  Unsteadiness on feet  Other abnormalities of gait and mobility     Problem List Patient Active Problem List   Diagnosis Date Noted  . Right shoulder pain 12/04/2018  . Neuropathic pain, arm 10/26/2018  . MDD (major depressive disorder), single episode, moderate (HCC) 09/21/2018  . Hemiparesis of right dominant side (HCC) 08/17/2018  . Vestibular dizziness 01/21/2016  . Tick bite 07/15/2015  . Cerebrovascular disease 07/17/2014  . Preventative health care 06/25/2014  . Right sided sciatica 03/08/2013  . MENOPAUSAL SYNDROME 09/10/2008  . Mood disorder (HCC) 09/15/2007  . Essential hypertension, benign 08/10/2006  . Type 2 diabetes mellitus with other circulatory complications (HCC) 06/17/2006  . HYPERCHOLESTEROLEMIA 06/17/2006  . GERD 06/17/2006   Precious Bard, PT, DPT   12/07/2018, 5:26 PM  [facility] MAIN [facility] SERVICES [address] [place], [place], [address] Phone: [phone]  Fax:  [phone]  Name: Tami Jones MRN: [id number] Date of Birth: Feb 05, 1956

## 2018-12-12 ENCOUNTER — Other Ambulatory Visit: Payer: Self-pay

## 2018-12-12 ENCOUNTER — Ambulatory Visit: Payer: Managed Care, Other (non HMO) | Admitting: Occupational Therapy

## 2018-12-12 ENCOUNTER — Encounter: Payer: Self-pay | Admitting: Physical Therapy

## 2018-12-12 ENCOUNTER — Ambulatory Visit: Payer: Managed Care, Other (non HMO) | Admitting: Physical Therapy

## 2018-12-12 ENCOUNTER — Encounter: Payer: Self-pay | Admitting: Occupational Therapy

## 2018-12-12 ENCOUNTER — Encounter: Payer: Managed Care, Other (non HMO) | Admitting: Occupational Therapy

## 2018-12-12 DIAGNOSIS — R278 Other lack of coordination: Secondary | ICD-10-CM

## 2018-12-12 DIAGNOSIS — M6281 Muscle weakness (generalized): Secondary | ICD-10-CM | POA: Diagnosis not present

## 2018-12-12 DIAGNOSIS — R2689 Other abnormalities of gait and mobility: Secondary | ICD-10-CM

## 2018-12-12 NOTE — Therapy (Signed)
[facility] MAIN [facility] SERVICES [address] [place], [place], [address] Phone: [phone]   Fax:  [phone]  Occupational Therapy Treatment  Patient Details  Name: Tami ArbourCynthia B Biederman MRN: [id number] Date of Birth: 09/24/1955 Referring Provider (OT): Tillman Abideichard Letvak   Encounter Date: 12/12/2018  OT End of Session - 12/12/18 1556    Visit Number  9    Number of Visits  17    Date for OT Re-Evaluation  01/13/19    Authorization Type  [employer] - 60 VISIT LIMIT OT - 8 used    Authorization Time Period  Progress report period starting 11/14/2018    Authorization - Number of Visits  52    OT Start Time  1500    OT Stop Time  1545    OT Time Calculation (min)  45 min    Activity Tolerance  Patient limited by pain;Patient tolerated treatment well    Behavior During Therapy  [facility] for tasks assessed/performed;Flat affect       Past Medical History:  Diagnosis Date  . Anxiety   . Asthma   . 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 Thad Rangereynolds  . Diabetes mellitus   . GERD (gastroesophageal reflux disease)   . Hyperlipidemia   . Hypertension     Past Surgical History:  Procedure Laterality Date  . CARDIOVASCULAR STRESS TEST  1/15   myoview. EF 60%  . CESAREAN SECTION    . ESOPHAGOGASTRODUODENOSCOPY  05/2005  . TONSILLECTOMY AND ADENOIDECTOMY      There were no vitals filed for this visit.  Subjective Assessment - 12/12/18 1552    Subjective   Pt. reports [facility]    Patient is accompanied by:  Family member    Pertinent History  HTN, Hyperlipidemia, DM, anxiety, CVA - 15 years ago with peripheral visual deficit, concussion - 3 years ago    Currently in Pain?  Yes    Pain Score  8     Pain Location  Arm    Pain Orientation  Right    Pain Descriptors / Indicators  Aching;Constant    Pain Type  Neuropathic pain;Acute pain    Pain Radiating Towards  radiating down to  the hand, and digits.      OT TREATMENT    Neuro muscular re-education:  Pt. worked on [facility] skills grasping 1" sticks sliding them off the surface of the board with her 2nd digit to her thumb in preparation for placing them onto the board.  Pt. Required increased time cues, and cues to focus on the quality of movement.  Therapeutic Exercise:  Pt. performed AROM for supination, pronation, wrist flexion, extension, and intrinsic hand stretches.  Response to Treatment:  Pt. reports 8/10 pain in her right shoulder. Pt. reports worsening pain after her grandson yanked her arm posteriorly approximately 1-2 weeks ago. Pt. reports that she was tearful earlier this a.m. secondary to the pain. RUE pain does limit her ability to engage her hand during functional tasks. Pt. education was provided about positioning of her right arm, and posiitoning of tasks. Pt. continues to work on improving UE ROM, and [facility] skills in order to improve LUE functioning during ADLs, and IADL tasks.                            OT Education - 12/12/18 1556    Education Details  positioning,  [facility]    Person(s) Educated  Patient    Methods  Explanation;Demonstration;Verbal cues    Comprehension  Verbalized understanding;Returned demonstration       OT Short Term Goals - 11/07/18 1638      OT SHORT TERM GOAL #1   Title  Patient will complete a home activities program designed on improving functional use of RUE    Time  4    Period  Weeks    Status  New    Target Date  12/14/18      OT SHORT TERM GOAL #2   Title  Patient will demonstrate sufficient balance to stand at counter to utilize BUE functionally for washing/drying dishes without assistance    Time  4    Period  Weeks    Status  New      OT SHORT TERM GOAL #3   Title  Patient will report pain no greater than 4/10 with RUE gentle mobilization  techniques either land or water based    Time  4    Period  Weeks    Status  New      OT  SHORT TERM GOAL #4   Title  Patient will show sufficient right hand control and strength to pick up a full glass or mug of liquid and drink while seated.    Time  4    Period  Weeks    Status  New      OT SHORT TERM GOAL #5   Title  Patient will show improved hand/wrist/forearm coordiantion to legibly write a full paragraph (3 sentences) using right hand.    Time  4    Period  Weeks    Status  New        OT Long Term Goals - 11/07/18 1642      OT LONG TERM GOAL #1   Title  Patient will independently complete an HEP designed to improve/restore RUE range of motion without report of pain    Time  8    Period  Weeks    Status  New    Target Date  01/13/19      OT LONG TERM GOAL #2   Title  Patient will demonstrate sufficient RUE functional grasp and movement as well as balance to replace clean linens on bed, e.g. fitted sheet    Time  8    Period  Weeks    Status  New      OT LONG TERM GOAL #3   Title  Patient will demonstrate sufficient motor control and balance to carry a glass or mug of liquid across 15 foot floor space without spilling    Time  8    Period  Weeks    Status  New      OT LONG TERM GOAL #4   Title  Patient will demonstrate sufficient motor control and strength to utilize bilateral method to cut food as part of meal prep.    Time  8    Period  Weeks    Status  New      OT LONG TERM GOAL #5   Title  Patient will demonstrate understanding of recommendations relating to returning to driving.    Time  8    Period  Weeks    Status  New            Plan - 12/12/18 1557    Clinical Impression Statement  Pt. reports 8/10 pain in her right shoulder. Pt.  reports worsening pain after her grandson yanked her arm posteriorly approximately 1-2 weeks ago. Pt. reports that she was tearful earlier this a.m. secondary to the pain. RUE pain does limit her ability to engage her hand during functional tasks. Pt. education was provided about positioning of her right arm,  and posiitoning of tasks. Pt. continues to work on improving UE ROM, and [facility] skills in order to improve LUE functioning during ADLs, and IADL tasks.    OT Occupational Profile and History  Detailed Assessment- Review of Records and additional review of physical, cognitive, psychosocial history related to current functional performance    Occupational performance deficits (Please refer to evaluation for details):  ADL's;IADL's;Leisure;Rest and Sleep;Work    Games developer / Function / Physical Skills  ADL;Coordination;Endurance;GMC;Muscle spasms;UE functional use;Balance;Decreased knowledge of precautions;Sensation;Body mechanics;Decreased knowledge of use of DME;Flexibility;IADL;Pain;Vision;Cardiopulmonary status limiting activity;Dexterity;FMC;Proprioception;Strength;Tone;ROM;Mobility;Edema    Cognitive Skills  Attention;Thought;Emotional;Energy/Drive    Psychosocial Skills  Coping Strategies;Habits;Interpersonal Interaction;Routines and Behaviors    Rehab Potential  Good    Clinical Decision Making  Several treatment options, min-mod task modification necessary    Comorbidities Affecting Occupational Performance:  Presence of comorbidities impacting occupational performance    Modification or Assistance to Complete Evaluation   Min-Moderate modification of tasks or assist with assess necessary to complete eval    OT Frequency  2x / week    OT Duration  8 weeks    OT Treatment/Interventions  Self-care/ADL training;Aquatic Therapy;Cryotherapy;Electrical Stimulation;Moist Heat;Ultrasound;Contrast Bath;Fluidtherapy;Paraffin;Therapeutic exercise;Neuromuscular education;Energy conservation;DME and/or AE instruction;Manual Therapy;Functional Mobility Training;Cognitive remediation/compensation;Therapeutic activities;Splinting;Visual/perceptual remediation/compensation;Patient/family education;Balance training;Psychosocial skills training;Coping strategies training    Consulted and Agree with Plan of Care   Patient       Patient will benefit from skilled therapeutic intervention in order to improve the following deficits and impairments:   Body Structure / Function / Physical Skills: ADL, Coordination, Endurance, GMC, Muscle spasms, UE functional use, Balance, Decreased knowledge of precautions, Sensation, Body mechanics, Decreased knowledge of use of DME, Flexibility, IADL, Pain, Vision, Cardiopulmonary status limiting activity, Dexterity, FMC, Proprioception, Strength, Tone, ROM, Mobility, Edema Cognitive Skills: Attention, Thought, Emotional, Energy/Drive Psychosocial Skills: Coping Strategies, Habits, Interpersonal Interaction, Routines and Behaviors   Visit Diagnosis: Muscle weakness (generalized)  Other lack of coordination    Problem List Patient Active Problem List   Diagnosis Date Noted  . Right shoulder pain 12/04/2018  . Neuropathic pain, arm 10/26/2018  . MDD (major depressive disorder), single episode, moderate (HCC) 09/21/2018  . Hemiparesis of right dominant side (HCC) 08/17/2018  . Vestibular dizziness 01/21/2016  . Tick bite 07/15/2015  . Cerebrovascular disease 07/17/2014  . Preventative health care 06/25/2014  . Right sided sciatica 03/08/2013  . MENOPAUSAL SYNDROME 09/10/2008  . Mood disorder (HCC) 09/15/2007  . Essential hypertension, benign 08/10/2006  . Type 2 diabetes mellitus with other circulatory complications (HCC) 06/17/2006  . HYPERCHOLESTEROLEMIA 06/17/2006  . GERD 06/17/2006    Olegario Messier, MS, OTR/L 12/12/2018, 4:15 PM  [place] [facility] MAIN [facility] SERVICES [address] [place], [place], [address] Phone: [phone]   Fax:  [phone]  Name: TENNA LACKO MRN: [id number] Date of Birth: [birth year]

## 2018-12-12 NOTE — Therapy (Signed)
[place] [facility] MAIN [facility] SERVICES [address] [place], [place], [address] Phone: [phone]   Fax:  [phone]  Physical Therapy Treatment  Patient Details  Name: Tami Jones MRN: [id number] Date of Birth: 1955-09-30 Referring Provider (PT): Tillman Abide, MD   Encounter Date: 12/12/2018  PT End of Session - 12/12/18 1549    Visit Number  4    Number of Visits  17    Date for PT Re-Evaluation  01/07/19    Authorization Type  [employer]    PT Start Time  1600    PT Stop Time  1645    PT Time Calculation (min)  45 min    Equipment Utilized During Treatment  Gait belt    Activity Tolerance  Patient limited by fatigue    Behavior During Therapy  [facility] for tasks assessed/performed;Flat affect       Past Medical History:  Diagnosis Date  . Anxiety   . Asthma   . 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 Thad Ranger  . Diabetes mellitus   . GERD (gastroesophageal reflux disease)   . Hyperlipidemia   . Hypertension     Past Surgical History:  Procedure Laterality Date  . CARDIOVASCULAR STRESS TEST  1/15   myoview. EF 60%  . CESAREAN SECTION    . ESOPHAGOGASTRODUODENOSCOPY  05/2005  . TONSILLECTOMY AND ADENOIDECTOMY      There were no vitals filed for this visit.    This entire session was performed under direct supervision and direction of a licensed therapist/therapist assistant . I have personally read, edited and approve of the note as written.  Treatment:   Standing with UE support  -airex pad x1 min, SUE support; frequent lateral sway and unable to maintain midline for >4 sec.    -airex pad: BUE support 4" step toe taps 10x each LE, followed by x10 toe taps while looking at mirror for postural correction to minimize lateral sway.   Side stepping in // bars 2x length of // bars. Min A for stabilization of weight accepting LE; cues to pick up feet, avoid sliding across ground.   Side step over cane  x5 each direction with BUE support, cues to take large enough steps for foot clearance and provide enough room for contralateral foot without stepping on cane.   Sit to stand with UE support: x10, cueing for positioning of hands for safety awareness, push from armrests instead of pulling from bars/walker.    Ambulate 2x69ft with RW and CGA, cueing for upright posture, weight acceptance and safety awareness. min VC to take slightly larger steps with RLE.    Seated:  TrA activation 15x; cueing for buttoning a tight pair of pants, exhaling during contraction, inhale during relaxation    ankle pumps 10x BLE    single leg hip flexion 10x BLE  Single leg knee extension x10 BLE      Pt educated throughout session about proper posture and technique with exercises. Improved exercise technique, movement at target joints, use of target muscles after min to mod verbal, visual, tactile cues.      Patient demonstrates excellent motivation throughout today's session. Patient was able to reproduce the qualitative aspects of balance interventions introduced last session, required min verbal and visual cues in mirror to maintain upright posture. Patient continues to be challenged by fatigue, balance, and gait ability, which improved with frequent rest breaks, repetition and multimodal cuing. Patient would continue to benefit from  skilled PT to address the deficits outlined in this note.       PT Education - 12/12/18 1548    Education Details  exercise technique, body mechanics    Person(s) Educated  Patient    Methods  Explanation;Demonstration;Tactile cues;Verbal cues    Comprehension  Verbalized understanding;Returned demonstration;Verbal cues required;Tactile cues required       PT Short Term Goals - 11/08/18 1111      PT SHORT TERM GOAL #1   Title  Pt will stand 2" unsupported at sink with SBA without LOB for incr. independence and safety with ADL's in standing.    Baseline  8.78 secs without  UE support - 11-07-18    Time  4    Period  Weeks    Status  New    Target Date  12/07/18      PT SHORT TERM GOAL #2   Title  Improve Berg score from 13/56 to >/= 23/56 to demo improvement in standing balance.    Baseline  13/56 on 11-07-18    Time  4    Period  Weeks    Status  New    Target Date  12/07/18      PT SHORT TERM GOAL #3   Title  Improve TUG score from 26.13 secs with RW to </= 21 secs with RW for improved functional mobility and reduced fall risk.    Baseline  26.13 secs with RW -11-07-18    Time  4    Period  Weeks    Status  New    Target Date  12/07/18      PT SHORT TERM GOAL #4   Title  Incr. gait velocity from 1.97 ft/sec with RW to >/= 2.3 ft/sec with RW for incr. gait efficiency.    Baseline  16.66 secs with RW = 1.97 ft/sec with RW    Time  4    Period  Weeks    Status  New    Target Date  12/07/18      PT SHORT TERM GOAL #5   Title  Independent in HEP for  balance & strengthening exercises.    Time  4    Period  Weeks    Status  New    Target Date  12/07/18        PT Long Term Goals - 11/08/18 1116      PT LONG TERM GOAL #1   Title  Pt will improve TUG score to >/= 16 secs with RW for reduced fall risk.    Baseline  26.13 secs with RW - 11-07-18    Time  8    Period  Weeks    Status  New    Target Date  01/07/19      PT LONG TERM GOAL #2   Title  Pt will amb. 300' without device with SBA for increased community accessibility.    Baseline  pt using RW -100' with SBA    Time  8    Period  Weeks    Status  New    Target Date  01/07/19      PT LONG TERM GOAL #3   Title  Modified independent household amb. (50') without device.    Time  8    Period  Weeks    Status  New    Target Date  01/07/19      PT LONG TERM GOAL #4   Title  Improve Berg score from 13/56  to >/ 36/56 to reduce fall risk.    Baseline  13/56 on 11-07-18    Time  8    Period  Weeks    Status  New    Target Date  01/07/19      PT LONG TERM GOAL #5   Title  Increase  gait speed from 1.97 ft/sec with RW to >/= 2.7 ft/sec with RW for incr. gait efficiency.    Baseline  16.66 secs = 1.97 ft/sec with RW on 11-07-18    Time  8    Period  Weeks    Status  New              Patient will benefit from skilled therapeutic intervention in order to improve the following deficits and impairments:     Visit Diagnosis: Muscle weakness (generalized)  Other lack of coordination  Other abnormalities of gait and mobility     Problem List Patient Active Problem List   Diagnosis Date Noted  . Right shoulder pain 12/04/2018  . Neuropathic pain, arm 10/26/2018  . MDD (major depressive disorder), single episode, moderate ([place]) 09/21/2018  . Hemiparesis of right dominant side ([place]) 08/17/2018  . Vestibular dizziness 01/21/2016  . Tick bite 07/15/2015  . Cerebrovascular disease 07/17/2014  . Preventative health care 06/25/2014  . Right sided sciatica 03/08/2013  . MENOPAUSAL SYNDROME 09/10/2008  . Mood disorder ([place]) 09/15/2007  . Essential hypertension, benign 08/10/2006  . Type 2 diabetes mellitus with other circulatory complications ([place]) 49/17/9150  . HYPERCHOLESTEROLEMIA 06/17/2006  . GERD 06/17/2006   Tami Jones, Tami Jones  Alean Rinne 12/12/2018, 3:50 PM Alanson Puls, PT, DPT [place] MAIN [facility] SERVICES [address] [place], [place], [address] Phone: [phone]   Fax:  [phone]  Name: Tami Jones MRN: [id number] Date of Birth: 04-06-1955

## 2018-12-14 ENCOUNTER — Ambulatory Visit: Payer: Managed Care, Other (non HMO) | Admitting: Physical Therapy

## 2018-12-14 ENCOUNTER — Ambulatory Visit: Payer: Managed Care, Other (non HMO) | Admitting: Occupational Therapy

## 2018-12-19 ENCOUNTER — Ambulatory Visit: Payer: Managed Care, Other (non HMO)

## 2018-12-19 ENCOUNTER — Encounter: Payer: Managed Care, Other (non HMO) | Admitting: Occupational Therapy

## 2018-12-19 ENCOUNTER — Ambulatory Visit: Payer: Managed Care, Other (non HMO) | Admitting: Physical Therapy

## 2018-12-19 ENCOUNTER — Ambulatory Visit: Payer: Managed Care, Other (non HMO) | Admitting: Occupational Therapy

## 2018-12-20 ENCOUNTER — Ambulatory Visit: Payer: Managed Care, Other (non HMO) | Attending: Internal Medicine | Admitting: Occupational Therapy

## 2018-12-20 ENCOUNTER — Other Ambulatory Visit: Payer: Self-pay

## 2018-12-20 ENCOUNTER — Encounter: Payer: Self-pay | Admitting: Occupational Therapy

## 2018-12-20 DIAGNOSIS — R2689 Other abnormalities of gait and mobility: Secondary | ICD-10-CM | POA: Diagnosis present

## 2018-12-20 DIAGNOSIS — I69351 Hemiplegia and hemiparesis following cerebral infarction affecting right dominant side: Secondary | ICD-10-CM | POA: Insufficient documentation

## 2018-12-20 DIAGNOSIS — R2681 Unsteadiness on feet: Secondary | ICD-10-CM | POA: Diagnosis present

## 2018-12-20 DIAGNOSIS — R278 Other lack of coordination: Secondary | ICD-10-CM | POA: Insufficient documentation

## 2018-12-20 DIAGNOSIS — M25511 Pain in right shoulder: Secondary | ICD-10-CM | POA: Diagnosis present

## 2018-12-20 DIAGNOSIS — R209 Unspecified disturbances of skin sensation: Secondary | ICD-10-CM | POA: Diagnosis present

## 2018-12-20 DIAGNOSIS — M6281 Muscle weakness (generalized): Secondary | ICD-10-CM | POA: Insufficient documentation

## 2018-12-20 DIAGNOSIS — R41840 Attention and concentration deficit: Secondary | ICD-10-CM | POA: Insufficient documentation

## 2018-12-20 NOTE — Therapy (Signed)
[place] MAIN [facility] SERVICES [address] [place], [place], [address] Phone: [phone]   Fax:  [phone]  Occupational Therapy Progress Note  Dates of reporting period  11/14/2018   to   12/20/2018  Patient Details  Name: Tami Jones MRN: [id number] Date of Birth: 1955-12-25 Referring Provider (OT): Viviana Simpler   Encounter Date: 12/20/2018  OT End of Session - 12/20/18 1502    Visit Number  10    Date for OT Re-Evaluation  01/13/19    Authorization Type  [employer] - 60 VISIT LIMIT OT - 8 used    Authorization Time Period  Progress report period starting 11/14/2018    OT Start Time  1430    OT Stop Time  1515    OT Time Calculation (min)  45 min    Activity Tolerance  Patient limited by pain;Patient tolerated treatment well    Behavior During Therapy  [facility] for tasks assessed/performed;Flat affect       Past Medical History:  Diagnosis Date  . Anxiety   . Asthma   . CVA (cerebral vascular accident) ([place]) 03/2006   right occipital, Dr. Doy Mince  . Diabetes mellitus   . GERD (gastroesophageal reflux disease)   . Hyperlipidemia   . Hypertension     Past Surgical History:  Procedure Laterality Date  . CARDIOVASCULAR STRESS TEST  1/15   myoview. EF 60%  . CESAREAN SECTION    . ESOPHAGOGASTRODUODENOSCOPY  05/2005  . TONSILLECTOMY AND ADENOIDECTOMY      There were no vitals filed for this visit.  Subjective Assessment - 12/20/18 1500    Subjective   Pt. report having had a stomach bug last week    Patient is accompanied by:  Family member    Pertinent History  HTN, Hyperlipidemia, DM, anxiety, CVA - 15 years ago with peripheral visual deficit, concussion - 3 years ago    Currently in Pain?  Yes    Pain Score  6    shoulder   Pain Location  Elbow    Pain Orientation  Right    Pain Descriptors / Indicators  Aching    Pain Type  Acute pain      OT TREATMENT    Therapeutic Exercise:  Pt. Tolerated AAROM for right  shoulder flexion, abduction, AROM elbow flexion, extension, forearm supination, and wrist extension. Reviewed intrinsic stretches of the hand.   Response to Treatment:  Pt. reports that she has decided not to go to the orthopedic appointment after having to cancel due to illness last week.Pt. reports 6/10 pain in the right elbow, and shoulder. Pt. continues to be limited by pain, however reports that it is better at night now that she is taking Gabapetin at bedtime as pt. reports that it makes her drowsy. Pt. reports that she continues to have pain during the day. Pt. was able to tolerate ROM to the RUE well today without reports of increased pain. Pt. continues to work towards goals to improve, and increase UE engagement during ADLs, and IADLs                        OT Education - 12/20/18 1501    Education Details  positioning, [facility]    Person(s) Educated  Patient    Methods  Explanation;Demonstration;Verbal cues    Comprehension  Verbalized understanding;Returned demonstration       OT Short Term Goals - 12/20/18 1505  OT SHORT TERM GOAL #1   Title  Patient will complete a home activities program designed on improving functional use of RUE    Time  4    Period  Weeks    Status  Achieved      OT SHORT TERM GOAL #2   Title  Patient will demonstrate sufficient balance to stand at counter to utilize BUE functionally for washing/drying dishes without assistance    Baseline  Pt. uses forearm support at the countertop, and the walker. Pt. was able to wash dishes, however is unable to do pots, and pans.    Time  4    Period  Weeks    Status  Partially Met    Target Date  01/13/19      OT SHORT TERM GOAL #3   Title  Patient will report pain no greater than 4/10 with RUE gentle mobilization  techniques either land or water based    Baseline  6/10. Improving duirng the nighttime after tking her Gabepetin.    Time  4    Period  Weeks    Status  Partially Met     Target Date  01/14/19      OT SHORT TERM GOAL #4   Title  Patient will show sufficient right hand control and strength to pick up a full glass or mug of liquid and drink while seated.    Baseline  Pt. is able to hold a lightweight mug, or glass    Time  4    Period  Weeks    Status  Partially Met    Target Date  01/14/19      OT SHORT TERM GOAL #5   Title  Patient will show improved hand/wrist/forearm coordiantion to legibly write a full paragraph (3 sentences) using right hand.    Baseline  Pt. is making progress, howeevr continues to work on improvied legibility    Time  4    Period  Weeks    Status  On-going    Target Date  01/14/19        OT Long Term Goals - 12/20/18 1513      OT [place] #1   Title  Patient will independently complete an HEP designed to improve/restore RUE range of motion without report of pain    Baseline  Pt. conitnues to work on [employer].    Time  8    Period  Weeks    Status  On-going    Target Date  01/13/19      OT LONG TERM GOAL #2   Title  Patient will demonstrate sufficient RUE functional grasp and movement as well as balance to replace clean linens on bed, e.g. fitted sheet    Baseline  Pt. is able to pick up, and pull the end sheet. Pt. is unable to hold the ftted sheet, or the pillowcases.    Time  0    Period  Weeks    Status  On-going    Target Date  01/13/19      OT LONG TERM GOAL #3   Title  Patient will demonstrate sufficient motor control and balance to carry a glass or mug of liquid across 15 foot floor space without spilling    Baseline  Pt. continues to have difficulty    Time  8    Period  Weeks    Status  On-going    Target Date  01/14/19  OT LONG TERM GOAL #4   Title  Patient will demonstrate sufficient motor control and strength to utilize bilateral method to cut food as part of meal prep.    Baseline  Pt. is unable    Time  8    Period  Weeks    Status  On-going    Target Date  01/14/19      OT  LONG TERM GOAL #5   Title  Patient will demonstrate understanding of recommendations relating to returning to driving.    Baseline  Pt. is currently not driving    Time  8    Period  Weeks    Status  On-going            Plan - 12/20/18 1502    Clinical Impression Statement Pt. reports that she has decided not to go to the orthopedic appointment after having to cancel due to illness last week.Pt. reports 6/10 pain in the right elbow, and shoulder. Pt. continues to be limited by pain, however reports that it is better at night now that she is taking Gabapetin at bedtime as pt. reports that it makes her drowsy. Pt. reports that she continues to have pain during the day. Pt. was able to tolerate ROM to the RUE well today without reports of increased pain. Pt. continues to work towards goals to improve, and increase UE engagement during ADLs, and IADLs.    OT Occupational Profile and History  Detailed Assessment- Review of Records and additional review of physical, cognitive, psychosocial history related to current functional performance    Occupational performance deficits (Please refer to evaluation for details):  ADL's;IADL's;Leisure;Rest and Sleep;Work    Marketing executive / Function / Physical Skills  ADL;Coordination;Endurance;GMC;Muscle spasms;UE functional use;Balance;Decreased knowledge of precautions;Sensation;Body mechanics;Decreased knowledge of use of DME;Flexibility;IADL;Pain;Vision;Cardiopulmonary status limiting activity;Dexterity;FMC;Proprioception;Strength;Tone;ROM;Mobility;Edema    Cognitive Skills  Attention;Thought;Emotional;Energy/Drive    Psychosocial Skills  Coping Strategies;Habits;Interpersonal Interaction;Routines and Behaviors    Rehab Potential  Good    Clinical Decision Making  Several treatment options, min-mod task modification necessary    Comorbidities Affecting Occupational Performance:  Presence of comorbidities impacting occupational performance    Comorbidities  impacting occupational performance description:  HTN, DM, Hyperlipidemia, Anxiety, (self reported depression), 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    Modification or Assistance to Complete Evaluation   Min-Moderate modification of tasks or assist with assess necessary to complete eval    OT Frequency  2x / week    OT Duration  8 weeks    OT Treatment/Interventions  Self-care/ADL training;Aquatic Therapy;Cryotherapy;Electrical Stimulation;Moist Heat;Ultrasound;Contrast Bath;Fluidtherapy;Paraffin;Therapeutic exercise;Neuromuscular education;Energy conservation;DME and/or AE instruction;Manual Therapy;Functional Mobility Training;Cognitive remediation/compensation;Therapeutic activities;Splinting;Visual/perceptual remediation/compensation;Patient/family education;Balance training;Psychosocial skills training;Coping strategies training    Consulted and Agree with Plan of Care  Patient       Patient will benefit from skilled therapeutic intervention in order to improve the following deficits and impairments:   Body Structure / Function / Physical Skills: ADL, Coordination, Endurance, GMC, Muscle spasms, UE functional use, Balance, Decreased knowledge of precautions, Sensation, Body mechanics, Decreased knowledge of use of DME, Flexibility, IADL, Pain, Vision, Cardiopulmonary status limiting activity, Dexterity, FMC, Proprioception, Strength, Tone, ROM, Mobility, Edema Cognitive Skills: Attention, Thought, Emotional, Energy/Drive Psychosocial Skills: Coping Strategies, Habits, Interpersonal Interaction, Routines and Behaviors   Visit Diagnosis: Muscle weakness (generalized)    Problem List Patient Active Problem List   Diagnosis Date Noted  . Right shoulder pain 12/04/2018  . Neuropathic pain, arm 10/26/2018  . MDD (major depressive disorder),  single episode, moderate ([place]) 09/21/2018  . Hemiparesis of right dominant side ([place]) 08/17/2018  . Vestibular dizziness  01/21/2016  . Tick bite 07/15/2015  . Cerebrovascular disease 07/17/2014  . Preventative health care 06/25/2014  . Right sided sciatica 03/08/2013  . MENOPAUSAL SYNDROME 09/10/2008  . Mood disorder ([place]) 09/15/2007  . Essential hypertension, benign 08/10/2006  . Type 2 diabetes mellitus with other circulatory complications ([place]) 59/97/7414  . HYPERCHOLESTEROLEMIA 06/17/2006  . GERD 06/17/2006    Harrel Carina, MS, OTR/L 12/20/2018, 6:03 PM  [place] MAIN [facility] SERVICES [address] [place], [place], [address] Phone: [phone]   Fax:  [phone]  Name: Tami Jones MRN: [id number] Date of Birth: 1955/10/24

## 2018-12-21 ENCOUNTER — Ambulatory Visit: Payer: Managed Care, Other (non HMO) | Admitting: Physical Therapy

## 2018-12-25 ENCOUNTER — Ambulatory Visit: Payer: Managed Care, Other (non HMO)

## 2018-12-25 ENCOUNTER — Other Ambulatory Visit: Payer: Self-pay

## 2018-12-25 DIAGNOSIS — M6281 Muscle weakness (generalized): Secondary | ICD-10-CM

## 2018-12-25 DIAGNOSIS — R2681 Unsteadiness on feet: Secondary | ICD-10-CM

## 2018-12-25 DIAGNOSIS — I69351 Hemiplegia and hemiparesis following cerebral infarction affecting right dominant side: Secondary | ICD-10-CM

## 2018-12-25 DIAGNOSIS — R278 Other lack of coordination: Secondary | ICD-10-CM

## 2018-12-25 DIAGNOSIS — R2689 Other abnormalities of gait and mobility: Secondary | ICD-10-CM

## 2018-12-25 NOTE — Therapy (Signed)
[place] MAIN [facility] SERVICES [address] [place], [place], [address] Phone: [phone]   Fax:  [phone]  Physical Therapy Treatment  Patient Details  Name: Tami Jones MRN: [id number] Date of Birth: 05-14-1955 Referring Provider (PT): Viviana Simpler, MD   Encounter Date: 12/25/2018  PT End of Session - 12/25/18 1638    Visit Number  5    Number of Visits  17    Date for PT Re-Evaluation  01/07/19    Authorization Type  [employer]    PT Start Time  1600    PT Stop Time  1631    PT Time Calculation (min)  31 min    Equipment Utilized During Treatment  Gait belt    Activity Tolerance  Patient limited by fatigue    Behavior During Therapy  [facility] for tasks assessed/performed;Flat affect       Past Medical History:  Diagnosis Date  . Anxiety   . Asthma   . CVA (cerebral vascular accident) ([place]) 03/2006   right occipital, Dr. Doy Mince  . Diabetes mellitus   . GERD (gastroesophageal reflux disease)   . Hyperlipidemia   . Hypertension     Past Surgical History:  Procedure Laterality Date  . CARDIOVASCULAR STRESS TEST  1/15   myoview. EF 60%  . CESAREAN SECTION    . ESOPHAGOGASTRODUODENOSCOPY  05/2005  . TONSILLECTOMY AND ADENOIDECTOMY      There were no vitals filed for this visit.  Subjective Assessment - 12/25/18 1605    Subjective  Patient reports high pain in bilateral legs and R arm with pain 8/10, reports the other day was 10/10. Reports high nausea. no falls reported. Reports no bowel or bladder or loss of control of limbs.    Pertinent History  anxiety, type 2 DM, h/o major depressive disorder, Rt sided sciatica, cerebrovascular disease, SOB    Diagnostic tests  MRI, MRA - MRI revealed acute lunar infarct    Patient Stated Goals  Be able to walk with cane; be able to go shopping, increase endurance    Currently in Pain?  Yes    Pain Score  8     Pain Location  Other (Comment)   bilateral Legs and R arm   Pain  Orientation  Other (Comment)   bilateral   Pain Descriptors / Indicators  Throbbing;Shooting    Pain Type  Acute pain    Pain Radiating Towards  radiating down R arm, through bilateral LE's and groin    Pain Onset  In the past 7 days    Pain Frequency  Constant    Aggravating Factors   movement    Pain Relieving Factors  hot showers    Effect of Pain on Daily Activities  limited all movement         Patient reports high pain in bilateral legs and R arm with pain 8/10, reports the other day was 10/10.   Vitals 162/81 pulse 80   Seated:  RTB hamstring curl 10x  RTB abduction 10x  Heel toe raises 20x LAQ 15x each LE ; terminated due to pain   Standing hip flexor stretch : large step forward 30 second holds, painful   Patient session terminated early due to high level of pain. Patient highly encouraged to go to ED/Urgent Care due to change of symptoms, declined going.  Educated on why need to inform a medical provider due to worsening of symptoms, urged to call physician, patient agreeable to call  physician after session.                    PT Education - 12/25/18 1637    Education Details  go to ED/ urgent care, call physician    Person(s) Educated  Patient    Methods  Explanation    Comprehension  Verbalized understanding       PT Short Term Goals - 11/08/18 1111      PT SHORT TERM GOAL #1   Title  Pt will stand 2" unsupported at sink with SBA without LOB for incr. independence and safety with ADL's in standing.    Baseline  8.78 secs without UE support - 11-07-18    Time  4    Period  Weeks    Status  New    Target Date  12/07/18      PT SHORT TERM GOAL #2   Title  Improve Berg score from 13/56 to >/= 23/56 to demo improvement in standing balance.    Baseline  13/56 on 11-07-18    Time  4    Period  Weeks    Status  New    Target Date  12/07/18      PT SHORT TERM GOAL #3   Title  Improve TUG score from 26.13 secs with RW to </= 21 secs with RW  for improved functional mobility and reduced fall risk.    Baseline  26.13 secs with RW -11-07-18    Time  4    Period  Weeks    Status  New    Target Date  12/07/18      PT SHORT TERM GOAL #4   Title  Incr. gait velocity from 1.97 ft/sec with RW to >/= 2.3 ft/sec with RW for incr. gait efficiency.    Baseline  16.66 secs with RW = 1.97 ft/sec with RW    Time  4    Period  Weeks    Status  New    Target Date  12/07/18      PT SHORT TERM GOAL #5   Title  Independent in HEP for  balance & strengthening exercises.    Time  4    Period  Weeks    Status  New    Target Date  12/07/18        PT Long Term Goals - 11/08/18 1116      PT LONG TERM GOAL #1   Title  Pt will improve TUG score to >/= 16 secs with RW for reduced fall risk.    Baseline  26.13 secs with RW - 11-07-18    Time  8    Period  Weeks    Status  New    Target Date  01/07/19      PT LONG TERM GOAL #2   Title  Pt will amb. 300' without device with SBA for increased community accessibility.    Baseline  pt using RW -100' with SBA    Time  8    Period  Weeks    Status  New    Target Date  01/07/19      PT LONG TERM GOAL #3   Title  Modified independent household amb. (50') without device.    Time  8    Period  Weeks    Status  New    Target Date  01/07/19      PT LONG TERM GOAL #4   Title  Improve Berg score  from 13/56 to >/ 36/56 to reduce fall risk.    Baseline  13/56 on 11-07-18    Time  8    Period  Weeks    Status  New    Target Date  01/07/19      PT LONG TERM GOAL #5   Title  Increase gait speed from 1.97 ft/sec with RW to >/= 2.7 ft/sec with RW for incr. gait efficiency.    Baseline  16.66 secs = 1.97 ft/sec with RW on 11-07-18    Time  8    Period  Weeks    Status  New            Plan - 12/25/18 1639    Clinical Impression Statement  Patient session terminated early due to high level of pain. Patient highly encouraged to go to ED/Urgent Care due to change of symptoms, declined going.   Educated on why need to inform a medical provider due to worsening of symptoms, urged to call physician, patient agreeable to call physician after session.    Personal Factors and Comorbidities  Fitness;Comorbidity 2;Transportation;Profession;Behavior Pattern    Comorbidities  Type 2 DM, Rt sided sciatica, h/o major depressive disorder, anxiety, cerebrovascular disease    Examination-Activity Limitations  Bathing;Locomotion Level;Transfers;Reach Overhead;Stairs;Stand;Lift;Squat    Examination-Participation Restrictions  Meal Prep;Cleaning;Community Activity;Driving;Interpersonal Relationship;Laundry;Shop    Stability/Clinical Decision Making  Evolving/Moderate complexity    Rehab Potential  Good    PT Frequency  2x / week    PT Duration  8 weeks    PT Treatment/Interventions  ADLs/Self Care Home Management;Aquatic Therapy;Gait training;Stair training;Neuromuscular re-education;DME Instruction;Balance training;Therapeutic exercise;Therapeutic activities;Patient/family education    PT Next Visit Plan  HEP for balance and RLE strengthening - include sit to stand, standing unsupported (initiated this ex. on 11-07-18 at eval)    PT Home Exercise Plan  Added seated marches, knee extension, and heel/toe raises; see pt instructions    Consulted and Agree with Plan of Care  Patient       Patient will benefit from skilled therapeutic intervention in order to improve the following deficits and impairments:  Abnormal gait, Decreased activity tolerance, Decreased endurance, Decreased coordination, Decreased balance, Decreased strength, Impaired sensation, Impaired UE functional use, Pain  Visit Diagnosis: Muscle weakness (generalized)  Other lack of coordination  Other abnormalities of gait and mobility  Hemiplegia and hemiparesis following cerebral infarction affecting right dominant side (HCC)  Unsteadiness on feet     Problem List Patient Active Problem List   Diagnosis Date Noted  . Right  shoulder pain 12/04/2018  . Neuropathic pain, arm 10/26/2018  . MDD (major depressive disorder), single episode, moderate (HCC) 09/21/2018  . Hemiparesis of right dominant side (HCC) 08/17/2018  . Vestibular dizziness 01/21/2016  . Tick bite 07/15/2015  . Cerebrovascular disease 07/17/2014  . Preventative health care 06/25/2014  . Right sided sciatica 03/08/2013  . MENOPAUSAL SYNDROME 09/10/2008  . Mood disorder (HCC) 09/15/2007  . Essential hypertension, benign 08/10/2006  . Type 2 diabetes mellitus with other circulatory complications (HCC) 06/17/2006  . HYPERCHOLESTEROLEMIA 06/17/2006  . GERD 06/17/2006   Precious BardMarina Carrolyn Hilmes, PT, DPT   12/25/2018, 4:40 PM  [facility] [address] [place], [place], [address] Phone: [phone]   Fax:  [phone]  Name: Rolene ArbourCynthia B Mabry MRN: [id number] Date of Birth: 12/02/1955

## 2018-12-27 ENCOUNTER — Ambulatory Visit: Payer: Managed Care, Other (non HMO) | Admitting: Occupational Therapy

## 2018-12-27 ENCOUNTER — Ambulatory Visit: Payer: Managed Care, Other (non HMO) | Admitting: Physical Therapy

## 2018-12-27 ENCOUNTER — Other Ambulatory Visit: Payer: Self-pay

## 2018-12-27 ENCOUNTER — Encounter: Payer: Self-pay | Admitting: Occupational Therapy

## 2018-12-27 VITALS — BP 163/83

## 2018-12-27 DIAGNOSIS — M6281 Muscle weakness (generalized): Secondary | ICD-10-CM | POA: Diagnosis not present

## 2018-12-27 DIAGNOSIS — I69351 Hemiplegia and hemiparesis following cerebral infarction affecting right dominant side: Secondary | ICD-10-CM

## 2018-12-27 DIAGNOSIS — M25511 Pain in right shoulder: Secondary | ICD-10-CM

## 2018-12-27 DIAGNOSIS — R278 Other lack of coordination: Secondary | ICD-10-CM

## 2018-12-27 DIAGNOSIS — R41840 Attention and concentration deficit: Secondary | ICD-10-CM

## 2018-12-27 DIAGNOSIS — R208 Other disturbances of skin sensation: Secondary | ICD-10-CM

## 2018-12-27 DIAGNOSIS — R2689 Other abnormalities of gait and mobility: Secondary | ICD-10-CM

## 2018-12-27 DIAGNOSIS — R2681 Unsteadiness on feet: Secondary | ICD-10-CM

## 2018-12-27 NOTE — Therapy (Signed)
[facility] [facility] MAIN [facility] SERVICES [address] [place], [place], [address] Phone: [phone]   Fax:  [phone]  Physical Therapy Treatment  Patient Details  Name: Tami Jones MRN: [id number] Date of Birth: [birth year] Referring Provider (PT): Tillman Abide, MD   Encounter Date: 12/27/2018  PT End of Session - 12/27/18 1702    Visit Number  6    Number of Visits  17    Date for PT Re-Evaluation  01/07/19    Authorization Type  [employer]    PT Start Time  1650    PT Stop Time  1730    PT Time Calculation (min)  40 min    Equipment Utilized During Treatment  Gait belt    Activity Tolerance  Patient limited by fatigue    Behavior During Therapy  [facility] for tasks assessed/performed;Flat affect       Past Medical History:  Diagnosis Date  . Anxiety   . Asthma   . 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 Thad Ranger  . Diabetes mellitus   . GERD (gastroesophageal reflux disease)   . Hyperlipidemia   . Hypertension     Past Surgical History:  Procedure Laterality Date  . CARDIOVASCULAR STRESS TEST  1/15   myoview. EF 60%  . CESAREAN SECTION    . ESOPHAGOGASTRODUODENOSCOPY  05/2005  . TONSILLECTOMY AND ADENOIDECTOMY      Vitals:   12/27/18 1700  BP: (!) 163/83    Subjective Assessment - 12/27/18 1700    Subjective  Patient reports high pain in bilateral legs and R arm with pain 8/10, reports the other day was 10/10. Reports high nausea. no falls reported. Reports no bowel or bladder or loss of control of limbs.    Pertinent History  anxiety, type 2 DM, h/o major depressive disorder, Rt sided sciatica, cerebrovascular disease, SOB    Diagnostic tests  MRI, MRA - MRI revealed acute lunar infarct    Patient Stated Goals  Be able to walk with cane; be able to go shopping, increase endurance    Currently in Pain?  Yes    Pain Score  5     Pain Location  Leg    Pain Orientation  Left    Pain Descriptors /  Indicators  Aching    Pain Type  Chronic pain    Pain Radiating Towards  left leg hurts 5/10, right posterior leg hurts 5/10 and right arm (6/10)    Pain Onset  In the past 7 days    Pain Onset  More than a month ago       Treatment:   Nu-step x 5 mins , very slow spm, 30  Standing with UE support -airex pad, SUE support frequent lateral sway  airex pad: BUE support 4" step toe taps 10x each LE  BUE support 4" step ups 10x each LE; min A for support to stabilizing LE  Sit to stand with UE support: 10x, cueing    Ambulate 100 ft with RW and CGA, cueing for upright posture, weight acceptance to RUE and safety awareness.      Pt educated throughout session about proper posture and technique with exercises. Improved exercise technique, movement at target joints, use of target muscles after min to mod verbal, visual, tactile cues                         PT Education - 12/27/18 1702  Education Details  HEP    Person(s) Educated  Patient    Methods  Explanation    Comprehension  Verbalized understanding;Need further instruction       PT Short Term Goals - 11/08/18 1111      PT SHORT TERM GOAL #1   Title  Pt will stand 2" unsupported at sink with SBA without LOB for incr. independence and safety with ADL's in standing.    Baseline  8.78 secs without UE support - 11-07-18    Time  4    Period  Weeks    Status  New    Target Date  12/07/18      PT SHORT TERM GOAL #2   Title  Improve Berg score from 13/56 to >/= 23/56 to demo improvement in standing balance.    Baseline  13/56 on 11-07-18    Time  4    Period  Weeks    Status  New    Target Date  12/07/18      PT SHORT TERM GOAL #3   Title  Improve TUG score from 26.13 secs with RW to </= 21 secs with RW for improved functional mobility and reduced fall risk.    Baseline  26.13 secs with RW -11-07-18    Time  4    Period  Weeks    Status  New    Target Date  12/07/18      PT SHORT TERM  GOAL #4   Title  Incr. gait velocity from 1.97 ft/sec with RW to >/= 2.3 ft/sec with RW for incr. gait efficiency.    Baseline  16.66 secs with RW = 1.97 ft/sec with RW    Time  4    Period  Weeks    Status  New    Target Date  12/07/18      PT SHORT TERM GOAL #5   Title  Independent in HEP for  balance & strengthening exercises.    Time  4    Period  Weeks    Status  New    Target Date  12/07/18        PT Long Term Goals - 11/08/18 1116      PT LONG TERM GOAL #1   Title  Pt will improve TUG score to >/= 16 secs with RW for reduced fall risk.    Baseline  26.13 secs with RW - 11-07-18    Time  8    Period  Weeks    Status  New    Target Date  01/07/19      PT LONG TERM GOAL #2   Title  Pt will amb. 300' without device with SBA for increased community accessibility.    Baseline  pt using RW -100' with SBA    Time  8    Period  Weeks    Status  New    Target Date  01/07/19      PT LONG TERM GOAL #3   Title  Modified independent household amb. (50') without device.    Time  8    Period  Weeks    Status  New    Target Date  01/07/19      PT LONG TERM GOAL #4   Title  Improve Berg score from 13/56 to >/ 36/56 to reduce fall risk.    Baseline  13/56 on 11-07-18    Time  8    Period  Weeks    Status  New    Target Date  01/07/19      PT LONG TERM GOAL #5   Title  Increase gait speed from 1.97 ft/sec with RW to >/= 2.7 ft/sec with RW for incr. gait efficiency.    Baseline  16.66 secs = 1.97 ft/sec with RW on 11-07-18    Time  8    Period  Weeks    Status  New            Plan - 12/27/18 1703    Clinical Impression Statement  Patient is having moderate pain in RUE and BLE making therapy difficult to perform. She needs CGA for standing balance exercises and beginning strengthening and mobility training. She will continue to benefit from skilled PT to improve mobility and gait.    Personal Factors and Comorbidities  Fitness;Comorbidity  2;Transportation;Profession;Behavior Pattern    Comorbidities  Type 2 DM, Rt sided sciatica, h/o major depressive disorder, anxiety, cerebrovascular disease    Examination-Activity Limitations  Bathing;Locomotion Level;Transfers;Reach Overhead;Stairs;Stand;Lift;Squat    Examination-Participation Restrictions  Meal Prep;Cleaning;Community Activity;Driving;Interpersonal Relationship;Laundry;Shop    Stability/Clinical Decision Making  Evolving/Moderate complexity    Rehab Potential  Good    PT Frequency  2x / week    PT Duration  8 weeks    PT Treatment/Interventions  ADLs/Self Care Home Management;Aquatic Therapy;Gait training;Stair training;Neuromuscular re-education;DME Instruction;Balance training;Therapeutic exercise;Therapeutic activities;Patient/family education    PT Next Visit Plan  HEP for balance and RLE strengthening - include sit to stand, standing unsupported (initiated this ex. on 11-07-18 at eval)    PT Home Exercise Plan  Added seated marches, knee extension, and heel/toe raises; see pt instructions    Consulted and Agree with Plan of Care  Patient       Patient will benefit from skilled therapeutic intervention in order to improve the following deficits and impairments:  Abnormal gait, Decreased activity tolerance, Decreased endurance, Decreased coordination, Decreased balance, Decreased strength, Impaired sensation, Impaired UE functional use, Pain  Visit Diagnosis: Other lack of coordination  Muscle weakness (generalized)  Other abnormalities of gait and mobility  Hemiplegia and hemiparesis following cerebral infarction affecting right dominant side (HCC)  Unsteadiness on feet  Acute pain of right shoulder  Other disturbances of skin sensation  Attention and concentration deficit     Problem List Patient Active Problem List   Diagnosis Date Noted  . Right shoulder pain 12/04/2018  . Neuropathic pain, arm 10/26/2018  . MDD (major depressive disorder), single  episode, moderate ([place]) 09/21/2018  . Hemiparesis of right dominant side ([place]) 08/17/2018  . Vestibular dizziness 01/21/2016  . Tick bite 07/15/2015  . Cerebrovascular disease 07/17/2014  . Preventative health care 06/25/2014  . Right sided sciatica 03/08/2013  . MENOPAUSAL SYNDROME 09/10/2008  . Mood disorder ([place]) 09/15/2007  . Essential hypertension, benign 08/10/2006  . Type 2 diabetes mellitus with other circulatory complications ([place]) 19/50/9326  . HYPERCHOLESTEROLEMIA 06/17/2006  . GERD 06/17/2006    Alanson Puls , PT DPT 12/27/2018, 5:37 PM  [place] MAIN [facility] SERVICES [address] [place], [place], [address] Phone: [phone]   Fax:  [phone]  Name: CHENOAH MCNALLY MRN: [id number] Date of Birth: 07-08-1955

## 2018-12-27 NOTE — Therapy (Signed)
[place] MAIN [facility] SERVICES [address] [place], [place], [address] Phone: [phone]   Fax:  [phone]  Occupational Therapy Treatment  Patient Details  Name: Tami Jones MRN: [id number] Date of Birth: 1955-08-24 Referring Provider (OT): Viviana Simpler   Encounter Date: 12/27/2018  OT End of Session - 12/27/18 1623    Visit Number  11    Number of Visits  17    Date for OT Re-Evaluation  01/13/19    Authorization Time Period  Progress report period starting 11/14/2018    Activity Tolerance  Patient limited by pain;Patient tolerated treatment well    Behavior During Therapy  [facility] for tasks assessed/performed;Flat affect       Past Medical History:  Diagnosis Date  . Anxiety   . Asthma   . CVA (cerebral vascular accident) ([place]) 03/2006   right occipital, Dr. Doy Mince  . Diabetes mellitus   . GERD (gastroesophageal reflux disease)   . Hyperlipidemia   . Hypertension     Past Surgical History:  Procedure Laterality Date  . CARDIOVASCULAR STRESS TEST  1/15   myoview. EF 60%  . CESAREAN SECTION    . ESOPHAGOGASTRODUODENOSCOPY  05/2005  . TONSILLECTOMY AND ADENOIDECTOMY      There were no vitals filed for this visit.  Subjective Assessment - 12/27/18 1621    Subjective   Pt. reports that her medication may be bothering her stomoch.    Patient is accompanied by:  Family member    Pertinent History  HTN, Hyperlipidemia, DM, anxiety, CVA - 15 years ago with peripheral visual deficit, concussion - 3 years ago    Currently in Pain?  Yes    Pain Score  5     Pain Location  Shoulder    Pain Orientation  Right    Pain Descriptors / Indicators  Aching;Numbness;Tightness    Pain Type  Chronic pain;Neuropathic pain      OT TREATMENT    Neuro muscular re-education:  Pt. worked on grasping, and manipulating 1", 3/4", and 1/2" washers from a magnetic dish using 2pt grasp pattern. Pt. worked on reaching up, stabilizing, and  sustaining shoulder elevation while placing the washer over a small precise target on vertical dowels positioned at various angles. Pt. Worked on removing the washers alternating thumb opposition from the tip of her 2nd through 5th digits.  Response to Treatment:  Pt. reports that her medications may be upsetting her stomach, and causing diarrhea. Pt. reports having less pain in her shoulder today. Pt. continues to work on improving LUE strength, and [facility] skills. Pt. requires rest breaks, and repositioning of the diagonal dowel to a horizontal position 1/2 way through the session. Pt. continues to work on improving UE functioning duirng ADLs, and IADL tasks.                        OT Education - 12/27/18 1623    Education Details  positioning, [facility]    Person(s) Educated  Patient    Methods  Explanation;Demonstration;Verbal cues    Comprehension  Verbalized understanding;Returned demonstration       OT Short Term Goals - 12/20/18 1505      OT SHORT TERM GOAL #1   Title  Patient will complete a home activities program designed on improving functional use of RUE    Time  4    Period  Weeks    Status  Achieved      OT  SHORT TERM GOAL #2   Title  Patient will demonstrate sufficient balance to stand at counter to utilize BUE functionally for washing/drying dishes without assistance    Baseline  Pt. uses forearm support at the countertop, and the walker. Pt. was able to wash dishes, however is unable to do pots, and pans.    Time  4    Period  Weeks    Status  Partially Met    Target Date  01/13/19      OT SHORT TERM GOAL #3   Title  Patient will report pain no greater than 4/10 with RUE gentle mobilization  techniques either land or water based    Baseline  6/10. Improving duirng the nighttime after tking her [place].    Time  4    Period  Weeks    Status  Partially Met    Target Date  01/14/19      OT SHORT TERM GOAL #4   Title  Patient will show sufficient  right hand control and strength to pick up a full glass or mug of liquid and drink while seated.    Baseline  Pt. is able to hold a lightweight mug, or glass    Time  4    Period  Weeks    Status  Partially Met    Target Date  01/14/19      OT SHORT TERM GOAL #5   Title  Patient will show improved hand/wrist/forearm coordiantion to legibly write a full paragraph (3 sentences) using right hand.    Baseline  Pt. is making progress, howeevr continues to work on improvied legibility    Time  4    Period  Weeks    Status  On-going    Target Date  01/14/19        OT Long Term Goals - 12/20/18 1513      OT [place] #1   Title  Patient will independently complete an HEP designed to improve/restore RUE range of motion without report of pain    Baseline  Pt. conitnues to work on [employer].    Time  8    Period  Weeks    Status  On-going    Target Date  01/13/19      OT LONG TERM GOAL #2   Title  Patient will demonstrate sufficient RUE functional grasp and movement as well as balance to replace clean linens on bed, e.g. fitted sheet    Baseline  Pt. is able to pick up, and pull the end sheet. Pt. is unable to hold the ftted sheet, or the pillowcases.    Time  0    Period  Weeks    Status  On-going    Target Date  01/13/19      OT LONG TERM GOAL #3   Title  Patient will demonstrate sufficient motor control and balance to carry a glass or mug of liquid across 15 foot floor space without spilling    Baseline  Pt. continues to have difficulty    Time  8    Period  Weeks    Status  On-going    Target Date  01/14/19      OT LONG TERM GOAL #4   Title  Patient will demonstrate sufficient motor control and strength to utilize bilateral method to cut food as part of meal prep.    Baseline  Pt. is unable    Time  8    Period  Weeks    Status  On-going    Target Date  01/14/19      OT LONG TERM GOAL #5   Title  Patient will demonstrate understanding of recommendations  relating to returning to driving.    Baseline  Pt. is currently not driving    Time  8    Period  Weeks    Status  On-going            Plan - 12/27/18 1623    Clinical Impression Statement Pt. reports that her medications may be upsetting her stomach, and causing diarrhea. Pt. reports having less pain in her shoulder today. Pt. continues to work on improving LUE strength, and [facility] skills. Pt. requires rest breaks, and repositioning of the diagonal dowel to a horizontal position 1/2 way through the session. Pt. continues to work on improving UE functioning duirng ADLs, and IADL tasks.    OT Occupational Profile and History  Detailed Assessment- Review of Records and additional review of physical, cognitive, psychosocial history related to current functional performance    Occupational performance deficits (Please refer to evaluation for details):  ADL's;IADL's;Leisure;Rest and Sleep;Work    Marketing executive / Function / Physical Skills  ADL;Coordination;Endurance;GMC;Muscle spasms;UE functional use;Balance;Decreased knowledge of precautions;Sensation;Body mechanics;Decreased knowledge of use of DME;Flexibility;IADL;Pain;Vision;Cardiopulmonary status limiting activity;Dexterity;FMC;Proprioception;Strength;Tone;ROM;Mobility;Edema    Cognitive Skills  Attention;Thought;Emotional;Energy/Drive    Psychosocial Skills  Coping Strategies;Habits;Interpersonal Interaction;Routines and Behaviors    Rehab Potential  Good    Clinical Decision Making  Several treatment options, min-mod task modification necessary    Comorbidities Affecting Occupational Performance:  Presence of comorbidities impacting occupational performance    Comorbidities impacting occupational performance description:  HTN, DM, Hyperlipidemia, Anxiety, (self reported depression), 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    Modification or Assistance to Complete Evaluation   Min-Moderate modification of tasks or assist  with assess necessary to complete eval    OT Frequency  2x / week    OT Duration  8 weeks    OT Treatment/Interventions  Self-care/ADL training;Aquatic Therapy;Cryotherapy;Electrical Stimulation;Moist Heat;Ultrasound;Contrast Bath;Fluidtherapy;Paraffin;Therapeutic exercise;Neuromuscular education;Energy conservation;DME and/or AE instruction;Manual Therapy;Functional Mobility Training;Cognitive remediation/compensation;Therapeutic activities;Splinting;Visual/perceptual remediation/compensation;Patient/family education;Balance training;Psychosocial skills training;Coping strategies training    Consulted and Agree with Plan of Care  Patient       Patient will benefit from skilled therapeutic intervention in order to improve the following deficits and impairments:   Body Structure / Function / Physical Skills: ADL, Coordination, Endurance, GMC, Muscle spasms, UE functional use, Balance, Decreased knowledge of precautions, Sensation, Body mechanics, Decreased knowledge of use of DME, Flexibility, IADL, Pain, Vision, Cardiopulmonary status limiting activity, Dexterity, FMC, Proprioception, Strength, Tone, ROM, Mobility, Edema Cognitive Skills: Attention, Thought, Emotional, Energy/Drive Psychosocial Skills: Coping Strategies, Habits, Interpersonal Interaction, Routines and Behaviors   Visit Diagnosis: Muscle weakness (generalized)  Other lack of coordination    Problem List Patient Active Problem List   Diagnosis Date Noted  . Right shoulder pain 12/04/2018  . Neuropathic pain, arm 10/26/2018  . MDD (major depressive disorder), single episode, moderate ([place]) 09/21/2018  . Hemiparesis of right dominant side ([place]) 08/17/2018  . Vestibular dizziness 01/21/2016  . Tick bite 07/15/2015  . Cerebrovascular disease 07/17/2014  . Preventative health care 06/25/2014  . Right sided sciatica 03/08/2013  . MENOPAUSAL SYNDROME 09/10/2008  . Mood disorder ([place]) 09/15/2007  . Essential hypertension,  benign 08/10/2006  . Type 2 diabetes mellitus with other circulatory complications ([place]) 16/11/9602  . HYPERCHOLESTEROLEMIA 06/17/2006  . GERD 06/17/2006    Margaretha Sheffield  Akili Corsetti, MS, OTR/L 12/27/2018, 4:36 PM  [place] MAIN [facility] SERVICES [address] [place], [place], [address] Phone: [phone]   Fax:  [phone]  Name: Tami Jones MRN: [id number] Date of Birth: 1955-06-26

## 2019-01-01 ENCOUNTER — Encounter: Payer: Self-pay | Admitting: Occupational Therapy

## 2019-01-01 ENCOUNTER — Ambulatory Visit: Payer: Managed Care, Other (non HMO) | Admitting: Occupational Therapy

## 2019-01-01 ENCOUNTER — Ambulatory Visit: Payer: Managed Care, Other (non HMO)

## 2019-01-01 ENCOUNTER — Other Ambulatory Visit: Payer: Self-pay

## 2019-01-01 DIAGNOSIS — M6281 Muscle weakness (generalized): Secondary | ICD-10-CM

## 2019-01-01 DIAGNOSIS — R2689 Other abnormalities of gait and mobility: Secondary | ICD-10-CM

## 2019-01-01 DIAGNOSIS — I69351 Hemiplegia and hemiparesis following cerebral infarction affecting right dominant side: Secondary | ICD-10-CM

## 2019-01-01 DIAGNOSIS — R2681 Unsteadiness on feet: Secondary | ICD-10-CM

## 2019-01-01 DIAGNOSIS — R278 Other lack of coordination: Secondary | ICD-10-CM

## 2019-01-01 NOTE — Therapy (Addendum)
[place] MAIN [facility] SERVICES [address] [place], [place], [address] Phone: [phone]   Fax:  [phone]  Occupational Therapy Treatment  Patient Details  Name: Tami Jones MRN: [id number] Date of Birth: 04-15-1955 Referring Provider (OT): Viviana Simpler   Encounter Date: 01/01/2019  OT End of Session - 01/01/19 1628    Visit Number  12    Number of Visits  17    Date for OT Re-Evaluation  01/13/19    Authorization Time Period  Progress report period starting 11/14/2018    Authorization - Visit Number  1    Authorization - Number of Visits  54    OT Start Time  6010    OT Stop Time  1600    OT Time Calculation (min)  45 min    Activity Tolerance  Patient limited by pain;Patient tolerated treatment well    Behavior During Therapy  [facility] for tasks assessed/performed;Flat affect       Past Medical History:  Diagnosis Date  . Anxiety   . Asthma   . CVA (cerebral vascular accident) ([place]) 03/2006   right occipital, Dr. Doy Mince  . Diabetes mellitus   . GERD (gastroesophageal reflux disease)   . Hyperlipidemia   . Hypertension     Past Surgical History:  Procedure Laterality Date  . CARDIOVASCULAR STRESS TEST  1/15   myoview. EF 60%  . CESAREAN SECTION    . ESOPHAGOGASTRODUODENOSCOPY  05/2005  . TONSILLECTOMY AND ADENOIDECTOMY      There were no vitals filed for this visit.  Subjective Assessment - 01/01/19 1626    Subjective   Pt. reports that she took Gabapentin    Patient is accompanied by:  Family member    Pertinent History  HTN, Hyperlipidemia, DM, anxiety, CVA - 15 years ago with peripheral visual deficit, concussion - 3 years ago    Currently in Pain?  Yes    Pain Score  7     Pain Location  Shoulder    Pain Orientation  Right    Pain Descriptors / Indicators  Aching      OT TREATMENT    Neuro muscular re-education:  Pt. performed right hand [place] tasks using the Grooved pegboard. Pt. worked on grasping the  grooved pegs from a horizontal position, and moving the pegs to a vertical position in the hand to prepare for placing them in the grooved slot.    Therapeutic Exercise:  Pt. Performed AAROM in the right shoulder flexion, abduction, elbow flexion, extension, and wrist flexion, and extension. Pt. Worked on pinch strengthening in the right hand for lateral, and 3pt. pinch using yellow, and red resistive clips. Pt. worked on placing the clips at various vertical and horizontal angles. Tactile and verbal cues were required for eliciting the desired movement.  Response to Treatment:  Pt. is limited by 7/10 pain in her right shoulder, and 5/10 in her wrist. Pt. Reports taking Gabapentin during the day today which pt. Reports has caused fatigue. Pt. Continues to try to use her right UE for folding laundry, and washing cups.  Pt. continues to work on improving RUE ROM, right hand pinch strength, and grip strength in order to improve UE  functioning during ADLs, and IADLs.                           OT Education - 01/01/19 1627    Education Details  positioning, [facility]  Person(s) Educated  Patient    Methods  Explanation;Demonstration;Verbal cues    Comprehension  Verbalized understanding;Returned demonstration       OT Short Term Goals - 12/20/18 1505      OT SHORT TERM GOAL #1   Title  Patient will complete a home activities program designed on improving functional use of RUE    Time  4    Period  Weeks    Status  Achieved      OT SHORT TERM GOAL #2   Title  Patient will demonstrate sufficient balance to stand at counter to utilize BUE functionally for washing/drying dishes without assistance    Baseline  Pt. uses forearm support at the countertop, and the walker. Pt. was able to wash dishes, however is unable to do pots, and pans.    Time  4    Period  Weeks    Status  Partially Met    Target Date  01/13/19      OT SHORT TERM GOAL #3   Title  Patient will report pain  no greater than 4/10 with RUE gentle mobilization  techniques either land or water based    Baseline  6/10. Improving duirng the nighttime after tking her Gabepetin.    Time  4    Period  Weeks    Status  Partially Met    Target Date  01/14/19      OT SHORT TERM GOAL #4   Title  Patient will show sufficient right hand control and strength to pick up a full glass or mug of liquid and drink while seated.    Baseline  Pt. is able to hold a lightweight mug, or glass    Time  4    Period  Weeks    Status  Partially Met    Target Date  01/14/19      OT SHORT TERM GOAL #5   Title  Patient will show improved hand/wrist/forearm coordiantion to legibly write a full paragraph (3 sentences) using right hand.    Baseline  Pt. is making progress, howeevr continues to work on improvied legibility    Time  4    Period  Weeks    Status  On-going    Target Date  01/14/19        OT Long Term Goals - 12/20/18 1513      OT [place] #1   Title  Patient will independently complete an HEP designed to improve/restore RUE range of motion without report of pain    Baseline  Pt. conitnues to work on [employer].    Time  8    Period  Weeks    Status  On-going    Target Date  01/13/19      OT LONG TERM GOAL #2   Title  Patient will demonstrate sufficient RUE functional grasp and movement as well as balance to replace clean linens on bed, e.g. fitted sheet    Baseline  Pt. is able to pick up, and pull the end sheet. Pt. is unable to hold the ftted sheet, or the pillowcases.    Time  0    Period  Weeks    Status  On-going    Target Date  01/13/19      OT LONG TERM GOAL #3   Title  Patient will demonstrate sufficient motor control and balance to carry a glass or mug of liquid across 15 foot floor space without spilling  Baseline  Pt. continues to have difficulty    Time  8    Period  Weeks    Status  On-going    Target Date  01/14/19      OT LONG TERM GOAL #4   Title  Patient will  demonstrate sufficient motor control and strength to utilize bilateral method to cut food as part of meal prep.    Baseline  Pt. is unable    Time  8    Period  Weeks    Status  On-going    Target Date  01/14/19      OT LONG TERM GOAL #5   Title  Patient will demonstrate understanding of recommendations relating to returning to driving.    Baseline  Pt. is currently not driving    Time  8    Period  Weeks    Status  On-going            Plan - 01/01/19 1628    Clinical Impression Statement Pt. is limited by 7/10 pain in her right shoulder, and 5/10 in her wrist. Pt. Reports taking Gabapentin during the day today which pt. Reports has caused fatigue. Pt. Continues to try to use her right UE for folding laundry, and washing cups.  Pt. continues to work on improving RUE ROM, right hand pinch strength, and grip strength in order to improve UE  functioning during ADLs, and IADLs. .    OT Occupational Profile and History  Detailed Assessment- Review of Records and additional review of physical, cognitive, psychosocial history related to current functional performance    Occupational performance deficits (Please refer to evaluation for details):  ADL's;IADL's;Leisure;Rest and Sleep;Work    Marketing executive / Function / Physical Skills  ADL;Coordination;Endurance;GMC;Muscle spasms;UE functional use;Balance;Decreased knowledge of precautions;Sensation;Body mechanics;Decreased knowledge of use of DME;Flexibility;IADL;Pain;Vision;Cardiopulmonary status limiting activity;Dexterity;FMC;Proprioception;Strength;Tone;ROM;Mobility;Edema    Cognitive Skills  Attention;Thought;Emotional;Energy/Drive    Psychosocial Skills  Coping Strategies;Habits;Interpersonal Interaction;Routines and Behaviors    Rehab Potential  Good    Clinical Decision Making  Several treatment options, min-mod task modification necessary    Comorbidities Affecting Occupational Performance:  Presence of comorbidities impacting  occupational performance    Comorbidities impacting occupational performance description:  HTN, DM, Hyperlipidemia, Anxiety, (self reported depression), 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    Modification or Assistance to Complete Evaluation   Min-Moderate modification of tasks or assist with assess necessary to complete eval    OT Frequency  2x / week    OT Duration  8 weeks    OT Treatment/Interventions  Self-care/ADL training;Aquatic Therapy;Cryotherapy;Electrical Stimulation;Moist Heat;Ultrasound;Contrast Bath;Fluidtherapy;Paraffin;Therapeutic exercise;Neuromuscular education;Energy conservation;DME and/or AE instruction;Manual Therapy;Functional Mobility Training;Cognitive remediation/compensation;Therapeutic activities;Splinting;Visual/perceptual remediation/compensation;Patient/family education;Balance training;Psychosocial skills training;Coping strategies training    Recommended Other Services  aquatic therapy    Consulted and Agree with Plan of Care  Patient       Patient will benefit from skilled therapeutic intervention in order to improve the following deficits and impairments:   Body Structure / Function / Physical Skills: ADL, Coordination, Endurance, GMC, Muscle spasms, UE functional use, Balance, Decreased knowledge of precautions, Sensation, Body mechanics, Decreased knowledge of use of DME, Flexibility, IADL, Pain, Vision, Cardiopulmonary status limiting activity, Dexterity, FMC, Proprioception, Strength, Tone, ROM, Mobility, Edema Cognitive Skills: Attention, Thought, Emotional, Energy/Drive Psychosocial Skills: Coping Strategies, Habits, Interpersonal Interaction, Routines and Behaviors   Visit Diagnosis: Muscle weakness (generalized)    Problem List Patient Active Problem List   Diagnosis Date Noted  . Right shoulder pain  12/04/2018  . Neuropathic pain, arm 10/26/2018  . MDD (major depressive disorder), single episode, moderate ([place]) 09/21/2018   . Hemiparesis of right dominant side ([place]) 08/17/2018  . Vestibular dizziness 01/21/2016  . Tick bite 07/15/2015  . Cerebrovascular disease 07/17/2014  . Preventative health care 06/25/2014  . Right sided sciatica 03/08/2013  . MENOPAUSAL SYNDROME 09/10/2008  . Mood disorder ([place]) 09/15/2007  . Essential hypertension, benign 08/10/2006  . Type 2 diabetes mellitus with other circulatory complications ([place]) 68/38/7065  . HYPERCHOLESTEROLEMIA 06/17/2006  . GERD 06/17/2006    Harrel Carina, MS, OTR/L 01/01/2019, 4:37 PM  [place] MAIN [facility] SERVICES [address] [place], [place], [address] Phone: [phone]   Fax:  [phone]  Name: Tami Jones MRN: [id number] Date of Birth: 1955-03-28

## 2019-01-01 NOTE — Therapy (Signed)
[facility] [address] [place], [place], [address] Phone: [phone]   Fax:  [phone]  Physical Therapy Treatment  Patient Details  Name: Tami Jones MRN: [id number] Date of Birth: 01/27/1956 Referring Provider (PT): Tillman Abideichard Letvak, MD   Encounter Date: 01/01/2019  PT End of Session - 01/01/19 1605    Visit Number  7    Number of Visits  17    Date for PT Re-Evaluation  01/07/19    Authorization Type  [employer]    PT Start Time  1600    PT Stop Time  1644    PT Time Calculation (min)  44 min    Equipment Utilized During Treatment  Gait belt    Activity Tolerance  Patient limited by fatigue    Behavior During Therapy  [facility] for tasks assessed/performed;Flat affect       Past Medical History:  Diagnosis Date  . Anxiety   . Asthma   . 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 Thad Rangereynolds  . Diabetes mellitus   . GERD (gastroesophageal reflux disease)   . Hyperlipidemia   . Hypertension     Past Surgical History:  Procedure Laterality Date  . CARDIOVASCULAR STRESS TEST  1/15   myoview. EF 60%  . CESAREAN SECTION    . ESOPHAGOGASTRODUODENOSCOPY  05/2005  . TONSILLECTOMY AND ADENOIDECTOMY      There were no vitals filed for this visit.  Subjective Assessment - 01/01/19 1602    Subjective  Patient reports arm and leg pain. Is taking gabapentin today. Has been doing her exercises. Reports falling against cabinet in bathroom and cut arm Saturday, lost balance.    Pertinent History  anxiety, type 2 DM, h/o major depressive disorder, Rt sided sciatica, cerebrovascular disease, SOB    Diagnostic tests  MRI, MRA - MRI revealed acute lunar infarct    Patient Stated Goals  Be able to walk with cane; be able to go shopping, increase endurance    Currently in Pain?  Yes    Pain Score  7     Pain Location  Leg    Pain Orientation  Right    Pain Descriptors / Indicators  Aching    Pain Type   Chronic pain    Pain Onset  In the past 7 days    Pain Frequency  Constant    Pain Score  7    Pain Location  Arm    Pain Orientation  Right    Pain Descriptors / Indicators  Aching    Pain Type  Neuropathic pain;Chronic pain    Pain Onset  More than a month ago          Treatment:    Nu-step level 0 RPM> 30 for cardiovascular challenge. 3 minutes Very fatiguing to patient.   airex pad:   saebo ball transfer, took airex pad out from underneath due to two near LOB requiring Mod A to retain COM, transfer saebo ball on stable surface x 2 minutes with Min A    No UE support finding COM, Mod A for cueing to obtain COM.    BUE support cone taps, very challenging to RLE for coordination. Spatial awareness and coordination of RLE is challenging. x6 each LE   Side step in // bars cueing for increased step/foot clearance. challenging weight shift onto RLE.    Backwards ambulation in // bars with focus on slow focused steps with upright posture  2x length of // bars.   Seated RTB hamstring curl RLE 10x max cueing for body mechanics  Seated upright posture without UE support, very challenging for patient to obtain midline 30 second holds x 2 trials (added to HEP)  Seated upright posture modified "patty cake" reaching inside/outside BOS very challenging to patient, fatigues after 10x, limited ability to maintain upright posture.    Pt educated throughout session about proper posture and technique with exercises. Improved exercise technique, movement at target joints, use of target muscles after min to mod verbal, visual, tactile cues                    PT Education - 01/01/19 1559    Education Details  exercise technique, body mechanics    Person(s) Educated  Patient    Methods  Explanation;Demonstration;Tactile cues;Verbal cues    Comprehension  Verbalized understanding;Returned demonstration;Verbal cues required;Tactile cues required       PT Short Term Goals -  11/08/18 1111      PT SHORT TERM GOAL #1   Title  Pt will stand 2" unsupported at sink with SBA without LOB for incr. independence and safety with ADL's in standing.    Baseline  8.78 secs without UE support - 11-07-18    Time  4    Period  Weeks    Status  New    Target Date  12/07/18      PT SHORT TERM GOAL #2   Title  Improve Berg score from 13/56 to >/= 23/56 to demo improvement in standing balance.    Baseline  13/56 on 11-07-18    Time  4    Period  Weeks    Status  New    Target Date  12/07/18      PT SHORT TERM GOAL #3   Title  Improve TUG score from 26.13 secs with RW to </= 21 secs with RW for improved functional mobility and reduced fall risk.    Baseline  26.13 secs with RW -11-07-18    Time  4    Period  Weeks    Status  New    Target Date  12/07/18      PT SHORT TERM GOAL #4   Title  Incr. gait velocity from 1.97 ft/sec with RW to >/= 2.3 ft/sec with RW for incr. gait efficiency.    Baseline  16.66 secs with RW = 1.97 ft/sec with RW    Time  4    Period  Weeks    Status  New    Target Date  12/07/18      PT SHORT TERM GOAL #5   Title  Independent in HEP for  balance & strengthening exercises.    Time  4    Period  Weeks    Status  New    Target Date  12/07/18        PT Long Term Goals - 11/08/18 1116      PT LONG TERM GOAL #1   Title  Pt will improve TUG score to >/= 16 secs with RW for reduced fall risk.    Baseline  26.13 secs with RW - 11-07-18    Time  8    Period  Weeks    Status  New    Target Date  01/07/19      PT LONG TERM GOAL #2   Title  Pt will amb. 300' without device with SBA for increased community accessibility.  Baseline  pt using RW -100' with SBA    Time  8    Period  Weeks    Status  New    Target Date  01/07/19      PT LONG TERM GOAL #3   Title  Modified independent household amb. (50') without device.    Time  8    Period  Weeks    Status  New    Target Date  01/07/19      PT LONG TERM GOAL #4   Title  Improve  Berg score from 13/56 to >/ 36/56 to reduce fall risk.    Baseline  13/56 on 11-07-18    Time  8    Period  Weeks    Status  New    Target Date  01/07/19      PT LONG TERM GOAL #5   Title  Increase gait speed from 1.97 ft/sec with RW to >/= 2.7 ft/sec with RW for incr. gait efficiency.    Baseline  16.66 secs = 1.97 ft/sec with RW on 11-07-18    Time  8    Period  Weeks    Status  New            Plan - 01/01/19 1646    Clinical Impression Statement  Patient demonstrates excellent motivation despite pain/fatigue throughout session. Patient is limited in coordination and spatial awareness of RLE. She is limited in ability to find COM in both seated and standing positions. Seated COM stability intervention added to HEP. Patient would benefit from continued skilled PT to improve strength, balance and gait safety.    Personal Factors and Comorbidities  Fitness;Comorbidity 2;Transportation;Profession;Behavior Pattern    Comorbidities  Type 2 DM, Rt sided sciatica, h/o major depressive disorder, anxiety, cerebrovascular disease    Examination-Activity Limitations  Bathing;Locomotion Level;Transfers;Reach Overhead;Stairs;Stand;Lift;Squat    Examination-Participation Restrictions  Meal Prep;Cleaning;Community Activity;Driving;Interpersonal Relationship;Laundry;Shop    Stability/Clinical Decision Making  Evolving/Moderate complexity    Rehab Potential  Good    PT Frequency  2x / week    PT Duration  8 weeks    PT Treatment/Interventions  ADLs/Self Care Home Management;Aquatic Therapy;Gait training;Stair training;Neuromuscular re-education;DME Instruction;Balance training;Therapeutic exercise;Therapeutic activities;Patient/family education    PT Next Visit Plan  HEP for balance and RLE strengthening - include sit to stand, standing unsupported (initiated this ex. on 11-07-18 at eval)    PT Home Exercise Plan  Added seated marches, knee extension, and heel/toe raises; see pt instructions     Consulted and Agree with Plan of Care  Patient       Patient will benefit from skilled therapeutic intervention in order to improve the following deficits and impairments:  Abnormal gait, Decreased activity tolerance, Decreased endurance, Decreased coordination, Decreased balance, Decreased strength, Impaired sensation, Impaired UE functional use, Pain  Visit Diagnosis: Muscle weakness (generalized)  Other lack of coordination  Other abnormalities of gait and mobility  Hemiplegia and hemiparesis following cerebral infarction affecting right dominant side (HCC)  Unsteadiness on feet     Problem List Patient Active Problem List   Diagnosis Date Noted  . Right shoulder pain 12/04/2018  . Neuropathic pain, arm 10/26/2018  . MDD (major depressive disorder), single episode, moderate (HCC) 09/21/2018  . Hemiparesis of right dominant side (HCC) 08/17/2018  . Vestibular dizziness 01/21/2016  . Tick bite 07/15/2015  . Cerebrovascular disease 07/17/2014  . Preventative health care 06/25/2014  . Right sided sciatica 03/08/2013  . MENOPAUSAL SYNDROME 09/10/2008  . Mood disorder (HCC) 09/15/2007  .  Essential hypertension, benign 08/10/2006  . Type 2 diabetes mellitus with other circulatory complications (HCC) 06/17/2006  . HYPERCHOLESTEROLEMIA 06/17/2006  . GERD 06/17/2006   Precious Bard, PT, DPT   01/01/2019, 4:48 PM  [facility] [facility] MAIN [facility] SERVICES [address] [place], [place], [address] Phone: [phone]   Fax:  [phone]  Name: Tami Jones MRN: [id number] Date of Birth: 1955-03-08

## 2019-01-03 ENCOUNTER — Ambulatory Visit: Payer: Managed Care, Other (non HMO) | Admitting: Physical Therapy

## 2019-01-03 ENCOUNTER — Ambulatory Visit: Payer: Managed Care, Other (non HMO) | Admitting: Occupational Therapy

## 2019-01-03 ENCOUNTER — Other Ambulatory Visit: Payer: Self-pay

## 2019-01-03 ENCOUNTER — Encounter: Payer: Self-pay | Admitting: Physical Therapy

## 2019-01-03 ENCOUNTER — Encounter: Payer: Self-pay | Admitting: Occupational Therapy

## 2019-01-03 DIAGNOSIS — R2681 Unsteadiness on feet: Secondary | ICD-10-CM

## 2019-01-03 DIAGNOSIS — R278 Other lack of coordination: Secondary | ICD-10-CM

## 2019-01-03 DIAGNOSIS — M6281 Muscle weakness (generalized): Secondary | ICD-10-CM

## 2019-01-03 DIAGNOSIS — R2689 Other abnormalities of gait and mobility: Secondary | ICD-10-CM

## 2019-01-03 DIAGNOSIS — I69351 Hemiplegia and hemiparesis following cerebral infarction affecting right dominant side: Secondary | ICD-10-CM

## 2019-01-03 DIAGNOSIS — M25511 Pain in right shoulder: Secondary | ICD-10-CM

## 2019-01-03 DIAGNOSIS — R41840 Attention and concentration deficit: Secondary | ICD-10-CM

## 2019-01-03 DIAGNOSIS — R208 Other disturbances of skin sensation: Secondary | ICD-10-CM

## 2019-01-03 NOTE — Therapy (Addendum)
[place] MAIN [facility] SERVICES [address] [place], [place], [address] Phone: [phone]   Fax:  [phone]  Occupational Therapy Treatment  Patient Details  Name: Tami Jones MRN: [id number] Date of Birth: 09-Dec-1955 Referring Provider (OT): Viviana Simpler   Encounter Date: 01/03/2019  OT End of Session - 01/03/19 1746    Visit Number  13    Number of Visits  17    Date for OT Re-Evaluation  01/13/19    Authorization Type  [employer] - 60 VISIT LIMIT OT - 8 used    Authorization Time Period  Progress report period starting 11/14/2018    OT Start Time  1615    OT Stop Time  1645    OT Time Calculation (min)  30 min    Activity Tolerance  Patient limited by pain;Patient tolerated treatment well    Behavior During Therapy  [facility] for tasks assessed/performed;Flat affect       Past Medical History:  Diagnosis Date  . Anxiety   . Asthma   . CVA (cerebral vascular accident) ([place]) 03/2006   right occipital, Dr. Doy Mince  . Diabetes mellitus   . GERD (gastroesophageal reflux disease)   . Hyperlipidemia   . Hypertension     Past Surgical History:  Procedure Laterality Date  . CARDIOVASCULAR STRESS TEST  1/15   myoview. EF 60%  . CESAREAN SECTION    . ESOPHAGOGASTRODUODENOSCOPY  05/2005  . TONSILLECTOMY AND ADENOIDECTOMY      There were no vitals filed for this visit.  Subjective Assessment - 01/03/19 1745    Subjective   Pt. reports taking gabapentin.    Patient is accompanied by:  Family member    Pertinent History  HTN, Hyperlipidemia, DM, anxiety, CVA - 15 years ago with peripheral visual deficit, concussion - 3 years ago    Currently in Pain?  Yes    Pain Score  6     Pain Location  Shoulder    Pain Orientation  Right    Pain Descriptors / Indicators  Aching    Pain Type  Chronic pain       OT TREATMENT    Neuro muscular re-education:  Pt. worked on using her left hand for grasping and manipulating 1/2" flat marbles,  and worked on Clinical cytogeneticist moving the objects through her hand from her palm to the tip of her 2nd digit, and thumb to prepare them to be placed on a flat surface.  Therapeutic Exercise:  Pt. tolerated gentle AAROM in the right shoulder for flexion, abduction, horizontal abduction, adduction, elbow flexion, extension, forearm supination, and wrist extension. Pt. Worked on instrinsic stretches of the right hand with visual cues.  Response to Treatment:   Pt. continues to be limited by pain with reports of 6/10 pain in her right shoulder, and LE. Pt. reports pain at night, as well as all the time. Pt. was able to tolerate gentle active RUE ROM without complaints of pain. Pt. was able to engage her right hand during [facility] tasks without pain. Pt. continues to work on improving UE functioning in order to improve, and maximize independence with ADLs, and IADLs.                       OT Education - 01/03/19 1745    Education Details  positioning, [facility]    Person(s) Educated  Patient    Methods  Explanation;Demonstration;Verbal cues    Comprehension  Verbalized understanding;Returned  demonstration       OT Short Term Goals - 12/20/18 1505      OT SHORT TERM GOAL #1   Title  Patient will complete a home activities program designed on improving functional use of RUE    Time  4    Period  Weeks    Status  Achieved      OT SHORT TERM GOAL #2   Title  Patient will demonstrate sufficient balance to stand at counter to utilize BUE functionally for washing/drying dishes without assistance    Baseline  Pt. uses forearm support at the countertop, and the walker. Pt. was able to wash dishes, however is unable to do pots, and pans.    Time  4    Period  Weeks    Status  Partially Met    Target Date  01/13/19      OT SHORT TERM GOAL #3   Title  Patient will report pain no greater than 4/10 with RUE gentle mobilization  techniques either land or water based    Baseline  6/10.  Improving duirng the nighttime after tking her Gabepetin.    Time  4    Period  Weeks    Status  Partially Met    Target Date  01/14/19      OT SHORT TERM GOAL #4   Title  Patient will show sufficient right hand control and strength to pick up a full glass or mug of liquid and drink while seated.    Baseline  Pt. is able to hold a lightweight mug, or glass    Time  4    Period  Weeks    Status  Partially Met    Target Date  01/14/19      OT SHORT TERM GOAL #5   Title  Patient will show improved hand/wrist/forearm coordiantion to legibly write a full paragraph (3 sentences) using right hand.    Baseline  Pt. is making progress, howeevr continues to work on improvied legibility    Time  4    Period  Weeks    Status  On-going    Target Date  01/14/19        OT Long Term Goals - 12/20/18 1513      OT [place] #1   Title  Patient will independently complete an HEP designed to improve/restore RUE range of motion without report of pain    Baseline  Pt. conitnues to work on [employer].    Time  8    Period  Weeks    Status  On-going    Target Date  01/13/19      OT LONG TERM GOAL #2   Title  Patient will demonstrate sufficient RUE functional grasp and movement as well as balance to replace clean linens on bed, e.g. fitted sheet    Baseline  Pt. is able to pick up, and pull the end sheet. Pt. is unable to hold the ftted sheet, or the pillowcases.    Time  0    Period  Weeks    Status  On-going    Target Date  01/13/19      OT LONG TERM GOAL #3   Title  Patient will demonstrate sufficient motor control and balance to carry a glass or mug of liquid across 15 foot floor space without spilling    Baseline  Pt. continues to have difficulty    Time  8    Period  Weeks    Status  On-going    Target Date  01/14/19      OT LONG TERM GOAL #4   Title  Patient will demonstrate sufficient motor control and strength to utilize bilateral method to cut food as part of meal  prep.    Baseline  Pt. is unable    Time  8    Period  Weeks    Status  On-going    Target Date  01/14/19      OT LONG TERM GOAL #5   Title  Patient will demonstrate understanding of recommendations relating to returning to driving.    Baseline  Pt. is currently not driving    Time  8    Period  Weeks    Status  On-going            Plan - 01/03/19 1747    Clinical Impression Statement  Pt. continues to be limited by pain with reports of 6/10 pain in her right shoulder, and LE. Pt. reports pain at night, as well as all the time. Pt. was able to tolerate gentle active RUE ROM without complaints of pain. Pt. was able to engage her right hand during [facility] tasks without pain. Pt. continues to work on improving UE functioning in order to improve, and maximize independence with ADLs, and IADLs.    OT Occupational Profile and History  Detailed Assessment- Review of Records and additional review of physical, cognitive, psychosocial history related to current functional performance    Occupational performance deficits (Please refer to evaluation for details):  ADL's;IADL's;Leisure;Rest and Sleep;Work    Marketing executive / Function / Physical Skills  ADL;Coordination;Endurance;GMC;Muscle spasms;UE functional use;Balance;Decreased knowledge of precautions;Sensation;Body mechanics;Decreased knowledge of use of DME;Flexibility;IADL;Pain;Vision;Cardiopulmonary status limiting activity;Dexterity;FMC;Proprioception;Strength;Tone;ROM;Mobility;Edema    Cognitive Skills  Attention;Thought;Emotional;Energy/Drive    Psychosocial Skills  Coping Strategies;Habits;Interpersonal Interaction;Routines and Behaviors    Rehab Potential  Good    Clinical Decision Making  Several treatment options, min-mod task modification necessary    Comorbidities impacting occupational performance description:  HTN, DM, Hyperlipidemia, Anxiety, (self reported depression), 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     Modification or Assistance to Complete Evaluation   Min-Moderate modification of tasks or assist with assess necessary to complete eval    OT Frequency  2x / week    OT Duration  8 weeks    OT Treatment/Interventions  Self-care/ADL training;Aquatic Therapy;Cryotherapy;Electrical Stimulation;Moist Heat;Ultrasound;Contrast Bath;Fluidtherapy;Paraffin;Therapeutic exercise;Neuromuscular education;Energy conservation;DME and/or AE instruction;Manual Therapy;Functional Mobility Training;Cognitive remediation/compensation;Therapeutic activities;Splinting;Visual/perceptual remediation/compensation;Patient/family education;Balance training;Psychosocial skills training;Coping strategies training    Consulted and Agree with Plan of Care  Patient       Patient will benefit from skilled therapeutic intervention in order to improve the following deficits and impairments:   Body Structure / Function / Physical Skills: ADL, Coordination, Endurance, GMC, Muscle spasms, UE functional use, Balance, Decreased knowledge of precautions, Sensation, Body mechanics, Decreased knowledge of use of DME, Flexibility, IADL, Pain, Vision, Cardiopulmonary status limiting activity, Dexterity, FMC, Proprioception, Strength, Tone, ROM, Mobility, Edema Cognitive Skills: Attention, Thought, Emotional, Energy/Drive Psychosocial Skills: Coping Strategies, Habits, Interpersonal Interaction, Routines and Behaviors   Visit Diagnosis: Muscle weakness (generalized)  Other lack of coordination    Problem List Patient Active Problem List   Diagnosis Date Noted  . Right shoulder pain 12/04/2018  . Neuropathic pain, arm 10/26/2018  . MDD (major depressive disorder), single episode, moderate ([place]) 09/21/2018  . Hemiparesis of right dominant side ([place]) 08/17/2018  . Vestibular dizziness 01/21/2016  .  Tick bite 07/15/2015  . Cerebrovascular disease 07/17/2014  . Preventative health care 06/25/2014  . Right sided sciatica 03/08/2013  .  MENOPAUSAL SYNDROME 09/10/2008  . Mood disorder ([place]) 09/15/2007  . Essential hypertension, benign 08/10/2006  . Type 2 diabetes mellitus with other circulatory complications ([place]) 35/45/6256  . HYPERCHOLESTEROLEMIA 06/17/2006  . GERD 06/17/2006    Harrel Carina, MS, OTR/L 01/03/2019, 6:26 PM  [place] MAIN [facility] SERVICES [address] [place], [place], [address] Phone: [phone]   Fax:  [phone]  Name: KATHLENE YANO MRN: [id number] Date of Birth: 1955-04-06

## 2019-01-03 NOTE — Therapy (Signed)
[facility] [address] [place], [place], [id number] Phone: [phone]   Fax:  [phone]  Physical Therapy Treatment  Patient Details  Name: Tami Jones MRN: [id number] Date of Birth: 02/01/1956 Referring Provider (PT): Tillman Abideichard Letvak, MD   Encounter Date: 01/03/2019  PT End of Session - 01/03/19 1712    Visit Number  8    Number of Visits  17    Date for PT Re-Evaluation  01/07/19    Authorization - Visit Number  12    PT Start Time  1645    PT Stop Time  1730    PT Time Calculation (min)  45 min    Equipment Utilized During Treatment  Gait belt    Activity Tolerance  Patient tolerated treatment well       Past Medical History:  Diagnosis Date  . Anxiety   . Asthma   . 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 Thad Rangereynolds  . Diabetes mellitus   . GERD (gastroesophageal reflux disease)   . Hyperlipidemia   . Hypertension     Past Surgical History:  Procedure Laterality Date  . CARDIOVASCULAR STRESS TEST  1/15   myoview. EF 60%  . CESAREAN SECTION    . ESOPHAGOGASTRODUODENOSCOPY  05/2005  . TONSILLECTOMY AND ADENOIDECTOMY      There were no vitals filed for this visit.  Subjective Assessment - 01/03/19 1710    Subjective  Patient reports arm and leg pain. Is taking gabapentin today. Has been doing her exercises. Reports falling against cabinet in bathroom and cut arm Saturday, lost balance.    Pertinent History  anxiety, type 2 DM, h/o major depressive disorder, Rt sided sciatica, cerebrovascular disease, SOB    Diagnostic tests  MRI, MRA - MRI revealed acute lunar infarct    Patient Stated Goals  Be able to walk with cane; be able to go shopping, increase endurance    Currently in Pain?  Yes    Pain Score  7     Pain Location  Knee    Pain Orientation  Right    Pain Descriptors / Indicators  Aching    Pain Type  Chronic pain    Pain Radiating Towards  left leg hurts  7/10, right post leg hurts 7/10 and right arm 7/10    Pain Onset  In the past 7 days    Aggravating Factors   movement    Pain Relieving Factors  hot showers    Effect of Pain on Daily Activities  limited in all movement    Multiple Pain Sites  No    Pain Onset  More than a month ago       Treatment: Nu-step x 5 mins , very slow speed Standing hip abd BLE x 10 x 2 sets Standing hip extension BLE x 10 x 2 sets Marching x 10 BLE Gait training with RW , head turns and dual task x 100 feet with supervision   Pt educated throughout session about proper posture and technique with exercises. Improved exercise technique, movement at target joints, use of target muscles after min to mod verbal, visual, tactile cues.                       PT Education - 01/03/19 1712    Education Details  HEP    Person(s) Educated  Patient    Methods  Explanation    Comprehension  Returned demonstration;Tactile cues required;Need further instruction       PT Short Term Goals - 11/08/18 1111      PT SHORT TERM GOAL #1   Title  Pt will stand 2" unsupported at sink with SBA without LOB for incr. independence and safety with ADL's in standing.    Baseline  8.78 secs without UE support - 11-07-18    Time  4    Period  Weeks    Status  New    Target Date  12/07/18      PT SHORT TERM GOAL #2   Title  Improve Berg score from 13/56 to >/= 23/56 to demo improvement in standing balance.    Baseline  13/56 on 11-07-18    Time  4    Period  Weeks    Status  New    Target Date  12/07/18      PT SHORT TERM GOAL #3   Title  Improve TUG score from 26.13 secs with RW to </= 21 secs with RW for improved functional mobility and reduced fall risk.    Baseline  26.13 secs with RW -11-07-18    Time  4    Period  Weeks    Status  New    Target Date  12/07/18      PT SHORT TERM GOAL #4   Title  Incr. gait velocity from 1.97 ft/sec with RW to >/= 2.3 ft/sec with RW for incr. gait efficiency.     Baseline  16.66 secs with RW = 1.97 ft/sec with RW    Time  4    Period  Weeks    Status  New    Target Date  12/07/18      PT SHORT TERM GOAL #5   Title  Independent in HEP for  balance & strengthening exercises.    Time  4    Period  Weeks    Status  New    Target Date  12/07/18        PT Long Term Goals - 11/08/18 1116      PT LONG TERM GOAL #1   Title  Pt will improve TUG score to >/= 16 secs with RW for reduced fall risk.    Baseline  26.13 secs with RW - 11-07-18    Time  8    Period  Weeks    Status  New    Target Date  01/07/19      PT LONG TERM GOAL #2   Title  Pt will amb. 300' without device with SBA for increased community accessibility.    Baseline  pt using RW -100' with SBA    Time  8    Period  Weeks    Status  New    Target Date  01/07/19      PT LONG TERM GOAL #3   Title  Modified independent household amb. (50') without device.    Time  8    Period  Weeks    Status  New    Target Date  01/07/19      PT LONG TERM GOAL #4   Title  Improve Berg score from 13/56 to >/ 36/56 to reduce fall risk.    Baseline  13/56 on 11-07-18    Time  8    Period  Weeks    Status  New    Target Date  01/07/19      PT LONG TERM GOAL #5  Title  Increase gait speed from 1.97 ft/sec with RW to >/= 2.7 ft/sec with RW for incr. gait efficiency.    Baseline  16.66 secs = 1.97 ft/sec with RW on 11-07-18    Time  8    Period  Weeks    Status  New            Plan - 01/03/19 1713    Clinical Impression Statement  Patient   instructed in transfers with rest periods standing. Patient performs intermediate LE exercises in standing open chain. Patient will continue to benefit from skilled PT to improve strength, balance and gait. Patient continues to have LE weakness and decreased static and dynamic standing balance.    Personal Factors and Comorbidities  Fitness;Comorbidity 2;Transportation;Profession;Behavior Pattern    Comorbidities  Type 2 DM, Rt sided sciatica, h/o  major depressive disorder, anxiety, cerebrovascular disease    Examination-Activity Limitations  Bathing;Locomotion Level;Transfers;Reach Overhead;Stairs;Stand;Lift;Squat    Examination-Participation Restrictions  Meal Prep;Cleaning;Community Activity;Driving;Interpersonal Relationship;Laundry;Shop    Stability/Clinical Decision Making  Evolving/Moderate complexity    Rehab Potential  Good    PT Frequency  2x / week    PT Duration  8 weeks    PT Treatment/Interventions  ADLs/Self Care Home Management;Aquatic Therapy;Gait training;Stair training;Neuromuscular re-education;DME Instruction;Balance training;Therapeutic exercise;Therapeutic activities;Patient/family education    PT Next Visit Plan  HEP for balance and RLE strengthening - include sit to stand, standing unsupported (initiated this ex. on 11-07-18 at eval)    PT Home Exercise Plan  Added seated marches, knee extension, and heel/toe raises; see pt instructions    Consulted and Agree with Plan of Care  Patient       Patient will benefit from skilled therapeutic intervention in order to improve the following deficits and impairments:  Abnormal gait, Decreased activity tolerance, Decreased endurance, Decreased coordination, Decreased balance, Decreased strength, Impaired sensation, Impaired UE functional use, Pain  Visit Diagnosis: Muscle weakness (generalized)  Other lack of coordination  Other abnormalities of gait and mobility  Hemiplegia and hemiparesis following cerebral infarction affecting right dominant side ([facility])  Unsteadiness on feet  Acute pain of right shoulder  Other disturbances of skin sensation  Attention and concentration deficit     Problem List Patient Active Problem List   Diagnosis Date Noted  . Right shoulder pain 12/04/2018  . Neuropathic pain, arm 10/26/2018  . MDD (major depressive disorder), single episode, moderate ([facility]) 09/21/2018  . Hemiparesis of right dominant side ([facility]) 08/17/2018  .  Vestibular dizziness 01/21/2016  . Tick bite 07/15/2015  . Cerebrovascular disease 07/17/2014  . Preventative health care 06/25/2014  . Right sided sciatica 03/08/2013  . MENOPAUSAL SYNDROME 09/10/2008  . Mood disorder ([facility]) 09/15/2007  . Essential hypertension, benign 08/10/2006  . Type 2 diabetes mellitus with other circulatory complications ([facility]) 06/17/2006  . HYPERCHOLESTEROLEMIA 06/17/2006  . GERD 06/17/2006    Ezekiel Ina, PT DPT 01/03/2019, 5:14 PM   [facility] MAIN [facility] SERVICES [address] [place], [place], [address] Phone: [phone]   Fax:  [phone]  Name: Tami Jones MRN: [id number] Date of Birth: 1955-06-12

## 2019-01-04 ENCOUNTER — Encounter: Payer: Self-pay | Admitting: Physical Therapy

## 2019-01-09 ENCOUNTER — Other Ambulatory Visit: Payer: Self-pay

## 2019-01-09 ENCOUNTER — Ambulatory Visit: Payer: Managed Care, Other (non HMO) | Admitting: Physical Therapy

## 2019-01-09 ENCOUNTER — Encounter: Payer: Self-pay | Admitting: Physical Therapy

## 2019-01-09 ENCOUNTER — Ambulatory Visit: Payer: Managed Care, Other (non HMO) | Admitting: Occupational Therapy

## 2019-01-09 DIAGNOSIS — R208 Other disturbances of skin sensation: Secondary | ICD-10-CM

## 2019-01-09 DIAGNOSIS — M6281 Muscle weakness (generalized): Secondary | ICD-10-CM

## 2019-01-09 DIAGNOSIS — M25511 Pain in right shoulder: Secondary | ICD-10-CM

## 2019-01-09 DIAGNOSIS — R2689 Other abnormalities of gait and mobility: Secondary | ICD-10-CM

## 2019-01-09 DIAGNOSIS — R278 Other lack of coordination: Secondary | ICD-10-CM

## 2019-01-09 DIAGNOSIS — I69351 Hemiplegia and hemiparesis following cerebral infarction affecting right dominant side: Secondary | ICD-10-CM

## 2019-01-09 DIAGNOSIS — R2681 Unsteadiness on feet: Secondary | ICD-10-CM

## 2019-01-09 DIAGNOSIS — R41840 Attention and concentration deficit: Secondary | ICD-10-CM

## 2019-01-09 NOTE — Therapy (Signed)
[place] MAIN [facility] SERVICES [address] [place], [place], [address] Phone: [phone]   Fax:  [phone]  Occupational Therapy Treatment  Patient Details  Name: Tami Jones MRN: [id number] Date of Birth: 1955-06-18 Referring Provider (OT): Viviana Simpler   Encounter Date: 01/09/2019  OT End of Session - 01/09/19 8841    Visit Number  14    Number of Visits  17    Date for OT Re-Evaluation  01/13/19    Authorization Type  [employer] - 60 VISIT LIMIT OT - 8 used    Authorization Time Period  Progress report period starting 11/14/2018    OT Start Time  1645    OT Stop Time  1730    OT Time Calculation (min)  45 min    Activity Tolerance  Patient limited by pain;Patient tolerated treatment well    Behavior During Therapy  [facility] for tasks assessed/performed;Flat affect       Past Medical History:  Diagnosis Date  . Anxiety   . Asthma   . CVA (cerebral vascular accident) ([place]) 03/2006   right occipital, Dr. Doy Mince  . Diabetes mellitus   . GERD (gastroesophageal reflux disease)   . Hyperlipidemia   . Hypertension     Past Surgical History:  Procedure Laterality Date  . CARDIOVASCULAR STRESS TEST  1/15   myoview. EF 60%  . CESAREAN SECTION    . ESOPHAGOGASTRODUODENOSCOPY  05/2005  . TONSILLECTOMY AND ADENOIDECTOMY      There were no vitals filed for this visit.  Subjective Assessment - 01/09/19 1652    Subjective   Pt reports taking Gabapentin 3 times a day and rec talking to her doctor if this is a safe dose or not.    Patient is accompanied by:  Family member    Pertinent History  HTN, Hyperlipidemia, DM, anxiety, CVA - 15 years ago with peripheral visual deficit, concussion - 3 years ago    Currently in Pain?  Yes    Pain Score  7     Pain Location  Arm    Pain Orientation  Right    Pain Descriptors / Indicators  Aching    Pain Type  Chronic pain    Pain Radiating Towards  fingers    Pain Onset  More than a month ago     Pain Frequency  Constant    Aggravating Factors   picking up anything heavy like coffee cup    Pain Relieving Factors  hot showers    Effect of Pain on Daily Activities  limits how much she can use it for ADLs        OT TREATMENT    Neuro muscular re-education:  Pt. worked on using her right hand for grasping and manipulating 1/2" flat marbles, and worked on Clinical cytogeneticist moving the objects through her hand from her palm to the tip of her 2nd digit, and thumb to prepare them to be placed on a flat surface. She did not tolerate this very well and had increased pain as she moved her hand especially at thenar eminence and near biceps tendon on RUE that had shooting pain into her hand.  Reviewed chart to see if she had any imaging after injury to her RUE when helping her grandson out of bath tub and she did not have any listed in chart and encouraged her to talk to MD about having imaging to ensure she did not have an orthopedic issue.  Hot pack  applied and manual therapy applied to RUE as tolerated to help with fluid flow and pain.  She has mild reddening at her MCP joints today as well.  She reported that she has increased her Gabapentin to 3 times a day and concern was expressed about this as well and rec this also be discussed with her doctor.  She is trying to use her RUE and hand for ADLs but is very limited due to pain throughout the day and when trying to go to sleep.  Manual therapy:  Pt. tolerated gentle PROM with gentle trigger point releases in the right shoulder, forearm and hand.   Response to Treatment:   Pt. continues to be limited by pain with reports of 7/10 pain in her right shoulder, and LE. Pt. reports pain at night, as well as all the time. Pt. was able to tolerate gentle active RUE ROM but had complaints of pain. Pt. was not able to engage her right hand during [facility] tasks without pain. Pt. continues to work on improving UE functioning in order to improve, and  maximize independence with ADLs.  Strongly encouraged her to have imaging done of RUE and hand to ensure she does not have an orthopedic injury.                      OT Education - 01/09/19 1656    Education Details  positioning, [place], pain control with icing    Person(s) Educated  Patient    Methods  Explanation;Demonstration;Verbal cues    Comprehension  Verbalized understanding;Returned demonstration       OT Short Term Goals - 12/20/18 1505      OT SHORT TERM GOAL #1   Title  Patient will complete a home activities program designed on improving functional use of RUE    Time  4    Period  Weeks    Status  Achieved      OT SHORT TERM GOAL #2   Title  Patient will demonstrate sufficient balance to stand at counter to utilize BUE functionally for washing/drying dishes without assistance    Baseline  Pt. uses forearm support at the countertop, and the walker. Pt. was able to wash dishes, however is unable to do pots, and pans.    Time  4    Period  Weeks    Status  Partially Met    Target Date  01/13/19      OT SHORT TERM GOAL #3   Title  Patient will report pain no greater than 4/10 with RUE gentle mobilization  techniques either land or water based    Baseline  6/10. Improving duirng the nighttime after tking her Gabepetin.    Time  4    Period  Weeks    Status  Partially Met    Target Date  01/14/19      OT SHORT TERM GOAL #4   Title  Patient will show sufficient right hand control and strength to pick up a full glass or mug of liquid and drink while seated.    Baseline  Pt. is able to hold a lightweight mug, or glass    Time  4    Period  Weeks    Status  Partially Met    Target Date  01/14/19      OT SHORT TERM GOAL #5   Title  Patient will show improved hand/wrist/forearm coordiantion to legibly write a full paragraph (3 sentences) using right hand.  Baseline  Pt. is making progress, howeevr continues to work on improvied legibility    Time  4     Period  Weeks    Status  On-going    Target Date  01/14/19        OT Long Term Goals - 12/20/18 1513      OT [place] #1   Title  Patient will independently complete an HEP designed to improve/restore RUE range of motion without report of pain    Baseline  Pt. conitnues to work on [employer].    Time  8    Period  Weeks    Status  On-going    Target Date  01/13/19      OT LONG TERM GOAL #2   Title  Patient will demonstrate sufficient RUE functional grasp and movement as well as balance to replace clean linens on bed, e.g. fitted sheet    Baseline  Pt. is able to pick up, and pull the end sheet. Pt. is unable to hold the ftted sheet, or the pillowcases.    Time  0    Period  Weeks    Status  On-going    Target Date  01/13/19      OT LONG TERM GOAL #3   Title  Patient will demonstrate sufficient motor control and balance to carry a glass or mug of liquid across 15 foot floor space without spilling    Baseline  Pt. continues to have difficulty    Time  8    Period  Weeks    Status  On-going    Target Date  01/14/19      OT LONG TERM GOAL #4   Title  Patient will demonstrate sufficient motor control and strength to utilize bilateral method to cut food as part of meal prep.    Baseline  Pt. is unable    Time  8    Period  Weeks    Status  On-going    Target Date  01/14/19      OT LONG TERM GOAL #5   Title  Patient will demonstrate understanding of recommendations relating to returning to driving.    Baseline  Pt. is currently not driving    Time  8    Period  Weeks    Status  On-going            Plan - 01/09/19 1716    Clinical Impression Statement  Pt. continues to be limited by pain with reports of 7/10 pain in her right shoulder and hand with a very tender area near elbow which radiates into hand, and LE. Pt. reports pain at night, as well as all the time. Pt. was able to tolerate gentle active RUE ROM without complaints of pain. Pt. was able to  engage her right hand during [facility] tasks without pain. Pt. continues to work on improving UE functioning in order to improve, and maximize independence with ADLs, and IADLs.    OT Occupational Profile and History  Detailed Assessment- Review of Records and additional review of physical, cognitive, psychosocial history related to current functional performance    Occupational performance deficits (Please refer to evaluation for details):  ADL's;IADL's;Leisure;Rest and Sleep;Work    Marketing executive / Function / Physical Skills  ADL;Coordination;Endurance;GMC;Muscle spasms;UE functional use;Balance;Decreased knowledge of precautions;Sensation;Body mechanics;Decreased knowledge of use of DME;Flexibility;IADL;Pain;Vision;Cardiopulmonary status limiting activity;Dexterity;FMC;Proprioception;Strength;Tone;ROM;Mobility;Edema    Cognitive Skills  Attention;Thought;Emotional;Energy/Drive    Psychosocial Skills  Coping Strategies;Habits;Interpersonal Interaction;Routines and Behaviors  Rehab Potential  Good    Clinical Decision Making  Several treatment options, min-mod task modification necessary    Comorbidities Affecting Occupational Performance:  Presence of comorbidities impacting occupational performance    Comorbidities impacting occupational performance description:  HTN, DM, Hyperlipidemia, Anxiety, (self reported depression), 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    Modification or Assistance to Complete Evaluation   Min-Moderate modification of tasks or assist with assess necessary to complete eval    OT Frequency  2x / week    OT Duration  8 weeks    OT Treatment/Interventions  Self-care/ADL training;Aquatic Therapy;Cryotherapy;Electrical Stimulation;Moist Heat;Ultrasound;Contrast Bath;Fluidtherapy;Paraffin;Therapeutic exercise;Neuromuscular education;Energy conservation;DME and/or AE instruction;Manual Therapy;Functional Mobility Training;Cognitive  remediation/compensation;Therapeutic activities;Splinting;Visual/perceptual remediation/compensation;Patient/family education;Balance training;Psychosocial skills training;Coping strategies training    Consulted and Agree with Plan of Care  Patient       Patient will benefit from skilled therapeutic intervention in order to improve the following deficits and impairments:   Body Structure / Function / Physical Skills: ADL, Coordination, Endurance, GMC, Muscle spasms, UE functional use, Balance, Decreased knowledge of precautions, Sensation, Body mechanics, Decreased knowledge of use of DME, Flexibility, IADL, Pain, Vision, Cardiopulmonary status limiting activity, Dexterity, FMC, Proprioception, Strength, Tone, ROM, Mobility, Edema Cognitive Skills: Attention, Thought, Emotional, Energy/Drive Psychosocial Skills: Coping Strategies, Habits, Interpersonal Interaction, Routines and Behaviors   Visit Diagnosis: Muscle weakness (generalized)  Other lack of coordination  Hemiplegia and hemiparesis following cerebral infarction affecting right dominant side [facility])    Problem List Patient Active Problem List   Diagnosis Date Noted  . Right shoulder pain 12/04/2018  . Neuropathic pain, arm 10/26/2018  . MDD (major depressive disorder), single episode, moderate ([place]) 09/21/2018  . Hemiparesis of right dominant side ([place]) 08/17/2018  . Vestibular dizziness 01/21/2016  . Tick bite 07/15/2015  . Cerebrovascular disease 07/17/2014  . Preventative health care 06/25/2014  . Right sided sciatica 03/08/2013  . MENOPAUSAL SYNDROME 09/10/2008  . Mood disorder ([place]) 09/15/2007  . Essential hypertension, benign 08/10/2006  . Type 2 diabetes mellitus with other circulatory complications ([place]) 48/35/0757  . HYPERCHOLESTEROLEMIA 06/17/2006  . GERD 06/17/2006    Chrys Racer, OTR/L,  [facility] ascom [phone] 01/09/19, 5:39 PM  [place] MAIN [facility] SERVICES [address] [place], [place], [address] Phone: [phone]   Fax:  [phone]  Name: VENDETTA PITTINGER MRN: [id number] Date of Birth: [birth year]

## 2019-01-09 NOTE — Therapy (Signed)
[place] [facility] [address] [place], [place], [address] Phone: [phone]   Fax:  [phone]  Physical Therapy Treatment  Patient Details  Name: Tami Jones MRN: [id number] Date of Birth: 06/08/1955 Referring Provider (PT): Tillman Abideichard Letvak, MD   Encounter Date: 01/09/2019  PT End of Session - 01/09/19 1547    Visit Number  9    Number of Visits  17    Date for PT Re-Evaluation  01/07/19    Authorization - Visit Number  12    PT Start Time  1600    PT Stop Time  1640    PT Time Calculation (min)  40 min    Equipment Utilized During Treatment  Gait belt    Activity Tolerance  Patient tolerated treatment well       Past Medical History:  Diagnosis Date  . Anxiety   . Asthma   . 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 Thad Rangereynolds  . Diabetes mellitus   . GERD (gastroesophageal reflux disease)   . Hyperlipidemia   . Hypertension     Past Surgical History:  Procedure Laterality Date  . CARDIOVASCULAR STRESS TEST  1/15   myoview. EF 60%  . CESAREAN SECTION    . ESOPHAGOGASTRODUODENOSCOPY  05/2005  . TONSILLECTOMY AND ADENOIDECTOMY      There were no vitals filed for this visit.      Treatment: Nu-step x 5 mins  Tapping to 6 inch stool x 20  Side stepping left and right x 10 with RW for support; fatigue  Fwd/ bwd stepping x 10 , CGA and fatigue  Patient performed with instruction, verbal cues, tactile cues of therapist: goal: increase tissue extensibility, promote proper posture, improve mobility                      PT Education - 01/09/19 1547    Education Details  HEP    Person(s) Educated  Patient    Methods  Explanation    Comprehension  Verbalized understanding;Returned demonstration;Tactile cues required;Need further instruction       PT Short Term Goals - 11/08/18 1111      PT SHORT TERM GOAL #1   Title  Pt will stand 2" unsupported at sink  with SBA without LOB for incr. independence and safety with ADL's in standing.    Baseline  8.78 secs without UE support - 11-07-18    Time  4    Period  Weeks    Status  New    Target Date  12/07/18      PT SHORT TERM GOAL #2   Title  Improve Berg score from 13/56 to >/= 23/56 to demo improvement in standing balance.    Baseline  13/56 on 11-07-18    Time  4    Period  Weeks    Status  New    Target Date  12/07/18      PT SHORT TERM GOAL #3   Title  Improve TUG score from 26.13 secs with RW to </= 21 secs with RW for improved functional mobility and reduced fall risk.    Baseline  26.13 secs with RW -11-07-18    Time  4    Period  Weeks    Status  New    Target Date  12/07/18      PT SHORT TERM GOAL #4   Title  Incr. gait velocity from 1.97 ft/sec  with RW to >/= 2.3 ft/sec with RW for incr. gait efficiency.    Baseline  16.66 secs with RW = 1.97 ft/sec with RW    Time  4    Period  Weeks    Status  New    Target Date  12/07/18      PT SHORT TERM GOAL #5   Title  Independent in HEP for  balance & strengthening exercises.    Time  4    Period  Weeks    Status  New    Target Date  12/07/18        PT Long Term Goals - 11/08/18 1116      PT LONG TERM GOAL #1   Title  Pt will improve TUG score to >/= 16 secs with RW for reduced fall risk.    Baseline  26.13 secs with RW - 11-07-18    Time  8    Period  Weeks    Status  New    Target Date  01/07/19      PT LONG TERM GOAL #2   Title  Pt will amb. 300' without device with SBA for increased community accessibility.    Baseline  pt using RW -100' with SBA    Time  8    Period  Weeks    Status  New    Target Date  01/07/19      PT LONG TERM GOAL #3   Title  Modified independent household amb. (50') without device.    Time  8    Period  Weeks    Status  New    Target Date  01/07/19      PT LONG TERM GOAL #4   Title  Improve Berg score from 13/56 to >/ 36/56 to reduce fall risk.    Baseline  13/56 on 11-07-18     Time  8    Period  Weeks    Status  New    Target Date  01/07/19      PT LONG TERM GOAL #5   Title  Increase gait speed from 1.97 ft/sec with RW to >/= 2.7 ft/sec with RW for incr. gait efficiency.    Baseline  16.66 secs = 1.97 ft/sec with RW on 11-07-18    Time  8    Period  Weeks    Status  New            Plan - 01/09/19 1548    Clinical Impression Statement  Patient instructed in beginning balance and coordination exercise. Patient required mod VCs and min A for gait to improve weight shift and postural control. Patient requires min VCs to improve motor control  with exercises.   Patients would benefit from additional skilled PT intervention to improve balance/gait safety and reduce fall risk.    Personal Factors and Comorbidities  Fitness;Comorbidity 2;Transportation;Profession;Behavior Pattern    Comorbidities  Type 2 DM, Rt sided sciatica, h/o major depressive disorder, anxiety, cerebrovascular disease    Examination-Activity Limitations  Bathing;Locomotion Level;Transfers;Reach Overhead;Stairs;Stand;Lift;Squat    Examination-Participation Restrictions  Meal Prep;Cleaning;Community Activity;Driving;Interpersonal Relationship;Laundry;Shop    Stability/Clinical Decision Making  Evolving/Moderate complexity    Rehab Potential  Good    PT Frequency  2x / week    PT Duration  8 weeks    PT Treatment/Interventions  ADLs/Self Care Home Management;Aquatic Therapy;Gait training;Stair training;Neuromuscular re-education;DME Instruction;Balance training;Therapeutic exercise;Therapeutic activities;Patient/family education    PT Next Visit Plan  HEP for balance and RLE strengthening -  include sit to stand, standing unsupported (initiated this ex. on 11-07-18 at eval)    PT Home Exercise Plan  Added seated marches, knee extension, and heel/toe raises; see pt instructions    Consulted and Agree with Plan of Care  Patient       Patient will benefit from skilled therapeutic intervention in  order to improve the following deficits and impairments:  Abnormal gait, Decreased activity tolerance, Decreased endurance, Decreased coordination, Decreased balance, Decreased strength, Impaired sensation, Impaired UE functional use, Pain  Visit Diagnosis: Muscle weakness (generalized)  Other lack of coordination  Other abnormalities of gait and mobility  Hemiplegia and hemiparesis following cerebral infarction affecting right dominant side ([facility])  Unsteadiness on feet  Acute pain of right shoulder  Other disturbances of skin sensation  Attention and concentration deficit     Problem List Patient Active Problem List   Diagnosis Date Noted  . Right shoulder pain 12/04/2018  . Neuropathic pain, arm 10/26/2018  . MDD (major depressive disorder), single episode, moderate ([facility]) 09/21/2018  . Hemiparesis of right dominant side ([facility]) 08/17/2018  . Vestibular dizziness 01/21/2016  . Tick bite 07/15/2015  . Cerebrovascular disease 07/17/2014  . Preventative health care 06/25/2014  . Right sided sciatica 03/08/2013  . MENOPAUSAL SYNDROME 09/10/2008  . Mood disorder ([facility]) 09/15/2007  . Essential hypertension, benign 08/10/2006  . Type 2 diabetes mellitus with other circulatory complications ([facility]) 06/17/2006  . HYPERCHOLESTEROLEMIA 06/17/2006  . GERD 06/17/2006    Ezekiel Ina, PT DPT 01/09/2019, 3:49 PM  [facility] MAIN [facility] SERVICES [address] [place], [place], [address] Phone: [phone]   Fax:  [phone]  Name: Tami Jones MRN: [id number] Date of Birth: 1955-10-24

## 2019-01-15 ENCOUNTER — Encounter: Payer: Self-pay | Admitting: Family Medicine

## 2019-01-15 ENCOUNTER — Other Ambulatory Visit: Payer: Self-pay

## 2019-01-15 ENCOUNTER — Ambulatory Visit: Payer: Managed Care, Other (non HMO) | Admitting: Family Medicine

## 2019-01-15 VITALS — BP 124/80 | HR 97 | Temp 97.2°F | Ht 63.0 in | Wt 241.0 lb

## 2019-01-15 DIAGNOSIS — R319 Hematuria, unspecified: Secondary | ICD-10-CM | POA: Diagnosis not present

## 2019-01-15 DIAGNOSIS — R300 Dysuria: Secondary | ICD-10-CM | POA: Diagnosis not present

## 2019-01-15 DIAGNOSIS — R310 Gross hematuria: Secondary | ICD-10-CM | POA: Insufficient documentation

## 2019-01-15 LAB — POCT URINALYSIS DIPSTICK
Bilirubin, UA: NEGATIVE
Blood, UA: NEGATIVE
Glucose, UA: NEGATIVE
Ketones, UA: NEGATIVE
Leukocytes, UA: NEGATIVE
Nitrite, UA: NEGATIVE
Protein, UA: NEGATIVE
Spec Grav, UA: >=1.03 — AB (ref 1.010–1.025)
Urobilinogen, UA: 0.2 E.U./dL
pH, UA: 5.5 (ref 5.0–8.0)

## 2019-01-15 MED ORDER — CEPHALEXIN 500 MG PO CAPS: 500.0000 mg | ORAL_CAPSULE | Freq: Two times a day (BID) | ORAL | 0 refills | Status: DC

## 2019-01-15 NOTE — Patient Instructions (Addendum)
Please try to drink lots of water Your urine is concentrated  I sent it for a culture to see if there is an infection  In the meantime-take the keflex as directed    I placed a urology appointment as well for recurrent blood in urine   If symptoms suddenly worsen in the meantime let us know

## 2019-01-15 NOTE — Assessment & Plan Note (Signed)
With intermittent gross hematuria and h/o utis  Worse frequency /urgency since last stroke as well  cx pending  Ref to urology

## 2019-01-15 NOTE — Progress Notes (Signed)
Subjective:    Patient ID: Tami Jones, female    DOB: 01/24/1956, 63 y.o.   MRN: [id number]  HPI Here for urinary symptoms   Blood in urine intermittently since the thanksgiving  Pink on toilet tissue  Bright red in the toilet  Today -feels off in general   Had uti in June and then sept  Often sees blood   She urinates frequently   This week - incontinence and frequency and urgency Cannot hold urine well since she had a stroke  ? If emptying   Bladder feels full and heavy  Some dysuria as well   Some L low back pain-unsure if related  No flank pain   Trying to drink water-makes her a little nausea  No fever     Results for orders placed or performed in visit on 01/15/19  POCT urinalysis dipstick  Result Value Ref Range   Color, UA yellow    Clarity, UA clear    Glucose, UA Negative Negative   Bilirubin, UA Negative    Ketones, UA Negative    Spec Grav, UA >=1.030 (A) 1.010 - 1.025   Blood, UA Negative    pH, UA 5.5 5.0 - 8.0   Protein, UA Negative Negative   Urobilinogen, UA 0.2 0.2 or 1.0 E.U./dL   Nitrite, UA Negative    Leukocytes, UA Negative Negative   Appearance     Odor      Last urine culture in sept showed e coli with multi drug resistance  Patient Active Problem List   Diagnosis Date Noted  . Gross hematuria 01/15/2019  . Dysuria 01/15/2019  . Right shoulder pain 12/04/2018  . Neuropathic pain, arm 10/26/2018  . MDD (major depressive disorder), single episode, moderate (HCC) 09/21/2018  . Hemiparesis of right dominant side (HCC) 08/17/2018  . Vestibular dizziness 01/21/2016  . Tick bite 07/15/2015  . Cerebrovascular disease 07/17/2014  . Preventative health care 06/25/2014  . Right sided sciatica 03/08/2013  . MENOPAUSAL SYNDROME 09/10/2008  . Mood disorder (HCC) 09/15/2007  . Essential hypertension, benign 08/10/2006  . Type 2 diabetes mellitus with other circulatory complications (HCC) 06/17/2006  . HYPERCHOLESTEROLEMIA 06/17/2006   . GERD 06/17/2006   Past Medical History:  Diagnosis Date  . Anxiety   . Asthma   . 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 Thad Rangereynolds  . Diabetes mellitus   . GERD (gastroesophageal reflux disease)   . Hyperlipidemia   . Hypertension    Past Surgical History:  Procedure Laterality Date  . CARDIOVASCULAR STRESS TEST  1/15   myoview. EF 60%  . CESAREAN SECTION    . ESOPHAGOGASTRODUODENOSCOPY  05/2005  . TONSILLECTOMY AND ADENOIDECTOMY     Social History   Tobacco Use  . Smoking status: Never Smoker  . Smokeless tobacco: Never Used  Substance Use Topics  . Alcohol use: No    Alcohol/week: 0.0 standard drinks    Comment: wine occassionally  . Drug use: No   Family History  Problem Relation Age of Onset  . Coronary artery disease Mother   . Diabetes Mellitus II Mother   . Heart attack Mother [id number]  . Diabetes Mellitus II Maternal Grandmother    Allergies  Allergen Reactions  . Citalopram Other (See Comments)    Feels odd  . Doxycycline Hyclate Nausea And Vomiting  . Erythromycin Other (See Comments)    Irritated stomach  . Montelukast Sodium Itching  . Nitrofurantoin Nausea And Vomiting  .  Sulfa Antibiotics Nausea And Vomiting  . Tramadol Hcl Other (See Comments)    Feels odd  . Venlafaxine Hcl Other (See Comments)    Feels odd  . Amoxicillin-Pot Clavulanate Nausea And Vomiting  . Clarithromycin Rash     See ER note 02/2017   Current Outpatient Medications on File Prior to Visit  Medication Sig Dispense Refill  . amLODipine (NORVASC) 5 MG tablet Take 5 mg by mouth 2 (two) times daily.    Marland Kitchen aspirin EC 81 MG tablet Take 81 mg by mouth daily.    Marland Kitchen atorvastatin (LIPITOR) 80 MG tablet Take 1 tablet (80 mg total) by mouth daily. 90 tablet 3  . citalopram (CELEXA) 20 MG tablet Take 1 tablet (20 mg total) by mouth daily. 90 tablet 3  . clopidogrel (PLAVIX) 75 MG tablet Take 1 tablet by mouth daily.    Marland Kitchen gabapentin (NEURONTIN) 300 MG  capsule Take 1 capsule (300 mg total) by mouth at bedtime. Increase to 2 capsules in ineffective. 60 capsule 3  . glipiZIDE (GLUCOTROL XL) 5 MG 24 hr tablet TAKE 1 TABLET BY MOUTH EVERYDAY WITH BREAKFAST 90 tablet 3  . lansoprazole (PREVACID) 30 MG capsule Take 1 capsule (30 mg total) by mouth daily at 12 noon. 30 capsule 11  . lisinopril (PRINIVIL,ZESTRIL) 20 MG tablet TAKE 1 TABLET BY MOUTH DAILY 30 tablet 10  . nystatin (MYCOSTATIN/NYSTOP) powder APPLY EXTERNALLY TO THE AFFECTED AREA TWICE DAILY 60 g 3   No current facility-administered medications on file prior to visit.      Review of Systems  Constitutional: Negative for activity change, appetite change, fatigue, fever and unexpected weight change.  HENT: Negative for congestion, ear pain, rhinorrhea, sinus pressure and sore throat.   Eyes: Negative for pain, redness, itching and visual disturbance.  Respiratory: Negative for cough, shortness of breath and wheezing.   Cardiovascular: Negative for chest pain, palpitations and leg swelling.  Gastrointestinal: Positive for nausea. Negative for abdominal distention, abdominal pain, blood in stool, constipation, diarrhea and vomiting.  Endocrine: Negative for cold intolerance, polydipsia and polyuria.  Genitourinary: Positive for frequency, hematuria and urgency. Negative for decreased urine volume, difficulty urinating, flank pain and pelvic pain.  Musculoskeletal: Negative for arthralgias, back pain and myalgias.       Mild L low back pain  Skin: Negative for pallor and rash.  Allergic/Immunologic: Negative for environmental allergies and immunocompromised state.  Neurological: Negative for dizziness, syncope, weakness and headaches.  Hematological: Negative for adenopathy. Does not bruise/bleed easily.  Psychiatric/Behavioral: Negative for decreased concentration and dysphoric mood. The patient is not nervous/anxious.        Objective:   Physical Exam Constitutional:      General:  She is not in acute distress.    Appearance: Normal appearance. She is well-developed. She is obese. She is not ill-appearing.  HENT:     Head: Normocephalic and atraumatic.     Mouth/Throat:     Mouth: Mucous membranes are moist.  Eyes:     Conjunctiva/sclera: Conjunctivae normal.     Pupils: Pupils are equal, round, and reactive to light.  Neck:     Musculoskeletal: Normal range of motion and neck supple.  Cardiovascular:     Rate and Rhythm: Normal rate and regular rhythm.     Heart sounds: Normal heart sounds.  Pulmonary:     Effort: Pulmonary effort is normal.     Breath sounds: Normal breath sounds.  Abdominal:     General: Bowel sounds are normal.  There is no distension.     Palpations: Abdomen is soft.     Tenderness: There is abdominal tenderness. There is no right CVA tenderness, left CVA tenderness or rebound.     Comments: No cva tenderness  Mild suprapubic tenderness (exam sitting)  Lymphadenopathy:     Cervical: No cervical adenopathy.  Skin:    Findings: No rash.  Neurological:     Mental Status: She is alert.     Motor: Weakness present.  Psychiatric:        Mood and Affect: Mood is depressed.     Comments: Depressed affect  Tearful at times            Assessment & Plan:   Problem List Items Addressed This Visit      Genitourinary   Gross hematuria - Primary    This is recurrent  ua is very bland today but again concentrated Counseled re: hydration /water intake  cx planned Empirically cover with keflex (which she tolerates well)  Ref to urology  Enc to call if symptoms suddenly worsen       Relevant Orders   Urine Culture   Ambulatory referral to Urology     Other   Dysuria    With intermittent gross hematuria and h/o utis  Worse frequency /urgency since last stroke as well  cx pending  Ref to urology      Relevant Orders   POCT urinalysis dipstick (Completed)   Urine Culture   Ambulatory referral to Urology    Other Visit  Diagnoses    Hematuria, unspecified type       Relevant Orders   POCT urinalysis dipstick (Completed)

## 2019-01-15 NOTE — Assessment & Plan Note (Signed)
This is recurrent  ua is very bland today but again concentrated Counseled re: hydration /water intake  cx planned Empirically cover with keflex (which she tolerates well)  Ref to urology  Enc to call if symptoms suddenly worsen

## 2019-01-16 ENCOUNTER — Ambulatory Visit: Payer: Managed Care, Other (non HMO) | Admitting: Physical Therapy

## 2019-01-16 ENCOUNTER — Encounter: Payer: Managed Care, Other (non HMO) | Admitting: Occupational Therapy

## 2019-01-17 LAB — URINE CULTURE
MICRO NUMBER:: 1146070
Result:: NO GROWTH
SPECIMEN QUALITY:: ADEQUATE

## 2019-01-18 ENCOUNTER — Ambulatory Visit: Payer: Managed Care, Other (non HMO) | Admitting: Occupational Therapy

## 2019-01-18 ENCOUNTER — Ambulatory Visit: Payer: Managed Care, Other (non HMO) | Admitting: Physical Therapy

## 2019-01-19 ENCOUNTER — Encounter: Payer: Self-pay | Admitting: Urology

## 2019-01-19 ENCOUNTER — Other Ambulatory Visit: Payer: Self-pay

## 2019-01-19 ENCOUNTER — Ambulatory Visit: Payer: Managed Care, Other (non HMO) | Admitting: Urology

## 2019-01-19 VITALS — BP 149/86 | HR 85 | Ht 64.0 in | Wt 239.0 lb

## 2019-01-19 DIAGNOSIS — N950 Postmenopausal bleeding: Secondary | ICD-10-CM

## 2019-01-19 DIAGNOSIS — R310 Gross hematuria: Secondary | ICD-10-CM

## 2019-01-19 DIAGNOSIS — N3941 Urge incontinence: Secondary | ICD-10-CM

## 2019-01-19 DIAGNOSIS — N3281 Overactive bladder: Secondary | ICD-10-CM

## 2019-01-19 NOTE — Progress Notes (Signed)
01/19/2019 9:58 AM   Tami Jones 01/31/1956 [id number]  Referring provider: Karie SchwalbeLetvak, Richard I, MD [address] [place],  [place] [id number]  Chief Complaint  Patient presents with  . Hematuria    New Patient    HPI: 63 year old female referred for further evaluation of possible gross hematuria as well as urinary urgency and frequency.  She reports that she had a stroke earlier this year and ever since, has been experiencing severe urinary urgency, frequency, and almost daily episodes of urge incontinence.  This is very bothersome to her.  She is not yet ready medications for this.  Prior to her stroke, she did not have this issue.  She also was treated for urinary tract infection twice this year, once on 07/2018 growing Klebsiella pneumonia and of more recently 10/2018 growing E. coli.  Her urinary symptoms at the time were consistent with infection including worsening urinary frequency urgency but also associated dysuria.  No fevers or chills.  No flank pain.  She reports today over the past month or so, she has been having intermittent episodes of bleeding.  She is somewhat of a difficult historian regarding this, reports that she sees it on her toilet tissue, streaks in the toilet which sound like small clots but the bowl itself is not red and the urine itself is not red-tinged or pink.  She is unsure whether it is coming from her bladder or her vagina.  She is postmenopausal.  She still has a uterus.  She reports that she had a normal Pap about a year ago.  She has a remote history of a C-section.  She also reports that she has been having some midline low back pain which is chronic.  It improves with rest.  It is exacerbated with activity.  She was seen a couple days ago by her primary care physician at which time she dropped off her urine.  Her urine contained no microscopic blood and urine culture was negative.  She was prescribed empiric Keflex despite the negative  urine.   PMH: Past Medical History:  Diagnosis Date  . Anxiety   . Asthma   . 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 Thad Rangereynolds  . Diabetes mellitus   . GERD (gastroesophageal reflux disease)   . Hyperlipidemia   . Hypertension     Surgical History: Past Surgical History:  Procedure Laterality Date  . CARDIOVASCULAR STRESS TEST  1/15   myoview. EF 60%  . CESAREAN SECTION    . ESOPHAGOGASTRODUODENOSCOPY  05/2005  . TONSILLECTOMY AND ADENOIDECTOMY      Home Medications:  Allergies as of 01/19/2019      Reactions   Citalopram Other (See Comments)   Feels odd   Doxycycline Hyclate Nausea And Vomiting   Erythromycin Other (See Comments)   Irritated stomach   Montelukast Sodium Itching   Nitrofurantoin Nausea And Vomiting   Sulfa Antibiotics Nausea And Vomiting   Tramadol Hcl Other (See Comments)   Feels odd   Venlafaxine Hcl Other (See Comments)   Feels odd   Amoxicillin-pot Clavulanate Nausea And Vomiting   Clarithromycin Rash    See ER note 02/2017      Medication List       Accurate as of January 19, 2019  9:58 AM. If you have any questions, ask your nurse or doctor.        STOP taking these medications   ciprofloxacin 250 MG tablet Commonly known as: CIPRO  Stopped by: Hollice Espy, MD     TAKE these medications   amLODipine 5 MG tablet Commonly known as: NORVASC Take 5 mg by mouth 2 (two) times daily.   aspirin EC 81 MG tablet Take 81 mg by mouth daily.   atorvastatin 80 MG tablet Commonly known as: Lipitor Take 1 tablet (80 mg total) by mouth daily.   cephALEXin 500 MG capsule Commonly known as: KEFLEX Take 1 capsule (500 mg total) by mouth 2 (two) times daily.   citalopram 20 MG tablet Commonly known as: CELEXA Take 1 tablet (20 mg total) by mouth daily.   clopidogrel 75 MG tablet Commonly known as: PLAVIX Take 1 tablet by mouth daily.   gabapentin 300 MG capsule Commonly known as: NEURONTIN Take 1  capsule (300 mg total) by mouth at bedtime. Increase to 2 capsules in ineffective.   glipiZIDE 5 MG 24 hr tablet Commonly known as: GLUCOTROL XL TAKE 1 TABLET BY MOUTH EVERYDAY WITH BREAKFAST   lansoprazole 30 MG capsule Commonly known as: PREVACID Take 1 capsule (30 mg total) by mouth daily at 12 noon.   lisinopril 20 MG tablet Commonly known as: ZESTRIL TAKE 1 TABLET BY MOUTH DAILY   nystatin powder Commonly known as: MYCOSTATIN/NYSTOP APPLY EXTERNALLY TO THE AFFECTED AREA TWICE DAILY       Allergies:  Allergies  Allergen Reactions  . Citalopram Other (See Comments)    Feels odd  . Doxycycline Hyclate Nausea And Vomiting  . Erythromycin Other (See Comments)    Irritated stomach  . Montelukast Sodium Itching  . Nitrofurantoin Nausea And Vomiting  . Sulfa Antibiotics Nausea And Vomiting  . Tramadol Hcl Other (See Comments)    Feels odd  . Venlafaxine Hcl Other (See Comments)    Feels odd  . Amoxicillin-Pot Clavulanate Nausea And Vomiting  . Clarithromycin Rash     See ER note 02/2017    Family History: Family History  Problem Relation Age of Onset  . Coronary artery disease Mother   . Diabetes Mellitus II Mother   . Heart attack Mother 62  . Diabetes Mellitus II Maternal Grandmother     Social History:  reports that she has never smoked. She has never used smokeless tobacco. She reports that she does not drink alcohol or use drugs.  ROS: UROLOGY Frequent Urination?: Yes Hard to postpone urination?: No Burning/pain with urination?: No Get up at night to urinate?: No Leakage of urine?: No Urine stream starts and stops?: No Trouble starting stream?: No Do you have to strain to urinate?: No Blood in urine?: No Urinary tract infection?: No Sexually transmitted disease?: No Injury to kidneys or bladder?: No Painful intercourse?: No Weak stream?: No Currently pregnant?: No Vaginal bleeding?: Yes Last menstrual period?: n  Gastrointestinal Nausea?:  No Vomiting?: No Indigestion/heartburn?: No Diarrhea?: No Constipation?: No  Constitutional Fever: No Night sweats?: No Weight loss?: No Fatigue?: No  Skin Skin rash/lesions?: No Itching?: No  Eyes Blurred vision?: No Double vision?: No  Ears/Nose/Throat Sore throat?: No Sinus problems?: No  Hematologic/Lymphatic Swollen glands?: No Easy bruising?: No  Cardiovascular Leg swelling?: No Chest pain?: No  Respiratory Cough?: No Shortness of breath?: No  Endocrine Excessive thirst?: No  Musculoskeletal Back pain?: No Joint pain?: No  Neurological Headaches?: No Dizziness?: No  Psychologic Depression?: No Anxiety?: No  Physical Exam: BP (!) 149/86   Pulse 85   Ht 5\' 4"  (1.626 m)   Wt 239 lb (108.4 kg)   BMI 41.02 kg/m  Constitutional:  Alert and oriented, No acute distress. HEENT: [place] AT, moist mucus membranes.  Trachea midline, no masses. Cardiovascular: No clubbing, cyanosis, or edema. Respiratory: Normal respiratory effort, no increased work of breathing. GI: Abdomen is soft, nontender, nondistended, no abdominal masses GU: No CVA tenderness Skin: No rashes, bruises or suspicious lesions. Neurologic: Grossly intact, no focal deficits, moving all 4 extremities. Psychiatric: Normal mood and affect.  Laboratory Data: Lab Results  Component Value Date   WBC 5.2 08/05/2018   HGB 12.7 08/05/2018   HCT 39.0 08/05/2018   MCV 92.2 08/05/2018   PLT 197 08/05/2018    Lab Results  Component Value Date   CREATININE 0.98 08/05/2018     Lab Results  Component Value Date   HGBA1C 7.7 (A) 12/04/2018    Urinalysis    Component Value Date/Time   COLORURINE YELLOW (A) 08/03/2018 1927   APPEARANCEUR HAZY (A) 08/03/2018 1927   LABSPEC 1.014 08/03/2018 1927   PHURINE 6.0 08/03/2018 1927   GLUCOSEU >=500 (A) 08/03/2018 1927   HGBUR NEGATIVE 08/03/2018 1927   HGBUR negative 02/05/2010 1336   BILIRUBINUR Negative 01/15/2019 1508   KETONESUR  NEGATIVE 08/03/2018 1927   PROTEINUR Negative 01/15/2019 1508   PROTEINUR NEGATIVE 08/03/2018 1927   UROBILINOGEN 0.2 01/15/2019 1508   UROBILINOGEN 0.2 02/05/2010 1336   NITRITE Negative 01/15/2019 1508   NITRITE NEGATIVE 08/03/2018 1927   LEUKOCYTESUR Negative 01/15/2019 1508   LEUKOCYTESUR TRACE (A) 08/03/2018 1927    Lab Results  Component Value Date   LABMICR 11.5 02/17/2017   BACTERIA MANY (A) 08/03/2018    Pertinent Imaging:  Results for orders placed during the hospital encounter of 04/29/16  CT RENAL STONE STUDY   Narrative CLINICAL DATA:  Left flank pain with nausea for 2-3 days.  EXAM: CT ABDOMEN AND PELVIS WITHOUT CONTRAST  TECHNIQUE: Multidetector CT imaging of the abdomen and pelvis was performed following the standard protocol without IV contrast.  COMPARISON:  11/28/2015  FINDINGS: Lower chest: The visualized lung bases are clear.  Hepatobiliary: No focal liver abnormality is seen. No gallstones, gallbladder wall thickening, or biliary dilatation.  Pancreas: Unremarkable.  Spleen: Unremarkable.  Adrenals/Urinary Tract: Unremarkable adrenal glands. There is an extrarenal pelvis on the right. Right pelviectasis and mild proximal right ureteral dilatation on the prior study have decreased. There is no evidence of renal mass, calculi, or hydronephrosis. There is asymmetric left renal atrophy. The bladder is unremarkable.  Stomach/Bowel: The stomach is within normal limits. There is no evidence of bowel obstruction or inflammation. The appendix is unremarkable.  Vascular/Lymphatic: Moderate abdominal aortic atherosclerosis without aneurysm. Mild mesenteric haziness and small mesenteric lymph nodes do not appear significantly changed.  Reproductive: Uterus and bilateral adnexa are unremarkable.  Other: No intraperitoneal free fluid. No abdominal wall mass or hernia.  Musculoskeletal: Lower lumbar facet arthrosis, most advanced at L4-5 where  there is slight anterolisthesis.  IMPRESSION: No acute abnormality identified in the abdomen or pelvis.   Electronically Signed   By: Sebastian Ache M.D.   On: 04/29/2016 15:58    Results for orders placed or performed in visit on 01/15/19  Urine Culture   Specimen: Urine  Result Value Ref Range   MICRO NUMBER: [id number]    SPECIMEN QUALITY: Adequate    Sample Source URINE    STATUS: FINAL    Result: No Growth   POCT urinalysis dipstick  Result Value Ref Range   Color, UA yellow    Clarity, UA clear    Glucose, UA  Negative Negative   Bilirubin, UA Negative    Ketones, UA Negative    Spec Grav, UA >=1.030 (A) 1.010 - 1.025   Blood, UA Negative    pH, UA 5.5 5.0 - 8.0   Protein, UA Negative Negative   Urobilinogen, UA 0.2 0.2 or 1.0 E.U./dL   Nitrite, UA Negative    Leukocytes, UA Negative Negative   Appearance     Odor       Assessment & Plan:    1. Gross hematuria Based on her history as well as multiple negative urinalysis, suspect that this is likely postmenopausal bleeding within gross hematuria  We will hold off on further evaluation at this time, referral to OB/GYN  If she does not fact have either blood mixed in with her urine in the toilet scopic hematuria, will consider hematuria evaluation at that time.  She is agreeable this plan.  2. OAB (overactive bladder) Likely related to stroke   Samples of Myrbetriq 25 mg given to the patient, she will let us know if effective  3. Urge incontinence As above  4. Postmenopausal vaginal bleeding - Ambulatory referral to Obstetrics / Gynecology   Vanna Scotland, MD  [facility] [address], [address] [place], [place] [address] [phone]

## 2019-01-22 ENCOUNTER — Telehealth: Payer: Self-pay | Admitting: Obstetrics & Gynecology

## 2019-01-22 NOTE — Telephone Encounter (Signed)
[facility] referring for Postmenopausal vaginal bleeding. Voicemail box is full unable to leave message

## 2019-01-23 ENCOUNTER — Ambulatory Visit: Payer: Managed Care, Other (non HMO) | Admitting: Occupational Therapy

## 2019-01-23 ENCOUNTER — Ambulatory Visit: Payer: Managed Care, Other (non HMO) | Admitting: Physical Therapy

## 2019-01-23 NOTE — Telephone Encounter (Signed)
Voicemail box is full - unable to leave message

## 2019-01-25 ENCOUNTER — Ambulatory Visit: Payer: Managed Care, Other (non HMO)

## 2019-01-25 ENCOUNTER — Ambulatory Visit: Payer: Managed Care, Other (non HMO) | Admitting: Occupational Therapy

## 2019-01-26 NOTE — Telephone Encounter (Signed)
Voicemail box is full - unable to leave message

## 2019-01-30 ENCOUNTER — Ambulatory Visit: Payer: Managed Care, Other (non HMO) | Admitting: Physical Therapy

## 2019-01-30 ENCOUNTER — Ambulatory Visit: Payer: Managed Care, Other (non HMO) | Admitting: Occupational Therapy

## 2019-01-31 ENCOUNTER — Telehealth: Payer: Self-pay | Admitting: Internal Medicine

## 2019-01-31 NOTE — Telephone Encounter (Signed)
[employer] faxed disability management paperwork In dr Alla German in box

## 2019-02-01 ENCOUNTER — Ambulatory Visit: Payer: Managed Care, Other (non HMO)

## 2019-02-01 ENCOUNTER — Encounter: Payer: Managed Care, Other (non HMO) | Admitting: Occupational Therapy

## 2019-02-01 NOTE — Telephone Encounter (Signed)
Paperwork faxed °

## 2019-02-01 NOTE — Telephone Encounter (Signed)
Form finished  thanks

## 2019-02-06 ENCOUNTER — Ambulatory Visit: Payer: Managed Care, Other (non HMO) | Admitting: Physical Therapy

## 2019-02-06 ENCOUNTER — Encounter: Payer: Managed Care, Other (non HMO) | Admitting: Occupational Therapy

## 2019-02-07 NOTE — Telephone Encounter (Signed)
Copy for pt °Copy for scan °

## 2019-02-13 ENCOUNTER — Encounter: Payer: Managed Care, Other (non HMO) | Admitting: Occupational Therapy

## 2019-02-13 ENCOUNTER — Ambulatory Visit: Payer: Managed Care, Other (non HMO) | Admitting: Physical Therapy

## 2019-02-15 ENCOUNTER — Other Ambulatory Visit: Payer: Self-pay | Admitting: Pulmonary Disease

## 2019-02-15 ENCOUNTER — Encounter: Payer: Managed Care, Other (non HMO) | Admitting: Occupational Therapy

## 2019-02-15 ENCOUNTER — Ambulatory Visit: Payer: Managed Care, Other (non HMO) | Admitting: Physical Therapy

## 2019-02-15 DIAGNOSIS — R05 Cough: Secondary | ICD-10-CM

## 2019-02-15 DIAGNOSIS — R0609 Other forms of dyspnea: Secondary | ICD-10-CM

## 2019-02-15 DIAGNOSIS — R053 Chronic cough: Secondary | ICD-10-CM

## 2019-02-20 ENCOUNTER — Encounter: Payer: Managed Care, Other (non HMO) | Admitting: Occupational Therapy

## 2019-02-20 ENCOUNTER — Other Ambulatory Visit: Payer: Self-pay

## 2019-02-20 ENCOUNTER — Ambulatory Visit: Payer: Managed Care, Other (non HMO) | Admitting: Physical Therapy

## 2019-02-20 DIAGNOSIS — R310 Gross hematuria: Secondary | ICD-10-CM

## 2019-02-22 ENCOUNTER — Ambulatory Visit: Payer: Managed Care, Other (non HMO) | Admitting: Physical Therapy

## 2019-02-23 ENCOUNTER — Other Ambulatory Visit: Payer: Managed Care, Other (non HMO) | Admitting: Urology

## 2019-02-27 ENCOUNTER — Ambulatory Visit: Payer: Self-pay | Admitting: Urology

## 2019-02-27 ENCOUNTER — Encounter: Payer: Managed Care, Other (non HMO) | Admitting: Occupational Therapy

## 2019-03-01 ENCOUNTER — Encounter: Payer: Managed Care, Other (non HMO) | Admitting: Occupational Therapy

## 2019-03-01 ENCOUNTER — Ambulatory Visit: Payer: Managed Care, Other (non HMO) | Admitting: Physical Therapy

## 2019-03-01 NOTE — Telephone Encounter (Signed)
Pt called stating she needs end date. She wanted you to put 03/16/2019. She know she probably won't be able to go back to work , but [place] needed a date.  Please initial and date changes

## 2019-03-01 NOTE — Telephone Encounter (Signed)
Okay I initialed and dated

## 2019-03-02 NOTE — Telephone Encounter (Signed)
Paperwork faxed °

## 2019-03-06 ENCOUNTER — Encounter: Payer: Managed Care, Other (non HMO) | Admitting: Occupational Therapy

## 2019-03-06 ENCOUNTER — Ambulatory Visit: Payer: Managed Care, Other (non HMO) | Admitting: Physical Therapy

## 2019-03-07 NOTE — Telephone Encounter (Signed)
Pt aware  °Copy for pt °Copy for scan °

## 2019-03-08 ENCOUNTER — Encounter: Payer: Self-pay | Admitting: Internal Medicine

## 2019-03-08 ENCOUNTER — Other Ambulatory Visit: Payer: Self-pay

## 2019-03-08 ENCOUNTER — Ambulatory Visit: Payer: Managed Care, Other (non HMO) | Admitting: Physical Therapy

## 2019-03-08 ENCOUNTER — Ambulatory Visit (INDEPENDENT_AMBULATORY_CARE_PROVIDER_SITE_OTHER): Payer: 59 | Admitting: Internal Medicine

## 2019-03-08 VITALS — BP 172/84 | HR 114 | Temp 97.7°F | Resp 20 | Wt 246.0 lb

## 2019-03-08 DIAGNOSIS — R079 Chest pain, unspecified: Secondary | ICD-10-CM | POA: Diagnosis not present

## 2019-03-08 DIAGNOSIS — I69351 Hemiplegia and hemiparesis following cerebral infarction affecting right dominant side: Secondary | ICD-10-CM

## 2019-03-08 DIAGNOSIS — R0602 Shortness of breath: Secondary | ICD-10-CM | POA: Diagnosis not present

## 2019-03-08 DIAGNOSIS — F321 Major depressive disorder, single episode, moderate: Secondary | ICD-10-CM | POA: Diagnosis not present

## 2019-03-08 NOTE — Assessment & Plan Note (Signed)
Goes back some time Suggested she go back to the pulmonologist

## 2019-03-08 NOTE — Assessment & Plan Note (Signed)
Has improved but still disabled

## 2019-03-08 NOTE — Progress Notes (Signed)
Subjective:    Patient ID: Tami Jones, female    DOB: [birth year], 64 y.o.   MRN: [id number]  HPI Here for follow up after stroke---but also with chest pain  Has been having trouble with her breathing for a while Did see Dr Karna Christmas at [place] Had pulmonary function--no clear diagnosis  Has some chest pain as well--especially when lying down Mostly left posterior axillary line No fever No sig cough  Done with therapy Made some progress Still with right arm pain---gabapentin some help Limited mobility Still limited function---can't pick up any significant weight Just retired from [employer]----has SSI disability  Current Outpatient Medications on File Prior to Visit  Medication Sig Dispense Refill  . aspirin EC 81 MG tablet Take 81 mg by mouth daily.    Marland Kitchen atorvastatin (LIPITOR) 80 MG tablet Take 1 tablet (80 mg total) by mouth daily. 90 tablet 3  . clopidogrel (PLAVIX) 75 MG tablet Take 1 tablet by mouth daily.    Marland Kitchen gabapentin (NEURONTIN) 300 MG capsule Take 1 capsule (300 mg total) by mouth at bedtime. Increase to 2 capsules in ineffective. 60 capsule 3  . glipiZIDE (GLUCOTROL XL) 5 MG 24 hr tablet TAKE 1 TABLET BY MOUTH EVERYDAY WITH BREAKFAST 90 tablet 3  . lansoprazole (PREVACID) 30 MG capsule Take 1 capsule (30 mg total) by mouth daily at 12 noon. 30 capsule 11  . lisinopril (PRINIVIL,ZESTRIL) 20 MG tablet TAKE 1 TABLET BY MOUTH DAILY 30 tablet 10  . nystatin (MYCOSTATIN/NYSTOP) powder APPLY EXTERNALLY TO THE AFFECTED AREA TWICE DAILY 60 g 3  . cephALEXin (KEFLEX) 500 MG capsule Take 1 capsule (500 mg total) by mouth 2 (two) times daily. 14 capsule 0  . citalopram (CELEXA) 20 MG tablet Take 1 tablet (20 mg total) by mouth daily. (Patient not taking: Reported on 03/08/2019) 90 tablet 3   No current facility-administered medications on file prior to visit.    Allergies  Allergen Reactions  . Citalopram Other (See Comments)    Feels odd  . Doxycycline Hyclate  Nausea And Vomiting  . Erythromycin Other (See Comments)    Irritated stomach  . Montelukast Sodium Itching  . Nitrofurantoin Nausea And Vomiting  . Sulfa Antibiotics Nausea And Vomiting  . Tramadol Hcl Other (See Comments)    Feels odd  . Venlafaxine Hcl Other (See Comments)    Feels odd  . Amoxicillin-Pot Clavulanate Nausea And Vomiting  . Clarithromycin Rash     See ER note 02/2017    Past Medical History:  Diagnosis Date  . Anxiety   . Asthma   . 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 Thad Ranger  . Diabetes mellitus   . GERD (gastroesophageal reflux disease)   . Hyperlipidemia   . Hypertension     Past Surgical History:  Procedure Laterality Date  . CARDIOVASCULAR STRESS TEST  1/15   myoview. EF 60%  . CESAREAN SECTION    . ESOPHAGOGASTRODUODENOSCOPY  05/2005  . TONSILLECTOMY AND ADENOIDECTOMY      Family History  Problem Relation Age of Onset  . Coronary artery disease Mother   . Diabetes Mellitus II Mother   . Heart attack Mother 52  . Diabetes Mellitus II Maternal Grandmother     Social History   Socioeconomic History  . Marital status: Divorced    Spouse name: Not on file  . Number of children: 2  . Years of education: Not on file  . Highest education level: Not on file  Occupational History  . Occupation: LAB TECH    Employer: [employer]  Tobacco Use  . Smoking status: Never Smoker  . Smokeless tobacco: Never Used  Substance and Sexual Activity  . Alcohol use: No    Alcohol/week: 0.0 standard drinks    Comment: wine occassionally  . Drug use: No  . Sexual activity: Yes    Birth control/protection: Condom  Other Topics Concern  . Not on file  Social History Narrative  . Not on file   Social Determinants of Health   Financial Resource Strain:   . Difficulty of Paying Living Expenses: Not on file  Food Insecurity:   . Worried About Charity fundraiser in the Last Year: Not on file  . Ran Out of Food in the Last  Year: Not on file  Transportation Needs:   . Lack of Transportation (Medical): Not on file  . Lack of Transportation (Non-Medical): Not on file  Physical Activity:   . Days of Exercise per Week: Not on file  . Minutes of Exercise per Session: Not on file  Stress:   . Feeling of Stress : Not on file  Social Connections:   . Frequency of Communication with Friends and Family: Not on file  . Frequency of Social Gatherings with Friends and Family: Not on file  . Attends Religious Services: Not on file  . Active Member of Clubs or Organizations: Not on file  . Attends Archivist Meetings: Not on file  . Marital Status: Not on file  Intimate Partner Violence:   . Fear of Current or Ex-Partner: Not on file  . Emotionally Abused: Not on file  . Physically Abused: Not on file  . Sexually Abused: Not on file   Review of Systems Arms and legs hurt Does walk on the driveway with 2 wheeled walker Sugars okay---trying to watch her eating Some belching--is taking acid blocker (but not on empty stomach)--discussed    Objective:   Physical Exam  Constitutional: No distress.  Neck: No thyromegaly present.  Cardiovascular: Normal rate, regular rhythm and normal heart sounds. Exam reveals no gallop.  No murmur heard. Respiratory: Effort normal and breath sounds normal. No respiratory distress. She has no wheezes. She has no rales.  Localized pain along left posterior ribs  Lymphadenopathy:    She has no cervical adenopathy.  Psychiatric:  Somewhat anxious           Assessment & Plan:

## 2019-03-08 NOTE — Assessment & Plan Note (Signed)
Still feels depressed Not able to work since the stroke, at home due to COVID, etc etc "I am heartbroken about all this" Discussed getting the vaccine and then she can fairly safely resume some normal activities

## 2019-03-08 NOTE — Assessment & Plan Note (Signed)
Mostly seems in the posterior left ribs Not severe enough to be concerned about fracture (and no trauma) EKG shows sinus at 96. Normal axis and intervals. No hypertrophy or ischemia. No changes since 08/07/18 Discussed that it doesn't seem cardiac

## 2019-03-11 ENCOUNTER — Other Ambulatory Visit: Payer: Self-pay

## 2019-03-11 ENCOUNTER — Encounter: Payer: Self-pay | Admitting: Intensive Care

## 2019-03-11 ENCOUNTER — Emergency Department: Payer: 59

## 2019-03-11 ENCOUNTER — Emergency Department
Admission: EM | Admit: 2019-03-11 | Discharge: 2019-03-11 | Disposition: A | Discharge status: Home / Self Care | Payer: 59 | Attending: Student | Admitting: Student

## 2019-03-11 DIAGNOSIS — Z7982 Long term (current) use of aspirin: Secondary | ICD-10-CM | POA: Diagnosis not present

## 2019-03-11 DIAGNOSIS — R071 Chest pain on breathing: Secondary | ICD-10-CM | POA: Insufficient documentation

## 2019-03-11 DIAGNOSIS — R0789 Other chest pain: Secondary | ICD-10-CM | POA: Diagnosis present

## 2019-03-11 DIAGNOSIS — R0602 Shortness of breath: Secondary | ICD-10-CM | POA: Insufficient documentation

## 2019-03-11 DIAGNOSIS — Z20822 Contact with and (suspected) exposure to covid-19: Secondary | ICD-10-CM | POA: Insufficient documentation

## 2019-03-11 DIAGNOSIS — Z79899 Other long term (current) drug therapy: Secondary | ICD-10-CM | POA: Diagnosis not present

## 2019-03-11 DIAGNOSIS — J45909 Unspecified asthma, uncomplicated: Secondary | ICD-10-CM | POA: Insufficient documentation

## 2019-03-11 DIAGNOSIS — I10 Essential (primary) hypertension: Secondary | ICD-10-CM | POA: Diagnosis not present

## 2019-03-11 DIAGNOSIS — E1159 Type 2 diabetes mellitus with other circulatory complications: Secondary | ICD-10-CM | POA: Insufficient documentation

## 2019-03-11 DIAGNOSIS — Z7984 Long term (current) use of oral hypoglycemic drugs: Secondary | ICD-10-CM | POA: Diagnosis not present

## 2019-03-11 LAB — BASIC METABOLIC PANEL
Anion gap: 9 (ref 5–15)
BUN: 17 mg/dL (ref 8–23)
CO2: 26 mmol/L (ref 22–32)
Calcium: 8.9 mg/dL (ref 8.9–10.3)
Chloride: 105 mmol/L (ref 98–111)
Creatinine, Ser: 0.91 mg/dL (ref 0.44–1.00)
GFR calc Af Amer: >60 mL/min (ref >60)
GFR calc non Af Amer: >60 mL/min (ref >60)
Glucose, Bld: 186 mg/dL — ABNORMAL HIGH (ref 70–99)
Potassium: 3.7 mmol/L (ref 3.5–5.1)
Sodium: 140 mmol/L (ref 135–145)

## 2019-03-11 LAB — CBC
HCT: 39.5 % (ref 36.0–46.0)
Hemoglobin: 13.1 g/dL (ref 12.0–15.0)
MCH: 30.3 pg (ref 26.0–34.0)
MCHC: 33.2 g/dL (ref 30.0–36.0)
MCV: 91.4 fL (ref 80.0–100.0)
Platelets: 224 10*3/uL (ref 150–400)
RBC: 4.32 MIL/uL (ref 3.87–5.11)
RDW: 12.4 % (ref 11.5–15.5)
WBC: 5.2 10*3/uL (ref 4.0–10.5)
nRBC: 0 % (ref 0.0–0.2)

## 2019-03-11 LAB — TROPONIN I (HIGH SENSITIVITY)
Troponin I (High Sensitivity): 7 ng/L (ref <18)
Troponin I (High Sensitivity): 8 ng/L (ref <18)

## 2019-03-11 LAB — PROTIME-INR
INR: 0.9 (ref 0.8–1.2)
Prothrombin Time: 11.9 seconds (ref 11.4–15.2)

## 2019-03-11 LAB — POC SARS CORONAVIRUS 2 AG -  ED: SARS Coronavirus 2 Ag: NEGATIVE

## 2019-03-11 LAB — POC SARS CORONAVIRUS 2 AG: SARS Coronavirus 2 Ag: NEGATIVE

## 2019-03-11 MED ORDER — IOHEXOL 350 MG/ML SOLN
100.0000 mL | Freq: Once | INTRAVENOUS | Status: AC | PRN
  Administered 2019-03-11: 100 mL via INTRAVENOUS

## 2019-03-11 NOTE — ED Triage Notes (Signed)
Patient c/o SOB and heaviness in chest for about a week. Also c/o left lower back pain and urinary frequency. Takes plavix daily

## 2019-03-11 NOTE — Discharge Instructions (Addendum)
Thank you for letting us take care of you in the emergency department today.   Please continue to take any regular, prescribed medications.   Please follow up with: - Your primary care doctor to review your ER visit and follow up on your symptoms.  - Cardiology heart doctor, referral and information for one below  Please return to the ER for any new or worsening symptoms.

## 2019-03-11 NOTE — ED Notes (Signed)
Patient at CT at this time. 

## 2019-03-11 NOTE — ED Provider Notes (Signed)
[facility] Emergency Department Provider Note ____________________________________________   First MD Initiated Contact with Patient 03/11/19 1812     (approximate)  I have reviewed the triage vital signs and the nursing notes.   HISTORY  Chief Complaint Shortness of Breath  HPI Tami Jones is a 64 y.o. female presents to the emergency department for treatment and evaluation of left-sided chest pain that radiates into her back behind her left shoulder blade that gets worse with deep breath.  She is also complaining of shortness of breath.  Symptoms started Thursday and have progressively worsened. No alleviating measures prior to arrival.         Past Medical History:  Diagnosis Date   Anxiety    Asthma    CVA (cerebral vascular accident) (HCC) 03/2006   right occipital, Dr. Thad Ranger   Diabetes mellitus    GERD (gastroesophageal reflux disease)    Hyperlipidemia    Hypertension     Patient Active Problem List   Diagnosis Date Noted   Gross hematuria 01/15/2019   Dysuria 01/15/2019   Right shoulder pain 12/04/2018   Neuropathic pain, arm 10/26/2018   MDD (major depressive disorder), single episode, moderate (HCC) 09/21/2018   Hemiparesis of right dominant side (HCC) 08/17/2018   SOB (shortness of breath) 03/08/2017   Vestibular dizziness 01/21/2016   Tick bite 07/15/2015   Cerebrovascular disease 07/17/2014   Preventative health care 06/25/2014   Right sided sciatica 03/08/2013   Chest pain 01/24/2012   MENOPAUSAL SYNDROME 09/10/2008   Mood disorder (HCC) 09/15/2007   Essential hypertension, benign 08/10/2006   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 EF 60%   CESAREAN SECTION     ESOPHAGOGASTRODUODENOSCOPY   05/2005   TONSILLECTOMY AND ADENOIDECTOMY      Prior to Admission medications   Medication Sig Start Date End Date Taking? Authorizing Provider  aspirin EC 81 MG tablet Take 81 mg by mouth daily.    [provider]  atorvastatin (LIPITOR) 80 MG tablet Take 1 tablet (80 mg total) by mouth daily. 09/21/18 09/21/19  Karie Schwalbe, MD  cephALEXin (KEFLEX) 500 MG capsule Take 1 capsule (500 mg total) by mouth 2 (two) times daily. 01/15/19   Tower, Audrie Gallus, MD  citalopram (CELEXA) 20 MG tablet Take 1 tablet (20 mg total) by mouth daily. Patient not taking: Reported on 03/08/2019 09/21/18   Tillman Abide I, MD  clopidogrel (PLAVIX) 75 MG tablet Take 1 tablet by mouth daily. 08/15/18 08/15/19  [provider]  gabapentin (NEURONTIN) 300 MG capsule Take 1 capsule (300 mg total) by mouth at bedtime. Increase to 2 capsules in ineffective. 10/26/18   Karie Schwalbe, MD  glipiZIDE (GLUCOTROL XL) 5 MG 24 hr tablet TAKE 1 TABLET BY MOUTH EVERYDAY WITH BREAKFAST 08/24/18   Tillman Abide I, MD  lansoprazole (PREVACID) 30 MG capsule Take 1 capsule (30 mg total) by mouth daily at 12 noon. 08/17/18   Karie Schwalbe, MD  lisinopril (PRINIVIL,ZESTRIL) 20 MG tablet TAKE 1 TABLET BY MOUTH DAILY 07/12/17   Tillman Abide I, MD  nystatin (MYCOSTATIN/NYSTOP) powder APPLY EXTERNALLY TO THE AFFECTED AREA TWICE DAILY 10/26/18   Karie Schwalbe, MD    Allergies Citalopram, Doxycycline hyclate, Erythromycin, Montelukast sodium, Nitrofurantoin, Sulfa antibiotics, Tramadol hcl, Venlafaxine hcl, Amoxicillin-pot clavulanate, and Clarithromycin  Family History  Problem Relation Age of Onset   Coronary artery disease Mother    Diabetes Mellitus II Mother    Heart attack Mother 88   Diabetes Mellitus II Maternal Grandmother     Social History Social History   Tobacco Use   Smoking status: Never Smoker   Smokeless tobacco: Never Used  Substance Use Topics   Alcohol use: No    Alcohol/week:  0.0 standard drinks    Comment: wine occassionally   Drug use: No    Review of Systems  Constitutional: No fever/chills Eyes: No visual changes. ENT: No sore throat. Cardiovascular: Positive for chest pain. Respiratory: Positive for shortness of breath. Gastrointestinal: No abdominal pain.  No nausea, no vomiting.  No diarrhea.  No constipation. Genitourinary: Negative for dysuria. Musculoskeletal: Positive for back pain. Skin: Negative for rash. Neurological: Negative for headaches, focal weakness or numbness  ____________________________________________   PHYSICAL EXAM:  VITAL SIGNS: ED Triage Vitals  Enc Vitals Group     BP 03/11/19 1725 (!) 170/78     Pulse Rate 03/11/19 1725 93     Resp 03/11/19 1725 (!) 22     Temp 03/11/19 1725 98.3 F (36.8 C)     Temp Source 03/11/19 1725 Oral     SpO2 03/11/19 1725 99 %     Weight 03/11/19 1726 239 lb (108.4 kg)     Height 03/11/19 1726 5\' 4"  (1.626 m)     Head Circumference --      Peak Flow --      Pain Score 03/11/19 1726 7     Pain Loc --      Pain Edu? --      Excl. in GC? --     Constitutional: Alert and oriented. Well appearing and in no acute distress. Eyes: Conjunctivae are normal. PERRL. Head: Atraumatic. Nose: No congestion/rhinnorhea. Mouth/Throat: Mucous membranes are moist.  Oropharynx non-erythematous. Neck: No stridor.   Hematological/Lymphatic/Immunilogical: No cervical lymphadenopathy. Cardiovascular: Normal rate, regular rhythm. Grossly normal heart sounds.  Good peripheral circulation. Respiratory: Normal respiratory effort.  No retractions. Lungs CTAB. Gastrointestinal: Soft and nontender. No distention. No abdominal bruits. No CVA tenderness. Genitourinary:  Musculoskeletal: No lower extremity tenderness nor edema.  No joint effusions. Neurologic:  Normal speech and language. No gross focal neurologic deficits are appreciated. Skin:  Skin is warm, dry and intact. No rash noted. Psychiatric:  Mood and affect are normal. Speech and behavior are normal.  ____________________________________________   LABS (all labs ordered are listed, but only abnormal results are displayed)  Labs Reviewed  BASIC METABOLIC PANEL - Abnormal; Notable for the following components:      Result Value   Glucose, Bld 186 (*)    All other components within normal limits  CBC  PROTIME-INR  POC SARS CORONAVIRUS 2 AG -  ED  POC SARS CORONAVIRUS 2 AG  TROPONIN I (HIGH SENSITIVITY)  TROPONIN I (HIGH SENSITIVITY)   ____________________________________________  EKG  ED ECG REPORT I, Rylea Selway, FNP-BC personally viewed and interpreted this ECG.   Date: 03/14/2019  Rate: 98  Rhythm: normal EKG, normal sinus rhythm, unchanged from previous tracings  Axis: normal  Intervals:none  ST&T Change: none  ____________________________________________  RADIOLOGY  ED MD interpretation:    Chronic bronchitic changes without acute cardiopulmonary abnormality per radiology.  Official radiology report(s): CT Head Wo Contrast  Result Date: 03/14/2019 CLINICAL DATA:  Headache EXAM: CT HEAD WITHOUT CONTRAST TECHNIQUE: Contiguous axial images were obtained from the base of the skull  through the vertex without intravenous contrast. COMPARISON:  08/03/2018 FINDINGS: Brain: Old bilateral occipital infarcts, stable. Old bilateral basal ganglia and left centrum semiovale lacunar infarcts. No acute intracranial abnormality. Specifically, no hemorrhage, hydrocephalus, mass lesion, acute infarction, or significant intracranial injury. Vascular: No hyperdense vessel or unexpected calcification. Skull: No acute calvarial abnormality. Sinuses/Orbits: Visualized paranasal sinuses and mastoids clear. Orbital soft tissues unremarkable. Other: None IMPRESSION: Old infarcts as above. No acute intracranial abnormality. Electronically Signed   By: Rolm Baptise M.D.   On: 03/14/2019 01:56     ____________________________________________   PROCEDURES  Procedure(s) performed (including Critical Care):  Procedures  ____________________________________________   INITIAL IMPRESSION / ASSESSMENT AND PLAN     64 year old female presenting to the emergency department for evaluation of left side chest pain. See HPI for further details. Plan will be to get labs, chest x-ray, and CTA to rule out PE due to pleuritic type pain  DIFFERENTIAL DIAGNOSIS  Pulmonary embolus, dissection, MI, atypical chest pain, angina.  ED COURSE  Initial troponin is normal, chest x-ray unremarkable, PT inr reassuring, glucose is mildly elevated at 186. CTA negative for acute findings per radiology.  Plan will now be to await repeat troponin. Care relinquished to Dr. Joan Mayans who will determine final diagnosis and disposition.  ____________________________________________   FINAL CLINICAL IMPRESSION(S) / ED DIAGNOSES  Final diagnoses:  Chest discomfort  SOB (shortness of breath)     ED Discharge Orders    None       Note:  This document was prepared using Dragon voice recognition software and may include unintentional dictation errors.   Victorino Dike, FNP 03/14/19 1723    Lilia Pro., MD 03/14/19 Drema Halon

## 2019-03-13 ENCOUNTER — Ambulatory Visit: Payer: Managed Care, Other (non HMO) | Admitting: Physical Therapy

## 2019-03-13 ENCOUNTER — Encounter: Payer: Managed Care, Other (non HMO) | Admitting: Occupational Therapy

## 2019-03-14 ENCOUNTER — Emergency Department: Payer: 59

## 2019-03-14 ENCOUNTER — Telehealth: Payer: Self-pay

## 2019-03-14 ENCOUNTER — Emergency Department
Admission: EM | Admit: 2019-03-14 | Discharge: 2019-03-14 | Disposition: A | Discharge status: Home / Self Care | Payer: 59 | Attending: Emergency Medicine | Admitting: Emergency Medicine

## 2019-03-14 DIAGNOSIS — Z7901 Long term (current) use of anticoagulants: Secondary | ICD-10-CM | POA: Diagnosis not present

## 2019-03-14 DIAGNOSIS — R519 Headache, unspecified: Secondary | ICD-10-CM | POA: Insufficient documentation

## 2019-03-14 DIAGNOSIS — Z79899 Other long term (current) drug therapy: Secondary | ICD-10-CM | POA: Insufficient documentation

## 2019-03-14 DIAGNOSIS — I10 Essential (primary) hypertension: Secondary | ICD-10-CM | POA: Diagnosis not present

## 2019-03-14 DIAGNOSIS — Z7982 Long term (current) use of aspirin: Secondary | ICD-10-CM | POA: Diagnosis not present

## 2019-03-14 DIAGNOSIS — E119 Type 2 diabetes mellitus without complications: Secondary | ICD-10-CM | POA: Insufficient documentation

## 2019-03-14 DIAGNOSIS — I69351 Hemiplegia and hemiparesis following cerebral infarction affecting right dominant side: Secondary | ICD-10-CM | POA: Diagnosis not present

## 2019-03-14 LAB — CBC
HCT: 40.5 % (ref 36.0–46.0)
Hemoglobin: 13.8 g/dL (ref 12.0–15.0)
MCH: 30.5 pg (ref 26.0–34.0)
MCHC: 34.1 g/dL (ref 30.0–36.0)
MCV: 89.6 fL (ref 80.0–100.0)
Platelets: 218 10*3/uL (ref 150–400)
RBC: 4.52 MIL/uL (ref 3.87–5.11)
RDW: 12.1 % (ref 11.5–15.5)
WBC: 7.1 10*3/uL (ref 4.0–10.5)
nRBC: 0 % (ref 0.0–0.2)

## 2019-03-14 LAB — BASIC METABOLIC PANEL
Anion gap: 8 (ref 5–15)
BUN: 22 mg/dL (ref 8–23)
CO2: 24 mmol/L (ref 22–32)
Calcium: 9.1 mg/dL (ref 8.9–10.3)
Chloride: 105 mmol/L (ref 98–111)
Creatinine, Ser: 0.98 mg/dL (ref 0.44–1.00)
GFR calc Af Amer: >60 mL/min (ref >60)
GFR calc non Af Amer: >60 mL/min (ref >60)
Glucose, Bld: 224 mg/dL — ABNORMAL HIGH (ref 70–99)
Potassium: 4 mmol/L (ref 3.5–5.1)
Sodium: 137 mmol/L (ref 135–145)

## 2019-03-14 LAB — TROPONIN I (HIGH SENSITIVITY): Troponin I (High Sensitivity): 10 ng/L (ref <18)

## 2019-03-14 MED ORDER — SODIUM CHLORIDE 0.9 % IV BOLUS
1000.0000 mL | Freq: Once | INTRAVENOUS | Status: AC
  Administered 2019-03-14: 1000 mL via INTRAVENOUS

## 2019-03-14 MED ORDER — AMLODIPINE BESYLATE 5 MG PO TABS
5.0000 mg | ORAL_TABLET | Freq: Once | ORAL | Status: AC
  Administered 2019-03-14: 5 mg via ORAL
  Filled 2019-03-14: qty 1

## 2019-03-14 MED ORDER — ACETAMINOPHEN 500 MG PO TABS
ORAL_TABLET | ORAL | Status: AC
  Filled 2019-03-14: qty 2

## 2019-03-14 MED ORDER — METOCLOPRAMIDE HCL 5 MG/ML IJ SOLN
10.0000 mg | Freq: Once | INTRAMUSCULAR | Status: AC
  Administered 2019-03-14: 10 mg via INTRAVENOUS
  Filled 2019-03-14: qty 2

## 2019-03-14 MED ORDER — ACETAMINOPHEN 500 MG PO TABS
1000.0000 mg | ORAL_TABLET | Freq: Once | ORAL | Status: AC
  Administered 2019-03-14: 03:00:00 1000 mg via ORAL

## 2019-03-14 NOTE — Discharge Instructions (Addendum)
You have been seen in the Emergency Department (ED) for a headache. Your evaluation today was overall reassuring. Headaches have many possible causes. Most headaches aren't a sign of a more serious problem, and they will get better on their own.   Follow-up with your doctor in 12-24 hours if you are still having a headache. Otherwise follow up with your doctor in 3-5 days.  For pain take tylenol 1000mg every 8 hours as needed  When should you call for help?  Call 911 or return to the ED anytime you think you may need emergency care. For example, call if:  You have signs of a stroke. These may include:  Sudden numbness, paralysis, or weakness in your face, arm, or leg, especially on only one side of your body.  Sudden vision changes.  Sudden trouble speaking.  Sudden confusion or trouble understanding simple statements.  Sudden problems with walking or balance.  A sudden, severe headache that is different from past headaches. You have new or worsening headache Nausea and vomiting associated with your headache Fever, neck stiffness associated with your headache  Call your doctor now or seek immediate medical care if:  You have a new or worse headache.  Your headache gets much worse.  How can you care for yourself at home?  Do not drive if you have taken a prescription pain medicine.  Rest in a quiet, dark room until your headache is gone. Close your eyes and try to relax or go to sleep. Don't watch TV or read.  Put a cold, moist cloth or cold pack on the painful area for 10 to 20 minutes at a time. Put a thin cloth between the cold pack and your skin.  Use a warm, moist towel or a heating pad set on low to relax tight shoulder and neck muscles.  Have someone gently massage your neck and shoulders.  Take pain medicines exactly as directed.  If the doctor gave you a prescription medicine for pain, take it as prescribed.  If you are not taking a prescription pain medicine, ask your doctor  if you can take an over-the-counter medicine. Be careful not to take pain medicine more often than the instructions allow, because you may get worse or more frequent headaches when the medicine wears off.  Do not ignore new symptoms that occur with a headache, such as a fever, weakness or numbness, vision changes, or confusion. These may be signs of a more serious problem.  To prevent headaches  Keep a headache diary so you can figure out what triggers your headaches. Avoiding triggers may help you prevent headaches. Record when each headache began, how long it lasted, and what the pain was like (throbbing, aching, stabbing, or dull). Write down any other symptoms you had with the headache, such as nausea, flashing lights or dark spots, or sensitivity to bright light or loud noise. Note if the headache occurred near your period. List anything that might have triggered the headache, such as certain foods (chocolate, cheese, wine) or odors, smoke, bright light, stress, or lack of sleep.  Find healthy ways to deal with stress. Headaches are most common during or right after stressful times. Take time to relax before and after you do something that has caused a headache in the past.  Try to keep your muscles relaxed by keeping good posture. Check your jaw, face, neck, and shoulder muscles for tension, and try relaxing them. When sitting at a desk, change positions often, and stretch for   30 seconds each hour.  Get plenty of sleep and exercise.  Eat regularly and well. Long periods without food can trigger a headache.  Treat yourself to a massage. Some people find that regular massages are very helpful in relieving tension.  Limit caffeine by not drinking too much coffee, tea, or soda. But don't quit caffeine suddenly, because that can also give you headaches.  Reduce eyestrain from computers by blinking frequently and looking away from the computer screen every so often. Make sure you have proper eyewear and  that your monitor is set up properly, about an arm's length away.  Seek help if you have depression or anxiety. Your headaches may be linked to these conditions. Treatment can both prevent headaches and help with symptoms of anxiety or depression.

## 2019-03-14 NOTE — ED Notes (Signed)
Report off to kim rn  

## 2019-03-14 NOTE — Telephone Encounter (Signed)
Tami Schwalbe, MD  Eual Fines, CMA  Please check on her   Pt went to ER early this morning. Dr Alphonsus Sias asked that we call and check on her. It went straight to VM and the VM is full so I could not leave a message

## 2019-03-14 NOTE — ED Notes (Signed)
Pt brought in via ems from home with a headache, elevated blood pressure.  Pt denies chest pain or sob.  No fever or cough.  Pt seen in ER this week for chest pain.  Pt reports taking her blood pressure meds.  Pt alert  Speech clear.  md at bedside now.

## 2019-03-14 NOTE — ED Notes (Signed)
Iv fluids infusing   meds given.  nsr on monitor.  Pt alert  Speech clear.

## 2019-03-14 NOTE — ED Notes (Signed)
Notified daughter Ashok Cordia per patient request of patient discharging and needing ride home. Daughter states would be here in 15-20 mins.

## 2019-03-14 NOTE — ED Triage Notes (Signed)
Pt arrived via EMS from home where she woke this AM with sudden onset of HA, dizziness and was disoriented. Pt had previous stroke in June 2020 that affected her right side. Pt able to answer question but slow to respond. Pt also c/o dry mouth.

## 2019-03-14 NOTE — ED Provider Notes (Signed)
[facility] Emergency Department Provider Note  ____________________________________________  Time seen: Approximately 2:41 AM  I have reviewed the triage vital signs and the nursing notes.   HISTORY  Chief Complaint Hypertension and Headache   HPI Tami Jones is a 64 y.o. female history of CVA with R sided weakness on Plavix and ASA, DM, HTN, HLD, vestibular dizziness who presents for evaluation of HA. Patient reports taking all her meds yesterday as prescribed and went to bed. She woke up at 1AM with a diffuse throbbing 8/10 HA and lightheadedness. She reports that she felt that her thinking was foggy. She got up and walked with her walker to get her daughter who called 911. She denies any changes in her chronic deficits from her stroke. She reports having similar headaches since having her stroke usually in the setting of her blood pressure being elevated.  She endorses compliance with all her medications.  Patient reports that she has not been feeling well for this past week.  In fact, she was in our ED 2 days ago for SOB and feeling unwell. She tested negative for Covid. She denies cough, fever, CP, sore throat, SOB, V/D, loss of taste or smell, body aches.  Patient reports feeling very dry.  Has not been drinking normally this past week.  Past Medical History:  Diagnosis Date  . Anxiety   . Asthma   . CVA (cerebral vascular accident) ([place]) 03/2006   right occipital, Dr. Doy Mince  . Diabetes mellitus   . GERD (gastroesophageal reflux disease)   . Hyperlipidemia   . Hypertension     Patient Active Problem List   Diagnosis Date Noted  . Gross hematuria 01/15/2019  . Dysuria 01/15/2019  . Right shoulder pain 12/04/2018  . Neuropathic pain, arm 10/26/2018  . MDD (major depressive disorder), single episode, moderate ([place]) 09/21/2018  . Hemiparesis of right dominant side ([place]) 08/17/2018  . SOB (shortness of breath) 03/08/2017  . Vestibular dizziness  01/21/2016  . Tick bite 07/15/2015  . Cerebrovascular disease 07/17/2014  . Preventative health care 06/25/2014  . Right sided sciatica 03/08/2013  . Chest pain 01/24/2012  . MENOPAUSAL SYNDROME 09/10/2008  . Mood disorder ([place]) 09/15/2007  . Essential hypertension, benign 08/10/2006  . Type 2 diabetes mellitus with other circulatory complications ([place]) 32/95/1884  . HYPERCHOLESTEROLEMIA 06/17/2006  . GERD 06/17/2006    Past Surgical History:  Procedure Laterality Date  . CARDIOVASCULAR STRESS TEST  1/15   myoview. EF 60%  . CESAREAN SECTION    . ESOPHAGOGASTRODUODENOSCOPY  05/2005  . TONSILLECTOMY AND ADENOIDECTOMY      Prior to Admission medications   Medication Sig Start Date End Date Taking? Authorizing Provider  aspirin EC 81 MG tablet Take 81 mg by mouth daily.    [provider]  atorvastatin (LIPITOR) 80 MG tablet Take 1 tablet (80 mg total) by mouth daily. 09/21/18 09/21/19  Venia Carbon, MD  cephALEXin (KEFLEX) 500 MG capsule Take 1 capsule (500 mg total) by mouth 2 (two) times daily. 01/15/19   Tower, Wynelle Fanny, MD  citalopram (CELEXA) 20 MG tablet Take 1 tablet (20 mg total) by mouth daily. Patient not taking: Reported on 03/08/2019 09/21/18   Viviana Simpler I, MD  clopidogrel (PLAVIX) 75 MG tablet Take 1 tablet by mouth daily. 08/15/18 08/15/19  [provider]  gabapentin (NEURONTIN) 300 MG capsule Take 1 capsule (300 mg total) by mouth at bedtime. Increase to 2 capsules in ineffective. 10/26/18   Silvio Pate,  Richard I, MD  glipiZIDE (GLUCOTROL XL) 5 MG 24 hr tablet TAKE 1 TABLET BY MOUTH EVERYDAY WITH BREAKFAST 08/24/18   Tillman Abide I, MD  lansoprazole (PREVACID) 30 MG capsule Take 1 capsule (30 mg total) by mouth daily at 12 noon. 08/17/18   Karie Schwalbe, MD  lisinopril (PRINIVIL,ZESTRIL) 20 MG tablet TAKE 1 TABLET BY MOUTH DAILY 07/12/17   Tillman Abide I, MD  nystatin (MYCOSTATIN/NYSTOP) powder APPLY EXTERNALLY TO THE AFFECTED AREA TWICE DAILY  10/26/18   Karie Schwalbe, MD    Allergies Citalopram, Doxycycline hyclate, Erythromycin, Montelukast sodium, Nitrofurantoin, Sulfa antibiotics, Tramadol hcl, Venlafaxine hcl, Amoxicillin-pot clavulanate, and Clarithromycin  Family History  Problem Relation Age of Onset  . Coronary artery disease Mother   . Diabetes Mellitus II Mother   . Heart attack Mother 67  . Diabetes Mellitus II Maternal Grandmother     Social History Social History   Tobacco Use  . Smoking status: Never Smoker  . Smokeless tobacco: Never Used  Substance Use Topics  . Alcohol use: No    Alcohol/week: 0.0 standard drinks    Comment: wine occassionally  . Drug use: No    Review of Systems  Constitutional: Negative for fever. + Lightheadedness Eyes: Negative for visual changes. ENT: Negative for sore throat. Neck: No neck pain  Cardiovascular: Negative for chest pain. Respiratory: Negative for shortness of breath. Gastrointestinal: Negative for abdominal pain, vomiting or diarrhea. Genitourinary: Negative for dysuria. Musculoskeletal: Negative for back pain. Skin: Negative for rash. Neurological: Negative for weakness or numbness. + HA Psych: No SI or HI  ____________________________________________   PHYSICAL EXAM:  VITAL SIGNS: ED Triage Vitals [03/14/19 0133]  Enc Vitals Group     BP (!) 203/97     Pulse Rate (!) 127     Resp (!) 22     Temp 99.3 F (37.4 C)     Temp Source Oral     SpO2 97 %     Weight      Height      Head Circumference      Peak Flow      Pain Score      Pain Loc      Pain Edu?      Excl. in GC?     Constitutional: Alert and oriented. Well appearing and in no apparent distress. HEENT:      Head: Normocephalic and atraumatic.         Eyes: Conjunctivae are normal. Sclera is non-icteric.       Mouth/Throat: Mucous membranes are moist.       Neck: Supple with no signs of meningismus. Cardiovascular: Regular rate and rhythm. No murmurs, gallops, or rubs.  2+ symmetrical distal pulses are present in all extremities. No JVD. Respiratory: Normal respiratory effort. Lungs are clear to auscultation bilaterally. No wheezes, crackles, or rhonchi.  Gastrointestinal: Soft, non tender, and non distended with positive bowel sounds. No rebound or guarding. Musculoskeletal: Nontender with normal range of motion in all extremities. No edema, cyanosis, or erythema of extremities. Neurologic: Normal speech and language. Face is symmetric. EOMI, PERRL, RUE 5-/5, RLE 4/5, 5/5 strength on the left, no pronator drift or dysmetria Skin: Skin is warm, dry and intact. No rash noted. Psychiatric: Mood and affect are normal. Speech and behavior are normal.  ____________________________________________   LABS (all labs ordered are listed, but only abnormal results are displayed)  Labs Reviewed  BASIC METABOLIC PANEL - Abnormal; Notable for the following components:  Result Value   Glucose, Bld 224 (*)    All other components within normal limits  CBC  URINALYSIS, COMPLETE (UACMP) WITH MICROSCOPIC  TROPONIN I (HIGH SENSITIVITY)  TROPONIN I (HIGH SENSITIVITY)   ____________________________________________  EKG  ED ECG REPORT I, Nita Sickle, the attending physician, personally viewed and interpreted this ECG.  Sinus tachycardia, rate of 112, normal intervals, normal axis, LVH.  Unchanged from prior. ____________________________________________  RADIOLOGY  I have personally reviewed the images performed during this visit and I agree with the Radiologist's read.   Interpretation by Radiologist:  CT Head Wo Contrast  Result Date: 03/14/2019 CLINICAL DATA:  Headache EXAM: CT HEAD WITHOUT CONTRAST TECHNIQUE: Contiguous axial images were obtained from the base of the skull through the vertex without intravenous contrast. COMPARISON:  08/03/2018 FINDINGS: Brain: Old bilateral occipital infarcts, stable. Old bilateral basal ganglia and left centrum  semiovale lacunar infarcts. No acute intracranial abnormality. Specifically, no hemorrhage, hydrocephalus, mass lesion, acute infarction, or significant intracranial injury. Vascular: No hyperdense vessel or unexpected calcification. Skull: No acute calvarial abnormality. Sinuses/Orbits: Visualized paranasal sinuses and mastoids clear. Orbital soft tissues unremarkable. Other: None IMPRESSION: Old infarcts as above. No acute intracranial abnormality. Electronically Signed   By: Charlett Nose M.D.   On: 03/14/2019 01:56     ____________________________________________   PROCEDURES  Procedure(s) performed: None Procedures Critical Care performed:  None ____________________________________________   INITIAL IMPRESSION / ASSESSMENT AND PLAN / ED COURSE  64 y.o. female history of CVA with R sided weakness on Plavix and ASA, DM, HTN, HLD, vestibular dizziness who presents for evaluation of HA, lightheadedness, and elevated BP.  Patient has right-sided weakness which is chronic since her stroke according to her.  She has no meningeal signs, no fever.  Head CT showing no evidence of bleed.  Does show old strokes.  Blood pressure initially elevated with systolics in the 200s but as the pain is improving the blood pressure is trending down.  We will give her morning dose of amlodipine, gram of Tylenol and Reglan for her headache.  Ddx post-stroke HA, migraine, tension HA, SAH, intracranial bleed.     _________________________ 4:29 AM on 03/14/2019 -----------------------------------------  HA resolved after tylenol and reglan. Patient remains well appearing. Labs with no acute findings. Will dc home on supportive care and f/u with PCP. Discussed my standard return precautions.     As part of my medical decision making, I reviewed the following data within the electronic MEDICAL RECORD NUMBER Nursing notes reviewed and incorporated, Labs reviewed , EKG interpreted , Old EKG reviewed, Old chart reviewed,  Radiograph reviewed , Notes from prior ED visits and [place] Controlled Substance Database   Please note:  Patient was evaluated in Emergency Department today for the symptoms described in the history of present illness. Patient was evaluated in the context of the global COVID-19 pandemic, which necessitated consideration that the patient might be at risk for infection with the SARS-CoV-2 virus that causes COVID-19. Institutional protocols and algorithms that pertain to the evaluation of patients at risk for COVID-19 are in a state of rapid change based on information released by regulatory bodies including the CDC and federal and state organizations. These policies and algorithms were followed during the patient's care in the ED.  Some ED evaluations and interventions may be delayed as a result of limited staffing during the pandemic.   ____________________________________________   FINAL CLINICAL IMPRESSION(S) / ED DIAGNOSES   Final diagnoses:  Acute nonintractable headache, unspecified headache type  Essential  hypertension      NEW MEDICATIONS STARTED DURING THIS VISIT:  ED Discharge Orders    None       Note:  This document was prepared using Dragon voice recognition software and may include unintentional dictation errors.    Don Perking, [place], MD 03/14/19 0430

## 2019-03-15 ENCOUNTER — Ambulatory Visit: Payer: Managed Care, Other (non HMO) | Admitting: Physical Therapy

## 2019-03-23 ENCOUNTER — Telehealth: Payer: Self-pay

## 2019-03-23 NOTE — Telephone Encounter (Signed)
Patient called wanting to let Dr Alphonsus Sias know that she has been having issues with her B/P spiking off and on. She does not know why it is spiking. She has been to the ER x 2 in the last few weeks. She saw Dr Juliann Pares on 03/21/19 and was placed on holter monitor that she will take off today and is getting Echocardiogram scheduled. Dr Juliann Pares told patient to continue taking Lisinopril 20 mg 1 daily and Amlodipine 5 mg 1 tablet twice daily and will follow up after Echo and Holter monitor results come in. Patient states last night her b/p was up again but not sure what the number was. Most of the time the b/p spikes up at night and it wakes her up. She feels she is having anxiety attack(gets chest pain/tightness sensation) when her b/p gets over 200/100ish. This morning she has not taking her medication yet and her B/P is 182/104 (uses wrist b/p cuff). This morning she feels ok so far no anxiety feeling no chest discomfort or tightness of sob. Patient just does not understand why all of a sudden her b/p starting to spike off and on. B/p has been controlled well on her medications until the last few weeks.

## 2019-03-23 NOTE — Telephone Encounter (Signed)
Tried to call pt. No answer and Mailbox is full so I could not leave a message.

## 2019-03-23 NOTE — Telephone Encounter (Signed)
Please let her know that the wrist cuffs are often unreliable. She should take it to her next doctor appointment and compare it to their numbers It is possible that she is awakening in panic, and that is elevating her blood pressure (not the other way around). Continue the medications per Dr Juliann Pares and try to settle down some (the anxiety can increase her BP)

## 2019-03-29 NOTE — Telephone Encounter (Signed)
Pt made appt tomorrow.

## 2019-03-30 ENCOUNTER — Ambulatory Visit: Payer: 59 | Admitting: Internal Medicine

## 2019-04-02 ENCOUNTER — Encounter: Payer: Self-pay | Admitting: Internal Medicine

## 2019-04-02 ENCOUNTER — Other Ambulatory Visit: Payer: Self-pay

## 2019-04-02 ENCOUNTER — Ambulatory Visit (INDEPENDENT_AMBULATORY_CARE_PROVIDER_SITE_OTHER): Payer: 59 | Admitting: Internal Medicine

## 2019-04-02 DIAGNOSIS — F39 Unspecified mood [affective] disorder: Secondary | ICD-10-CM

## 2019-04-02 DIAGNOSIS — I10 Essential (primary) hypertension: Secondary | ICD-10-CM | POA: Diagnosis not present

## 2019-04-02 DIAGNOSIS — E1159 Type 2 diabetes mellitus with other circulatory complications: Secondary | ICD-10-CM

## 2019-04-02 MED ORDER — DULOXETINE HCL 30 MG PO CPEP: 30.0000 mg | ORAL_CAPSULE | Freq: Every day | ORAL | 3 refills | Status: DC

## 2019-04-02 MED ORDER — LISINOPRIL-HYDROCHLOROTHIAZIDE 20-25 MG PO TABS: 1.0000 | ORAL_TABLET | ORAL | 3 refills | Status: DC

## 2019-04-02 NOTE — Assessment & Plan Note (Signed)
BP Readings from Last 3 Encounters:  04/02/19 138/88  03/14/19 (!) 145/53  03/11/19 (!) 163/102   Hard to tell if anxiety brings on the higher BP or vice versa With recent CVA---will add HCTZ to the lisinopril and amlodipine

## 2019-04-02 NOTE — Assessment & Plan Note (Signed)
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

## 2019-04-02 NOTE — Assessment & Plan Note (Signed)
Lab Results  Component Value Date   HGBA1C 7.7 (A) 12/04/2018   Will recheck at next visit Try the duloxetine instead of gabapentin (since she didn't like how she felt on it)

## 2019-04-02 NOTE — Progress Notes (Signed)
Subjective:    Patient ID: Tami Jones, female    DOB: 1955/04/28, 64 y.o.   MRN: [id number]  HPI Here due to left upper back pain and elevated BP This visit occurred during the SARS-CoV-2 public health emergency.  Safety protocols were in place, including screening questions prior to the visit, additional usage of staff PPE, and extensive cleaning of exam room while observing appropriate contact time as indicated for disinfecting solutions.   Has noticed BP spiking at night Will actually awaken her from sleep---felt bad and had panic BP 200/100 and went to ER Felt SOB at that time  Some left upper flank pain--radiates around to front and breast CT angio was negative 1/24 Has noted some swelling in her feet lately Some AM dizziness--upon first arising. Nothing striking No other chest pain  Stopped the citalopram a while ago--"I didn't feel good on it"  Current Outpatient Medications on File Prior to Visit  Medication Sig Dispense Refill  . amLODipine (NORVASC) 5 MG tablet Take 1 tablet by mouth 2 (two) times daily.    Marland Kitchen aspirin EC 81 MG tablet Take 81 mg by mouth daily.    Marland Kitchen atorvastatin (LIPITOR) 80 MG tablet Take 1 tablet (80 mg total) by mouth daily. 90 tablet 3  . citalopram (CELEXA) 20 MG tablet Take 1 tablet (20 mg total) by mouth daily. 90 tablet 3  . clopidogrel (PLAVIX) 75 MG tablet Take 1 tablet by mouth daily.    Marland Kitchen glipiZIDE (GLUCOTROL XL) 5 MG 24 hr tablet TAKE 1 TABLET BY MOUTH EVERYDAY WITH BREAKFAST 90 tablet 3  . lansoprazole (PREVACID) 30 MG capsule Take 1 capsule (30 mg total) by mouth daily at 12 noon. 30 capsule 11  . lisinopril (PRINIVIL,ZESTRIL) 20 MG tablet TAKE 1 TABLET BY MOUTH DAILY 30 tablet 10  . nystatin (MYCOSTATIN/NYSTOP) powder APPLY EXTERNALLY TO THE AFFECTED AREA TWICE DAILY 60 g 3  . gabapentin (NEURONTIN) 300 MG capsule Take 1 capsule (300 mg total) by mouth at bedtime. Increase to 2 capsules in ineffective. (Patient not taking: Reported on  04/02/2019) 60 capsule 3   No current facility-administered medications on file prior to visit.    Allergies  Allergen Reactions  . Citalopram Other (See Comments)    Feels odd  . Doxycycline Hyclate Nausea And Vomiting  . Erythromycin Other (See Comments)    Irritated stomach  . Montelukast Sodium Itching  . Nitrofurantoin Nausea And Vomiting  . Sulfa Antibiotics Nausea And Vomiting  . Tramadol Hcl Other (See Comments)    Feels odd  . Venlafaxine Hcl Other (See Comments)    Feels odd  . Amoxicillin-Pot Clavulanate Nausea And Vomiting  . Clarithromycin Rash     See ER note 02/2017    Past Medical History:  Diagnosis Date  . Anxiety   . Asthma   . 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 Thad Ranger  . Diabetes mellitus   . GERD (gastroesophageal reflux disease)   . Hyperlipidemia   . Hypertension     Past Surgical History:  Procedure Laterality Date  . CARDIOVASCULAR STRESS TEST  1/15   myoview. EF 60%  . CESAREAN SECTION    . ESOPHAGOGASTRODUODENOSCOPY  05/2005  . TONSILLECTOMY AND ADENOIDECTOMY      Family History  Problem Relation Age of Onset  . Coronary artery disease Mother   . Diabetes Mellitus II Mother   . Heart attack Mother 84  . Diabetes Mellitus II Maternal Grandmother  Social History   Socioeconomic History  . Marital status: Divorced    Spouse name: Not on file  . Number of children: 2  . Years of education: Not on file  . Highest education level: Not on file  Occupational History  . Occupation: LAB TECH    Employer: [employer]  Tobacco Use  . Smoking status: Never Smoker  . Smokeless tobacco: Never Used  Substance and Sexual Activity  . Alcohol use: No    Alcohol/week: 0.0 standard drinks    Comment: wine occassionally  . Drug use: No  . Sexual activity: Yes    Birth control/protection: Condom  Other Topics Concern  . Not on file  Social History Narrative  . Not on file   Social Determinants of  Health   Financial Resource Strain:   . Difficulty of Paying Living Expenses: Not on file  Food Insecurity:   . Worried About Charity fundraiser in the Last Year: Not on file  . Ran Out of Food in the Last Year: Not on file  Transportation Needs:   . Lack of Transportation (Medical): Not on file  . Lack of Transportation (Non-Medical): Not on file  Physical Activity:   . Days of Exercise per Week: Not on file  . Minutes of Exercise per Session: Not on file  Stress:   . Feeling of Stress : Not on file  Social Connections:   . Frequency of Communication with Friends and Family: Not on file  . Frequency of Social Gatherings with Friends and Family: Not on file  . Attends Religious Services: Not on file  . Active Member of Clubs or Organizations: Not on file  . Attends Archivist Meetings: Not on file  . Marital Status: Not on file  Intimate Partner Violence:   . Fear of Current or Ex-Partner: Not on file  . Emotionally Abused: Not on file  . Physically Abused: Not on file  . Sexually Abused: Not on file   Review of Systems Not sleeping well On SSI disability and long term disability from work Appetite is off--has lost a few pounds    Objective:   Physical Exam  Constitutional: She appears well-developed. No distress.  Neck: No thyromegaly present.  Cardiovascular: Normal rate, regular rhythm and normal heart sounds. Exam reveals no gallop.  No murmur heard. Respiratory: Effort normal and breath sounds normal. No respiratory distress. She has no wheezes. She has no rales.  Musculoskeletal:        General: No edema.  Lymphadenopathy:    She has no cervical adenopathy.  Psychiatric:  Mild anxiety No overt depression but looks a little sad            Assessment & Plan:

## 2019-04-30 ENCOUNTER — Ambulatory Visit: Payer: 59 | Admitting: Internal Medicine

## 2019-05-13 ENCOUNTER — Ambulatory Visit: Payer: 59 | Attending: Internal Medicine

## 2019-05-13 DIAGNOSIS — Z23 Encounter for immunization: Secondary | ICD-10-CM

## 2019-05-13 NOTE — Progress Notes (Signed)
   Covid-19 Vaccination Clinic  Name:  Tami Jones    MRN: [id number] DOB: 08/03/1955  05/13/2019  Ms. Tami Jones was observed post Covid-19 immunization for 30 minutes due to complaints feeling hot and jittery She was provided with Vaccine Information Sheet and instruction to access the V-Safe system.   Ms. Tami Jones was instructed to call 911 with any severe reactions post vaccine: Marland Kitchen Difficulty breathing  . Swelling of face and throat  . A fast heartbeat  . A bad rash all over body  . Dizziness and weakness   Immunizations Administered    Name Date Dose VIS Date Route   Pfizer COVID-19 Vaccine 05/13/2019  1:27 PM 0.3 mL 01/26/2019 Intramuscular   Manufacturer: [employer], [employer]   Lot: [id number]   NDC: [address]-9

## 2019-05-13 NOTE — Progress Notes (Signed)
   Covid-19 Vaccination Clinic  Name:  Tami Jones    MRN: [id number] DOB: 09-14-1955  05/13/2019  Ms. Shipton was observed post Covid-19 immunization for 30 minutes based on pre-vaccination screening .  During the observation period, she experienced an adverse reaction with the following symptoms:  Hot, jittery.  Assessment : Time of assessment 1332 Alert and oriented. And anxious  Actions taken: VS taken at 1332, BP- 199/147, P- 123, O2- 100%,placed on stretcher, offered water and drinking, patient reports that she took her BP meds late today, VS recheck at 1344- BP- 182/73, P- 90, O2- 100%, symptoms residing, daughter in room. 1350 BP- 175/72. P- 89, O2- 100%, at 1400, BP- 174/89, P- 64, discharge with daughter at 40.     Medications administered: No medication administered.  Disposition: Reports no further symptoms of adverse reaction after observation for 30 minutes. Discharged home.   Immunizations Administered    Name Date Dose VIS Date Route   Pfizer COVID-19 Vaccine 05/13/2019  1:27 PM 0.3 mL 01/26/2019 Intramuscular   Manufacturer: [employer], [employer]   Lot: [id number]   NDC: [address]-5

## 2019-05-15 ENCOUNTER — Encounter: Payer: Self-pay | Admitting: *Deleted

## 2019-05-15 ENCOUNTER — Telehealth: Payer: Self-pay | Admitting: *Deleted

## 2019-05-15 NOTE — Telephone Encounter (Signed)
Attempted to call Donn and speak to her in regards to her COVID vaccine that she received on 05/13/2019. There was no answer and the call went straight to voicemail, unfortunately the mailbox was full and was unable to leave message. Patient has sent a MyChart message to her PCP. Will attempt to call again.

## 2019-06-04 ENCOUNTER — Ambulatory Visit: Payer: Self-pay

## 2019-06-04 NOTE — Telephone Encounter (Signed)
Patient calling to cancel second injection appointment.   , due to a cold.  State she will call back to reschedule appointment.

## 2019-06-06 ENCOUNTER — Ambulatory Visit: Payer: Self-pay

## 2019-06-09 ENCOUNTER — Observation Stay
Admission: EM | Admit: 2019-06-09 | Discharge: 2019-06-10 | Disposition: A | Discharge status: Home / Self Care | Payer: 59 | Attending: Internal Medicine | Admitting: Internal Medicine

## 2019-06-09 ENCOUNTER — Observation Stay: Payer: 59

## 2019-06-09 ENCOUNTER — Emergency Department: Payer: 59

## 2019-06-09 ENCOUNTER — Other Ambulatory Visit: Payer: Self-pay

## 2019-06-09 ENCOUNTER — Encounter: Payer: Self-pay | Admitting: Emergency Medicine

## 2019-06-09 DIAGNOSIS — I10 Essential (primary) hypertension: Secondary | ICD-10-CM | POA: Diagnosis present

## 2019-06-09 DIAGNOSIS — R471 Dysarthria and anarthria: Secondary | ICD-10-CM | POA: Insufficient documentation

## 2019-06-09 DIAGNOSIS — E1159 Type 2 diabetes mellitus with other circulatory complications: Secondary | ICD-10-CM | POA: Insufficient documentation

## 2019-06-09 DIAGNOSIS — G459 Transient cerebral ischemic attack, unspecified: Secondary | ICD-10-CM

## 2019-06-09 DIAGNOSIS — E785 Hyperlipidemia, unspecified: Secondary | ICD-10-CM | POA: Diagnosis not present

## 2019-06-09 DIAGNOSIS — N1831 Chronic kidney disease, stage 3a: Secondary | ICD-10-CM

## 2019-06-09 DIAGNOSIS — I69351 Hemiplegia and hemiparesis following cerebral infarction affecting right dominant side: Secondary | ICD-10-CM | POA: Insufficient documentation

## 2019-06-09 DIAGNOSIS — K219 Gastro-esophageal reflux disease without esophagitis: Secondary | ICD-10-CM | POA: Diagnosis present

## 2019-06-09 DIAGNOSIS — F329 Major depressive disorder, single episode, unspecified: Secondary | ICD-10-CM | POA: Diagnosis not present

## 2019-06-09 DIAGNOSIS — I129 Hypertensive chronic kidney disease with stage 1 through stage 4 chronic kidney disease, or unspecified chronic kidney disease: Secondary | ICD-10-CM | POA: Diagnosis not present

## 2019-06-09 DIAGNOSIS — Z6841 Body Mass Index (BMI) 40.0 and over, adult: Secondary | ICD-10-CM | POA: Diagnosis not present

## 2019-06-09 DIAGNOSIS — R4781 Slurred speech: Secondary | ICD-10-CM | POA: Diagnosis not present

## 2019-06-09 DIAGNOSIS — Z7984 Long term (current) use of oral hypoglycemic drugs: Secondary | ICD-10-CM | POA: Diagnosis not present

## 2019-06-09 DIAGNOSIS — J45909 Unspecified asthma, uncomplicated: Secondary | ICD-10-CM | POA: Insufficient documentation

## 2019-06-09 DIAGNOSIS — I6340 Cerebral infarction due to embolism of unspecified cerebral artery: Principal | ICD-10-CM | POA: Insufficient documentation

## 2019-06-09 DIAGNOSIS — E1122 Type 2 diabetes mellitus with diabetic chronic kidney disease: Secondary | ICD-10-CM | POA: Insufficient documentation

## 2019-06-09 DIAGNOSIS — Z7902 Long term (current) use of antithrombotics/antiplatelets: Secondary | ICD-10-CM | POA: Diagnosis not present

## 2019-06-09 DIAGNOSIS — Z7982 Long term (current) use of aspirin: Secondary | ICD-10-CM | POA: Diagnosis not present

## 2019-06-09 DIAGNOSIS — N183 Chronic kidney disease, stage 3 unspecified: Secondary | ICD-10-CM | POA: Diagnosis present

## 2019-06-09 DIAGNOSIS — F419 Anxiety disorder, unspecified: Secondary | ICD-10-CM | POA: Diagnosis not present

## 2019-06-09 DIAGNOSIS — I639 Cerebral infarction, unspecified: Secondary | ICD-10-CM | POA: Diagnosis present

## 2019-06-09 DIAGNOSIS — R29810 Facial weakness: Secondary | ICD-10-CM | POA: Insufficient documentation

## 2019-06-09 DIAGNOSIS — Z20822 Contact with and (suspected) exposure to covid-19: Secondary | ICD-10-CM | POA: Diagnosis not present

## 2019-06-09 DIAGNOSIS — Z79899 Other long term (current) drug therapy: Secondary | ICD-10-CM | POA: Diagnosis not present

## 2019-06-09 DIAGNOSIS — F32A Depression, unspecified: Secondary | ICD-10-CM | POA: Insufficient documentation

## 2019-06-09 DIAGNOSIS — E1129 Type 2 diabetes mellitus with other diabetic kidney complication: Secondary | ICD-10-CM

## 2019-06-09 DIAGNOSIS — E119 Type 2 diabetes mellitus without complications: Secondary | ICD-10-CM | POA: Diagnosis present

## 2019-06-09 LAB — COMPREHENSIVE METABOLIC PANEL
ALT: 31 U/L (ref 0–44)
AST: 28 U/L (ref 15–41)
Albumin: 4.3 g/dL (ref 3.5–5.0)
Alkaline Phosphatase: 83 U/L (ref 38–126)
Anion gap: 10 (ref 5–15)
BUN: 18 mg/dL (ref 8–23)
CO2: 26 mmol/L (ref 22–32)
Calcium: 9.2 mg/dL (ref 8.9–10.3)
Chloride: 99 mmol/L (ref 98–111)
Creatinine, Ser: 1.24 mg/dL — ABNORMAL HIGH (ref 0.44–1.00)
GFR calc Af Amer: 54 mL/min — ABNORMAL LOW (ref >60)
GFR calc non Af Amer: 47 mL/min — ABNORMAL LOW (ref >60)
Glucose, Bld: 241 mg/dL — ABNORMAL HIGH (ref 70–99)
Potassium: 3.9 mmol/L (ref 3.5–5.1)
Sodium: 135 mmol/L (ref 135–145)
Total Bilirubin: 0.9 mg/dL (ref 0.3–1.2)
Total Protein: 7.9 g/dL (ref 6.5–8.1)

## 2019-06-09 LAB — CBC
HCT: 44.4 % (ref 36.0–46.0)
Hemoglobin: 15 g/dL (ref 12.0–15.0)
MCH: 30.6 pg (ref 26.0–34.0)
MCHC: 33.8 g/dL (ref 30.0–36.0)
MCV: 90.6 fL (ref 80.0–100.0)
Platelets: 255 10*3/uL (ref 150–400)
RBC: 4.9 MIL/uL (ref 3.87–5.11)
RDW: 12.6 % (ref 11.5–15.5)
WBC: 5.4 10*3/uL (ref 4.0–10.5)
nRBC: 0 % (ref 0.0–0.2)

## 2019-06-09 LAB — DIFFERENTIAL
Abs Immature Granulocytes: 0.01 10*3/uL (ref 0.00–0.07)
Basophils Absolute: 0 10*3/uL (ref 0.0–0.1)
Basophils Relative: 1 %
Eosinophils Absolute: 0 10*3/uL (ref 0.0–0.5)
Eosinophils Relative: 1 %
Immature Granulocytes: 0 %
Lymphocytes Relative: 25 %
Lymphs Abs: 1.3 10*3/uL (ref 0.7–4.0)
Monocytes Absolute: 0.3 10*3/uL (ref 0.1–1.0)
Monocytes Relative: 5 %
Neutro Abs: 3.7 10*3/uL (ref 1.7–7.7)
Neutrophils Relative %: 68 %

## 2019-06-09 LAB — URINE DRUG SCREEN, QUALITATIVE (ARMC ONLY)
Amphetamines, Ur Screen: NOT DETECTED
Barbiturates, Ur Screen: NOT DETECTED
Benzodiazepine, Ur Scrn: NOT DETECTED
Cannabinoid 50 Ng, Ur ~~LOC~~: NOT DETECTED
Cocaine Metabolite,Ur ~~LOC~~: NOT DETECTED
MDMA (Ecstasy)Ur Screen: NOT DETECTED
Methadone Scn, Ur: NOT DETECTED
Opiate, Ur Screen: NOT DETECTED
Phencyclidine (PCP) Ur S: NOT DETECTED
Tricyclic, Ur Screen: NOT DETECTED

## 2019-06-09 LAB — APTT: aPTT: 27 seconds (ref 24–36)

## 2019-06-09 LAB — GLUCOSE, CAPILLARY
Glucose-Capillary: 235 mg/dL — ABNORMAL HIGH (ref 70–99)
Glucose-Capillary: 225 mg/dL — ABNORMAL HIGH (ref 70–99)
Glucose-Capillary: 193 mg/dL — ABNORMAL HIGH (ref 70–99)

## 2019-06-09 LAB — PROTIME-INR
INR: 0.9 (ref 0.8–1.2)
Prothrombin Time: 11.9 seconds (ref 11.4–15.2)

## 2019-06-09 MED ORDER — ALBUTEROL SULFATE HFA 108 (90 BASE) MCG/ACT IN AERS: 2.0000 | INHALATION_SPRAY | RESPIRATORY_TRACT | Status: DC | PRN

## 2019-06-09 MED ORDER — INSULIN ASPART 100 UNIT/ML ~~LOC~~ SOLN
0.0000 [IU] | Freq: Three times a day (TID) | SUBCUTANEOUS | Status: DC
  Administered 2019-06-09 – 2019-06-10 (×3): 3 [IU] via SUBCUTANEOUS
  Filled 2019-06-09 (×3): qty 1

## 2019-06-09 MED ORDER — SENNOSIDES-DOCUSATE SODIUM 8.6-50 MG PO TABS: 1.0000 | ORAL_TABLET | Freq: Every evening | ORAL | Status: DC | PRN

## 2019-06-09 MED ORDER — ALUM & MAG HYDROXIDE-SIMETH 200-200-20 MG/5ML PO SUSP
30.0000 mL | Freq: Four times a day (QID) | ORAL | Status: DC | PRN
  Filled 2019-06-09: qty 30

## 2019-06-09 MED ORDER — ATORVASTATIN CALCIUM 20 MG PO TABS
80.0000 mg | ORAL_TABLET | Freq: Every evening | ORAL | Status: DC
  Administered 2019-06-09: 80 mg via ORAL
  Filled 2019-06-09: qty 4

## 2019-06-09 MED ORDER — ACETAMINOPHEN 325 MG PO TABS
650.0000 mg | ORAL_TABLET | Freq: Four times a day (QID) | ORAL | Status: DC | PRN
  Administered 2019-06-09 – 2019-06-10 (×2): 650 mg via ORAL
  Filled 2019-06-09 (×2): qty 2

## 2019-06-09 MED ORDER — CLOPIDOGREL BISULFATE 75 MG PO TABS
75.0000 mg | ORAL_TABLET | Freq: Every evening | ORAL | Status: DC
  Administered 2019-06-09: 75 mg via ORAL
  Filled 2019-06-09: qty 1

## 2019-06-09 MED ORDER — ALBUTEROL SULFATE (2.5 MG/3ML) 0.083% IN NEBU: 2.5000 mg | INHALATION_SOLUTION | RESPIRATORY_TRACT | Status: DC | PRN

## 2019-06-09 MED ORDER — ASPIRIN EC 81 MG PO TBEC
81.0000 mg | DELAYED_RELEASE_TABLET | Freq: Every evening | ORAL | Status: DC
  Administered 2019-06-09: 81 mg via ORAL
  Filled 2019-06-09: qty 1

## 2019-06-09 MED ORDER — PANTOPRAZOLE SODIUM 20 MG PO TBEC
20.0000 mg | DELAYED_RELEASE_TABLET | Freq: Every day | ORAL | Status: DC
  Administered 2019-06-09 – 2019-06-10 (×2): 20 mg via ORAL
  Filled 2019-06-09 (×2): qty 1

## 2019-06-09 MED ORDER — ONDANSETRON HCL 4 MG/2ML IJ SOLN
4.0000 mg | Freq: Three times a day (TID) | INTRAMUSCULAR | Status: DC | PRN
  Administered 2019-06-10: 4 mg via INTRAVENOUS
  Filled 2019-06-09: qty 2

## 2019-06-09 MED ORDER — STROKE: EARLY STAGES OF RECOVERY BOOK: Freq: Once | Status: AC

## 2019-06-09 MED ORDER — SODIUM CHLORIDE 0.9 % IV SOLN: INTRAVENOUS | Status: DC

## 2019-06-09 MED ORDER — INSULIN ASPART 100 UNIT/ML ~~LOC~~ SOLN: 0.0000 [IU] | Freq: Every day | SUBCUTANEOUS | Status: DC

## 2019-06-09 MED ORDER — HYDRALAZINE HCL 20 MG/ML IJ SOLN
5.0000 mg | INTRAMUSCULAR | Status: DC | PRN
  Filled 2019-06-09: qty 0.25

## 2019-06-09 MED ORDER — ENOXAPARIN SODIUM 40 MG/0.4ML ~~LOC~~ SOLN
40.0000 mg | SUBCUTANEOUS | Status: DC
  Administered 2019-06-09: 40 mg via SUBCUTANEOUS
  Filled 2019-06-09: qty 0.4

## 2019-06-09 NOTE — ED Provider Notes (Signed)
[facility] Emergency Department Provider Note ____________________________________________   First MD Initiated Contact with Patient 06/09/19 1446     (approximate)  I have reviewed the triage vital signs and the nursing notes.   HISTORY  Chief Complaint Code Stroke  HPI Tami Jones is a 64 y.o. female with history of CVA and hypertension presents via POV for acute onset right facial droop and slurred speech that started between 1230 to 1 PM this afternoon while eating lunch with her son.  Patient states that she was having some difficulty finding her words as well.  She does report some residual right side deficits after her CVA in June.  She is currently on Plavix and aspirin daily.  She took her medications approximately 1 hour prior to arrival.       Past Medical History:  Diagnosis Date  . Anxiety   . Asthma   . 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 Thad Ranger  . Diabetes mellitus   . GERD (gastroesophageal reflux disease)   . Hyperlipidemia   . Hypertension     Patient Active Problem List   Diagnosis Date Noted  . Hypertension   . HLD (hyperlipidemia)   . Type II diabetes mellitus with renal manifestations (HCC)   . CKD (chronic kidney disease), stage IIIa   . Stroke (HCC)   . Depression   . Gross hematuria 01/15/2019  . Dysuria 01/15/2019  . Right shoulder pain 12/04/2018  . Neuropathic pain, arm 10/26/2018  . MDD (major depressive disorder), single episode, moderate (HCC) 09/21/2018  . Hemiparesis of right dominant side (HCC) 08/17/2018  . SOB (shortness of breath) 03/08/2017  . Vestibular dizziness 01/21/2016  . Tick bite 07/15/2015  . Cerebrovascular disease 07/17/2014  . Preventative health care 06/25/2014  . Right sided sciatica 03/08/2013  . Chest pain 01/24/2012  . MENOPAUSAL SYNDROME 09/10/2008  . Mood disorder (HCC) 09/15/2007  . Essential hypertension, benign 08/10/2006  . Type 2  diabetes mellitus with other circulatory complications (HCC) 06/17/2006  . HYPERCHOLESTEROLEMIA 06/17/2006  . GERD 06/17/2006    Past Surgical History:  Procedure Laterality Date  . CARDIOVASCULAR STRESS TEST  1/15   myoview. EF 60%  . CESAREAN SECTION    . ESOPHAGOGASTRODUODENOSCOPY  05/2005  . TONSILLECTOMY AND ADENOIDECTOMY      Prior to Admission medications   Medication Sig Start Date End Date Taking? Authorizing Provider  acetaminophen (TYLENOL) 325 MG tablet Take 650 mg by mouth every 6 (six) hours as needed.   Yes [provider]  amLODipine (NORVASC) 5 MG tablet Take 1 tablet by mouth daily.  02/14/18  Yes [provider]  aspirin EC 81 MG tablet Take 81 mg by mouth every evening.    Yes [provider]  atorvastatin (LIPITOR) 80 MG tablet Take 1 tablet (80 mg total) by mouth daily. Patient taking differently: Take 80 mg by mouth every evening.  09/21/18 09/21/19 Yes Karie Schwalbe, MD  clopidogrel (PLAVIX) 75 MG tablet Take 1 tablet by mouth every evening.  08/15/18 08/15/19 Yes [provider]  glipiZIDE (GLUCOTROL XL) 5 MG 24 hr tablet TAKE 1 TABLET BY MOUTH EVERYDAY WITH BREAKFAST 08/24/18  Yes Karie Schwalbe, MD  lansoprazole (PREVACID) 30 MG capsule Take 1 capsule (30 mg total) by mouth daily at 12 noon. 08/17/18  Yes Karie Schwalbe, MD  lisinopril-hydrochlorothiazide (ZESTORETIC) 20-25 MG tablet Take 1 tablet by mouth every morning. 04/02/19  Yes Karie Schwalbe, MD  Allergies Citalopram, Doxycycline hyclate, Erythromycin, Montelukast sodium, Nitrofurantoin, Sulfa antibiotics, Tramadol hcl, Venlafaxine hcl, Amoxicillin-pot clavulanate, and Clarithromycin  Family History  Problem Relation Age of Onset  . Coronary artery disease Mother   . Diabetes Mellitus II Mother   . Heart attack Mother 4  . Diabetes Mellitus II Maternal Grandmother     Social History Social History   Tobacco Use  . Smoking status: Never Smoker  .  Smokeless tobacco: Never Used  Substance Use Topics  . Alcohol use: No    Alcohol/week: 0.0 standard drinks    Comment: wine occassionally  . Drug use: No    Review of Systems  Constitutional: No fever/chills Eyes: No visual changes. ENT: Positive for right-sided facial droop. Cardiovascular: Denies chest pain. Respiratory: Denies shortness of breath. Gastrointestinal: No abdominal pain.  No nausea, no vomiting.  No diarrhea.  No constipation. Genitourinary: Negative for dysuria. Musculoskeletal: Negative for back pain. Skin: Negative for rash. Neurological: Negative for headaches, focal weakness or numbness.  ____________________________________________   PHYSICAL EXAM:  VITAL SIGNS: ED Triage Vitals  Enc Vitals Group     BP 06/09/19 1344 (!) 205/109     Pulse Rate 06/09/19 1344 (!) 122     Resp 06/09/19 1344 19     Temp 06/09/19 1347 97.9 F (36.6 C)     Temp Source 06/09/19 1347 Oral     SpO2 06/09/19 1344 99 %     Weight 06/09/19 1342 245 lb 2.4 oz (111.2 kg)     Height 06/09/19 1342 5\' 4"  (1.626 m)     Head Circumference --      Peak Flow --      Pain Score 06/09/19 1341 0     Pain Loc --      Pain Edu? --      Excl. in GC? --     Constitutional: Alert and oriented. Well appearing and in no acute distress. Eyes: Conjunctivae are normal. PERRL. EOMI. Head: Atraumatic. Nose: No congestion/rhinnorhea. Mouth/Throat: Mucous membranes are moist.  Oropharynx non-erythematous. Neck: No stridor.   Hematological/Lymphatic/Immunilogical: No cervical lymphadenopathy. Cardiovascular: Normal rate, regular rhythm. Grossly normal heart sounds.  Good peripheral circulation. Respiratory: Normal respiratory effort.  No retractions. Lungs CTAB. Gastrointestinal: Soft and nontender. No distention. No abdominal bruits. No CVA tenderness. Genitourinary:  Musculoskeletal: No lower extremity tenderness nor edema.  No joint effusions. Neurologic: Right side facial droop;  difficulty finding words; weakness in right upper extremity in comparison to left, no pronotor drift, performs independent leg raise bilaterally, some difficulty on the right, heel to shin performed bilaterally with some difficulty on the right as well. Skin:  Skin is warm, dry and intact. No rash noted. Psychiatric: Mood and affect are normal. Speech and behavior are normal.  ____________________________________________   LABS (all labs ordered are listed, but only abnormal results are displayed)  Labs Reviewed  COMPREHENSIVE METABOLIC PANEL - Abnormal; Notable for the following components:      Result Value   Glucose, Bld 241 (*)    Creatinine, Ser 1.24 (*)    GFR calc non Af Amer 47 (*)    GFR calc Af Amer 54 (*)    All other components within normal limits  GLUCOSE, CAPILLARY - Abnormal; Notable for the following components:   Glucose-Capillary 235 (*)    All other components within normal limits  SARS CORONAVIRUS 2 (TAT 6-24 HRS)  PROTIME-INR  APTT  CBC  DIFFERENTIAL  URINE DRUG SCREEN, QUALITATIVE ([facility] ONLY)  CBG MONITORING, ED  ____________________________________________  EKG  ED ECG REPORT I, Kem Boroughs, FNP-BC personally viewed and interpreted this ECG.   Date: 06/09/2019  EKG Time: 1344  Rate: 123  Rhythm: sinus tachycardia  Axis: normal  Intervals:none  ST&T Change: no ST changes  ____________________________________________  RADIOLOGY  ED MD interpretation:    No acute findings on head CT  Official radiology report(s): CT HEAD CODE STROKE WO CONTRAST  Addendum Date: 06/09/2019   ADDENDUM REPORT: 06/09/2019 13:56 ADDENDUM: Study discussed by telephone with Dr. Chesley Noon on 06/09/2019 at 1350 hours. Electronically Signed   By: Odessa Fleming M.D.   On: 06/09/2019 13:56   Result Date: 06/09/2019 CLINICAL DATA:  Code stroke. 64 year old female with abnormal speech and right facial droop. Symptom onset 1230 hours. EXAM: CT HEAD WITHOUT CONTRAST  TECHNIQUE: Contiguous axial images were obtained from the base of the skull through the vertex without intravenous contrast. COMPARISON:  Head CT 03/14/2019. Brain MRI 08/04/2018. FINDINGS: Brain: Chronic bilateral PCA territory infarct and bilateral basal ganglia lacunar infarcts appear stable since January. Additional left corona radiata white matter hypodensity is stable. No superimposed acute cortically based infarct identified. No midline shift, mass effect, or evidence of intracranial mass lesion. No acute intracranial hemorrhage identified. No ventriculomegaly. Vascular: Calcified atherosclerosis at the skull base. Stable vascular density, no suspicious vessel identified. Skull: Stable, negative. Sinuses/Orbits: Visualized paranasal sinuses and mastoids are stable and well pneumatized. Other: Visualized orbits and scalp soft tissues are within normal limits. ASPECTS [facility] Stroke Program Early CT Score) Total score (0-10 with 10 being normal): 10 (chronic encephalomalacia). IMPRESSION: 1. No acute cortically based infarct or acute intracranial hemorrhage identified. ASPECTS 10. 2. Chronic ischemic disease in the bilateral basal ganglia, left corona radiata, bilateral occipital poles. Electronically Signed: By: Odessa Fleming M.D. On: 06/09/2019 13:47    ____________________________________________   PROCEDURES  Procedure(s) performed (including Critical Care):  Procedures  ____________________________________________   INITIAL IMPRESSION / ASSESSMENT AND PLAN     64 year old female presents to the emergency department for concern for CVA. See HPI for further details.  DIFFERENTIAL DIAGNOSIS  TIA, CVA, Bell's Palsy  ED COURSE  Patient returned from CT. Able to stand from wheelchair and take coordinated steps toward bed. TeleNeuro evaluation requested and in the room. Nursing staff at bedside performing protocol orders.  ----------------------------------------- 1:56 PM on 06/09/2019  -----------------------------------------  Dr. Leroy Libman began neuro evaluation via telemedicine. Patient now relaying that she has a numb sensation to the right cheek and lips feel different. Otherwise, neuro exam unchanged from that documented in assessment.  ----------------------------------------- 2:06 PM on 06/09/2019 -----------------------------------------  TPA not currently recommended.   ----------------------------------------- 2:57 PM on 06/09/2019 -----------------------------------------  No emergent CTA recommended. She will be admitted for further work up. Speech no longer slurred. Hospitalist has agreed to accept her for admission.  CRITICAL CARE Performed by: Kem Boroughs   Total critical care time: 30 minutes  Critical care time was exclusive of separately billable procedures and treating other patients.  Critical care was necessary to treat or prevent imminent or life-threatening deterioration.  Critical care was time spent personally by me on the following activities: development of treatment plan with patient and/or surrogate as well as nursing, discussions with consultants, evaluation of patient's response to treatment, examination of patient, obtaining history from patient or surrogate, ordering and performing treatments and interventions, ordering and review of laboratory studies, ordering and review of radiographic studies, pulse oximetry and re-evaluation of patient's condition. ____________________________________________   FINAL CLINICAL IMPRESSION(S) /  ED DIAGNOSES  Final diagnoses:  TIA (transient ischemic attack)     ED Discharge Orders    None       Wandalene Abrams Mancera was evaluated in Emergency Department on 06/09/2019 for the symptoms described in the history of present illness. She was evaluated in the context of the global COVID-19 pandemic, which necessitated consideration that the patient might be at risk for infection with the SARS-CoV-2  virus that causes COVID-19. Institutional protocols and algorithms that pertain to the evaluation of patients at risk for COVID-19 are in a state of rapid change based on information released by regulatory bodies including the CDC and federal and state organizations. These policies and algorithms were followed during the patient's care in the ED.   Note:  This document was prepared using Dragon voice recognition software and may include unintentional dictation errors.   Victorino Dike, FNP 06/09/19 1521    Blake Divine, MD 06/09/19 1555

## 2019-06-09 NOTE — H&P (Signed)
History and Physical    Tami Jones JYN:[id number] DOB: 06/15/1955 DOA: 06/09/2019  Referring MD/NP/PA:   PCP: Venia Carbon, MD   Patient coming from:  The patient is coming from home.  At baseline, pt is independent for most of ADL.        Chief Complaint: Slurred speech, right facial droop  HPI: Tami Jones is a 64 y.o. female with medical history significant of hypertension, hyperlipidemia, diabetes mellitus, asthma, stroke with right-sided weakness, GERD, depression, anxiety, CKD stage III, who presents with slurred speech, right facial droop.  Patient has a history of stroke with right-sided weakness. She developed acute onset right facial droop and slurred speech that started between 12:30 to 1 PM this afternoon while eating lunch. Patient states that she was having some difficulty finding her words as well.  Patient has chronic right-sided weakness, which has not changed significantly.  No new vision change or hearing loss.  Patient does not have chest pain, cough, shortness of breath.  No fever or chills.  No nausea vomiting, diarrhea, abdominal pain, symptoms of UTI.  ED Course: pt was found to have WBC 5.4, INR 0.9, PTT twenty-seven, pending COVID-19 PCR, renal function slightly worsening, temperature normal, blood pressure 205/109, tachycardia, oxygen saturation 98% on room air, RR 15.  CT head is negative for acute intracranial abnormalities.  Telemetry neurology, Dr. Shara Blazing and neurologist, Dr. Irish Elders were consulted.  Review of Systems:   General: no fevers, chills, no body weight gain, has fatigue HEENT: no blurry vision, hearing changes or sore throat Respiratory: no dyspnea, coughing, wheezing CV: no chest pain, no palpitations GI: no nausea, vomiting, abdominal pain, diarrhea, constipation GU: no dysuria, burning on urination, increased urinary frequency, hematuria  Ext: no leg edema Neuro: No vision change or hearing loss.  Has slurred speech, right facial  droop, chronic right-sided weakness Skin: no rash, no skin tear. MSK: No muscle spasm, no deformity, no limitation of range of movement in spin Heme: No easy bruising.  Travel history: No recent long distant travel.  Allergy:  Allergies  Allergen Reactions  . Citalopram Other (See Comments)    Feels odd  . Doxycycline Hyclate Nausea And Vomiting  . Erythromycin Other (See Comments)    Irritated stomach  . Montelukast Sodium Itching  . Nitrofurantoin Nausea And Vomiting  . Sulfa Antibiotics Nausea And Vomiting  . Tramadol Hcl Other (See Comments)    Feels odd  . Venlafaxine Hcl Other (See Comments)    Feels odd  . Amoxicillin-Pot Clavulanate Nausea And Vomiting  . Clarithromycin Rash     See ER note 02/2017    Past Medical History:  Diagnosis Date  . Anxiety   . Asthma   . CVA (cerebral vascular accident) ([place]) 03/2006   right occipital, Dr. Doy Mince  . Diabetes mellitus   . GERD (gastroesophageal reflux disease)   . Hyperlipidemia   . Hypertension     Past Surgical History:  Procedure Laterality Date  . CARDIOVASCULAR STRESS TEST  1/15   myoview. EF 60%  . CESAREAN SECTION    . ESOPHAGOGASTRODUODENOSCOPY  05/2005  . TONSILLECTOMY AND ADENOIDECTOMY      Social History:  reports that she has never smoked. She has never used smokeless tobacco. She reports that she does not drink alcohol or use drugs.  Family History:  Family History  Problem Relation Age of Onset  . Coronary artery disease Mother   . Diabetes Mellitus II Mother   . Heart attack Mother  63  . Diabetes Mellitus II Maternal Grandmother      Prior to Admission medications   Medication Sig Start Date End Date Taking? Authorizing Provider  amLODipine (NORVASC) 5 MG tablet Take 1 tablet by mouth 2 (two) times daily. 02/14/18   [provider]  aspirin EC 81 MG tablet Take 81 mg by mouth daily.    [provider]  atorvastatin (LIPITOR) 80 MG tablet Take 1 tablet (80 mg total) by  mouth daily. 09/21/18 09/21/19  Tillman Abide I, MD  clopidogrel (PLAVIX) 75 MG tablet Take 1 tablet by mouth daily. 08/15/18 08/15/19  [provider]  DULoxetine (CYMBALTA) 30 MG capsule Take 1 capsule (30 mg total) by mouth daily. 04/02/19   Karie Schwalbe, MD  glipiZIDE (GLUCOTROL XL) 5 MG 24 hr tablet TAKE 1 TABLET BY MOUTH EVERYDAY WITH BREAKFAST 08/24/18   Tillman Abide I, MD  lansoprazole (PREVACID) 30 MG capsule Take 1 capsule (30 mg total) by mouth daily at 12 noon. 08/17/18   Karie Schwalbe, MD  lisinopril-hydrochlorothiazide (ZESTORETIC) 20-25 MG tablet Take 1 tablet by mouth every morning. 04/02/19   Tillman Abide I, MD  nystatin (MYCOSTATIN/NYSTOP) powder APPLY EXTERNALLY TO THE AFFECTED AREA TWICE DAILY 10/26/18   Karie Schwalbe, MD    Physical Exam: Vitals:   06/09/19 1347 06/09/19 1430 06/09/19 1500 06/09/19 1530  BP:   (!) 167/89 140/83  Pulse:  (!) 106 (!) 105 (!) 103  Resp:  15 15 13   Temp: 97.9 F (36.6 C)     TempSrc: Oral     SpO2:  98% 97% 96%  Weight:      Height:       General: Not in acute distress HEENT:       Eyes: PERRL, EOMI, no scleral icterus.       ENT: No discharge from the ears and nose, no pharynx injection, no tonsillar enlargement.        Neck: No JVD, no bruit, no mass felt. Heme: No neck lymph node enlargement. Cardiac: S1/S2, RRR, No murmurs, No gallops or rubs. Respiratory: No rales, wheezing, rhonchi or rubs. GI: Soft, nondistended, nontender, no rebound pain, no organomegaly, BS present. GU: No hematuria Ext: No pitting leg edema bilaterally. 2+DP/PT pulse bilaterally. Musculoskeletal: No joint deformities, No joint redness or warmth, no limitation of ROM in spin. Skin: No rashes.  Neuro: Alert, oriented X3, cranial nerves II-XII grossly intact except for right facial droop. Muscle strength 4/5 in right arm and right leg, 5/5 in left extremities, sensation to light touch intact is decreased in the right arm. Psych: Patient is  not psychotic, no suicidal or hemocidal ideation.  Labs on Admission: I have personally reviewed following labs and imaging studies  CBC: Recent Labs  Lab 06/09/19 1333  WBC 5.4  NEUTROABS 3.7  HGB 15.0  HCT 44.4  MCV 90.6  PLT 255   Basic Metabolic Panel: Recent Labs  Lab 06/09/19 1333  NA 135  K 3.9  CL 99  CO2 26  GLUCOSE 241*  BUN 18  CREATININE 1.24*  CALCIUM 9.2   GFR: Estimated Creatinine Clearance: 57.4 mL/min (A) (by C-G formula based on SCr of 1.24 mg/dL (H)). Liver Function Tests: Recent Labs  Lab 06/09/19 1333  AST 28  ALT 31  ALKPHOS 83  BILITOT 0.9  PROT 7.9  ALBUMIN 4.3   No results for input(s): LIPASE, AMYLASE in the last 168 hours. No results for input(s): AMMONIA in the last 168 hours.  Coagulation Profile: Recent Labs  Lab 06/09/19 1333  INR 0.9   Cardiac Enzymes: No results for input(s): CKTOTAL, CKMB, CKMBINDEX, TROPONINI in the last 168 hours. BNP (last 3 results) No results for input(s): PROBNP in the last 8760 hours. HbA1C: No results for input(s): HGBA1C in the last 72 hours. CBG: Recent Labs  Lab 06/09/19 1329  GLUCAP 235*   Lipid Profile: No results for input(s): CHOL, HDL, LDLCALC, TRIG, CHOLHDL, LDLDIRECT in the last 72 hours. Thyroid Function Tests: No results for input(s): TSH, T4TOTAL, FREET4, T3FREE, THYROIDAB in the last 72 hours. Anemia Panel: No results for input(s): VITAMINB12, FOLATE, FERRITIN, TIBC, IRON, RETICCTPCT in the last 72 hours. Urine analysis:    Component Value Date/Time   COLORURINE YELLOW (A) 08/03/2018 1927   APPEARANCEUR HAZY (A) 08/03/2018 1927   LABSPEC 1.014 08/03/2018 1927   PHURINE 6.0 08/03/2018 1927   GLUCOSEU >=500 (A) 08/03/2018 1927   HGBUR NEGATIVE 08/03/2018 1927   HGBUR negative 02/05/2010 1336   BILIRUBINUR Negative 01/15/2019 1508   KETONESUR NEGATIVE 08/03/2018 1927   PROTEINUR Negative 01/15/2019 1508   PROTEINUR NEGATIVE 08/03/2018 1927   UROBILINOGEN 0.2 01/15/2019  1508   UROBILINOGEN 0.2 02/05/2010 1336   NITRITE Negative 01/15/2019 1508   NITRITE NEGATIVE 08/03/2018 1927   LEUKOCYTESUR Negative 01/15/2019 1508   LEUKOCYTESUR TRACE (A) 08/03/2018 1927   Sepsis Labs: @LABRCNTIP (procalcitonin:4,lacticidven:4) )No results found for this or any previous visit (from the past 240 hour(s)).   Radiological Exams on Admission: CT HEAD CODE STROKE WO CONTRAST  Addendum Date: 06/09/2019   ADDENDUM REPORT: 06/09/2019 13:56 ADDENDUM: Study discussed by telephone with Dr. 06/11/2019 on 06/09/2019 at 1350 hours. Electronically Signed   By: 06/11/2019 M.D.   On: 06/09/2019 13:56   Result Date: 06/09/2019 CLINICAL DATA:  Code stroke. 64 year old female with abnormal speech and right facial droop. Symptom onset 1230 hours. EXAM: CT HEAD WITHOUT CONTRAST TECHNIQUE: Contiguous axial images were obtained from the base of the skull through the vertex without intravenous contrast. COMPARISON:  Head CT 03/14/2019. Brain MRI 08/04/2018. FINDINGS: Brain: Chronic bilateral PCA territory infarct and bilateral basal ganglia lacunar infarcts appear stable since January. Additional left corona radiata white matter hypodensity is stable. No superimposed acute cortically based infarct identified. No midline shift, mass effect, or evidence of intracranial mass lesion. No acute intracranial hemorrhage identified. No ventriculomegaly. Vascular: Calcified atherosclerosis at the skull base. Stable vascular density, no suspicious vessel identified. Skull: Stable, negative. Sinuses/Orbits: Visualized paranasal sinuses and mastoids are stable and well pneumatized. Other: Visualized orbits and scalp soft tissues are within normal limits. ASPECTS [facility] Stroke Program Early CT Score) Total score (0-10 with 10 being normal): 10 (chronic encephalomalacia). IMPRESSION: 1. No acute cortically based infarct or acute intracranial hemorrhage identified. ASPECTS 10. 2. Chronic ischemic disease in the  bilateral basal ganglia, left corona radiata, bilateral occipital poles. Electronically Signed: By: [facility] M.D. On: 06/09/2019 13:47     EKG: Independently reviewed.  Sinus tachycardia, QTC 401, low voltage, LAE, early R wave progression   Assessment/Plan Principal Problem:   Stroke [facility]) Active Problems:   GERD   Hypertension   HLD (hyperlipidemia)   Type II diabetes mellitus with renal manifestations (HCC)   CKD (chronic kidney disease), stage IIIa   Stroke vs. TIA: Patient has history of stroke with right-sided weakness.  She developed slurred speech, right facial droop today. Her slurred speech seem to have been improving.  CT of her head is negative for acute intracranial abnormalities.  The symptoms are concerning for stroke versus TIA now. Telemetry neurology, Dr. Haynes Dage is consulted who recommended to activate Stroke Protocol Admission/Order Set and continue ASA and plavix.  Dr. Loretha Brasil of our neurologist is also consulted.  - Placed on MedSurg bed for observation - will follow up Neurology's Recs.  - Obtain MRI of brain--> for other images, will f/u Dr. Renee Rival recommendation - will hold oral Bp meds to allow permissive HTN in the setting of acute stroke  - Check carotid dopplers  - Plavix and add ASA  - fasting lipid panel and HbA1c  - 2D transthoracic echocardiography  - swallowing screen. If fails, will get SLP - Check UDS  - PT/OT consult  GERD -Protonix  Hypertension -- will hold oral Bp meds to allow permissive HTN in the setting of acute stroke  -As needed hydralazine for SBP > 220, or DBP >120  HLD (hyperlipidemia) -Lipitor  Type II diabetes mellitus with renal manifestations (HCC): Most recent A1c 7.7, poorly controled. Patient is taking glipizide at home -SSI  CKD (chronic kidney disease), stage IIIa: Slightly worsened than baseline.  Baseline creatinine is ~ 1.0.  Her creatinine is 1.24, BUN 18. -Hold Prinzide  -IV fluid: Normal saline 75  cc/h   DVT ppx: SQ Lovenox Code Status: Full code Family Communication:   Yes, patient's daughter by phone Disposition Plan:  Anticipate discharge back to previous home environment Consults called:  Telemetry neurology, Dr. Haynes Dage and neurologist, Dr. Loretha Brasil were consulted. Admission status: Med-surg bed for obs   Status is: Observation The patient remains OBS appropriate and will d/c before 2 midnights. Dispo: The patient is from: Home              Anticipated d/c is to: Home              Anticipated d/c date is: 1 day              Patient currently is not medically stable to d/c.         Date of Service 06/09/2019    Lorretta Harp [facility]   If 7PM-7AM, please contact night-coverage [email or web address] 06/09/2019, 4:25 PM

## 2019-06-09 NOTE — Progress Notes (Signed)
   06/09/19 1345  Clinical Encounter Type  Visited With Patient  Visit Type Initial  Referral From Nurse  Consult/Referral To Chaplain  Advance Directives (For Healthcare)  Does Patient Have a Medical Advance Directive? No  Would patient like information on creating a medical advance directive? No - Patient declined  Mental Health Advance Directives  Does Patient Have a Mental Health Advance Directive? No  Would patient like information on creating a mental health advance directive? No - Patient declined  CH visited with PT.However, PT was in CT. CH said silent prayer.

## 2019-06-09 NOTE — ED Triage Notes (Signed)
Pt presents to ED c/o possible CVA. Pt states she was at lunch with son and noticed slurred speech, expressive aphasia, and R-sided facial droop between 1230 and 1300. Hx previous CVA in June 2020 with residual R-sided weakness. Pt states speech seems better now, exhibits some word-searching/delayed responses but is able to answer questions appropriately.

## 2019-06-09 NOTE — Consult Note (Signed)
[facility] Consult Services   Date of Service:   06/09/2019 13:43:24  Impression:     .  G45.9 - Transient cerebral ischemic attack, unspecified  Comments/Sign-Out: Patient with transient slurred speech and left facial numbness. She is back to her baseline other then minor sensory change around her mouth. Would not recommend IV alteplase at this time but admission for further work up.  Metrics: Last Known Well: 06/09/2019 12:30:00 [facility] Notification Time: 06/09/2019 13:43:24 Arrival Time: 06/09/2019 13:22:00 Stamp Time: 06/09/2019 13:43:24 Time First Login Attempt: 06/09/2019 13:51:00 Symptoms: right sided weakness and slurred speech NIHSS Start Assessment Time: 06/09/2019 13:57:22 Patient is not a candidate for Alteplase/Activase. Alteplase Medical Decision: 06/09/2019 13:58:52 Patient was not deemed candidate for Alteplase/Activase thrombolytics because of Resolved symptoms (no residual disabling symptoms).  CT head showed no acute hemorrhage or acute core infarct.  Clinical Presentation is not Suggestive of Large Vessel Occlusive Disease  ED Physician notified of diagnostic impression and management plan on 06/09/2019 14:09:38  Our recommendations are outlined below.  Recommendations:     .  Activate Stroke Protocol Admission/Order Set     .  Stroke/Telemetry Floor     .  Neuro Checks     .  Bedside Swallow Eval     .  DVT Prophylaxis     .  IV Fluids, Normal Saline     .  Head of Bed 30 Degrees     .  Euglycemia and Avoid Hyperthermia (PRN Acetaminophen)     .  Initiate Aspirin 81 MG Daily     .  Initiate Plavix 75 MG Daily  Routine Consultation with [facility] for Follow up Care  Sign Out:     .  Discussed with Emergency Department Provider    ------------------------------------------------------------------------------  History of Present Illness: Patient is a 64 year old Female.  Patient was brought by  private transportation with symptoms of right sided weakness and slurred speech  64 yo F with history of stroke, htn, hl and dm who is presenting with right facial droop, numbness, and word finding issues. She feels weaker on her right side all day. She currently has a weird feeling around her mouth but her speech is back to normal.   Past Medical History:     . Hypertension     . Diabetes Mellitus     . Hyperlipidemia     . Stroke     . There is NO history of Atrial Fibrillation     . There is NO history of Coronary Artery Disease  Anticoagulant use:  No  Antiplatelet use: aspirin and plavix    Examination: BP(205/109), Pulse(122), Blood Glucose(235) 1A: Level of Consciousness - Alert; keenly responsive + 0 1B: Ask Month and Age - Both Questions Right + 0 1C: Blink Eyes & Squeeze Hands - Performs Both Tasks + 0 2: Test Horizontal Extraocular Movements - Normal + 0 3: Test Visual Fields - Partial Hemianopia + 1 4: Test Facial Palsy (Use Grimace if Obtunded) - Normal symmetry + 0 5A: Test Left Arm Motor Drift - No Drift for 10 Seconds + 0 5B: Test Right Arm Motor Drift - Drift, but doesn't hit bed + 1 6A: Test Left Leg Motor Drift - Drift, but doesn't hit bed + 1 6B: Test Right Leg Motor Drift - Drift, but doesn't hit bed + 1 7: Test Limb Ataxia (FNF/Heel-Shin) - No Ataxia + 0 8: Test Sensation - Mild-Moderate Loss: Can Sense Being Touched + 1 9:  Test Language/Aphasia - Normal; No aphasia + 0 10: Test Dysarthria - Normal + 0 11: Test Extinction/Inattention - No abnormality + 0  NIHSS Score: 5  Pre-Morbid Modified Ranking Scale: 2 Points = Slight disability; unable to carry out all previous activities, but able to look after own affairs without assistance   Patient/Family was informed the Neurology Consult would occur via TeleHealth consult by way of interactive audio and video telecommunications and consented to receiving care in this manner.     Dr Ginette Pitman   [facility] [phone]  Case [id number]

## 2019-06-09 NOTE — ED Triage Notes (Signed)
FIRST NURSE NOTE:  Pt arrived via POV with son in law, was eating lunch and started having slurred speech, aphasia and right facial droop.  Sxs began between 1230 and 1pm.  Per son in law who brought patient in, he was with patient prior to sxs starting and was normal.  CBG checked =235, pt was transported to CT via wheelchair.  Pt alert and oriented at this time, respirations equal and unlabored.

## 2019-06-10 ENCOUNTER — Observation Stay
Admit: 2019-06-10 | Discharge: 2019-06-10 | Disposition: A | Discharge status: Home / Self Care | Payer: 59 | Attending: Internal Medicine | Admitting: Internal Medicine

## 2019-06-10 ENCOUNTER — Observation Stay: Payer: 59

## 2019-06-10 DIAGNOSIS — I63412 Cerebral infarction due to embolism of left middle cerebral artery: Secondary | ICD-10-CM

## 2019-06-10 DIAGNOSIS — E7849 Other hyperlipidemia: Secondary | ICD-10-CM | POA: Diagnosis not present

## 2019-06-10 DIAGNOSIS — I10 Essential (primary) hypertension: Secondary | ICD-10-CM | POA: Diagnosis not present

## 2019-06-10 LAB — GLUCOSE, CAPILLARY
Glucose-Capillary: 205 mg/dL — ABNORMAL HIGH (ref 70–99)
Glucose-Capillary: 203 mg/dL — ABNORMAL HIGH (ref 70–99)

## 2019-06-10 LAB — LIPID PANEL
Cholesterol: 234 mg/dL — ABNORMAL HIGH (ref 0–200)
HDL: 31 mg/dL — ABNORMAL LOW (ref >40)
LDL Cholesterol: 170 mg/dL — ABNORMAL HIGH (ref 0–99)
Total CHOL/HDL Ratio: 7.5 RATIO
Triglycerides: 163 mg/dL — ABNORMAL HIGH (ref <150)
VLDL: 33 mg/dL (ref 0–40)

## 2019-06-10 LAB — HEMOGLOBIN A1C
Hgb A1c MFr Bld: 8.3 % — ABNORMAL HIGH (ref 4.8–5.6)
Mean Plasma Glucose: 191.51 mg/dL

## 2019-06-10 LAB — ECHOCARDIOGRAM COMPLETE
Height: 64 in
Weight: 3922.42 [oz_av]

## 2019-06-10 LAB — SARS CORONAVIRUS 2 (TAT 6-24 HRS): SARS Coronavirus 2: NEGATIVE

## 2019-06-10 NOTE — Progress Notes (Signed)
Patient is being discharged home this afternoon with [facility]. DC instructions given and patient /daughter acknowledged understanding. IV removed and belongings packed. NT helped patient dress for transport.

## 2019-06-10 NOTE — Plan of Care (Signed)
Pt has residual right side weakness from a previous CVA. Pt currently does not have any facial droop but does have some sensory deficits on the right side. No signs of distress noted. Will continue to monitor.

## 2019-06-10 NOTE — TOC Transition Note (Signed)
Transition of Care [facility]) - CM/SW Discharge Note   Patient Details  Name: Tami Jones MRN: [id number] Date of Birth: 1955-03-18  Transition of Care [facility]) CM/SW Contact:  Maud Deed, LCSW Phone Number: 06/10/2019, 2:57 PM   Clinical Narrative:    CSW was consulted by MD for pt to be setup with [facility]. CSW contacted Barbara Cower with [facility] and he is able to accept pt. CSW spoke with pt and she stated that she already has a walker and no other DME is needed.     Final next level of care: Home w Home Health Services Barriers to Discharge: No Barriers Identified   Patient Goals and CMS Choice        Discharge Placement                  Name of family member notified: Judeth Cornfield Patient and family notified of of transfer: 06/10/19  Discharge Plan and Services                          HH Arranged: PT, OT HH Agency: [facility] Date HH Agency Contacted: 06/10/19 Time HH Agency Contacted: 1456 Representative spoke with at [facility] Agency: Barbara Cower  Social Determinants of Health (SDOH) Interventions     Readmission Risk Interventions No flowsheet data found.

## 2019-06-10 NOTE — Discharge Summary (Signed)
Physician Discharge Summary  Tami SalviaCynthia B Jones [id number] DOB: 07/14/1955 DOA: 06/09/2019  PCP: Karie SchwalbeLetvak, Richard I, MD  Admit date: 06/09/2019 Discharge date: 06/10/2019  Admitted From: home  Disposition:  Home w/ home health   Recommendations for Outpatient Follow-up:  1. Follow up with PCP in 1-2 weeks 2. F/u neuro in 1 week  Home Health: yes  Equipment/Devices:  Discharge Condition: stable  CODE STATUS: full  Diet recommendation: Heart Healthy / Carb Modified  Brief/Interim Summary: HPI was taken from Dr. Clyde LundborgNiu: Tami Salviaynthia B Jones is a 64 y.o. female with medical history significant of hypertension, hyperlipidemia, diabetes mellitus, asthma, stroke with right-sided weakness, GERD, depression, anxiety, CKD stage III, who presents with slurred speech, right facial droop.  Patient has a history of stroke with right-sided weakness. She developed acute onsetright facial droop and slurred speech that started between 12:30 to 1 PM this afternoon while eating lunch. Patient states that she was having some difficulty finding her words as well.  Patient has chronic right-sided weakness, which has not changed significantly.  No new vision change or hearing loss.  Patient does not have chest pain, cough, shortness of breath.  No fever or chills.  No nausea vomiting, diarrhea, abdominal pain, symptoms of UTI.  ED Course: pt was found to have WBC 5.4, INR 0.9, PTT twenty-seven, pending COVID-19 PCR, renal function slightly worsening, temperature normal, blood pressure 205/109, tachycardia, oxygen saturation 98% on room air, RR 15.  CT head is negative for acute intracranial abnormalities.  Telemetry neurology, Dr. Haynes DageSevilis and neurologist, Dr. Loretha BrasilZeylikman were consulted.  Hospital Course from Dr. Wilfred LacyJ. 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 MRI brain showed left posterior frontal lobe w/ embolic infarctions in MCA territory. Pt has no hx of a.  fib/flutter and it was not found on tele while in the hospital. Consider outpatient Holter monitor for further evaluation. Echo was done which showed EF 60-65%, no wall motion abnormalities and no atrial level shunt detected. Neuro was consulted and recommended continue aspirin and plavix. PT/OT saw the pt and recommended home health. Home health was set up by CM prior to d/c.  Discharge Diagnoses:  Principal Problem:   Stroke [facility](HCC) Active Problems:   GERD   Hypertension   HLD (hyperlipidemia)   Type II diabetes mellitus with renal manifestations (HCC)   CKD (chronic kidney disease), stage IIIa  CVA: history of stroke with right-sided weakness but developed slurred speech, right facial droop 06/09/19.CT head is negative for acute intracranial abnormalities. MRI brain shows left posterior frontal lobe consistent w/ embolic infarctions in MCA territory. No hx of a.fib/flutter. Continue on tele. Continue aspirin and plavix.Neuro consulted. Echo shows EF 60-65% & no atrial shunts were detected. Allow permissive HTN in the setting of acute stroke. PT/OT recs home health   GERD: continue on protonix  Hypertension: will hold oral BP meds to allow permissive HTN in the setting of acute stroke. Hydralazine for SBP>220, or DBP >120  HLD: continue on statin   DM2: HbA1c7.7, poorly controled. Continue to hold glipizide. Continue on SSI w/ accuchecks   CKDIIIa:baseline creatinine is ~ 1.0. Cr is trending up slightly today. Continue on IVFs  Morbid obesity: BMI 42.0. Would benefit from weight loss   Discharge Instructions  Discharge Instructions    Diet - low sodium heart healthy   Complete by: As directed    Diet Carb Modified   Complete by: As directed    Discharge instructions  Complete by: As directed    F/u PCP in 1-2 weeks; F/u neuro in 1 week   Increase activity slowly   Complete by: As directed      Allergies as of 06/10/2019      Reactions   Citalopram Other (See  Comments)   Feels odd   Doxycycline Hyclate Nausea And Vomiting   Erythromycin Other (See Comments)   Irritated stomach   Montelukast Sodium Itching   Nitrofurantoin Nausea And Vomiting   Sulfa Antibiotics Nausea And Vomiting   Tramadol Hcl Other (See Comments)   Feels odd   Venlafaxine Hcl Other (See Comments)   Feels odd   Amoxicillin-pot Clavulanate Nausea And Vomiting   Clarithromycin Rash    See ER note 02/2017      Medication List    TAKE these medications   acetaminophen 325 MG tablet Commonly known as: TYLENOL Take 650 mg by mouth every 6 (six) hours as needed.   amLODipine 5 MG tablet Commonly known as: NORVASC Take 1 tablet by mouth daily.   aspirin EC 81 MG tablet Take 81 mg by mouth every evening.   atorvastatin 80 MG tablet Commonly known as: Lipitor Take 1 tablet (80 mg total) by mouth daily. What changed: when to take this   clopidogrel 75 MG tablet Commonly known as: PLAVIX Take 1 tablet by mouth every evening.   glipiZIDE 5 MG 24 hr tablet Commonly known as: GLUCOTROL XL TAKE 1 TABLET BY MOUTH EVERYDAY WITH BREAKFAST   lansoprazole 30 MG capsule Commonly known as: PREVACID Take 1 capsule (30 mg total) by mouth daily at 12 noon.   lisinopril-hydrochlorothiazide 20-25 MG tablet Commonly known as: ZESTORETIC Take 1 tablet by mouth every morning.       Allergies  Allergen Reactions  . Citalopram Other (See Comments)    Feels odd  . Doxycycline Hyclate Nausea And Vomiting  . Erythromycin Other (See Comments)    Irritated stomach  . Montelukast Sodium Itching  . Nitrofurantoin Nausea And Vomiting  . Sulfa Antibiotics Nausea And Vomiting  . Tramadol Hcl Other (See Comments)    Feels odd  . Venlafaxine Hcl Other (See Comments)    Feels odd  . Amoxicillin-Pot Clavulanate Nausea And Vomiting  . Clarithromycin Rash     See ER note 02/2017    Consultations: Neuro, Dr. Loretha Brasil   Procedures/Studies: MR BRAIN WO CONTRAST  Result Date:  06/09/2019 CLINICAL DATA:  Stroke with right-sided weakness. Facial droop. Speech disturbance. EXAM: MRI HEAD WITHOUT CONTRAST TECHNIQUE: Multiplanar, multiecho pulse sequences of the brain and surrounding structures were obtained without intravenous contrast. COMPARISON:  Head CT same day.  MRI 08/04/2018. FINDINGS: Brain: Diffusion imaging shows 3 punctate foci of acute infarction in the left posterior frontal cortical brain consistent with micro embolic infarctions in the MCA territory. The brainstem and cerebellum are normal. There are old lacunar infarctions in the basal ganglia and radiating white matter tracts on both sides. There are moderate chronic small-vessel ischemic changes of the cerebral hemispheric white matter. There are old bilateral occipital cortical and subcortical infarctions. No mass, hemorrhage, hydrocephalus or extra-axial collection. Vascular: Major vessels at the base of the brain show flow. Skull and upper cervical spine: Negative Sinuses/Orbits: Clear/normal Other: None IMPRESSION: Three clustered punctate foci of acute infarction of the left posterior frontal lobe consistent with micro embolic infarctions in the MCA territory. No large or confluent infarction. No mass effect or hemorrhage. Chronic small-vessel ischemic changes of the cerebral hemispheric white matter and old  lacunar infarctions of the basal ganglia. Old bilateral occipital infarctions. Electronically Signed   By: Nelson Chimes M.D.   On: 06/09/2019 19:37   US Carotid Bilateral (at [facility] and [facility] only)  Result Date: 06/10/2019 CLINICAL DATA:  64 year old female with stroke-like symptoms EXAM: BILATERAL CAROTID DUPLEX ULTRASOUND TECHNIQUE: Pearline Cables scale imaging, color Doppler and duplex ultrasound were performed of bilateral carotid and vertebral arteries in the neck. COMPARISON:  Prior bilateral carotid duplex ultrasound 03/05/2018 FINDINGS: Criteria: Quantification of carotid stenosis is based on velocity parameters that  correlate the residual internal carotid diameter with NASCET-based stenosis levels, using the diameter of the distal internal carotid lumen as the denominator for stenosis measurement. The following velocity measurements were obtained: RIGHT ICA: 81/23 cm/sec CCA: 26/83 cm/sec SYSTOLIC ICA/CCA RATIO:  1.3 ECA:  102 cm/sec LEFT ICA: 91/30 cm/sec CCA: 41/96 cm/sec SYSTOLIC ICA/CCA RATIO:  1.1 ECA:  82 cm/sec RIGHT CAROTID ARTERY: Trace smooth heterogeneous atherosclerotic plaque in the proximal internal carotid artery. By peak systolic velocity criteria, the estimated stenosis remains less than 50%. RIGHT VERTEBRAL ARTERY:  Patent with antegrade flow. LEFT CAROTID ARTERY: Trace smooth heterogeneous atherosclerotic plaque in the proximal internal carotid artery. By peak systolic velocity criteria, the estimated stenosis remains less than 50%. LEFT VERTEBRAL ARTERY:  Patent with normal antegrade flow. IMPRESSION: 1. Mild (1-49%) stenosis proximal right internal carotid artery secondary to trace smooth heterogeneous atherosclerotic plaque. 2. Mild (1-49%) stenosis proximal left internal carotid artery secondary to trace smooth heterogeneous atherosclerotic plaque. 3. Vertebral arteries are patent with normal antegrade flow. Signed, Criselda Peaches, MD, [facility] Radiology Electronically Signed   By: Jacqulynn Cadet M.D.   On: 06/10/2019 09:39   ECHOCARDIOGRAM COMPLETE  Result Date: 06/10/2019    ECHOCARDIOGRAM REPORT   Patient Name:   LAQUETTA RACEY Date of Exam: 06/10/2019 Medical Rec #:  [id number]      Height:       64.0 in Accession #:    [id number]     Weight:       245.1 lb Date of Birth:  October 04, 1955      BSA:          2.133 m Patient Age:    30 years       BP:           170/77 mmHg Patient Gender: F              HR:           86 bpm. Exam Location:  [facility] Procedure: 2D Echo Indications:     Stroke 434.91/ I163.9  History:         Patient has prior  history of Echocardiogram examinations, most                  recent 08/04/2018.  Sonographer:     Arville Go RDCS Referring Phys:  Unknown Foley NIU Diagnosing Phys: Isaias Cowman MD  Sonographer Comments: Patient is morbidly obese, Technically difficult study due to poor echo windows, suboptimal subcostal window and Technically challenging study due to limited acoustic windows. Image acquisition challenging due to patient body habitus and Image acquisition challenging due to uncooperative patient. IMPRESSIONS  1. Left ventricular ejection fraction, by estimation, is 60 to 65%. The left ventricle has normal function. The left ventricle has no regional wall motion abnormalities. Left ventricular diastolic parameters are indeterminate.  2. Right ventricular systolic function is normal. The right ventricular size is normal.  3.  The mitral valve is normal in structure. Trivial mitral valve regurgitation. No evidence of mitral stenosis.  4. The aortic valve is normal in structure. Aortic valve regurgitation is not visualized. No aortic stenosis is present.  5. The inferior vena cava is normal in size with greater than 50% respiratory variability, suggesting right atrial pressure of 3 mmHg. FINDINGS  Left Ventricle: Left ventricular ejection fraction, by estimation, is 60 to 65%. The left ventricle has normal function. The left ventricle has no regional wall motion abnormalities. The left ventricular internal cavity size was normal in size. There is  no left ventricular hypertrophy. Left ventricular diastolic parameters are indeterminate. Right Ventricle: The right ventricular size is normal. No increase in right ventricular wall thickness. Right ventricular systolic function is normal. Left Atrium: Left atrial size was normal in size. Right Atrium: Right atrial size was normal in size. Pericardium: There is no evidence of pericardial effusion. Mitral Valve: The mitral valve is normal in structure. Normal mobility  of the mitral valve leaflets. Trivial mitral valve regurgitation. No evidence of mitral valve stenosis. Tricuspid Valve: The tricuspid valve is normal in structure. Tricuspid valve regurgitation is trivial. No evidence of tricuspid stenosis. Aortic Valve: The aortic valve is normal in structure. Aortic valve regurgitation is not visualized. No aortic stenosis is present. Aortic valve peak gradient measures 8.0 mmHg. Pulmonic Valve: The pulmonic valve was normal in structure. Pulmonic valve regurgitation is not visualized. No evidence of pulmonic stenosis. Aorta: The aortic root is normal in size and structure. Venous: The inferior vena cava is normal in size with greater than 50% respiratory variability, suggesting right atrial pressure of 3 mmHg. IAS/Shunts: No atrial level shunt detected by color flow Doppler.  LEFT VENTRICLE PLAX 2D LVIDd:         4.44 cm LVIDs:         3.05 cm LV PW:         1.10 cm LV IVS:        1.12 cm LVOT diam:     2.10 cm LVOT Area:     3.46 cm  LEFT ATRIUM         Index LA diam:    4.20 cm 1.97 cm/m  AORTIC VALVE              PULMONIC VALVE AV Vmax:      141.00 cm/s PV Vmax:       1.05 m/s AV Peak Grad: 8.0 mmHg    PV Peak grad:  4.4 mmHg  AORTA Ao Root diam: 2.80 cm Ao Asc diam:  3.30 cm  SHUNTS Systemic Diam: 2.10 cm Marcina Millard MD Electronically signed by Marcina Millard MD Signature Date/Time: 06/10/2019/12:46:33 PM    Final    CT HEAD CODE STROKE WO CONTRAST  Addendum Date: 06/09/2019   ADDENDUM REPORT: 06/09/2019 13:56 ADDENDUM: Study discussed by telephone with Dr. Chesley Noon on 06/09/2019 at 1350 hours. Electronically Signed   By: Odessa Fleming M.D.   On: 06/09/2019 13:56   Result Date: 06/09/2019 CLINICAL DATA:  Code stroke. 64 year old female with abnormal speech and right facial droop. Symptom onset 1230 hours. EXAM: CT HEAD WITHOUT CONTRAST TECHNIQUE: Contiguous axial images were obtained from the base of the skull through the vertex without intravenous  contrast. COMPARISON:  Head CT 03/14/2019. Brain MRI 08/04/2018. FINDINGS: Brain: Chronic bilateral PCA territory infarct and bilateral basal ganglia lacunar infarcts appear stable since January. Additional left corona radiata white matter hypodensity is stable. No superimposed acute cortically based  infarct identified. No midline shift, mass effect, or evidence of intracranial mass lesion. No acute intracranial hemorrhage identified. No ventriculomegaly. Vascular: Calcified atherosclerosis at the skull base. Stable vascular density, no suspicious vessel identified. Skull: Stable, negative. Sinuses/Orbits: Visualized paranasal sinuses and mastoids are stable and well pneumatized. Other: Visualized orbits and scalp soft tissues are within normal limits. ASPECTS [facility] Stroke Program Early CT Score) Total score (0-10 with 10 being normal): 10 (chronic encephalomalacia). IMPRESSION: 1. No acute cortically based infarct or acute intracranial hemorrhage identified. ASPECTS 10. 2. Chronic ischemic disease in the bilateral basal ganglia, left corona radiata, bilateral occipital poles. Electronically Signed: By: Odessa Fleming M.D. On: 06/09/2019 13:47       Subjective: Pt c/o weakness   Discharge Exam: Vitals:   06/10/19 0601 06/10/19 0800  BP: (!) 170/77 (!) 145/66  Pulse: 75 75  Resp: 16 16  Temp: 97.8 F (36.6 C) 97.8 F (36.6 C)  SpO2: 97% 99%   Vitals:   06/10/19 0208 06/10/19 0402 06/10/19 0601 06/10/19 0800  BP: 126/71 (!) 153/69 (!) 170/77 (!) 145/66  Pulse: 75 73 75 75  Resp: Temp: 97.9 F (36.6 C) (!) 97.5 F (36.4 C) 97.8 F (36.6 C) 97.8 F (36.6 C)  TempSrc: Oral Oral Oral   SpO2: 96% 95% 97% 99%  Weight:      Height:        General: Pt is alert, awake, not in acute distress Cardiovascular:S1/S2 +, no rubs, no gallops Respiratory: decreased breath sounds b/l otherwise clear, no wheezing, no rhonchi Abdominal: Soft, NT, obese, bowel sounds + Extremities:  no  cyanosis    The results of significant diagnostics from this hospitalization (including imaging, microbiology, ancillary and laboratory) are listed below for reference.     Microbiology: Recent Results (from the past 240 hour(s))  SARS CORONAVIRUS 2 (TAT 6-24 HRS) Nasopharyngeal Nasopharyngeal Swab     Status: None   Collection Time: 06/09/19  3:15 PM   Specimen: Nasopharyngeal Swab  Result Value Ref Range Status   SARS Coronavirus 2 NEGATIVE NEGATIVE Final    Comment: (NOTE) SARS-CoV-2 target nucleic acids are NOT DETECTED. The SARS-CoV-2 RNA is generally detectable in upper and lower respiratory specimens during the acute phase of infection. Negative results do not preclude SARS-CoV-2 infection, do not rule out co-infections with other pathogens, and should not be used as the sole basis for treatment or other patient management decisions. Negative results must be combined with clinical observations, patient history, and epidemiological information. The expected result is Negative. Fact Sheet for Patients: HairSlick.no Fact Sheet for Healthcare Providers: quierodirigir.com This test is not yet approved or cleared by the [place] FDA and  has been authorized for detection and/or diagnosis of SARS-CoV-2 by FDA under an Emergency Use Authorization (EUA). This EUA will remain  in effect (meaning this test can be used) for the duration of the COVID-19 declaration under Section 56 4(b)(1) of the Act, 21 U.S.C. section 360bbb-3(b)(1), unless the authorization is terminated or revoked sooner. Performed at [facility], [address], [place], [place] [address]      Labs: BNP (last 3 results) No results for input(s): BNP in the last 8760 hours. Basic Metabolic Panel: Recent Labs  Lab 06/09/19 1333  NA 135  K 3.9  CL 99  CO2 26  GLUCOSE 241*  BUN 18  CREATININE 1.24*  CALCIUM 9.2   Liver Function  Tests: Recent Labs  Lab 06/09/19 1333  AST  28  ALT 31  ALKPHOS 83  BILITOT 0.9  PROT 7.9  ALBUMIN 4.3   No results for input(s): LIPASE, AMYLASE in the last 168 hours. No results for input(s): AMMONIA in the last 168 hours. CBC: Recent Labs  Lab 06/09/19 1333  WBC 5.4  NEUTROABS 3.7  HGB 15.0  HCT 44.4  MCV 90.6  PLT 255   Cardiac Enzymes: No results for input(s): CKTOTAL, CKMB, CKMBINDEX, TROPONINI in the last 168 hours. BNP: Invalid input(s): POCBNP CBG: Recent Labs  Lab 06/09/19 1329 06/09/19 1831 06/09/19 2000 06/10/19 0800 06/10/19 1145  GLUCAP 235* 225* 193* 205* 203*   D-Dimer No results for input(s): DDIMER in the last 72 hours. Hgb A1c Recent Labs    06/10/19 0507  HGBA1C 8.3*   Lipid Profile Recent Labs    06/10/19 0507  CHOL 234*  HDL 31*  LDLCALC 170*  TRIG 163*  CHOLHDL 7.5   Thyroid function studies No results for input(s): TSH, T4TOTAL, T3FREE, THYROIDAB in the last 72 hours.  Invalid input(s): FREET3 Anemia work up No results for input(s): VITAMINB12, FOLATE, FERRITIN, TIBC, IRON, RETICCTPCT in the last 72 hours. Urinalysis    Component Value Date/Time   COLORURINE YELLOW (A) 08/03/2018 1927   APPEARANCEUR HAZY (A) 08/03/2018 1927   LABSPEC 1.014 08/03/2018 1927   PHURINE 6.0 08/03/2018 1927   GLUCOSEU >=500 (A) 08/03/2018 1927   HGBUR NEGATIVE 08/03/2018 1927   HGBUR negative 02/05/2010 1336   BILIRUBINUR Negative 01/15/2019 1508   KETONESUR NEGATIVE 08/03/2018 1927   PROTEINUR Negative 01/15/2019 1508   PROTEINUR NEGATIVE 08/03/2018 1927   UROBILINOGEN 0.2 01/15/2019 1508   UROBILINOGEN 0.2 02/05/2010 1336   NITRITE Negative 01/15/2019 1508   NITRITE NEGATIVE 08/03/2018 1927   LEUKOCYTESUR Negative 01/15/2019 1508   LEUKOCYTESUR TRACE (A) 08/03/2018 1927   Sepsis Labs Invalid input(s): PROCALCITONIN,  WBC,  LACTICIDVEN Microbiology Recent Results (from the past 240 hour(s))  SARS CORONAVIRUS 2 (TAT 6-24 HRS)  Nasopharyngeal Nasopharyngeal Swab     Status: None   Collection Time: 06/09/19  3:15 PM   Specimen: Nasopharyngeal Swab  Result Value Ref Range Status   SARS Coronavirus 2 NEGATIVE NEGATIVE Final    Comment: (NOTE) SARS-CoV-2 target nucleic acids are NOT DETECTED. The SARS-CoV-2 RNA is generally detectable in upper and lower respiratory specimens during the acute phase of infection. Negative results do not preclude SARS-CoV-2 infection, do not rule out co-infections with other pathogens, and should not be used as the sole basis for treatment or other patient management decisions. Negative results must be combined with clinical observations, patient history, and epidemiological information. The expected result is Negative. Fact Sheet for Patients: HairSlick.no Fact Sheet for Healthcare Providers: quierodirigir.com This test is not yet approved or cleared by the [place] FDA and  has been authorized for detection and/or diagnosis of SARS-CoV-2 by FDA under an Emergency Use Authorization (EUA). This EUA will remain  in effect (meaning this test can be used) for the duration of the COVID-19 declaration under Section 56 4(b)(1) of the Act, 21 U.S.C. section 360bbb-3(b)(1), unless the authorization is terminated or revoked sooner. Performed at [facility], [address] [place], [place] [address]      Time coordinating discharge: Over 30 minutes  SIGNED:   Charise Killian, MD  [facility] 06/10/2019, 2:55 PM Pager   If 7PM-7AM, please contact night-coverage [email or web address]

## 2019-06-10 NOTE — Progress Notes (Signed)
PROGRESS NOTE    Tami Jones  FGH:[id number] DOB: Jul 13, 1955 DOA: 06/09/2019 PCP: Venia Carbon, MD      Assessment & Plan:   Principal Problem:   Stroke [facility]) Active Problems:   GERD   Hypertension   HLD (hyperlipidemia)   Type II diabetes mellitus with renal manifestations ([place])   CKD (chronic kidney disease), stage IIIa    CVA:  history of stroke with right-sided weakness but developed slurred speech, right facial droop 06/09/19. CT head is negative for acute intracranial abnormalities. MRI brain shows left posterior frontal lobe consistent w/ embolic infarctions in MCA territory. No hx of a.fib/flutter. Continue on tele. Continue aspirin and plavix. Neuro consulted. Echo pending. Allow permissive HTN in the setting of acute stroke. PT/OT consulted   GERD: continue on protonix  Hypertension: will hold oral BP meds to allow permissive HTN in the setting of acute stroke. Hydralazine for SBP > 220, or DBP >120  HLD: continue on statin   DM2: HbA1c 7.7, poorly controled. Continue to hold glipizide. Continue on SSI w/ accuchecks   CKDIIIa: baseline creatinine is ~ 1.0. Cr is trending up slightly today. Continue on IVFs    DVT prophylaxis: lovenox Code Status: full  Family Communication:  Disposition Plan: depends on PT/OT recs   Consultants:   neuro   Procedures:   Antimicrobials:    Subjective: Pt c/o weakness   Objective: Vitals:   06/10/19 0003 06/10/19 0208 06/10/19 0402 06/10/19 0601  BP: (!) 106/56 126/71 (!) 153/69 (!) 170/77  Pulse: 80 75 73 75  Resp: 16 16 16 16   Temp: 98 F (36.7 C) 97.9 F (36.6 C) (!) 97.5 F (36.4 C) 97.8 F (36.6 C)  TempSrc: Oral Oral Oral Oral  SpO2: 94% 96% 95% 97%  Weight:      Height:        Intake/Output Summary (Last 24 hours) at 06/10/2019 0726 Last data filed at 06/10/2019 0300 Gross per 24 hour  Intake 588.86 ml  Output --  Net 588.86 ml   Filed Weights   06/09/19 1342  Weight: 111.2 kg     Examination:  General exam: Appears calm and comfortable  Respiratory system: Clear to auscultation. Respiratory effort normal. No rales Cardiovascular system: S1 & S2 +. No rubs, gallops or clicks.  Gastrointestinal system: Abdomen is nondistended, soft and nontender.  Normal bowel sounds heard. Central nervous system: Alert and oriented. Moves all 4 extremities  Psychiatry: Judgement and insight appear normal. Mood & affect appropriate.     Data Reviewed: I have personally reviewed following labs and imaging studies  CBC: Recent Labs  Lab 06/09/19 1333  WBC 5.4  NEUTROABS 3.7  HGB 15.0  HCT 44.4  MCV 90.6  PLT 678   Basic Metabolic Panel: Recent Labs  Lab 06/09/19 1333  NA 135  K 3.9  CL 99  CO2 26  GLUCOSE 241*  BUN 18  CREATININE 1.24*  CALCIUM 9.2   GFR: Estimated Creatinine Clearance: 57.4 mL/min (A) (by C-G formula based on SCr of 1.24 mg/dL (H)). Liver Function Tests: Recent Labs  Lab 06/09/19 1333  AST 28  ALT 31  ALKPHOS 83  BILITOT 0.9  PROT 7.9  ALBUMIN 4.3   No results for input(s): LIPASE, AMYLASE in the last 168 hours. No results for input(s): AMMONIA in the last 168 hours. Coagulation Profile: Recent Labs  Lab 06/09/19 1333  INR 0.9   Cardiac Enzymes: No results for input(s): CKTOTAL, CKMB, CKMBINDEX, TROPONINI in the  last 168 hours. BNP (last 3 results) No results for input(s): PROBNP in the last 8760 hours. HbA1C: No results for input(s): HGBA1C in the last 72 hours. CBG: Recent Labs  Lab 06/09/19 1329 06/09/19 1831 06/09/19 2000  GLUCAP 235* 225* 193*   Lipid Profile: Recent Labs    06/10/19 0507  CHOL 234*  HDL 31*  LDLCALC 170*  TRIG 163*  CHOLHDL 7.5   Thyroid Function Tests: No results for input(s): TSH, T4TOTAL, FREET4, T3FREE, THYROIDAB in the last 72 hours. Anemia Panel: No results for input(s): VITAMINB12, FOLATE, FERRITIN, TIBC, IRON, RETICCTPCT in the last 72 hours. Sepsis Labs: No results for  input(s): PROCALCITON, LATICACIDVEN in the last 168 hours.  Recent Results (from the past 240 hour(s))  SARS CORONAVIRUS 2 (TAT 6-24 HRS) Nasopharyngeal Nasopharyngeal Swab     Status: None   Collection Time: 06/09/19  3:15 PM   Specimen: Nasopharyngeal Swab  Result Value Ref Range Status   SARS Coronavirus 2 NEGATIVE NEGATIVE Final    Comment: (NOTE) SARS-CoV-2 target nucleic acids are NOT DETECTED. The SARS-CoV-2 RNA is generally detectable in upper and lower respiratory specimens during the acute phase of infection. Negative results do not preclude SARS-CoV-2 infection, do not rule out co-infections with other pathogens, and should not be used as the sole basis for treatment or other patient management decisions. Negative results must be combined with clinical observations, patient history, and epidemiological information. The expected result is Negative. Fact Sheet for Patients: HairSlick.no Fact Sheet for Healthcare Providers: quierodirigir.com This test is not yet approved or cleared by the [place] FDA and  has been authorized for detection and/or diagnosis of SARS-CoV-2 by FDA under an Emergency Use Authorization (EUA). This EUA will remain  in effect (meaning this test can be used) for the duration of the COVID-19 declaration under Section 56 4(b)(1) of the Act, 21 U.S.C. section 360bbb-3(b)(1), unless the authorization is terminated or revoked sooner. Performed at [facility], [address] [place], [place] [address]          Radiology Studies: MR BRAIN WO CONTRAST  Result Date: 06/09/2019 CLINICAL DATA:  Stroke with right-sided weakness. Facial droop. Speech disturbance. EXAM: MRI HEAD WITHOUT CONTRAST TECHNIQUE: Multiplanar, multiecho pulse sequences of the brain and surrounding structures were obtained without intravenous contrast. COMPARISON:  Head CT same day.  MRI 08/04/2018. FINDINGS: Brain:  Diffusion imaging shows 3 punctate foci of acute infarction in the left posterior frontal cortical brain consistent with micro embolic infarctions in the MCA territory. The brainstem and cerebellum are normal. There are old lacunar infarctions in the basal ganglia and radiating white matter tracts on both sides. There are moderate chronic small-vessel ischemic changes of the cerebral hemispheric white matter. There are old bilateral occipital cortical and subcortical infarctions. No mass, hemorrhage, hydrocephalus or extra-axial collection. Vascular: Major vessels at the base of the brain show flow. Skull and upper cervical spine: Negative Sinuses/Orbits: Clear/normal Other: None IMPRESSION: Three clustered punctate foci of acute infarction of the left posterior frontal lobe consistent with micro embolic infarctions in the MCA territory. No large or confluent infarction. No mass effect or hemorrhage. Chronic small-vessel ischemic changes of the cerebral hemispheric white matter and old lacunar infarctions of the basal ganglia. Old bilateral occipital infarctions. Electronically Signed   By: Paulina Fusi M.D.   On: 06/09/2019 19:37   CT HEAD CODE STROKE WO CONTRAST  Addendum Date: 06/09/2019   ADDENDUM REPORT: 06/09/2019 13:56 ADDENDUM: Study discussed by telephone with Dr.  CHARLES JESSUP on 06/09/2019 at 1350 hours. Electronically Signed   By: Odessa Fleming M.D.   On: 06/09/2019 13:56   Result Date: 06/09/2019 CLINICAL DATA:  Code stroke. 64 year old female with abnormal speech and right facial droop. Symptom onset 1230 hours. EXAM: CT HEAD WITHOUT CONTRAST TECHNIQUE: Contiguous axial images were obtained from the base of the skull through the vertex without intravenous contrast. COMPARISON:  Head CT 03/14/2019. Brain MRI 08/04/2018. FINDINGS: Brain: Chronic bilateral PCA territory infarct and bilateral basal ganglia lacunar infarcts appear stable since January. Additional left corona radiata white matter hypodensity  is stable. No superimposed acute cortically based infarct identified. No midline shift, mass effect, or evidence of intracranial mass lesion. No acute intracranial hemorrhage identified. No ventriculomegaly. Vascular: Calcified atherosclerosis at the skull base. Stable vascular density, no suspicious vessel identified. Skull: Stable, negative. Sinuses/Orbits: Visualized paranasal sinuses and mastoids are stable and well pneumatized. Other: Visualized orbits and scalp soft tissues are within normal limits. ASPECTS [facility] Stroke Program Early CT Score) Total score (0-10 with 10 being normal): 10 (chronic encephalomalacia). IMPRESSION: 1. No acute cortically based infarct or acute intracranial hemorrhage identified. ASPECTS 10. 2. Chronic ischemic disease in the bilateral basal ganglia, left corona radiata, bilateral occipital poles. Electronically Signed: By: Odessa Fleming M.D. On: 06/09/2019 13:47        Scheduled Meds: . aspirin EC  81 mg Oral QPM  . atorvastatin  80 mg Oral QPM  . clopidogrel  75 mg Oral QPM  . enoxaparin (LOVENOX) injection  40 mg Subcutaneous Q24H  . insulin aspart  0-5 Units Subcutaneous QHS  . insulin aspart  0-9 Units Subcutaneous TID WC  . pantoprazole  20 mg Oral Daily   Continuous Infusions: . sodium chloride 75 mL/hr at 06/10/19 0300     LOS: 0 days    Time spent: 35 mins    Charise Killian, MD [facility] Pager [phone]  If 7PM-7AM, please contact night-coverage [email or web address] 06/10/2019, 7:26 AM

## 2019-06-10 NOTE — Evaluation (Signed)
Physical Therapy Evaluation Patient Details Name: JAHARA DAIL MRN: [id number] DOB: November 06, 1955 Today's Date: 06/10/2019   History of Present Illness  Starr Urias Lamons is a 64 y.o. female with medical history significant of hypertension, hyperlipidemia, diabetes mellitus, asthma, stroke with right-sided weakness, GERD, depression, anxiety, CKD stage III, who presents with slurred speech, right facial droop. MRI: Three clustered punctate foci of acute infarction of the left posterior frontal lobe consistent with micro embolic infarctions in the MCA territory.   Clinical Impression  Pt was seen for mobility and strength testing after onset of greater weakness on LE's but also stiffness of R ankle, moderate incoordination of RLE.  Her plan is to work on mobility with PT acutely to make progress on distances with better balance, strength of LE"s and to work on tolerance for lesser AD than RW.  Her family who are with her were in attendance with eval and were able to observe her functionally, aware of her decline from previous level at home.    Follow Up Recommendations Home health PT;Supervision for mobility/OOB    Equipment Recommendations  Rolling walker with 5" wheels(if pt does not have good option at home)    Recommendations for Other Services       Precautions / Restrictions Precautions Precautions: Fall Precaution Comments: monitor pulses, sats Restrictions Weight Bearing Restrictions: No      Mobility  Bed Mobility Overal bed mobility: Modified Independent             General bed mobility comments: on side of bed when PT arrived but returned to bed mod I  Transfers Overall transfer level: Needs assistance Equipment used: Rolling walker (2 wheeled) Transfers: Sit to/from Stand Sit to Stand: Min guard(for minimal contact to steady)         General transfer comment: RW due to standing stability dynamically  Ambulation/Gait Ambulation/Gait assistance: Min guard Gait  Distance (Feet): 60 Feet Assistive device: Rolling walker (2 wheeled);1 person hand held assist Gait Pattern/deviations: Step-through pattern;Wide base of support;Trunk flexed;Decreased weight shift to right Gait velocity: reduced Gait velocity interpretation: <1.31 ft/sec, indicative of household ambulator General Gait Details: pt is laterally unstable and using walker to control balance, to manage LE weakness  Stairs            Wheelchair Mobility    Modified Rankin (Stroke Patients Only)       Balance Overall balance assessment: Needs assistance Sitting-balance support: Feet supported Sitting balance-Leahy Scale: Good     Standing balance support: Bilateral upper extremity supported;During functional activity Standing balance-Leahy Scale: Fair Standing balance comment: needs min guard and min cues for management of dynamic standing balance                             Pertinent Vitals/Pain Pain Assessment: No/denies pain    Home Living Family/patient expects to be discharged to:: Private residence Living Arrangements: Children(and grandchildren) Available Help at Discharge: Family;Available 24 hours/day Type of Home: House Home Access: Stairs to enter Entrance Stairs-Rails: Left Entrance Stairs-Number of Steps: 3 Home Layout: One level Home Equipment: Walker - 2 wheels;Cane - quad;Bedside commode;Grab bars - tub/shower;Shower seat      Prior Function                 Hand Dominance   Dominant Hand: Right    Extremity/Trunk Assessment   Upper Extremity Assessment Upper Extremity Assessment: Defer to OT evaluation    Lower Extremity Assessment  Lower Extremity Assessment: Generalized weakness(R LE 3 to 4-. LLE 4- to 4+)    Cervical / Trunk Assessment Cervical / Trunk Assessment: Normal  Communication   Communication: No difficulties  Cognition Arousal/Alertness: Awake/alert Behavior During Therapy: WFL for tasks  assessed/performed Overall Cognitive Status: Within Functional Limits for tasks assessed                                        General Comments General comments (skin integrity, edema, etc.): Daughter is with pt at home, able to walk on hall with PT and pt to observe and discuss her functional appearance.    Exercises     Assessment/Plan    PT Assessment Patient needs continued PT services  PT Problem List Decreased strength;Decreased range of motion;Decreased activity tolerance;Decreased balance;Decreased mobility;Decreased coordination;Decreased knowledge of use of DME;Decreased safety awareness;Cardiopulmonary status limiting activity;Obesity       PT Treatment Interventions DME instruction;Gait training;Stair training;Functional mobility training;Therapeutic activities;Therapeutic exercise;Balance training;Neuromuscular re-education;Patient/family education    PT Goals (Current goals can be found in the Care Plan section)  Acute Rehab PT Goals Patient Stated Goal: to return home PT Goal Formulation: With patient/family Time For Goal Achievement: 06/24/19 Potential to Achieve Goals: Good    Frequency 7X/week   Barriers to discharge Inaccessible home environment stairs to enter house    Co-evaluation               AM-PAC PT "6 Clicks" Mobility  Outcome Measure Help needed turning from your back to your side while in a flat bed without using bedrails?: None Help needed moving from lying on your back to sitting on the side of a flat bed without using bedrails?: None Help needed moving to and from a bed to a chair (including a wheelchair)?: None Help needed standing up from a chair using your arms (e.g., wheelchair or bedside chair)?: A Little Help needed to walk in hospital room?: A Little Help needed climbing 3-5 steps with a railing? : Total 6 Click Score: 19    End of Session Equipment Utilized During Treatment: Gait belt Activity Tolerance:  Patient limited by fatigue;Treatment limited secondary to medical complications (Comment) Patient left: in bed;with call bell/phone within reach;with family/visitor present;with nursing/sitter in room Nurse Communication: Mobility status PT Visit Diagnosis: Unsteadiness on feet (R26.81);Muscle weakness (generalized) (M62.81);Ataxic gait (R26.0);Hemiplegia and hemiparesis Hemiplegia - Right/Left: Right Hemiplegia - dominant/non-dominant: Dominant Hemiplegia - caused by: Cerebral infarction    Time: 1030-1102 PT Time Calculation (min) (ACUTE ONLY): 32 min   Charges:   PT Evaluation $PT Eval Moderate Complexity: 1 Mod PT Treatments $Gait Training: 8-22 mins       Ivar Drape 06/10/2019, 1:35 PM  Samul Dada, PT MS Acute Rehab Dept. Number: [facility] [id number] and [facility] [phone]

## 2019-06-10 NOTE — Evaluation (Signed)
Occupational Therapy Evaluation Patient Details Name: Tami Jones MRN: [id number] DOB: 1955-06-18 Today's Date: 06/10/2019    History of Present Illness Tami Jones is a 64 y.o. female with medical history significant of hypertension, hyperlipidemia, diabetes mellitus, asthma, stroke with right-sided weakness, GERD, depression, anxiety, CKD stage III, who presents with slurred speech, right facial droop. MRI: Three clustered punctate foci of acute infarction of the left posterior frontal lobe consistent with micro embolic infarctions in the MCA territory.    Clinical Impression   Tami Jones seen for OT evaluation this date. Prior to hospital admission, pt was MOD I for mobility using quad cane and required assist for LB access and family assisted c IADLs. Pt lives with daugther, SIL, and 2 grandkids in a one-level home with 3 steps to enter and L hand rail. Pt presents to acute OT demonstrating impaired ADL performance and functional mobility 2/2 decreased fine motor coordination of dominant hand, functional strength/ROM/endurance deficits, and pain. Currently pt demonstrates significant impairments in RUE strength, coordination, and sensation. Pt is R hand dominant and requires SBA + RW for standing grooming ADL tasks and MOD A for LB access. Pt currently requires CGA + RW toileting at standard commode including perihygiene and brief management in standing. Pt would benefit from skilled OT to address noted impairments and functional limitations (see below for any additional details) in order to maximize safety and independence while minimizing falls risk and caregiver burden. Upon hospital discharge, recommend HHOT to maximize pt safety and return to functional independence during meaningful occupations of daily life.     Follow Up Recommendations  Home health OT;Supervision/Assistance - 24 hour    Equipment Recommendations  None recommended by OT    Recommendations for Other Services        Precautions / Restrictions Precautions Precautions: Fall Restrictions Weight Bearing Restrictions: No      Mobility Bed Mobility Overal bed mobility: Modified Independent             General bed mobility comments: HOB elevated ~30* sup<>sit   Transfers Overall transfer level: Needs assistance Equipment used: Rolling walker (2 wheeled);Quad cane Transfers: Sit to/from Stand Sit to Stand: Min guard         General transfer comment: CGA + quad cane sit<>stand at EOB c mild balance deficits noted. SBA + RW sit<>stand at EOB and standard commode.     Balance Overall balance assessment: Needs assistance Sitting-balance support: No upper extremity supported;Feet supported Sitting balance-Leahy Scale: Good     Standing balance support: Single extremity supported;During functional activity Standing balance-Leahy Scale: Fair Standing balance comment: Required singlue UE support and trunk against counter during functional activity reaching inside BOS                           ADL either performed or assessed with clinical judgement   ADL Overall ADL's : Needs assistance/impaired                                       General ADL Comments: SUP + single UE support on counter tooth brushing and face washing standing sink side. SETUP + Increased time self feeding seated in bed. SUPERVISION + RW toileting at commode c CGA for perihygiene in standing. CGA + RW hand washing standing sink side.      Vision  Perception     Praxis      Pertinent Vitals/Pain Pain Assessment: Faces Faces Pain Scale: Hurts even more Pain Location: R shoulder/deltoid/tricep Pain Descriptors / Indicators: Aching;Burning;Jabbing Pain Intervention(s): Limited activity within patient's tolerance;Repositioned     Hand Dominance Right   Extremity/Trunk Assessment Upper Extremity Assessment Upper Extremity Assessment: RUE deficits/detail RUE Deficits / Details:  R grip 3/5. Shoulder flexion AROM ~80*, PROM ~100*. Elbow flexion WFL. 5 finger opposition intact c increased time. Pt reports burning/ aching sensation in proximal RUE muscles.  RUE Coordination: decreased fine motor   Lower Extremity Assessment Lower Extremity Assessment: Overall WFL for tasks assessed       Communication Communication Communication: No difficulties   Cognition Arousal/Alertness: Awake/alert Behavior During Therapy: WFL for tasks assessed/performed Overall Cognitive Status: Within Functional Limits for tasks assessed                                     General Comments       Exercises Exercises: Other exercises Other Exercises Other Exercises: Pt and caregiver educated re: falls prevetnion, DME recommendations, d/c recommendations, neuro re-ed, and home/routines modifications Other Exercises: Self-feeding, toileting, tooth brushing, face washing, hand washing, bed mobility, sup<>sit, sit<>stand x3, in room mobility ~30 feet, sitting/standing balance/tolerance   Shoulder Instructions      Home Living Family/patient expects to be discharged to:: Private residence Living Arrangements: Children(DTR, SIL, 2 grand kids (80 yo and 81 yo)) Available Help at Discharge: Family;Available 24 hours/day Type of Home: House Home Access: Stairs to enter [employer] of Steps: 3 Entrance Stairs-Rails: Left Home Layout: One level     Bathroom Shower/Tub: Chief Strategy Officer: Standard     Home Equipment: Environmental consultant - 2 wheels;Cane - quad;Bedside commode;Grab bars - tub/shower          Prior Functioning/Environment Level of Independence: Needs assistance        Comments: MOD I c quad cane. Assist from DTR for LB access (socks, toe nail clipping). Assist from family for IADLs        OT Problem List: Decreased strength;Decreased range of motion;Impaired balance (sitting and/or standing);Decreased coordination      OT  Treatment/Interventions: Self-care/ADL training;Therapeutic exercise;Neuromuscular education;Energy conservation;DME and/or AE instruction;Therapeutic activities;Patient/family education;Balance training    OT Goals(Current goals can be found in the care plan section) Acute Rehab OT Goals Patient Stated Goal: to return home OT Goal Formulation: With patient/family Time For Goal Achievement: 06/24/19 Potential to Achieve Goals: Good ADL Goals Pt Will Perform Eating: with set-up;sitting Pt Will Perform Grooming: with modified independence;standing(c LRAD PRN) Pt Will Transfer to Toilet: with modified independence;ambulating;regular height toilet(c LRAD PRN) Pt Will Perform Toileting - Clothing Manipulation and hygiene: with modified independence;sit to/from stand(c LRAD PRN)  OT Frequency: Min 2X/week   Barriers to D/C: Inaccessible home environment          Co-evaluation              AM-PAC OT "6 Clicks" Daily Activity     Outcome Measure Help from another person eating meals?: A Little Help from another person taking care of personal grooming?: A Little Help from another person toileting, which includes using toliet, bedpan, or urinal?: A Little Help from another person bathing (including washing, rinsing, drying)?: A Lot Help from another person to put on and taking off regular upper body clothing?: A Little Help from another person to put  on and taking off regular lower body clothing?: A Lot 6 Click Score: 16   End of Session Equipment Utilized During Treatment: Engineer, water Communication: Mobility status  Activity Tolerance: Patient tolerated treatment well Patient left: in bed;with call bell/phone within reach;with family/visitor present  OT Visit Diagnosis: Unsteadiness on feet (R26.81);Hemiplegia and hemiparesis Hemiplegia - Right/Left: Right Hemiplegia - dominant/non-dominant: Dominant Hemiplegia - caused by: Cerebral infarction                Time:  7505-1833 OT Time Calculation (min): 36 min Charges:  OT General Charges $OT Visit: 1 Visit OT Evaluation $OT Eval Moderate Complexity: 1 Mod OT Treatments $Self Care/Home Management : 23-37 mins  Kathie Dike, M.S. OTR/L  06/10/19, 9:53 AM

## 2019-06-10 NOTE — Progress Notes (Signed)
*  PRELIMINARY RESULTS* Echocardiogram 2D Echocardiogram has been performed.  Garrel Ridgel Makani Seckman 06/10/2019, 10:35 AM

## 2019-06-10 NOTE — Consult Note (Signed)
Reason for Consult: dysarthria and R side weaknes  Requesting Physician: Dr. Clyde Lundborg  CC: R side weakness   HPI: Tami Jones is an 64 y.o. femalewith medical history significant of hypertension, hyperlipidemia, diabetes mellitus, asthma, stroke with right-sided weakness, GERD, depression, anxiety, CKD stage III, who presents with slurred speech, right facial droop that has improved and nearly resolved.  On admission BP of 205/109. HbA1c is 8.3.  Patient is compliant with her medications and is on ASA plus Plavix at home.   Past Medical History:  Diagnosis Date  . Anxiety   . Asthma   . 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 Thad Ranger  . Diabetes mellitus   . GERD (gastroesophageal reflux disease)   . Hyperlipidemia   . Hypertension     Past Surgical History:  Procedure Laterality Date  . CARDIOVASCULAR STRESS TEST  1/15   myoview. EF 60%  . CESAREAN SECTION    . ESOPHAGOGASTRODUODENOSCOPY  05/2005  . TONSILLECTOMY AND ADENOIDECTOMY      Family History  Problem Relation Age of Onset  . Coronary artery disease Mother   . Diabetes Mellitus II Mother   . Heart attack Mother 6  . Diabetes Mellitus II Maternal Grandmother     Social History:  reports that she has never smoked. She has never used smokeless tobacco. She reports that she does not drink alcohol or use drugs.  Allergies  Allergen Reactions  . Citalopram Other (See Comments)    Feels odd  . Doxycycline Hyclate Nausea And Vomiting  . Erythromycin Other (See Comments)    Irritated stomach  . Montelukast Sodium Itching  . Nitrofurantoin Nausea And Vomiting  . Sulfa Antibiotics Nausea And Vomiting  . Tramadol Hcl Other (See Comments)    Feels odd  . Venlafaxine Hcl Other (See Comments)    Feels odd  . Amoxicillin-Pot Clavulanate Nausea And Vomiting  . Clarithromycin Rash     See ER note 02/2017    Medications: I have reviewed the patient's current medications.  ROS: History  obtained from the patient  General ROS: negative for - chills, fatigue, fever, night sweats, weight gain or weight loss Psychological ROS: negative for - behavioral disorder, hallucinations, memory difficulties, mood swings or suicidal ideation Ophthalmic ROS: negative for - blurry vision, double vision, eye pain or loss of vision ENT ROS: negative for - epistaxis, nasal discharge, oral lesions, sore throat, tinnitus or vertigo Allergy and Immunology ROS: negative for - hives or itchy/watery eyes Hematological and Lymphatic ROS: negative for - bleeding problems, bruising or swollen lymph nodes Endocrine ROS: negative for - galactorrhea, hair pattern changes, polydipsia/polyuria or temperature intolerance Respiratory ROS: negative for - cough, hemoptysis, shortness of breath or wheezing Cardiovascular ROS: negative for - chest pain, dyspnea on exertion, edema or irregular heartbeat Gastrointestinal ROS: negative for - abdominal pain, diarrhea, hematemesis, nausea/vomiting or stool incontinence Genito-Urinary ROS: negative for - dysuria, hematuria, incontinence or urinary frequency/urgency Musculoskeletal ROS: negative for - joint swelling or muscular weakness Neurological ROS: as noted in HPI Dermatological ROS: negative for rash and skin lesion changes  Physical Examination: Blood pressure (!) 145/66, pulse 75, temperature 97.8 F (36.6 C), resp. rate 16, height 5\' 4"  (1.626 m), weight 111.2 kg, SpO2 99 %.   Neurological Examination   Mental Status: Alert, oriented, thought content appropriate.  Speech fluent without evidence of aphasia.  Able to follow 3 step commands without difficulty. Cranial Nerves: II: Discs flat bilaterally; Visual fields grossly normal, pupils  equal, round, reactive to light and accommodation III,IV, VI: ptosis not present, extra-ocular motions intact bilaterally V,VII: smile symmetric, facial light touch sensation normal bilaterally VIII: hearing normal  bilaterally XI: bilateral shoulder shrug XII: midline tongue extension Motor: Right : Upper extremity   5/5    Left:     Upper extremity   5/5  Lower extremity   5/5     Lower extremity   5/5 Tone and bulk:normal tone throughout; no atrophy noted Sensory: Pinprick and light touch intact throughout, bilaterally Deep Tendon Reflexes: 21 and symmetric throughout Plantars: Right: downgoing   Left: downgoing Cerebellar: normal finger-to-nose, normal rapid alternating movements and normal heel-to-shin test Gait: not tested      Laboratory Studies:   Basic Metabolic Panel: Recent Labs  Lab 06/09/19 1333  NA 135  K 3.9  CL 99  CO2 26  GLUCOSE 241*  BUN 18  CREATININE 1.24*  CALCIUM 9.2    Liver Function Tests: Recent Labs  Lab 06/09/19 1333  AST 28  ALT 31  ALKPHOS 83  BILITOT 0.9  PROT 7.9  ALBUMIN 4.3   No results for input(s): LIPASE, AMYLASE in the last 168 hours. No results for input(s): AMMONIA in the last 168 hours.  CBC: Recent Labs  Lab 06/09/19 1333  WBC 5.4  NEUTROABS 3.7  HGB 15.0  HCT 44.4  MCV 90.6  PLT 255    Cardiac Enzymes: No results for input(s): CKTOTAL, CKMB, CKMBINDEX, TROPONINI in the last 168 hours.  BNP: Invalid input(s): POCBNP  CBG: Recent Labs  Lab 06/09/19 1329 06/09/19 1831 06/09/19 2000 06/10/19 0800  GLUCAP 235* 225* 193* 205*    Microbiology: Results for orders placed or performed during the hospital encounter of 06/09/19  SARS CORONAVIRUS 2 (TAT 6-24 HRS) Nasopharyngeal Nasopharyngeal Swab     Status: None   Collection Time: 06/09/19  3:15 PM   Specimen: Nasopharyngeal Swab  Result Value Ref Range Status   SARS Coronavirus 2 NEGATIVE NEGATIVE Final    Comment: (NOTE) SARS-CoV-2 target nucleic acids are NOT DETECTED. The SARS-CoV-2 RNA is generally detectable in upper and lower respiratory specimens during the acute phase of infection. Negative results do not preclude SARS-CoV-2 infection, do not rule  out co-infections with other pathogens, and should not be used as the sole basis for treatment or other patient management decisions. Negative results must be combined with clinical observations, patient history, and epidemiological information. The expected result is Negative. Fact Sheet for Patients: HairSlick.no Fact Sheet for Healthcare Providers: quierodirigir.com This test is not yet approved or cleared by the [place] FDA and  has been authorized for detection and/or diagnosis of SARS-CoV-2 by FDA under an Emergency Use Authorization (EUA). This EUA will remain  in effect (meaning this test can be used) for the duration of the COVID-19 declaration under Section 56 4(b)(1) of the Act, 21 U.S.C. section 360bbb-3(b)(1), unless the authorization is terminated or revoked sooner. Performed at [facility], [address] [place], [place] [address]     Coagulation Studies: Recent Labs    06/09/19 1333  LABPROT 11.9  INR 0.9    Urinalysis: No results for input(s): COLORURINE, LABSPEC, PHURINE, GLUCOSEU, HGBUR, BILIRUBINUR, KETONESUR, PROTEINUR, UROBILINOGEN, NITRITE, LEUKOCYTESUR in the last 168 hours.  Invalid input(s): APPERANCEUR  Lipid Panel:     Component Value Date/Time   CHOL 234 (H) 06/10/2019 0507   CHOL 238 (H) 02/17/2017 1300   TRIG 163 (H) 06/10/2019 0507   HDL 31 (L) 06/10/2019  0507   HDL 39 (L) 02/17/2017 1300   CHOLHDL 7.5 06/10/2019 0507   VLDL 33 06/10/2019 0507   LDLCALC 170 (H) 06/10/2019 0507   LDLCALC 164 (H) 02/17/2017 1300    HgbA1C:  Lab Results  Component Value Date   HGBA1C 8.3 (H) 06/10/2019    Urine Drug Screen:      Component Value Date/Time   LABOPIA NONE DETECTED 06/09/2019 1515   COCAINSCRNUR NONE DETECTED 06/09/2019 1515   LABBENZ NONE DETECTED 06/09/2019 1515   AMPHETMU NONE DETECTED 06/09/2019 1515   THCU NONE DETECTED 06/09/2019 1515   LABBARB NONE DETECTED  06/09/2019 1515    Alcohol Level: No results for input(s): ETH in the last 168 hours.  Other results: EKG: normal EKG, normal sinus rhythm, unchanged from previous tracings.  Imaging: MR BRAIN WO CONTRAST  Result Date: 06/09/2019 CLINICAL DATA:  Stroke with right-sided weakness. Facial droop. Speech disturbance. EXAM: MRI HEAD WITHOUT CONTRAST TECHNIQUE: Multiplanar, multiecho pulse sequences of the brain and surrounding structures were obtained without intravenous contrast. COMPARISON:  Head CT same day.  MRI 08/04/2018. FINDINGS: Brain: Diffusion imaging shows 3 punctate foci of acute infarction in the left posterior frontal cortical brain consistent with micro embolic infarctions in the MCA territory. The brainstem and cerebellum are normal. There are old lacunar infarctions in the basal ganglia and radiating white matter tracts on both sides. There are moderate chronic small-vessel ischemic changes of the cerebral hemispheric white matter. There are old bilateral occipital cortical and subcortical infarctions. No mass, hemorrhage, hydrocephalus or extra-axial collection. Vascular: Major vessels at the base of the brain show flow. Skull and upper cervical spine: Negative Sinuses/Orbits: Clear/normal Other: None IMPRESSION: Three clustered punctate foci of acute infarction of the left posterior frontal lobe consistent with micro embolic infarctions in the MCA territory. No large or confluent infarction. No mass effect or hemorrhage. Chronic small-vessel ischemic changes of the cerebral hemispheric white matter and old lacunar infarctions of the basal ganglia. Old bilateral occipital infarctions. Electronically Signed   By: Paulina Fusi M.D.   On: 06/09/2019 19:37   US Carotid Bilateral (at [facility] and [facility] only)  Result Date: 06/10/2019 CLINICAL DATA:  64 year old female with stroke-like symptoms EXAM: BILATERAL CAROTID DUPLEX ULTRASOUND TECHNIQUE: [name] Cullens scale imaging, color Doppler and duplex ultrasound  were performed of bilateral carotid and vertebral arteries in the neck. COMPARISON:  Prior bilateral carotid duplex ultrasound 03/05/2018 FINDINGS: Criteria: Quantification of carotid stenosis is based on velocity parameters that correlate the residual internal carotid diameter with NASCET-based stenosis levels, using the diameter of the distal internal carotid lumen as the denominator for stenosis measurement. The following velocity measurements were obtained: RIGHT ICA: 81/23 cm/sec CCA: 63/19 cm/sec SYSTOLIC ICA/CCA RATIO:  1.3 ECA:  102 cm/sec LEFT ICA: 91/30 cm/sec CCA: 85/28 cm/sec SYSTOLIC ICA/CCA RATIO:  1.1 ECA:  82 cm/sec RIGHT CAROTID ARTERY: Trace smooth heterogeneous atherosclerotic plaque in the proximal internal carotid artery. By peak systolic velocity criteria, the estimated stenosis remains less than 50%. RIGHT VERTEBRAL ARTERY:  Patent with antegrade flow. LEFT CAROTID ARTERY: Trace smooth heterogeneous atherosclerotic plaque in the proximal internal carotid artery. By peak systolic velocity criteria, the estimated stenosis remains less than 50%. LEFT VERTEBRAL ARTERY:  Patent with normal antegrade flow. IMPRESSION: 1. Mild (1-49%) stenosis proximal right internal carotid artery secondary to trace smooth heterogeneous atherosclerotic plaque. 2. Mild (1-49%) stenosis proximal left internal carotid artery secondary to trace smooth heterogeneous atherosclerotic plaque. 3. Vertebral arteries are patent with normal antegrade flow. Signed, Vilma Prader  K. Laurence Ferrari, MD, [facility] Electronically Signed   By: Jacqulynn Cadet M.D.   On: 06/10/2019 09:39   CT HEAD CODE STROKE WO CONTRAST  Addendum Date: 06/09/2019   ADDENDUM REPORT: 06/09/2019 13:56 ADDENDUM: Study discussed by telephone with Dr. Blake Divine on 06/09/2019 at 1350 hours. Electronically Signed   By: Genevie Ann M.D.   On: 06/09/2019 13:56   Result Date: 06/09/2019 CLINICAL  DATA:  Code stroke. 64 year old female with abnormal speech and right facial droop. Symptom onset 1230 hours. EXAM: CT HEAD WITHOUT CONTRAST TECHNIQUE: Contiguous axial images were obtained from the base of the skull through the vertex without intravenous contrast. COMPARISON:  Head CT 03/14/2019. Brain MRI 08/04/2018. FINDINGS: Brain: Chronic bilateral PCA territory infarct and bilateral basal ganglia lacunar infarcts appear stable since January. Additional left corona radiata white matter hypodensity is stable. No superimposed acute cortically based infarct identified. No midline shift, mass effect, or evidence of intracranial mass lesion. No acute intracranial hemorrhage identified. No ventriculomegaly. Vascular: Calcified atherosclerosis at the skull base. Stable vascular density, no suspicious vessel identified. Skull: Stable, negative. Sinuses/Orbits: Visualized paranasal sinuses and mastoids are stable and well pneumatized. Other: Visualized orbits and scalp soft tissues are within normal limits. ASPECTS [facility] Stroke Program Early CT Score) Total score (0-10 with 10 being normal): 10 (chronic encephalomalacia). IMPRESSION: 1. No acute cortically based infarct or acute intracranial hemorrhage identified. ASPECTS 10. 2. Chronic ischemic disease in the bilateral basal ganglia, left corona radiata, bilateral occipital poles. Electronically Signed: By: Genevie Ann M.D. On: 06/09/2019 13:47     Assessment/Plan:  64 y.o. femalewith medical history significant of hypertension, hyperlipidemia, diabetes mellitus, asthma, stroke with right-sided weakness, GERD, depression, anxiety, CKD stage III, who presents with slurred speech, right facial droop that has improved and nearly resolved.  On admission BP of 205/109. HbA1c is 8.3.  Patient is compliant with her medications and is on ASA plus Plavix at home.   - Patient found to have L MCA small infarcts.   - Stroke is in setting of small vessel disease in setting  of DM, and uncontrolled BP - d/w daughter regarding BP management  - con't ASA and Plavix at home - she will likely need some home PT - US carotid no significant intracranial stenosis - D/c planning from Neurological stand point today - d/w family at bedside.  06/10/2019, 11:46 AM

## 2019-06-11 ENCOUNTER — Telehealth: Payer: Self-pay

## 2019-06-11 NOTE — Telephone Encounter (Signed)
Attempted to call patient, but patient didn't answer and her voice mail is full.

## 2019-06-11 NOTE — Telephone Encounter (Signed)
Dereck Ligas, LPN  Sherrie George, RN  This did not populate on my TCM list. Needs regular follow up visit her [place].      Pt needs a hospital f/u.

## 2019-06-14 ENCOUNTER — Telehealth: Payer: Self-pay

## 2019-06-14 NOTE — Telephone Encounter (Signed)
Pt and daughter spoke w/[facility] yesterday.  They are refusing PT.  They stated to [facility] yesterday any sxs from this hospitalization have resolved and any residual is from last stroke.  Spoke w/Stephanie from [facility], [phone].  She is aware pt doesn't want to do PT.

## 2019-06-14 NOTE — Telephone Encounter (Signed)
Okay If she is doing well, we can cancel the PT

## 2019-06-16 ENCOUNTER — Ambulatory Visit: Payer: Self-pay

## 2019-06-19 ENCOUNTER — Ambulatory Visit: Payer: 59 | Attending: Internal Medicine

## 2019-06-19 DIAGNOSIS — Z23 Encounter for immunization: Secondary | ICD-10-CM

## 2019-06-19 NOTE — Progress Notes (Signed)
   Covid-19 Vaccination Clinic  Name:  Tami Jones    MRN: [id number] DOB: 1956-02-02  06/19/2019  [name] was observed post Covid-19 immunization for 30 minutes based on pre-vaccination screening without incident. She was provided with Vaccine Information Sheet and instruction to access the V-Safe system.   [name] was instructed to call 911 with any severe reactions post vaccine: Marland Kitchen Difficulty breathing  . Swelling of face and throat  . A fast heartbeat  . A bad rash all over body  . Dizziness and weakness   Immunizations Administered    Name Date Dose VIS Date Route   Pfizer COVID-19 Vaccine 06/19/2019 11:22 AM 0.3 mL 04/11/2018 Intramuscular   Manufacturer: [employer], [employer]   Lot: [id number]   NDC: [address]-8

## 2019-07-28 ENCOUNTER — Other Ambulatory Visit: Payer: Self-pay | Admitting: Internal Medicine

## 2019-07-31 ENCOUNTER — Observation Stay
Admission: EM | Admit: 2019-07-31 | Discharge: 2019-08-02 | Disposition: A | Discharge status: Home / Self Care | Payer: 59 | Attending: Internal Medicine | Admitting: Internal Medicine

## 2019-07-31 DIAGNOSIS — Z7982 Long term (current) use of aspirin: Secondary | ICD-10-CM | POA: Insufficient documentation

## 2019-07-31 DIAGNOSIS — E1122 Type 2 diabetes mellitus with diabetic chronic kidney disease: Secondary | ICD-10-CM | POA: Diagnosis not present

## 2019-07-31 DIAGNOSIS — J45909 Unspecified asthma, uncomplicated: Secondary | ICD-10-CM | POA: Insufficient documentation

## 2019-07-31 DIAGNOSIS — I129 Hypertensive chronic kidney disease with stage 1 through stage 4 chronic kidney disease, or unspecified chronic kidney disease: Secondary | ICD-10-CM | POA: Insufficient documentation

## 2019-07-31 DIAGNOSIS — Z20822 Contact with and (suspected) exposure to covid-19: Secondary | ICD-10-CM | POA: Diagnosis not present

## 2019-07-31 DIAGNOSIS — I160 Hypertensive urgency: Secondary | ICD-10-CM

## 2019-07-31 DIAGNOSIS — R7989 Other specified abnormal findings of blood chemistry: Secondary | ICD-10-CM | POA: Diagnosis present

## 2019-07-31 DIAGNOSIS — E785 Hyperlipidemia, unspecified: Secondary | ICD-10-CM | POA: Diagnosis present

## 2019-07-31 DIAGNOSIS — K219 Gastro-esophageal reflux disease without esophagitis: Secondary | ICD-10-CM | POA: Diagnosis present

## 2019-07-31 DIAGNOSIS — Z7902 Long term (current) use of antithrombotics/antiplatelets: Secondary | ICD-10-CM | POA: Diagnosis not present

## 2019-07-31 DIAGNOSIS — Z8249 Family history of ischemic heart disease and other diseases of the circulatory system: Secondary | ICD-10-CM | POA: Diagnosis not present

## 2019-07-31 DIAGNOSIS — I639 Cerebral infarction, unspecified: Secondary | ICD-10-CM | POA: Diagnosis present

## 2019-07-31 DIAGNOSIS — I6782 Cerebral ischemia: Secondary | ICD-10-CM | POA: Insufficient documentation

## 2019-07-31 DIAGNOSIS — E1159 Type 2 diabetes mellitus with other circulatory complications: Secondary | ICD-10-CM | POA: Diagnosis present

## 2019-07-31 DIAGNOSIS — I69351 Hemiplegia and hemiparesis following cerebral infarction affecting right dominant side: Secondary | ICD-10-CM | POA: Diagnosis not present

## 2019-07-31 DIAGNOSIS — I161 Hypertensive emergency: Secondary | ICD-10-CM | POA: Diagnosis present

## 2019-07-31 DIAGNOSIS — Z79899 Other long term (current) drug therapy: Secondary | ICD-10-CM | POA: Insufficient documentation

## 2019-07-31 DIAGNOSIS — F419 Anxiety disorder, unspecified: Secondary | ICD-10-CM | POA: Diagnosis not present

## 2019-07-31 DIAGNOSIS — G9389 Other specified disorders of brain: Secondary | ICD-10-CM | POA: Diagnosis not present

## 2019-07-31 DIAGNOSIS — R778 Other specified abnormalities of plasma proteins: Secondary | ICD-10-CM | POA: Diagnosis present

## 2019-07-31 DIAGNOSIS — N1831 Chronic kidney disease, stage 3a: Secondary | ICD-10-CM | POA: Diagnosis not present

## 2019-07-31 DIAGNOSIS — N183 Chronic kidney disease, stage 3 unspecified: Secondary | ICD-10-CM | POA: Diagnosis present

## 2019-07-31 DIAGNOSIS — R079 Chest pain, unspecified: Secondary | ICD-10-CM | POA: Diagnosis not present

## 2019-07-31 DIAGNOSIS — F329 Major depressive disorder, single episode, unspecified: Secondary | ICD-10-CM | POA: Diagnosis not present

## 2019-07-31 DIAGNOSIS — E119 Type 2 diabetes mellitus without complications: Secondary | ICD-10-CM | POA: Diagnosis present

## 2019-07-31 DIAGNOSIS — Z7984 Long term (current) use of oral hypoglycemic drugs: Secondary | ICD-10-CM | POA: Diagnosis not present

## 2019-07-31 DIAGNOSIS — I10 Essential (primary) hypertension: Secondary | ICD-10-CM | POA: Diagnosis present

## 2019-07-31 NOTE — ED Provider Notes (Signed)
[facility] Emergency Department Provider Note  ____________________________________________   First MD Initiated Contact with Patient 07/31/19 2353     (approximate)  I have reviewed the triage vital signs and the nursing notes.   HISTORY  Chief Complaint Weakness    HPI Tami Jones is a 64 y.o. female with below list of previous medical conditions including hypertension hyperlipidemia diabetes mellitus GERD and cerebrovascular accident most recently April of this year presents to the emergency department via EMS secondary to awakening with headache dizziness generalized weakness 30 minutes before arrival to the emergency department.  Patient states that she went to bed at 9:30 PM tonight and had no complaints at that time.  Patient states that when she awoke abruptly before arrival to the emergency department symptoms began.  Noted to be markedly hypertensive for EMS systolic blood pressures exceeding 200 on arrival to the emergency department patient's blood pressure 201/101.  Patient's daughter did give for aspirins before EMS arrival.  Patient denies any chest pain no shortness of breath.       Past Medical History:  Diagnosis Date   Anxiety    Asthma    CVA (cerebral vascular accident) ([place]) 03/2006   right occipital, Dr. Doy Mince   Diabetes mellitus    GERD (gastroesophageal reflux disease)    Hyperlipidemia    Hypertension     Patient Active Problem List   Diagnosis Date Noted   Hypertension    HLD (hyperlipidemia)    Type II diabetes mellitus with renal manifestations ([place])    CKD (chronic kidney disease), stage IIIa    Stroke ([place])    Depression    Gross hematuria 01/15/2019   Dysuria 01/15/2019   Right shoulder pain 12/04/2018   Neuropathic pain, arm 10/26/2018   MDD (major depressive disorder), single episode, moderate ([place]) 09/21/2018   Hemiparesis of right dominant side ([place]) 08/17/2018   SOB (shortness of  breath) 03/08/2017   Vestibular dizziness 01/21/2016   Tick bite 07/15/2015   Cerebrovascular disease 07/17/2014   Preventative health care 06/25/2014   Right sided sciatica 03/08/2013   Chest pain 01/24/2012   MENOPAUSAL SYNDROME 09/10/2008   Mood disorder ([place]) 09/15/2007   Essential hypertension, benign 08/10/2006   Type 2 diabetes mellitus with other circulatory complications ([place]) 18/29/9371   HYPERCHOLESTEROLEMIA 06/17/2006   GERD 06/17/2006    Past Surgical History:  Procedure Laterality Date   CARDIOVASCULAR STRESS TEST  1/15   myoview. EF 60%   CESAREAN SECTION     ESOPHAGOGASTRODUODENOSCOPY  05/2005   TONSILLECTOMY AND ADENOIDECTOMY      Prior to Admission medications   Medication Sig Start Date End Date Taking? Authorizing Provider  metoprolol succinate (TOPROL-XL) 25 MG 24 hr tablet Take 12.5 mg by mouth daily. 07/12/19 07/11/20 Yes [provider]  acetaminophen (TYLENOL) 325 MG tablet Take 650 mg by mouth every 6 (six) hours as needed.    [provider]  amLODipine (NORVASC) 5 MG tablet TAKE 1 TABLET(5 MG) BY MOUTH TWICE DAILY 07/30/19   Viviana Simpler I, MD  aspirin EC 81 MG tablet Take 81 mg by mouth every evening.     [provider]  atorvastatin (LIPITOR) 80 MG tablet Take 1 tablet (80 mg total) by mouth daily. Patient taking differently: Take 80 mg by mouth every evening.  09/21/18 09/21/19  Viviana Simpler I, MD  clopidogrel (PLAVIX) 75 MG tablet Take 1 tablet by mouth every evening.  08/15/18 08/15/19  [provider]  glipiZIDE (  GLUCOTROL XL) 5 MG 24 hr tablet TAKE 1 TABLET BY MOUTH EVERY DAY WITH BREAKFAST 07/30/19   Tillman Abide I, MD  lansoprazole (PREVACID) 30 MG capsule Take 1 capsule (30 mg total) by mouth daily at 12 noon. 08/17/18   Karie Schwalbe, MD  lisinopril-hydrochlorothiazide (ZESTORETIC) 20-25 MG tablet Take 1 tablet by mouth every morning. 04/02/19   Karie Schwalbe, MD     Allergies Citalopram, Doxycycline hyclate, Erythromycin, Montelukast sodium, Nitrofurantoin, Sulfa antibiotics, Tramadol hcl, Venlafaxine hcl, Amoxicillin-pot clavulanate, and Clarithromycin  Family History  Problem Relation Age of Onset   Coronary artery disease Mother    Diabetes Mellitus II Mother    Heart attack Mother 48   Diabetes Mellitus II Maternal Grandmother     Social History Social History   Tobacco Use   Smoking status: Never Smoker   Smokeless tobacco: Never Used  Building services engineer Use: Never used  Substance Use Topics   Alcohol use: No    Alcohol/week: 0.0 standard drinks    Comment: wine occassionally   Drug use: No    Review of Systems Constitutional: No fever/chills Eyes: No visual changes. ENT: No sore throat. Cardiovascular: Denies chest pain. Respiratory: Denies shortness of breath. Gastrointestinal: No abdominal pain.  No nausea, no vomiting.  No diarrhea.  No constipation. Genitourinary: Negative for dysuria. Musculoskeletal: Negative for neck pain.  Negative for back pain. Integumentary: Negative for rash. Neurological: Positive for headache dizziness and generalized weakness  ____________________________________________   PHYSICAL EXAM:  VITAL SIGNS: ED Triage Vitals  Enc Vitals Group     BP      Pulse      Resp      Temp      Temp src      SpO2      Weight      Height      Head Circumference      Peak Flow      Pain Score      Pain Loc      Pain Edu?      Excl. in GC?     Constitutional: Alert and oriented. Eyes: Conjunctivae are normal.  Head: Atraumatic. Mouth/Throat: Patient is wearing a mask. Neck: No stridor.  No meningeal signs.   Cardiovascular: Normal rate, regular rhythm. Good peripheral circulation. Grossly normal heart sounds. Respiratory: Normal respiratory effort.  No retractions. Gastrointestinal: Soft and nontender. No distention.  Musculoskeletal: No lower extremity tenderness nor edema.  No gross deformities of extremities. Neurologic:  Normal speech and language. No gross focal neurologic deficits are appreciated.  Skin:  Skin is warm, dry and intact. Psychiatric: Mood and affect are normal. Speech and behavior are normal.  ____________________________________________   LABS (all labs ordered are listed, but only abnormal results are displayed)  Labs Reviewed  COMPREHENSIVE METABOLIC PANEL - Abnormal; Notable for the following components:      Result Value   Glucose, Bld 227 (*)    Creatinine, Ser 1.07 (*)    GFR calc non Af Amer 56 (*)    All other components within normal limits  GLUCOSE, CAPILLARY - Abnormal; Notable for the following components:   Glucose-Capillary 219 (*)    All other components within normal limits  TROPONIN I (HIGH SENSITIVITY) - Abnormal; Notable for the following components:   Troponin I (High Sensitivity) 21 (*)    All other components within normal limits  TROPONIN I (HIGH SENSITIVITY) - Abnormal; Notable for the following components:   Troponin I (  High Sensitivity) 39 (*)    All other components within normal limits  TROPONIN I (HIGH SENSITIVITY) - Abnormal; Notable for the following components:   Troponin I (High Sensitivity) 43 (*)    All other components within normal limits  CBC   ____________________________________________  EKG  ED ECG REPORT I, [place] N Olivier Frayre, the attending physician, personally viewed and interpreted this ECG.   Date: 08/01/2019  EKG Time: 6:46 AM  Rate: 83  Rhythm: Sinus rhythm  Axis: Normal  Intervals: QTC 479  ST&T Change: None  ____________________________________________  RADIOLOGY I, [place] N Laquisha Northcraft, personally viewed and evaluated these images (plain radiographs) as part of my medical decision making, as well as reviewing the written report by the radiologist.  ED MD interpretation: no acute intracranial abnormality noted on CT.  MRI/MRA revealed no acute intracranial abnormalities per  radiologist  Official radiology report(s): CT Head Wo Contrast  Result Date: 08/01/2019 CLINICAL DATA:  Headache EXAM: CT HEAD WITHOUT CONTRAST TECHNIQUE: Contiguous axial images were obtained from the base of the skull through the vertex without intravenous contrast. COMPARISON:  06/09/2019 FINDINGS: Brain: Bilateral old occipital infarcts. Old bilateral basal ganglia lacunar infarcts. No acute intracranial abnormality. Specifically, no hemorrhage, hydrocephalus, mass lesion, acute infarction, or significant intracranial injury. Vascular: No hyperdense vessel or unexpected calcification. Skull: No acute calvarial abnormality. Sinuses/Orbits: Visualized paranasal sinuses and mastoids clear. Orbital soft tissues unremarkable. Other: None IMPRESSION: Old infarcts as above.  No acute intracranial abnormality. Electronically Signed   By: Charlett Nose M.D.   On: 08/01/2019 00:17   MR ANGIO HEAD WO CONTRAST  Result Date: 08/01/2019 CLINICAL DATA:  Initial evaluation for acute headache EXAM: MRI HEAD WITHOUT CONTRAST MRA HEAD WITHOUT CONTRAST MRA NECK WITHOUT AND WITH CONTRAST TECHNIQUE: Multiplanar, multiecho pulse sequences of the brain and surrounding structures were obtained without intravenous contrast. Angiographic images of the Circle of Willis were obtained using MRA technique without intravenous contrast. Angiographic images of the neck were obtained using MRA technique without and with intravenous contrast. Carotid stenosis measurements (when applicable) are obtained utilizing NASCET criteria, using the distal internal carotid diameter as the denominator. CONTRAST:  10 cc of Gadavist. COMPARISON:  Comparison made with prior head CT from earlier the same day as well as previous brain MRI from 06/09/2019. FINDINGS: MRI HEAD FINDINGS Brain: Generalized age-related cerebral atrophy. Patchy T2/FLAIR hyperintensity within the periventricular and deep white matter both cerebral hemispheres most consistent with  chronic small vessel ischemic disease. Mild patchy involvement of the pons. Superimposed remote lacunar infarcts seen involving both basal ganglia. Encephalomalacia and gliosis involving the bilateral occipital lobes consistent with chronic infarctions as well. No abnormal foci of restricted diffusion to suggest acute or subacute ischemia. Gray-white matter differentiation maintained. No areas of chronic cortical infarction elsewhere within the brain. No evidence for acute or chronic intracranial hemorrhage. No mass lesion, midline shift or mass effect. No hydrocephalus or extra-axial fluid collection. Pituitary gland suprasellar region normal. Midline structures intact. Vascular: Major intracranial vascular flow voids are maintained. Skull and upper cervical spine: Craniocervical junction within normal limits. Bone marrow signal intensity normal. No scalp soft tissue abnormality. Sinuses/Orbits: Globes and orbital soft tissues within normal limits. Paranasal sinuses are clear. No significant mastoid effusion. Inner ear structures grossly normal. Other: None. MRA HEAD FINDINGS ANTERIOR CIRCULATION: Visualized distal cervical segments of the internal carotid arteries are patent with symmetric antegrade flow. Petrous segments widely patent bilaterally. Scattered atheromatous irregularity throughout the cavernous/supraclinoid ICAs bilaterally, right greater than left. No significant stenosis  seen on the left. Short-segment moderate stenosis noted at the proximal cavernous right ICA (series 1084, image 3). Short-segment fenestration noted at the supraclinoid left ICA. Short-segment severe atheromatous stenoses noted at the origins of both A1 segments (series 1084, image 10). Normal anterior communicating artery complex. Short-segment moderate proximal right A2 stenosis (series 1084, image 13). ACAs otherwise patent distally without flow-limiting stenosis. M1 segments patent bilaterally. Normal MCA bifurcations. Distal  MCA branches well perfused and symmetric. Diffuse small vessel atheromatous irregularity seen throughout the anterior circulation. POSTERIOR CIRCULATION: Vertebral arteries patent as they course into the cranial vault. Right vertebral artery hypoplastic and terminates in PICA. Severe tandem segmental stenoses noted involving the distal left V4 segment (series 1009, image 12). Both picas patent. Basilar diffusely irregular but remains patent without high-grade stenosis. Severe stenosis at the origin of the left SCA (series 1009, image 11). Moderate to severe stenoses noted involving the proximal and mid right SCA. Both PCAs primarily supplied via the basilar. Severe proximal right P1 stenosis, with additional severe short-segment right P2 stenosis (series 1003, image 12). Diffuse atheromatous irregularity throughout the remainder the PCAs without high-grade stenosis. No intracranial aneurysm. MRA NECK FINDINGS AORTIC ARCH: Examination technically limited by motion artifact and timing of the contrast bolus. Visualized aortic arch of normal caliber with normal branch pattern. No appreciable flow-limiting stenosis about the origin of the great vessels. RIGHT CAROTID SYSTEM: Antegrade and widely patent flow seen within the right common and internal carotid arteries to the skull base. No appreciable stenosis seen about the right carotid bifurcation. Right ICA partially medialized into the retropharyngeal space. LEFT CAROTID SYSTEM: Antegrade and widely patent flow seen within the left common and internal carotid arteries to the skull base. No appreciable significant stenosis about the left bifurcation. Left ICA partially medialized into the retropharyngeal space. VERTEBRAL ARTERIES: Dominant left vertebral artery demonstrates widely patent antegrade flow. Hypoplastic right vertebral artery largely occluded proximally, with no significant flow seen until the distal V2/V3 segments. Right vertebral artery is patent as it  courses into the cranial vault. IMPRESSION: MRI HEAD IMPRESSION: 1. No acute intracranial abnormality. 2. Chronic microvascular ischemic disease with associated remote lacunar infarcts involving the bilateral basal ganglia, with additional chronic bilateral occipital lobe infarcts. MRA HEAD IMPRESSION: 1. Negative MRA for large vessel occlusion. 2. Extensive atherosclerotic disease involving the anterior circulation with associated severe bilateral A1 and proximal right A2 stenoses as above. Additional more moderate stenosis at the proximal cavernous right ICA. 3. Severe tandem stenoses involving the distal left V4 segment, with additional severe bilateral SCA, right P1, and right P2 stenoses as above. Left vertebral artery dominant, with the hypoplastic right vertebral artery terminating in PICA. MRA NECK IMPRESSION: 1. No significant flow within the hypoplastic right vertebral artery until the distal V2/V3 segments. Dominant left vertebral artery widely patent within the neck. 2. Wide patency of both carotid artery systems within the neck. Electronically Signed   By: Rise Mu M.D.   On: 08/01/2019 03:24   MR Angiogram Neck W or Wo Contrast  Result Date: 08/01/2019 CLINICAL DATA:  Initial evaluation for acute headache EXAM: MRI HEAD WITHOUT CONTRAST MRA HEAD WITHOUT CONTRAST MRA NECK WITHOUT AND WITH CONTRAST TECHNIQUE: Multiplanar, multiecho pulse sequences of the brain and surrounding structures were obtained without intravenous contrast. Angiographic images of the Circle of Willis were obtained using MRA technique without intravenous contrast. Angiographic images of the neck were obtained using MRA technique without and with intravenous contrast. Carotid stenosis measurements (when applicable) are obtained  utilizing NASCET criteria, using the distal internal carotid diameter as the denominator. CONTRAST:  10 cc of Gadavist. COMPARISON:  Comparison made with prior head CT from earlier the same day  as well as previous brain MRI from 06/09/2019. FINDINGS: MRI HEAD FINDINGS Brain: Generalized age-related cerebral atrophy. Patchy T2/FLAIR hyperintensity within the periventricular and deep white matter both cerebral hemispheres most consistent with chronic small vessel ischemic disease. Mild patchy involvement of the pons. Superimposed remote lacunar infarcts seen involving both basal ganglia. Encephalomalacia and gliosis involving the bilateral occipital lobes consistent with chronic infarctions as well. No abnormal foci of restricted diffusion to suggest acute or subacute ischemia. Gray-white matter differentiation maintained. No areas of chronic cortical infarction elsewhere within the brain. No evidence for acute or chronic intracranial hemorrhage. No mass lesion, midline shift or mass effect. No hydrocephalus or extra-axial fluid collection. Pituitary gland suprasellar region normal. Midline structures intact. Vascular: Major intracranial vascular flow voids are maintained. Skull and upper cervical spine: Craniocervical junction within normal limits. Bone marrow signal intensity normal. No scalp soft tissue abnormality. Sinuses/Orbits: Globes and orbital soft tissues within normal limits. Paranasal sinuses are clear. No significant mastoid effusion. Inner ear structures grossly normal. Other: None. MRA HEAD FINDINGS ANTERIOR CIRCULATION: Visualized distal cervical segments of the internal carotid arteries are patent with symmetric antegrade flow. Petrous segments widely patent bilaterally. Scattered atheromatous irregularity throughout the cavernous/supraclinoid ICAs bilaterally, right greater than left. No significant stenosis seen on the left. Short-segment moderate stenosis noted at the proximal cavernous right ICA (series 1084, image 3). Short-segment fenestration noted at the supraclinoid left ICA. Short-segment severe atheromatous stenoses noted at the origins of both A1 segments (series 1084, image  10). Normal anterior communicating artery complex. Short-segment moderate proximal right A2 stenosis (series 1084, image 13). ACAs otherwise patent distally without flow-limiting stenosis. M1 segments patent bilaterally. Normal MCA bifurcations. Distal MCA branches well perfused and symmetric. Diffuse small vessel atheromatous irregularity seen throughout the anterior circulation. POSTERIOR CIRCULATION: Vertebral arteries patent as they course into the cranial vault. Right vertebral artery hypoplastic and terminates in PICA. Severe tandem segmental stenoses noted involving the distal left V4 segment (series 1009, image 12). Both picas patent. Basilar diffusely irregular but remains patent without high-grade stenosis. Severe stenosis at the origin of the left SCA (series 1009, image 11). Moderate to severe stenoses noted involving the proximal and mid right SCA. Both PCAs primarily supplied via the basilar. Severe proximal right P1 stenosis, with additional severe short-segment right P2 stenosis (series 1003, image 12). Diffuse atheromatous irregularity throughout the remainder the PCAs without high-grade stenosis. No intracranial aneurysm. MRA NECK FINDINGS AORTIC ARCH: Examination technically limited by motion artifact and timing of the contrast bolus. Visualized aortic arch of normal caliber with normal branch pattern. No appreciable flow-limiting stenosis about the origin of the great vessels. RIGHT CAROTID SYSTEM: Antegrade and widely patent flow seen within the right common and internal carotid arteries to the skull base. No appreciable stenosis seen about the right carotid bifurcation. Right ICA partially medialized into the retropharyngeal space. LEFT CAROTID SYSTEM: Antegrade and widely patent flow seen within the left common and internal carotid arteries to the skull base. No appreciable significant stenosis about the left bifurcation. Left ICA partially medialized into the retropharyngeal space. VERTEBRAL  ARTERIES: Dominant left vertebral artery demonstrates widely patent antegrade flow. Hypoplastic right vertebral artery largely occluded proximally, with no significant flow seen until the distal V2/V3 segments. Right vertebral artery is patent as it courses into the cranial vault. IMPRESSION:  MRI HEAD IMPRESSION: 1. No acute intracranial abnormality. 2. Chronic microvascular ischemic disease with associated remote lacunar infarcts involving the bilateral basal ganglia, with additional chronic bilateral occipital lobe infarcts. MRA HEAD IMPRESSION: 1. Negative MRA for large vessel occlusion. 2. Extensive atherosclerotic disease involving the anterior circulation with associated severe bilateral A1 and proximal right A2 stenoses as above. Additional more moderate stenosis at the proximal cavernous right ICA. 3. Severe tandem stenoses involving the distal left V4 segment, with additional severe bilateral SCA, right P1, and right P2 stenoses as above. Left vertebral artery dominant, with the hypoplastic right vertebral artery terminating in PICA. MRA NECK IMPRESSION: 1. No significant flow within the hypoplastic right vertebral artery until the distal V2/V3 segments. Dominant left vertebral artery widely patent within the neck. 2. Wide patency of both carotid artery systems within the neck. Electronically Signed   By: Rise Mu M.D.   On: 08/01/2019 03:24   MR BRAIN WO CONTRAST  Result Date: 08/01/2019 CLINICAL DATA:  Initial evaluation for acute headache EXAM: MRI HEAD WITHOUT CONTRAST MRA HEAD WITHOUT CONTRAST MRA NECK WITHOUT AND WITH CONTRAST TECHNIQUE: Multiplanar, multiecho pulse sequences of the brain and surrounding structures were obtained without intravenous contrast. Angiographic images of the Circle of Willis were obtained using MRA technique without intravenous contrast. Angiographic images of the neck were obtained using MRA technique without and with intravenous contrast. Carotid stenosis  measurements (when applicable) are obtained utilizing NASCET criteria, using the distal internal carotid diameter as the denominator. CONTRAST:  10 cc of Gadavist. COMPARISON:  Comparison made with prior head CT from earlier the same day as well as previous brain MRI from 06/09/2019. FINDINGS: MRI HEAD FINDINGS Brain: Generalized age-related cerebral atrophy. Patchy T2/FLAIR hyperintensity within the periventricular and deep white matter both cerebral hemispheres most consistent with chronic small vessel ischemic disease. Mild patchy involvement of the pons. Superimposed remote lacunar infarcts seen involving both basal ganglia. Encephalomalacia and gliosis involving the bilateral occipital lobes consistent with chronic infarctions as well. No abnormal foci of restricted diffusion to suggest acute or subacute ischemia. Gray-white matter differentiation maintained. No areas of chronic cortical infarction elsewhere within the brain. No evidence for acute or chronic intracranial hemorrhage. No mass lesion, midline shift or mass effect. No hydrocephalus or extra-axial fluid collection. Pituitary gland suprasellar region normal. Midline structures intact. Vascular: Major intracranial vascular flow voids are maintained. Skull and upper cervical spine: Craniocervical junction within normal limits. Bone marrow signal intensity normal. No scalp soft tissue abnormality. Sinuses/Orbits: Globes and orbital soft tissues within normal limits. Paranasal sinuses are clear. No significant mastoid effusion. Inner ear structures grossly normal. Other: None. MRA HEAD FINDINGS ANTERIOR CIRCULATION: Visualized distal cervical segments of the internal carotid arteries are patent with symmetric antegrade flow. Petrous segments widely patent bilaterally. Scattered atheromatous irregularity throughout the cavernous/supraclinoid ICAs bilaterally, right greater than left. No significant stenosis seen on the left. Short-segment moderate  stenosis noted at the proximal cavernous right ICA (series 1084, image 3). Short-segment fenestration noted at the supraclinoid left ICA. Short-segment severe atheromatous stenoses noted at the origins of both A1 segments (series 1084, image 10). Normal anterior communicating artery complex. Short-segment moderate proximal right A2 stenosis (series 1084, image 13). ACAs otherwise patent distally without flow-limiting stenosis. M1 segments patent bilaterally. Normal MCA bifurcations. Distal MCA branches well perfused and symmetric. Diffuse small vessel atheromatous irregularity seen throughout the anterior circulation. POSTERIOR CIRCULATION: Vertebral arteries patent as they course into the cranial vault. Right vertebral artery hypoplastic and terminates in PICA.  Severe tandem segmental stenoses noted involving the distal left V4 segment (series 1009, image 12). Both picas patent. Basilar diffusely irregular but remains patent without high-grade stenosis. Severe stenosis at the origin of the left SCA (series 1009, image 11). Moderate to severe stenoses noted involving the proximal and mid right SCA. Both PCAs primarily supplied via the basilar. Severe proximal right P1 stenosis, with additional severe short-segment right P2 stenosis (series 1003, image 12). Diffuse atheromatous irregularity throughout the remainder the PCAs without high-grade stenosis. No intracranial aneurysm. MRA NECK FINDINGS AORTIC ARCH: Examination technically limited by motion artifact and timing of the contrast bolus. Visualized aortic arch of normal caliber with normal branch pattern. No appreciable flow-limiting stenosis about the origin of the great vessels. RIGHT CAROTID SYSTEM: Antegrade and widely patent flow seen within the right common and internal carotid arteries to the skull base. No appreciable stenosis seen about the right carotid bifurcation. Right ICA partially medialized into the retropharyngeal space. LEFT CAROTID SYSTEM:  Antegrade and widely patent flow seen within the left common and internal carotid arteries to the skull base. No appreciable significant stenosis about the left bifurcation. Left ICA partially medialized into the retropharyngeal space. VERTEBRAL ARTERIES: Dominant left vertebral artery demonstrates widely patent antegrade flow. Hypoplastic right vertebral artery largely occluded proximally, with no significant flow seen until the distal V2/V3 segments. Right vertebral artery is patent as it courses into the cranial vault. IMPRESSION: MRI HEAD IMPRESSION: 1. No acute intracranial abnormality. 2. Chronic microvascular ischemic disease with associated remote lacunar infarcts involving the bilateral basal ganglia, with additional chronic bilateral occipital lobe infarcts. MRA HEAD IMPRESSION: 1. Negative MRA for large vessel occlusion. 2. Extensive atherosclerotic disease involving the anterior circulation with associated severe bilateral A1 and proximal right A2 stenoses as above. Additional more moderate stenosis at the proximal cavernous right ICA. 3. Severe tandem stenoses involving the distal left V4 segment, with additional severe bilateral SCA, right P1, and right P2 stenoses as above. Left vertebral artery dominant, with the hypoplastic right vertebral artery terminating in PICA. MRA NECK IMPRESSION: 1. No significant flow within the hypoplastic right vertebral artery until the distal V2/V3 segments. Dominant left vertebral artery widely patent within the neck. 2. Wide patency of both carotid artery systems within the neck. Electronically Signed   By: Rise MuBenjamin  McClintock M.D.   On: 08/01/2019 03:24      Procedures   ____________________________________________   INITIAL IMPRESSION / MDM / ASSESSMENT AND PLAN / ED COURSE  As part of my medical decision making, I reviewed the following data within the electronic MEDICAL RECORD NUMBER 64 year old female presented with above-stated history and physical  exam a differential diagnosis including but not limited to CVA, cerebral aneurysm.  CT head did not reveal any acute intracranial abnormality.  MRI likewise revealed no acute findings.  Patient noted to be markedly hypertensive on arrival and as such was given labetalol 10 mg IV with improvement in blood pressure.  Patient's laboratory data revealed an elevated troponin which continue to elevate while in the emergency department.  EKG revealed no evidence of ischemia or infarction and as such concern for possible hypertensive emergency.  ____________________________________________  FINAL CLINICAL IMPRESSION(S) / ED DIAGNOSES  Hypertensive emergency  MEDICATIONS GIVEN DURING THIS VISIT:  Medications  labetalol (NORMODYNE) injection 10 mg (10 mg Intravenous Given 08/01/19 0217)  gadobutrol (GADAVIST) 1 MMOL/ML injection 10 mL (10 mLs Intravenous Contrast Given 08/01/19 0243)  LORazepam (ATIVAN) tablet 0.5 mg (0.5 mg Oral Given 08/01/19 0511)  ED Discharge Orders    None      *Please note:  Tami Jones was evaluated in Emergency Department on 08/01/2019 for the symptoms described in the history of present illness. She was evaluated in the context of the global COVID-19 pandemic, which necessitated consideration that the patient might be at risk for infection with the SARS-CoV-2 virus that causes COVID-19. Institutional protocols and algorithms that pertain to the evaluation of patients at risk for COVID-19 are in a state of rapid change based on information released by regulatory bodies including the CDC and federal and state organizations. These policies and algorithms were followed during the patient's care in the ED.  Some ED evaluations and interventions may be delayed as a result of limited staffing during the pandemic.*  Note:  This document was prepared using Dragon voice recognition software and may include unintentional dictation errors.   Darci Current, MD 08/03/19 [phone]

## 2019-07-31 NOTE — ED Triage Notes (Signed)
Pt arrives [facility] from home w cc of weakness. Pt has hx of stroke, right side deficit at baseline. Pt has hx hypertension, she will get headaches and feel weak. Pt states it's hard to swallow and may be due to her BP.  Pt daguhter gave her 4 aspirin before calling 911, ST on EMS ekg

## 2019-08-01 ENCOUNTER — Emergency Department: Payer: 59

## 2019-08-01 ENCOUNTER — Other Ambulatory Visit: Payer: Self-pay

## 2019-08-01 DIAGNOSIS — I161 Hypertensive emergency: Secondary | ICD-10-CM | POA: Diagnosis present

## 2019-08-01 DIAGNOSIS — K219 Gastro-esophageal reflux disease without esophagitis: Secondary | ICD-10-CM | POA: Diagnosis not present

## 2019-08-01 DIAGNOSIS — N1831 Chronic kidney disease, stage 3a: Secondary | ICD-10-CM | POA: Diagnosis not present

## 2019-08-01 DIAGNOSIS — R778 Other specified abnormalities of plasma proteins: Secondary | ICD-10-CM | POA: Diagnosis present

## 2019-08-01 DIAGNOSIS — R7989 Other specified abnormal findings of blood chemistry: Secondary | ICD-10-CM | POA: Diagnosis present

## 2019-08-01 LAB — GLUCOSE, CAPILLARY
Glucose-Capillary: 219 mg/dL — ABNORMAL HIGH (ref 70–99)
Glucose-Capillary: 151 mg/dL — ABNORMAL HIGH (ref 70–99)
Glucose-Capillary: 174 mg/dL — ABNORMAL HIGH (ref 70–99)
Glucose-Capillary: 244 mg/dL — ABNORMAL HIGH (ref 70–99)
Glucose-Capillary: 267 mg/dL — ABNORMAL HIGH (ref 70–99)

## 2019-08-01 LAB — COMPREHENSIVE METABOLIC PANEL
ALT: 29 U/L (ref 0–44)
AST: 24 U/L (ref 15–41)
Albumin: 3.9 g/dL (ref 3.5–5.0)
Alkaline Phosphatase: 85 U/L (ref 38–126)
Anion gap: 11 (ref 5–15)
BUN: 21 mg/dL (ref 8–23)
CO2: 24 mmol/L (ref 22–32)
Calcium: 9.1 mg/dL (ref 8.9–10.3)
Chloride: 104 mmol/L (ref 98–111)
Creatinine, Ser: 1.07 mg/dL — ABNORMAL HIGH (ref 0.44–1.00)
GFR calc Af Amer: >60 mL/min (ref >60)
GFR calc non Af Amer: 56 mL/min — ABNORMAL LOW (ref >60)
Glucose, Bld: 227 mg/dL — ABNORMAL HIGH (ref 70–99)
Potassium: 4.1 mmol/L (ref 3.5–5.1)
Sodium: 139 mmol/L (ref 135–145)
Total Bilirubin: 0.7 mg/dL (ref 0.3–1.2)
Total Protein: 6.8 g/dL (ref 6.5–8.1)

## 2019-08-01 LAB — CBC
HCT: 40 % (ref 36.0–46.0)
Hemoglobin: 13.7 g/dL (ref 12.0–15.0)
MCH: 31.2 pg (ref 26.0–34.0)
MCHC: 34.3 g/dL (ref 30.0–36.0)
MCV: 91.1 fL (ref 80.0–100.0)
Platelets: 213 10*3/uL (ref 150–400)
RBC: 4.39 MIL/uL (ref 3.87–5.11)
RDW: 12.2 % (ref 11.5–15.5)
WBC: 8.6 10*3/uL (ref 4.0–10.5)
nRBC: 0 % (ref 0.0–0.2)

## 2019-08-01 LAB — TROPONIN I (HIGH SENSITIVITY)
Troponin I (High Sensitivity): 21 ng/L — ABNORMAL HIGH (ref <18)
Troponin I (High Sensitivity): 39 ng/L — ABNORMAL HIGH (ref <18)
Troponin I (High Sensitivity): 43 ng/L — ABNORMAL HIGH (ref <18)
Troponin I (High Sensitivity): 43 ng/L — ABNORMAL HIGH (ref <18)

## 2019-08-01 LAB — SARS CORONAVIRUS 2 BY RT PCR (HOSPITAL ORDER, PERFORMED IN ~~LOC~~ HOSPITAL LAB): SARS Coronavirus 2: NEGATIVE

## 2019-08-01 MED ORDER — ATORVASTATIN CALCIUM 20 MG PO TABS
80.0000 mg | ORAL_TABLET | Freq: Every evening | ORAL | Status: DC
  Administered 2019-08-01: 80 mg via ORAL
  Filled 2019-08-01: qty 4

## 2019-08-01 MED ORDER — ALBUTEROL SULFATE (2.5 MG/3ML) 0.083% IN NEBU: 2.5000 mg | INHALATION_SOLUTION | RESPIRATORY_TRACT | Status: DC | PRN

## 2019-08-01 MED ORDER — AMLODIPINE BESYLATE 5 MG PO TABS
5.0000 mg | ORAL_TABLET | Freq: Every day | ORAL | Status: DC
  Administered 2019-08-02 (×2): 5 mg via ORAL
  Filled 2019-08-01: qty 1

## 2019-08-01 MED ORDER — INSULIN ASPART 100 UNIT/ML ~~LOC~~ SOLN
0.0000 [IU] | Freq: Every day | SUBCUTANEOUS | Status: DC
  Administered 2019-08-01: 22:00:00 3 [IU] via SUBCUTANEOUS
  Filled 2019-08-01: qty 1

## 2019-08-01 MED ORDER — NITROGLYCERIN 0.4 MG SL SUBL: 0.4000 mg | SUBLINGUAL_TABLET | SUBLINGUAL | Status: DC | PRN

## 2019-08-01 MED ORDER — HYDRALAZINE HCL 20 MG/ML IJ SOLN: 5.0000 mg | INTRAMUSCULAR | Status: DC | PRN

## 2019-08-01 MED ORDER — ONDANSETRON HCL 4 MG/2ML IJ SOLN
4.0000 mg | Freq: Three times a day (TID) | INTRAMUSCULAR | Status: DC | PRN
  Administered 2019-08-01: 4 mg via INTRAVENOUS
  Filled 2019-08-01: qty 2

## 2019-08-01 MED ORDER — ASPIRIN EC 81 MG PO TBEC: 81.0000 mg | DELAYED_RELEASE_TABLET | Freq: Every evening | ORAL | Status: DC

## 2019-08-01 MED ORDER — HYDROCHLOROTHIAZIDE 25 MG PO TABS
25.0000 mg | ORAL_TABLET | Freq: Every day | ORAL | Status: DC
  Administered 2019-08-02: 09:00:00 25 mg via ORAL
  Filled 2019-08-01 (×2): qty 1

## 2019-08-01 MED ORDER — ALBUTEROL SULFATE HFA 108 (90 BASE) MCG/ACT IN AERS: 2.0000 | INHALATION_SPRAY | RESPIRATORY_TRACT | Status: DC | PRN

## 2019-08-01 MED ORDER — LISINOPRIL 20 MG PO TABS
20.0000 mg | ORAL_TABLET | Freq: Every day | ORAL | Status: DC
  Administered 2019-08-02: 20 mg via ORAL
  Filled 2019-08-01: qty 1

## 2019-08-01 MED ORDER — LISINOPRIL-HYDROCHLOROTHIAZIDE 20-25 MG PO TABS: 1.0000 | ORAL_TABLET | ORAL | Status: DC

## 2019-08-01 MED ORDER — CLOPIDOGREL BISULFATE 75 MG PO TABS
75.0000 mg | ORAL_TABLET | Freq: Every evening | ORAL | Status: DC
  Administered 2019-08-01: 75 mg via ORAL
  Filled 2019-08-01: qty 1

## 2019-08-01 MED ORDER — LORAZEPAM 0.5 MG PO TABS
0.5000 mg | ORAL_TABLET | Freq: Once | ORAL | Status: AC
  Administered 2019-08-01: 0.5 mg via ORAL
  Filled 2019-08-01: qty 1

## 2019-08-01 MED ORDER — INSULIN ASPART 100 UNIT/ML ~~LOC~~ SOLN
0.0000 [IU] | Freq: Three times a day (TID) | SUBCUTANEOUS | Status: DC
  Administered 2019-08-01: 2 [IU] via SUBCUTANEOUS
  Administered 2019-08-01: 3 [IU] via SUBCUTANEOUS
  Administered 2019-08-01 – 2019-08-02 (×3): 2 [IU] via SUBCUTANEOUS
  Filled 2019-08-01 (×5): qty 1

## 2019-08-01 MED ORDER — ACETAMINOPHEN 325 MG PO TABS
650.0000 mg | ORAL_TABLET | Freq: Four times a day (QID) | ORAL | Status: DC | PRN
  Administered 2019-08-01: 22:00:00 650 mg via ORAL
  Filled 2019-08-01: qty 2

## 2019-08-01 MED ORDER — LABETALOL HCL 5 MG/ML IV SOLN
10.0000 mg | Freq: Once | INTRAVENOUS | Status: AC
  Administered 2019-08-01: 10 mg via INTRAVENOUS
  Filled 2019-08-01: qty 4

## 2019-08-01 MED ORDER — GADOBUTROL 1 MMOL/ML IV SOLN
10.0000 mL | Freq: Once | INTRAVENOUS | Status: AC | PRN
  Administered 2019-08-01: 10 mL via INTRAVENOUS

## 2019-08-01 MED ORDER — PANTOPRAZOLE SODIUM 20 MG PO TBEC
20.0000 mg | DELAYED_RELEASE_TABLET | Freq: Every day | ORAL | Status: DC
  Administered 2019-08-01 – 2019-08-02 (×2): 20 mg via ORAL
  Filled 2019-08-01 (×2): qty 1

## 2019-08-01 MED ORDER — METOPROLOL SUCCINATE ER 25 MG PO TB24
12.5000 mg | ORAL_TABLET | Freq: Every day | ORAL | Status: DC
  Administered 2019-08-01 – 2019-08-02 (×2): 12.5 mg via ORAL
  Filled 2019-08-01: qty 1
  Filled 2019-08-01: qty 0.5

## 2019-08-01 NOTE — ED Notes (Signed)
Pt transported to Ct.

## 2019-08-01 NOTE — ED Notes (Signed)
Purewick placed on pt after pt attempted to get up and fell back into bed stating "i'm dizzy". Pt transported to MRI.

## 2019-08-01 NOTE — ED Notes (Signed)
RN spoke with Tami Jones for [facility] and was informed that due to staffing, they will be unable to accept this patient at this time.   Report can be called after 1930 as long as staffing has been allocated.

## 2019-08-01 NOTE — Progress Notes (Signed)
Cross Cover Brief Note Patient admitted for hypertensive emergency now with blood pressures well controlled on oral meds with intermittent IV hydrazine ordered if needed.  She is not requiring continuous IV vasoactive medications therefore bed request changed to med surg with cardiac telemetry

## 2019-08-01 NOTE — ED Notes (Signed)
Patient provided hot breakfast meal at this time.

## 2019-08-01 NOTE — Evaluation (Signed)
Occupational Therapy Evaluation Patient Details Name: Tami Jones MRN: [id number] DOB: Apr 29, 1955 Today's Date: 08/01/2019    History of Present Illness Pt is a 64 y/o F with PMH: CKD3, stroke with R sided weakness, HTN, HLD, DM, asthma, depression, and anxiety. Presented d/t feeling weak/dizzy. MRI of head negative for acute abnormality, MRA of head negative for large vessel occlusion. Pt adm with hypertensive emergency with SBPs on adm reaching 200s. Pt with more stable BPs at time of assessment.   Clinical Impression   Pt was seen for OT evaluation this date. Prior to hospital admission, pt was MOD I with fxl mobility with QC, Indep to MOD I with UB ADLs, and required MOD A for LB ADLs. Pt lives with her dtr, SIL and two grandchildren. Currently pt demonstrates impairments as described below (See OT problem list) which functionally limit her ability to perform ADL/self-care tasks. Pt currently requires MIN A/CGA for ADL transfers/mobility with RW and is at baseline MOD A for LB ADLs.  Pt would benefit from skilled OT to address noted impairments and functional limitations (see below for any additional details) in order to maximize safety and independence while minimizing falls risk. Do not currently anticipate need for OT f/u upon d/c.     Follow Up Recommendations  Supervision - Intermittent;No OT follow up    Equipment Recommendations  3 in 1 bedside commode    Recommendations for Other Services       Precautions / Restrictions Precautions Precautions: Fall Restrictions Weight Bearing Restrictions: No      Mobility Bed Mobility Overal bed mobility: Modified Independent             General bed mobility comments: extended time  Transfers Overall transfer level: Needs assistance Equipment used: Rolling walker (2 wheeled) Transfers: Sit to/from [employer] Sit to Stand: Min guard;Min assist Stand pivot transfers: Min guard;Min assist             Balance Overall balance assessment: Needs assistance   Sitting balance-Leahy Scale: Good     Standing balance support: Bilateral upper extremity supported Standing balance-Leahy Scale: Fair Standing balance comment: requires CGA and RW for B UE support for static standing balance, MIN A for fxl mobility                           ADL either performed or assessed with clinical judgement   ADL Overall ADL's : Needs assistance/impaired Eating/Feeding: Independent   Grooming: Wash/dry hands;Wash/dry face;Oral care;Set up;Sitting   Upper Body Bathing: Set up;Sitting   Lower Body Bathing: Moderate assistance;Sit to/from stand   Upper Body Dressing : Set up;Sitting   Lower Body Dressing: Moderate assistance;Sit to/from stand Lower Body Dressing Details (indicate cue type and reason): able to thread some larger items, but difficulty with socks/shoes. Wears socks at baseline. Toilet Transfer: Minimal assistance;Stand-pivot;BSC           Functional mobility during ADLs: Min guard;Minimal assistance;Rolling walker (to take 4-5 steps FWD/BKWD)       Vision Patient Visual Report: No change from baseline       Perception     Praxis      Pertinent Vitals/Pain Pain Assessment: 0-10 Pain Location: L forearm Pain Descriptors / Indicators: Burning Pain Intervention(s): Limited activity within patient's tolerance;Monitored during session     Hand Dominance Right   Extremity/Trunk Assessment Upper Extremity Assessment Upper Extremity Assessment: RUE deficits/detail;LUE deficits/detail RUE Deficits / Details: shld flexion to 1/2  arc of motion, elbow and grip MMT 3+/5 LUE Deficits / Details: Shld flexion ROM to 3/4 arc of motion, elbow and grip MMT 4-/5   Lower Extremity Assessment Lower Extremity Assessment: Defer to PT evaluation       Communication Communication Communication: No difficulties   Cognition Arousal/Alertness: Awake/alert Behavior During  Therapy: WFL for tasks assessed/performed Overall Cognitive Status: Within Functional Limits for tasks assessed                                     General Comments       Exercises Other Exercises Other Exercises: OT facilitates education re: role of OT. Pt with good understanding and familiarity from previous hospitalizations. In addition, OT educates re: fall prevention while in acute setting and pt with good understanding.   Shoulder Instructions      Home Living Family/patient expects to be discharged to:: Private residence Living Arrangements: Children (dtr and SIL and their two children live with pt) Available Help at Discharge: Family;Available 24 hours/day Type of Home: House Home Access: Stairs to enter [employer] of Steps: 3 Entrance Stairs-Rails: Left Home Layout: One level     Bathroom Shower/Tub: Chief Strategy Officer: Standard     Home Equipment: Environmental consultant - 2 wheels;Cane - quad;Bedside commode;Grab bars - tub/shower;Shower seat          Prior Functioning/Environment Level of Independence: Needs assistance  Gait / Transfers Assistance Needed: MOD I wiht QC ADL's / Homemaking Assistance Needed: Pt's dtr assists with LB dressing/bathing/grooming at baseline, pt's daughter drives and gets groceries. Pt can otherwise perform her self care.            OT Problem List: Decreased strength;Decreased range of motion;Decreased activity tolerance;Impaired balance (sitting and/or standing);Decreased coordination      OT Treatment/Interventions: Self-care/ADL training;Therapeutic exercise;DME and/or AE instruction;Therapeutic activities;Patient/family education;Balance training    OT Goals(Current goals can be found in the care plan section) Acute Rehab OT Goals Patient Stated Goal: to get off this stretcher and either go to a hospital room or home soon OT Goal Formulation: With patient Time For Goal Achievement:  08/15/19 Potential to Achieve Goals: Good ADL Goals Pt Will Transfer to Toilet: with supervision;ambulating;grab bars (to restroom with LRAD) Pt Will Perform Toileting - Clothing Manipulation and hygiene: with supervision;sit to/from stand Pt/caregiver will Perform Home Exercise Program: Increased strength;Both right and left upper extremity;With theraband;With Supervision  OT Frequency: Min 1X/week   Barriers to D/C:            Co-evaluation              AM-PAC OT "6 Clicks" Daily Activity     Outcome Measure Help from another person eating meals?: None Help from another person taking care of personal grooming?: None Help from another person toileting, which includes using toliet, bedpan, or urinal?: A Little Help from another person bathing (including washing, rinsing, drying)?: A Lot Help from another person to put on and taking off regular upper body clothing?: None Help from another person to put on and taking off regular lower body clothing?: A Lot 6 Click Score: 19   End of Session Equipment Utilized During Treatment: Gait belt;Rolling walker Nurse Communication: Mobility status  Activity Tolerance: Patient tolerated treatment well Patient left: Other (comment) (on stretcher in ED room with call bell in reach)  OT Visit Diagnosis: Unsteadiness on feet (R26.81);Muscle weakness (generalized) (  M62.81)                Time: 1624-4695 OT Time Calculation (min): 39 min Charges:  OT General Charges $OT Visit: 1 Visit OT Evaluation $OT Eval Moderate Complexity: 1 Mod OT Treatments $Self Care/Home Management : 8-22 mins $Therapeutic Activity: 8-22 mins  Gerrianne Scale, MS, OTR/L ascom [phone] 08/01/19, 4:53 PM

## 2019-08-01 NOTE — ED Notes (Signed)
RN attempted to call report however 2A charge RN has not assigned a nurse to this patient. 2A Charge RN will call RN back at [phone]

## 2019-08-01 NOTE — ED Notes (Signed)
Pt ambulated w 2 person assistance to toilet and back to bed.

## 2019-08-01 NOTE — H&P (Signed)
History and Physical    Tami Jones WUJ:[id number] DOB: [birth year] DOA: 07/31/2019  Referring MD/NP/PA:   PCP: Karie Schwalbe, MD   Patient coming from:  The patient is coming from home.  At baseline, pt is independent for most of ADL.        Chief Complaint: Weakness, dizziness  HPI: Tami Jones is a 64 y.o. female with medical history significant of stroke with right-sided weakness, hypertension, hyperlipidemia, diabetes mellitus, asthma, depression, anxiety, CKD stage IIIa, who presents with weakness and dizziness.  Pt states that her symptoms started last night, including generalized weakness, dizziness, and mild difficulty swallowing.  No vision loss or hearing loss.  She has history of stroke with right-sided weakness which has not changed significantly.  No left-sided weakness.  Patient does not have chest pain, shortness breath, cough.  No fever or chills.  No nausea, vomiting, diarrhea, abdominal pain, symptoms of UTI.  Patient states that she has some left arm burning sensation.  Patient has little nosebleeding after COVID-19 test sample is collected in ED.  Blood pressure is elevated at 201/101, which improved to 148/70 after giving 10 mg of IV labetalol in ED.   ED Course: pt was found to have WBC 8.6, troponin 21, 39, 43, 43, pending COVID-19 PCR, stable renal function, tachycardia, RR 15, oxygen saturation 94% on room air.  CT head is negative for acute intracranial abnormalities, showed old infarction.  MRI of the brain is negative for acute stroke.  MRA of the head and neck is negative for LVO.  Patient is placed on progressive bed for observation.  MRI HEAD:  1. No acute intracranial abnormality. 2. Chronic microvascular ischemic disease with associated remote lacunar infarcts involving the bilateral basal ganglia, with additional chronic bilateral occipital lobe infarcts.  MRA HEAD: 1. Negative MRA for large vessel occlusion. 2. Extensive atherosclerotic  disease involving the anterior circulation with associated severe bilateral A1 and proximal right A2 stenoses as above. Additional more moderate stenosis at the proximal cavernous right ICA. 3. Severe tandem stenoses involving the distal left V4 segment, with additional severe bilateral SCA, right P1, and right P2 stenoses as above. Left vertebral artery dominant, with the hypoplastic right vertebral artery terminating in PICA.  MRA NECK: 1. No significant flow within the hypoplastic right vertebral artery until the distal V2/V3 segments. Dominant left vertebral artery widely patent within the neck.  2. Wide patency of both carotid artery systems within the neck.   Review of Systems:   General: no fevers, chills, no body weight gain, has fatigue and weakness HEENT: no blurry vision, hearing changes or sore throat Respiratory: no dyspnea, coughing, wheezing CV: no chest pain, no palpitations GI: no nausea, vomiting, abdominal pain, diarrhea, constipation GU: no dysuria, burning on urination, increased urinary frequency, hematuria  Ext: no leg edema Neuro: No vision change or hearing loss. Has right-sided weakness from previous stroke.  Has dizziness and difficulty swallowing Skin: no rash, no skin tear. MSK: No muscle spasm, no deformity, no limitation of range of movement in spin Heme: No easy bruising.  Travel history: No recent long distant travel.  Allergy:  Allergies  Allergen Reactions  . Citalopram Other (See Comments)    Feels odd  . Doxycycline Hyclate Nausea And Vomiting  . Erythromycin Other (See Comments)    Irritated stomach  . Montelukast Sodium Itching  . Nitrofurantoin Nausea And Vomiting  . Sulfa Antibiotics Nausea And Vomiting  . Tramadol Hcl Other (See Comments)  Feels odd  . Venlafaxine Hcl Other (See Comments)    Feels odd  . Amoxicillin-Pot Clavulanate Nausea And Vomiting  . Clarithromycin Rash     See ER note 02/2017    Past Medical History:    Diagnosis Date  . Anxiety   . Asthma   . 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 Thad Ranger  . Diabetes mellitus   . GERD (gastroesophageal reflux disease)   . Hyperlipidemia   . Hypertension     Past Surgical History:  Procedure Laterality Date  . CARDIOVASCULAR STRESS TEST  1/15   myoview. EF 60%  . CESAREAN SECTION    . ESOPHAGOGASTRODUODENOSCOPY  05/2005  . TONSILLECTOMY AND ADENOIDECTOMY      Social History:  reports that she has never smoked. She has never used smokeless tobacco. She reports that she does not drink alcohol and does not use drugs.  Family History:  Family History  Problem Relation Age of Onset  . Coronary artery disease Mother   . Diabetes Mellitus II Mother   . Heart attack Mother 98  . Diabetes Mellitus II Maternal Grandmother      Prior to Admission medications   Medication Sig Start Date End Date Taking? Authorizing Provider  acetaminophen (TYLENOL) 325 MG tablet Take 650 mg by mouth every 6 (six) hours as needed.   Yes [provider]  amLODipine (NORVASC) 5 MG tablet TAKE 1 TABLET(5 MG) BY MOUTH TWICE DAILY Patient taking differently: daily.  07/30/19  Yes Tillman Abide I, MD  aspirin EC 81 MG tablet Take 81 mg by mouth every evening.    Yes [provider]  atorvastatin (LIPITOR) 80 MG tablet Take 1 tablet (80 mg total) by mouth daily. Patient taking differently: Take 80 mg by mouth every evening.  09/21/18 09/21/19 Yes Karie Schwalbe, MD  clopidogrel (PLAVIX) 75 MG tablet Take 1 tablet by mouth every evening.  08/15/18 08/15/19 Yes [provider]  glipiZIDE (GLUCOTROL XL) 5 MG 24 hr tablet TAKE 1 TABLET BY MOUTH EVERY DAY WITH BREAKFAST 07/30/19  Yes Karie Schwalbe, MD  lansoprazole (PREVACID) 30 MG capsule Take 1 capsule (30 mg total) by mouth daily at 12 noon. 08/17/18  Yes Karie Schwalbe, MD  lisinopril-hydrochlorothiazide (ZESTORETIC) 20-25 MG tablet Take 1 tablet by mouth every  morning. 04/02/19  Yes Karie Schwalbe, MD  metoprolol succinate (TOPROL-XL) 25 MG 24 hr tablet Take 12.5 mg by mouth daily. 07/12/19 07/11/20 Yes [provider]    Physical Exam: Vitals:   08/01/19 0700 08/01/19 0815 08/01/19 0900 08/01/19 0930  BP: (!) 148/70 (!) 145/73 (!) 154/77 (!) 160/78  Pulse: 76 79 81 79  Resp: 15 13 16 15   Temp:      TempSrc:      SpO2: 98% 97% 96% 96%  Weight:      Height:       General: Not in acute distress HEENT:       Eyes: PERRL, EOMI, no scleral icterus.       ENT: No discharge from the ears and nose, no pharynx injection, no tonsillar enlargement.        Neck: No JVD, no bruit, no mass felt. Heme: No neck lymph node enlargement. Cardiac: S1/S2, RRR, No murmurs, No gallops or rubs. Respiratory: No rales, wheezing, rhonchi or rubs. GI: Soft, nondistended, nontender, no rebound pain, no organomegaly, BS present. GU: No hematuria Ext: No pitting leg edema bilaterally. 2+DP/PT pulse bilaterally. Musculoskeletal: No  joint deformities, No joint redness or warmth, no limitation of ROM in spin. Skin: No rashes.  Neuro: Alert, oriented X3, cranial nerves II-XII grossly intact. Muscle strength 3/5 in left arm and leg, 5/5 in left extremities.  Sensation to light touch intact. Brachial reflex 2+ bilaterally. Psych: Patient is not psychotic, no suicidal or hemocidal ideation.  Labs on Admission: I have personally reviewed following labs and imaging studies  CBC: Recent Labs  Lab 08/01/19 0052  WBC 8.6  HGB 13.7  HCT 40.0  MCV 91.1  PLT 213   Basic Metabolic Panel: Recent Labs  Lab 08/01/19 0052  NA 139  K 4.1  CL 104  CO2 24  GLUCOSE 227*  BUN 21  CREATININE 1.07*  CALCIUM 9.1   GFR: Estimated Creatinine Clearance: 66.5 mL/min (A) (by C-G formula based on SCr of 1.07 mg/dL (H)). Liver Function Tests: Recent Labs  Lab 08/01/19 0052  AST 24  ALT 29  ALKPHOS 85  BILITOT 0.7  PROT 6.8  ALBUMIN 3.9   No results for  input(s): LIPASE, AMYLASE in the last 168 hours. No results for input(s): AMMONIA in the last 168 hours. Coagulation Profile: No results for input(s): INR, PROTIME in the last 168 hours. Cardiac Enzymes: No results for input(s): CKTOTAL, CKMB, CKMBINDEX, TROPONINI in the last 168 hours. BNP (last 3 results) No results for input(s): PROBNP in the last 8760 hours. HbA1C: No results for input(s): HGBA1C in the last 72 hours. CBG: Recent Labs  Lab 08/01/19 0516 08/01/19 0841  GLUCAP 219* 151*   Lipid Profile: No results for input(s): CHOL, HDL, LDLCALC, TRIG, CHOLHDL, LDLDIRECT in the last 72 hours. Thyroid Function Tests: No results for input(s): TSH, T4TOTAL, FREET4, T3FREE, THYROIDAB in the last 72 hours. Anemia Panel: No results for input(s): VITAMINB12, FOLATE, FERRITIN, TIBC, IRON, RETICCTPCT in the last 72 hours. Urine analysis:    Component Value Date/Time   COLORURINE YELLOW (A) 08/03/2018 1927   APPEARANCEUR HAZY (A) 08/03/2018 1927   LABSPEC 1.014 08/03/2018 1927   PHURINE 6.0 08/03/2018 1927   GLUCOSEU >=500 (A) 08/03/2018 1927   HGBUR NEGATIVE 08/03/2018 1927   HGBUR negative 02/05/2010 1336   BILIRUBINUR Negative 01/15/2019 1508   KETONESUR NEGATIVE 08/03/2018 1927   PROTEINUR Negative 01/15/2019 1508   PROTEINUR NEGATIVE 08/03/2018 1927   UROBILINOGEN 0.2 01/15/2019 1508   UROBILINOGEN 0.2 02/05/2010 1336   NITRITE Negative 01/15/2019 1508   NITRITE NEGATIVE 08/03/2018 1927   LEUKOCYTESUR Negative 01/15/2019 1508   LEUKOCYTESUR TRACE (A) 08/03/2018 1927   Sepsis Labs: @LABRCNTIP (procalcitonin:4,lacticidven:4) ) Recent Results (from the past 240 hour(s))  SARS Coronavirus 2 by RT PCR (hospital order, performed in [facility] hospital lab) Nasopharyngeal Nasopharyngeal Swab     Status: None   Collection Time: 08/01/19  7:22 AM   Specimen: Nasopharyngeal Swab  Result Value Ref Range Status   SARS Coronavirus 2 NEGATIVE NEGATIVE Final    Comment:  (NOTE) SARS-CoV-2 target nucleic acids are NOT DETECTED.  The SARS-CoV-2 RNA is generally detectable in upper and lower respiratory specimens during the acute phase of infection. The lowest concentration of SARS-CoV-2 viral copies this assay can detect is 250 copies / mL. A negative result does not preclude SARS-CoV-2 infection and should not be used as the sole basis for treatment or other patient management decisions.  A negative result may occur with improper specimen collection / handling, submission of specimen other than nasopharyngeal swab, presence of viral mutation(s) within the areas targeted by this assay, and inadequate  number of viral copies (<250 copies / mL). A negative result must be combined with clinical observations, patient history, and epidemiological information.  Fact Sheet for Patients:   BoilerBrush.com.cy  Fact Sheet for Healthcare Providers: https://pope.com/  This test is not yet approved or  cleared by the [place] FDA and has been authorized for detection and/or diagnosis of SARS-CoV-2 by FDA under an Emergency Use Authorization (EUA).  This EUA will remain in effect (meaning this test can be used) for the duration of the COVID-19 declaration under Section 564(b)(1) of the Act, 21 U.S.C. section 360bbb-3(b)(1), unless the authorization is terminated or revoked sooner.  Performed at [facility], [address] [place], [place] [address]      Radiological Exams on Admission: CT Head Wo Contrast  Result Date: 08/01/2019 CLINICAL DATA:  Headache EXAM: CT HEAD WITHOUT CONTRAST TECHNIQUE: Contiguous axial images were obtained from the base of the skull through the vertex without intravenous contrast. COMPARISON:  06/09/2019 FINDINGS: Brain: Bilateral old occipital infarcts. Old bilateral basal ganglia lacunar infarcts. No acute intracranial abnormality. Specifically, no hemorrhage,  hydrocephalus, mass lesion, acute infarction, or significant intracranial injury. Vascular: No hyperdense vessel or unexpected calcification. Skull: No acute calvarial abnormality. Sinuses/Orbits: Visualized paranasal sinuses and mastoids clear. Orbital soft tissues unremarkable. Other: None IMPRESSION: Old infarcts as above.  No acute intracranial abnormality. Electronically Signed   By: Charlett Nose M.D.   On: 08/01/2019 00:17   MR ANGIO HEAD WO CONTRAST  Result Date: 08/01/2019 CLINICAL DATA:  Initial evaluation for acute headache EXAM: MRI HEAD WITHOUT CONTRAST MRA HEAD WITHOUT CONTRAST MRA NECK WITHOUT AND WITH CONTRAST TECHNIQUE: Multiplanar, multiecho pulse sequences of the brain and surrounding structures were obtained without intravenous contrast. Angiographic images of the Circle of Willis were obtained using MRA technique without intravenous contrast. Angiographic images of the neck were obtained using MRA technique without and with intravenous contrast. Carotid stenosis measurements (when applicable) are obtained utilizing NASCET criteria, using the distal internal carotid diameter as the denominator. CONTRAST:  10 cc of Gadavist. COMPARISON:  Comparison made with prior head CT from earlier the same day as well as previous brain MRI from 06/09/2019. FINDINGS: MRI HEAD FINDINGS Brain: Generalized age-related cerebral atrophy. Patchy T2/FLAIR hyperintensity within the periventricular and deep white matter both cerebral hemispheres most consistent with chronic small vessel ischemic disease. Mild patchy involvement of the pons. Superimposed remote lacunar infarcts seen involving both basal ganglia. Encephalomalacia and gliosis involving the bilateral occipital lobes consistent with chronic infarctions as well. No abnormal foci of restricted diffusion to suggest acute or subacute ischemia. Gray-white matter differentiation maintained. No areas of chronic cortical infarction elsewhere within the brain. No  evidence for acute or chronic intracranial hemorrhage. No mass lesion, midline shift or mass effect. No hydrocephalus or extra-axial fluid collection. Pituitary gland suprasellar region normal. Midline structures intact. Vascular: Major intracranial vascular flow voids are maintained. Skull and upper cervical spine: Craniocervical junction within normal limits. Bone marrow signal intensity normal. No scalp soft tissue abnormality. Sinuses/Orbits: Globes and orbital soft tissues within normal limits. Paranasal sinuses are clear. No significant mastoid effusion. Inner ear structures grossly normal. Other: None. MRA HEAD FINDINGS ANTERIOR CIRCULATION: Visualized distal cervical segments of the internal carotid arteries are patent with symmetric antegrade flow. Petrous segments widely patent bilaterally. Scattered atheromatous irregularity throughout the cavernous/supraclinoid ICAs bilaterally, right greater than left. No significant stenosis seen on the left. Short-segment moderate stenosis noted at the proximal cavernous right ICA (series 1084, image 3). Short-segment fenestration  noted at the supraclinoid left ICA. Short-segment severe atheromatous stenoses noted at the origins of both A1 segments (series 1084, image 10). Normal anterior communicating artery complex. Short-segment moderate proximal right A2 stenosis (series 1084, image 13). ACAs otherwise patent distally without flow-limiting stenosis. M1 segments patent bilaterally. Normal MCA bifurcations. Distal MCA branches well perfused and symmetric. Diffuse small vessel atheromatous irregularity seen throughout the anterior circulation. POSTERIOR CIRCULATION: Vertebral arteries patent as they course into the cranial vault. Right vertebral artery hypoplastic and terminates in PICA. Severe tandem segmental stenoses noted involving the distal left V4 segment (series 1009, image 12). Both picas patent. Basilar diffusely irregular but remains patent without  high-grade stenosis. Severe stenosis at the origin of the left SCA (series 1009, image 11). Moderate to severe stenoses noted involving the proximal and mid right SCA. Both PCAs primarily supplied via the basilar. Severe proximal right P1 stenosis, with additional severe short-segment right P2 stenosis (series 1003, image 12). Diffuse atheromatous irregularity throughout the remainder the PCAs without high-grade stenosis. No intracranial aneurysm. MRA NECK FINDINGS AORTIC ARCH: Examination technically limited by motion artifact and timing of the contrast bolus. Visualized aortic arch of normal caliber with normal branch pattern. No appreciable flow-limiting stenosis about the origin of the great vessels. RIGHT CAROTID SYSTEM: Antegrade and widely patent flow seen within the right common and internal carotid arteries to the skull base. No appreciable stenosis seen about the right carotid bifurcation. Right ICA partially medialized into the retropharyngeal space. LEFT CAROTID SYSTEM: Antegrade and widely patent flow seen within the left common and internal carotid arteries to the skull base. No appreciable significant stenosis about the left bifurcation. Left ICA partially medialized into the retropharyngeal space. VERTEBRAL ARTERIES: Dominant left vertebral artery demonstrates widely patent antegrade flow. Hypoplastic right vertebral artery largely occluded proximally, with no significant flow seen until the distal V2/V3 segments. Right vertebral artery is patent as it courses into the cranial vault. IMPRESSION: MRI HEAD IMPRESSION: 1. No acute intracranial abnormality. 2. Chronic microvascular ischemic disease with associated remote lacunar infarcts involving the bilateral basal ganglia, with additional chronic bilateral occipital lobe infarcts. MRA HEAD IMPRESSION: 1. Negative MRA for large vessel occlusion. 2. Extensive atherosclerotic disease involving the anterior circulation with associated severe bilateral A1  and proximal right A2 stenoses as above. Additional more moderate stenosis at the proximal cavernous right ICA. 3. Severe tandem stenoses involving the distal left V4 segment, with additional severe bilateral SCA, right P1, and right P2 stenoses as above. Left vertebral artery dominant, with the hypoplastic right vertebral artery terminating in PICA. MRA NECK IMPRESSION: 1. No significant flow within the hypoplastic right vertebral artery until the distal V2/V3 segments. Dominant left vertebral artery widely patent within the neck. 2. Wide patency of both carotid artery systems within the neck. Electronically Signed   By: Rise Mu M.D.   On: 08/01/2019 03:24   MR Angiogram Neck W or Wo Contrast  Result Date: 08/01/2019 CLINICAL DATA:  Initial evaluation for acute headache EXAM: MRI HEAD WITHOUT CONTRAST MRA HEAD WITHOUT CONTRAST MRA NECK WITHOUT AND WITH CONTRAST TECHNIQUE: Multiplanar, multiecho pulse sequences of the brain and surrounding structures were obtained without intravenous contrast. Angiographic images of the Circle of Willis were obtained using MRA technique without intravenous contrast. Angiographic images of the neck were obtained using MRA technique without and with intravenous contrast. Carotid stenosis measurements (when applicable) are obtained utilizing NASCET criteria, using the distal internal carotid diameter as the denominator. CONTRAST:  10 cc of Gadavist. COMPARISON:  Comparison made with prior head CT from earlier the same day as well as previous brain MRI from 06/09/2019. FINDINGS: MRI HEAD FINDINGS Brain: Generalized age-related cerebral atrophy. Patchy T2/FLAIR hyperintensity within the periventricular and deep white matter both cerebral hemispheres most consistent with chronic small vessel ischemic disease. Mild patchy involvement of the pons. Superimposed remote lacunar infarcts seen involving both basal ganglia. Encephalomalacia and gliosis involving the bilateral  occipital lobes consistent with chronic infarctions as well. No abnormal foci of restricted diffusion to suggest acute or subacute ischemia. Gray-white matter differentiation maintained. No areas of chronic cortical infarction elsewhere within the brain. No evidence for acute or chronic intracranial hemorrhage. No mass lesion, midline shift or mass effect. No hydrocephalus or extra-axial fluid collection. Pituitary gland suprasellar region normal. Midline structures intact. Vascular: Major intracranial vascular flow voids are maintained. Skull and upper cervical spine: Craniocervical junction within normal limits. Bone marrow signal intensity normal. No scalp soft tissue abnormality. Sinuses/Orbits: Globes and orbital soft tissues within normal limits. Paranasal sinuses are clear. No significant mastoid effusion. Inner ear structures grossly normal. Other: None. MRA HEAD FINDINGS ANTERIOR CIRCULATION: Visualized distal cervical segments of the internal carotid arteries are patent with symmetric antegrade flow. Petrous segments widely patent bilaterally. Scattered atheromatous irregularity throughout the cavernous/supraclinoid ICAs bilaterally, right greater than left. No significant stenosis seen on the left. Short-segment moderate stenosis noted at the proximal cavernous right ICA (series 1084, image 3). Short-segment fenestration noted at the supraclinoid left ICA. Short-segment severe atheromatous stenoses noted at the origins of both A1 segments (series 1084, image 10). Normal anterior communicating artery complex. Short-segment moderate proximal right A2 stenosis (series 1084, image 13). ACAs otherwise patent distally without flow-limiting stenosis. M1 segments patent bilaterally. Normal MCA bifurcations. Distal MCA branches well perfused and symmetric. Diffuse small vessel atheromatous irregularity seen throughout the anterior circulation. POSTERIOR CIRCULATION: Vertebral arteries patent as they course into  the cranial vault. Right vertebral artery hypoplastic and terminates in PICA. Severe tandem segmental stenoses noted involving the distal left V4 segment (series 1009, image 12). Both picas patent. Basilar diffusely irregular but remains patent without high-grade stenosis. Severe stenosis at the origin of the left SCA (series 1009, image 11). Moderate to severe stenoses noted involving the proximal and mid right SCA. Both PCAs primarily supplied via the basilar. Severe proximal right P1 stenosis, with additional severe short-segment right P2 stenosis (series 1003, image 12). Diffuse atheromatous irregularity throughout the remainder the PCAs without high-grade stenosis. No intracranial aneurysm. MRA NECK FINDINGS AORTIC ARCH: Examination technically limited by motion artifact and timing of the contrast bolus. Visualized aortic arch of normal caliber with normal branch pattern. No appreciable flow-limiting stenosis about the origin of the great vessels. RIGHT CAROTID SYSTEM: Antegrade and widely patent flow seen within the right common and internal carotid arteries to the skull base. No appreciable stenosis seen about the right carotid bifurcation. Right ICA partially medialized into the retropharyngeal space. LEFT CAROTID SYSTEM: Antegrade and widely patent flow seen within the left common and internal carotid arteries to the skull base. No appreciable significant stenosis about the left bifurcation. Left ICA partially medialized into the retropharyngeal space. VERTEBRAL ARTERIES: Dominant left vertebral artery demonstrates widely patent antegrade flow. Hypoplastic right vertebral artery largely occluded proximally, with no significant flow seen until the distal V2/V3 segments. Right vertebral artery is patent as it courses into the cranial vault. IMPRESSION: MRI HEAD IMPRESSION: 1. No acute intracranial abnormality. 2. Chronic microvascular ischemic disease with associated remote lacunar infarcts involving the  bilateral basal ganglia, with additional chronic bilateral occipital lobe infarcts. MRA HEAD IMPRESSION: 1. Negative MRA for large vessel occlusion. 2. Extensive atherosclerotic disease involving the anterior circulation with associated severe bilateral A1 and proximal right A2 stenoses as above. Additional more moderate stenosis at the proximal cavernous right ICA. 3. Severe tandem stenoses involving the distal left V4 segment, with additional severe bilateral SCA, right P1, and right P2 stenoses as above. Left vertebral artery dominant, with the hypoplastic right vertebral artery terminating in PICA. MRA NECK IMPRESSION: 1. No significant flow within the hypoplastic right vertebral artery until the distal V2/V3 segments. Dominant left vertebral artery widely patent within the neck. 2. Wide patency of both carotid artery systems within the neck. Electronically Signed   By: Rise Mu M.D.   On: 08/01/2019 03:24   MR BRAIN WO CONTRAST  Result Date: 08/01/2019 CLINICAL DATA:  Initial evaluation for acute headache EXAM: MRI HEAD WITHOUT CONTRAST MRA HEAD WITHOUT CONTRAST MRA NECK WITHOUT AND WITH CONTRAST TECHNIQUE: Multiplanar, multiecho pulse sequences of the brain and surrounding structures were obtained without intravenous contrast. Angiographic images of the Circle of Willis were obtained using MRA technique without intravenous contrast. Angiographic images of the neck were obtained using MRA technique without and with intravenous contrast. Carotid stenosis measurements (when applicable) are obtained utilizing NASCET criteria, using the distal internal carotid diameter as the denominator. CONTRAST:  10 cc of Gadavist. COMPARISON:  Comparison made with prior head CT from earlier the same day as well as previous brain MRI from 06/09/2019. FINDINGS: MRI HEAD FINDINGS Brain: Generalized age-related cerebral atrophy. Patchy T2/FLAIR hyperintensity within the periventricular and deep white matter both  cerebral hemispheres most consistent with chronic small vessel ischemic disease. Mild patchy involvement of the pons. Superimposed remote lacunar infarcts seen involving both basal ganglia. Encephalomalacia and gliosis involving the bilateral occipital lobes consistent with chronic infarctions as well. No abnormal foci of restricted diffusion to suggest acute or subacute ischemia. Gray-white matter differentiation maintained. No areas of chronic cortical infarction elsewhere within the brain. No evidence for acute or chronic intracranial hemorrhage. No mass lesion, midline shift or mass effect. No hydrocephalus or extra-axial fluid collection. Pituitary gland suprasellar region normal. Midline structures intact. Vascular: Major intracranial vascular flow voids are maintained. Skull and upper cervical spine: Craniocervical junction within normal limits. Bone marrow signal intensity normal. No scalp soft tissue abnormality. Sinuses/Orbits: Globes and orbital soft tissues within normal limits. Paranasal sinuses are clear. No significant mastoid effusion. Inner ear structures grossly normal. Other: None. MRA HEAD FINDINGS ANTERIOR CIRCULATION: Visualized distal cervical segments of the internal carotid arteries are patent with symmetric antegrade flow. Petrous segments widely patent bilaterally. Scattered atheromatous irregularity throughout the cavernous/supraclinoid ICAs bilaterally, right greater than left. No significant stenosis seen on the left. Short-segment moderate stenosis noted at the proximal cavernous right ICA (series 1084, image 3). Short-segment fenestration noted at the supraclinoid left ICA. Short-segment severe atheromatous stenoses noted at the origins of both A1 segments (series 1084, image 10). Normal anterior communicating artery complex. Short-segment moderate proximal right A2 stenosis (series 1084, image 13). ACAs otherwise patent distally without flow-limiting stenosis. M1 segments patent  bilaterally. Normal MCA bifurcations. Distal MCA branches well perfused and symmetric. Diffuse small vessel atheromatous irregularity seen throughout the anterior circulation. POSTERIOR CIRCULATION: Vertebral arteries patent as they course into the cranial vault. Right vertebral artery hypoplastic and terminates in PICA. Severe tandem segmental stenoses noted involving the distal left V4 segment (series 1009, image 12). Both picas patent. Basilar diffusely  irregular but remains patent without high-grade stenosis. Severe stenosis at the origin of the left SCA (series 1009, image 11). Moderate to severe stenoses noted involving the proximal and mid right SCA. Both PCAs primarily supplied via the basilar. Severe proximal right P1 stenosis, with additional severe short-segment right P2 stenosis (series 1003, image 12). Diffuse atheromatous irregularity throughout the remainder the PCAs without high-grade stenosis. No intracranial aneurysm. MRA NECK FINDINGS AORTIC ARCH: Examination technically limited by motion artifact and timing of the contrast bolus. Visualized aortic arch of normal caliber with normal branch pattern. No appreciable flow-limiting stenosis about the origin of the great vessels. RIGHT CAROTID SYSTEM: Antegrade and widely patent flow seen within the right common and internal carotid arteries to the skull base. No appreciable stenosis seen about the right carotid bifurcation. Right ICA partially medialized into the retropharyngeal space. LEFT CAROTID SYSTEM: Antegrade and widely patent flow seen within the left common and internal carotid arteries to the skull base. No appreciable significant stenosis about the left bifurcation. Left ICA partially medialized into the retropharyngeal space. VERTEBRAL ARTERIES: Dominant left vertebral artery demonstrates widely patent antegrade flow. Hypoplastic right vertebral artery largely occluded proximally, with no significant flow seen until the distal V2/V3  segments. Right vertebral artery is patent as it courses into the cranial vault. IMPRESSION: MRI HEAD IMPRESSION: 1. No acute intracranial abnormality. 2. Chronic microvascular ischemic disease with associated remote lacunar infarcts involving the bilateral basal ganglia, with additional chronic bilateral occipital lobe infarcts. MRA HEAD IMPRESSION: 1. Negative MRA for large vessel occlusion. 2. Extensive atherosclerotic disease involving the anterior circulation with associated severe bilateral A1 and proximal right A2 stenoses as above. Additional more moderate stenosis at the proximal cavernous right ICA. 3. Severe tandem stenoses involving the distal left V4 segment, with additional severe bilateral SCA, right P1, and right P2 stenoses as above. Left vertebral artery dominant, with the hypoplastic right vertebral artery terminating in PICA. MRA NECK IMPRESSION: 1. No significant flow within the hypoplastic right vertebral artery until the distal V2/V3 segments. Dominant left vertebral artery widely patent within the neck. 2. Wide patency of both carotid artery systems within the neck. Electronically Signed   By: Rise Mu M.D.   On: 08/01/2019 03:24     EKG: Independently reviewed.  Sinus rhythm, QTC 479, LAE, mild ST depression in inferior leads, early R wave progression  Assessment/Plan Principal Problem:   Hypertensive emergency Active Problems:   GERD   Hypertension   HLD (hyperlipidemia)   Type II diabetes mellitus with renal manifestations (HCC)   CKD (chronic kidney disease), stage IIIa   Stroke (HCC)   Elevated troponin   Hypertensive emergency and HTN: Bp 201/101 -->73532 after given 10mg  of IV labetalol in ED.  -will place progressive bed for obs -continue home: Amlodipine, Zestoretic, metoprolol -IV hydralazine. -PT/OT  Hx of Stroke [facility]): has right-sided weakness from previous stroke.  Patient reports difficulty swallowing, dizziness, and weakness. CT head is  negative for acute intracranial abnormalities, but showed old infarction.  MRI of brain is negative for acute stroke.  MRA of the head and neck negative for LVO. -continue aspirin, Plavix and lipitor  GERD -Protonix  HLD (hyperlipidemia) -Lipitor  Type II diabetes mellitus with renal manifestations (HCC): Most recent A1c 8.3, poorly controled. Patient is taking glipizide at home -SSI  CKD (chronic kidney disease), stage IIIa: stable. Cre 1.07 and BUN 21 -f/u by BMP  Elevated trop: Troponin is slightly elevated, which has plateaued. Troponin 21, 39, 43, 43. No  CP.  Most likely due to demand ischemia secondary to hypertensive emergency. - Repeat EKG in the am  - prn Nitroglycerin,  - on aspirin, lipitor  - Risk factor stratification: will check FLP and A1C     DVT ppx: SCD Code Status: Full code Family Communication: not done, no family member is at bed side.  Disposition Plan:  Anticipate discharge back to previous environment Consults called:  none Admission status:   progressive unit for obs       Status is: Observation  The patient remains OBS appropriate and will d/c before 2 midnights.  Dispo: The patient is from: Home              Anticipated d/c is to: Home              Anticipated d/c date is: 1 day              Patient currently is not medically stable to d/c.          Date of Service 08/01/2019    Lorretta HarpXilin Melaine Mcphee [facility]   If 7PM-7AM, please contact night-coverage [email or web address] 08/01/2019, 9:55 AM

## 2019-08-02 ENCOUNTER — Telehealth: Payer: Self-pay | Admitting: Internal Medicine

## 2019-08-02 DIAGNOSIS — I161 Hypertensive emergency: Secondary | ICD-10-CM

## 2019-08-02 DIAGNOSIS — N1831 Chronic kidney disease, stage 3a: Secondary | ICD-10-CM | POA: Diagnosis not present

## 2019-08-02 DIAGNOSIS — R778 Other specified abnormalities of plasma proteins: Secondary | ICD-10-CM | POA: Diagnosis not present

## 2019-08-02 LAB — CBC
HCT: 38.6 % (ref 36.0–46.0)
Hemoglobin: 13.4 g/dL (ref 12.0–15.0)
MCH: 31.5 pg (ref 26.0–34.0)
MCHC: 34.7 g/dL (ref 30.0–36.0)
MCV: 90.6 fL (ref 80.0–100.0)
Platelets: 231 10*3/uL (ref 150–400)
RBC: 4.26 MIL/uL (ref 3.87–5.11)
RDW: 12.7 % (ref 11.5–15.5)
WBC: 5.5 10*3/uL (ref 4.0–10.5)
nRBC: 0 % (ref 0.0–0.2)

## 2019-08-02 LAB — BASIC METABOLIC PANEL
Anion gap: 10 (ref 5–15)
BUN: 23 mg/dL (ref 8–23)
CO2: 25 mmol/L (ref 22–32)
Calcium: 9.1 mg/dL (ref 8.9–10.3)
Chloride: 104 mmol/L (ref 98–111)
Creatinine, Ser: 1.17 mg/dL — ABNORMAL HIGH (ref 0.44–1.00)
GFR calc Af Amer: 58 mL/min — ABNORMAL LOW (ref >60)
GFR calc non Af Amer: 50 mL/min — ABNORMAL LOW (ref >60)
Glucose, Bld: 188 mg/dL — ABNORMAL HIGH (ref 70–99)
Potassium: 4.5 mmol/L (ref 3.5–5.1)
Sodium: 139 mmol/L (ref 135–145)

## 2019-08-02 LAB — GLUCOSE, CAPILLARY
Glucose-Capillary: 172 mg/dL — ABNORMAL HIGH (ref 70–99)
Glucose-Capillary: 195 mg/dL — ABNORMAL HIGH (ref 70–99)

## 2019-08-02 LAB — LIPID PANEL
Cholesterol: 172 mg/dL (ref 0–200)
HDL: 33 mg/dL — ABNORMAL LOW (ref >40)
LDL Cholesterol: 114 mg/dL — ABNORMAL HIGH (ref 0–99)
Total CHOL/HDL Ratio: 5.2 RATIO
Triglycerides: 125 mg/dL (ref <150)
VLDL: 25 mg/dL (ref 0–40)

## 2019-08-02 MED ORDER — AMLODIPINE BESYLATE 5 MG PO TABS: 10.0000 mg | ORAL_TABLET | Freq: Every day | ORAL | 0 refills | Status: DC

## 2019-08-02 NOTE — Telephone Encounter (Signed)
Perfect thanks

## 2019-08-02 NOTE — Telephone Encounter (Signed)
I can see her on Wednesday. See if you can push up my 12 appt to 11:45 and put her in after that ---at 11:45. If not, can just add her on at 12:30 and block the 11:45

## 2019-08-02 NOTE — Telephone Encounter (Signed)
[facility] called today to schedule Hospital follow up   They stated they needed her to follow up with [place] in 1 week.   Is there somewhere we could work her in ?  Thanks!

## 2019-08-02 NOTE — Telephone Encounter (Signed)
I spoke to the 12:00 and she moved her appointment to 11:45.  I scheduled patient with Dr. Alphonsus Sias at 12:15.

## 2019-08-02 NOTE — Evaluation (Signed)
Physical Therapy Evaluation Patient Details Name: Tami Jones [facility] MRN: [id number] DOB: 05-May-1955 Today's Date: 08/02/2019   History of Present Illness  64 y/o F with PMH: CKD3, stroke with R sided weakness, HTN, HLD, DM, asthma, depression, and anxiety. Presented d/t feeling weak/dizzy. MRI of head negative for acute abnormality, MRA of head negative for large vessel occlusion. Pt adm with hypertensive emergency with SBPs on adm reaching 200s. Pt with more stable BPs at time of assessment.  Clinical Impression  Pt eager to work with PT and showed good effort, safety and confidence with mobility and ambulation. She had slow but steady cadence, and needed but was not heavily reliant on the QC during the effort.  She continues to have R sided weakness from CVA that is not different from her recent baseline.  Pt feels confident about being able to return home and will have supervision/assistance when she does.  Pt will benefit from continued PT here in the hospital but she feels confident that she will be able to manage and continue with some of what HHPT had been doing with her on her own w/o PT f/u at d/c.    Follow Up Recommendations No PT follow up (has maintained daily walking routine since HHPT ended)    Equipment Recommendations  None recommended by PT    Recommendations for Other Services       Precautions / Restrictions Precautions Precautions: Fall Restrictions Weight Bearing Restrictions: No      Mobility  Bed Mobility Overal bed mobility: Modified Independent                Transfers Overall transfer level: Modified independent Equipment used: Rolling walker (2 wheeled) Transfers: Sit to/from Stand           General transfer comment: Pt was able to rise w/o phyiscal assist, minimal cuing for set up and sequencing.  Ambulation/Gait Ambulation/Gait assistance: Min guard Gait Distance (Feet): 65 Feet Assistive device: Rolling walker (2 wheeled)        General Gait Details: Pt was able to ambulate with slow but consistent and safe cadence.  She was not heavily reliant on the QC and maintained appropriate vitals t/o the effort (O2 actually rose from low to high 90s on room air, HR stable 60-70s)  Stairs            Wheelchair Mobility    Modified Rankin (Stroke Patients Only)       Balance Overall balance assessment: Needs assistance   Sitting balance-Leahy Scale: Good     Standing balance support: Single extremity supported (QC) Standing balance-Leahy Scale: Good                               Pertinent Vitals/Pain Pain Assessment: No/denies pain    Home Living Family/patient expects to be discharged to:: Private residence Living Arrangements: Children Available Help at Discharge: Family;Available 24 hours/day Type of Home: House Home Access: Stairs to enter Entrance Stairs-Rails: Left Entrance Stairs-Number of Steps: 3 Home Layout: One level Home Equipment: Walker - 2 wheels;Cane - quad;Bedside commode;Grab bars - tub/shower;Shower seat      Prior Function Level of Independence: Needs assistance   Gait / Transfers Assistance Needed: MOD I with QC, does not drive but is able to do light yard work and be active in the home  ADL's / [employer] Assistance Needed: Pt's dtr assists with LB dressing/bathing/grooming at baseline, pt's daughter drives and gets groceries.  Pt can otherwise perform her self care.        Hand Dominance        Extremity/Trunk Assessment   Upper Extremity Assessment RUE Deficits / Details: shld flexion to 1/2 arc of motion, elbow and grip MMT 3+/5 LUE Deficits / Details: Shld flexion ROM to 3/4 arc of motion, elbow and grip MMT 4-/5    Lower Extremity Assessment Lower Extremity Assessment: RLE deficits/detail RLE Deficits / Details: R LE grossly 3+ to 4-/5       Communication   Communication: No difficulties  Cognition Arousal/Alertness: Awake/alert Behavior  During Therapy: WFL for tasks assessed/performed Overall Cognitive Status: Within Functional Limits for tasks assessed                                        General Comments      Exercises     Assessment/Plan    PT Assessment Patient needs continued PT services  PT Problem List Decreased strength;Decreased range of motion;Decreased activity tolerance;Decreased balance;Decreased mobility;Decreased coordination;Decreased knowledge of use of DME;Decreased safety awareness       PT Treatment Interventions DME instruction;Gait training;Stair training;Functional mobility training;Therapeutic activities;Therapeutic exercise;Balance training;Neuromuscular re-education;Patient/family education    PT Goals (Current goals can be found in the Care Plan section)  Acute Rehab PT Goals Patient Stated Goal: go home PT Goal Formulation: With patient Time For Goal Achievement: 08/16/19 Potential to Achieve Goals: Good    Frequency Min 2X/week   Barriers to discharge        Co-evaluation               AM-PAC PT "6 Clicks" Mobility  Outcome Measure Help needed turning from your back to your side while in a flat bed without using bedrails?: None Help needed moving from lying on your back to sitting on the side of a flat bed without using bedrails?: None Help needed moving to and from a bed to a chair (including a wheelchair)?: A Little Help needed standing up from a chair using your arms (e.g., wheelchair or bedside chair)?: A Little Help needed to walk in hospital room?: A Little Help needed climbing 3-5 steps with a railing? : A Little 6 Click Score: 20    End of Session Equipment Utilized During Treatment: Gait belt Activity Tolerance: Patient tolerated treatment well Patient left: with chair alarm set;with call bell/phone within reach Nurse Communication: Mobility status PT Visit Diagnosis: Muscle weakness (generalized) (M62.81);Difficulty in walking, not  elsewhere classified (R26.2)    Time: 8469-6295 PT Time Calculation (min) (ACUTE ONLY): 25 min   Charges:   PT Evaluation $PT Eval Low Complexity: 1 Low PT Treatments $Gait Training: 8-22 mins        Kreg Shropshire, DPT 08/02/2019, 10:05 AM

## 2019-08-02 NOTE — Progress Notes (Signed)
Inpatient Diabetes Program Recommendations  AACE/ADA: New Consensus Statement on Inpatient Glycemic Control (2015)  Target Ranges:  Prepandial:   less than 140 mg/dL      Peak postprandial:   less than 180 mg/dL (1-2 hours)      Critically ill patients:  140 - 180 mg/dL   Lab Results  Component Value Date   GLUCAP 172 (H) 08/02/2019   HGBA1C 8.3 (H) 06/10/2019    Review of Glycemic Control Results for Tami Jones, Tami Jones (MRN [id number]) as of 08/02/2019 09:26  Ref. Range 08/01/2019 08:41 08/01/2019 11:48 08/01/2019 17:03 08/01/2019 21:04 08/02/2019 08:00  Glucose-Capillary Latest Ref Range: 70 - 99 mg/dL 443 (H)  Novolog 2 units 174 (H)  Novolog 2 units 244 (H)  Novolog 3 units 267 (H)  Novolog 3 units 172 (H)  Novolog 2 units   HTN emergency Diabetes history: DM 2 Outpatient Diabetes medications: Glipizide 5 mg Daily Current orders for Inpatient glycemic control:  Novolog 0-9 units tid + hs  A1c 8.3% on 4/25  Inpatient Diabetes Program Recommendations:    Glucose trends elevated especially after meal intake.  -  Consider increasing Novolog scale to "moderate" 0-15 units tid.  May also need Novolog 2 units meal coverage added if eating >50% of meals.  Thanks, Christena Deem RN, MSN, BC-ADM Inpatient Diabetes Coordinator Team Pager [phone] (8a-5p)

## 2019-08-02 NOTE — Discharge Summary (Signed)
Physician Discharge Summary  Patient ID: Tami Jones MRN: [id number] DOB/AGE: [birth year] 64 y.o.  Admit date: 07/31/2019 Discharge date: 08/02/2019  Admission Diagnoses:  Discharge Diagnoses:  Principal Problem:   Hypertensive emergency Active Problems:   GERD   Hypertension   HLD (hyperlipidemia)   Type II diabetes mellitus with renal manifestations ([place])   CKD (chronic kidney disease), stage IIIa   Stroke (HCC)   Elevated troponin   Discharged Condition: good  Hospital Course:  Tami Jones is a 64 y.o. female with medical history significant of stroke with right-sided weakness, hypertension, hyperlipidemia, diabetes mellitus, asthma, depression, anxiety, CKD stage IIIa, who presents with weakness and dizziness.  Pt states that her symptoms started last night, including generalized weakness, dizziness, and mild difficulty swallowing.  No vision loss or hearing loss.  She has history of stroke with right-sided weakness which has not changed significantly.  No left-sided weakness.  Patient does not have chest pain, shortness breath, cough.  No fever or chills.  No nausea, vomiting, diarrhea, abdominal pain, symptoms of UTI.  Patient states that she has some left arm burning sensation.  Patient has little nosebleeding after COVID-19 test sample is collected in ED.  Blood pressure is elevated at 201/101, which improved to 148/70 after giving 10 mg of IV labetalol in ED.   ED Course: pt was found to have WBC 8.6, troponin 21, 39, 43, 43, pending COVID-19 PCR, stable renal function, tachycardia, RR 15, oxygen saturation 94% on room air.  CT head is negative for acute intracranial abnormalities, showed old infarction.  MRI of the brain is negative for acute stroke.  MRA of the head and neck is negative for LVO.  Hospital course. Patient was monitored overnight.  He was restarted on home medicines, her blood pressure is gradually getting better.  I had a long discussion with the  patient, patient normally does not sleep well at night.  She is getting worse for the last 5 days, she could not sleep at all.  She does not snore or has any apnea.  Her blood pressure has been running high for the last 5 days.  She also feels depressed this morning.  At this point, due to her worsening blood pressure for the last 5 days, I will increase Norvasc to 10 mg daily.  She is also recommended to start melatonin supplement at 5 mg every evening, she need to talk to the family doctor in the near future to see if this is effective.  If not, recommend trazodone.  Patient may also be referred to psychiatrist for insomnia and depression. Patient also has mild elevation of high-sensitive troponin peak at 43.  She does not have any chest pain.  Echocardiogram showed normal ejection fraction.  This is secondary to hypertension emergency.  She can be followed with her family doctor in the near future.  Patient condition so far had improved.  She is medically stable to be discharged.   Consults: None  Significant Diagnostic Studies:  Echo: 1. Left ventricular ejection fraction, by estimation, is 60 to 65%. The left ventricle has normal function. The left ventricle has no regional wall motion abnormalities. Left ventricular diastolic parameters are indeterminate. 2. Right ventricular systolic function is normal. The right ventricular size is normal. 3. The mitral valve is normal in structure. Trivial mitral valve regurgitation. No evidence of mitral stenosis. 4. The aortic valve is normal in structure. Aortic valve regurgitation is not visualized. No aortic stenosis is present. 5. The inferior  vena cava is normal in size with greater than 50% respiratory variability, suggesting right atrial pressure of 3 mmHg.  MRI HEAD WITHOUT CONTRAST  MRA HEAD WITHOUT CONTRAST  MRA NECK WITHOUT AND WITH CONTRAST  TECHNIQUE: Multiplanar, multiecho pulse sequences of the brain and  surrounding structures were obtained without intravenous contrast. Angiographic images of the Circle of Willis were obtained using MRA technique without intravenous contrast. Angiographic images of the neck were obtained using MRA technique without and with intravenous contrast. Carotid stenosis measurements (when applicable) are obtained utilizing NASCET criteria, using the distal internal carotid diameter as the denominator.  CONTRAST:  10 cc of Gadavist.  COMPARISON:  Comparison made with prior head CT from earlier the same day as well as previous brain MRI from 06/09/2019.  FINDINGS: MRI HEAD FINDINGS  Brain: Generalized age-related cerebral atrophy. Patchy T2/FLAIR hyperintensity within the periventricular and deep white matter both cerebral hemispheres most consistent with chronic small vessel ischemic disease. Mild patchy involvement of the pons. Superimposed remote lacunar infarcts seen involving both basal ganglia. Encephalomalacia and gliosis involving the bilateral occipital lobes consistent with chronic infarctions as well.  No abnormal foci of restricted diffusion to suggest acute or subacute ischemia. Gray-white matter differentiation maintained. No areas of chronic cortical infarction elsewhere within the brain. No evidence for acute or chronic intracranial hemorrhage.  No mass lesion, midline shift or mass effect. No hydrocephalus or extra-axial fluid collection. Pituitary gland suprasellar region normal. Midline structures intact.  Vascular: Major intracranial vascular flow voids are maintained.  Skull and upper cervical spine: Craniocervical junction within normal limits. Bone marrow signal intensity normal. No scalp soft tissue abnormality.  Sinuses/Orbits: Globes and orbital soft tissues within normal limits. Paranasal sinuses are clear. No significant mastoid effusion. Inner ear structures grossly normal.  Other: None.  MRA HEAD  FINDINGS  ANTERIOR CIRCULATION:  Visualized distal cervical segments of the internal carotid arteries are patent with symmetric antegrade flow. Petrous segments widely patent bilaterally. Scattered atheromatous irregularity throughout the cavernous/supraclinoid ICAs bilaterally, right greater than left. No significant stenosis seen on the left. Short-segment moderate stenosis noted at the proximal cavernous right ICA (series 1084, image 3). Short-segment fenestration noted at the supraclinoid left ICA.  Short-segment severe atheromatous stenoses noted at the origins of both A1 segments (series 1084, image 10). Normal anterior communicating artery complex. Short-segment moderate proximal right A2 stenosis (series 1084, image 13). ACAs otherwise patent distally without flow-limiting stenosis.  M1 segments patent bilaterally. Normal MCA bifurcations. Distal MCA branches well perfused and symmetric. Diffuse small vessel atheromatous irregularity seen throughout the anterior circulation.  POSTERIOR CIRCULATION:  Vertebral arteries patent as they course into the cranial vault. Right vertebral artery hypoplastic and terminates in PICA. Severe tandem segmental stenoses noted involving the distal left V4 segment (series 1009, image 12). Both picas patent. Basilar diffusely irregular but remains patent without high-grade stenosis. Severe stenosis at the origin of the left SCA (series 1009, image 11). Moderate to severe stenoses noted involving the proximal and mid right SCA. Both PCAs primarily supplied via the basilar. Severe proximal right P1 stenosis, with additional severe short-segment right P2 stenosis (series 1003, image 12). Diffuse atheromatous irregularity throughout the remainder the PCAs without high-grade stenosis.  No intracranial aneurysm.  MRA NECK FINDINGS  AORTIC ARCH: Examination technically limited by motion artifact and timing of the contrast  bolus.  Visualized aortic arch of normal caliber with normal branch pattern. No appreciable flow-limiting stenosis about the origin of the great vessels.  RIGHT CAROTID SYSTEM: Antegrade  and widely patent flow seen within the right common and internal carotid arteries to the skull base. No appreciable stenosis seen about the right carotid bifurcation. Right ICA partially medialized into the retropharyngeal space.  LEFT CAROTID SYSTEM: Antegrade and widely patent flow seen within the left common and internal carotid arteries to the skull base. No appreciable significant stenosis about the left bifurcation. Left ICA partially medialized into the retropharyngeal space.  VERTEBRAL ARTERIES: Dominant left vertebral artery demonstrates widely patent antegrade flow. Hypoplastic right vertebral artery largely occluded proximally, with no significant flow seen until the distal V2/V3 segments. Right vertebral artery is patent as it courses into the cranial vault.  IMPRESSION: MRI HEAD IMPRESSION:  1. No acute intracranial abnormality. 2. Chronic microvascular ischemic disease with associated remote lacunar infarcts involving the bilateral basal ganglia, with additional chronic bilateral occipital lobe infarcts.  MRA HEAD IMPRESSION:  1. Negative MRA for large vessel occlusion. 2. Extensive atherosclerotic disease involving the anterior circulation with associated severe bilateral A1 and proximal right A2 stenoses as above. Additional more moderate stenosis at the proximal cavernous right ICA. 3. Severe tandem stenoses involving the distal left V4 segment, with additional severe bilateral SCA, right P1, and right P2 stenoses as above. Left vertebral artery dominant, with the hypoplastic right vertebral artery terminating in PICA.  MRA NECK IMPRESSION:  1. No significant flow within the hypoplastic right vertebral artery until the distal V2/V3 segments. Dominant left  vertebral artery widely patent within the neck. 2. Wide patency of both carotid artery systems within the neck.   Electronically Signed   By: Rise Mu M.D.   On: 08/01/2019 03:24   Treatments:  Treatment of blood pressure.  Discharge Exam: Blood pressure 118/66, pulse 72, temperature 97.7 F (36.5 C), temperature source Oral, resp. rate 18, height 5\' 4"  (1.626 m), weight 111.2 kg, SpO2 100 %. General appearance: alert and cooperative Resp: clear to auscultation bilaterally Cardio: regular rate and rhythm, S1, S2 normal, no murmur, click, rub or gallop GI: soft, non-tender; bowel sounds normal; no masses,  no organomegaly Extremities: extremities normal, atraumatic, no cyanosis or edema  Disposition: Discharge disposition: 01-Home or Self Care       Discharge Instructions    Diet - low sodium heart healthy   Complete by: As directed    Increase activity slowly   Complete by: As directed      Allergies as of 08/02/2019      Reactions   Citalopram Other (See Comments)   Feels odd   Doxycycline Hyclate Nausea And Vomiting   Erythromycin Other (See Comments)   Irritated stomach   Montelukast Sodium Itching   Nitrofurantoin Nausea And Vomiting   Sulfa Antibiotics Nausea And Vomiting   Tramadol Hcl Other (See Comments)   Feels odd   Venlafaxine Hcl Other (See Comments)   Feels odd   Amoxicillin-pot Clavulanate Nausea And Vomiting   Clarithromycin Rash    See ER note 02/2017      Medication List    TAKE these medications   acetaminophen 325 MG tablet Commonly known as: TYLENOL Take 650 mg by mouth every 6 (six) hours as needed.   amLODipine 5 MG tablet Commonly known as: NORVASC Take 2 tablets (10 mg total) by mouth daily. What changed: See the new instructions.   aspirin EC 81 MG tablet Take 81 mg by mouth every evening.   atorvastatin 80 MG tablet Commonly known as: Lipitor Take 1 tablet (80 mg total) by mouth daily. What changed: when  to  take this   clopidogrel 75 MG tablet Commonly known as: PLAVIX Take 1 tablet by mouth every evening.   glipiZIDE 5 MG 24 hr tablet Commonly known as: GLUCOTROL XL TAKE 1 TABLET BY MOUTH EVERY DAY WITH BREAKFAST   lansoprazole 30 MG capsule Commonly known as: PREVACID Take 1 capsule (30 mg total) by mouth daily at 12 noon.   lisinopril-hydrochlorothiazide 20-25 MG tablet Commonly known as: ZESTORETIC Take 1 tablet by mouth every morning.   metoprolol succinate 25 MG 24 hr tablet Commonly known as: TOPROL-XL Take 12.5 mg by mouth daily.       Follow-up Information    Tillman Abide I, MD Follow up in 1 week(s).   Specialties: Internal Medicine, Pediatrics Contact information: [address] [place] [address] [phone]               Signed: Marrion Coy 08/02/2019, 12:22 PM

## 2019-08-02 NOTE — Discharge Instructions (Signed)
1.  Follow-up with PCP in 1 week. 2.  Take melatonin 5 mg every evening, may talk to the family doctor change to trazodone if does not work. 3.  May be referred to psychiatry for depression.

## 2019-08-08 ENCOUNTER — Encounter: Payer: Self-pay | Admitting: Internal Medicine

## 2019-08-08 ENCOUNTER — Ambulatory Visit (INDEPENDENT_AMBULATORY_CARE_PROVIDER_SITE_OTHER): Payer: 59 | Admitting: Internal Medicine

## 2019-08-08 ENCOUNTER — Other Ambulatory Visit: Payer: Self-pay

## 2019-08-08 DIAGNOSIS — I69351 Hemiplegia and hemiparesis following cerebral infarction affecting right dominant side: Secondary | ICD-10-CM | POA: Diagnosis not present

## 2019-08-08 DIAGNOSIS — I161 Hypertensive emergency: Secondary | ICD-10-CM | POA: Diagnosis not present

## 2019-08-08 DIAGNOSIS — F39 Unspecified mood [affective] disorder: Secondary | ICD-10-CM | POA: Diagnosis not present

## 2019-08-08 MED ORDER — AMLODIPINE BESYLATE 10 MG PO TABS
10.0000 mg | ORAL_TABLET | Freq: Every day | ORAL | 3 refills | Status: DC
Start: 2019-08-08 — End: 2019-08-28

## 2019-08-08 MED ORDER — METOPROLOL SUCCINATE ER 25 MG PO TB24: 25.0000 mg | ORAL_TABLET | Freq: Every day | ORAL | 3 refills | Status: DC

## 2019-08-08 MED ORDER — ALPRAZOLAM 0.25 MG PO TABS
0.2500 mg | ORAL_TABLET | Freq: Every evening | ORAL | 0 refills | Status: DC | PRN
Start: 2019-08-08 — End: 2019-08-28

## 2019-08-08 NOTE — Assessment & Plan Note (Signed)
Chronic anxiety and dysthymia/depression The depression is reactive --so not MDD Has not tolerated citalopram, duloxetine or fluoxetine Will restart xanax for prn use only

## 2019-08-08 NOTE — Assessment & Plan Note (Signed)
Ongoing disability Has new papers for me to do

## 2019-08-08 NOTE — Patient Instructions (Signed)
Please take all your medications in the morning except the metoprolol and atorvastatin. Try the melatonin 5mg  at bedtime as well If you feel your blood pressure going up, or can't sleep--try the alprazolam (the new prescription)

## 2019-08-08 NOTE — Assessment & Plan Note (Signed)
BP Readings from Last 3 Encounters:  08/08/19 136/90  08/02/19 118/66  06/10/19 (!) 145/66   I think daughter is correct that she gets into a panic situation The beta blocker seems to help---will increase the dose and give at bedtime

## 2019-08-08 NOTE — Progress Notes (Signed)
Subjective:    Patient ID: Tami Jones, female    DOB: 04-Jun-1955, 64 y.o.   MRN: [id number]  HPI Here for hospital follow up This visit occurred during the SARS-CoV-2 public health emergency.  Safety protocols were in place, including screening questions prior to the visit, additional usage of staff PPE, and extensive cleaning of exam room while observing appropriate contact time as indicated for disinfecting solutions.   Has noticed her BP going up at home Takes amlodipine to 5 bid (increased at hospital) Wonders if 2PM lisinopril/HCTZ is "hurting my kidneys" Doesn't feel right--but thinks the metoprolol helped her  Her BP went up so high once, she called EMS Felt disoriented BP 201/101 Couldn't even walk Taken to [facility]---"hypertensive urgency" Got IV labetalol and BP came down  Daughter was worried about panic attack See note she wrote Ongoing sleep problems---then she gets anxious Metoprolol has helped this though Tried melatonin---not overly helpful  Reviewed hospital records and discharge summary  Current Outpatient Medications on File Prior to Visit  Medication Sig Dispense Refill   acetaminophen (TYLENOL) 325 MG tablet Take 650 mg by mouth every 6 (six) hours as needed.     amLODipine (NORVASC) 5 MG tablet Take 2 tablets (10 mg total) by mouth daily. 60 tablet 0   aspirin EC 81 MG tablet Take 81 mg by mouth every evening.      atorvastatin (LIPITOR) 80 MG tablet Take 1 tablet (80 mg total) by mouth daily. (Patient taking differently: Take 80 mg by mouth every evening. ) 90 tablet 3   clopidogrel (PLAVIX) 75 MG tablet Take 1 tablet by mouth every evening.      glipiZIDE (GLUCOTROL XL) 5 MG 24 hr tablet TAKE 1 TABLET BY MOUTH EVERY DAY WITH BREAKFAST 90 tablet 0   lansoprazole (PREVACID) 30 MG capsule Take 1 capsule (30 mg total) by mouth daily at 12 noon. 30 capsule 11   lisinopril-hydrochlorothiazide (ZESTORETIC) 20-25 MG tablet Take 1 tablet by mouth every  morning. 90 tablet 3   metoprolol succinate (TOPROL-XL) 25 MG 24 hr tablet Take 12.5 mg by mouth daily.     No current facility-administered medications on file prior to visit.    Allergies  Allergen Reactions   Citalopram Other (See Comments)    Feels odd   Doxycycline Hyclate Nausea And Vomiting   Erythromycin Other (See Comments)    Irritated stomach   Montelukast Sodium Itching   Nitrofurantoin Nausea And Vomiting   Sulfa Antibiotics Nausea And Vomiting   Tramadol Hcl Other (See Comments)    Feels odd   Venlafaxine Hcl Other (See Comments)    Feels odd   Amoxicillin-Pot Clavulanate Nausea And Vomiting   Clarithromycin Rash     See ER note 02/2017    Past Medical History:  Diagnosis Date   Anxiety    Asthma    CVA (cerebral vascular accident) ([place]) 03/2006   right occipital, Dr. Doy Mince   Diabetes mellitus    GERD (gastroesophageal reflux disease)    Hyperlipidemia    Hypertension     Past Surgical History:  Procedure Laterality Date   CARDIOVASCULAR STRESS TEST  1/15   myoview. EF 60%   CESAREAN SECTION     ESOPHAGOGASTRODUODENOSCOPY  05/2005   TONSILLECTOMY AND ADENOIDECTOMY      Family History  Problem Relation Age of Onset   Coronary artery disease Mother    Diabetes Mellitus II Mother    Heart attack Mother 62   Diabetes Mellitus II  Maternal Grandmother     Social History   Socioeconomic History   Marital status: Divorced    Spouse name: Not on file   Number of children: 2   Years of education: Not on file   Highest education level: Not on file  Occupational History   Occupation: LAB TECH    Employer: [employer]  Tobacco Use   Smoking status: Never Smoker   Smokeless tobacco: Never Used  Building services engineer Use: Never used  Substance and Sexual Activity   Alcohol use: No    Alcohol/week: 0.0 standard drinks    Comment: wine occassionally   Drug use: No   Sexual activity: Yes    Birth  control/protection: Condom  Other Topics Concern   Not on file  Social History Narrative   Not on file   Social Determinants of Health   Financial Resource Strain:    Difficulty of Paying Living Expenses:   Food Insecurity:    Worried About Programme researcher, broadcasting/film/video in the Last Year:    Barista in the Last Year:   Transportation Needs:    Freight forwarder (Medical):    Lack of Transportation (Non-Medical):   Physical Activity:    Days of Exercise per Week:    Minutes of Exercise per Session:   Stress:    Feeling of Stress :   Social Connections:    Frequency of Communication with Friends and Family:    Frequency of Social Gatherings with Friends and Family:    Attends Religious Services:    Active Member of Clubs or Organizations:    Attends Engineer, structural:    Marital Status:   Intimate Partner Violence:    Fear of Current or Ex-Partner:    Emotionally Abused:    Physically Abused:    Sexually Abused:    Review of Systems Has trouble with tooth---this causes pain and dental visit is pending (needs her insurance before she can go) Appetite is off     Objective:   Physical Exam  Constitutional: No distress.  Cardiovascular: Normal rate and regular rhythm. Exam reveals no gallop.  No murmur heard. Respiratory: Effort normal and breath sounds normal. She has no wheezes. She has no rales.  Musculoskeletal:     Right lower leg: No edema.     Left lower leg: No edema.  Lymphadenopathy:    She has no cervical adenopathy.  Neurological: She is alert.  Psychiatric:  Seems depressed--some psychomotor retardation (nothing new for her)           Assessment & Plan:

## 2019-08-24 ENCOUNTER — Emergency Department
Admission: EM | Admit: 2019-08-24 | Discharge: 2019-08-24 | Disposition: A | Discharge status: Home / Self Care | Payer: 59 | Source: Home / Self Care | Attending: Student in an Organized Health Care Education/Training Program | Admitting: Student in an Organized Health Care Education/Training Program

## 2019-08-24 ENCOUNTER — Other Ambulatory Visit: Payer: Self-pay

## 2019-08-24 ENCOUNTER — Telehealth: Payer: Self-pay | Admitting: Internal Medicine

## 2019-08-24 ENCOUNTER — Emergency Department: Payer: 59

## 2019-08-24 DIAGNOSIS — G9340 Encephalopathy, unspecified: Secondary | ICD-10-CM | POA: Diagnosis not present

## 2019-08-24 DIAGNOSIS — E1122 Type 2 diabetes mellitus with diabetic chronic kidney disease: Secondary | ICD-10-CM | POA: Insufficient documentation

## 2019-08-24 DIAGNOSIS — R0789 Other chest pain: Secondary | ICD-10-CM | POA: Insufficient documentation

## 2019-08-24 DIAGNOSIS — G9341 Metabolic encephalopathy: Secondary | ICD-10-CM | POA: Diagnosis not present

## 2019-08-24 DIAGNOSIS — N1831 Chronic kidney disease, stage 3a: Secondary | ICD-10-CM | POA: Insufficient documentation

## 2019-08-24 DIAGNOSIS — I159 Secondary hypertension, unspecified: Secondary | ICD-10-CM

## 2019-08-24 DIAGNOSIS — R519 Headache, unspecified: Secondary | ICD-10-CM | POA: Insufficient documentation

## 2019-08-24 DIAGNOSIS — Z79899 Other long term (current) drug therapy: Secondary | ICD-10-CM | POA: Insufficient documentation

## 2019-08-24 DIAGNOSIS — Z7982 Long term (current) use of aspirin: Secondary | ICD-10-CM | POA: Insufficient documentation

## 2019-08-24 DIAGNOSIS — I129 Hypertensive chronic kidney disease with stage 1 through stage 4 chronic kidney disease, or unspecified chronic kidney disease: Secondary | ICD-10-CM | POA: Insufficient documentation

## 2019-08-24 LAB — CBC
HCT: 41.4 % (ref 36.0–46.0)
Hemoglobin: 14 g/dL (ref 12.0–15.0)
MCH: 30.7 pg (ref 26.0–34.0)
MCHC: 33.8 g/dL (ref 30.0–36.0)
MCV: 90.8 fL (ref 80.0–100.0)
Platelets: 255 10*3/uL (ref 150–400)
RBC: 4.56 MIL/uL (ref 3.87–5.11)
RDW: 11.9 % (ref 11.5–15.5)
WBC: 7.2 10*3/uL (ref 4.0–10.5)
nRBC: 0 % (ref 0.0–0.2)

## 2019-08-24 LAB — BASIC METABOLIC PANEL
Anion gap: 15 (ref 5–15)
BUN: 22 mg/dL (ref 8–23)
CO2: 21 mmol/L — ABNORMAL LOW (ref 22–32)
Calcium: 9 mg/dL (ref 8.9–10.3)
Chloride: 101 mmol/L (ref 98–111)
Creatinine, Ser: 1.16 mg/dL — ABNORMAL HIGH (ref 0.44–1.00)
GFR calc Af Amer: 58 mL/min — ABNORMAL LOW (ref >60)
GFR calc non Af Amer: 50 mL/min — ABNORMAL LOW (ref >60)
Glucose, Bld: 136 mg/dL — ABNORMAL HIGH (ref 70–99)
Potassium: 3.7 mmol/L (ref 3.5–5.1)
Sodium: 137 mmol/L (ref 135–145)

## 2019-08-24 LAB — TROPONIN I (HIGH SENSITIVITY)
Troponin I (High Sensitivity): 7 ng/L (ref <18)
Troponin I (High Sensitivity): 8 ng/L (ref <18)

## 2019-08-24 MED ORDER — SODIUM CHLORIDE 0.9% FLUSH: 3.0000 mL | Freq: Once | INTRAVENOUS | Status: DC

## 2019-08-24 MED ORDER — ACETAMINOPHEN 325 MG PO TABS
650.0000 mg | ORAL_TABLET | Freq: Once | ORAL | Status: AC
  Administered 2019-08-24: 650 mg via ORAL
  Filled 2019-08-24: qty 2

## 2019-08-24 MED ORDER — ALPRAZOLAM 0.5 MG PO TABS
0.2500 mg | ORAL_TABLET | Freq: Once | ORAL | Status: AC
  Administered 2019-08-24: 0.25 mg via ORAL
  Filled 2019-08-24: qty 1

## 2019-08-24 MED ORDER — ONDANSETRON 4 MG PO TBDP
4.0000 mg | ORAL_TABLET | Freq: Once | ORAL | Status: AC | PRN
  Administered 2019-08-24: 4 mg via ORAL
  Filled 2019-08-24: qty 1

## 2019-08-24 MED ORDER — AMLODIPINE BESYLATE 5 MG PO TABS
5.0000 mg | ORAL_TABLET | Freq: Once | ORAL | Status: AC
  Administered 2019-08-24: 5 mg via ORAL
  Filled 2019-08-24: qty 1

## 2019-08-24 NOTE — ED Notes (Addendum)
Pt denies CP currently. Pt reports history of L-sided dull, intermittent chest pain, nausea at times. Skin dry. Resp reg/unlabored. History of anxiety and DM2. Reports takes a half xanax and melatonin at night as needed. States hasn't eaten since this morning. Currently c/o 8/10 dull L-sided HA and blurred vision at times. A&Ox4. Pt currently NSR on monitor with similar slight ST elevation as previous EKG from 1700 today.

## 2019-08-24 NOTE — ED Notes (Signed)
Pt called family to come pick her up using personal phone. Pt wheeled out to lobby.

## 2019-08-24 NOTE — ED Provider Notes (Signed)
[facility] Emergency Department Provider Note    First MD Initiated Contact with Patient 08/24/19 2157     (approximate)  I have reviewed the triage vital signs and the nursing notes.   HISTORY  Chief Complaint Hypertension, Fatigue, and Chest Pain    HPI Lia [place] is a 64 y.o. female bolus past medical history presents to the ER for evaluation of concern of her blood pressure.  States that she has daily headaches as well as feeling pressure in her chest daily and an overwhelming sense of stress worrying about her blood pressure.  Was recently admitted for elevated blood pressure and hypertensive urgency.  Multiple medication changes have been made recently with improvement in her blood pressure medication regimen.  She states that she does have a history of anxiety.  Denies any SI or HI.  No recent fevers.  She is been compliant with her medications.     Past Medical History:  Diagnosis Date  . Anxiety   . Asthma   . 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 Thad Ranger  . Diabetes mellitus   . GERD (gastroesophageal reflux disease)   . Hyperlipidemia   . Hypertension    Family History  Problem Relation Age of Onset  . Coronary artery disease Mother   . Diabetes Mellitus II Mother   . Heart attack Mother 76  . Diabetes Mellitus II Maternal Grandmother    Past Surgical History:  Procedure Laterality Date  . CARDIOVASCULAR STRESS TEST  1/15   myoview. EF 60%  . CESAREAN SECTION    . ESOPHAGOGASTRODUODENOSCOPY  05/2005  . TONSILLECTOMY AND ADENOIDECTOMY     Patient Active Problem List   Diagnosis Date Noted  . Hypertensive emergency, no CHF 08/01/2019  . Elevated troponin 08/01/2019  . Hypertension   . HLD (hyperlipidemia)   . Type II diabetes mellitus with renal manifestations (HCC)   . CKD (chronic kidney disease), stage IIIa   . Depression   . Gross hematuria 01/15/2019  . Dysuria 01/15/2019  . Right  shoulder pain 12/04/2018  . Neuropathic pain, arm 10/26/2018  . MDD (major depressive disorder), single episode, moderate (HCC) 09/21/2018  . Hemiparesis of right dominant side (HCC) 08/17/2018  . SOB (shortness of breath) 03/08/2017  . Vestibular dizziness 01/21/2016  . Tick bite 07/15/2015  . Cerebrovascular disease 07/17/2014  . Preventative health care 06/25/2014  . Right sided sciatica 03/08/2013  . Chest pain 01/24/2012  . MENOPAUSAL SYNDROME 09/10/2008  . Mood disorder (HCC) 09/15/2007  . Essential hypertension, benign 08/10/2006  . Type 2 diabetes mellitus with other circulatory complications (HCC) 06/17/2006  . HYPERCHOLESTEROLEMIA 06/17/2006  . GERD 06/17/2006      Prior to Admission medications   Medication Sig Start Date End Date Taking? Authorizing Provider  acetaminophen (TYLENOL) 325 MG tablet Take 650 mg by mouth every 6 (six) hours as needed.    [provider]  ALPRAZolam Prudy Feeler) 0.25 MG tablet Take 1 tablet (0.25 mg total) by mouth at bedtime as needed for anxiety. 08/08/19   Karie Schwalbe, MD  amLODipine (NORVASC) 10 MG tablet Take 1 tablet (10 mg total) by mouth daily. 08/08/19   Karie Schwalbe, MD  aspirin EC 81 MG tablet Take 81 mg by mouth every evening.     [provider]  atorvastatin (LIPITOR) 80 MG tablet Take 1 tablet (80 mg total) by mouth daily. Patient taking differently: Take 80 mg by mouth every evening.  09/21/18 09/21/19  Tillman Abide I, MD  glipiZIDE (GLUCOTROL XL) 5 MG 24 hr tablet TAKE 1 TABLET BY MOUTH EVERY DAY WITH BREAKFAST 07/30/19   Karie Schwalbe, MD  lansoprazole (PREVACID) 30 MG capsule Take 1 capsule (30 mg total) by mouth daily at 12 noon. 08/17/18   Karie Schwalbe, MD  lisinopril-hydrochlorothiazide (ZESTORETIC) 20-25 MG tablet Take 1 tablet by mouth every morning. 04/02/19   Karie Schwalbe, MD  metoprolol succinate (TOPROL-XL) 25 MG 24 hr tablet Take 1 tablet (25 mg total) by mouth daily. 08/08/19 08/07/20   Karie Schwalbe, MD    Allergies Citalopram, Doxycycline hyclate, Erythromycin, Montelukast sodium, Nitrofurantoin, Sulfa antibiotics, Tramadol hcl, Venlafaxine hcl, Amoxicillin-pot clavulanate, and Clarithromycin    Social History Social History   Tobacco Use  . Smoking status: Never Smoker  . Smokeless tobacco: Never Used  Vaping Use  . Vaping Use: Never used  Substance Use Topics  . Alcohol use: No    Alcohol/week: 0.0 standard drinks    Comment: wine occassionally  . Drug use: No    Review of Systems Patient denies headaches, rhinorrhea, blurry vision, numbness, shortness of breath, chest pain, edema, cough, abdominal pain, nausea, vomiting, diarrhea, dysuria, fevers, rashes or hallucinations unless otherwise stated above in HPI. ____________________________________________   PHYSICAL EXAM:  VITAL SIGNS: Vitals:   08/24/19 2330 08/24/19 2338  BP: 126/80   Pulse: 69 71  Resp: 16 15  Temp:    SpO2: 96% 96%    Constitutional: Alert and oriented.  Eyes: Conjunctivae are normal.  Head: Atraumatic. Nose: No congestion/rhinnorhea. Mouth/Throat: Mucous membranes are moist.   Neck: No stridor. Painless ROM.  Cardiovascular: Normal rate, regular rhythm. Grossly normal heart sounds.  Good peripheral circulation. Respiratory: Normal respiratory effort.  No retractions. Lungs CTAB. Gastrointestinal: Soft and nontender. No distention. No abdominal bruits. No CVA tenderness. Genitourinary:  Musculoskeletal: No lower extremity tenderness nor edema.  No joint effusions. Neurologic:  Normal speech and language. No gross focal neurologic deficits are appreciated. No facial droop Skin:  Skin is warm, dry and intact. No rash noted. Psychiatric: Mood and affect are normal. Speech and behavior are normal.  ____________________________________________   LABS (all labs ordered are listed, but only abnormal results are displayed)  Results for orders placed or performed during  the hospital encounter of 08/24/19 (from the past 24 hour(s))  Basic metabolic panel     Status: Abnormal   Collection Time: 08/24/19  5:26 PM  Result Value Ref Range   Sodium 137 135 - 145 mmol/L   Potassium 3.7 3.5 - 5.1 mmol/L   Chloride 101 98 - 111 mmol/L   CO2 21 (L) 22 - 32 mmol/L   Glucose, Bld 136 (H) 70 - 99 mg/dL   BUN 22 8 - 23 mg/dL   Creatinine, Ser 2.95 (H) 0.44 - 1.00 mg/dL   Calcium 9.0 8.9 - 18.8 mg/dL   GFR calc non Af Amer 50 (L) >60 mL/min   GFR calc Af Amer 58 (L) >60 mL/min   Anion gap 15 5 - 15  CBC     Status: None   Collection Time: 08/24/19  5:26 PM  Result Value Ref Range   WBC 7.2 4.0 - 10.5 K/uL   RBC 4.56 3.87 - 5.11 MIL/uL   Hemoglobin 14.0 12.0 - 15.0 g/dL   HCT 41.6 36 - 46 %   MCV 90.8 80.0 - 100.0 fL   MCH 30.7 26.0 - 34.0 pg   MCHC 33.8 30.0 -  36.0 g/dL   RDW 34.1 93.7 - 90.2 %   Platelets 255 150 - 400 K/uL   nRBC 0.0 0.0 - 0.2 %  Troponin I (High Sensitivity)     Status: None   Collection Time: 08/24/19  5:26 PM  Result Value Ref Range   Troponin I (High Sensitivity) 7 <18 ng/L  Troponin I (High Sensitivity)     Status: None   Collection Time: 08/24/19  9:51 PM  Result Value Ref Range   Troponin I (High Sensitivity) 8 <18 ng/L   ____________________________________________  EKG My review and personal interpretation at Time: 21:41   Indication: htn  Rate: 100  Rhythm: sinus Axis: normal Other: normal intervals, no stemi, nonspecific T wave abn ____________________________________________  RADIOLOGY  Mix one tablespoon with 8oz of your favorite juice or water every day until you are having soft formed stools. Then start taking once daily if you didn't have a stool the day before.  ____________________________________________   PROCEDURES  Procedure(s) performed:  Procedures    Critical Care performed: o ____________________________________________   INITIAL IMPRESSION / ASSESSMENT AND PLAN / ED COURSE  Pertinent  labs & imaging results that were available during my care of the patient were reviewed by me and considered in my medical decision making (see chart for details).   DDX: Hypertensive urgency, stress, panic, anxiety, ACS, CHF  Tami Jones is a 64 y.o. who presents to the ED with symptoms as described above.  Patient very anxious appearing mildly elevated blood pressure.  Her neuro exam is reassuring.  Denies any significant headache at this time.  No numbness or tingling.  Do not feel that neuroimaging clinically indicated at this time.  Will reassess however.  Will get blood pressure medication as well as anxiolytic.  Her EKG is nonischemic.  Initial enzyme is negative.  Will order serial enzymes observe in the ER.  Clinical Course as of Aug 23 2348  Fri Aug 24, 2019  2310 Patient reassessed.  Blood pressure normal now.  Feels well.  I do suspect a large component of this is related to anxiety her enzymes are negative.  Neuro exam is nonfocal.  States that she has chronic daily headaches.  I do not feel that she requires CT imaging as she is not toxic appearing and has had similar presentations similar discomfort in the past.  Not currently have any headache or signs of meningismus.  Does not seem consistent with ICH.  Does appear clinically appropriate for outpatient follow-up.   [PR]    Clinical Course User Index [PR] Willy Eddy, MD    The patient was evaluated in Emergency Department today for the symptoms described in the history of present illness. He/she was evaluated in the context of the global COVID-19 pandemic, which necessitated consideration that the patient might be at risk for infection with the SARS-CoV-2 virus that causes COVID-19. Institutional protocols and algorithms that pertain to the evaluation of patients at risk for COVID-19 are in a state of rapid change based on information released by regulatory bodies including the CDC and federal and state organizations. These  policies and algorithms were followed during the patient's care in the ED.  As part of my medical decision making, I reviewed the following data within the electronic MEDICAL RECORD NUMBER Nursing notes reviewed and incorporated, Labs reviewed, notes from prior ED visits and [place] Controlled Substance Database   ____________________________________________   FINAL CLINICAL IMPRESSION(S) / ED DIAGNOSES  Final diagnoses:  Secondary hypertension  NEW MEDICATIONS STARTED DURING THIS VISIT:  Discharge Medication List as of 08/24/2019 11:12 PM       Note:  This document was prepared using Dragon voice recognition software and may include unintentional dictation errors.    Willy Eddyobinson, Libbi Towner, MD 08/24/19 2350

## 2019-08-24 NOTE — ED Triage Notes (Signed)
Pt states that she has been dealing with her htn for awhile, over the past few months, has been placed on different medications that seem to help awhile then stop, pt is tearful and states frustration that she feels so bad and has no energy and her head has a lot of pressure when her bp is high, pt also states some ache across the left side of her chest

## 2019-08-24 NOTE — ED Notes (Signed)
Pt denies HA or any other pain. Pt denies anxiety.

## 2019-08-24 NOTE — Telephone Encounter (Signed)
Patient called today and stated her Bp was 159/116. Also having dizziness, nausea and headache. We sent patient over to be triaged by access and they advised her to go to the ED. Patient called back and wanted you to be aware that she was going to go to the ED >

## 2019-08-24 NOTE — ED Notes (Signed)
Pt given food tray and drink.  

## 2019-08-25 ENCOUNTER — Observation Stay: Payer: 59

## 2019-08-25 ENCOUNTER — Emergency Department: Payer: 59

## 2019-08-25 ENCOUNTER — Other Ambulatory Visit: Payer: Self-pay

## 2019-08-25 ENCOUNTER — Inpatient Hospital Stay
Admission: EM | Admit: 2019-08-25 | Discharge: 2019-08-28 | DRG: 071 | Disposition: A | Discharge status: Home / Self Care | Payer: 59 | Attending: Internal Medicine | Admitting: Internal Medicine

## 2019-08-25 DIAGNOSIS — E1122 Type 2 diabetes mellitus with diabetic chronic kidney disease: Secondary | ICD-10-CM | POA: Diagnosis present

## 2019-08-25 DIAGNOSIS — E7800 Pure hypercholesterolemia, unspecified: Secondary | ICD-10-CM | POA: Diagnosis present

## 2019-08-25 DIAGNOSIS — R110 Nausea: Secondary | ICD-10-CM | POA: Insufficient documentation

## 2019-08-25 DIAGNOSIS — I6930 Unspecified sequelae of cerebral infarction: Secondary | ICD-10-CM

## 2019-08-25 DIAGNOSIS — R471 Dysarthria and anarthria: Secondary | ICD-10-CM | POA: Diagnosis present

## 2019-08-25 DIAGNOSIS — N189 Chronic kidney disease, unspecified: Secondary | ICD-10-CM

## 2019-08-25 DIAGNOSIS — E119 Type 2 diabetes mellitus without complications: Secondary | ICD-10-CM

## 2019-08-25 DIAGNOSIS — R0789 Other chest pain: Secondary | ICD-10-CM | POA: Diagnosis present

## 2019-08-25 DIAGNOSIS — N1831 Chronic kidney disease, stage 3a: Secondary | ICD-10-CM | POA: Diagnosis present

## 2019-08-25 DIAGNOSIS — R112 Nausea with vomiting, unspecified: Secondary | ICD-10-CM | POA: Diagnosis present

## 2019-08-25 DIAGNOSIS — E1159 Type 2 diabetes mellitus with other circulatory complications: Secondary | ICD-10-CM

## 2019-08-25 DIAGNOSIS — N183 Chronic kidney disease, stage 3 unspecified: Secondary | ICD-10-CM | POA: Diagnosis present

## 2019-08-25 DIAGNOSIS — N179 Acute kidney failure, unspecified: Secondary | ICD-10-CM

## 2019-08-25 DIAGNOSIS — Z7982 Long term (current) use of aspirin: Secondary | ICD-10-CM

## 2019-08-25 DIAGNOSIS — I10 Essential (primary) hypertension: Secondary | ICD-10-CM | POA: Diagnosis present

## 2019-08-25 DIAGNOSIS — I69351 Hemiplegia and hemiparesis following cerebral infarction affecting right dominant side: Secondary | ICD-10-CM

## 2019-08-25 DIAGNOSIS — K219 Gastro-esophageal reflux disease without esophagitis: Secondary | ICD-10-CM | POA: Diagnosis present

## 2019-08-25 DIAGNOSIS — I639 Cerebral infarction, unspecified: Secondary | ICD-10-CM

## 2019-08-25 DIAGNOSIS — Z8249 Family history of ischemic heart disease and other diseases of the circulatory system: Secondary | ICD-10-CM

## 2019-08-25 DIAGNOSIS — R4701 Aphasia: Secondary | ICD-10-CM | POA: Diagnosis present

## 2019-08-25 DIAGNOSIS — Z880 Allergy status to penicillin: Secondary | ICD-10-CM

## 2019-08-25 DIAGNOSIS — I1310 Hypertensive heart and chronic kidney disease without heart failure, with stage 1 through stage 4 chronic kidney disease, or unspecified chronic kidney disease: Secondary | ICD-10-CM | POA: Diagnosis present

## 2019-08-25 DIAGNOSIS — Z882 Allergy status to sulfonamides status: Secondary | ICD-10-CM

## 2019-08-25 DIAGNOSIS — R519 Headache, unspecified: Secondary | ICD-10-CM

## 2019-08-25 DIAGNOSIS — E1169 Type 2 diabetes mellitus with other specified complication: Secondary | ICD-10-CM | POA: Diagnosis present

## 2019-08-25 DIAGNOSIS — G9341 Metabolic encephalopathy: Principal | ICD-10-CM | POA: Diagnosis present

## 2019-08-25 DIAGNOSIS — Z79899 Other long term (current) drug therapy: Secondary | ICD-10-CM

## 2019-08-25 DIAGNOSIS — E785 Hyperlipidemia, unspecified: Secondary | ICD-10-CM | POA: Diagnosis present

## 2019-08-25 DIAGNOSIS — J45909 Unspecified asthma, uncomplicated: Secondary | ICD-10-CM | POA: Diagnosis present

## 2019-08-25 DIAGNOSIS — G9340 Encephalopathy, unspecified: Secondary | ICD-10-CM | POA: Diagnosis not present

## 2019-08-25 DIAGNOSIS — F329 Major depressive disorder, single episode, unspecified: Secondary | ICD-10-CM | POA: Diagnosis present

## 2019-08-25 DIAGNOSIS — Z881 Allergy status to other antibiotic agents status: Secondary | ICD-10-CM

## 2019-08-25 DIAGNOSIS — Z7984 Long term (current) use of oral hypoglycemic drugs: Secondary | ICD-10-CM

## 2019-08-25 DIAGNOSIS — M79602 Pain in left arm: Secondary | ICD-10-CM | POA: Diagnosis present

## 2019-08-25 DIAGNOSIS — I159 Secondary hypertension, unspecified: Secondary | ICD-10-CM | POA: Diagnosis present

## 2019-08-25 DIAGNOSIS — Z20822 Contact with and (suspected) exposure to covid-19: Secondary | ICD-10-CM | POA: Diagnosis present

## 2019-08-25 DIAGNOSIS — F32A Depression, unspecified: Secondary | ICD-10-CM | POA: Diagnosis present

## 2019-08-25 DIAGNOSIS — Z833 Family history of diabetes mellitus: Secondary | ICD-10-CM

## 2019-08-25 DIAGNOSIS — R29718 NIHSS score 18: Secondary | ICD-10-CM | POA: Diagnosis present

## 2019-08-25 DIAGNOSIS — R531 Weakness: Secondary | ICD-10-CM

## 2019-08-25 DIAGNOSIS — Z888 Allergy status to other drugs, medicaments and biological substances status: Secondary | ICD-10-CM

## 2019-08-25 DIAGNOSIS — F419 Anxiety disorder, unspecified: Secondary | ICD-10-CM | POA: Insufficient documentation

## 2019-08-25 LAB — COMPREHENSIVE METABOLIC PANEL
ALT: 29 U/L (ref 0–44)
AST: 24 U/L (ref 15–41)
Albumin: 4.7 g/dL (ref 3.5–5.0)
Alkaline Phosphatase: 88 U/L (ref 38–126)
Anion gap: 13 (ref 5–15)
BUN: 23 mg/dL (ref 8–23)
CO2: 25 mmol/L (ref 22–32)
Calcium: 9.5 mg/dL (ref 8.9–10.3)
Chloride: 97 mmol/L — ABNORMAL LOW (ref 98–111)
Creatinine, Ser: 1.33 mg/dL — ABNORMAL HIGH (ref 0.44–1.00)
GFR calc Af Amer: 50 mL/min — ABNORMAL LOW (ref >60)
GFR calc non Af Amer: 43 mL/min — ABNORMAL LOW (ref >60)
Glucose, Bld: 235 mg/dL — ABNORMAL HIGH (ref 70–99)
Potassium: 4 mmol/L (ref 3.5–5.1)
Sodium: 135 mmol/L (ref 135–145)
Total Bilirubin: 0.7 mg/dL (ref 0.3–1.2)
Total Protein: 8 g/dL (ref 6.5–8.1)

## 2019-08-25 LAB — SARS CORONAVIRUS 2 BY RT PCR (HOSPITAL ORDER, PERFORMED IN ~~LOC~~ HOSPITAL LAB): SARS Coronavirus 2: NEGATIVE

## 2019-08-25 LAB — CBC WITH DIFFERENTIAL/PLATELET
Abs Immature Granulocytes: 0.02 10*3/uL (ref 0.00–0.07)
Basophils Absolute: 0 10*3/uL (ref 0.0–0.1)
Basophils Relative: 0 %
Eosinophils Absolute: 0 10*3/uL (ref 0.0–0.5)
Eosinophils Relative: 1 %
HCT: 43.1 % (ref 36.0–46.0)
Hemoglobin: 14.7 g/dL (ref 12.0–15.0)
Immature Granulocytes: 0 %
Lymphocytes Relative: 38 %
Lymphs Abs: 2.7 10*3/uL (ref 0.7–4.0)
MCH: 30.6 pg (ref 26.0–34.0)
MCHC: 34.1 g/dL (ref 30.0–36.0)
MCV: 89.6 fL (ref 80.0–100.0)
Monocytes Absolute: 0.5 10*3/uL (ref 0.1–1.0)
Monocytes Relative: 7 %
Neutro Abs: 3.9 10*3/uL (ref 1.7–7.7)
Neutrophils Relative %: 54 %
Platelets: 245 10*3/uL (ref 150–400)
RBC: 4.81 MIL/uL (ref 3.87–5.11)
RDW: 12 % (ref 11.5–15.5)
WBC: 7.2 10*3/uL (ref 4.0–10.5)
nRBC: 0 % (ref 0.0–0.2)

## 2019-08-25 LAB — URINALYSIS, ROUTINE W REFLEX MICROSCOPIC
Bilirubin Urine: NEGATIVE
Glucose, UA: 150 mg/dL — AB
Hgb urine dipstick: NEGATIVE
Ketones, ur: NEGATIVE mg/dL
Leukocytes,Ua: NEGATIVE
Nitrite: NEGATIVE
Protein, ur: NEGATIVE mg/dL
Specific Gravity, Urine: 1.009 (ref 1.005–1.030)
pH: 7 (ref 5.0–8.0)

## 2019-08-25 LAB — URINE DRUG SCREEN, QUALITATIVE (ARMC ONLY)
Amphetamines, Ur Screen: NOT DETECTED
Barbiturates, Ur Screen: NOT DETECTED
Benzodiazepine, Ur Scrn: NOT DETECTED
Cannabinoid 50 Ng, Ur ~~LOC~~: NOT DETECTED
Cocaine Metabolite,Ur ~~LOC~~: NOT DETECTED
MDMA (Ecstasy)Ur Screen: NOT DETECTED
Methadone Scn, Ur: NOT DETECTED
Opiate, Ur Screen: NOT DETECTED
Phencyclidine (PCP) Ur S: NOT DETECTED
Tricyclic, Ur Screen: NOT DETECTED

## 2019-08-25 LAB — PROTIME-INR
INR: 0.9 (ref 0.8–1.2)
Prothrombin Time: 11.8 seconds (ref 11.4–15.2)

## 2019-08-25 LAB — LIPID PANEL
Cholesterol: 233 mg/dL — ABNORMAL HIGH (ref 0–200)
HDL: 44 mg/dL (ref >40)
LDL Cholesterol: 159 mg/dL — ABNORMAL HIGH (ref 0–99)
Total CHOL/HDL Ratio: 5.3 RATIO
Triglycerides: 151 mg/dL — ABNORMAL HIGH (ref <150)
VLDL: 30 mg/dL (ref 0–40)

## 2019-08-25 LAB — GLUCOSE, CAPILLARY
Glucose-Capillary: 203 mg/dL — ABNORMAL HIGH (ref 70–99)
Glucose-Capillary: 167 mg/dL — ABNORMAL HIGH (ref 70–99)
Glucose-Capillary: 115 mg/dL — ABNORMAL HIGH (ref 70–99)
Glucose-Capillary: 135 mg/dL — ABNORMAL HIGH (ref 70–99)
Glucose-Capillary: 257 mg/dL — ABNORMAL HIGH (ref 70–99)

## 2019-08-25 LAB — ETHANOL: Alcohol, Ethyl (B): <10 mg/dL (ref <10)

## 2019-08-25 LAB — APTT: aPTT: 28 seconds (ref 24–36)

## 2019-08-25 MED ORDER — ALPRAZOLAM 0.25 MG PO TABS
0.2500 mg | ORAL_TABLET | Freq: Every evening | ORAL | Status: DC | PRN
  Administered 2019-08-25: 0.25 mg via ORAL
  Filled 2019-08-25: qty 1

## 2019-08-25 MED ORDER — INSULIN ASPART 100 UNIT/ML ~~LOC~~ SOLN
0.0000 [IU] | Freq: Every day | SUBCUTANEOUS | Status: DC
  Administered 2019-08-25: 3 [IU] via SUBCUTANEOUS
  Filled 2019-08-25: qty 1

## 2019-08-25 MED ORDER — DEXAMETHASONE SODIUM PHOSPHATE 10 MG/ML IJ SOLN
10.0000 mg | Freq: Once | INTRAMUSCULAR | Status: AC
  Administered 2019-08-25: 10 mg via INTRAVENOUS
  Filled 2019-08-25: qty 1

## 2019-08-25 MED ORDER — ENOXAPARIN SODIUM 40 MG/0.4ML ~~LOC~~ SOLN
40.0000 mg | Freq: Two times a day (BID) | SUBCUTANEOUS | Status: DC
  Administered 2019-08-25 – 2019-08-28 (×7): 40 mg via SUBCUTANEOUS
  Filled 2019-08-25 (×8): qty 0.4

## 2019-08-25 MED ORDER — MAGNESIUM SULFATE 2 GM/50ML IV SOLN
2.0000 g | Freq: Once | INTRAVENOUS | Status: AC
  Administered 2019-08-25: 2 g via INTRAVENOUS
  Filled 2019-08-25: qty 50

## 2019-08-25 MED ORDER — ONDANSETRON HCL 4 MG/2ML IJ SOLN
4.0000 mg | Freq: Four times a day (QID) | INTRAMUSCULAR | Status: DC | PRN
  Administered 2019-08-25 – 2019-08-26 (×3): 4 mg via INTRAVENOUS
  Filled 2019-08-25 (×3): qty 2

## 2019-08-25 MED ORDER — ASPIRIN 81 MG PO CHEW: 324.0000 mg | CHEWABLE_TABLET | Freq: Once | ORAL | Status: DC

## 2019-08-25 MED ORDER — INSULIN ASPART 100 UNIT/ML ~~LOC~~ SOLN
0.0000 [IU] | Freq: Three times a day (TID) | SUBCUTANEOUS | Status: DC
  Administered 2019-08-25: 3 [IU] via SUBCUTANEOUS
  Administered 2019-08-26 (×2): 15 [IU] via SUBCUTANEOUS
  Administered 2019-08-26: 8 [IU] via SUBCUTANEOUS
  Administered 2019-08-27: 3 [IU] via SUBCUTANEOUS
  Filled 2019-08-25 (×5): qty 1

## 2019-08-25 MED ORDER — SODIUM CHLORIDE 0.9% FLUSH
3.0000 mL | Freq: Once | INTRAVENOUS | Status: AC
  Administered 2019-08-25: 3 mL via INTRAVENOUS

## 2019-08-25 MED ORDER — ASPIRIN 300 MG RE SUPP
300.0000 mg | Freq: Once | RECTAL | Status: AC
  Administered 2019-08-25: 300 mg via RECTAL
  Filled 2019-08-25: qty 1

## 2019-08-25 MED ORDER — VALPROATE SODIUM 500 MG/5ML IV SOLN
750.0000 mg | Freq: Once | INTRAVENOUS | Status: AC
  Administered 2019-08-25: 750 mg via INTRAVENOUS
  Filled 2019-08-25: qty 7.5

## 2019-08-25 MED ORDER — ASPIRIN EC 81 MG PO TBEC
81.0000 mg | DELAYED_RELEASE_TABLET | Freq: Every evening | ORAL | Status: DC
  Administered 2019-08-25 – 2019-08-27 (×3): 81 mg via ORAL
  Filled 2019-08-25 (×3): qty 1

## 2019-08-25 MED ORDER — ACETAMINOPHEN 650 MG RE SUPP: 650.0000 mg | RECTAL | Status: DC | PRN

## 2019-08-25 MED ORDER — ACETAMINOPHEN 160 MG/5ML PO SOLN
650.0000 mg | ORAL | Status: DC | PRN
  Filled 2019-08-25: qty 20.3

## 2019-08-25 MED ORDER — CLOPIDOGREL BISULFATE 75 MG PO TABS
75.0000 mg | ORAL_TABLET | Freq: Every day | ORAL | Status: DC
  Administered 2019-08-25 – 2019-08-28 (×4): 75 mg via ORAL
  Filled 2019-08-25 (×4): qty 1

## 2019-08-25 MED ORDER — ATORVASTATIN CALCIUM 80 MG PO TABS
80.0000 mg | ORAL_TABLET | Freq: Every evening | ORAL | Status: DC
  Administered 2019-08-25 – 2019-08-27 (×4): 80 mg via ORAL
  Filled 2019-08-25 (×3): qty 1
  Filled 2019-08-25: qty 4

## 2019-08-25 MED ORDER — IOHEXOL 350 MG/ML SOLN
75.0000 mL | Freq: Once | INTRAVENOUS | Status: AC | PRN
  Administered 2019-08-25: 75 mL via INTRAVENOUS

## 2019-08-25 MED ORDER — STROKE: EARLY STAGES OF RECOVERY BOOK: Freq: Once | Status: DC

## 2019-08-25 MED ORDER — ACETAMINOPHEN 325 MG PO TABS
650.0000 mg | ORAL_TABLET | ORAL | Status: DC | PRN
  Administered 2019-08-26 – 2019-08-28 (×4): 650 mg via ORAL
  Filled 2019-08-25 (×4): qty 2

## 2019-08-25 MED ORDER — KETOROLAC TROMETHAMINE 30 MG/ML IJ SOLN
30.0000 mg | Freq: Once | INTRAMUSCULAR | Status: AC
  Administered 2019-08-25: 30 mg via INTRAVENOUS
  Filled 2019-08-25: qty 1

## 2019-08-25 MED ORDER — PROMETHAZINE HCL 25 MG/ML IJ SOLN
12.5000 mg | Freq: Once | INTRAMUSCULAR | Status: AC
  Administered 2019-08-25: 12.5 mg via INTRAVENOUS
  Filled 2019-08-25: qty 1

## 2019-08-25 MED ORDER — SODIUM CHLORIDE 0.9 % IV SOLN: INTRAVENOUS | Status: DC

## 2019-08-25 NOTE — ED Notes (Signed)
Report from Kim at this time.

## 2019-08-25 NOTE — Progress Notes (Addendum)
SLP Cancellation Note  Patient Details Name: Tami Jones MRN: [id number] DOB: Nov 29, 1955   Cancelled treatment:       Reason Eval/Treat Not Completed: SLP screened, no needs identified, will sign off; Per RN, patient recently passed [employer] Screen this morning without reported swallowing difficulty. Patient communicating at conversation level without noted expressive/receptive language issues. Oriented x4. No acute abnormalities detected on recent CT/MRI. At this time, skilled ST services not indicated. Patient and family agreed to notify MD/RN if changes in speech/language/cognition/swallowing occur during hospital stay.  Everlean Patterson, M.S. CCC-SLP Speech-Language Pathologist  Everlean Patterson 08/25/2019, 11:09 AM

## 2019-08-25 NOTE — ED Notes (Signed)
Pt cleaned and placed in hospital gown and socks. Pt c/o headache. New purwick applied. PT with pt at this time.

## 2019-08-25 NOTE — ED Triage Notes (Addendum)
Pt in via EMS from home after having left ER about 2-3 hours ago. Pt's family called EMS as pt more lethargic than normal and kept telling family "something doesn't feel right". Pt verbal initially with EMS then stopped answering their questions but remained otherwise responsive. 179/86 BP; HR 100; 96% RA; 188 BG. Temp 97.9 oral in ER now. Pt denied HA, CP, leg pain when asked by this RN.

## 2019-08-25 NOTE — ED Notes (Signed)
Pt not tracking Tom who is attempting to complete NIH stroke scale. Pt responds to pain. Pt attempts some actions Tom requests.

## 2019-08-25 NOTE — ED Notes (Signed)
Pt alert, oriented x3, disoriented to situation- pt states "[place]'m in [place]." Bruise noted on right hip approximately 3 inches in diameter. Pt cleaned and brief applied- purewick removed due to pt movement.

## 2019-08-25 NOTE — ED Notes (Signed)
Report given to neuro staff now. Last time known well 12:30am per daughter as this is right before she went to sleep. Upon waking and standing up pt no longer baseline and collapsed into daughters arms.

## 2019-08-25 NOTE — H&P (Signed)
History and Physical    [name] ZOX:[id number] DOB: 01/25/1956 DOA: 08/25/2019  PCP: Karie SchwalbeLetvak, Richard I, MD   Patient coming from: Home  I have personally briefly reviewed patient's [facility]  Chief Complaint: Altered mental status, slurred speech  HPI: Tami ArbourCynthia B Jones is a 64 y.o. 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  Patient had been seen earlier in the emergency room with a complaint of uncontrolled blood pressure and related stress.  She was treated and discharged only to return several hours later with the episode described above.  Most of the history is taken from the ER record and daughter as patient has not been answering questions.   ED Course: On arrival blood pressure was 152/88 with otherwise normal vitals.  Blood work was for the most part unremarkable.  Head CT showed no acute intracranial abnormality but showed old bilateral occipital infarcts and chronic ischemic microangiopathy.  Teleneurology consult was done from the ER.  Diagnosis made of acute CVA.  Alteplase not offered due to recent prior stroke less than 90 days ago.  MRI and EEG recommended.  Hospitalist consulted for admission.  Review of Systems: As per HPI otherwise all other systems on review of systems negative except for complaints of pain in bilateral upper extremities   Past Medical History:  Diagnosis Date  . Anxiety   . Asthma   . 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 Thad Rangereynolds    . Diabetes mellitus   . GERD (gastroesophageal reflux disease)   . Hyperlipidemia   . Hypertension     Past Surgical History:  Procedure Laterality Date  . CARDIOVASCULAR STRESS TEST  1/15   myoview. EF 60%  . CESAREAN SECTION    . ESOPHAGOGASTRODUODENOSCOPY  05/2005  . TONSILLECTOMY AND ADENOIDECTOMY       reports that she has never smoked. She has never used smokeless tobacco. She reports that she does not drink alcohol and does not use drugs.  Allergies  Allergen Reactions  . Citalopram Other (See Comments)    Feels odd  . Doxycycline Hyclate Nausea And Vomiting  . Erythromycin Other (See Comments)    Irritated stomach  . Montelukast Sodium Itching  . Nitrofurantoin Nausea And Vomiting  . Sulfa Antibiotics Nausea And Vomiting  . Tramadol Hcl Other (See Comments)    Feels odd  . Venlafaxine Hcl Other (See Comments)    Feels odd  . Amoxicillin-Pot Clavulanate Nausea And Vomiting  . Clarithromycin Rash     See ER note 02/2017    Family History  Problem Relation Age of Onset  . Coronary artery disease Mother   . Diabetes Mellitus II Mother   . Heart attack Mother [id number]  . Diabetes Mellitus II Maternal Grandmother       Prior to Admission medications   Medication Sig Start Date End Date Taking? Authorizing Provider  acetaminophen (TYLENOL) 325 MG tablet Take 650 mg by mouth every 6 (six) hours as needed.   Yes [provider]  ALPRAZolam Prudy Feeler(XANAX) 0.25 MG  tablet Take 1 tablet (0.25 mg total) by mouth at bedtime as needed for anxiety. 08/08/19  Yes Karie Schwalbe, MD  amLODipine (NORVASC) 10 MG tablet Take 1 tablet (10 mg total) by mouth daily. 08/08/19  Yes Tillman Abide I, MD  aspirin EC 81 MG tablet Take 81 mg by mouth every evening.    Yes [provider]  atorvastatin (LIPITOR) 80 MG tablet Take 1 tablet (80 mg total) by mouth daily. Patient taking differently: Take 80 mg by mouth every evening.  09/21/18 09/21/19 Yes Tillman Abide I, MD   glipiZIDE (GLUCOTROL XL) 5 MG 24 hr tablet TAKE 1 TABLET BY MOUTH EVERY DAY WITH BREAKFAST 07/30/19  Yes Karie Schwalbe, MD  lansoprazole (PREVACID) 30 MG capsule Take 1 capsule (30 mg total) by mouth daily at 12 noon. 08/17/18  Yes Karie Schwalbe, MD  lisinopril-hydrochlorothiazide (ZESTORETIC) 20-25 MG tablet Take 1 tablet by mouth every morning. 04/02/19  Yes Karie Schwalbe, MD  MELATONIN GUMMIES PO Take 1 tablet by mouth at bedtime as needed.   Yes [provider]  metoprolol succinate (TOPROL-XL) 25 MG 24 hr tablet Take 1 tablet (25 mg total) by mouth daily. 08/08/19 08/07/20 Yes Karie Schwalbe, MD    Physical Exam: Vitals:   08/25/19 0300 08/25/19 0308 08/25/19 0314 08/25/19 0322  BP: (!) 152/88   (!) 146/70  Pulse:  91 95 85  Resp: 15 16 14 16   Temp:      TempSrc:      SpO2:  99% 98% 98%     Vitals:   08/25/19 0300 08/25/19 0308 08/25/19 0314 08/25/19 0322  BP: (!) 152/88   (!) 146/70  Pulse:  91 95 85  Resp: 15 16 14 16   Temp:      TempSrc:      SpO2:  99% 98% 98%      Constitutional:    Lethargic but will answer questions slowly and softly.  Oriented x 3 . Not in any apparent distress HEENT:      Head: Normocephalic and atraumatic.         Eyes: PERLA, EOMI, Conjunctivae are normal. Sclera is non-icteric.       Mouth/Throat: Mucous membranes are moist.       Neck: Supple with no signs of meningismus. Cardiovascular: Regular rate and rhythm. No murmurs, gallops, or rubs. 2+ symmetrical distal pulses are present . No JVD. No LE edema Respiratory: Respiratory effort normal .Lungs sounds clear bilaterally. No wheezes, crackles, or rhonchi.  Gastrointestinal: Soft, non tender, and non distended with positive bowel sounds. No rebound or guarding. Genitourinary: No CVA tenderness. Musculoskeletal: Nontender with normal range of motion in all extremities. No cyanosis, or erythema of extremities. Neurologic: Speaking very slowly but speech is not slurred.  Face is symmetric. Moving all extremities.  Strength reduced in all extremities to 3 out of 5 Skin: Skin is warm, dry.  No rash or ulcers Psychiatric: Somewhat depressed affect.  Speech very slow   Labs on Admission: I have personally reviewed following labs and imaging studies  CBC: Recent Labs  Lab 08/24/19 1726 08/25/19 0238  WBC 7.2 7.2  NEUTROABS  --  3.9  HGB 14.0 14.7  HCT 41.4 43.1  MCV 90.8 89.6  PLT 255 245   Basic Metabolic Panel: Recent Labs  Lab 08/24/19 1726 08/25/19 0238  NA 137 135  K 3.7 4.0  CL 101 97*  CO2 21* 25  GLUCOSE 136* 235*  BUN 22 23  CREATININE 1.16* 1.33*  CALCIUM 9.0 9.5   GFR: Estimated Creatinine Clearance: 52.6 mL/min (A) (by C-G formula based on SCr of 1.33 mg/dL (H)). Liver Function Tests: Recent Labs  Lab 08/25/19 0238  AST 24  ALT 29  ALKPHOS 88  BILITOT 0.7  PROT 8.0  ALBUMIN 4.7   No results for input(s): LIPASE, AMYLASE in the last 168 hours. No results for input(s): AMMONIA in the last 168 hours. Coagulation Profile: Recent Labs  Lab 08/25/19 0238  INR 0.9   Cardiac Enzymes: No results for input(s): CKTOTAL, CKMB, CKMBINDEX, TROPONINI in the last 168 hours. BNP (last 3 results) No results for input(s): PROBNP in the last 8760 hours. HbA1C: No results for input(s): HGBA1C in the last 72 hours. CBG: Recent Labs  Lab 08/25/19 0244  GLUCAP 203*   Lipid Profile: No results for input(s): CHOL, HDL, LDLCALC, TRIG, CHOLHDL, LDLDIRECT in the last 72 hours. Thyroid Function Tests: No results for input(s): TSH, T4TOTAL, FREET4, T3FREE, THYROIDAB in the last 72 hours. Anemia Panel: No results for input(s): VITAMINB12, FOLATE, FERRITIN, TIBC, IRON, RETICCTPCT in the last 72 hours. Urine analysis:    Component Value Date/Time   COLORURINE STRAW (A) 08/25/2019 0405   APPEARANCEUR CLEAR (A) 08/25/2019 0405   LABSPEC 1.009 08/25/2019 0405   PHURINE 7.0 08/25/2019 0405   GLUCOSEU 150 (A) 08/25/2019 0405   HGBUR  NEGATIVE 08/25/2019 0405   HGBUR negative 02/05/2010 1336   BILIRUBINUR NEGATIVE 08/25/2019 0405   BILIRUBINUR Negative 01/15/2019 1508   KETONESUR NEGATIVE 08/25/2019 0405   PROTEINUR NEGATIVE 08/25/2019 0405   UROBILINOGEN 0.2 01/15/2019 1508   UROBILINOGEN 0.2 02/05/2010 1336   NITRITE NEGATIVE 08/25/2019 0405   LEUKOCYTESUR NEGATIVE 08/25/2019 0405    Radiological Exams on Admission: DG Chest 2 View  Result Date: 08/24/2019 CLINICAL DATA:  Chest ache. EXAM: CHEST - 2 VIEW COMPARISON:  March 11, 2019 FINDINGS: Stable cardiomegaly. The hila, mediastinum, lungs, and pleura are normal. IMPRESSION: No active cardiopulmonary disease. Electronically Signed   By: Gerome Sam III M.D   On: 08/24/2019 17:59   CT HEAD CODE STROKE WO CONTRAST`  Result Date: 08/25/2019 CLINICAL DATA:  Code stroke.  Encephalopathy EXAM: CT HEAD WITHOUT CONTRAST TECHNIQUE: Contiguous axial images were obtained from the base of the skull through the vertex without intravenous contrast. COMPARISON:  None. FINDINGS: Brain: There is no mass, hemorrhage or extra-axial collection. The size and configuration of the ventricles and extra-axial CSF spaces are normal. There is hypoattenuation of the periventricular white matter, most commonly indicating chronic ischemic microangiopathy. There are old bilateral occipital infarcts and old small vessel infarcts of the left caudate head and corona radiata. Vascular: No abnormal hyperdensity of the major intracranial arteries or dural venous sinuses. No intracranial atherosclerosis. Skull: The visualized skull base, calvarium and extracranial soft tissues are normal. Sinuses/Orbits: No fluid levels or advanced mucosal thickening of the visualized paranasal sinuses. No mastoid or middle ear effusion. The orbits are normal. ASPECTS [facility] Stroke Program Early CT Score) - Ganglionic level infarction (caudate, lentiform nuclei, internal capsule, insula, M1-M3 cortex): 7 - Supraganglionic  infarction (M4-M6 cortex): 3 Total score (0-10 with 10 being normal): 10 IMPRESSION: 1. No acute intracranial abnormality. 2. ASPECTS is 10. 3. 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 These results were called by telephone at the time of interpretation on 08/25/2019 at 3:10 am to provider [facility] , who verbally acknowledged these results. Electronically Signed   By: Deatra Robinson M.D.   On: 08/25/2019  03:10    EKG: Independently reviewed. 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    Acute CVA in the setting of history of CVA x2 with residual right-sided hemiparesis -Patient presents with sudden onset generalized weakness with altered mental status, dysarthria -Head CT negative -Seen by teleneurology with recommendation for MRI and EEG -Patient arrived within TPA window but alteplase not recommended due to CVA less than 90 days prior -Continuous cardiac monitoring to evaluate for arrhythmias. -Patient had unremarkable echo with EF 60% in April 2021 -Aspirin and statin.  Add Plavix for 30-day overlap and continuing with Plavix -Neurology consult -Permissive hypertension    Type 2 diabetes mellitus with other circulatory complications (HCC) -Sliding scale insulin    Essential hypertension, benign -Hold antihypertensives to allow for permissive hypertension    CKD (chronic kidney disease), stage IIIa -Renal function at baseline  History of anxiety and panic attacks -Take Xanax as needed    DVT prophylaxis: Lovenox  Code Status: full code   Family Communication: Daughter at bedside  disposition Plan: Back to previous home environment Consults called: Neurology Status: Observation    Andris Baumann MD [facility]     08/25/2019, 4:34 AM

## 2019-08-25 NOTE — Progress Notes (Signed)
[facility] Tele[facility]rology Consult Services   Date of Service:   08/25/2019 03:12:07  Impression:     .  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 Her exam is essentially nonfocal. Would not offer IV altepalse for reasons above but admit for MRI brain, would also obtain EEG.  Metrics: Last Known Well: 08/25/2019 00:30:00 [facility] Notification Time: 08/25/2019 03:12:07 Arrival Time: 08/25/2019 02:31:00 Stamp Time: 08/25/2019 03:12:07 Time First Login Attempt: 08/25/2019 03:19:46 Symptoms: syncope, unresponsive NIHSS Start Assessment Time: 08/25/2019 03:22:06 Patient is not a candidate for Thrombolytic. Thrombolytic Medical Decision: 08/25/2019 03:21:43 Patient was not deemed candidate for Thrombolytic because of following reasons: Stroke in last 3 months.  CT head showed no acute hemorrhage or acute core infarct. CT head was reviewed.  ED Physician notified of diagnostic impression and management plan on 08/25/2019 03:44:03  Advanced Imaging: CTA Head and Neck ordered  CTP ordered   Our recommendations are outlined below.  Recommendations:     .  Activate Stroke Protocol Admission/Order Set     .  Stroke/Telemetry Floor     .  Neuro Checks     .  Bedside Swallow Eval     .  DVT Prophylaxis     .  IV Fluids, Normal Saline     .  Head of Bed 30 Degrees     .  Euglycemia and Avoid Hyperthermia (PRN Acetaminophen)     .  continue DAPT with aspirin and plavix     .  continue lipitor     .  MRI brain and EEG  Routine Consultation with Inhouse Neurology for Follow up Care  Sign Out:     .  Discussed with Emergency Department  Provider    ------------------------------------------------------------------------------  History of Present Illness: Patient is a 64 year old Female.  Patient was brought by EMS for symptoms of syncope, unresponsive  Patient is a 64 yr old woman with hx of HTN, DMII, prior CVA in April (left frontal) who presents this evening after she collapsed and became less responsive. Per the daughter she came home late, went to bed but then found her with slurred speech. Was hospitalized with HTN and headache s/p xanax earlier today. MRI 4/24: left frontal three foci of ischemia in the MCA territory. Had speech deficits but resolved.  Last seen normal was within 4.5 hours. There is no history of hemorrhagic complications or intracranial hemorrhage. There is history of recent stroke.  Past Medical History:     . Hypertension     . Diabetes Mellitus     . Stroke     . CKD  Social History: Smoking: No Alcohol Use: No Drug Use: No  Review of System:  14 Points Review of Systems was performed and was negative except mentioned in HPI.  Anticoagulant use:  No  Antiplatelet use: aspirin and plavix    Examination: BP(152/88), Pulse(97), Blood Glucose(203) 1A: Level of Consciousness - Requires repeated stimulation to arouse + 2 1B: Ask Month and Age - 1 Question Right + 1 1C: Blink Eyes & Squeeze Hands - Performs Both Tasks + 0 2: Test Horizontal Extraocular Movements - Normal + 0 3: Test Visual Fields - No Visual Loss + 0 4: Test Facial Palsy (Use Grimace if Obtunded) - Normal symmetry +  0 5A: Test Left Arm Motor Drift - No Effort Against Gravity + 3 5B: Test Right Arm Motor Drift - No Effort Against Gravity + 3 6A: Test Left Leg Motor Drift - No Effort Against Gravity + 3 6B: Test Right Leg Motor Drift - No Effort Against Gravity + 3 7: Test Limb Ataxia (FNF/Heel-Shin) - No Ataxia + 0 8: Test Sensation - Complete Loss: Cannot Sense Being Touched At All + 2 9: Test Language/Aphasia  - Normal; No aphasia + 0 10: Test Dysarthria - Mild-Moderate Dysarthria: Slurring but can be understood + 1 11: Test Extinction/Inattention - No abnormality + 0  NIHSS Score: 18  Pre-Morbid Modified Rankin Scale: 2 Points = Slight disability; unable to carry out all previous activities, but able to look after own affairs without assistance   Patient/Family was informed the Neurology Consult would occur via TeleHealth consult by way of interactive audio and video telecommunications and consented to receiving care in this manner.   Patient is being evaluated for possible acute neurologic impairment and high probability of imminent or life-threatening deterioration. I spent total of 30 minutes providing care to this patient, including time for face to face visit via telemedicine, review of medical records, imaging studies and discussion of findings with providers, the patient and/or family.   Dr Natasha Bence Reannah Totten   [facility] [phone]  Case [id number]

## 2019-08-25 NOTE — ED Notes (Signed)
Code stroke called. Pt being taken to room 2 CT now.

## 2019-08-25 NOTE — Consult Note (Signed)
Reason for Consult:Slurred speech Requesting Physician: Renae Gloss  CC: Slurred speech  I have been asked by Dr. Renae Gloss to see this patient in consultation for slurred speech.  HPI: Tami Jones is an 64 y.o. 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  Patient had been seen earlier in the emergency room with a complaint of uncontrolled blood pressure and related stress.  She was treated and discharged only to return several hours later with the episode described above.  Patient now improved but not back to baseline.    Past Medical History:  Diagnosis Date  . Anxiety   . Asthma   . 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 Thad Ranger  . Diabetes mellitus   . GERD (gastroesophageal reflux disease)   . Hyperlipidemia   . Hypertension     Past Surgical History:  Procedure Laterality Date  . CARDIOVASCULAR STRESS TEST  1/15   myoview. EF 60%  . CESAREAN SECTION    . ESOPHAGOGASTRODUODENOSCOPY  05/2005  . TONSILLECTOMY AND ADENOIDECTOMY      Family History  Problem Relation Age of Onset  . Coronary artery disease Mother   . Diabetes Mellitus II Mother   . Heart attack Mother 21  . Diabetes Mellitus II Maternal Grandmother     Social History:  reports that she has never smoked. She has never used smokeless tobacco. She reports that she does not drink alcohol and does not use drugs.  Allergies  Allergen Reactions  . Citalopram Other (See Comments)    Feels odd  . Doxycycline Hyclate Nausea And  Vomiting  . Erythromycin Other (See Comments)    Irritated stomach  . Montelukast Sodium Itching  . Nitrofurantoin Nausea And Vomiting  . Sulfa Antibiotics Nausea And Vomiting  . Tramadol Hcl Other (See Comments)    Feels odd  . Venlafaxine Hcl Other (See Comments)    Feels odd  . Amoxicillin-Pot Clavulanate Nausea And Vomiting  . Clarithromycin Rash     See ER note 02/2017    Medications: I have reviewed the patient's current medications. Prior to Admission medications   Medication Sig Start Date End Date Taking? Authorizing Provider  acetaminophen (TYLENOL) 325 MG tablet Take 650 mg by mouth every 6 (six) hours as needed.   Yes [provider]  ALPRAZolam (XANAX) 0.25 MG tablet Take 1 tablet (0.25 mg total) by mouth at bedtime as needed for anxiety. 08/08/19  Yes Karie Schwalbe, MD  amLODipine (NORVASC) 10 MG tablet Take 1 tablet (10 mg total) by mouth daily. 08/08/19  Yes Tillman Abide I, MD  aspirin EC 81 MG tablet Take 81 mg by mouth every evening.    Yes [provider]  atorvastatin (LIPITOR) 80 MG tablet Take 1 tablet (80 mg total) by mouth daily. Patient taking differently: Take 80 mg by mouth every evening.  09/21/18 09/21/19 Yes Tillman Abide I, MD  glipiZIDE (GLUCOTROL XL) 5 MG 24 hr tablet TAKE 1 TABLET BY MOUTH EVERY DAY WITH BREAKFAST 07/30/19  Yes Karie Schwalbe, MD  lansoprazole (PREVACID) 30 MG  capsule Take 1 capsule (30 mg total) by mouth daily at 12 noon. 08/17/18  Yes Karie SchwalbeLetvak, Richard I, MD  lisinopril-hydrochlorothiazide (ZESTORETIC) 20-25 MG tablet Take 1 tablet by mouth every morning. 04/02/19  Yes Karie SchwalbeLetvak, Richard I, MD  MELATONIN GUMMIES PO Take 1 tablet by mouth at bedtime as needed.   Yes [provider]  metoprolol succinate (TOPROL-XL) 25 MG 24 hr tablet Take 1 tablet (25 mg total) by mouth daily. 08/08/19 08/07/20 Yes Karie SchwalbeLetvak, Richard I, MD    ROS: History obtained from the patient  General ROS:  Fatigue, difficulty  sleeping Psychological ROS: negative for - behavioral disorder, hallucinations, memory difficulties, mood swings or suicidal ideation Ophthalmic ROS: negative for - blurry vision, double vision, eye pain or loss of vision ENT ROS: negative for - epistaxis, nasal discharge, oral lesions, sore throat, tinnitus or vertigo Allergy and Immunology ROS: negative for - hives or itchy/watery eyes Hematological and Lymphatic ROS: negative for - bleeding problems, bruising or swollen lymph nodes Endocrine ROS: negative for - galactorrhea, hair pattern changes, polydipsia/polyuria or temperature intolerance Respiratory ROS: negative for - cough, hemoptysis, shortness of breath or wheezing Cardiovascular ROS: negative for - chest pain, dyspnea on exertion, edema or irregular heartbeat Gastrointestinal ROS: negative for - abdominal pain, diarrhea, hematemesis, nausea/vomiting or stool incontinence Genito-Urinary ROS: negative for - dysuria, hematuria, incontinence or urinary frequency/urgency Musculoskeletal ROS: negative for - joint swelling or muscular weakness Neurological ROS: as noted in HPI Dermatological ROS: negative for rash and skin lesion changes  Physical Examination: Blood pressure (!) 145/80, pulse 77, temperature 97.9 F (36.6 C), temperature source Oral, resp. rate 16, SpO2 100 %.  HEENT-  Normocephalic, no lesions, without obvious abnormality.  Normal external eye and conjunctiva.  Normal TM's bilaterally.  Normal auditory canals and external ears. Normal external nose, mucus membranes and septum.  Normal pharynx. Cardiovascular- S1, S2 normal, pulses palpable throughout   Lungs- chest clear, no wheezing, rales, normal symmetric air entry Abdomen- soft, non-tender; bowel sounds normal; no masses,  no organomegaly Extremities- no edema Lymph-no adenopathy palpable Musculoskeletal-no joint tenderness, deformity or swelling Skin-warm and dry, no hyperpigmentation, vitiligo, or suspicious  lesions  Neurological Examination   Mental Status: Alert and awake.  Some mild confusion noted.  Speech slow and at times with word finding difficulties.  Able to follow commands without difficulty. Cranial Nerves: II: Discs flat bilaterally; Visual fields grossly normal, pupils equal, round, reactive to light and accommodation III,IV, VI: ptosis not present, extra-ocular motions intact bilaterally V,VII: smile symmetric, facial light touch sensation normal bilaterally VIII: hearing normal bilaterally IX,X: gag reflex present XI: bilateral shoulder shrug XII: midline tongue extension Motor: Right : Upper extremity   3+/5    Left:     Upper extremity   4/5  Lower extremity   3-/5     Lower extremity   3/5 Tone and bulk:normal tone throughout; no atrophy noted Sensory: Pinprick and light touch intact throughout, bilaterally Deep Tendon Reflexes: Symmetric throughout Plantars: Right: mute   Left: mute Cerebellar: Unable to perform due to generalized weakness Gait: not tested due to safety concerns   Laboratory Studies:   Basic Metabolic Panel: Recent Labs  Lab 08/24/19 1726 08/25/19 0238  NA 137 135  K 3.7 4.0  CL 101 97*  CO2 21* 25  GLUCOSE 136* 235*  BUN 22 23  CREATININE 1.16* 1.33*  CALCIUM 9.0 9.5    Liver Function Tests: Recent Labs  Lab 08/25/19 0238  AST 24  ALT 29  ALKPHOS 88  BILITOT 0.7  PROT 8.0  ALBUMIN 4.7   No results for input(s): LIPASE, AMYLASE in the last 168 hours. No results for input(s): AMMONIA in the last 168 hours.  CBC: Recent Labs  Lab 08/24/19 1726 08/25/19 0238  WBC 7.2 7.2  NEUTROABS  --  3.9  HGB 14.0 14.7  HCT 41.4 43.1  MCV 90.8 89.6  PLT 255 245    Cardiac Enzymes: No results for input(s): CKTOTAL, CKMB, CKMBINDEX, TROPONINI in the last 168 hours.  BNP: Invalid input(s): POCBNP  CBG: Recent Labs  Lab 08/25/19 0244 08/25/19 1005  GLUCAP 203* 167*    Microbiology: Results for orders placed or performed  during the hospital encounter of 08/25/19  SARS Coronavirus 2 by RT PCR (hospital order, performed in [facility] hospital lab) Nasopharyngeal     Status: None   Collection Time: 08/25/19  4:05 AM   Specimen: Nasopharyngeal  Result Value Ref Range Status   SARS Coronavirus 2 NEGATIVE NEGATIVE Final    Comment: (NOTE) SARS-CoV-2 target nucleic acids are NOT DETECTED.  The SARS-CoV-2 RNA is generally detectable in upper and lower respiratory specimens during the acute phase of infection. The lowest concentration of SARS-CoV-2 viral copies this assay can detect is 250 copies / mL. A negative result does not preclude SARS-CoV-2 infection and should not be used as the sole basis for treatment or other patient management decisions.  A negative result may occur with improper specimen collection / handling, submission of specimen other than nasopharyngeal swab, presence of viral mutation(s) within the areas targeted by this assay, and inadequate number of viral copies (<250 copies / mL). A negative result must be combined with clinical observations, patient history, and epidemiological information.  Fact Sheet for Patients:   BoilerBrush.com.cy  Fact Sheet for Healthcare Providers: https://pope.com/  This test is not yet approved or  cleared by the [place] FDA and has been authorized for detection and/or diagnosis of SARS-CoV-2 by FDA under an Emergency Use Authorization (EUA).  This EUA will remain in effect (meaning this test can be used) for the duration of the COVID-19 declaration under Section 564(b)(1) of the Act, 21 U.S.C. section 360bbb-3(b)(1), unless the authorization is terminated or revoked sooner.  Performed at [facility], [address] [place], [place] [address]     Coagulation Studies: Recent Labs    08/25/19 0238  LABPROT 11.8  INR 0.9    Urinalysis:  Recent Labs  Lab 08/25/19 0405   COLORURINE STRAW*  LABSPEC 1.009  PHURINE 7.0  GLUCOSEU 150*  HGBUR NEGATIVE  BILIRUBINUR NEGATIVE  KETONESUR NEGATIVE  PROTEINUR NEGATIVE  NITRITE NEGATIVE  LEUKOCYTESUR NEGATIVE    Lipid Panel:     Component Value Date/Time   CHOL 233 (H) 08/25/2019 0238   CHOL 238 (H) 02/17/2017 1300   TRIG 151 (H) 08/25/2019 0238   HDL 44 08/25/2019 0238   HDL 39 (L) 02/17/2017 1300   CHOLHDL 5.3 08/25/2019 0238   VLDL 30 08/25/2019 0238   LDLCALC 159 (H) 08/25/2019 0238   LDLCALC 164 (H) 02/17/2017 1300    HgbA1C:  Lab Results  Component Value Date   HGBA1C 8.3 (H) 06/10/2019    Urine Drug Screen:      Component Value Date/Time   LABOPIA NONE DETECTED 08/25/2019 0405   COCAINSCRNUR NONE DETECTED 08/25/2019 0405   LABBENZ NONE DETECTED 08/25/2019 0405   AMPHETMU NONE DETECTED 08/25/2019 0405   THCU NONE DETECTED 08/25/2019 0405   LABBARB NONE DETECTED  08/25/2019 0405    Alcohol Level:  Recent Labs  Lab 08/25/19 0238  ETH <10    Other results: EKG: sinus rhythm at 79 bpm  Imaging: CT ANGIO HEAD W OR WO CONTRAST  Result Date: 08/25/2019 CLINICAL DATA:  Stroke follow-up EXAM: CT ANGIOGRAPHY HEAD AND NECK TECHNIQUE: Multidetector CT imaging of the head and neck was performed using the standard protocol during bolus administration of intravenous contrast. Multiplanar CT image reconstructions and MIPs were obtained to evaluate the vascular anatomy. Carotid stenosis measurements (when applicable) are obtained utilizing NASCET criteria, using the distal internal carotid diameter as the denominator. CONTRAST:  23mL OMNIPAQUE IOHEXOL 350 MG/ML SOLN COMPARISON:  None. FINDINGS: CTA NECK FINDINGS SKELETON: There is no bony spinal canal stenosis. No lytic or blastic lesion. OTHER NECK: Normal pharynx, larynx and major salivary glands. No cervical lymphadenopathy. Unremarkable thyroid gland. UPPER CHEST: No pneumothorax or pleural effusion. No nodules or masses. AORTIC ARCH: There is  mild calcific atherosclerosis of the aortic arch. There is no aneurysm, dissection or hemodynamically significant stenosis of the visualized portion of the aorta. Conventional 3 vessel aortic branching pattern. The visualized proximal subclavian arteries are widely patent. RIGHT CAROTID SYSTEM: Normal without aneurysm, dissection or stenosis. LEFT CAROTID SYSTEM: Normal without aneurysm, dissection or stenosis. VERTEBRAL ARTERIES: Left dominant configuration. The right vertebral artery is diffusely diminutive. The V1 segment is not visible. The left vertebral artery origin is normal. CTA HEAD FINDINGS POSTERIOR CIRCULATION: --Vertebral arteries: The right vertebral artery terminates in PICA. Left V4 segment is normal. --Inferior cerebellar arteries: Normal. --Basilar artery: Normal. --Superior cerebellar arteries: Normal. --Posterior cerebral arteries (PCA): There is severe stenosis of the right P1 segment and proximal P2 segment. There is multifocal severe stenosis of the left P2 segment. The distal posterior cerebral arteries are both patent. ANTERIOR CIRCULATION: --Intracranial internal carotid arteries: Atherosclerotic calcification of the internal carotid arteries at the skull base without hemodynamically significant stenosis. --Anterior cerebral arteries (ACA): There is severe stenosis of the proximal right A1 segment. The anterior communicating artery is patent. Otherwise, both ACAs are normal. --Middle cerebral arteries (MCA): Normal. VENOUS SINUSES: As permitted by contrast timing, patent. ANATOMIC VARIANTS: None Review of the MIP images confirms the above findings. IMPRESSION: 1. No emergent large vessel occlusion. 2. 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 Multifocal severe left P2 segment stenosis. 3. Severe stenosis of the proximal right anterior cerebral artery A1 segment. 4. Diffusely diminutive right vertebral artery, which terminates in PICA. 5.  Aortic Atherosclerosis (ICD10-I70.0). Electronically Signed   By: Deatra Robinson M.D.   On: 08/25/2019 04:49   DG Chest 2 View  Result Date: 08/24/2019 CLINICAL DATA:  Chest ache. EXAM: CHEST - 2 VIEW COMPARISON:  March 11, 2019 FINDINGS: Stable cardiomegaly. The hila, mediastinum, lungs, and pleura are normal. IMPRESSION: No active cardiopulmonary disease. Electronically Signed   By: Gerome Sam III M.D   On: 08/24/2019 17:59   CT ANGIO NECK W OR WO CONTRAST  Result Date: 08/25/2019 CLINICAL DATA:  Stroke follow-up EXAM: CT ANGIOGRAPHY HEAD AND NECK TECHNIQUE: Multidetector CT imaging of the head and neck was performed using the standard protocol during bolus administration of intravenous contrast. Multiplanar CT image reconstructions and MIPs were obtained to evaluate the vascular anatomy. Carotid stenosis measurements (when applicable) are obtained utilizing NASCET criteria, using the distal internal carotid diameter as the denominator. CONTRAST:  58mL OMNIPAQUE IOHEXOL 350 MG/ML SOLN COMPARISON:  None. FINDINGS: CTA NECK FINDINGS SKELETON: There is  no bony spinal canal stenosis. No lytic or blastic lesion. OTHER NECK: Normal pharynx, larynx and major salivary glands. No cervical lymphadenopathy. Unremarkable thyroid gland. UPPER CHEST: No pneumothorax or pleural effusion. No nodules or masses. AORTIC ARCH: There is mild calcific atherosclerosis of the aortic arch. There is no aneurysm, dissection or hemodynamically significant stenosis of the visualized portion of the aorta. Conventional 3 vessel aortic branching pattern. The visualized proximal subclavian arteries are widely patent. RIGHT CAROTID SYSTEM: Normal without aneurysm, dissection or stenosis. LEFT CAROTID SYSTEM: Normal without aneurysm, dissection or stenosis. VERTEBRAL ARTERIES: Left dominant configuration. The right vertebral artery is diffusely diminutive. The V1 segment is not visible. The left vertebral artery origin is normal. CTA  HEAD FINDINGS POSTERIOR CIRCULATION: --Vertebral arteries: The right vertebral artery terminates in PICA. Left V4 segment is normal. --Inferior cerebellar arteries: Normal. --Basilar artery: Normal. --Superior cerebellar arteries: Normal. --Posterior cerebral arteries (PCA): There is severe stenosis of the right P1 segment and proximal P2 segment. There is multifocal severe stenosis of the left P2 segment. The distal posterior cerebral arteries are both patent. ANTERIOR CIRCULATION: --Intracranial internal carotid arteries: Atherosclerotic calcification of the internal carotid arteries at the skull base without hemodynamically significant stenosis. --Anterior cerebral arteries (ACA): There is severe stenosis of the proximal right A1 segment. The anterior communicating artery is patent. Otherwise, both ACAs are normal. --Middle cerebral arteries (MCA): Normal. VENOUS SINUSES: As permitted by contrast timing, patent. ANATOMIC VARIANTS: None Review of the MIP images confirms the above findings. IMPRESSION: 1. No emergent large vessel occlusion. 2. 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 Multifocal severe left P2 segment stenosis. 3. Severe stenosis of the proximal right anterior cerebral artery A1 segment. 4. Diffusely diminutive right vertebral artery, which terminates in PICA. 5. Aortic Atherosclerosis (ICD10-I70.0). Electronically Signed   By: Deatra Robinson M.D.   On: 08/25/2019 04:49   MR BRAIN WO CONTRAST  Result Date: 08/25/2019 CLINICAL DATA:  Encephalopathy EXAM: MRI HEAD WITHOUT CONTRAST TECHNIQUE: Multiplanar, multiecho pulse sequences of the brain and surrounding structures were obtained without intravenous contrast. COMPARISON:  08/01/2019 FINDINGS: BRAIN: No acute infarct, acute hemorrhage or extra-axial collection. Unchanged appearance of multiple old infarcts, including both occipital lobes and in the left basal ganglia. Normal volume of CSF spaces.  Single focus of chronic microhemorrhage in the left periatrial region, unchanged. There is multifocal periventricular white matter hyperintensity, most often a result of chronic microvascular ischemia. Normal midline structures. VASCULAR: Major flow voids are preserved. SKULL AND UPPER CERVICAL SPINE: Normal calvarium and skull base. Visualized upper cervical spine and soft tissues are normal. SINUSES/ORBITS: No paranasal sinus fluid levels or advanced mucosal thickening. No mastoid or middle ear effusion. Normal orbits. IMPRESSION: 1. No acute intracranial process. 2. Unchanged appearance of multiple old infarcts and findings of chronic small vessel ischemia. Electronically Signed   By: Deatra Robinson M.D.   On: 08/25/2019 05:41   CT HEAD CODE STROKE WO CONTRAST`  Result Date: 08/25/2019 CLINICAL DATA:  Code stroke.  Encephalopathy EXAM: CT HEAD WITHOUT CONTRAST TECHNIQUE: Contiguous axial images were obtained from the base of the skull through the vertex without intravenous contrast. COMPARISON:  None. FINDINGS: Brain: There is no mass, hemorrhage or extra-axial collection. The size and configuration of the ventricles and extra-axial CSF spaces are normal. There is hypoattenuation of the periventricular white matter, most commonly indicating chronic ischemic microangiopathy. There are old bilateral occipital infarcts and old small vessel infarcts of the left caudate head and  corona radiata. Vascular: No abnormal hyperdensity of the major intracranial arteries or dural venous sinuses. No intracranial atherosclerosis. Skull: The visualized skull base, calvarium and extracranial soft tissues are normal. Sinuses/Orbits: No fluid levels or advanced mucosal thickening of the visualized paranasal sinuses. No mastoid or middle ear effusion. The orbits are normal. ASPECTS [facility] Stroke Program Early CT Score) - Ganglionic level infarction (caudate, lentiform nuclei, internal capsule, insula, M1-M3 cortex): 7 -  Supraganglionic infarction (M4-M6 cortex): 3 Total score (0-10 with 10 being normal): 10 IMPRESSION: 1. No acute intracranial abnormality. 2. ASPECTS is 10. 3. 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 These results were called by telephone at the time of interpretation on 08/25/2019 at 3:10 am to provider [facility] , who verbally acknowledged these results. Electronically Signed   By: Deatra Robinson M.D.   On: 08/25/2019 03:10     Assessment/Plan: 64 y.o. 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  Patient had been seen earlier in the emergency room with a complaint of uncontrolled blood pressure and related stress.  She was treated and discharged only to return several hours later with the episode described above.  Patient now improved but not back to baseline.  MRI of the brain personally reviewed and shows no acute changes.  CTA shows severe right P1, proximal P2 and multifocal P2 segment, proximal A1 stenosis.  Right vertebral is small.  Since patient was in earlier for hypertension can not rule out overcorrection with resultant focal hypoperfusion.  Seizure on the differential as well.    Recommendations: 1. Orthostatic vitals 2. EEG  3.  Continue ASA and statin 4. Telemetry.  If unremarkable during this hospitalization may consider prolonged monitoring on an outpatient basis.    Thana Farr, MD Neurology [phone] 08/25/2019, 12:21 PM

## 2019-08-25 NOTE — ED Notes (Addendum)
Rainbow sent to lab. Daughter at bedside. Pt will state her name, that she doesn't know where she is, and that she "feels lifeless". Pt will move tongue side to side when prompted by Elijah Birk; pt will not attempt any other portion of Stroke scale currently. Pt with generalized weakness; won't attempt to move any of her extremities at this time.

## 2019-08-25 NOTE — Evaluation (Signed)
Occupational Therapy Evaluation Patient Details Name: Tami Jones MRN: [id number] DOB: 1955/07/18 Today's Date: 08/25/2019    History of Present Illness  Tami Jones is a 64 y.o. 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  Patient had been seen earlier in the emergency room with a complaint of uncontrolled blood pressure and related stress.  She was treated and discharged only to return several hours later with the episode described above.   Clinical Impression   Tami Jones was seen for OT evaluation this date. Prior to hospital admission, pt was MOD I for mobility and ADLs using QC including getting mail daily. Pt lives c daughter and 2 grand children (6yo and 3yo). Pt presents to acute OT demonstrating impaired ADL performance and functional mobility 2/2 decreased LB acess, functional strength/ROM/balance deficits, impaired use of dominant RUE, and decreased activity tolerance. Pt currently requires SETUP for self-feeding at bed level. MAX A for LBD. MOD A doff shirt and don gown at bed level. Anticipate assist needed for ADL t/fs - pt reports too fatigued to attempt mobility this date. Pt demonstrated visual difficulty tracking horizontally during H-test - unclear fatigue vs visual deficit. Pt would benefit from skilled OT to address noted impairments and functional limitations (see below for any additional details) in order to maximize safety and independence while minimizing falls risk and caregiver burden. Upon hospital discharge,  recommend STR to maximize pt safety and return to PLOF.      Follow Up Recommendations  SNF (may progress)    Equipment Recommendations       Recommendations for Other Services       Precautions / Restrictions Precautions Precautions: Fall Restrictions Weight Bearing Restrictions: No      Mobility Bed Mobility Overal bed mobility: Needs Assistance             General bed mobility comments: Pt declined bed mobility this date reporting general malaise  Transfers                      Balance                                           ADL either performed or assessed with clinical judgement   ADL Overall ADL's : Needs assistance/impaired                                       General ADL Comments: SETUP self-feeding. MAX A LBD bed level. MOD A doff shirt and don gown at bed level. Anticipate assist needed for ADL t/fs - pt reports too fatigued to attempt mobility     Vision   Additional Comments: Difficulty tracking horizontally (fatigue vs pain from headache)     Perception     Praxis      Pertinent Vitals/Pain Pain Assessment: Faces Faces Pain Scale: Hurts little more Pain Location:  Headache. General malaise Pain Descriptors / Indicators: Headache Pain Intervention(s): Limited activity within patient's tolerance     Hand Dominance Right   Extremity/Trunk Assessment Upper Extremity Assessment Upper Extremity Assessment: RUE deficits/detail;LUE deficits/detail RUE Deficits / Details: 3/5 grip. Shoulder limited AROM ~80*. Elbow flexion WFL. MMT grossly 3/5 LUE Deficits / Details: LUE AROM grossly WFL and MMT 4-/5    Lower Extremity Assessment Lower Extremity Assessment: Generalized weakness (B heel slides at bed level c L better than R)       Communication Communication Communication: No difficulties   Cognition Arousal/Alertness: Lethargic Behavior During Therapy: WFL for tasks  assessed/performed Overall Cognitive Status: Within Functional Limits for tasks assessed                                 General Comments: Pt awake and answering questions appropriately until asked to engage in mobility. Intermittent difficulty tracking horizontally visually - unclear if fatigue vs deficit    General Comments  Vitals stable t/o     Exercises Exercises: Other exercises Other Exercises Other Exercises: Pt and family educated re: OT role, D<E recs, d/c recs, falls prevention Other Exercises: LBD, UBD, self-feeding    Shoulder Instructions      Home Living Family/patient expects to be discharged to:: Private residence Living Arrangements: Children Available Help at Discharge: Family;Available 24 hours/day Type of Home: House Home Access: Stairs to enter [employer] of Steps: 3 Entrance Stairs-Rails: Left Home Layout: One level     Bathroom Shower/Tub: Chief Strategy Officer: Standard     Home Equipment: Environmental consultant - 2 wheels;Cane - quad;Bedside commode;Grab bars - tub/shower;Shower seat   Additional Comments: Daughter home 24/7 c her 2 children (6yo and 3yo)      Prior Functioning/Environment Level of Independence: Needs assistance  Gait / Transfers Assistance Needed: MOD I with QC, does not drive but is able to do walk to get the mail. Dtr reports last ~month pt has decreased level of activity  ADL's / Homemaking Assistance Needed: Dtr at bed side reports pt MOD I for ADLs.             OT Problem List: Decreased strength;Decreased range of motion;Decreased activity tolerance;Impaired balance (sitting and/or standing);Impaired vision/perception;Decreased safety awareness;Decreased knowledge of use of DME or AE      OT Treatment/Interventions: Self-care/ADL training;Therapeutic exercise;Neuromuscular education;Energy conservation;DME and/or AE instruction;Therapeutic activities;Visual/perceptual  remediation/compensation;Patient/family education;Balance training    OT Goals(Current goals can be found in the care plan section) Acute Rehab OT Goals Patient Stated Goal: To return to PLOF  OT Goal Formulation: With patient/family Time For Goal Achievement: 09/08/19 Potential to Achieve Goals: Good ADL Goals Pt Will Perform Grooming: with set-up;sitting Pt Will Perform Lower Body Dressing: with min assist;sitting/lateral leans Pt Will Transfer to Toilet: stand pivot transfer;with min assist;bedside commode (c LRAD PRN)  OT Frequency: Min 1X/week   Barriers to D/C: Inaccessible home environment          Co-evaluation              AM-PAC OT "6 Clicks" Daily Activity     Outcome Measure Help from another person eating meals?: None Help from another person taking care of personal grooming?: A Little Help from another person toileting, which includes using toliet, bedpan, or urinal?: A Little Help from another person bathing (including washing, rinsing, drying)?: A Lot Help from another person to put on and taking off regular  upper body clothing?: A Lot Help from another person to put on and taking off regular lower body clothing?: A Lot 6 Click Score: 16   End of Session Nurse Communication: Mobility status  Activity Tolerance: Patient limited by fatigue Patient left: in bed;with call bell/phone within reach;with family/visitor present  OT Visit Diagnosis: Unsteadiness on feet (R26.81);Other abnormalities of gait and mobility (R26.89);Muscle weakness (generalized) (M62.81)                Time: 6144-3154 OT Time Calculation (min): 17 min Charges:  OT General Charges $OT Visit: 1 Visit OT Evaluation $OT Eval Moderate Complexity: 1 Mod OT Treatments $Self Care/Home Management : 8-22 mins  Kathie Dike, M.S. OTR/L  08/25/19, 5:10 PM

## 2019-08-25 NOTE — ED Notes (Addendum)
EDP Tami Jones to bedside. Pt's daughter reports pt was fine after going home from ER earlier but then stood up stating she didn't feel right and collapsed in daughters arms. Daughter denies pt's head hitting anything. Daughter denies pt taking anything additional once home. States pt used bathroom before going to bed and then this episode happened. Daughter states "she has been feeling odd like this for the last two weeks but this is completely different". History of previous strokes per daughter.

## 2019-08-25 NOTE — ED Notes (Signed)
Pt provided hospital bed by ED tech.

## 2019-08-25 NOTE — ED Notes (Signed)
EKG completed

## 2019-08-25 NOTE — ED Provider Notes (Signed)
[facility] Emergency Department Provider Note  ____________________________________________  Time seen: Approximately 3:41 AM  I have reviewed the triage vital signs and the nursing notes.   HISTORY  Chief Complaint Altered Mental Status  Level 5 caveat:  Portions of the history and physical were unable to be obtained due to AMS   HPI Tami Jones is a 64 y.o. female the history of CVA with right-sided weakness, asthma, anxiety, diabetes, hypertension, hyperlipidemia, chronic kidney disease who presents for evaluation of altered mental status.  Patient was seen earlier here today for anxiety and hypertension.  She felt better after Xanax which is something that she has taken in the past.  She went home with her daughter.  According to the daughter patient was doing well when she got home.  Around 1 AM, patient told her daughter that something was not right and then went limp.  Daughter was able to catch her so she did not injure herself.  When EMS arrived patient was answering questions and moving all 4 extremities however in route to the hospital she stopped answering questions and moving.  Upon arrival to the ED, she answered a few questions yes or no to the nurse and denied any pain.  Upon my evaluation,  patient will not answer any questions or follow any commands.  She is awake.  Daughter is at bedside and reports the patient has never done anything like that before.   Past Medical History:  Diagnosis Date  . Anxiety   . Asthma   . 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 Thad Ranger  . Diabetes mellitus   . GERD (gastroesophageal reflux disease)   . Hyperlipidemia   . Hypertension     Patient Active Problem List   Diagnosis Date Noted  . History of CVA with residual deficit 08/25/2019  . Hypertensive emergency, no CHF 08/01/2019  . Elevated troponin 08/01/2019  . Hypertension   . HLD (hyperlipidemia)   . Type II  diabetes mellitus with renal manifestations (HCC)   . CKD (chronic kidney disease), stage IIIa   . Acute CVA (cerebrovascular accident) (HCC)   . Depression   . Gross hematuria 01/15/2019  . Dysuria 01/15/2019  . Right shoulder pain 12/04/2018  . Neuropathic pain, arm 10/26/2018  . MDD (major depressive disorder), single episode, moderate (HCC) 09/21/2018  . Hemiparesis of right dominant side (HCC) 08/17/2018  . SOB (shortness of breath) 03/08/2017  . Vestibular dizziness 01/21/2016  . Tick bite 07/15/2015  . Cerebrovascular disease 07/17/2014  . Preventative health care 06/25/2014  . Right sided sciatica 03/08/2013  . Chest pain 01/24/2012  . MENOPAUSAL SYNDROME 09/10/2008  . Mood disorder (HCC) 09/15/2007  . Essential hypertension, benign 08/10/2006  . Type 2 diabetes mellitus with other circulatory complications (HCC) 06/17/2006  . HYPERCHOLESTEROLEMIA 06/17/2006  . GERD 06/17/2006    Past Surgical History:  Procedure Laterality Date  . CARDIOVASCULAR STRESS TEST  1/15   myoview. EF 60%  . CESAREAN SECTION    . ESOPHAGOGASTRODUODENOSCOPY  05/2005  . TONSILLECTOMY AND ADENOIDECTOMY      Prior to Admission medications   Medication Sig Start Date End Date Taking? Authorizing Provider  acetaminophen (TYLENOL) 325 MG tablet Take 650 mg by mouth every 6 (six) hours as needed.   Yes [provider]  ALPRAZolam (XANAX) 0.25 MG tablet Take 1 tablet (0.25 mg total) by mouth at bedtime as needed for anxiety. 08/08/19  Yes Karie Schwalbe, MD  amLODipine (NORVASC) 10 MG tablet Take 1 tablet (10 mg total) by mouth daily. 08/08/19  Yes Tillman Abide I, MD  aspirin EC 81 MG tablet Take 81 mg by mouth every evening.    Yes [provider]  atorvastatin (LIPITOR) 80 MG tablet Take 1 tablet (80 mg total) by mouth daily. Patient taking differently: Take 80 mg by mouth every evening.  09/21/18 09/21/19 Yes Tillman Abide I, MD  glipiZIDE (GLUCOTROL XL) 5 MG 24 hr tablet TAKE  1 TABLET BY MOUTH EVERY DAY WITH BREAKFAST 07/30/19  Yes Karie Schwalbe, MD  lansoprazole (PREVACID) 30 MG capsule Take 1 capsule (30 mg total) by mouth daily at 12 noon. 08/17/18  Yes Karie Schwalbe, MD  lisinopril-hydrochlorothiazide (ZESTORETIC) 20-25 MG tablet Take 1 tablet by mouth every morning. 04/02/19  Yes Karie Schwalbe, MD  MELATONIN GUMMIES PO Take 1 tablet by mouth at bedtime as needed.   Yes [provider]  metoprolol succinate (TOPROL-XL) 25 MG 24 hr tablet Take 1 tablet (25 mg total) by mouth daily. 08/08/19 08/07/20 Yes Karie Schwalbe, MD    Allergies Citalopram, Doxycycline hyclate, Erythromycin, Montelukast sodium, Nitrofurantoin, Sulfa antibiotics, Tramadol hcl, Venlafaxine hcl, Amoxicillin-pot clavulanate, and Clarithromycin  Family History  Problem Relation Age of Onset  . Coronary artery disease Mother   . Diabetes Mellitus II Mother   . Heart attack Mother 58  . Diabetes Mellitus II Maternal Grandmother     Social History Social History   Tobacco Use  . Smoking status: Never Smoker  . Smokeless tobacco: Never Used  Vaping Use  . Vaping Use: Never used  Substance Use Topics  . Alcohol use: No    Alcohol/week: 0.0 standard drinks    Comment: wine occassionally  . Drug use: No    Review of Systems  Constitutional: Negative for fever. + AMS Eyes: Negative for visual changes. ENT: Negative for sore throat. Neck: No neck pain  Cardiovascular: Negative for chest pain. Respiratory: Negative for shortness of breath. Gastrointestinal: Negative for abdominal pain, vomiting or diarrhea. Genitourinary: Negative for dysuria. Musculoskeletal: Negative for back pain. Skin: Negative for rash. Neurological: Negative for headaches, weakness or numbness. Psych: No SI or HI  ____________________________________________   PHYSICAL EXAM:  VITAL SIGNS: ED Triage Vitals  Enc Vitals Group     BP 08/25/19 0300 (!) 152/88     Pulse Rate 08/25/19  0238 97     Resp 08/25/19 0238 16     Temp 08/25/19 0245 97.9 F (36.6 C)     Temp Source 08/25/19 0245 Oral     SpO2 08/25/19 0235 99 %     Weight --      Height --      Head Circumference --      Peak Flow --      Pain Score 08/25/19 0246 0     Pain Loc --      Pain Edu? --      Excl. in GC? --     Constitutional: Awake, holding her head to the left, not answering questions HEENT:      Head: Normocephalic and atraumatic.         Eyes: Conjunctivae are normal. Sclera is non-icteric.       Mouth/Throat: Mucous membranes are moist.       Neck: Supple with no signs of meningismus. Cardiovascular: Regular rate and rhythm. No murmurs, gallops, or rubs.  Respiratory: Normal respiratory effort. Lungs are clear to auscultation bilaterally.  Gastrointestinal:  Soft, non tender. Musculoskeletal:  No edema, cyanosis, or erythema of extremities. Neurologic: Aphasic. Face is symmetric.  Opens her eyes spontaneously and looks around, not answering any questions, when asked to squeeze my hand patient is able to flicker her fingers but not really squeeze my hand.  She is not moving her lower extremities.   Skin: Skin is warm, dry and intact. No rash noted.  ____________________________________________   LABS (all labs ordered are listed, but only abnormal results are displayed)  Labs Reviewed  GLUCOSE, CAPILLARY - Abnormal; Notable for the following components:      Result Value   Glucose-Capillary 203 (*)    All other components within normal limits  COMPREHENSIVE METABOLIC PANEL - Abnormal; Notable for the following components:   Chloride 97 (*)    Glucose, Bld 235 (*)    Creatinine, Ser 1.33 (*)    GFR calc non Af Amer 43 (*)    GFR calc Af Amer 50 (*)    All other components within normal limits  URINALYSIS, ROUTINE W REFLEX MICROSCOPIC - Abnormal; Notable for the following components:   Color, Urine STRAW (*)    APPearance CLEAR (*)    Glucose, UA 150 (*)    All other  components within normal limits  LIPID PANEL - Abnormal; Notable for the following components:   Cholesterol 233 (*)    Triglycerides 151 (*)    LDL Cholesterol 159 (*)    All other components within normal limits  SARS CORONAVIRUS 2 BY RT PCR (HOSPITAL ORDER, PERFORMED IN [facility])  PROTIME-INR  APTT  ETHANOL  URINE DRUG SCREEN, QUALITATIVE ([facility] ONLY)  CBC WITH DIFFERENTIAL/PLATELET  HIV ANTIBODY (ROUTINE TESTING W REFLEX)  I-STAT CREATININE, ED  CBG MONITORING, ED   ____________________________________________  EKG  ED ECG REPORT I, Nita Sicklearolina Franziska Podgurski, the attending physician, personally viewed and interpreted this ECG.  Sinus tachycardia, rate of 100, normal intervals, normal axis, anterior Q waves, no ST elevation.  No significant changes when compared to prior. ____________________________________________  RADIOLOGY  I have personally reviewed the images performed during this visit and I agree with the Radiologist's read.   Interpretation by Radiologist:  CT ANGIO HEAD W OR WO CONTRAST  Result Date: 08/25/2019 CLINICAL DATA:  Stroke follow-up EXAM: CT ANGIOGRAPHY HEAD AND NECK TECHNIQUE: Multidetector CT imaging of the head and neck was performed using the standard protocol during bolus administration of intravenous contrast. Multiplanar CT image reconstructions and MIPs were obtained to evaluate the vascular anatomy. Carotid stenosis measurements (when applicable) are obtained utilizing NASCET criteria, using the distal internal carotid diameter as the denominator. CONTRAST:  75mL OMNIPAQUE IOHEXOL 350 MG/ML SOLN COMPARISON:  None. FINDINGS: CTA NECK FINDINGS SKELETON: There is no bony spinal canal stenosis. No lytic or blastic lesion. OTHER NECK: Normal pharynx, larynx and major salivary glands. No cervical lymphadenopathy. Unremarkable thyroid gland. UPPER CHEST: No pneumothorax or pleural effusion. No nodules or masses. AORTIC ARCH: There is mild calcific  atherosclerosis of the aortic arch. There is no aneurysm, dissection or hemodynamically significant stenosis of the visualized portion of the aorta. Conventional 3 vessel aortic branching pattern. The visualized proximal subclavian arteries are widely patent. RIGHT CAROTID SYSTEM: Normal without aneurysm, dissection or stenosis. LEFT CAROTID SYSTEM: Normal without aneurysm, dissection or stenosis. VERTEBRAL ARTERIES: Left dominant configuration. The right vertebral artery is diffusely diminutive. The V1 segment is not visible. The left vertebral artery origin is normal. CTA HEAD FINDINGS POSTERIOR CIRCULATION: --Vertebral arteries: The right vertebral artery terminates  in PICA. Left V4 segment is normal. --Inferior cerebellar arteries: Normal. --Basilar artery: Normal. --Superior cerebellar arteries: Normal. --Posterior cerebral arteries (PCA): There is severe stenosis of the right P1 segment and proximal P2 segment. There is multifocal severe stenosis of the left P2 segment. The distal posterior cerebral arteries are both patent. ANTERIOR CIRCULATION: --Intracranial internal carotid arteries: Atherosclerotic calcification of the internal carotid arteries at the skull base without hemodynamically significant stenosis. --Anterior cerebral arteries (ACA): There is severe stenosis of the proximal right A1 segment. The anterior communicating artery is patent. Otherwise, both ACAs are normal. --Middle cerebral arteries (MCA): Normal. VENOUS SINUSES: As permitted by contrast timing, patent. ANATOMIC VARIANTS: None Review of the MIP images confirms the above findings. IMPRESSION: 1. No emergent large vessel occlusion. 2. 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 Multifocal severe left P2 segment stenosis. 3. Severe stenosis of the proximal right anterior cerebral artery A1 segment. 4. Diffusely diminutive right vertebral artery, which terminates in PICA. 5. Aortic  Atherosclerosis (ICD10-I70.0). Electronically Signed   By: Deatra Robinson M.D.   On: 08/25/2019 04:49   DG Chest 2 View  Result Date: 08/24/2019 CLINICAL DATA:  Chest ache. EXAM: CHEST - 2 VIEW COMPARISON:  March 11, 2019 FINDINGS: Stable cardiomegaly. The hila, mediastinum, lungs, and pleura are normal. IMPRESSION: No active cardiopulmonary disease. Electronically Signed   By: Gerome Sam III M.D   On: 08/24/2019 17:59   CT ANGIO NECK W OR WO CONTRAST  Result Date: 08/25/2019 CLINICAL DATA:  Stroke follow-up EXAM: CT ANGIOGRAPHY HEAD AND NECK TECHNIQUE: Multidetector CT imaging of the head and neck was performed using the standard protocol during bolus administration of intravenous contrast. Multiplanar CT image reconstructions and MIPs were obtained to evaluate the vascular anatomy. Carotid stenosis measurements (when applicable) are obtained utilizing NASCET criteria, using the distal internal carotid diameter as the denominator. CONTRAST:  75mL OMNIPAQUE IOHEXOL 350 MG/ML SOLN COMPARISON:  None. FINDINGS: CTA NECK FINDINGS SKELETON: There is no bony spinal canal stenosis. No lytic or blastic lesion. OTHER NECK: Normal pharynx, larynx and major salivary glands. No cervical lymphadenopathy. Unremarkable thyroid gland. UPPER CHEST: No pneumothorax or pleural effusion. No nodules or masses. AORTIC ARCH: There is mild calcific atherosclerosis of the aortic arch. There is no aneurysm, dissection or hemodynamically significant stenosis of the visualized portion of the aorta. Conventional 3 vessel aortic branching pattern. The visualized proximal subclavian arteries are widely patent. RIGHT CAROTID SYSTEM: Normal without aneurysm, dissection or stenosis. LEFT CAROTID SYSTEM: Normal without aneurysm, dissection or stenosis. VERTEBRAL ARTERIES: Left dominant configuration. The right vertebral artery is diffusely diminutive. The V1 segment is not visible. The left vertebral artery origin is normal. CTA HEAD  FINDINGS POSTERIOR CIRCULATION: --Vertebral arteries: The right vertebral artery terminates in PICA. Left V4 segment is normal. --Inferior cerebellar arteries: Normal. --Basilar artery: Normal. --Superior cerebellar arteries: Normal. --Posterior cerebral arteries (PCA): There is severe stenosis of the right P1 segment and proximal P2 segment. There is multifocal severe stenosis of the left P2 segment. The distal posterior cerebral arteries are both patent. ANTERIOR CIRCULATION: --Intracranial internal carotid arteries: Atherosclerotic calcification of the internal carotid arteries at the skull base without hemodynamically significant stenosis. --Anterior cerebral arteries (ACA): There is severe stenosis of the proximal right A1 segment. The anterior communicating artery is patent. Otherwise, both ACAs are normal. --Middle cerebral arteries (MCA): Normal. VENOUS SINUSES: As permitted by contrast timing, patent. ANATOMIC VARIANTS: None Review of the MIP images confirms the above  findings. IMPRESSION: 1. No emergent large vessel occlusion. 2. 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 Multifocal severe left P2 segment stenosis. 3. Severe stenosis of the proximal right anterior cerebral artery A1 segment. 4. Diffusely diminutive right vertebral artery, which terminates in PICA. 5. Aortic Atherosclerosis (ICD10-I70.0). Electronically Signed   By: Deatra Robinson M.D.   On: 08/25/2019 04:49   MR BRAIN WO CONTRAST  Result Date: 08/25/2019 CLINICAL DATA:  Encephalopathy EXAM: MRI HEAD WITHOUT CONTRAST TECHNIQUE: Multiplanar, multiecho pulse sequences of the brain and surrounding structures were obtained without intravenous contrast. COMPARISON:  08/01/2019 FINDINGS: BRAIN: No acute infarct, acute hemorrhage or extra-axial collection. Unchanged appearance of multiple old infarcts, including both occipital lobes and in the left basal ganglia. Normal volume of CSF spaces.  Single focus of chronic microhemorrhage in the left periatrial region, unchanged. There is multifocal periventricular white matter hyperintensity, most often a result of chronic microvascular ischemia. Normal midline structures. VASCULAR: Major flow voids are preserved. SKULL AND UPPER CERVICAL SPINE: Normal calvarium and skull base. Visualized upper cervical spine and soft tissues are normal. SINUSES/ORBITS: No paranasal sinus fluid levels or advanced mucosal thickening. No mastoid or middle ear effusion. Normal orbits. IMPRESSION: 1. No acute intracranial process. 2. Unchanged appearance of multiple old infarcts and findings of chronic small vessel ischemia. Electronically Signed   By: Deatra Robinson M.D.   On: 08/25/2019 05:41   CT HEAD CODE STROKE WO CONTRAST`  Result Date: 08/25/2019 CLINICAL DATA:  Code stroke.  Encephalopathy EXAM: CT HEAD WITHOUT CONTRAST TECHNIQUE: Contiguous axial images were obtained from the base of the skull through the vertex without intravenous contrast. COMPARISON:  None. FINDINGS: Brain: There is no mass, hemorrhage or extra-axial collection. The size and configuration of the ventricles and extra-axial CSF spaces are normal. There is hypoattenuation of the periventricular white matter, most commonly indicating chronic ischemic microangiopathy. There are old bilateral occipital infarcts and old small vessel infarcts of the left caudate head and corona radiata. Vascular: No abnormal hyperdensity of the major intracranial arteries or dural venous sinuses. No intracranial atherosclerosis. Skull: The visualized skull base, calvarium and extracranial soft tissues are normal. Sinuses/Orbits: No fluid levels or advanced mucosal thickening of the visualized paranasal sinuses. No mastoid or middle ear effusion. The orbits are normal. ASPECTS [facility] Stroke Program Early CT Score) - Ganglionic level infarction (caudate, lentiform nuclei, internal capsule, insula, M1-M3 cortex): 7 -  Supraganglionic infarction (M4-M6 cortex): 3 Total score (0-10 with 10 being normal): 10 IMPRESSION: 1. No acute intracranial abnormality. 2. ASPECTS is 10. 3. 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 These results were called by telephone at the time of interpretation on 08/25/2019 at 3:10 am to provider [facility] , who verbally acknowledged these results. Electronically Signed   By: Deatra Robinson M.D.   On: 08/25/2019 03:10     ____________________________________________   PROCEDURES  Procedure(s) performed: yes .1-3 Lead EKG Interpretation Performed by: Nita Sickle, MD Authorized by: Nita Sickle, MD     Interpretation: normal     ECG rate assessment: normal     Rhythm: sinus rhythm     Ectopy: none     Critical Care performed:  None ____________________________________________   INITIAL IMPRESSION / ASSESSMENT AND PLAN / ED COURSE  64 y.o. female the history of CVA with right-sided weakness, asthma, anxiety, diabetes, hypertension, hyperlipidemia, chronic kidney disease who presents for evaluation of altered mental status.  Last seen normal at 1 AM.  Patient opens her eyes spontaneously, keeps her head turned to the left, not moving any of her extremities although able to flicker her fingers bilaterally, unable to move lower extremities.  She is aphasic and not answering any questions.  Daughter is very upset and crying and reports that this is not typical of her mother.  Patient was seen here earlier today for anxiety and given Xanax which is not a new medication for her.  Patient was made a code stroke.  She was evaluated by neurology who thinks this is encephalopathy and recommended against TPA especially since patient had a recent stroke a couple months ago.  She also recommended CT angio and MRI which is now pending.  Blood glucose normal.  Labs with no acute abnormalities.  EKG unchanged from baseline.  Patient's mental status is  clearing and she is able to now answer a few questions.  Still not moving her extremities however once swabbed for Covid patient immediately started pushing and shoving the nurse with both arms and had full strength. History gathered from daughter.  Old medical records reviewed.  Will be admitted to the hospitalist service for further evaluation.  Patient did not pass swallow eval therefore was given rectal aspirin.      _____________________________________________ Please note:  Patient was evaluated in Emergency Department today for the symptoms described in the history of present illness. Patient was evaluated in the context of the global COVID-19 pandemic, which necessitated consideration that the patient might be at risk for infection with the SARS-CoV-2 virus that causes COVID-19. Institutional protocols and algorithms that pertain to the evaluation of patients at risk for COVID-19 are in a state of rapid change based on information released by regulatory bodies including the CDC and federal and state organizations. These policies and algorithms were followed during the patient's care in the ED.  Some ED evaluations and interventions may be delayed as a result of limited staffing during the pandemic.   [place] Controlled Substance Database was reviewed by me. ____________________________________________   FINAL CLINICAL IMPRESSION(S) / ED DIAGNOSES   Final diagnoses:  Encephalopathy      NEW MEDICATIONS STARTED DURING THIS VISIT:  ED Discharge Orders    None       Note:  This document was prepared using Dragon voice recognition software and may include unintentional dictation errors.    Don Perking, [place], MD 08/25/19 [phone]

## 2019-08-25 NOTE — ED Notes (Signed)
Pt given meal- took a few bites but reports she doesn't have appetite at this time.

## 2019-08-25 NOTE — ED Notes (Signed)
Pt back to room. Covered with fresh warm blankets. Neuro cart to bedside.

## 2019-08-25 NOTE — ED Notes (Signed)
Fleet Contras RN at bedside. Neurologist on with pt/family now.

## 2019-08-25 NOTE — Progress Notes (Signed)
PT Cancellation Note  Patient Details Name: Tami Jones MRN: [id number] DOB: November 09, 1955   Cancelled Treatment:    Reason Eval/Treat Not Completed:  (Consult received and chart reviewed.  Patient refusing participation with evaluation at this time.  Will re-attempt next date as medically appropriate.)   Amandamarie Feggins H. Manson Passey, PT, DPT, NCS 08/25/19, 4:11 PM [phone]

## 2019-08-25 NOTE — ED Notes (Signed)
BG 203

## 2019-08-25 NOTE — Progress Notes (Signed)
Patient ID: Tami Jones, female   DOB: Nov 29, 1955, 64 y.o.   MRN: [id number] [facility] PROGRESS NOTE  Anesa Fronek Gandolfi NWG:[id number] DOB: 11-Apr-1955 DOA: 08/25/2019 PCP: Karie Schwalbe, MD  HPI/Subjective: Patient had an episode yesterday where she could not speak and could not move.  Ended up coming to the hospital for a code stroke and MRI of the brain was negative.  When I saw her today she was able to speak but was having headache.  She felt funny on the top of her head and her left arm was hurting.  Repeat EKG did not show any ST elevation.  Patient also having patient also having lot of nausea today.  Chronically has some weakness on the right side.  Objective: Vitals:   08/25/19 1511 08/25/19 1513  BP: 113/68   Pulse:  74  Resp: 14 13  Temp:    SpO2:  97%   No intake or output data in the 24 hours ending 08/25/19 1544 There were no vitals filed for this visit.  ROS: Review of Systems  Respiratory: Negative for shortness of breath.   Cardiovascular: Negative for chest pain.  Gastrointestinal: Positive for nausea. Negative for abdominal pain and vomiting.   Exam: Physical Exam HENT:     Nose: No mucosal edema.     Mouth/Throat:     Pharynx: No oropharyngeal exudate.  Eyes:     General: Lids are normal.     Conjunctiva/sclera: Conjunctivae normal.     Pupils: Pupils are equal, round, and reactive to light.  Cardiovascular:     Rate and Rhythm: Normal rate and regular rhythm.     Heart sounds: S1 normal and S2 normal. No murmur heard.   Pulmonary:     Effort: No respiratory distress.     Breath sounds: No wheezing, rhonchi or rales.  Abdominal:     Palpations: Abdomen is soft.     Tenderness: There is no abdominal tenderness.  Musculoskeletal:     Right ankle: Swelling present.     Left ankle: Swelling present.  Skin:    General: Skin is warm.     Findings: No rash.     Nails: There is no clubbing.  Neurological:     Mental Status: She is alert and  oriented to person, place, and time.     Comments: Barely able to straight leg raise either leg.  Unable to push down or lift up with her toes on the right side.       Data Reviewed: Basic Metabolic Panel: Recent Labs  Lab 08/24/19 1726 08/25/19 0238  NA 137 135  K 3.7 4.0  CL 101 97*  CO2 21* 25  GLUCOSE 136* 235*  BUN 22 23  CREATININE 1.16* 1.33*  CALCIUM 9.0 9.5   Liver Function Tests: Recent Labs  Lab 08/25/19 0238  AST 24  ALT 29  ALKPHOS 88  BILITOT 0.7  PROT 8.0  ALBUMIN 4.7   CBC: Recent Labs  Lab 08/24/19 1726 08/25/19 0238  WBC 7.2 7.2  NEUTROABS  --  3.9  HGB 14.0 14.7  HCT 41.4 43.1  MCV 90.8 89.6  PLT 255 245    CBG: Recent Labs  Lab 08/25/19 0244 08/25/19 1005 08/25/19 1341  GLUCAP 203* 167* 115*    Recent Results (from the past 240 hour(s))  SARS Coronavirus 2 by RT PCR (hospital order, performed in [facility] hospital lab) Nasopharyngeal     Status: None   Collection Time: 08/25/19  4:05 AM   Specimen: Nasopharyngeal  Result Value Ref Range Status   SARS Coronavirus 2 NEGATIVE NEGATIVE Final    Comment: (NOTE) SARS-CoV-2 target nucleic acids are NOT DETECTED.  The SARS-CoV-2 RNA is generally detectable in upper and lower respiratory specimens during the acute phase of infection. The lowest concentration of SARS-CoV-2 viral copies this assay can detect is 250 copies / mL. A negative result does not preclude SARS-CoV-2 infection and should not be used as the sole basis for treatment or other patient management decisions.  A negative result may occur with improper specimen collection / handling, submission of specimen other than nasopharyngeal swab, presence of viral mutation(s) within the areas targeted by this assay, and inadequate number of viral copies (<250 copies / mL). A negative result must be combined with clinical observations, patient history, and epidemiological information.  Fact Sheet for Patients:    BoilerBrush.com.cy  Fact Sheet for Healthcare Providers: https://pope.com/  This test is not yet approved or  cleared by the [place] FDA and has been authorized for detection and/or diagnosis of SARS-CoV-2 by FDA under an Emergency Use Authorization (EUA).  This EUA will remain in effect (meaning this test can be used) for the duration of the COVID-19 declaration under Section 564(b)(1) of the Act, 21 U.S.C. section 360bbb-3(b)(1), unless the authorization is terminated or revoked sooner.  Performed at [facility], [address] [place], [place] [address]      Studies: CT ANGIO HEAD W OR WO CONTRAST  Result Date: 08/25/2019 CLINICAL DATA:  Stroke follow-up EXAM: CT ANGIOGRAPHY HEAD AND NECK TECHNIQUE: Multidetector CT imaging of the head and neck was performed using the standard protocol during bolus administration of intravenous contrast. Multiplanar CT image reconstructions and MIPs were obtained to evaluate the vascular anatomy. Carotid stenosis measurements (when applicable) are obtained utilizing NASCET criteria, using the distal internal carotid diameter as the denominator. CONTRAST:  75mL OMNIPAQUE IOHEXOL 350 MG/ML SOLN COMPARISON:  None. FINDINGS: CTA NECK FINDINGS SKELETON: There is no bony spinal canal stenosis. No lytic or blastic lesion. OTHER NECK: Normal pharynx, larynx and major salivary glands. No cervical lymphadenopathy. Unremarkable thyroid gland. UPPER CHEST: No pneumothorax or pleural effusion. No nodules or masses. AORTIC ARCH: There is mild calcific atherosclerosis of the aortic arch. There is no aneurysm, dissection or hemodynamically significant stenosis of the visualized portion of the aorta. Conventional 3 vessel aortic branching pattern. The visualized proximal subclavian arteries are widely patent. RIGHT CAROTID SYSTEM: Normal without aneurysm, dissection or stenosis. LEFT CAROTID SYSTEM: Normal  without aneurysm, dissection or stenosis. VERTEBRAL ARTERIES: Left dominant configuration. The right vertebral artery is diffusely diminutive. The V1 segment is not visible. The left vertebral artery origin is normal. CTA HEAD FINDINGS POSTERIOR CIRCULATION: --Vertebral arteries: The right vertebral artery terminates in PICA. Left V4 segment is normal. --Inferior cerebellar arteries: Normal. --Basilar artery: Normal. --Superior cerebellar arteries: Normal. --Posterior cerebral arteries (PCA): There is severe stenosis of the right P1 segment and proximal P2 segment. There is multifocal severe stenosis of the left P2 segment. The distal posterior cerebral arteries are both patent. ANTERIOR CIRCULATION: --Intracranial internal carotid arteries: Atherosclerotic calcification of the internal carotid arteries at the skull base without hemodynamically significant stenosis. --Anterior cerebral arteries (ACA): There is severe stenosis of the proximal right A1 segment. The anterior communicating artery is patent. Otherwise, both ACAs are normal. --Middle cerebral arteries (MCA): Normal. VENOUS SINUSES: As permitted by contrast timing, patent. ANATOMIC VARIANTS: None Review of the MIP images confirms the above  findings. IMPRESSION: 1. No emergent large vessel occlusion. 2. 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 Multifocal severe left P2 segment stenosis. 3. Severe stenosis of the proximal right anterior cerebral artery A1 segment. 4. Diffusely diminutive right vertebral artery, which terminates in PICA. 5. Aortic Atherosclerosis (ICD10-I70.0). Electronically Signed   By: Deatra Robinson M.D.   On: 08/25/2019 04:49   DG Chest 2 View  Result Date: 08/24/2019 CLINICAL DATA:  Chest ache. EXAM: CHEST - 2 VIEW COMPARISON:  March 11, 2019 FINDINGS: Stable cardiomegaly. The hila, mediastinum, lungs, and pleura are normal. IMPRESSION: No active cardiopulmonary disease.  Electronically Signed   By: Gerome Sam III M.D   On: 08/24/2019 17:59   CT ANGIO NECK W OR WO CONTRAST  Result Date: 08/25/2019 CLINICAL DATA:  Stroke follow-up EXAM: CT ANGIOGRAPHY HEAD AND NECK TECHNIQUE: Multidetector CT imaging of the head and neck was performed using the standard protocol during bolus administration of intravenous contrast. Multiplanar CT image reconstructions and MIPs were obtained to evaluate the vascular anatomy. Carotid stenosis measurements (when applicable) are obtained utilizing NASCET criteria, using the distal internal carotid diameter as the denominator. CONTRAST:  26mL OMNIPAQUE IOHEXOL 350 MG/ML SOLN COMPARISON:  None. FINDINGS: CTA NECK FINDINGS SKELETON: There is no bony spinal canal stenosis. No lytic or blastic lesion. OTHER NECK: Normal pharynx, larynx and major salivary glands. No cervical lymphadenopathy. Unremarkable thyroid gland. UPPER CHEST: No pneumothorax or pleural effusion. No nodules or masses. AORTIC ARCH: There is mild calcific atherosclerosis of the aortic arch. There is no aneurysm, dissection or hemodynamically significant stenosis of the visualized portion of the aorta. Conventional 3 vessel aortic branching pattern. The visualized proximal subclavian arteries are widely patent. RIGHT CAROTID SYSTEM: Normal without aneurysm, dissection or stenosis. LEFT CAROTID SYSTEM: Normal without aneurysm, dissection or stenosis. VERTEBRAL ARTERIES: Left dominant configuration. The right vertebral artery is diffusely diminutive. The V1 segment is not visible. The left vertebral artery origin is normal. CTA HEAD FINDINGS POSTERIOR CIRCULATION: --Vertebral arteries: The right vertebral artery terminates in PICA. Left V4 segment is normal. --Inferior cerebellar arteries: Normal. --Basilar artery: Normal. --Superior cerebellar arteries: Normal. --Posterior cerebral arteries (PCA): There is severe stenosis of the right P1 segment and proximal P2 segment. There is  multifocal severe stenosis of the left P2 segment. The distal posterior cerebral arteries are both patent. ANTERIOR CIRCULATION: --Intracranial internal carotid arteries: Atherosclerotic calcification of the internal carotid arteries at the skull base without hemodynamically significant stenosis. --Anterior cerebral arteries (ACA): There is severe stenosis of the proximal right A1 segment. The anterior communicating artery is patent. Otherwise, both ACAs are normal. --Middle cerebral arteries (MCA): Normal. VENOUS SINUSES: As permitted by contrast timing, patent. ANATOMIC VARIANTS: None Review of the MIP images confirms the above findings. IMPRESSION: 1. No emergent large vessel occlusion. 2. 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 Multifocal severe left P2 segment stenosis. 3. Severe stenosis of the proximal right anterior cerebral artery A1 segment. 4. Diffusely diminutive right vertebral artery, which terminates in PICA. 5. Aortic Atherosclerosis (ICD10-I70.0). Electronically Signed   By: Deatra Robinson M.D.   On: 08/25/2019 04:49   MR BRAIN WO CONTRAST  Result Date: 08/25/2019 CLINICAL DATA:  Encephalopathy EXAM: MRI HEAD WITHOUT CONTRAST TECHNIQUE: Multiplanar, multiecho pulse sequences of the brain and surrounding structures were obtained without intravenous contrast. COMPARISON:  08/01/2019 FINDINGS: BRAIN: No acute infarct, acute hemorrhage or extra-axial collection. Unchanged appearance of multiple old infarcts,  including both occipital lobes and in the left basal ganglia. Normal volume of CSF spaces. Single focus of chronic microhemorrhage in the left periatrial region, unchanged. There is multifocal periventricular white matter hyperintensity, most often a result of chronic microvascular ischemia. Normal midline structures. VASCULAR: Major flow voids are preserved. SKULL AND UPPER CERVICAL SPINE: Normal calvarium and skull base. Visualized upper  cervical spine and soft tissues are normal. SINUSES/ORBITS: No paranasal sinus fluid levels or advanced mucosal thickening. No mastoid or middle ear effusion. Normal orbits. IMPRESSION: 1. No acute intracranial process. 2. Unchanged appearance of multiple old infarcts and findings of chronic small vessel ischemia. Electronically Signed   By: Deatra RobinsonKevin  Herman M.D.   On: 08/25/2019 05:41   CT HEAD CODE STROKE WO CONTRAST`  Result Date: 08/25/2019 CLINICAL DATA:  Code stroke.  Encephalopathy EXAM: CT HEAD WITHOUT CONTRAST TECHNIQUE: Contiguous axial images were obtained from the base of the skull through the vertex without intravenous contrast. COMPARISON:  None. FINDINGS: Brain: There is no mass, hemorrhage or extra-axial collection. The size and configuration of the ventricles and extra-axial CSF spaces are normal. There is hypoattenuation of the periventricular white matter, most commonly indicating chronic ischemic microangiopathy. There are old bilateral occipital infarcts and old small vessel infarcts of the left caudate head and corona radiata. Vascular: No abnormal hyperdensity of the major intracranial arteries or dural venous sinuses. No intracranial atherosclerosis. Skull: The visualized skull base, calvarium and extracranial soft tissues are normal. Sinuses/Orbits: No fluid levels or advanced mucosal thickening of the visualized paranasal sinuses. No mastoid or middle ear effusion. The orbits are normal. ASPECTS [facility]([place] Stroke Program Early CT Score) - Ganglionic level infarction (caudate, lentiform nuclei, internal capsule, insula, M1-M3 cortex): 7 - Supraganglionic infarction (M4-M6 cortex): 3 Total score (0-10 with 10 being normal): 10 IMPRESSION: 1. No acute intracranial abnormality. 2. ASPECTS is 10. 3. 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 These results were called by telephone at the time of interpretation on 08/25/2019 at 3:10 am to provider [facility] Associates[facility]OLINA VERONESE , who  verbally acknowledged these results. Electronically Signed   By: Deatra RobinsonKevin  Herman M.D.   On: 08/25/2019 03:10    Scheduled Meds:   stroke: mapping our early stages of recovery book   Does not apply Once   aspirin EC  81 mg Oral QPM   atorvastatin  80 mg Oral QPM   clopidogrel  75 mg Oral Daily   dexamethasone (DECADRON) injection  10 mg Intravenous Once   enoxaparin (LOVENOX) injection  40 mg Subcutaneous Q12H   insulin aspart  0-15 Units Subcutaneous TID WC   insulin aspart  0-5 Units Subcutaneous QHS   promethazine  12.5 mg Intravenous Once   Continuous Infusions:  sodium chloride      Assessment/Plan:  1. Acute metabolic encephalopathy with the inability to speak and total body weakness.  MRI of the brain negative for acute stroke but did see old stroke.  Patient does have some stenosis seen on CT angiogram.  Neurology evaluation.  Physical therapy evaluation.  Patient initially failed the swallow evaluation but now more alert and placed on a diet.  We will get neurology evaluation on whether this is a TIA or not.  Currently on aspirin Plavix and atorvastatin. 2. Acute headache and left arm pain.  Initially started off with magnesium IV and Toradol IV.  Then I moved to Depakote IV and then Phenergan and Decadron IV. 3. Nausea.  Ordered Zofran and then 1 dose of Phenergan. 4. Essential hypertension.  Allow permissive hypertension at this point holding antihypertensive medications 5. Type 2 diabetes mellitus with hyperlipidemia.  Continue statin and holding oral medications and using sliding scale for now. 6. Anxiety on Xanax    Code Status:     Code Status Orders  (From admission, onward)         Start     Ordered   08/25/19 0423  Full code  Continuous        08/25/19 0426        Code Status History    Date Active Date Inactive Code Status Order ID Comments User Context   08/01/2019 0809 08/02/2019 1904 Full Code [id number]  Lorretta Harp, MD ED   06/09/2019 1730  06/10/2019 2131 Full Code [id number]  Lorretta Harp, MD Inpatient   08/04/2018 0216 08/05/2018 1532 Full Code [id number]  Pearletha Alfred, NP Inpatient   07/10/2014 0923 07/11/2014 1845 Full Code [id number]  Arnaldo Natal, MD ED   Advance Care Planning Activity     Family Communication: Family at the bedside Disposition Plan: Status is: Observation  Dispo: The patient is from: Home              Anticipated d/c is to: Home with home health              Anticipated d/c date is: Potential 08/26/2019 depending on how she is doing.              Patient currently being treated for nausea and headache.  Potential TIA.  Consultants:  Neurology  Time spent: 35 minutes  [employer]

## 2019-08-25 NOTE — Progress Notes (Signed)
PHARMACIST - PHYSICIAN COMMUNICATION  CONCERNING:  Enoxaparin (Lovenox) for DVT Prophylaxis    RECOMMENDATION: Patient was prescribed enoxaprin 40mg  q24 hours for VTE prophylaxis.   There were no vitals filed for this visit.  There is no height or weight on file to calculate BMI.  Estimated Creatinine Clearance: 52.6 mL/min (A) (by C-G formula based on SCr of 1.33 mg/dL (H)).   Based on [facility] policy patient is candidate for enoxaparin 40mg  every 12 hour dosing due to BMI being >40.   DESCRIPTION: Pharmacy has adjusted enoxaparin dose per [facility] policy.  Patient is now receiving enoxaparin 40mg  every 12 hours.    , PharmD Clinical Pharmacist  08/25/2019 4:36 AM

## 2019-08-26 DIAGNOSIS — E1122 Type 2 diabetes mellitus with diabetic chronic kidney disease: Secondary | ICD-10-CM | POA: Diagnosis present

## 2019-08-26 DIAGNOSIS — N1831 Chronic kidney disease, stage 3a: Secondary | ICD-10-CM | POA: Diagnosis present

## 2019-08-26 DIAGNOSIS — I639 Cerebral infarction, unspecified: Secondary | ICD-10-CM | POA: Diagnosis not present

## 2019-08-26 DIAGNOSIS — F329 Major depressive disorder, single episode, unspecified: Secondary | ICD-10-CM | POA: Diagnosis present

## 2019-08-26 DIAGNOSIS — K219 Gastro-esophageal reflux disease without esophagitis: Secondary | ICD-10-CM | POA: Diagnosis present

## 2019-08-26 DIAGNOSIS — R110 Nausea: Secondary | ICD-10-CM | POA: Diagnosis not present

## 2019-08-26 DIAGNOSIS — I1310 Hypertensive heart and chronic kidney disease without heart failure, with stage 1 through stage 4 chronic kidney disease, or unspecified chronic kidney disease: Secondary | ICD-10-CM | POA: Diagnosis present

## 2019-08-26 DIAGNOSIS — E785 Hyperlipidemia, unspecified: Secondary | ICD-10-CM | POA: Diagnosis present

## 2019-08-26 DIAGNOSIS — Z8249 Family history of ischemic heart disease and other diseases of the circulatory system: Secondary | ICD-10-CM | POA: Diagnosis not present

## 2019-08-26 DIAGNOSIS — Z833 Family history of diabetes mellitus: Secondary | ICD-10-CM | POA: Diagnosis not present

## 2019-08-26 DIAGNOSIS — R112 Nausea with vomiting, unspecified: Secondary | ICD-10-CM

## 2019-08-26 DIAGNOSIS — G9341 Metabolic encephalopathy: Secondary | ICD-10-CM | POA: Diagnosis present

## 2019-08-26 DIAGNOSIS — I10 Essential (primary) hypertension: Secondary | ICD-10-CM | POA: Diagnosis not present

## 2019-08-26 DIAGNOSIS — R4701 Aphasia: Secondary | ICD-10-CM | POA: Diagnosis present

## 2019-08-26 DIAGNOSIS — F419 Anxiety disorder, unspecified: Secondary | ICD-10-CM | POA: Diagnosis present

## 2019-08-26 DIAGNOSIS — R519 Headache, unspecified: Secondary | ICD-10-CM | POA: Diagnosis not present

## 2019-08-26 DIAGNOSIS — Z79899 Other long term (current) drug therapy: Secondary | ICD-10-CM | POA: Diagnosis not present

## 2019-08-26 DIAGNOSIS — E1169 Type 2 diabetes mellitus with other specified complication: Secondary | ICD-10-CM | POA: Diagnosis present

## 2019-08-26 DIAGNOSIS — I159 Secondary hypertension, unspecified: Secondary | ICD-10-CM | POA: Diagnosis present

## 2019-08-26 DIAGNOSIS — R531 Weakness: Secondary | ICD-10-CM

## 2019-08-26 DIAGNOSIS — Z7982 Long term (current) use of aspirin: Secondary | ICD-10-CM | POA: Diagnosis not present

## 2019-08-26 DIAGNOSIS — R29718 NIHSS score 18: Secondary | ICD-10-CM | POA: Diagnosis present

## 2019-08-26 DIAGNOSIS — E7800 Pure hypercholesterolemia, unspecified: Secondary | ICD-10-CM | POA: Diagnosis present

## 2019-08-26 DIAGNOSIS — M79602 Pain in left arm: Secondary | ICD-10-CM | POA: Diagnosis present

## 2019-08-26 DIAGNOSIS — I69351 Hemiplegia and hemiparesis following cerebral infarction affecting right dominant side: Secondary | ICD-10-CM | POA: Diagnosis not present

## 2019-08-26 DIAGNOSIS — R471 Dysarthria and anarthria: Secondary | ICD-10-CM | POA: Diagnosis present

## 2019-08-26 DIAGNOSIS — G9340 Encephalopathy, unspecified: Secondary | ICD-10-CM | POA: Diagnosis present

## 2019-08-26 DIAGNOSIS — Z20822 Contact with and (suspected) exposure to covid-19: Secondary | ICD-10-CM | POA: Diagnosis present

## 2019-08-26 DIAGNOSIS — J45909 Unspecified asthma, uncomplicated: Secondary | ICD-10-CM | POA: Diagnosis present

## 2019-08-26 DIAGNOSIS — R0789 Other chest pain: Secondary | ICD-10-CM | POA: Diagnosis present

## 2019-08-26 DIAGNOSIS — N179 Acute kidney failure, unspecified: Secondary | ICD-10-CM | POA: Diagnosis present

## 2019-08-26 LAB — BASIC METABOLIC PANEL
Anion gap: 12 (ref 5–15)
BUN: 33 mg/dL — ABNORMAL HIGH (ref 8–23)
CO2: 22 mmol/L (ref 22–32)
Calcium: 9 mg/dL (ref 8.9–10.3)
Chloride: 99 mmol/L (ref 98–111)
Creatinine, Ser: 1.65 mg/dL — ABNORMAL HIGH (ref 0.44–1.00)
GFR calc Af Amer: 38 mL/min — ABNORMAL LOW (ref >60)
GFR calc non Af Amer: 33 mL/min — ABNORMAL LOW (ref >60)
Glucose, Bld: 379 mg/dL — ABNORMAL HIGH (ref 70–99)
Potassium: 4.7 mmol/L (ref 3.5–5.1)
Sodium: 133 mmol/L — ABNORMAL LOW (ref 135–145)

## 2019-08-26 LAB — TROPONIN I (HIGH SENSITIVITY)
Troponin I (High Sensitivity): 7 ng/L (ref <18)
Troponin I (High Sensitivity): 7 ng/L (ref <18)

## 2019-08-26 LAB — GLUCOSE, CAPILLARY
Glucose-Capillary: 388 mg/dL — ABNORMAL HIGH (ref 70–99)
Glucose-Capillary: 363 mg/dL — ABNORMAL HIGH (ref 70–99)
Glucose-Capillary: 258 mg/dL — ABNORMAL HIGH (ref 70–99)
Glucose-Capillary: 184 mg/dL — ABNORMAL HIGH (ref 70–99)

## 2019-08-26 MED ORDER — INSULIN GLARGINE 100 UNIT/ML ~~LOC~~ SOLN
8.0000 [IU] | Freq: Every day | SUBCUTANEOUS | Status: DC
  Administered 2019-08-26 – 2019-08-28 (×3): 8 [IU] via SUBCUTANEOUS
  Filled 2019-08-26 (×3): qty 0.08

## 2019-08-26 MED ORDER — PANTOPRAZOLE SODIUM 40 MG IV SOLR
40.0000 mg | Freq: Two times a day (BID) | INTRAVENOUS | Status: DC
  Administered 2019-08-26 – 2019-08-28 (×5): 40 mg via INTRAVENOUS
  Filled 2019-08-26 (×5): qty 40

## 2019-08-26 MED ORDER — OXYCODONE-ACETAMINOPHEN 5-325 MG PO TABS
1.0000 | ORAL_TABLET | Freq: Once | ORAL | Status: DC
  Filled 2019-08-26: qty 1

## 2019-08-26 MED ORDER — AMLODIPINE BESYLATE 10 MG PO TABS
10.0000 mg | ORAL_TABLET | Freq: Every day | ORAL | Status: DC
  Administered 2019-08-26: 10 mg via ORAL
  Filled 2019-08-26: qty 1

## 2019-08-26 MED ORDER — CLOPIDOGREL BISULFATE 75 MG PO TABS
300.0000 mg | ORAL_TABLET | Freq: Once | ORAL | Status: AC
  Administered 2019-08-26: 300 mg via ORAL
  Filled 2019-08-26: qty 4

## 2019-08-26 MED ORDER — ALPRAZOLAM 0.25 MG PO TABS
0.2500 mg | ORAL_TABLET | Freq: Three times a day (TID) | ORAL | Status: DC | PRN
  Administered 2019-08-26 – 2019-08-27 (×3): 0.25 mg via ORAL
  Filled 2019-08-26 (×3): qty 1

## 2019-08-26 MED ORDER — METOPROLOL SUCCINATE ER 25 MG PO TB24
25.0000 mg | ORAL_TABLET | Freq: Every day | ORAL | Status: DC
  Administered 2019-08-26 – 2019-08-28 (×3): 25 mg via ORAL
  Filled 2019-08-26 (×3): qty 1

## 2019-08-26 NOTE — Progress Notes (Addendum)
Subjective: Patient with headache this morning along with nausea and vomiting  Objective: Current vital signs: BP (!) 177/89 (BP Location: Right Arm)   Pulse (!) 112   Temp 97.6 F (36.4 C) (Oral)   Resp 20   Ht 5\' 4"  (1.626 m)   Wt 108 kg   SpO2 100%   BMI 40.85 kg/m  Vital signs in last 24 hours: Temp:  [97.6 F (36.4 C)-98.1 F (36.7 C)] 97.6 F (36.4 C) (07/11 0801) Pulse Rate:  [67-112] 112 (07/11 1029) Resp:  [12-20] 20 (07/11 1029) BP: (100-186)/(57-89) 177/89 (07/11 1029) SpO2:  [94 %-100 %] 100 % (07/11 0801) Weight:  [108 kg-108.3 kg] 108 kg (07/11 0402)  Intake/Output from previous day: 07/10 0701 - 07/11 0700 In: -  Out: 150 [Urine:150] Intake/Output this shift: No intake/output data recorded. Nutritional status:  Diet Order            Diet heart healthy/carb modified Room service appropriate? Yes; Fluid consistency: Thin  Diet effective now                 Neurologic Exam: Mental Status: Alert and awake.  Less than cooperative due to feeling poorly. Cranial Nerves: II: Visual fields grossly normal, pupils equal, round, reactive to light and accommodation III,IV, VI: ptosis not present, extra-ocular motions intact bilaterally V,VII: smile symmetric, facial light touch sensation normal bilaterally VIII: hearing normal bilaterally IX,X: gag reflex present XI: bilateral shoulder shrug XII: midline tongue extension Motor: Right :  Upper extremity   3+/5                                                Left:     Upper extremity   4/5             Lower extremity   3-/5                                                 Lower extremity   3/5 Tone and bulk:normal tone throughout; no atrophy noted   Lab Results: Basic Metabolic Panel: Recent Labs  Lab 08/24/19 1726 08/25/19 0238 08/26/19 0456  NA 137 135 133*  K 3.7 4.0 4.7  CL 101 97* 99  CO2 21* 25 22  GLUCOSE 136* 235* 379*  BUN 22 23 33*  CREATININE 1.16* 1.33* 1.65*  CALCIUM 9.0 9.5 9.0     Liver Function Tests: Recent Labs  Lab 08/25/19 0238  AST 24  ALT 29  ALKPHOS 88  BILITOT 0.7  PROT 8.0  ALBUMIN 4.7   No results for input(s): LIPASE, AMYLASE in the last 168 hours. No results for input(s): AMMONIA in the last 168 hours.  CBC: Recent Labs  Lab 08/24/19 1726 08/25/19 0238  WBC 7.2 7.2  NEUTROABS  --  3.9  HGB 14.0 14.7  HCT 41.4 43.1  MCV 90.8 89.6  PLT 255 245    Cardiac Enzymes: No results for input(s): CKTOTAL, CKMB, CKMBINDEX, TROPONINI in the last 168 hours.  Lipid Panel: Recent Labs  Lab 08/25/19 0238  CHOL 233*  TRIG 151*  HDL 44  CHOLHDL 5.3  VLDL 30  LDLCALC 10/26/19*    CBG: Recent Labs  Lab 08/25/19 1005 08/25/19  1341 08/25/19 1640 08/25/19 2050 08/26/19 0955  GLUCAP 167* 115* 135* 257* 388*    Microbiology: Results for orders placed or performed during the hospital encounter of 08/25/19  SARS Coronavirus 2 by RT PCR (hospital order, performed in [facility] hospital lab) Nasopharyngeal     Status: None   Collection Time: 08/25/19  4:05 AM   Specimen: Nasopharyngeal  Result Value Ref Range Status   SARS Coronavirus 2 NEGATIVE NEGATIVE Final    Comment: (NOTE) SARS-CoV-2 target nucleic acids are NOT DETECTED.  The SARS-CoV-2 RNA is generally detectable in upper and lower respiratory specimens during the acute phase of infection. The lowest concentration of SARS-CoV-2 viral copies this assay can detect is 250 copies / mL. A negative result does not preclude SARS-CoV-2 infection and should not be used as the sole basis for treatment or other patient management decisions.  A negative result may occur with improper specimen collection / handling, submission of specimen other than nasopharyngeal swab, presence of viral mutation(s) within the areas targeted by this assay, and inadequate number of viral copies (<250 copies / mL). A negative result must be combined with clinical observations, patient history, and  epidemiological information.  Fact Sheet for Patients:   BoilerBrush.com.cy  Fact Sheet for Healthcare Providers: https://pope.com/  This test is not yet approved or  cleared by the [place] FDA and has been authorized for detection and/or diagnosis of SARS-CoV-2 by FDA under an Emergency Use Authorization (EUA).  This EUA will remain in effect (meaning this test can be used) for the duration of the COVID-19 declaration under Section 564(b)(1) of the Act, 21 U.S.C. section 360bbb-3(b)(1), unless the authorization is terminated or revoked sooner.  Performed at [facility], [address] [place], [place] [address]     Coagulation Studies: Recent Labs    08/25/19 0238  LABPROT 11.8  INR 0.9    Imaging: CT ANGIO HEAD W OR WO CONTRAST  Result Date: 08/25/2019 CLINICAL DATA:  Stroke follow-up EXAM: CT ANGIOGRAPHY HEAD AND NECK TECHNIQUE: Multidetector CT imaging of the head and neck was performed using the standard protocol during bolus administration of intravenous contrast. Multiplanar CT image reconstructions and MIPs were obtained to evaluate the vascular anatomy. Carotid stenosis measurements (when applicable) are obtained utilizing NASCET criteria, using the distal internal carotid diameter as the denominator. CONTRAST:  75mL OMNIPAQUE IOHEXOL 350 MG/ML SOLN COMPARISON:  None. FINDINGS: CTA NECK FINDINGS SKELETON: There is no bony spinal canal stenosis. No lytic or blastic lesion. OTHER NECK: Normal pharynx, larynx and major salivary glands. No cervical lymphadenopathy. Unremarkable thyroid gland. UPPER CHEST: No pneumothorax or pleural effusion. No nodules or masses. AORTIC ARCH: There is mild calcific atherosclerosis of the aortic arch. There is no aneurysm, dissection or hemodynamically significant stenosis of the visualized portion of the aorta. Conventional 3 vessel aortic branching pattern. The visualized proximal  subclavian arteries are widely patent. RIGHT CAROTID SYSTEM: Normal without aneurysm, dissection or stenosis. LEFT CAROTID SYSTEM: Normal without aneurysm, dissection or stenosis. VERTEBRAL ARTERIES: Left dominant configuration. The right vertebral artery is diffusely diminutive. The V1 segment is not visible. The left vertebral artery origin is normal. CTA HEAD FINDINGS POSTERIOR CIRCULATION: --Vertebral arteries: The right vertebral artery terminates in PICA. Left V4 segment is normal. --Inferior cerebellar arteries: Normal. --Basilar artery: Normal. --Superior cerebellar arteries: Normal. --Posterior cerebral arteries (PCA): There is severe stenosis of the right P1 segment and proximal P2 segment. There is multifocal severe stenosis of the left P2 segment. The distal posterior  cerebral arteries are both patent. ANTERIOR CIRCULATION: --Intracranial internal carotid arteries: Atherosclerotic calcification of the internal carotid arteries at the skull base without hemodynamically significant stenosis. --Anterior cerebral arteries (ACA): There is severe stenosis of the proximal right A1 segment. The anterior communicating artery is patent. Otherwise, both ACAs are normal. --Middle cerebral arteries (MCA): Normal. VENOUS SINUSES: As permitted by contrast timing, patent. ANATOMIC VARIANTS: None Review of the MIP images confirms the above findings. IMPRESSION: 1. No emergent large vessel occlusion. 2. 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 Multifocal severe left P2 segment stenosis. 3. Severe stenosis of the proximal right anterior cerebral artery A1 segment. 4. Diffusely diminutive right vertebral artery, which terminates in PICA. 5. Aortic Atherosclerosis (ICD10-I70.0). Electronically Signed   By: Deatra RobinsonKevin  Herman M.D.   On: 08/25/2019 04:49   DG Chest 2 View  Result Date: 08/24/2019 CLINICAL DATA:  Chest ache. EXAM: CHEST - 2 VIEW COMPARISON:  March 11, 2019  FINDINGS: Stable cardiomegaly. The hila, mediastinum, lungs, and pleura are normal. IMPRESSION: No active cardiopulmonary disease. Electronically Signed   By: Gerome Samavid  Williams III M.D   On: 08/24/2019 17:59   CT ANGIO NECK W OR WO CONTRAST  Result Date: 08/25/2019 CLINICAL DATA:  Stroke follow-up EXAM: CT ANGIOGRAPHY HEAD AND NECK TECHNIQUE: Multidetector CT imaging of the head and neck was performed using the standard protocol during bolus administration of intravenous contrast. Multiplanar CT image reconstructions and MIPs were obtained to evaluate the vascular anatomy. Carotid stenosis measurements (when applicable) are obtained utilizing NASCET criteria, using the distal internal carotid diameter as the denominator. CONTRAST:  75mL OMNIPAQUE IOHEXOL 350 MG/ML SOLN COMPARISON:  None. FINDINGS: CTA NECK FINDINGS SKELETON: There is no bony spinal canal stenosis. No lytic or blastic lesion. OTHER NECK: Normal pharynx, larynx and major salivary glands. No cervical lymphadenopathy. Unremarkable thyroid gland. UPPER CHEST: No pneumothorax or pleural effusion. No nodules or masses. AORTIC ARCH: There is mild calcific atherosclerosis of the aortic arch. There is no aneurysm, dissection or hemodynamically significant stenosis of the visualized portion of the aorta. Conventional 3 vessel aortic branching pattern. The visualized proximal subclavian arteries are widely patent. RIGHT CAROTID SYSTEM: Normal without aneurysm, dissection or stenosis. LEFT CAROTID SYSTEM: Normal without aneurysm, dissection or stenosis. VERTEBRAL ARTERIES: Left dominant configuration. The right vertebral artery is diffusely diminutive. The V1 segment is not visible. The left vertebral artery origin is normal. CTA HEAD FINDINGS POSTERIOR CIRCULATION: --Vertebral arteries: The right vertebral artery terminates in PICA. Left V4 segment is normal. --Inferior cerebellar arteries: Normal. --Basilar artery: Normal. --Superior cerebellar arteries:  Normal. --Posterior cerebral arteries (PCA): There is severe stenosis of the right P1 segment and proximal P2 segment. There is multifocal severe stenosis of the left P2 segment. The distal posterior cerebral arteries are both patent. ANTERIOR CIRCULATION: --Intracranial internal carotid arteries: Atherosclerotic calcification of the internal carotid arteries at the skull base without hemodynamically significant stenosis. --Anterior cerebral arteries (ACA): There is severe stenosis of the proximal right A1 segment. The anterior communicating artery is patent. Otherwise, both ACAs are normal. --Middle cerebral arteries (MCA): Normal. VENOUS SINUSES: As permitted by contrast timing, patent. ANATOMIC VARIANTS: None Review of the MIP images confirms the above findings. IMPRESSION: 1. No emergent large vessel occlusion. 2. 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 Multifocal severe left P2 segment stenosis. 3. Severe stenosis of the proximal right anterior cerebral artery A1 segment. 4. Diffusely diminutive right vertebral artery, which  terminates in PICA. 5. Aortic Atherosclerosis (ICD10-I70.0). Electronically Signed   By: Deatra Robinson M.D.   On: 08/25/2019 04:49   MR BRAIN WO CONTRAST  Result Date: 08/25/2019 CLINICAL DATA:  Encephalopathy EXAM: MRI HEAD WITHOUT CONTRAST TECHNIQUE: Multiplanar, multiecho pulse sequences of the brain and surrounding structures were obtained without intravenous contrast. COMPARISON:  08/01/2019 FINDINGS: BRAIN: No acute infarct, acute hemorrhage or extra-axial collection. Unchanged appearance of multiple old infarcts, including both occipital lobes and in the left basal ganglia. Normal volume of CSF spaces. Single focus of chronic microhemorrhage in the left periatrial region, unchanged. There is multifocal periventricular white matter hyperintensity, most often a result of chronic microvascular ischemia. Normal midline structures.  VASCULAR: Major flow voids are preserved. SKULL AND UPPER CERVICAL SPINE: Normal calvarium and skull base. Visualized upper cervical spine and soft tissues are normal. SINUSES/ORBITS: No paranasal sinus fluid levels or advanced mucosal thickening. No mastoid or middle ear effusion. Normal orbits. IMPRESSION: 1. No acute intracranial process. 2. Unchanged appearance of multiple old infarcts and findings of chronic small vessel ischemia. Electronically Signed   By: Deatra Robinson M.D.   On: 08/25/2019 05:41   CT HEAD CODE STROKE WO CONTRAST`  Result Date: 08/25/2019 CLINICAL DATA:  Code stroke.  Encephalopathy EXAM: CT HEAD WITHOUT CONTRAST TECHNIQUE: Contiguous axial images were obtained from the base of the skull through the vertex without intravenous contrast. COMPARISON:  None. FINDINGS: Brain: There is no mass, hemorrhage or extra-axial collection. The size and configuration of the ventricles and extra-axial CSF spaces are normal. There is hypoattenuation of the periventricular white matter, most commonly indicating chronic ischemic microangiopathy. There are old bilateral occipital infarcts and old small vessel infarcts of the left caudate head and corona radiata. Vascular: No abnormal hyperdensity of the major intracranial arteries or dural venous sinuses. No intracranial atherosclerosis. Skull: The visualized skull base, calvarium and extracranial soft tissues are normal. Sinuses/Orbits: No fluid levels or advanced mucosal thickening of the visualized paranasal sinuses. No mastoid or middle ear effusion. The orbits are normal. ASPECTS [facility] Stroke Program Early CT Score) - Ganglionic level infarction (caudate, lentiform nuclei, internal capsule, insula, M1-M3 cortex): 7 - Supraganglionic infarction (M4-M6 cortex): 3 Total score (0-10 with 10 being normal): 10 IMPRESSION: 1. No acute intracranial abnormality. 2. ASPECTS is 10. 3. 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 These  results were called by telephone at the time of interpretation on 08/25/2019 at 3:10 am to provider [facility] , who verbally acknowledged these results. Electronically Signed   By: Deatra Robinson M.D.   On: 08/25/2019 03:10    Medications:  I have reviewed the patient's current medications. Scheduled: .  stroke: mapping our early stages of recovery book   Does not apply Once  . amLODipine  10 mg Oral Daily  . aspirin EC  81 mg Oral QPM  . atorvastatin  80 mg Oral QPM  . clopidogrel  75 mg Oral Daily  . enoxaparin (LOVENOX) injection  40 mg Subcutaneous Q12H  . insulin aspart  0-15 Units Subcutaneous TID WC  . insulin aspart  0-5 Units Subcutaneous QHS  . metoprolol succinate  25 mg Oral Daily  . oxyCODONE-acetaminophen  1 tablet Oral Once    Assessment/Plan: 65 y.o. 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 Patient had been seen earlier in the emergency room with a complaint of uncontrolled blood pressure and related stress. She was treated and discharged only to return several hours later with the episode described above. Patient improved in the ED but not back to baseline.  MRI of the brain personally reviewed and shows no acute changes.  CTA shows severe right P1, proximal P2 and multifocal P2 segment, proximal A1 stenosis.  Right vertebral is small.  Patient today feeling poorly again.  Has been tachycardic but BP has dropped significantly in an attempt to treat hypertension.  With known posterior circulation stenosis can  not rule vertebrobasilar insufficiency due to hypoperfusion.    Recommendations: 1. Permissive BP management with SBP 150-170 3.  Continue ASA, Plavix and statin.  Would load Plavix and administer 300mg  now 4. Telemetry 5. Once nausea controlled and patient able to lie flat would obtain CT Perfusion imaging    LOS: 0 days   , MD Neurology [phone] 08/26/2019  10:33 AM

## 2019-08-26 NOTE — Progress Notes (Signed)
Patient ID: [name], female   DOB: [birth year], 64 y.o.   MRN: [id number]  [name] did not accept in transfer secondary to capacity issues at this time.  Spoke with Dr Lauris Chroman at [facility].  This was relayed to family at this time.  Dr. Alford Highland

## 2019-08-26 NOTE — Plan of Care (Signed)

## 2019-08-26 NOTE — Plan of Care (Signed)

## 2019-08-26 NOTE — Progress Notes (Signed)
Patient ID: Rolene ArbourCynthia B Fesler, female   DOB: 06/26/1955, 64 y.o.   MRN: [id number] [facility] PROGRESS NOTE  Duke SalviaCynthia B Dardis WUJ:[id number] DOB: 03/27/1955 DOA: 08/25/2019 PCP: Karie SchwalbeLetvak, Ger Nicks I, MD  HPI/Subjective: The patient and family are very upset.  The patient is not feeling well.  Had 2 episodes of this morning where she had shaking and some nausea.  She did not do well with the headache treatment yesterday.  She did not want a repeat any of those medications today.  She is still having headache.  Still having some nausea.  Not eating very well.  She is very sensitive to medications.  Family at the bedside.  [facility].  Unfortunately [facility] could not accept her in transfer secondary to bed capacity.  Objective: Vitals:   08/26/19 1118 08/26/19 1212  BP: (!) 170/86 (!) 154/70  Pulse: (!) 108 98  Resp: 20 17  Temp:  97.9 F (36.6 C)  SpO2:  100%    Intake/Output Summary (Last 24 hours) at 08/26/2019 1507 Last data filed at 08/26/2019 1010 Gross per 24 hour  Intake --  Output 150 ml  Net -150 ml   Filed Weights   08/25/19 2047 08/26/19 0402  Weight: 108.3 kg 108 kg    ROS: Review of Systems  Constitutional: Positive for malaise/fatigue.  Respiratory: Negative for shortness of breath.   Cardiovascular: Negative for chest pain.  Gastrointestinal: Positive for nausea.  Neurological: Positive for weakness and headaches.   Exam: Physical Exam HENT:     Head: Normocephalic.     Mouth/Throat:     Pharynx: No oropharyngeal exudate.  Eyes:     General: Lids are normal.     Conjunctiva/sclera: Conjunctivae normal.  Cardiovascular:     Rate and Rhythm: Normal rate and regular rhythm.     Heart sounds: Normal heart sounds, S1 normal and S2 normal.  Pulmonary:     Breath sounds: No decreased breath sounds, wheezing, rhonchi or rales.  Abdominal:     Palpations: Abdomen is soft.     Tenderness: There is no abdominal tenderness.   Musculoskeletal:     Right ankle: No swelling.     Left ankle: No swelling.  Skin:    General: Skin is warm.     Findings: No rash.  Neurological:     Mental Status: She is alert and oriented to person, place, and time.       Data Reviewed: Basic Metabolic Panel: Recent Labs  Lab 08/24/19 1726 08/25/19 0238 08/26/19 0456  NA 137 135 133*  K 3.7 4.0 4.7  CL 101 97* 99  CO2 21* 25 22  GLUCOSE 136* 235* 379*  BUN 22 23 33*  CREATININE 1.16* 1.33* 1.65*  CALCIUM 9.0 9.5 9.0   Liver Function Tests: Recent Labs  Lab 08/25/19 0238  AST 24  ALT 29  ALKPHOS 88  BILITOT 0.7  PROT 8.0  ALBUMIN 4.7   CBC: Recent Labs  Lab 08/24/19 1726 08/25/19 0238  WBC 7.2 7.2  NEUTROABS  --  3.9  HGB 14.0 14.7  HCT 41.4 43.1  MCV 90.8 89.6  PLT 255 245    CBG: Recent Labs  Lab 08/25/19 1341 08/25/19 1640 08/25/19 2050 08/26/19 0955 08/26/19 1149  GLUCAP 115* 135* 257* 388* 363*    Recent Results (from the past 240 hour(s))  SARS Coronavirus 2 by RT PCR (hospital order, [facility]) Nasopharyngeal  Status: None   Collection Time: 08/25/19  4:05 AM   Specimen: Nasopharyngeal  Result Value Ref Range Status   SARS Coronavirus 2 NEGATIVE NEGATIVE Final    Comment: (NOTE) SARS-CoV-2 target nucleic acids are NOT DETECTED.  The SARS-CoV-2 RNA is generally detectable in upper and lower respiratory specimens during the acute phase of infection. The lowest concentration of SARS-CoV-2 viral copies this assay can detect is 250 copies / mL. A negative result does not preclude SARS-CoV-2 infection and should not be used as the sole basis for treatment or other patient management decisions.  A negative result may occur with improper specimen collection / handling, submission of specimen other than nasopharyngeal swab, presence of viral mutation(s) within the areas targeted by this assay, and inadequate number of viral copies (<250 copies / mL). A  negative result must be combined with clinical observations, patient history, and epidemiological information.  Fact Sheet for Patients:   BoilerBrush.com.cy  Fact Sheet for Healthcare Providers: https://pope.com/  This test is not yet approved or  cleared by the [place] FDA and has been authorized for detection and/or diagnosis of SARS-CoV-2 by FDA under an Emergency Use Authorization (EUA).  This EUA will remain in effect (meaning this test can be used) for the duration of the COVID-19 declaration under Section 564(b)(1) of the Act, 21 U.S.C. section 360bbb-3(b)(1), unless the authorization is terminated or revoked sooner.  Performed at [facility], [address] [place], [place] [address]      Studies: CT ANGIO HEAD W OR WO CONTRAST  Result Date: 08/25/2019 CLINICAL DATA:  Stroke follow-up EXAM: CT ANGIOGRAPHY HEAD AND NECK TECHNIQUE: Multidetector CT imaging of the head and neck was performed using the standard protocol during bolus administration of intravenous contrast. Multiplanar CT image reconstructions and MIPs were obtained to evaluate the vascular anatomy. Carotid stenosis measurements (when applicable) are obtained utilizing NASCET criteria, using the distal internal carotid diameter as the denominator. CONTRAST:  58mL OMNIPAQUE IOHEXOL 350 MG/ML SOLN COMPARISON:  None. FINDINGS: CTA NECK FINDINGS SKELETON: There is no bony spinal canal stenosis. No lytic or blastic lesion. OTHER NECK: Normal pharynx, larynx and major salivary glands. No cervical lymphadenopathy. Unremarkable thyroid gland. UPPER CHEST: No pneumothorax or pleural effusion. No nodules or masses. AORTIC ARCH: There is mild calcific atherosclerosis of the aortic arch. There is no aneurysm, dissection or hemodynamically significant stenosis of the visualized portion of the aorta. Conventional 3 vessel aortic branching pattern. The visualized proximal  subclavian arteries are widely patent. RIGHT CAROTID SYSTEM: Normal without aneurysm, dissection or stenosis. LEFT CAROTID SYSTEM: Normal without aneurysm, dissection or stenosis. VERTEBRAL ARTERIES: Left dominant configuration. The right vertebral artery is diffusely diminutive. The V1 segment is not visible. The left vertebral artery origin is normal. CTA HEAD FINDINGS POSTERIOR CIRCULATION: --Vertebral arteries: The right vertebral artery terminates in PICA. Left V4 segment is normal. --Inferior cerebellar arteries: Normal. --Basilar artery: Normal. --Superior cerebellar arteries: Normal. --Posterior cerebral arteries (PCA): There is severe stenosis of the right P1 segment and proximal P2 segment. There is multifocal severe stenosis of the left P2 segment. The distal posterior cerebral arteries are both patent. ANTERIOR CIRCULATION: --Intracranial internal carotid arteries: Atherosclerotic calcification of the internal carotid arteries at the skull base without hemodynamically significant stenosis. --Anterior cerebral arteries (ACA): There is severe stenosis of the proximal right A1 segment. The anterior communicating artery is patent. Otherwise, both ACAs are normal. --Middle cerebral arteries (MCA): Normal. VENOUS SINUSES: As permitted by contrast timing, patent. ANATOMIC VARIANTS: None  Review of the MIP images confirms the above findings. IMPRESSION: 1. No emergent large vessel occlusion. 2. 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 Multifocal severe left P2 segment stenosis. 3. Severe stenosis of the proximal right anterior cerebral artery A1 segment. 4. Diffusely diminutive right vertebral artery, which terminates in PICA. 5. Aortic Atherosclerosis (ICD10-I70.0). Electronically Signed   By: Deatra Robinson M.D.   On: 08/25/2019 04:49   DG Chest 2 View  Result Date: 08/24/2019 CLINICAL DATA:  Chest ache. EXAM: CHEST - 2 VIEW COMPARISON:  March 11, 2019  FINDINGS: Stable cardiomegaly. The hila, mediastinum, lungs, and pleura are normal. IMPRESSION: No active cardiopulmonary disease. Electronically Signed   By: Gerome Sam III M.D   On: 08/24/2019 17:59   CT ANGIO NECK W OR WO CONTRAST  Result Date: 08/25/2019 CLINICAL DATA:  Stroke follow-up EXAM: CT ANGIOGRAPHY HEAD AND NECK TECHNIQUE: Multidetector CT imaging of the head and neck was performed using the standard protocol during bolus administration of intravenous contrast. Multiplanar CT image reconstructions and MIPs were obtained to evaluate the vascular anatomy. Carotid stenosis measurements (when applicable) are obtained utilizing NASCET criteria, using the distal internal carotid diameter as the denominator. CONTRAST:  75mL OMNIPAQUE IOHEXOL 350 MG/ML SOLN COMPARISON:  None. FINDINGS: CTA NECK FINDINGS SKELETON: There is no bony spinal canal stenosis. No lytic or blastic lesion. OTHER NECK: Normal pharynx, larynx and major salivary glands. No cervical lymphadenopathy. Unremarkable thyroid gland. UPPER CHEST: No pneumothorax or pleural effusion. No nodules or masses. AORTIC ARCH: There is mild calcific atherosclerosis of the aortic arch. There is no aneurysm, dissection or hemodynamically significant stenosis of the visualized portion of the aorta. Conventional 3 vessel aortic branching pattern. The visualized proximal subclavian arteries are widely patent. RIGHT CAROTID SYSTEM: Normal without aneurysm, dissection or stenosis. LEFT CAROTID SYSTEM: Normal without aneurysm, dissection or stenosis. VERTEBRAL ARTERIES: Left dominant configuration. The right vertebral artery is diffusely diminutive. The V1 segment is not visible. The left vertebral artery origin is normal. CTA HEAD FINDINGS POSTERIOR CIRCULATION: --Vertebral arteries: The right vertebral artery terminates in PICA. Left V4 segment is normal. --Inferior cerebellar arteries: Normal. --Basilar artery: Normal. --Superior cerebellar arteries:  Normal. --Posterior cerebral arteries (PCA): There is severe stenosis of the right P1 segment and proximal P2 segment. There is multifocal severe stenosis of the left P2 segment. The distal posterior cerebral arteries are both patent. ANTERIOR CIRCULATION: --Intracranial internal carotid arteries: Atherosclerotic calcification of the internal carotid arteries at the skull base without hemodynamically significant stenosis. --Anterior cerebral arteries (ACA): There is severe stenosis of the proximal right A1 segment. The anterior communicating artery is patent. Otherwise, both ACAs are normal. --Middle cerebral arteries (MCA): Normal. VENOUS SINUSES: As permitted by contrast timing, patent. ANATOMIC VARIANTS: None Review of the MIP images confirms the above findings. IMPRESSION: 1. No emergent large vessel occlusion. 2. 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 Multifocal severe left P2 segment stenosis. 3. Severe stenosis of the proximal right anterior cerebral artery A1 segment. 4. Diffusely diminutive right vertebral artery, which terminates in PICA. 5. Aortic Atherosclerosis (ICD10-I70.0). Electronically Signed   By: Deatra Robinson M.D.   On: 08/25/2019 04:49   MR BRAIN WO CONTRAST  Result Date: 08/25/2019 CLINICAL DATA:  Encephalopathy EXAM: MRI HEAD WITHOUT CONTRAST TECHNIQUE: Multiplanar, multiecho pulse sequences of the brain and surrounding structures were obtained without intravenous contrast. COMPARISON:  08/01/2019 FINDINGS: BRAIN: No acute infarct, acute hemorrhage or  extra-axial collection. Unchanged appearance of multiple old infarcts, including both occipital lobes and in the left basal ganglia. Normal volume of CSF spaces. Single focus of chronic microhemorrhage in the left periatrial region, unchanged. There is multifocal periventricular white matter hyperintensity, most often a result of chronic microvascular ischemia. Normal midline structures.  VASCULAR: Major flow voids are preserved. SKULL AND UPPER CERVICAL SPINE: Normal calvarium and skull base. Visualized upper cervical spine and soft tissues are normal. SINUSES/ORBITS: No paranasal sinus fluid levels or advanced mucosal thickening. No mastoid or middle ear effusion. Normal orbits. IMPRESSION: 1. No acute intracranial process. 2. Unchanged appearance of multiple old infarcts and findings of chronic small vessel ischemia. Electronically Signed   By: Deatra Robinson M.D.   On: 08/25/2019 05:41   CT HEAD CODE STROKE WO CONTRAST`  Result Date: 08/25/2019 CLINICAL DATA:  Code stroke.  Encephalopathy EXAM: CT HEAD WITHOUT CONTRAST TECHNIQUE: Contiguous axial images were obtained from the base of the skull through the vertex without intravenous contrast. COMPARISON:  None. FINDINGS: Brain: There is no mass, hemorrhage or extra-axial collection. The size and configuration of the ventricles and extra-axial CSF spaces are normal. There is hypoattenuation of the periventricular white matter, most commonly indicating chronic ischemic microangiopathy. There are old bilateral occipital infarcts and old small vessel infarcts of the left caudate head and corona radiata. Vascular: No abnormal hyperdensity of the major intracranial arteries or dural venous sinuses. No intracranial atherosclerosis. Skull: The visualized skull base, calvarium and extracranial soft tissues are normal. Sinuses/Orbits: No fluid levels or advanced mucosal thickening of the visualized paranasal sinuses. No mastoid or middle ear effusion. The orbits are normal. ASPECTS [facility] Stroke Program Early CT Score) - Ganglionic level infarction (caudate, lentiform nuclei, internal capsule, insula, M1-M3 cortex): 7 - Supraganglionic infarction (M4-M6 cortex): 3 Total score (0-10 with 10 being normal): 10 IMPRESSION: 1. No acute intracranial abnormality. 2. ASPECTS is 10. 3. 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 These  results were called by telephone at the time of interpretation on 08/25/2019 at 3:10 am to provider [facility] , who verbally acknowledged these results. Electronically Signed   By: Deatra Robinson M.D.   On: 08/25/2019 03:10    Scheduled Meds: .  stroke: mapping our early stages of recovery book   Does not apply Once  . amLODipine  10 mg Oral Daily  . aspirin EC  81 mg Oral QPM  . atorvastatin  80 mg Oral QPM  . clopidogrel  75 mg Oral Daily  . enoxaparin (LOVENOX) injection  40 mg Subcutaneous Q12H  . insulin aspart  0-15 Units Subcutaneous TID WC  . insulin aspart  0-5 Units Subcutaneous QHS  . insulin glargine  8 Units Subcutaneous Daily  . metoprolol succinate  25 mg Oral Daily  . pantoprazole (PROTONIX) IV  40 mg Intravenous Q12H   Continuous Infusions: . sodium chloride 50 mL/hr at 08/25/19 1601    Assessment/Plan:  1. Acute metabolic encephalopathy with the inability to speak and total body weakness prior to coming in.  MRI of the brain negative for acute stroke but did see old strokes.  Patient does have some stenosis on CT angiogram.  Appreciate neurology evaluation.  They bumped up Plavix to 300 mg for today and back to 75 mg for tomorrow.  Continue aspirin and atorvastatin.  Mental status is better and speaking ability is better. 2. Persistent nausea and headache.  Tylenol for headache.  As needed Zofran for nausea.  Try to limit medications.  3. Acute kidney injury.  Continue IV fluid hydration.  Avoid hypotension. 4. Essential hypertension.  Blood pressure elevated today.  We will send off a 24-hour urine for metanephrines.  We will also send off serum metanephrines.  Restarted Norvasc and Toprol. 5. Type 2 diabetes mellitus with hyperlipidemia.  Continue statin.  Holding oral medications try low-dose Lantus and sliding scale. 6. Anxiety increased frequency of Xanax. 7. Weakness.  Baseline right-sided weakness with prior strokes physical therapy evaluation 8. History of  GERD.  Will give IV Protonix    Code Status:     Code Status Orders  (From admission, onward)         Start     Ordered   08/25/19 0423  Full code  Continuous        08/25/19 0426        Code Status History    Date Active Date Inactive Code Status Order ID Comments User Context   08/01/2019 0809 08/02/2019 1904 Full Code [id number]  Lorretta Harp, MD ED   06/09/2019 1730 06/10/2019 2131 Full Code [id number]  Lorretta Harp, MD Inpatient   08/04/2018 0216 08/05/2018 1532 Full Code [id number]  Pearletha Alfred, NP Inpatient   07/10/2014 0923 07/11/2014 1845 Full Code [id number]  Arnaldo Natal, MD ED   Advance Care Planning Activity     Family Communication: Spoke with daughter and son at the bedside Disposition Plan: Status is: Inpatient  Dispo: The patient is from: Home              Anticipated d/c is to: Home              Anticipated d/c date is: Likely in a few days.  Patient will need to be able to tolerate diet and have symptoms better prior to disposition.              Patient currently being treated for persistent nausea and headache.  Also has acute kidney injury.  Consultants:  Neurology  Time spent: 34 minutes in coordination of care speaking with neurology, nursing staff and calling [facility] transfer center and speaking with Dr. Lauris Chroman.  Chastelyn Athens [employer]  [facility] [employer]

## 2019-08-26 NOTE — Progress Notes (Signed)
PT Cancellation Note  Patient Details Name: Tami Jones MRN: [id number] DOB: 1955/05/07   Cancelled Treatment:    Reason Eval/Treat Not Completed: Other (comment).  Continued nausea and feeling bad, will reattempt later.     Ivar Drape 08/26/2019, 9:40 AM   Samul Dada, PT MS Acute Rehab Dept. Number: [facility] [id number] and [facility] [phone]

## 2019-08-26 NOTE — Progress Notes (Signed)
:  pt stated she "does not feel right, something is wrong" nauseated BP 189/83 HR 111 PRN zofran administered. MD notified.

## 2019-08-26 NOTE — Telephone Encounter (Signed)
She is in the hospital now and I will follow up on Monday

## 2019-08-27 ENCOUNTER — Encounter: Payer: Self-pay | Admitting: Internal Medicine

## 2019-08-27 ENCOUNTER — Inpatient Hospital Stay: Payer: 59

## 2019-08-27 DIAGNOSIS — N189 Chronic kidney disease, unspecified: Secondary | ICD-10-CM

## 2019-08-27 DIAGNOSIS — N179 Acute kidney failure, unspecified: Secondary | ICD-10-CM

## 2019-08-27 LAB — BASIC METABOLIC PANEL
Anion gap: 7 (ref 5–15)
BUN: 35 mg/dL — ABNORMAL HIGH (ref 8–23)
CO2: 25 mmol/L (ref 22–32)
Calcium: 8.4 mg/dL — ABNORMAL LOW (ref 8.9–10.3)
Chloride: 106 mmol/L (ref 98–111)
Creatinine, Ser: 1.31 mg/dL — ABNORMAL HIGH (ref 0.44–1.00)
GFR calc Af Amer: 50 mL/min — ABNORMAL LOW (ref >60)
GFR calc non Af Amer: 44 mL/min — ABNORMAL LOW (ref >60)
Glucose, Bld: 197 mg/dL — ABNORMAL HIGH (ref 70–99)
Potassium: 4.3 mmol/L (ref 3.5–5.1)
Sodium: 138 mmol/L (ref 135–145)

## 2019-08-27 LAB — GLUCOSE, CAPILLARY
Glucose-Capillary: 151 mg/dL — ABNORMAL HIGH (ref 70–99)
Glucose-Capillary: 168 mg/dL — ABNORMAL HIGH (ref 70–99)
Glucose-Capillary: 131 mg/dL — ABNORMAL HIGH (ref 70–99)
Glucose-Capillary: 165 mg/dL — ABNORMAL HIGH (ref 70–99)

## 2019-08-27 MED ORDER — AMLODIPINE BESYLATE 10 MG PO TABS
10.0000 mg | ORAL_TABLET | Freq: Every evening | ORAL | Status: DC
  Administered 2019-08-27: 10 mg via ORAL
  Filled 2019-08-27: qty 1

## 2019-08-27 MED ORDER — IOHEXOL 350 MG/ML SOLN
40.0000 mL | Freq: Once | INTRAVENOUS | Status: AC | PRN
  Administered 2019-08-27: 40 mL via INTRAVENOUS

## 2019-08-27 NOTE — Progress Notes (Signed)
CH visited pt. per RN referral for emotional support for pt. and family.  Pt. lying down in bed w/dtr. and son at bedside; dtr. shared pt.  was brought to ED on Friday 'unresponsive' with an issue in blood pressure by family --> discharged that day but then had to be brought back 2hrs later by EMS.  Pt. suffered a stroke last June and this caused her to seek early retirement in January after working 20+ years at [employer]; since then, son says pt. has been back to the hospital several times fir issues related to blood pressure and panic attacks.  Pt. says she misses being busy with work but also shared that her work had become increasingly stressful; she struggles to stay busy in retirement and seems to be especially irritated by having to be bedbound the last two weeks.  Pt. and dtr. appeared worried; medical team have not yet been able to offer a reason for pt.'s change in blood pressure and pt. says she will not leave the hospital until she is satisfied that the team understands what is at the root of these issues.  Pt. taken to CT scan --> CH remains available as needed.

## 2019-08-27 NOTE — Telephone Encounter (Signed)
[facility] Day - Client TELEPHONE ADVICE RECORD [facility] Patient Name: Tami Jones Gender: Female DOB: [birth year] Age: 64 Y 8 M 4 D Return Phone Number: [phone] (Primary) Address: City/State/Zip: [place] [address] Client [facility] Day - Client Client Site [facility] - Day Contact Type Call Who Is Calling Patient / Member / Family / Caregiver Call Type Triage / Clinical Relationship To Patient Self Return Phone Number [phone] (Primary) Chief Complaint Dizziness Reason for Call Symptomatic / Request for Health Information Initial Comment Caller stated she is feeling dizzy, recent BP 159/116. GOTO Facility Not Listed [facility] ED Translation No Nurse Assessment Nurse: Daphine Deutscher, RN, Vikki Ports Date/Time Lamount Cohen Time): 08/24/2019 4:59:43 PM Confirm and document reason for call. If symptomatic, describe symptoms. ---Caller stated she is feeling dizzy, and her most recent BP 173/120. She doesn't feel well. She is taking her BP meds appropriately. She feels tight in her chest and has a headache. She has a history of strokes. Has the patient had close contact with a person known or suspected to have the novel coronavirus illness OR traveled / lives in area with major community spread (including international travel) in the last 14 days from the onset of symptoms? * If Asymptomatic, screen for exposure and travel within the last 14 days. ---No Does the patient have any new or worsening symptoms? ---Yes Will a triage be completed? ---Yes Related visit to physician within the last 2 weeks? ---No Does the PT have any chronic conditions? (i.e. diabetes, asthma, this includes High risk factors for pregnancy, etc.) ---Yes List chronic conditions. ---diabetes, HTN Is this a behavioral health or substance abuse call? ---No Guidelines Guideline Title Affirmed Question Affirmed Notes Nurse Date/Time  (Eastern Time) Blood Pressure - High [1] Systolic BP >= 160 OR Diastolic >= 100 AND [2] cardiac or neurologic symptoms (e.g., chest pain, difficulty Boneta Lucks 08/24/2019 5:01:30 PMPLEASE NOTE: All timestamps contained within this report are represented as [place] Standard Time. CONFIDENTIALTY NOTICE: This fax transmission is intended only for the addressee. It contains information that is legally privileged, confidential or otherwise protected from use or disclosure. If you are not the intended recipient, you are strictly prohibited from reviewing, disclosing, copying using or disseminating any of this information or taking any action in reliance on or regarding this information. If you have received this fax in error, please notify us immediately by telephone so that we can arrange for its return to [place]. Phone: [phone], Toll-Free: [phone], Fax: [phone] Page: 2 of 2 Call Id: [id number] Guidelines Guideline Title Affirmed Question Affirmed Notes Nurse Date/Time Lamount Cohen Time) breathing, unsteady gait, blurred vision) Disp. Time Lamount Cohen Time) Disposition Final User 08/24/2019 4:45:39 PM Attempt made - no message left Boneta Lucks 08/24/2019 5:05:04 PM Go to ED Now Yes Daphine Deutscher, RN, Mamie Nick Disagree/Comply Comply Caller Understands Yes PreDisposition Did not know what to do Care Advice Given Per Guideline GO TO ED NOW: * You need to be seen in the Emergency Department. NOTE TO [facility] - DRIVING: * Another adult should drive. CARE ADVICE given per High Blood Pressure (Adult) guideline. Referrals GO TO FACILITY OTHER - SPECIFY

## 2019-08-27 NOTE — Procedures (Signed)
ELECTROENCEPHALOGRAM REPORT   Patient: Tami Jones       Room #: 240A-AA EEG No. ID: [id number] Age: 64 y.o.        Sex: female Requesting Physician: Renae Gloss Report Date:  08/27/2019        Interpreting Physician: Thana Farr  History: Tami Jones is an 64 y.o. female with an episode of altered mental status  Medications:  ASA, Norvasc, Lipitor, Plavix, Insulin, Toprol  Conditions of Recording:  This is a 21 channel routine scalp EEG performed with bipolar and monopolar montages arranged in accordance to the international 10/20 system of electrode placement. One channel was dedicated to EKG recording.  The patient is in the awake state.  Description:  The waking background activity consists of a low voltage, symmetrical, fairly well organized, 9 Hz alpha activity, seen from the parieto-occipital and posterior temporal regions.  Low voltage fast activity, poorly organized, is seen anteriorly and is at times superimposed on more posterior regions.  A mixture of theta and alpha rhythms are seen from the central and temporal regions. The patient does not drowse or sleep. No epileptiform activity is noted.   Hyperventilation was not performed.  Intermittent photic stimulation was performed but failed to illicit any change in the tracing.    IMPRESSION: Normal electroencephalogram, awake and with activation procedures. There are no focal lateralizing or epileptiform features.   Thana Farr, MD Neurology [phone] 08/27/2019, 7:09 PM

## 2019-08-27 NOTE — Plan of Care (Signed)

## 2019-08-27 NOTE — Progress Notes (Signed)
PT Cancellation Note  Patient Details Name: Tami Jones MRN: [id number] DOB: 1955-07-29   Cancelled Treatment:    Reason Eval/Treat Not Completed: Other (comment). Evaluation attempted for 3rd time. Pt currently off unit for testing. Will re-attempt tomorrow.   Rip Hawes 08/27/2019, 2:14 PM Elizabeth Palau, PT, DPT [phone]

## 2019-08-27 NOTE — Progress Notes (Signed)
Subjective: Patient with less nausea and vomiting today.  Continues to complain of headache that is on the left and occipital bilaterally.    Objective: Current vital signs: BP 128/72 (BP Location: Left Arm)    Pulse 67    Temp 98.4 F (36.9 C)    Resp 18    Ht 5\' 4"  (1.626 m)    Wt 108.6 kg    SpO2 97%    BMI 41.09 kg/m  Vital signs in last 24 hours: Temp:  [97.7 F (36.5 C)-98.4 F (36.9 C)] 98.4 F (36.9 C) (07/12 1220) Pulse Rate:  [59-95] 67 (07/12 1220) Resp:  [16-18] 18 (07/12 1220) BP: (110-176)/(55-72) 128/72 (07/12 1220) SpO2:  [93 %-100 %] 97 % (07/12 1220) Weight:  [108.6 kg] 108.6 kg (07/12 0339)  Intake/Output from previous day: 07/11 0701 - 07/12 0700 In: 133.8 [I.V.:133.8] Out: 600 [Urine:600] Intake/Output this shift: Total I/O In: 240 [P.O.:240] Out: 650 [Urine:650] Nutritional status:  Diet Order            Diet heart healthy/carb modified Room service appropriate? Yes; Fluid consistency: Thin  Diet effective now                 Neurologic Exam: Mental Status: Alertand awake.Less than cooperative due to feeling poorly. Cranial Nerves: II: Visual fields grossly normal, pupils equal, round, reactive to light and accommodation III,IV, VI: ptosis not present, extra-ocular motions intact bilaterally V,VII: smile symmetric, facial light touch sensation normal bilaterally VIII: hearing normal bilaterally IX,X: gag reflex present XI: bilateral shoulder shrug XII: midline tongue extension Motor: Moves both upper extremities well against gravity.  Moves upper extremities stronger than lower extremities but still able to lift off bed.     Lab Results: Basic Metabolic Panel: Recent Labs  Lab 08/24/19 1726 08/24/19 1726 08/25/19 0238 08/26/19 0456 08/27/19 0447  NA 137  --  135 133* 138  K 3.7  --  4.0 4.7 4.3  CL 101  --  97* 99 106  CO2 21*  --  25 22 25   GLUCOSE 136*  --  235* 379* 197*  BUN 22  --  23 33* 35*  CREATININE 1.16*  --   1.33* 1.65* 1.31*  CALCIUM 9.0   < > 9.5 9.0 8.4*   < > = values in this interval not displayed.    Liver Function Tests: Recent Labs  Lab 08/25/19 0238  AST 24  ALT 29  ALKPHOS 88  BILITOT 0.7  PROT 8.0  ALBUMIN 4.7   No results for input(s): LIPASE, AMYLASE in the last 168 hours. No results for input(s): AMMONIA in the last 168 hours.  CBC: Recent Labs  Lab 08/24/19 1726 08/25/19 0238  WBC 7.2 7.2  NEUTROABS  --  3.9  HGB 14.0 14.7  HCT 41.4 43.1  MCV 90.8 89.6  PLT 255 245    Cardiac Enzymes: No results for input(s): CKTOTAL, CKMB, CKMBINDEX, TROPONINI in the last 168 hours.  Lipid Panel: Recent Labs  Lab 08/25/19 0238  CHOL 233*  TRIG 151*  HDL 44  CHOLHDL 5.3  VLDL 30  LDLCALC 10/26/19*    CBG: Recent Labs  Lab 08/26/19 1149 08/26/19 1643 08/26/19 2052 08/27/19 0815 08/27/19 1219  GLUCAP 363* 258* 184* 151* 168*    Microbiology: Results for orders placed or performed during the hospital encounter of 08/25/19  SARS Coronavirus 2 by RT PCR (hospital order, performed in [facility] hospital lab) Nasopharyngeal     Status: None  Collection Time: 08/25/19  4:05 AM   Specimen: Nasopharyngeal  Result Value Ref Range Status   SARS Coronavirus 2 NEGATIVE NEGATIVE Final    Comment: (NOTE) SARS-CoV-2 target nucleic acids are NOT DETECTED.  The SARS-CoV-2 RNA is generally detectable in upper and lower respiratory specimens during the acute phase of infection. The lowest concentration of SARS-CoV-2 viral copies this assay can detect is 250 copies / mL. A negative result does not preclude SARS-CoV-2 infection and should not be used as the sole basis for treatment or other patient management decisions.  A negative result may occur with improper specimen collection / handling, submission of specimen other than nasopharyngeal swab, presence of viral mutation(s) within the areas targeted by this assay, and inadequate number of viral copies (<250 copies /  mL). A negative result must be combined with clinical observations, patient history, and epidemiological information.  Fact Sheet for Patients:   BoilerBrush.com.cy  Fact Sheet for Healthcare Providers: https://pope.com/  This test is not yet approved or  cleared by the [place] FDA and has been authorized for detection and/or diagnosis of SARS-CoV-2 by FDA under an Emergency Use Authorization (EUA).  This EUA will remain in effect (meaning this test can be used) for the duration of the COVID-19 declaration under Section 564(b)(1) of the Act, 21 U.S.C. section 360bbb-3(b)(1), unless the authorization is terminated or revoked sooner.  Performed at [facility], [address] [place], [place] [address]     Coagulation Studies: Recent Labs    08/25/19 0238  LABPROT 11.8  INR 0.9    Imaging: No results found.  Medications:  I have reviewed the patient's current medications. 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 Patient had been seen earlier in the emergency room with a complaint of uncontrolled blood pressure and related stress. She was treated and discharged only to return several hours later with the episode described above.Patient improved in the ED but not back to baseline. MRI of the brain personally reviewed and shows no acute changes. CTA shows severe right P1, proximal P2 and multifocal P2 segment, proximal A1 stenosis. Right vertebral is small. Patient improved from yesterday but still complains of headache.  With known posterior circulation stenosis can not rule vertebrobasilar insufficiency due to hypoperfusion.    Recommendations: 1. Continue ASA, Plavix and statin.  Would load Plavix and administer 300mg  now 2. Telemetry 3. CT Perfusion imaging    LOS: 1  day   Thana Farr, MD Neurology [phone] 08/27/2019  12:40 PM

## 2019-08-27 NOTE — Progress Notes (Signed)
OT Cancellation Note  Patient Details Name: ADRINA ARMIJO MRN: [id number] DOB: 06-17-1955   Cancelled Treatment:    Reason Eval/Treat Not Completed: Patient at procedure or test/ unavailable. Upon attempt, pt out of room for testing. Will re-attempt at later date/time as pt is available and medically appropriate.   Richrd Prime, MPH, MS, OTR/L ascom [phone] 08/27/19, 2:19 PM

## 2019-08-27 NOTE — Progress Notes (Signed)
Patient ID: Tami Jones, female   DOB: Jun 19, 1955, 64 y.o.   MRN: [id number] [facility] PROGRESS NOTE  Jacolyn Joaquin Ontko LTJ:[id number] DOB: 12-25-1955 DOA: 08/25/2019 PCP: Karie Schwalbe, MD  HPI/Subjective: Patient was admitted with unresponsive episode with total body weakness and unable to talk.  Currently talking better but still not feeling great.  Still having a little headache.  More on the left side.  Still feels weak.  Some nausea but she did eat.  Had another episode last night but went back to sleep.  Objective: Vitals:   08/27/19 0816 08/27/19 1220  BP: 133/65 128/72  Pulse: (!) 59 67  Resp: 18 18  Temp: 98.2 F (36.8 C) 98.4 F (36.9 C)  SpO2: 93% 97%    Intake/Output Summary (Last 24 hours) at 08/27/2019 1340 Last data filed at 08/27/2019 0950 Gross per 24 hour  Intake 373.82 ml  Output 1250 ml  Net -876.18 ml   Filed Weights   08/25/19 2047 08/26/19 0402 08/27/19 0339  Weight: 108.3 kg 108 kg 108.6 kg    ROS: Review of Systems  Constitutional: Positive for malaise/fatigue.  Respiratory: Negative for cough and shortness of breath.   Gastrointestinal: Positive for nausea.  Neurological: Positive for headaches.   Exam: Physical Exam HENT:     Nose: No mucosal edema.     Mouth/Throat:     Pharynx: No oropharyngeal exudate.  Eyes:     General: Lids are normal.     Conjunctiva/sclera: Conjunctivae normal.     Pupils: Pupils are equal, round, and reactive to light.  Cardiovascular:     Rate and Rhythm: Normal rate and regular rhythm.     Heart sounds: S1 normal and S2 normal. No murmur heard.   Pulmonary:     Breath sounds: No decreased breath sounds, wheezing, rhonchi or rales.  Abdominal:     General: Bowel sounds are normal.     Palpations: Abdomen is soft.     Tenderness: There is no abdominal tenderness.  Musculoskeletal:     Right ankle: No swelling.     Left ankle: No swelling.  Skin:    General: Skin is warm.     Findings: No rash.   Neurological:     Mental Status: She is alert and oriented to person, place, and time.     Comments: No tremor seen this morning.       Data Reviewed: Basic Metabolic Panel: Recent Labs  Lab 08/24/19 1726 08/25/19 0238 08/26/19 0456 08/27/19 0447  NA 137 135 133* 138  K 3.7 4.0 4.7 4.3  CL 101 97* 99 106  CO2 21* 25 22 25   GLUCOSE 136* 235* 379* 197*  BUN 22 23 33* 35*  CREATININE 1.16* 1.33* 1.65* 1.31*  CALCIUM 9.0 9.5 9.0 8.4*   Liver Function Tests: Recent Labs  Lab 08/25/19 0238  AST 24  ALT 29  ALKPHOS 88  BILITOT 0.7  PROT 8.0  ALBUMIN 4.7   CBC: Recent Labs  Lab 08/24/19 1726 08/25/19 0238  WBC 7.2 7.2  NEUTROABS  --  3.9  HGB 14.0 14.7  HCT 41.4 43.1  MCV 90.8 89.6  PLT 255 245    CBG: Recent Labs  Lab 08/26/19 1149 08/26/19 1643 08/26/19 2052 08/27/19 0815 08/27/19 1219  GLUCAP 363* 258* 184* 151* 168*    Recent Results (from the past 240 hour(s))  SARS Coronavirus 2 by RT PCR (hospital order, performed in [facility] hospital lab) Nasopharyngeal  Status: None   Collection Time: 08/25/19  4:05 AM   Specimen: Nasopharyngeal  Result Value Ref Range Status   SARS Coronavirus 2 NEGATIVE NEGATIVE Final    Comment: (NOTE) SARS-CoV-2 target nucleic acids are NOT DETECTED.  The SARS-CoV-2 RNA is generally detectable in upper and lower respiratory specimens during the acute phase of infection. The lowest concentration of SARS-CoV-2 viral copies this assay can detect is 250 copies / mL. A negative result does not preclude SARS-CoV-2 infection and should not be used as the sole basis for treatment or other patient management decisions.  A negative result may occur with improper specimen collection / handling, submission of specimen other than nasopharyngeal swab, presence of viral mutation(s) within the areas targeted by this assay, and inadequate number of viral copies (<250 copies / mL). A negative result must be combined with  clinical observations, patient history, and epidemiological information.  Fact Sheet for Patients:   BoilerBrush.com.cy  Fact Sheet for Healthcare Providers: https://pope.com/  This test is not yet approved or  cleared by the [place] FDA and has been authorized for detection and/or diagnosis of SARS-CoV-2 by FDA under an Emergency Use Authorization (EUA).  This EUA will remain in effect (meaning this test can be used) for the duration of the COVID-19 declaration under Section 564(b)(1) of the Act, 21 U.S.C. section 360bbb-3(b)(1), unless the authorization is terminated or revoked sooner.  Performed at [facility], [address] [place], [place] [address]       Scheduled Meds: .  stroke: mapping our early stages of recovery book   Does not apply Once  . amLODipine  10 mg Oral QPM  . aspirin EC  81 mg Oral QPM  . atorvastatin  80 mg Oral QPM  . clopidogrel  75 mg Oral Daily  . enoxaparin (LOVENOX) injection  40 mg Subcutaneous Q12H  . insulin aspart  0-15 Units Subcutaneous TID WC  . insulin aspart  0-5 Units Subcutaneous QHS  . insulin glargine  8 Units Subcutaneous Daily  . metoprolol succinate  25 mg Oral Daily  . pantoprazole (PROTONIX) IV  40 mg Intravenous Q12H   Continuous Infusions: . sodium chloride 50 mL/hr at 08/27/19 0907    Assessment/Plan:  1. Acute metabolic encephalopathy with the inability to speak and total body weakness prior to coming in.  MRI of the brain negative for acute strokes but did see old strokes.  Patient does have some stenosis is seen on CT angiogram.  Vertebrobasilar insufficiency could be a possibility.  Appreciate neurology consultation.  Continue aspirin, Plavix and atorvastatin.  Mental status and ability to speak is better.  Patient still not feeling well.  Neurology ordered a CT perfusion study.  EEG was ordered over the weekend but not done over the  weekend. 2. Persistent nausea and headache.  Patient did not do well with medications for headache the other day.  Tylenol only for headache.  As needed Zofran for nausea.  Patient ate better this morning. 3. Acute kidney injury on chronic kidney disease stage IIIa..  Creatinine peaked at 1.65.  Creatinine down to 1.31 today.  Patient does have an element of chronic kidney disease stage IIIa.  Continue IV fluid hydration today. 4. Essential hypertension.  Initially blood pressure was on the lower side and held her medications.  Yesterday we had to restart her medications.  Since she states that her blood pressure is high usually in the evenings I will continue Toprol-XL in the morning and Norvasc  in the evening.  Holding lisinopril HCT at this time.  Patient thinks that this medication was not doing well for her. 5. Type 2 diabetes mellitus with hyperlipidemia.  Continue statin.  Holding oral medications at this time.  Low-dose Lantus for now.  Likely can go back on oral medication upon discharge. 6. Anxiety increased dose of Xanax frequency yesterday.  Patient not interested in going on medications on a daily basis to reduce anxiety at this time. 7. Weakness.  Physical therapy evaluation 8. History of GERD.  Will give IV Protonix 9. We also talked about medications for diabetic gastroparesis but will hold off on that at this point since the patient has multiple drug sensitivities.    Code Status:     Code Status Orders  (From admission, onward)         Start     Ordered   08/25/19 0423  Full code  Continuous        08/25/19 0426        Code Status History    Date Active Date Inactive Code Status Order ID Comments User Context   08/01/2019 0809 08/02/2019 1904 Full Code [id number]  Lorretta Harp, MD ED   06/09/2019 1730 06/10/2019 2131 Full Code [id number]  Lorretta Harp, MD Inpatient   08/04/2018 0216 08/05/2018 1532 Full Code [id number]  Pearletha Alfred, NP Inpatient   07/10/2014 0923 07/11/2014 1845  Full Code [id number]  Arnaldo Natal, MD ED   Advance Care Planning Activity     Family Communication: Spoke with son at the bedside Disposition Plan: Status is: Inpatient  Dispo: The patient is from: Home              Anticipated d/c is to: Home              Anticipated d/c date is: Earliest potential will be tomorrow based on clinical course.              Patient currently treated for acute metabolic encephalopathy, headache, acute kidney injury, variable blood pressures.  Consultants:  Neurology  Time spent: 28 minutes Case discussed with son and nursing staff at the bedside  [employer]  [facility]

## 2019-08-27 NOTE — Progress Notes (Signed)
eeg completed ° °

## 2019-08-28 LAB — BASIC METABOLIC PANEL
Anion gap: 8 (ref 5–15)
BUN: 28 mg/dL — ABNORMAL HIGH (ref 8–23)
CO2: 27 mmol/L (ref 22–32)
Calcium: 8.4 mg/dL — ABNORMAL LOW (ref 8.9–10.3)
Chloride: 106 mmol/L (ref 98–111)
Creatinine, Ser: 1.22 mg/dL — ABNORMAL HIGH (ref 0.44–1.00)
GFR calc Af Amer: 55 mL/min — ABNORMAL LOW (ref >60)
GFR calc non Af Amer: 47 mL/min — ABNORMAL LOW (ref >60)
Glucose, Bld: 130 mg/dL — ABNORMAL HIGH (ref 70–99)
Potassium: 4.1 mmol/L (ref 3.5–5.1)
Sodium: 141 mmol/L (ref 135–145)

## 2019-08-28 LAB — GLUCOSE, CAPILLARY
Glucose-Capillary: 124 mg/dL — ABNORMAL HIGH (ref 70–99)
Glucose-Capillary: 170 mg/dL — ABNORMAL HIGH (ref 70–99)

## 2019-08-28 MED ORDER — PANTOPRAZOLE SODIUM 40 MG PO TBEC: 40.0000 mg | DELAYED_RELEASE_TABLET | Freq: Two times a day (BID) | ORAL | Status: DC

## 2019-08-28 MED ORDER — PANTOPRAZOLE SODIUM 40 MG PO TBEC: 40.0000 mg | DELAYED_RELEASE_TABLET | Freq: Two times a day (BID) | ORAL | 0 refills | Status: DC

## 2019-08-28 MED ORDER — ATORVASTATIN CALCIUM 80 MG PO TABS: 80.0000 mg | ORAL_TABLET | Freq: Every evening | ORAL | Status: DC

## 2019-08-28 MED ORDER — ALPRAZOLAM 0.25 MG PO TABS: 0.2500 mg | ORAL_TABLET | Freq: Three times a day (TID) | ORAL | 0 refills | Status: DC | PRN

## 2019-08-28 MED ORDER — AMLODIPINE BESYLATE 10 MG PO TABS
10.0000 mg | ORAL_TABLET | Freq: Every evening | ORAL | 3 refills | Status: DC
Start: 2019-08-28 — End: 2022-03-30

## 2019-08-28 MED ORDER — CLOPIDOGREL BISULFATE 75 MG PO TABS: 75.0000 mg | ORAL_TABLET | Freq: Every day | ORAL | 0 refills | Status: DC

## 2019-08-28 NOTE — Discharge Summary (Signed)
[facility] [place] [facility]   PATIENT NAME: Tami Jones    MR#:  [id number]  DATE OF BIRTH:  Apr 10, 1955  DATE OF ADMISSION:  08/25/2019 ADMITTING PHYSICIAN: Alford Highland, MD  DATE OF DISCHARGE: 08/28/2019  2:13 PM  PRIMARY CARE PHYSICIAN: Karie Schwalbe, MD    ADMISSION DIAGNOSIS:  Encephalopathy [G93.40] Acute focal neurologic deficit with partial resolution [R29.818] Nausea & vomiting [R11.2]  DISCHARGE DIAGNOSIS:  Discharge diagnosis and hospital course. 1.  Acute metabolic encephalopathy with the inability to speak and total body weakness prior to coming in.  The patient had an MRI of the brain that was negative for acute strokes but did see old strokes.  Patient does have some stenosis seen on CT angiogram.  CT perfusion study was negative.  EEG was negative.  Seen by neurology.  Continue aspirin, Plavix and atorvastatin.  Mental status and ability to speak is better.  Trying to avoid hypotension and hypoperfusion of the brain.  Also trying to avoid severe hypertension. 2.  Persistent nausea and headache.  The patient did not do well with the acute medications for headache.  I tried magnesium, Toradol, Depakote and Decadron.  She did not want any further trials of these medications.  Continue Tylenol only for headache.  Patient eating better during the hospital course. 3.  Acute kidney injury on chronic kidney disease stage IIIa.  The patient creatinine peaked at 1.65.  Upon discharge down to 1.22.  The patient was given IV fluid hydration during the entire hospital course.  Holding lisinopril HCT. 4.  Essential hypertension.  Initially the patient's blood pressure was on the lower side and we held her medications.  Then blood pressure starting creeping up and we restarted the Norvasc and the Toprol.  Since she has issues with her blood pressure spiking in the evening we switch the Norvasc to evening dosing and will keep the Toprol during the morning.  She  thinks she had a problem with the lisinopril HCT.  We will hold this medication for now.  Can consider starting an ARB as outpatient without the hydrochlorothiazide if blood pressure goes up as outpatient.  Would also try to avoid to lower blood pressure to keep good perfusion up to the brain.  Serum metanephrines sent off but still pending and 24-hour urine for metanephrines sent off but still pending 5.  Type 2 diabetes mellitus with hyperlipidemia.  Continue atorvastatin.  During the hospital course I did give low-dose Lantus and sliding scale while here until kidney function improved.  As outpatient can go back on glipizide. 6.  Anxiety.  Patient can increase her Xanax to twice daily or 3 times daily dosing.  She states he would only take a half a tablet in the morning if she needs it and a full tablet at night.  She is not interested in starting daily medication to prevent anxiety at this time. 7.  History of GERD switched her PPI to Protonix 8.  We also talked about medications for possible diabetic gastroparesis but will hold off at this point since the patient has multiple drug sensitivities. 9.  Weakness physical therapy recommended home health.  Patient did not want home health at this time 10.  Recommend frequent appointments as outpatient with primary care physician. 11.  History of prior strokes with chronic right-sided weakness  SECONDARY DIAGNOSIS:   Past Medical History:  Diagnosis Date  . Anxiety   . Asthma   . CVA (cerebral vascular accident) (HCC)  03/2006   right occipital, Dr. Thad Rangereynolds  . Diabetes mellitus   . GERD (gastroesophageal reflux disease)   . Hyperlipidemia   . Hypertension     DISCHARGE CONDITIONS:   Fair  CONSULTS OBTAINED:  Treatment Team:  Kym [facility], Neuro1, MD  DRUG ALLERGIES:   Allergies  Allergen Reactions  . Citalopram Other (See Comments)    Feels odd  . Doxycycline Hyclate Nausea And Vomiting  . Erythromycin Other (See Comments)     Irritated stomach  . Montelukast Sodium Itching  . Nitrofurantoin Nausea And Vomiting  . Sulfa Antibiotics Nausea And Vomiting  . Tramadol Hcl Other (See Comments)    Feels odd  . Venlafaxine Hcl Other (See Comments)    Feels odd  . Amoxicillin-Pot Clavulanate Nausea And Vomiting  . Clarithromycin Rash     See ER note 02/2017    DISCHARGE MEDICATIONS:   Allergies as of 08/28/2019      Reactions   Citalopram Other (See Comments)   Feels odd   Doxycycline Hyclate Nausea And Vomiting   Erythromycin Other (See Comments)   Irritated stomach   Montelukast Sodium Itching   Nitrofurantoin Nausea And Vomiting   Sulfa Antibiotics Nausea And Vomiting   Tramadol Hcl Other (See Comments)   Feels odd   Venlafaxine Hcl Other (See Comments)   Feels odd   Amoxicillin-pot Clavulanate Nausea And Vomiting   Clarithromycin Rash    See ER note 02/2017      Medication List    STOP taking these medications   lansoprazole 30 MG capsule Commonly known as: PREVACID   lisinopril-hydrochlorothiazide 20-25 MG tablet Commonly known as: ZESTORETIC     TAKE these medications   acetaminophen 325 MG tablet Commonly known as: TYLENOL Take 650 mg by mouth every 6 (six) hours as needed.   ALPRAZolam 0.25 MG tablet Commonly known as: XANAX Take 1 tablet (0.25 mg total) by mouth 3 (three) times daily as needed for anxiety or sleep. What changed:   when to take this  reasons to take this   amLODipine 10 MG tablet Commonly known as: NORVASC Take 1 tablet (10 mg total) by mouth every evening. What changed: when to take this   aspirin EC 81 MG tablet Take 81 mg by mouth every evening.   atorvastatin 80 MG tablet Commonly known as: Lipitor Take 1 tablet (80 mg total) by mouth every evening.   clopidogrel 75 MG tablet Commonly known as: PLAVIX Take 1 tablet (75 mg total) by mouth daily. Start taking on: August 29, 2019   glipiZIDE 5 MG 24 hr tablet Commonly known as: GLUCOTROL XL TAKE 1  TABLET BY MOUTH EVERY DAY WITH BREAKFAST   MELATONIN GUMMIES PO Take 1 tablet by mouth at bedtime as needed.   metoprolol succinate 25 MG 24 hr tablet Commonly known as: TOPROL-XL Take 1 tablet (25 mg total) by mouth daily.   pantoprazole 40 MG tablet Commonly known as: PROTONIX Take 1 tablet (40 mg total) by mouth 2 (two) times daily.        DISCHARGE INSTRUCTIONS:   Follow-up PMD 5 days  If you experience worsening of your admission symptoms, develop shortness of breath, life threatening emergency, suicidal or homicidal thoughts you must seek medical attention immediately by calling 911 or calling your MD immediately  if symptoms less severe.  You Must read complete instructions/literature along with all the possible adverse reactions/side effects for all the Medicines you take and that have been prescribed to you. Take any new  Medicines after you have completely understood and accept all the possible adverse reactions/side effects.   Please note  You were cared for by a hospitalist during your hospital stay. If you have any questions about your discharge medications or the care you received while you were in the hospital after you are discharged, you can call the unit and asked to speak with the hospitalist on call if the hospitalist that took care of you is not available. Once you are discharged, your primary care physician will handle any further medical issues. Please note that NO REFILLS for any discharge medications will be authorized once you are discharged, as it is imperative that you return to your primary care physician (or establish a relationship with a primary care physician if you do not have one) for your aftercare needs so that they can reassess your need for medications and monitor your lab values.    Today   CHIEF COMPLAINT:   Chief Complaint  Patient presents with  . Altered Mental Status    HISTORY OF PRESENT ILLNESS:  Tami Jones  is a 64 y.o. female  came in with acute metabolic encephalopathy and total body weakness   VITAL SIGNS:  Blood pressure 136/66, pulse (!) 58, temperature 98.3 F (36.8 C), resp. rate 18, height 5\' 4"  (1.626 m), weight 110.2 kg, SpO2 100 %.  I/O:    Intake/Output Summary (Last 24 hours) at 08/28/2019 1614 Last data filed at 08/28/2019 0950 Gross per 24 hour  Intake 720 ml  Output 1550 ml  Net -830 ml    PHYSICAL EXAMINATION:  GENERAL:  64 y.o.-year-old patient lying in the bed with no acute distress.  EYES: Pupils equal, round, reactive to light and accommodation. No scleral icterus. Extraocular muscles intact.  HEENT: Head atraumatic, normocephalic. Oropharynx and nasopharynx clear.   LUNGS: Normal breath sounds bilaterally, no wheezing, rales,rhonchi or crepitation. No use of accessory muscles of respiration.  CARDIOVASCULAR: S1, S2 normal. No murmurs, rubs, or gallops.  ABDOMEN: Soft, non-tender, non-distended.  EXTREMITIES: No pedal edema.  NEUROLOGIC: Cranial nerves II through XII are intact.  Baseline right leg weakness.  PSYCHIATRIC: The patient is alert and oriented x 3.  SKIN: No obvious rash, lesion, or ulcer.   DATA REVIEW:   CBC Recent Labs  Lab 08/25/19 0238  WBC 7.2  HGB 14.7  HCT 43.1  PLT 245    Chemistries  Recent Labs  Lab 08/25/19 0238 08/26/19 0456 08/28/19 0736  NA 135   < > 141  K 4.0   < > 4.1  CL 97*   < > 106  CO2 25   < > 27  GLUCOSE 235*   < > 130*  BUN 23   < > 28*  CREATININE 1.33*   < > 1.22*  CALCIUM 9.5   < > 8.4*  AST 24  --   --   ALT 29  --   --   ALKPHOS 88  --   --   BILITOT 0.7  --   --    < > = values in this interval not displayed.     Microbiology Results  Results for orders placed or performed during the hospital encounter of 08/25/19  SARS Coronavirus 2 by RT PCR (hospital order, performed in [facility] hospital lab) Nasopharyngeal     Status: None   Collection Time: 08/25/19  4:05 AM   Specimen: Nasopharyngeal  Result Value Ref  Range Status   SARS Coronavirus 2 NEGATIVE NEGATIVE  Final    Comment: (NOTE) SARS-CoV-2 target nucleic acids are NOT DETECTED.  The SARS-CoV-2 RNA is generally detectable in upper and lower respiratory specimens during the acute phase of infection. The lowest concentration of SARS-CoV-2 viral copies this assay can detect is 250 copies / mL. A negative result does not preclude SARS-CoV-2 infection and should not be used as the sole basis for treatment or other patient management decisions.  A negative result may occur with improper specimen collection / handling, submission of specimen other than nasopharyngeal swab, presence of viral mutation(s) within the areas targeted by this assay, and inadequate number of viral copies (<250 copies / mL). A negative result must be combined with clinical observations, patient history, and epidemiological information.  Fact Sheet for Patients:   BoilerBrush.com.cy  Fact Sheet for Healthcare Providers: https://pope.com/  This test is not yet approved or  cleared by the [place] FDA and has been authorized for detection and/or diagnosis of SARS-CoV-2 by FDA under an Emergency Use Authorization (EUA).  This EUA will remain in effect (meaning this test can be used) for the duration of the COVID-19 declaration under Section 564(b)(1) of the Act, 21 U.S.C. section 360bbb-3(b)(1), unless the authorization is terminated or revoked sooner.  Performed at [facility], [address] [place], [place] [address]     RADIOLOGY:  EEG  Result Date: 08/27/2019 Thana Farr, MD     08/27/2019  7:12 PM ELECTROENCEPHALOGRAM REPORT Patient: Tami Jones       Room #: 240A-AA EEG No. ID: [id number] Age: 64 y.o.        Sex: female Requesting Physician: Renae Gloss Report Date:  08/27/2019       Interpreting Physician: Thana Farr History: Tami Jones is an 64 y.o. female with an episode of altered  mental status Medications: ASA, Norvasc, Lipitor, Plavix, Insulin, Toprol Conditions of Recording:  This is a 21 channel routine scalp EEG performed with bipolar and monopolar montages arranged in accordance to the international 10/20 system of electrode placement. One channel was dedicated to EKG recording. The patient is in the awake state. Description:  The waking background activity consists of a low voltage, symmetrical, fairly well organized, 9 Hz alpha activity, seen from the parieto-occipital and posterior temporal regions.  Low voltage fast activity, poorly organized, is seen anteriorly and is at times superimposed on more posterior regions.  A mixture of theta and alpha rhythms are seen from the central and temporal regions. The patient does not drowse or sleep. No epileptiform activity is noted.  Hyperventilation was not performed.  Intermittent photic stimulation was performed but failed to illicit any change in the tracing. IMPRESSION: Normal electroencephalogram, awake and with activation procedures. There are no focal lateralizing or epileptiform features. Thana Farr, MD Neurology [phone] 08/27/2019, 7:09 PM   CT CEREBRAL PERFUSION W CONTRAST  Result Date: 08/27/2019 CLINICAL DATA:  Weakness, altered speech, negative MRI, CTA with stenoses EXAM: CT PERFUSION BRAIN TECHNIQUE: Multiphase CT imaging of the brain was performed following IV bolus contrast injection. Subsequent parametric perfusion maps were calculated using RAPID software. CONTRAST:  38mL OMNIPAQUE IOHEXOL 350 MG/ML SOLN COMPARISON:  None. FINDINGS: CT Brain Perfusion Findings: CBF (<30%) Volume: 42mL Perfusion (Tmax>6.0s) volume: 8mL Mismatch Volume: 59mL Infarct Core: 0 mL Infarction Location:None IMPRESSION: No evidence of territory at risk. Electronically Signed   By: Guadlupe Spanish M.D.   On: 08/27/2019 14:20    Management plans discussed with the patient, and she is in agreement.  She stated  she will call family.  I did  speak with the son yesterday about likely discharge today.  CODE STATUS:     Code Status Orders  (From admission, onward)         Start     Ordered   08/25/19 0423  Full code  Continuous        08/25/19 0426        Code Status History    Date Active Date Inactive Code Status Order ID Comments User Context   08/01/2019 0809 08/02/2019 1904 Full Code [id number]  Lorretta Harp, MD ED   06/09/2019 1730 06/10/2019 2131 Full Code [id number]  Lorretta Harp, MD Inpatient   08/04/2018 0216 08/05/2018 1532 Full Code [id number]  Pearletha Alfred, NP Inpatient   07/10/2014 0923 07/11/2014 1845 Full Code [id number]  Arnaldo Natal, MD ED   Advance Care Planning Activity      TOTAL TIME TAKING CARE OF THIS PATIENT: 35 minutes.    Alford Highland M.D on 08/28/2019 at 4:14 PM  Between 7am to 6pm - Pager - [phone]  After 6pm go to [email or web address] - password [email or web address]  [facility]  CC: Primary care physician; Karie Schwalbe, MD

## 2019-08-28 NOTE — Evaluation (Signed)
Physical Therapy Evaluation Patient Details Name: Tami Jones MRN: [id number] DOB: 1955-06-16 Today's Date: 08/28/2019   History of Present Illness   Tami Jones is a 64 y.o. 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  Patient had been seen earlier in the emergency room with a complaint of uncontrolled blood pressure and related stress.  She was treated and discharged only to return several hours later with the episode described above.  Clinical Impression  Pt received lying in bed upon arrival to room and agreeable to PT evaluation. Pt perseverated on BP throughout entire session and required distraction to perform activities. Pt performed bed mobility with supervision and sit <> stand transfer with min A. Reports dizziness once seated edge of bed and BP taken. Pt falling to the L with EOB sitting requiring min A for steadying and safety. Pt overall presents with decreased strength  With R residual weakness from previous CVAs and requires min A for sit <> stand transfers at this time. Pt amblulated 3 feet using RW with min A for steadying. Dizziness was limiting factor therefore ambulation distance limited. Pt performed therex for BLE strengthening and joint maintenance. Recommend HHPT at discharge to address current deficits and optimize return to PLOF.  Orthostatic BP LUE: Supine:117/79 Sitting: 168/77 Standing: 131/111 Post sitting: 158/76    Follow Up Recommendations Home health PT    Equipment Recommendations  None  recommended by PT    Recommendations for Other Services       Precautions / Restrictions Precautions Precautions: Fall Restrictions Weight Bearing Restrictions: No      Mobility  Bed Mobility Overal bed mobility: Needs Assistance Bed Mobility: Supine to Sit;Sit to Supine     Supine to sit: Supervision Sit to supine: Supervision   General bed mobility comments: Pt required no physical assistance to come from supine <> sit with HOB slightly elevated.   Transfers Overall transfer level: Needs assistance Equipment used: Rolling walker (2 wheeled) Transfers: Sit to/from Stand Sit to Stand: Min assist         General transfer comment: Min A for sit <> stand transfer for boost into standing and steadying. Verbal cues required for hand placement to improve safety.  Ambulation/Gait Ambulation/Gait assistance: Min assist Gait Distance (Feet): 3 Feet Assistive device: Rolling walker (2 wheeled) Gait Pattern/deviations: Step-through pattern;Decreased step length - right;Decreased step length - left Gait velocity: decreased   General Gait Details: Pt ambulated from bed to recliner chair using RW and min A for steadying. Noted reciprocal pattern and decreased step lengths bilaterally. Pt with increased reports of dizziness while in standing therefore ambulation distance limited.  Stairs            Wheelchair Mobility    Modified Rankin (Stroke Patients Only)       Balance Overall balance assessment: Needs assistance Sitting-balance support: Feet supported;Single extremity supported Sitting balance-Leahy Scale: Fair Sitting balance - Comments: Pt with L lateral lean in sitting which pt answers that she is aware of. She reported feeling dizzy and required min A for balance and safety.  Standing balance support: Bilateral upper extremity supported Standing balance-Leahy Scale: Fair Standing balance comment: Min A for static and dynamic standing balance activities for  steadying and safety. Pt with slight multidirectional sway noted when upright.                             Pertinent Vitals/Pain Pain Assessment: No/denies pain    Home Living Family/patient expects to be discharged to:: Private residence Living Arrangements: Children Available Help at Discharge: Family;Available 24 hours/day Type of Home: House Home Access: Stairs to enter Entrance Stairs-Rails: Left Entrance Stairs-Number of Steps: 3 Home Layout: One level Home Equipment: Walker - 2 wheels;Cane - quad;Bedside commode;Grab bars - tub/shower;Shower seat Additional Comments: Daughter, daughter's husband, and two young grandchildren live with the pt. Daughter is available 24/7    Prior Function Level of Independence: Needs assistance   Gait / Transfers Assistance Needed: Pt reports that she amb with QC at baseline; reports walking daily to the mailbox and limited community ambulation (states ~1x/mo she leaves the home for short period of time and relies on "buggy" to get around. Pt declines need for assist for transfers or bed mobility.  ADL's / Homemaking Assistance Needed: Pt states that she does not need assist with ADLs or household chores.        Hand Dominance   Dominant Hand: Right    Extremity/Trunk Assessment   Upper Extremity Assessment Upper Extremity Assessment: Generalized weakness (RUE 3/5 grossly, LUE 3+ to 4-/5 grossly) RUE Deficits / Details: Shoulder limited AROM ~80 degrees.     Lower Extremity Assessment Lower Extremity Assessment: Generalized weakness (grossly 3 to 3+/5 for RLE; LLE 3+ to 4-/5)       Communication   Communication: No difficulties  Cognition Arousal/Alertness: Awake/alert Behavior During Therapy: WFL for tasks assessed/performed Overall Cognitive Status: Within Functional Limits for tasks assessed                                 General Comments: Pt A&O x 4.      General Comments      Exercises Other  Exercises Other Exercises: Pt perfomred seated alternating marching, hip ab/add, and LAQ x 10 bilat. Verbal cues for correct technique and achieving full range of movements.   Assessment/Plan    PT Assessment Patient needs continued PT services  PT Problem List Decreased strength;Decreased range of motion;Decreased activity tolerance;Decreased balance;Decreased mobility;Decreased safety awareness       PT Treatment Interventions DME instruction;Gait training;Stair training;Functional mobility training;Therapeutic activities;Therapeutic exercise;Balance training;Neuromuscular re-education;Patient/family education    PT Goals (Current goals can be found in the Care Plan section)  Acute Rehab PT Goals Patient Stated Goal: to get BP under control PT Goal Formulation: With patient Time For Goal Achievement: 09/11/19 Potential to Achieve Goals: Good    Frequency Min 2X/week   Barriers to discharge        Co-evaluation               AM-PAC PT "6 Clicks" Mobility  Outcome Measure Help needed turning from your back to your side while in a flat bed without using bedrails?: None Help needed moving from lying on your back to sitting on the side of a flat bed without using bedrails?: A Little Help needed moving to and from a bed to a chair (including a wheelchair)?: A Little Help needed standing up from a  chair using your arms (e.g., wheelchair or bedside chair)?: A Little Help needed to walk in hospital room?: A Little Help needed climbing 3-5 steps with a railing? : A Little 6 Click Score: 19    End of Session Equipment Utilized During Treatment: Gait belt Activity Tolerance: Other (comment) (limited by dizziness) Patient left: in chair;with call bell/phone within reach;with chair alarm set Nurse Communication: Mobility status;Other (comment) (orthostatic BP values) PT Visit Diagnosis: Unsteadiness on feet (R26.81);Muscle weakness (generalized) (M62.81);Dizziness and giddiness  (R42)    Time: 0254-2706 PT Time Calculation (min) (ACUTE ONLY): 39 min   Charges:             Frederich Chick, SPT  Frederich Chick 08/28/2019, 11:53 AM

## 2019-08-28 NOTE — Progress Notes (Signed)
Discharge instructions reviewed with patient. Opportunity for questions made available. Patient verbalized understanding of d/c instructions. IV & telemetry removed per protocol. Daughter to transport pt home.

## 2019-08-28 NOTE — TOC Initial Note (Signed)
Transition of Care [facility]) - Initial/Assessment Note    Patient Details  Name: Tami Jones MRN: [id number] Date of Birth: Apr 17, 1955  [facility] Care [facility]) CM/SW Contact:    Trenton Founds, RN Phone Number: 08/28/2019, 1:12 PM  Clinical Narrative:    RNCM spoke with patient by telephone after assessing at bedside day prior. Patient reports that she had recently received in home therapy services and doesn't think at this time that she needs them again. Patient reports that she has all necessary equipment in the home and does not think there is anything she needs at this time.              Expected Discharge Plan: Home/Self Care Barriers to Discharge: No Barriers Identified   Patient Goals and CMS Choice        Expected Discharge Plan and Services Expected Discharge Plan: Home/Self Care       Living arrangements for the past 2 months: Single Family Home Expected Discharge Date: 08/28/19                                    Prior Living Arrangements/Services Living arrangements for the past 2 months: Single Family Home Lives with:: Self                   Activities of Daily Living Home Assistive Devices/Equipment: Cane (specify quad or straight) (4 and 5 prong) ADL Screening (condition at time of admission) Patient's cognitive ability adequate to safely complete daily activities?: Yes Is the patient deaf or have difficulty hearing?: No Does the patient have difficulty seeing, even when wearing glasses/contacts?: No Does the patient have difficulty concentrating, remembering, or making decisions?: No Patient able to express need for assistance with ADLs?: Yes Does the patient have difficulty dressing or bathing?: Yes Independently performs ADLs?: No Communication: Independent Does the patient have difficulty walking or climbing stairs?: Yes Weakness of Legs: Both Weakness of Arms/Hands: Both  Permission Sought/Granted                  Emotional  Assessment   Attitude/Demeanor/Rapport: Engaged Affect (typically observed): Appropriate Orientation: : Oriented to Self, Oriented to Place, Oriented to  Time, Oriented to Situation Alcohol / Substance Use: Not Applicable Psych Involvement: No (comment)  Admission diagnosis:  Encephalopathy [G93.40] Acute focal neurologic deficit with partial resolution [R29.818] Nausea & vomiting [R11.2] Patient Active Problem List   Diagnosis Date Noted  . Nausea & vomiting 08/26/2019  . History of CVA with residual deficit 08/25/2019  . Acute metabolic encephalopathy   . Intractable headache   . Nausea   . Anxiety   . Hypertensive emergency, no CHF 08/01/2019  . Elevated troponin 08/01/2019  . Hypertension   . HLD (hyperlipidemia)   . Type 2 diabetes mellitus with hyperlipidemia (HCC)   . CKD (chronic kidney disease), stage IIIa   . Acute CVA (cerebrovascular accident) (HCC)   . Depression   . Gross hematuria 01/15/2019  . Dysuria 01/15/2019  . Right shoulder pain 12/04/2018  . Neuropathic pain, arm 10/26/2018  . MDD (major depressive disorder), single episode, moderate (HCC) 09/21/2018  . Hemiparesis of right dominant side (HCC) 08/17/2018  . SOB (shortness of breath) 03/08/2017  . Vestibular dizziness 01/21/2016  . Weakness 09/22/2015  . Tick bite 07/15/2015  . Acute kidney injury superimposed on CKD (HCC) 07/17/2014  . Cerebrovascular disease 07/17/2014  . Preventative health care 06/25/2014  .  Right sided sciatica 03/08/2013  . Chest pain 01/24/2012  . MENOPAUSAL SYNDROME 09/10/2008  . Mood disorder (HCC) 09/15/2007  . Essential hypertension, benign 08/10/2006  . Type 2 diabetes mellitus with other circulatory complications (HCC) 06/17/2006  . HYPERCHOLESTEROLEMIA 06/17/2006  . GERD 06/17/2006   PCP:  Karie Schwalbe, MD Pharmacy:   [id number] - [place], [place] - [address] AT [facility] OF [address]  [place] [address] Phone: [phone] Fax: [phone]  [id number] [place], [place] - [address] AT [facility] OF [address] [place] [address] Phone: [phone] Fax: [phone]     Social Determinants of Health (SDOH) Interventions    Readmission Risk Interventions No flowsheet data found.

## 2019-08-29 ENCOUNTER — Telehealth: Payer: Self-pay | Admitting: *Deleted

## 2019-08-29 NOTE — Telephone Encounter (Signed)
1st attempt. Unable to reach patient.   

## 2019-08-30 LAB — METANEPHRINES, URINE, 24 HOUR
Metaneph Total, Ur: 20 ug/L
Metanephrines, 24H Ur: 42 ug/24 hr (ref 36–209)
Normetanephrine, 24H Ur: 158 ug/24 hr (ref 131–612)
Normetanephrine, Ur: 75 ug/L
Total Volume: 2100

## 2019-08-30 LAB — METANEPHRINES, PLASMA
Metanephrine, Free: <10 pg/mL (ref 0.0–88.0)
Normetanephrine, Free: 20.2 pg/mL (ref 0.0–191.8)

## 2019-08-30 NOTE — Telephone Encounter (Signed)
Okay I will see her as planned tomorrow

## 2019-08-30 NOTE — Telephone Encounter (Signed)
I have made two attempts and have been unable to reach patient. Pt has hospital follow up scheduled w/ PCP 08/31/19.

## 2019-08-31 ENCOUNTER — Ambulatory Visit (INDEPENDENT_AMBULATORY_CARE_PROVIDER_SITE_OTHER): Payer: 59 | Admitting: Internal Medicine

## 2019-08-31 ENCOUNTER — Other Ambulatory Visit: Payer: Self-pay

## 2019-08-31 ENCOUNTER — Encounter: Payer: Self-pay | Admitting: Internal Medicine

## 2019-08-31 DIAGNOSIS — I10 Essential (primary) hypertension: Secondary | ICD-10-CM

## 2019-08-31 DIAGNOSIS — F39 Unspecified mood [affective] disorder: Secondary | ICD-10-CM | POA: Diagnosis not present

## 2019-08-31 DIAGNOSIS — G9341 Metabolic encephalopathy: Secondary | ICD-10-CM | POA: Diagnosis not present

## 2019-08-31 NOTE — Assessment & Plan Note (Signed)
BP Readings from Last 3 Encounters:  08/31/19 130/78  08/28/19 136/66  08/24/19 126/80   Good even off the lisinopril/HCTZ Continue amlodipine PM and metoprolol AM

## 2019-08-31 NOTE — Assessment & Plan Note (Signed)
Chronic anxiety and depression  Not really MDD--and failed multiple meds Will continue xanax at bedtime and prn----urged her to take this for any spells

## 2019-08-31 NOTE — Assessment & Plan Note (Signed)
Mental status changes and weakness were temporary May have been transient hypotension but I think somatization is a definite possibility Reviewed work up--no clear answers

## 2019-08-31 NOTE — Progress Notes (Signed)
Subjective:    Patient ID: Tami Jones, female    DOB: 1955-05-11, 64 y.o.   MRN: [id number]  HPI Here for hospital follow up This visit occurred during the SARS-CoV-2 public health emergency.  Safety protocols were in place, including screening questions prior to the visit, additional usage of staff PPE, and extensive cleaning of exam room while observing appropriate contact time as indicated for disinfecting solutions.   Has episodes that her BP "just spikes" Felt bad all last week---asked to be brought to the hospital Will not feel good/BP goes up/anxiety Some nausea as well Given meds and sent home Then awoke and didn't feel right----passed out. 911 "I was lifeless, I couldn't do anything" Doesn't remember much of this, etc Hard to talk or move her limbs Awake but couldn't talk in EMS  This gradually improved  Reviewed hospital evaluation and discharge summary MRI just showed prior strokes Initially BP low--meds held. Then amlodipine (evening) and metoprolol in AM Off the lisinopril/HCTZ  Didn't tolerate multiple meds for headache Used xanax for sleep and anxiety  AKI improved with fluids Now changed to protonix  Current Outpatient Medications on File Prior to Visit  Medication Sig Dispense Refill  . acetaminophen (TYLENOL) 325 MG tablet Take 650 mg by mouth every 6 (six) hours as needed.    . ALPRAZolam (XANAX) 0.25 MG tablet Take 1 tablet (0.25 mg total) by mouth 3 (three) times daily as needed for anxiety or sleep. 30 tablet 0  . amLODipine (NORVASC) 10 MG tablet Take 1 tablet (10 mg total) by mouth every evening. 90 tablet 3  . aspirin EC 81 MG tablet Take 81 mg by mouth every evening.     Marland Kitchen atorvastatin (LIPITOR) 80 MG tablet Take 1 tablet (80 mg total) by mouth every evening.    . clopidogrel (PLAVIX) 75 MG tablet Take 1 tablet (75 mg total) by mouth daily. 30 tablet 0  . glipiZIDE (GLUCOTROL XL) 5 MG 24 hr tablet TAKE 1 TABLET BY MOUTH EVERY DAY WITH BREAKFAST  90 tablet 0  . MELATONIN GUMMIES PO Take 1 tablet by mouth at bedtime as needed.    . metoprolol succinate (TOPROL-XL) 25 MG 24 hr tablet Take 1 tablet (25 mg total) by mouth daily. 90 tablet 3  . pantoprazole (PROTONIX) 40 MG tablet Take 1 tablet (40 mg total) by mouth 2 (two) times daily. 60 tablet 0   No current facility-administered medications on file prior to visit.    Allergies  Allergen Reactions  . Citalopram Other (See Comments)    Feels odd  . Doxycycline Hyclate Nausea And Vomiting  . Erythromycin Other (See Comments)    Irritated stomach  . Montelukast Sodium Itching  . Nitrofurantoin Nausea And Vomiting  . Sulfa Antibiotics Nausea And Vomiting  . Tramadol Hcl Other (See Comments)    Feels odd  . Venlafaxine Hcl Other (See Comments)    Feels odd  . Amoxicillin-Pot Clavulanate Nausea And Vomiting  . Clarithromycin Rash     See ER note 02/2017    Past Medical History:  Diagnosis Date  . Anxiety   . Asthma   . 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 Thad Ranger  . Diabetes mellitus   . GERD (gastroesophageal reflux disease)   . Hyperlipidemia   . Hypertension     Past Surgical History:  Procedure Laterality Date  . CARDIOVASCULAR STRESS TEST  1/15   myoview. EF 60%  . CESAREAN SECTION    .  ESOPHAGOGASTRODUODENOSCOPY  05/2005  . TONSILLECTOMY AND ADENOIDECTOMY      Family History  Problem Relation Age of Onset  . Coronary artery disease Mother   . Diabetes Mellitus II Mother   . Heart attack Mother 66  . Diabetes Mellitus II Maternal Grandmother     Social History   Socioeconomic History  . Marital status: Divorced    Spouse name: Not on file  . Number of children: 2  . Years of education: Not on file  . Highest education level: Not on file  Occupational History  . Occupation: LAB TECH    Employer: [employer]  Tobacco Use  . Smoking status: Never Smoker  . Smokeless tobacco: Never Used  Vaping Use  . Vaping Use:  Never used  Substance and Sexual Activity  . Alcohol use: No    Alcohol/week: 0.0 standard drinks    Comment: wine occassionally  . Drug use: No  . Sexual activity: Yes    Birth control/protection: Condom  Other Topics Concern  . Not on file  Social History Narrative  . Not on file   Social Determinants of Health   Financial Resource Strain:   . Difficulty of Paying Living Expenses:   Food Insecurity:   . Worried About Programme researcher, broadcasting/film/video in the Last Year:   . Barista in the Last Year:   Transportation Needs:   . Freight forwarder (Medical):   Marland Kitchen Lack of Transportation (Non-Medical):   Physical Activity:   . Days of Exercise per Week:   . Minutes of Exercise per Session:   Stress:   . Feeling of Stress :   Social Connections:   . Frequency of Communication with Friends and Family:   . Frequency of Social Gatherings with Friends and Family:   . Attends Religious Services:   . Active Member of Clubs or Organizations:   . Attends Banker Meetings:   Marland Kitchen Marital Status:   Intimate Partner Violence:   . Fear of Current or Ex-Partner:   . Emotionally Abused:   Marland Kitchen Physically Abused:   . Sexually Abused:    Review of Systems Ongoing occipital headaches--around to front Back to walking with walker due to weakness Sleeping okay with the xanax Appetite is not very good     Objective:   Physical Exam Constitutional:      Appearance: Normal appearance.  Cardiovascular:     Rate and Rhythm: Normal rate and regular rhythm.     Heart sounds: No murmur heard.  No gallop.   Pulmonary:     Effort: Pulmonary effort is normal.     Breath sounds: Normal breath sounds. No wheezing or rales.  Musculoskeletal:     Right lower leg: No edema.     Left lower leg: No edema.  Neurological:     Mental Status: She is alert.  Psychiatric:     Comments: Clearly some anxiety Mild depression            Assessment & Plan:

## 2019-09-03 ENCOUNTER — Telehealth: Payer: Self-pay

## 2019-09-03 NOTE — Telephone Encounter (Signed)
Tried home number and work number and no answer and no VM.

## 2019-09-03 NOTE — Telephone Encounter (Signed)
Tried to contact pt but no answer and no VM.  Will try again later.

## 2019-09-03 NOTE — Telephone Encounter (Signed)
[facility] Night - Client TELEPHONE ADVICE RECORD Access[employer]urse Patient Name: Tami Jones Gender: Female DOB: 1955/08/04 Age: 64 Y 8 M 12 D Return Phone Number: [phone] (Primary) Address: [address] City/State/Zip: [place] [address] Client [facility] Night - Client Client Site [facility] [place] - Night Physician Tillman Abide- MD Contact Type Call Who Is Calling Patient / Member / Family / Caregiver Call Type Triage / Clinical Relationship To Patient Self Return Phone Number [phone] (Primary) Chief Complaint Diarrhea Reason for Call Symptomatic / Request for Health Information Initial Comment Has been having diarrhea and her stool is white looking. Is taking med. as directed 2x daily, wants to know if she should be taking it 1x daily. Translation No Nurse Assessment Nurse: Jetty Peeks, RN, Lillia Abed Date/Time (Eastern Time): 09/01/2019 10:22:39 AM Confirm and document reason for call. If symptomatic, describe symptoms. ---Caller states she has diarrhea that is white. Also has a medication questions. States no fever. Was d/c'd from the hospital on Tuesday after being seen for HTN and dehydration. Has the patient had close contact with a person known or suspected to have the novel coronavirus illness OR traveled / lives in area with major community spread (including international travel) in the last 14 days from the onset of symptoms? * If Asymptomatic, screen for exposure and travel within the last 14 days. ---No Does the patient have any new or worsening symptoms? ---Yes Will a triage be completed? ---Yes Related visit to physician within the last 2 weeks? ---Yes Does the PT have any chronic conditions? (i.e. diabetes, asthma, this includes High risk factors for pregnancy, etc.) ---Yes List chronic conditions. ---HTN, diabetes- PO Is this a behavioral health or substance abuse call?  ---No Guidelines Guideline Title Affirmed Question Affirmed Notes Nurse Date/Time (Eastern Time) Diarrhea [1] Recent hospitalization AND [2] diarrhea present > 3 days Weiss-Hilton, RN, Lillia Abed 09/01/2019 10:24:37 AM Blood Pressure - High [1] Systolic BP >= 130 OR Diastolic >= 80 AND [2] taking BP medications Weiss-Hilton, RN, Lillia Abed 09/01/2019 10:33:35 AMPLEASE NOTE: All timestamps contained within this report are represented as [place] Standard Time. CONFIDENTIALTY NOTICE: This fax transmission is intended only for the addressee. It contains information that is legally privileged, confidential or otherwise protected from use or disclosure. If you are not the intended recipient, you are strictly prohibited from reviewing, disclosing, copying using or disseminating any of this information or taking any action in reliance on or regarding this information. If you have received this fax in error, please notify us immediately by telephone so that we can arrange for its return to [place]. Phone: [phone], Toll-Free: [phone], Fax: [phone] Page: 2 of 2 Call Id: [id number] Disp. Time Lamount Cohen Time) Disposition Final User 09/01/2019 10:35:35 AM See PCP within 2 Weeks Weiss-Hilton, RN, Lillia Abed 09/01/2019 10:30:22 AM See PCP within 24 Hours Yes Weiss-Hilton, RN, Augustina Mood Disagree/Comply Comply Caller Understands Yes PreDisposition InappropriateToAsk Care Advice Given Per Guideline SEE PCP WITHIN 24 HOURS: REASSURANCE AND EDUCATION - RECENT HOSPITALIZATION: * If you have been in the hospital recently, your diarrhea might be caused by a certain type of bacteria called C. Diff. * Your doctor may want to test your stool to check for this bacteria. FLUID THERAPY DURING MILD TO MODERATE DIARRHEA: * Drink more fluids, at least 8-10 cups daily. One cup equals 8 oz (240 ml). FOOD AND NUTRITION DURING MILD TO MODERATE DIARRHEA: * Maintaining some food intake during episodes of diarrhea is  important. *  Begin with boiled starches / cereals (e.g., potatoes, rice, noodles, wheat, oats) with a small amount of salt to taste. * Other foods that are OK include: bananas, yogurt, crackers, soup. CALL BACK IF: * Signs of dehydration occur (e.g., no urine over 12 hours, very dry mouth, lightheaded, etc.) * Bloody stools * Constant or severe abdominal pain * You become worse. CARE ADVICE given per Diarrhea (Adult) guideline. SEE PCP WITHIN 2 WEEKS: * You need to be seen for this ongoing problem within the next 2 weeks. REASSURANCE AND EDUCATION: * Your blood pressure is elevated but you have told me that you are not having any symptoms. LIFESTYLE MODIFICATIONS: * EAT HEALTHY: Eat a diet rich in fresh fruits and vegetables, dietary fiber, non-animal protein (e.g., soy), and low-fat dairy products. Avoid foods with a high content of saturated fat or cholesterol. * DECREASE SODIUM INTAKE: Aim to eat less than 2.4 g (100 mmol) of sodium each day. Unfortunately 75% of the salt in the average person's diet is in preprocessed foods. * REDUCE STRESS: Find activities that help reduce your stress. Examples might include meditation, yoga, or even a restful walk in a park. CALL BACK IF: * Weakness or numbness of the face, arm or leg on one side of the body occurs * Difficulty walking, difficulty talking, or severe headache occurs * Chest pain or difficulty breathing occurs * Your blood pressure is over 160/100 * You become worse. CARE ADVICE given per High Blood Pressure (Adult) guideline. Comments User: Jovita Kussmaul, RN Date/Time Lamount Cohen Time): 09/01/2019 10:31:42 AM States she woke up last night and had BP of 201/100. User: Jovita Kussmaul, RN Date/Time Lamount Cohen Time): 09/01/2019 10:33:25 AM Current BP is 143/85 Referrals [facility]- UC

## 2019-09-04 NOTE — Telephone Encounter (Signed)
Pt reports she is doing better and denies any diarrhea currently. She has decided to take only one protonix daily and she is feeling much better since then. She has figured out that the white stool was caused by the contrast used for an MRI she had. She is doing much better but is worried she is still so weak. She has been making progress but wants to make progress faster. She reports getting out of bed everyday and trying to get something accomplished daily.   Pt also reports that her left arm was still hurting where the 18 gauge IV was. She said the pain has gotten better and will contact the office if the pain worsens or she develops any redness, warmth, lumps, or exudate.  Advised pt she has apt on 8/17 but if anything is needed before apt to contact office. Pt appreciative and verbalized understanding.

## 2019-09-05 NOTE — Telephone Encounter (Signed)
noted 

## 2019-09-08 NOTE — Telephone Encounter (Signed)
Please check on her again on Monday

## 2019-09-12 ENCOUNTER — Ambulatory Visit: Payer: 59 | Admitting: Internal Medicine

## 2019-09-12 NOTE — Telephone Encounter (Signed)
Spoke to pt. Diarrhea is better.  Asking about her metoprolol and amlodipine. Says she doesn't think the amlodipine is working at night. Says he BP is elevated at night at times.

## 2019-09-13 NOTE — Telephone Encounter (Signed)
It may be too soon to judge. Have her write down her BP measurements and bring them in to her appt in August (let her know she still is on the schedule in Epic as having an appt with Dr Larwance Sachs)

## 2019-09-13 NOTE — Telephone Encounter (Signed)
Tried to call pt. VM is full. Could not leave a message.

## 2019-10-02 ENCOUNTER — Ambulatory Visit: Payer: 59 | Admitting: Internal Medicine

## 2019-10-02 DIAGNOSIS — Z0289 Encounter for other administrative examinations: Secondary | ICD-10-CM

## 2019-10-11 ENCOUNTER — Ambulatory Visit: Payer: 59 | Admitting: Internal Medicine

## 2019-10-16 ENCOUNTER — Ambulatory Visit (INDEPENDENT_AMBULATORY_CARE_PROVIDER_SITE_OTHER): Payer: 59 | Admitting: Internal Medicine

## 2019-10-16 ENCOUNTER — Other Ambulatory Visit: Payer: Self-pay

## 2019-10-16 ENCOUNTER — Encounter: Payer: Self-pay | Admitting: Internal Medicine

## 2019-10-16 VITALS — BP 146/78 | HR 86 | Temp 97.9°F | Ht 64.0 in | Wt 240.0 lb

## 2019-10-16 DIAGNOSIS — I10 Essential (primary) hypertension: Secondary | ICD-10-CM | POA: Diagnosis not present

## 2019-10-16 DIAGNOSIS — E1159 Type 2 diabetes mellitus with other circulatory complications: Secondary | ICD-10-CM | POA: Diagnosis not present

## 2019-10-16 LAB — POCT GLYCOSYLATED HEMOGLOBIN (HGB A1C): Hemoglobin A1C: 7.8 % — AB (ref 4.0–5.6)

## 2019-10-16 MED ORDER — ALPRAZOLAM 0.25 MG PO TABS: 0.2500 mg | ORAL_TABLET | Freq: Three times a day (TID) | ORAL | 0 refills | Status: DC | PRN

## 2019-10-16 MED ORDER — LISINOPRIL 5 MG PO TABS: 5.0000 mg | ORAL_TABLET | Freq: Every day | ORAL | 3 refills | Status: DC

## 2019-10-16 NOTE — Progress Notes (Signed)
Subjective:    Patient ID: Tami Jones, female    DOB: 02/17/1955, 64 y.o.   MRN: [id number]  HPI He for follow up of encephalopathy This visit occurred during the SARS-CoV-2 public health emergency.  Safety protocols were in place, including screening questions prior to the visit, additional usage of staff PPE, and extensive cleaning of exam room while observing appropriate contact time as indicated for disinfecting solutions.   Her BP has been better--but reports 150's/90 or so Had episode this morning--had to take xanax at ~4AM Checked sugar--was 241 No low blood pressures at all Notes variability in BP Some vague chest discomfort---also pain in legs and arms at times Breathing is okay  AM fasting sugars usually low---like 112 Others are random--- 140-170 generally  Current Outpatient Medications on File Prior to Visit  Medication Sig Dispense Refill  . acetaminophen (TYLENOL) 325 MG tablet Take 650 mg by mouth every 6 (six) hours as needed.    . ALPRAZolam (XANAX) 0.25 MG tablet Take 1 tablet (0.25 mg total) by mouth 3 (three) times daily as needed for anxiety or sleep. 30 tablet 0  . amLODipine (NORVASC) 10 MG tablet Take 1 tablet (10 mg total) by mouth every evening. 90 tablet 3  . aspirin EC 81 MG tablet Take 81 mg by mouth every evening.     Marland Kitchen atorvastatin (LIPITOR) 80 MG tablet Take 1 tablet (80 mg total) by mouth every evening.    . clopidogrel (PLAVIX) 75 MG tablet Take 1 tablet (75 mg total) by mouth daily. 30 tablet 0  . glipiZIDE (GLUCOTROL XL) 5 MG 24 hr tablet TAKE 1 TABLET BY MOUTH EVERY DAY WITH BREAKFAST 90 tablet 0  . MELATONIN GUMMIES PO Take 1 tablet by mouth at bedtime as needed.    . metoprolol succinate (TOPROL-XL) 25 MG 24 hr tablet Take 1 tablet (25 mg total) by mouth daily. 90 tablet 3  . pantoprazole (PROTONIX) 40 MG tablet Take 1 tablet (40 mg total) by mouth 2 (two) times daily. 60 tablet 0   No current facility-administered medications on file  prior to visit.    Allergies  Allergen Reactions  . Citalopram Other (See Comments)    Feels odd  . Doxycycline Hyclate Nausea And Vomiting  . Erythromycin Other (See Comments)    Irritated stomach  . Montelukast Sodium Itching  . Nitrofurantoin Nausea And Vomiting  . Sulfa Antibiotics Nausea And Vomiting  . Tramadol Hcl Other (See Comments)    Feels odd  . Venlafaxine Hcl Other (See Comments)    Feels odd  . Amoxicillin-Pot Clavulanate Nausea And Vomiting  . Clarithromycin Rash     See ER note 02/2017    Past Medical History:  Diagnosis Date  . Anxiety   . Asthma   . 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 Thad Ranger  . Diabetes mellitus   . GERD (gastroesophageal reflux disease)   . Hyperlipidemia   . Hypertension     Past Surgical History:  Procedure Laterality Date  . CARDIOVASCULAR STRESS TEST  1/15   myoview. EF 60%  . CESAREAN SECTION    . ESOPHAGOGASTRODUODENOSCOPY  05/2005  . TONSILLECTOMY AND ADENOIDECTOMY      Family History  Problem Relation Age of Onset  . Coronary artery disease Mother   . Diabetes Mellitus II Mother   . Heart attack Mother 30  . Diabetes Mellitus II Maternal Grandmother     Social History   Socioeconomic History  .  Marital status: Divorced    Spouse name: Not on file  . Number of children: 2  . Years of education: Not on file  . Highest education level: Not on file  Occupational History  . Occupation: LAB TECH    Employer: [employer]  Tobacco Use  . Smoking status: Never Smoker  . Smokeless tobacco: Never Used  Vaping Use  . Vaping Use: Never used  Substance and Sexual Activity  . Alcohol use: No    Alcohol/week: 0.0 standard drinks    Comment: wine occassionally  . Drug use: No  . Sexual activity: Yes    Birth control/protection: Condom  Other Topics Concern  . Not on file  Social History Narrative  . Not on file   Social Determinants of Health   Financial Resource Strain:   .  Difficulty of Paying Living Expenses: Not on file  Food Insecurity:   . Worried About Programme researcher, broadcasting/film/video in the Last Year: Not on file  . Ran Out of Food in the Last Year: Not on file  Transportation Needs:   . Lack of Transportation (Medical): Not on file  . Lack of Transportation (Non-Medical): Not on file  Physical Activity:   . Days of Exercise per Week: Not on file  . Minutes of Exercise per Session: Not on file  Stress:   . Feeling of Stress : Not on file  Social Connections:   . Frequency of Communication with Friends and Family: Not on file  . Frequency of Social Gatherings with Friends and Family: Not on file  . Attends Religious Services: Not on file  . Active Member of Clubs or Organizations: Not on file  . Attends Banker Meetings: Not on file  . Marital Status: Not on file  Intimate Partner Violence:   . Fear of Current or Ex-Partner: Not on file  . Emotionally Abused: Not on file  . Physically Abused: Not on file  . Sexually Abused: Not on file   Review of Systems  Uses melatonin or xanax to help her sleep at night at times Appetite is variable Weight stable Feels the toprol makes her "jumpy" at times     Objective:   Physical Exam Constitutional:      General: She is not in acute distress. Cardiovascular:     Rate and Rhythm: Normal rate and regular rhythm.     Heart sounds: No murmur heard.  No gallop.   Pulmonary:     Effort: Pulmonary effort is normal.     Breath sounds: Normal breath sounds. No wheezing or rales.  Musculoskeletal:     Cervical back: Neck supple.  Lymphadenopathy:     Cervical: No cervical adenopathy.  Neurological:     Mental Status: She is alert.  Psychiatric:        Behavior: Behavior normal.     Comments: Mild depressed mood as usual            Assessment & Plan:

## 2019-10-16 NOTE — Patient Instructions (Signed)
Please start the lisinopril 5mg  daily. If your blood pressure stays over 150 regularly, I will probably want you to increase to 10mg  before you come back (let me know).

## 2019-10-16 NOTE — Assessment & Plan Note (Signed)
BP Readings from Last 3 Encounters:  10/16/19 (!) 146/78  08/31/19 130/78  08/28/19 136/66   Up some Will restart lisinopril--but only 5mg  (and titrate if her BP stays up) Continue metoprolol and amlodipine

## 2019-10-16 NOTE — Assessment & Plan Note (Addendum)
Will recheck A1c on just glipizide Lab Results  Component Value Date   HGBA1C 7.8 (A) 10/16/2019   Will continue just the glipizide as this is acceptable

## 2019-10-25 ENCOUNTER — Other Ambulatory Visit: Payer: Self-pay | Admitting: Internal Medicine

## 2019-11-15 ENCOUNTER — Ambulatory Visit (INDEPENDENT_AMBULATORY_CARE_PROVIDER_SITE_OTHER): Payer: 59 | Admitting: Internal Medicine

## 2019-11-15 ENCOUNTER — Other Ambulatory Visit: Payer: Self-pay

## 2019-11-15 ENCOUNTER — Encounter: Payer: Self-pay | Admitting: Internal Medicine

## 2019-11-15 VITALS — BP 148/80 | HR 85 | Temp 97.5°F | Wt 243.0 lb

## 2019-11-15 DIAGNOSIS — R109 Unspecified abdominal pain: Secondary | ICD-10-CM

## 2019-11-15 DIAGNOSIS — R3989 Other symptoms and signs involving the genitourinary system: Secondary | ICD-10-CM | POA: Diagnosis not present

## 2019-11-15 DIAGNOSIS — R350 Frequency of micturition: Secondary | ICD-10-CM | POA: Diagnosis not present

## 2019-11-15 DIAGNOSIS — M791 Myalgia, unspecified site: Secondary | ICD-10-CM

## 2019-11-15 DIAGNOSIS — R300 Dysuria: Secondary | ICD-10-CM | POA: Diagnosis not present

## 2019-11-15 LAB — POC URINALSYSI DIPSTICK (AUTOMATED)
Blood, UA: NEGATIVE
Glucose, UA: NEGATIVE
Nitrite, UA: NEGATIVE
Protein, UA: POSITIVE — AB
Spec Grav, UA: >=1.03 — AB (ref 1.010–1.025)
Urobilinogen, UA: 0.2 E.U./dL
pH, UA: 6 (ref 5.0–8.0)

## 2019-11-15 MED ORDER — CEPHALEXIN 500 MG PO CAPS
500.0000 mg | ORAL_CAPSULE | Freq: Two times a day (BID) | ORAL | 0 refills | Status: DC
Start: 2019-11-15 — End: 2019-11-27

## 2019-11-15 NOTE — Addendum Note (Signed)
Addended by: Roena Malady on: 11/15/2019 11:41 AM   Modules accepted: Orders

## 2019-11-15 NOTE — Progress Notes (Signed)
HPI  Pt presents to the clinic today with c/o pelvic pressure, urinary frequency, dysuria and left flank pain. This started 2-3 days ago. She denies urgency or blood in her urine. She denies vaginal discharge, odor, lesion or abnormal bleeding. She denies fever, chills or body aches. She has had some nausea but denies vomiting. She has not taken anything OTC for this. She has not history of kidney stones.  She also reports chronic muscle and joint pain. This has been going on for months. She describes the pain as achy, intermittent numbness and tingling. She denies swelling, redness or rash over the joints. She is not sure what could be causing this. She has not taken anything OTC for these symptoms.   Review of Systems  Past Medical History:  Diagnosis Date  . Anxiety   . Asthma   . 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 Thad Ranger  . Diabetes mellitus   . GERD (gastroesophageal reflux disease)   . Hyperlipidemia   . Hypertension     Family History  Problem Relation Age of Onset  . Coronary artery disease Mother   . Diabetes Mellitus II Mother   . Heart attack Mother 80  . Diabetes Mellitus II Maternal Grandmother     Social History   Socioeconomic History  . Marital status: Divorced    Spouse name: Not on file  . Number of children: 2  . Years of education: Not on file  . Highest education level: Not on file  Occupational History  . Occupation: LAB TECH    Employer: [employer]  Tobacco Use  . Smoking status: Never Smoker  . Smokeless tobacco: Never Used  Vaping Use  . Vaping Use: Never used  Substance and Sexual Activity  . Alcohol use: No    Alcohol/week: 0.0 standard drinks    Comment: wine occassionally  . Drug use: No  . Sexual activity: Yes    Birth control/protection: Condom  Other Topics Concern  . Not on file  Social History Narrative  . Not on file   Social Determinants of Health   Financial Resource Strain:   .  Difficulty of Paying Living Expenses: Not on file  Food Insecurity:   . Worried About Programme researcher, broadcasting/film/video in the Last Year: Not on file  . Ran Out of Food in the Last Year: Not on file  Transportation Needs:   . Lack of Transportation (Medical): Not on file  . Lack of Transportation (Non-Medical): Not on file  Physical Activity:   . Days of Exercise per Week: Not on file  . Minutes of Exercise per Session: Not on file  Stress:   . Feeling of Stress : Not on file  Social Connections:   . Frequency of Communication with Friends and Family: Not on file  . Frequency of Social Gatherings with Friends and Family: Not on file  . Attends Religious Services: Not on file  . Active Member of Clubs or Organizations: Not on file  . Attends Banker Meetings: Not on file  . Marital Status: Not on file  Intimate Partner Violence:   . Fear of Current or Ex-Partner: Not on file  . Emotionally Abused: Not on file  . Physically Abused: Not on file  . Sexually Abused: Not on file    Allergies  Allergen Reactions  . Citalopram Other (See Comments)    Feels odd  . Doxycycline Hyclate Nausea And Vomiting  . Erythromycin Other (See Comments)  Irritated stomach  . Montelukast Sodium Itching  . Nitrofurantoin Nausea And Vomiting  . Sulfa Antibiotics Nausea And Vomiting  . Tramadol Hcl Other (See Comments)    Feels odd  . Venlafaxine Hcl Other (See Comments)    Feels odd  . Amoxicillin-Pot Clavulanate Nausea And Vomiting  . Clarithromycin Rash     See ER note 02/2017     Constitutional: Denies fever, malaise, fatigue, headache or abrupt weight changes.   GU: Pt reports pelvic pressure, left flank pain, frequency and pain with urination. Denies blood in urine, odor or discharge. Skin: Denies redness, rashes, lesions or ulcercations.  MSK: Pt reports muscle and joint pain. Denies decrease ROM, joint swelling.  No other specific complaints in a complete review of systems (except as  listed in HPI above).    Objective:  BP (!) 148/80   Pulse 85   Temp (!) 97.5 F (36.4 C) (Temporal)   Wt 243 lb (110.2 kg)   SpO2 96%   BMI 41.71 kg/m   Wt Readings from Last 3 Encounters:  10/16/19 240 lb (108.9 kg)  08/31/19 240 lb (108.9 kg)  08/28/19 242 lb 14.4 oz (110.2 kg)    General: Appears her stated age, obese, in NAD. Cardiovascular: Normal rate and rhythm. S1,S2 noted.   Pulmonary/Chest: Normal effort and positive vesicular breath sounds. No respiratory distress. No wheezes, rales or ronchi noted.  Abdomen: Soft. Normal bowel sounds. No distention or masses noted.  Mild tenderness to palpation over the bladder area. No CVA tenderness.        Assessment & Plan:   Bladder Pressure, Frequency, Dysuria and Left Flank Pain:  Urinalysis: 3+ leuks Will send urine culture eRx sent if for Keflex 500 mg BID x 5 days OK to take AZO OTC Drink plenty of fluids  Myalgias of Upper and Lower Extremities:  Will have her hold Atorvastatin x 2 weeks If pain improves, will need to discuss with PCP about trying alternative statin medication given history of DM2 If pain does not improve, recommend restarting Atorvastatin and following up with PCP to discuss muscle and joint pains (no family history of autoimmune disease such as PMR, RA)  RTC as needed or if symptoms persist. Nicki Reaper, NP  This visit occurred during the SARS-CoV-2 public health emergency.  Safety protocols were in place, including screening questions prior to the visit, additional usage of staff PPE, and extensive cleaning of exam room while observing appropriate contact time as indicated for disinfecting solutions.

## 2019-11-15 NOTE — Patient Instructions (Signed)

## 2019-11-16 LAB — URINE CULTURE
MICRO NUMBER:: 11015790
SPECIMEN QUALITY:: ADEQUATE

## 2019-11-27 ENCOUNTER — Ambulatory Visit (INDEPENDENT_AMBULATORY_CARE_PROVIDER_SITE_OTHER): Payer: 59 | Admitting: Family Medicine

## 2019-11-27 ENCOUNTER — Encounter: Payer: Self-pay | Admitting: Family Medicine

## 2019-11-27 ENCOUNTER — Other Ambulatory Visit: Payer: Self-pay

## 2019-11-27 VITALS — BP 140/80 | HR 84 | Temp 97.3°F | Wt 241.0 lb

## 2019-11-27 DIAGNOSIS — N1831 Chronic kidney disease, stage 3a: Secondary | ICD-10-CM

## 2019-11-27 DIAGNOSIS — R52 Pain, unspecified: Secondary | ICD-10-CM | POA: Insufficient documentation

## 2019-11-27 DIAGNOSIS — R350 Frequency of micturition: Secondary | ICD-10-CM | POA: Diagnosis not present

## 2019-11-27 DIAGNOSIS — R300 Dysuria: Secondary | ICD-10-CM

## 2019-11-27 DIAGNOSIS — M25471 Effusion, right ankle: Secondary | ICD-10-CM | POA: Insufficient documentation

## 2019-11-27 DIAGNOSIS — E1159 Type 2 diabetes mellitus with other circulatory complications: Secondary | ICD-10-CM | POA: Diagnosis not present

## 2019-11-27 LAB — POC URINALSYSI DIPSTICK (AUTOMATED)
Bilirubin, UA: NEGATIVE
Blood, UA: NEGATIVE
Glucose, UA: NEGATIVE
Ketones, UA: NEGATIVE
Leukocytes, UA: NEGATIVE
Nitrite, UA: NEGATIVE
Protein, UA: POSITIVE — AB
Spec Grav, UA: >=1.03 — AB (ref 1.010–1.025)
Urobilinogen, UA: NEGATIVE E.U./dL — AB
pH, UA: 6 (ref 5.0–8.0)

## 2019-11-27 NOTE — Assessment & Plan Note (Signed)
Mild ankle swelling. Advised "ABC" ankle exercise and elevation. Also could use brace for support if needed.

## 2019-11-27 NOTE — Assessment & Plan Note (Signed)
Baseline Nocturia x 2 at night, worsening to 4. Had UA 9/30 with mixed flora and completed course of abx w/o improvement in urine symptoms. Back w/o signs of compression and she has some MSK back pain but low concern at this time for back source. Discussed hydration and avoiding irritants but strongly recommended urology referral for further evaluation as her CBG 140s are less likely to be the source of her symptoms.

## 2019-11-27 NOTE — Assessment & Plan Note (Signed)
Pt notes general feeling of being unwell since last OV ~2 weeks ago. Denies fever/cough/sob. Will get some labs to evaluate for liver/kidney/thyroid disease. If work-up normal, watch and wait.

## 2019-11-27 NOTE — Patient Instructions (Signed)
Urine symptoms - Drink lots of water - Avoid drinking 2 hours before bed - limit caffeine and alcohol as these can irritate the bladder - referral to urology  Labs to look for something that could cause your general unwellness

## 2019-11-27 NOTE — Assessment & Plan Note (Signed)
Last hgb a1c was elevated, but reports CBGs 140s at home. Lower suspicion of diabetes causing urinary symptoms at this time. But will check labs to assess.

## 2019-11-27 NOTE — Assessment & Plan Note (Signed)
UA with protein. Will look for worsening kidney function

## 2019-11-27 NOTE — Progress Notes (Signed)
Subjective:     Tami Jones is a 64 y.o. female presenting for Dysuria, Urinary Frequency, and Hand Pain (and leg pain "hard to explain" )     HPI   #Frequency - woke up last night 4 times to use the bathroom - during the day will go 4-5 times - blood sugars 145 last week - was seen on 9/30 and treated for UTI - though culture not positive - endorses urgency and incontinence  Lab Results  Component Value Date   HGBA1C 7.8 (A) 10/16/2019   - endorses body aches since this started 2 weeks ago - legs, arms, hands - more muscle based - denies wrist/finger pain - endorses swelling in the right foot x 2 weeks  No prior hx of incontinence  Nocturia - 2 times      Review of Systems  Constitutional: Negative for chills and fever.  HENT: Negative for congestion and rhinorrhea.   Respiratory: Negative for cough and shortness of breath.   Gastrointestinal: Positive for nausea (chronic). Negative for constipation and diarrhea.  Genitourinary: Positive for dysuria, frequency and urgency. Negative for hematuria.  Neurological: Negative for numbness.     Social History   Tobacco Use  Smoking Status Never Smoker  Smokeless Tobacco Never Used        Objective:    BP Readings from Last 3 Encounters:  11/27/19 140/80  11/15/19 (!) 148/80  10/16/19 (!) 146/78   Wt Readings from Last 3 Encounters:  11/27/19 241 lb (109.3 kg)  11/15/19 243 lb (110.2 kg)  10/16/19 240 lb (108.9 kg)    BP 140/80   Pulse 84   Temp (!) 97.3 F (36.3 C) (Temporal)   Wt 241 lb (109.3 kg)   SpO2 99%   BMI 41.37 kg/m    Physical Exam Constitutional:      General: She is not in acute distress.    Appearance: She is well-developed. She is obese. She is not diaphoretic.  HENT:     Right Ear: External ear normal.     Left Ear: External ear normal.     Nose: Nose normal.  Eyes:     Conjunctiva/sclera: Conjunctivae normal.  Cardiovascular:     Rate and Rhythm: Normal rate and  regular rhythm.     Heart sounds: No murmur heard.   Pulmonary:     Effort: Pulmonary effort is normal. No respiratory distress.     Breath sounds: Normal breath sounds. No wheezing.  Abdominal:     General: Abdomen is flat. Bowel sounds are normal. There is no distension.     Tenderness: There is abdominal tenderness in the right upper quadrant and epigastric area. There is no right CVA tenderness, left CVA tenderness, guarding or rebound. Negative signs include Murphy's sign.  Musculoskeletal:     Cervical back: Neck supple.     Comments: Back: No spinous TTP Lumbar paraspinous TTP on the left Negative straight leg raise  Right ankle Inspection: mild swelling distal to the lateral malleolus Palpation: no ttp ROM: normal Strength: normal Ligaments: intact  Skin:    General: Skin is warm and dry.     Capillary Refill: Capillary refill takes less than 2 seconds.  Neurological:     Mental Status: She is alert. Mental status is at baseline.     Comments: Ambulates with cane  Psychiatric:        Mood and Affect: Mood normal.        Behavior: Behavior normal.  UA: neg nitrites, neg LE, + protein       Assessment & Plan:   Problem List Items Addressed This Visit      Cardiovascular and Mediastinum   Type 2 diabetes mellitus with other circulatory complications (HCC)    Last hgb a1c was elevated, but reports CBGs 140s at home. Lower suspicion of diabetes causing urinary symptoms at this time. But will check labs to assess.       Relevant Orders   CBC   Ferritin   TSH   VITAMIN D 25 Hydroxy (Vit-D Deficiency, Fractures)   Comprehensive metabolic panel     Genitourinary   CKD (chronic kidney disease), stage IIIa    UA with protein. Will look for worsening kidney function      Relevant Orders   CBC   Ferritin   TSH   VITAMIN D 25 Hydroxy (Vit-D Deficiency, Fractures)   Comprehensive metabolic panel     Other   Dysuria   Relevant Orders   POCT Urinalysis  Dipstick (Automated) (Completed)   CBC   Ferritin   TSH   VITAMIN D 25 Hydroxy (Vit-D Deficiency, Fractures)   Comprehensive metabolic panel   Body aches    Pt notes general feeling of being unwell since last OV ~2 weeks ago. Denies fever/cough/sob. Will get some labs to evaluate for liver/kidney/thyroid disease. If work-up normal, watch and wait.       Relevant Orders   CBC   Ferritin   TSH   VITAMIN D 25 Hydroxy (Vit-D Deficiency, Fractures)   Comprehensive metabolic panel   Right ankle swelling    Mild ankle swelling. Advised "ABC" ankle exercise and elevation. Also could use brace for support if needed.       Relevant Orders   CBC   Ferritin   TSH   VITAMIN D 25 Hydroxy (Vit-D Deficiency, Fractures)   Comprehensive metabolic panel   Urinary frequency - Primary    Baseline Nocturia x 2 at night, worsening to 4. Had UA 9/30 with mixed flora and completed course of abx w/o improvement in urine symptoms. Back w/o signs of compression and she has some MSK back pain but low concern at this time for back source. Discussed hydration and avoiding irritants but strongly recommended urology referral for further evaluation as her CBG 140s are less likely to be the source of her symptoms.       Relevant Orders   Ambulatory referral to Urology   CBC   Ferritin   TSH   VITAMIN D 25 Hydroxy (Vit-D Deficiency, Fractures)   Comprehensive metabolic panel       Return if symptoms worsen or fail to improve.  Lynnda Child, MD  This visit occurred during the SARS-CoV-2 public health emergency.  Safety protocols were in place, including screening questions prior to the visit, additional usage of staff PPE, and extensive cleaning of exam room while observing appropriate contact time as indicated for disinfecting solutions.

## 2019-11-28 ENCOUNTER — Other Ambulatory Visit: Payer: Self-pay | Admitting: Family Medicine

## 2019-11-28 DIAGNOSIS — E559 Vitamin D deficiency, unspecified: Secondary | ICD-10-CM

## 2019-11-28 LAB — COMPREHENSIVE METABOLIC PANEL
ALT: 28 IU/L (ref 0–32)
AST: 21 IU/L (ref 0–40)
Albumin/Globulin Ratio: 1.7 (ref 1.2–2.2)
Albumin: 4.3 g/dL (ref 3.8–4.8)
Alkaline Phosphatase: 97 IU/L (ref 44–121)
BUN/Creatinine Ratio: 13 (ref 12–28)
BUN: 13 mg/dL (ref 8–27)
Bilirubin Total: 0.3 mg/dL (ref 0.0–1.2)
CO2: 24 mmol/L (ref 20–29)
Calcium: 9.4 mg/dL (ref 8.7–10.3)
Chloride: 102 mmol/L (ref 96–106)
Creatinine, Ser: 1.01 mg/dL — ABNORMAL HIGH (ref 0.57–1.00)
GFR calc Af Amer: 69 mL/min/{1.73_m2} (ref >59)
GFR calc non Af Amer: 60 mL/min/{1.73_m2} (ref >59)
Globulin, Total: 2.5 g/dL (ref 1.5–4.5)
Glucose: 168 mg/dL — ABNORMAL HIGH (ref 65–99)
Potassium: 4.5 mmol/L (ref 3.5–5.2)
Sodium: 139 mmol/L (ref 134–144)
Total Protein: 6.8 g/dL (ref 6.0–8.5)

## 2019-11-28 LAB — VITAMIN D 25 HYDROXY (VIT D DEFICIENCY, FRACTURES): Vit D, 25-Hydroxy: 12.6 ng/mL — ABNORMAL LOW (ref 30.0–100.0)

## 2019-11-28 LAB — CBC
Hematocrit: 41.3 % (ref 34.0–46.6)
Hemoglobin: 14.1 g/dL (ref 11.1–15.9)
MCH: 31.4 pg (ref 26.6–33.0)
MCHC: 34.1 g/dL (ref 31.5–35.7)
MCV: 92 fL (ref 79–97)
Platelets: 258 10*3/uL (ref 150–450)
RBC: 4.49 x10E6/uL (ref 3.77–5.28)
RDW: 12.4 % (ref 11.7–15.4)
WBC: 5.4 10*3/uL (ref 3.4–10.8)

## 2019-11-28 LAB — TSH: TSH: 3.38 u[IU]/mL (ref 0.450–4.500)

## 2019-11-28 LAB — FERRITIN: Ferritin: 104 ng/mL (ref 15–150)

## 2019-11-28 MED ORDER — CHOLECALCIFEROL 1.25 MG (50000 UT) PO TABS: 1.0000 | ORAL_TABLET | ORAL | 1 refills | Status: DC

## 2019-11-28 NOTE — Progress Notes (Signed)
Low vitamin D Sending replacement therapy Will send MyChart to notify patient

## 2019-12-20 ENCOUNTER — Emergency Department: Payer: 59

## 2019-12-20 ENCOUNTER — Encounter: Payer: Self-pay | Admitting: *Deleted

## 2019-12-20 ENCOUNTER — Other Ambulatory Visit: Payer: Self-pay

## 2019-12-20 ENCOUNTER — Emergency Department
Admission: EM | Admit: 2019-12-20 | Discharge: 2019-12-20 | Disposition: A | Discharge status: Home / Self Care | Payer: 59 | Attending: Student in an Organized Health Care Education/Training Program | Admitting: Student in an Organized Health Care Education/Training Program

## 2019-12-20 DIAGNOSIS — Z7982 Long term (current) use of aspirin: Secondary | ICD-10-CM | POA: Diagnosis not present

## 2019-12-20 DIAGNOSIS — E1122 Type 2 diabetes mellitus with diabetic chronic kidney disease: Secondary | ICD-10-CM | POA: Insufficient documentation

## 2019-12-20 DIAGNOSIS — Z79899 Other long term (current) drug therapy: Secondary | ICD-10-CM | POA: Insufficient documentation

## 2019-12-20 DIAGNOSIS — R600 Localized edema: Secondary | ICD-10-CM | POA: Diagnosis not present

## 2019-12-20 DIAGNOSIS — Z7984 Long term (current) use of oral hypoglycemic drugs: Secondary | ICD-10-CM | POA: Diagnosis not present

## 2019-12-20 DIAGNOSIS — E1159 Type 2 diabetes mellitus with other circulatory complications: Secondary | ICD-10-CM | POA: Insufficient documentation

## 2019-12-20 DIAGNOSIS — J45909 Unspecified asthma, uncomplicated: Secondary | ICD-10-CM | POA: Diagnosis not present

## 2019-12-20 DIAGNOSIS — N1831 Chronic kidney disease, stage 3a: Secondary | ICD-10-CM | POA: Diagnosis not present

## 2019-12-20 DIAGNOSIS — I10 Essential (primary) hypertension: Secondary | ICD-10-CM

## 2019-12-20 DIAGNOSIS — E785 Hyperlipidemia, unspecified: Secondary | ICD-10-CM | POA: Diagnosis not present

## 2019-12-20 DIAGNOSIS — E1169 Type 2 diabetes mellitus with other specified complication: Secondary | ICD-10-CM | POA: Insufficient documentation

## 2019-12-20 DIAGNOSIS — I129 Hypertensive chronic kidney disease with stage 1 through stage 4 chronic kidney disease, or unspecified chronic kidney disease: Secondary | ICD-10-CM | POA: Insufficient documentation

## 2019-12-20 DIAGNOSIS — M7989 Other specified soft tissue disorders: Secondary | ICD-10-CM

## 2019-12-20 DIAGNOSIS — R030 Elevated blood-pressure reading, without diagnosis of hypertension: Secondary | ICD-10-CM | POA: Diagnosis present

## 2019-12-20 LAB — CBC
HCT: 42.2 % (ref 36.0–46.0)
Hemoglobin: 14.3 g/dL (ref 12.0–15.0)
MCH: 30.6 pg (ref 26.0–34.0)
MCHC: 33.9 g/dL (ref 30.0–36.0)
MCV: 90.2 fL (ref 80.0–100.0)
Platelets: 278 10*3/uL (ref 150–400)
RBC: 4.68 MIL/uL (ref 3.87–5.11)
RDW: 12.5 % (ref 11.5–15.5)
WBC: 7.8 10*3/uL (ref 4.0–10.5)
nRBC: 0 % (ref 0.0–0.2)

## 2019-12-20 LAB — BASIC METABOLIC PANEL
Anion gap: 11 (ref 5–15)
BUN: 16 mg/dL (ref 8–23)
CO2: 25 mmol/L (ref 22–32)
Calcium: 9.2 mg/dL (ref 8.9–10.3)
Chloride: 101 mmol/L (ref 98–111)
Creatinine, Ser: 0.93 mg/dL (ref 0.44–1.00)
GFR, Estimated: >60 mL/min (ref >60)
Glucose, Bld: 202 mg/dL — ABNORMAL HIGH (ref 70–99)
Potassium: 3.8 mmol/L (ref 3.5–5.1)
Sodium: 137 mmol/L (ref 135–145)

## 2019-12-20 LAB — TROPONIN I (HIGH SENSITIVITY)
Troponin I (High Sensitivity): 8 ng/L (ref <18)
Troponin I (High Sensitivity): 8 ng/L (ref <18)

## 2019-12-20 MED ORDER — METOPROLOL SUCCINATE ER 50 MG PO TB24
25.0000 mg | ORAL_TABLET | Freq: Every day | ORAL | Status: DC
  Administered 2019-12-20: 25 mg via ORAL
  Filled 2019-12-20: qty 1

## 2019-12-20 MED ORDER — MEDICAL COMPRESSION SOCKS MISC: 1.0000 | Freq: Every day | 1 refills | Status: DC

## 2019-12-20 NOTE — ED Provider Notes (Signed)
[facility] Emergency Department Provider Note    First MD Initiated Contact with Patient 12/20/19 [phone]     (approximate)  I have reviewed the triage vital signs and the nursing notes.   HISTORY  Chief Complaint Hypertension and Leg Swelling    HPI Gabbi Whetstone Bissonnette is a 64 y.o. female the below listed past medical history presents to the ER over concern for elevated blood pressure at home last night.  States she has been having some achiness in her leg and noted some swelling over the past several weeks.  She is on amlodipine.  No recent blood pressure medications.  States that she checked it last night and was systolic in the 200s.  She took half a Xanax with improvement in her blood pressure.  Denies any headaches.  No numbness or tingling no weakness, no blurry vision.  No chest pain or shortness of breath.  She is otherwise been compliant with her medications.  Denies any history of DVT.    Past Medical History:  Diagnosis Date  . Anxiety   . Asthma   . 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 Thad Ranger  . Diabetes mellitus   . GERD (gastroesophageal reflux disease)   . Hyperlipidemia   . Hypertension    Family History  Problem Relation Age of Onset  . Coronary artery disease Mother   . Diabetes Mellitus II Mother   . Heart attack Mother 65  . Diabetes Mellitus II Maternal Grandmother    Past Surgical History:  Procedure Laterality Date  . CARDIOVASCULAR STRESS TEST  1/15   myoview. EF 60%  . CESAREAN SECTION    . ESOPHAGOGASTRODUODENOSCOPY  05/2005  . TONSILLECTOMY AND ADENOIDECTOMY     Patient Active Problem List   Diagnosis Date Noted  . Body aches 11/27/2019  . Right ankle swelling 11/27/2019  . Urinary frequency 11/27/2019  . Nausea & vomiting 08/26/2019  . History of CVA with residual deficit 08/25/2019  . Acute metabolic encephalopathy   . Intractable headache   . Nausea   . Anxiety   .  Hypertensive emergency, no CHF 08/01/2019  . Elevated troponin 08/01/2019  . Hypertension   . HLD (hyperlipidemia)   . Type 2 diabetes mellitus with hyperlipidemia (HCC)   . CKD (chronic kidney disease), stage IIIa   . Acute CVA (cerebrovascular accident) (HCC)   . Depression   . Gross hematuria 01/15/2019  . Dysuria 01/15/2019  . Right shoulder pain 12/04/2018  . Neuropathic pain, arm 10/26/2018  . MDD (major depressive disorder), single episode, moderate (HCC) 09/21/2018  . Hemiparesis of right dominant side (HCC) 08/17/2018  . SOB (shortness of breath) 03/08/2017  . Vestibular dizziness 01/21/2016  . Weakness 09/22/2015  . Tick bite 07/15/2015  . Acute kidney injury superimposed on CKD (HCC) 07/17/2014  . Cerebrovascular disease 07/17/2014  . Preventative health care 06/25/2014  . Right sided sciatica 03/08/2013  . Chest pain 01/24/2012  . MENOPAUSAL SYNDROME 09/10/2008  . Mood disorder (HCC) 09/15/2007  . Essential hypertension, benign 08/10/2006  . Type 2 diabetes mellitus with other circulatory complications (HCC) 06/17/2006  . HYPERCHOLESTEROLEMIA 06/17/2006  . GERD 06/17/2006      Prior to Admission medications   Medication Sig Start Date End Date Taking? Authorizing Provider  acetaminophen (TYLENOL) 325 MG tablet Take 650 mg by mouth every 6 (six) hours as needed.    [provider]  ALPRAZolam Prudy Feeler) 0.25 MG tablet Take 1 tablet (0.25 mg  total) by mouth 3 (three) times daily as needed for anxiety or sleep. 10/16/19   Karie Schwalbe, MD  amLODipine (NORVASC) 10 MG tablet Take 1 tablet (10 mg total) by mouth every evening. 08/28/19   Alford Highland, MD  aspirin EC 81 MG tablet Take 81 mg by mouth every evening.     [provider]  atorvastatin (LIPITOR) 80 MG tablet Take 1 tablet (80 mg total) by mouth every evening. Patient not taking: Reported on 11/27/2019 08/28/19 08/27/20  Alford Highland, MD  Cholecalciferol 1.25 MG (50000 UT) TABS Take 1  tablet by mouth once a week. 11/28/19   Lynnda Child, MD  clopidogrel (PLAVIX) 75 MG tablet Take 1 tablet (75 mg total) by mouth daily. 08/29/19   Alford Highland, MD  Elastic Bandages & Supports (MEDICAL COMPRESSION SOCKS) MISC 1 each by Does not apply route daily. 12/20/19   Willy Eddy, MD  glipiZIDE (GLUCOTROL XL) 5 MG 24 hr tablet TAKE 1 TABLET BY MOUTH EVERY DAY WITH BREAKFAST 10/25/19   Tillman Abide I, MD  lisinopril (ZESTRIL) 5 MG tablet Take 1 tablet (5 mg total) by mouth daily. 10/16/19   Karie Schwalbe, MD  MELATONIN GUMMIES PO Take 1 tablet by mouth at bedtime as needed.    [provider]  metoprolol succinate (TOPROL-XL) 25 MG 24 hr tablet Take 1 tablet (25 mg total) by mouth daily. 08/08/19 08/07/20  Karie Schwalbe, MD  pantoprazole (PROTONIX) 40 MG tablet Take 1 tablet (40 mg total) by mouth 2 (two) times daily. 08/28/19   Alford Highland, MD    Allergies Citalopram, Doxycycline hyclate, Erythromycin, Montelukast sodium, Nitrofurantoin, Sulfa antibiotics, Tramadol hcl, Venlafaxine hcl, Amoxicillin-pot clavulanate, and Clarithromycin    Social History Social History   Tobacco Use  . Smoking status: Never Smoker  . Smokeless tobacco: Never Used  Vaping Use  . Vaping Use: Never used  Substance Use Topics  . Alcohol use: No    Alcohol/week: 0.0 standard drinks    Comment: wine occassionally  . Drug use: No    Review of Systems Patient denies headaches, rhinorrhea, blurry vision, numbness, shortness of breath, chest pain, edema, cough, abdominal pain, nausea, vomiting, diarrhea, dysuria, fevers, rashes or hallucinations unless otherwise stated above in HPI. ____________________________________________   PHYSICAL EXAM:  VITAL SIGNS: Vitals:   12/20/19 0502 12/20/19 0848  BP: (!) 158/85 (!) 160/84  Pulse: 98 79  Resp: 16 16  Temp:    SpO2: 98% 99%    Constitutional: Alert and oriented.  Eyes: Conjunctivae are normal.  Head:  Atraumatic. Nose: No congestion/rhinnorhea. Mouth/Throat: Mucous membranes are moist.   Neck: No stridor. Painless ROM.  Cardiovascular: Normal rate, regular rhythm. Grossly normal heart sounds.  Good peripheral circulation. Respiratory: Normal respiratory effort.  No retractions. Lungs CTAB. Gastrointestinal: Soft and nontender. No distention. No abdominal bruits. No CVA tenderness. Genitourinary:  Musculoskeletal: No lower extremity tenderness.  There is pitting edema right greater than left but is only mild.  No skin changes.  Strong PT and DP pulses..  No joint effusions. Neurologic:  Normal speech and language. No gross focal neurologic deficits are appreciated. No facial droop Skin:  Skin is warm, dry and intact. No rash noted. Psychiatric: Mood and affect are normal. Speech and behavior are normal.  ____________________________________________   LABS (all labs ordered are listed, but only abnormal results are displayed)  Results for orders placed or performed during the hospital encounter of 12/20/19 (from the past 24 hour(s))  Basic metabolic  panel     Status: Abnormal   Collection Time: 12/20/19  1:12 AM  Result Value Ref Range   Sodium 137 135 - 145 mmol/L   Potassium 3.8 3.5 - 5.1 mmol/L   Chloride 101 98 - 111 mmol/L   CO2 25 22 - 32 mmol/L   Glucose, Bld 202 (H) 70 - 99 mg/dL   BUN 16 8 - 23 mg/dL   Creatinine, Ser 8.11 0.44 - 1.00 mg/dL   Calcium 9.2 8.9 - 91.4 mg/dL   GFR, Estimated >78 >29 mL/min   Anion gap 11 5 - 15  CBC     Status: None   Collection Time: 12/20/19  1:12 AM  Result Value Ref Range   WBC 7.8 4.0 - 10.5 K/uL   RBC 4.68 3.87 - 5.11 MIL/uL   Hemoglobin 14.3 12.0 - 15.0 g/dL   HCT 56.2 36 - 46 %   MCV 90.2 80.0 - 100.0 fL   MCH 30.6 26.0 - 34.0 pg   MCHC 33.9 30.0 - 36.0 g/dL   RDW 13.0 86.5 - 78.4 %   Platelets 278 150 - 400 K/uL   nRBC 0.0 0.0 - 0.2 %  Troponin I (High Sensitivity)     Status: None   Collection Time: 12/20/19  1:12 AM   Result Value Ref Range   Troponin I (High Sensitivity) 8 <18 ng/L  Troponin I (High Sensitivity)     Status: None   Collection Time: 12/20/19  3:09 AM  Result Value Ref Range   Troponin I (High Sensitivity) 8 <18 ng/L   ____________________________________________  EKG My review and personal interpretation at Time: 1:05   Indication: htn  Rate: 110  Rhythm: sinus Axis: normal Other: normal intervals, no stemi, nonspecific st abn ____________________________________________  RADIOLOGY  I personally reviewed all radiographic images ordered to evaluate for the above acute complaints and reviewed radiology reports and findings.  These findings were personally discussed with the patient.  Please see medical record for radiology report.  ____________________________________________   PROCEDURES  Procedure(s) performed:  Procedures    Critical Care performed: no ____________________________________________   INITIAL IMPRESSION / ASSESSMENT AND PLAN / ED COURSE  Pertinent labs & imaging results that were available during my care of the patient were reviewed by me and considered in my medical decision making (see chart for details).   DDX: Hypertensive urgency, hypertension, medication noncompliance, medication effect, DVT, cellulitis, lymphedema, CHF  Dorianne Perret Batalla is a 64 y.o. who presents to the ED with presentation with concern for elevated blood pressure. Blood pressures have improved though still mildly hypertensive. She not having any symptoms of TIA or CVA. No neuro deficits. She not having chest pain or shortness of breath. EKG with nonspecific changes but serial enzymes are nonischemic. She is not having any evidence of acute heart failure. Lower extremity swelling without evidence of DVT may be secondary to amlodipine.  Clinical Course as of Dec 19 899  Thu Dec 20, 2019  6962 Patient reassessed. She feels well. Blood pressure is stable. She does not have any signs of  endorgan damage. Given her allergies intolerances to previous antihypertensive I think it is more appropriate that she follow-up with her PCP for further titration and decision on antihypertensive management. Also any indication for hospitalization at this time. Patient agreeable to plan.   [PR]    Clinical Course User Index [PR] Willy Eddy, MD    The patient was evaluated in Emergency Department today for the symptoms described  in the history of present illness. He/she was evaluated in the context of the global COVID-19 pandemic, which necessitated consideration that the patient might be at risk for infection with the SARS-CoV-2 virus that causes COVID-19. Institutional protocols and algorithms that pertain to the evaluation of patients at risk for COVID-19 are in a state of rapid change based on information released by regulatory bodies including the CDC and federal and state organizations. These policies and algorithms were followed during the patient's care in the ED.  As part of my medical decision making, I reviewed the following data within the electronic MEDICAL RECORD NUMBER Nursing notes reviewed and incorporated, Labs reviewed, notes from prior ED visits and [place] Controlled Substance Database   ____________________________________________   FINAL CLINICAL IMPRESSION(S) / ED DIAGNOSES  Final diagnoses:  Hypertension, unspecified type  Leg swelling      NEW MEDICATIONS STARTED DURING THIS VISIT:  New Prescriptions   ELASTIC BANDAGES & SUPPORTS (MEDICAL COMPRESSION SOCKS) MISC    1 each by Does not apply route daily.     Note:  This document was prepared using Dragon voice recognition software and may include unintentional dictation errors.    Willy Eddyobinson, Darianne Muralles, MD 12/20/19 [phone]

## 2019-12-20 NOTE — ED Triage Notes (Signed)
Pt to ED reporting she has been unable to manage her BP at home today. In the past two hours pt reports BP was 202/109 before she came to ED.   Pt took half of a Xanax 60 minutes ago but denies relief to this point.   Pain in right leg from foot to groin per pt. Swelling noted to right ankle. No tenderness upon palpation.

## 2019-12-21 ENCOUNTER — Telehealth: Payer: Self-pay

## 2019-12-21 NOTE — Telephone Encounter (Signed)
Tried to call pt to see how she was doing after recent ER visit for elevated BP. VM is full so I could not leave a VM. I will send a message on MyChart.

## 2020-01-02 NOTE — Telephone Encounter (Signed)
Access nurse called in stating pt was triage due to swelling in both legs again. Did advise to be seen at urgent care/ER and patient refused. Did advise to be seen within 4 hours. No appointment available. Sending to PCP/ CMA to assess

## 2020-01-02 NOTE — Telephone Encounter (Signed)
We always have trouble getting in contact with her. We are never able to leave a message.

## 2020-01-03 ENCOUNTER — Telehealth: Payer: Self-pay

## 2020-01-03 NOTE — Telephone Encounter (Signed)
Unable to reach pt by phone;v/m box is full. Sending note to Dr Alphonsus Sias and Carollee Herter CMA. Please also see 12/21/19 note.

## 2020-01-03 NOTE — Telephone Encounter (Deleted)
[facility] - Client TELEPHONE ADVICE RECORD [facility] Patient Name: Tami Jones Gender: Female DOB: 09/12/1955 Age: 64 Y 12 D Return Phone Number: [phone] (Primary) Address: City/State/Zip: [place] [address] Client [facility] - Client Client Site [facility] - Day Physician Letvak, Richard- MD Contact Type Call Who Is Calling Patient / Member / Family / Caregiver Call Type Triage / Clinical Relationship To Patient Self Return Phone Number [phone] (Primary) Chief Complaint Blood Sugar High Reason for Call Symptomatic / Request for Health Information Initial Comment Caller states she was just discharged from the hospital and she has a blood sugar level is ranging 174-202 and feet swelling. Caller states her left foot swells more. Caller states shs has taking her Glipizide every morning. Translation No Nurse Assessment Nurse: Carmon, RN, Denise Date/Time (Eastern Time): 01/02/2020 4:00:27 PM Confirm and document reason for call. If symptomatic, describe symptoms. ---Caller states she was just discharged from the hospital on 12/20/19 s/p HTN and edema in leg/foot. Also she has a blood sugar level is ranging 174-202 and feet swelling. Caller states her left foot swells more. Caller states she has taking her Glipizide every morning. Has not checked BG today. Does the patient have any new or worsening symptoms? ---Yes Will a triage be completed? ---Yes Related visit to physician within the last 2 weeks? ---Yes Does the PT have any chronic conditions? (i.e. diabetes, asthma, this includes High risk factors for pregnancy, etc.) ---Yes List chronic conditions. ---Diabetes, CVA 2020 Is this a behavioral health or substance abuse call? ---No Guidelines Guideline Title Affirmed Question Affirmed Notes Nurse Date/Time (Eastern Time) Leg Swelling and Edema [1] Thigh, calf, or ankle swelling AND [2]  only 1 side Carmon, RN, Denise 01/02/2020 4:02:38 PM Disp. Time (Eastern Time) Disposition Final User 01/02/2020 4:12:57 PM Call Completed Carmon, RN, Denise PLEASE NOTE: All timestamps contained within this report are represented as Eastern Standard Time. CONFIDENTIALTY NOTICE: This fax transmission is intended only for the addressee. It contains information that is legally privileged, confidential or otherwise protected from use or disclosure. If you are not the intended recipient, you are strictly prohibited from reviewing, disclosing, copying using or disseminating any of this information or taking any action in reliance on or regarding this information. If you have received this fax in error, please notify us immediately by telephone so that we can arrange for its return to us. Phone: [phone], Toll-Free: [phone], Fax: [phone] Page: 2 of 2 Call Id: [id number] 01/02/2020 4:06:01 PM See HCP within 4 Hours (or PCP triage) Yes Carmon, RN, Denise Caller Disagree/Comply Disagree Caller Understands Yes PreDisposition Call Doctor Care Advice Given Per Guideline SEE HCP (OR PCP TRIAGE) WITHIN 4 HOURS: CALL EMS IF: * Chest pain or shortness of breath occurs. CARE ADVICE given per Leg Swelling and Edema (Adult) guideline. Comments User: Denise, Carmon, RN Date/Time (Eastern Time): 01/02/2020 4:12:39 PM Called MDO BL per profile directive and pt report given. MDO to call pt w/further inst. Referrals GO TO FACILITY REFUSED 

## 2020-01-03 NOTE — Telephone Encounter (Signed)
Some of the swelling may be related to amlodipine---have her cut the 10mg  in half and only take 5mg  daily She should also double the lisinopril and take 2 of the 5mg  daily (10mg ) The sugar is not an immediate concern--continue the glipizide  Put her in to one of my same day appts tomorrow if she can make it

## 2020-01-03 NOTE — Telephone Encounter (Signed)
Spoke to pt. She is unable to come in tomorrow or Monday. Made appt for Tuesday the 23rd at 11am.

## 2020-01-03 NOTE — Telephone Encounter (Deleted)
Unable to reach pt by phone;v/m box is full. Sending note to Dr Letvak and Shannon CMA. Please also see 12/21/19 note. 

## 2020-01-03 NOTE — Telephone Encounter (Signed)
[facility] - Client TELEPHONE ADVICE RECORD [facility] Patient Name: Tami Jones Gender: Female DOB: 06/15/1955 Age: 64 Y 12 D Return Phone Number: [phone] (Primary) Address: City/State/Zip: [place] [address] Client [facility] - Client Client Site [facility] - Day Physician Letvak, Richard- MD Contact Type Call Who Is Calling Patient / Member / Family / Caregiver Call Type Triage / Clinical Relationship To Patient Self Return Phone Number [phone] (Primary) Chief Complaint Blood Sugar High Reason for Call Symptomatic / Request for Health Information Initial Comment Caller states she was just discharged from the hospital and she has a blood sugar level is ranging 174-202 and feet swelling. Caller states her left foot swells more. Caller states shs has taking her Glipizide every morning. Translation No Nurse Assessment Nurse: Carmon, RN, Denise Date/Time (Eastern Time): 01/02/2020 4:00:27 PM Confirm and document reason for call. If symptomatic, describe symptoms. ---Caller states she was just discharged from the hospital on 12/20/19 s/p HTN and edema in leg/foot. Also she has a blood sugar level is ranging 174-202 and feet swelling. Caller states her left foot swells more. Caller states she has taking her Glipizide every morning. Has not checked BG today. Does the patient have any new or worsening symptoms? ---Yes Will a triage be completed? ---Yes Related visit to physician within the last 2 weeks? ---Yes Does the PT have any chronic conditions? (i.e. diabetes, asthma, this includes High risk factors for pregnancy, etc.) ---Yes List chronic conditions. ---Diabetes, CVA 2020 Is this a behavioral health or substance abuse call? ---No Guidelines Guideline Title Affirmed Question Affirmed Notes Nurse Date/Time (Eastern Time) Leg Swelling and Edema [1] Thigh, calf, or ankle swelling AND [2]  only 1 side Carmon, RN, Denise 01/02/2020 4:02:38 PM Disp. Time (Eastern Time) Disposition Final User 01/02/2020 4:12:57 PM Call Completed Carmon, RN, Denise PLEASE NOTE: All timestamps contained within this report are represented as Eastern Standard Time. CONFIDENTIALTY NOTICE: This fax transmission is intended only for the addressee. It contains information that is legally privileged, confidential or otherwise protected from use or disclosure. If you are not the intended recipient, you are strictly prohibited from reviewing, disclosing, copying using or disseminating any of this information or taking any action in reliance on or regarding this information. If you have received this fax in error, please notify us immediately by telephone so that we can arrange for its return to us. Phone: [phone], Toll-Free: [phone], Fax: [phone] Page: 2 of 2 Call Id: [id number] 01/02/2020 4:06:01 PM See HCP within 4 Hours (or PCP triage) Yes Carmon, RN, Denise Caller Disagree/Comply Disagree Caller Understands Yes PreDisposition Call Doctor Care Advice Given Per Guideline SEE HCP (OR PCP TRIAGE) WITHIN 4 HOURS: CALL EMS IF: * Chest pain or shortness of breath occurs. CARE ADVICE given per Leg Swelling and Edema (Adult) guideline. Comments User: Denise, Carmon, RN Date/Time (Eastern Time): 01/02/2020 4:12:39 PM Called MDO BL per profile directive and pt report given. MDO to call pt w/further inst. Referrals GO TO FACILITY REFUSED 

## 2020-01-03 NOTE — Telephone Encounter (Signed)
Attached this info to 12/21/19 note.

## 2020-01-08 ENCOUNTER — Ambulatory Visit: Payer: 59 | Admitting: Internal Medicine

## 2020-01-28 ENCOUNTER — Other Ambulatory Visit: Payer: Self-pay | Admitting: Internal Medicine

## 2020-01-31 ENCOUNTER — Other Ambulatory Visit: Payer: Self-pay | Admitting: Family Medicine

## 2020-02-05 ENCOUNTER — Encounter: Payer: Self-pay | Admitting: Internal Medicine

## 2020-02-05 ENCOUNTER — Other Ambulatory Visit: Payer: Self-pay

## 2020-02-05 ENCOUNTER — Telehealth: Payer: Self-pay | Admitting: Internal Medicine

## 2020-02-05 ENCOUNTER — Telehealth (INDEPENDENT_AMBULATORY_CARE_PROVIDER_SITE_OTHER): Payer: 59 | Admitting: Internal Medicine

## 2020-02-05 VITALS — BP 155/87 | Ht 64.0 in | Wt 232.0 lb

## 2020-02-05 DIAGNOSIS — J0110 Acute frontal sinusitis, unspecified: Secondary | ICD-10-CM | POA: Diagnosis not present

## 2020-02-05 MED ORDER — AMOXICILLIN 500 MG PO TABS
1000.0000 mg | ORAL_TABLET | Freq: Two times a day (BID) | ORAL | 0 refills | Status: AC
Start: 2020-02-05 — End: 2020-02-12

## 2020-02-05 NOTE — Progress Notes (Signed)
Subjective:    Patient ID: Tami Jones, female    DOB: 22-Apr-1955, 64 y.o.   MRN: [id number]  HPI Video virtual visit for respiratory symptoms for over 2 weeks Identification done Reviewed billing and limitations and she gave consent Participants--patient and daughter  in her home and I am in my office  Has had "cold" ---probably from grandkids They tested negative for COVID Has cough, rhinorrhea, chest congestion Bad head congestion  Lots of post nasal drip Cough produces mucus---yellow from nose as well  No fever No SOB  Tried tylenol ---unclear if it helps Some frontal headache--?from the cough  Current Outpatient Medications on File Prior to Visit  Medication Sig Dispense Refill   acetaminophen (TYLENOL) 325 MG tablet Take 650 mg by mouth every 6 (six) hours as needed.     ALPRAZolam (XANAX) 0.25 MG tablet Take 1 tablet (0.25 mg total) by mouth 3 (three) times daily as needed for anxiety or sleep. 30 tablet 0   amLODipine (NORVASC) 10 MG tablet Take 1 tablet (10 mg total) by mouth every evening. 90 tablet 3   aspirin EC 81 MG tablet Take 81 mg by mouth every evening.      atorvastatin (LIPITOR) 80 MG tablet Take 1 tablet (80 mg total) by mouth every evening.     Cholecalciferol 1.25 MG (50000 UT) TABS Take 1 tablet by mouth once a week. 4 tablet 1   clopidogrel (PLAVIX) 75 MG tablet Take 1 tablet (75 mg total) by mouth daily. 30 tablet 0   Elastic Bandages & Supports (MEDICAL COMPRESSION SOCKS) MISC 1 each by Does not apply route daily. 2 each 1   glipiZIDE (GLUCOTROL XL) 5 MG 24 hr tablet TAKE 1 TABLET BY MOUTH EVERY DAY WITH BREAKFAST 90 tablet 0   lisinopril (ZESTRIL) 5 MG tablet Take 1 tablet (5 mg total) by mouth daily. 90 tablet 3   MELATONIN GUMMIES PO Take 1 tablet by mouth at bedtime as needed.     metoprolol succinate (TOPROL-XL) 25 MG 24 hr tablet Take 1 tablet (25 mg total) by mouth daily. 90 tablet 3   pantoprazole (PROTONIX) 40 MG tablet Take 1  tablet (40 mg total) by mouth 2 (two) times daily. 60 tablet 0   No current facility-administered medications on file prior to visit.    Allergies  Allergen Reactions   Citalopram Other (See Comments)    Feels odd   Doxycycline Hyclate Nausea And Vomiting   Erythromycin Other (See Comments)    Irritated stomach   Montelukast Sodium Itching   Nitrofurantoin Nausea And Vomiting   Sulfa Antibiotics Nausea And Vomiting   Tramadol Hcl Other (See Comments)    Feels odd   Venlafaxine Hcl Other (See Comments)    Feels odd   Amoxicillin-Pot Clavulanate Nausea And Vomiting   Clarithromycin Rash     See ER note 02/2017    Past Medical History:  Diagnosis Date   Anxiety    Asthma    CVA (cerebral vascular accident) (HCC) 03/2006   right occipital, Dr. Thad Ranger   Diabetes mellitus    GERD (gastroesophageal reflux disease)    Hyperlipidemia    Hypertension     Past Surgical History:  Procedure Laterality Date   CARDIOVASCULAR STRESS TEST  1/15   myoview. EF 60%   CESAREAN SECTION     ESOPHAGOGASTRODUODENOSCOPY  05/2005   TONSILLECTOMY AND ADENOIDECTOMY      Family History  Problem Relation Age of Onset   Coronary artery  disease Mother    Diabetes Mellitus II Mother    Heart attack Mother 6   Diabetes Mellitus II Maternal Grandmother     Social History   Socioeconomic History   Marital status: Divorced    Spouse name: Not on file   Number of children: 2   Years of education: Not on file   Highest education level: Not on file  Occupational History   Occupation: LAB TECH    Employer: [employer]  Tobacco Use   Smoking status: Never Smoker   Smokeless tobacco: Never Used  Building services engineer Use: Never used  Substance and Sexual Activity   Alcohol use: No    Alcohol/week: 0.0 standard drinks    Comment: wine occassionally   Drug use: No   Sexual activity: Yes    Birth control/protection: Condom  Other Topics Concern   Not on  file  Social History Narrative   Not on file   Social Determinants of Health   Financial Resource Strain: Not on file  Food Insecurity: Not on file  Transportation Needs: Not on file  Physical Activity: Not on file  Stress: Not on file  Social Connections: Not on file  Intimate Partner Violence: Not on file   Review of Systems No ear pain Some sore throat    Objective:   Physical Exam Constitutional:      Appearance: Normal appearance.  Pulmonary:     Effort: Pulmonary effort is normal. No respiratory distress.  Neurological:     Mental Status: She is alert.            Assessment & Plan:

## 2020-02-05 NOTE — Assessment & Plan Note (Signed)
Has been sick for over 2 weeks Seems to be sinus Will try amoxil again (has tolerated) Discussed robitussin DM and tylenol

## 2020-02-05 NOTE — Telephone Encounter (Signed)
Spoke to pt. Set up appt for 1130 virtual.

## 2020-02-05 NOTE — Telephone Encounter (Signed)
I can do virtual at 11:30AM today if that works for her

## 2020-02-05 NOTE — Telephone Encounter (Signed)
Patient called in stating she is having a chest and head cold for almost 2 weeks. Very congested and cough. Wanting to come in office. Expressed virtual needed and wondering if she can be squeezed in today? Please advise.

## 2020-02-13 ENCOUNTER — Telehealth: Payer: Self-pay

## 2020-02-13 ENCOUNTER — Ambulatory Visit: Payer: 59 | Admitting: Internal Medicine

## 2020-02-13 NOTE — Telephone Encounter (Signed)
I can add her at 1:45 tomorrow if she can make it then

## 2020-02-13 NOTE — Telephone Encounter (Signed)
Unable to reach pt by phone sending note to [place] at front desk and [facility] CMA.

## 2020-02-13 NOTE — Telephone Encounter (Signed)
That is fairly typical. I have tried to call her multiple times in the past and I am unable to leave a VM. I feel like multiple attempts have been made to facilitate her needs. Hopefully if she needs [place] she will call back.

## 2020-02-13 NOTE — Telephone Encounter (Signed)
I made several attempts to reach patient, but she didn't answer and her voice mail is full.

## 2020-02-13 NOTE — Telephone Encounter (Signed)
[facility] Night - Client Nonclinical Telephone Record AccessNurse Client [facility] Night - Client Client Site  [facility] - Night Physician Tillman Abide- MD Contact Type Call Who Is Calling Patient / Member / Family / Caregiver Caller Name Latiesha Harada Caller Phone Number [phone] Patient Name Tami Jones Patient DOB 1955-05-21 Call Type Message Only Information Provided Reason for Call Request to Schedule Office Appointment Initial Comment Caller had virtual appt last week and was told if not been then an in person visit needs to be scheduled, Caller states she's not feeling better and needing to schedule an in person visit Disp. Time Disposition Final User 02/12/2020 5:07:45 PM General Information Provided Yes Tiburcio Pea, Lanette Call Closed By: Evette Doffing Transaction Date/Time: 02/12/2020 5:04:40 PM (ET)

## 2020-02-13 NOTE — Telephone Encounter (Signed)
I attempted to contact patient, but her voice mail was full.

## 2020-02-13 NOTE — Telephone Encounter (Signed)
I called patient and offered 2 appointments for today.  Patient didn't think that she could get a ride today.  Patient said she'll call back to schedule.

## 2020-02-14 NOTE — Telephone Encounter (Signed)
That spot is now filled. If she calls back, find out how she is doing

## 2020-02-25 ENCOUNTER — Other Ambulatory Visit: Payer: Self-pay | Admitting: Internal Medicine

## 2020-03-12 ENCOUNTER — Encounter: Payer: Self-pay | Admitting: Family Medicine

## 2020-03-12 ENCOUNTER — Other Ambulatory Visit: Payer: Self-pay

## 2020-03-12 ENCOUNTER — Encounter: Payer: 59 | Admitting: Family Medicine

## 2020-03-13 NOTE — Progress Notes (Signed)
This encounter was created in error - please disregard.

## 2020-07-31 ENCOUNTER — Other Ambulatory Visit: Payer: Self-pay | Admitting: Internal Medicine

## 2020-08-25 ENCOUNTER — Telehealth: Payer: Self-pay

## 2020-08-25 ENCOUNTER — Ambulatory Visit (INDEPENDENT_AMBULATORY_CARE_PROVIDER_SITE_OTHER): Payer: 59 | Admitting: Internal Medicine

## 2020-08-25 ENCOUNTER — Other Ambulatory Visit: Payer: Self-pay

## 2020-08-25 ENCOUNTER — Encounter: Payer: Self-pay | Admitting: Internal Medicine

## 2020-08-25 VITALS — BP 132/90 | HR 103 | Temp 97.3°F | Wt 240.0 lb

## 2020-08-25 DIAGNOSIS — R300 Dysuria: Secondary | ICD-10-CM

## 2020-08-25 DIAGNOSIS — N3000 Acute cystitis without hematuria: Secondary | ICD-10-CM | POA: Diagnosis not present

## 2020-08-25 LAB — POC URINALSYSI DIPSTICK (AUTOMATED)
Bilirubin, UA: NEGATIVE
Blood, UA: NEGATIVE
Glucose, UA: NEGATIVE
Ketones, UA: NEGATIVE
Leukocytes, UA: NEGATIVE
Nitrite, UA: NEGATIVE
Protein, UA: POSITIVE — AB
Spec Grav, UA: >=1.03 — AB (ref 1.010–1.025)
Urobilinogen, UA: 0.2 E.U./dL
pH, UA: 5.5 (ref 5.0–8.0)

## 2020-08-25 MED ORDER — CEPHALEXIN 500 MG PO CAPS: 500.0000 mg | ORAL_CAPSULE | Freq: Three times a day (TID) | ORAL | 1 refills | Status: DC

## 2020-08-25 NOTE — Assessment & Plan Note (Signed)
Dysuria and pain along left side Urinalysis shows no nitrites or leukocytes----so not consistent with UTI or pyelo No blood--so doubt stone---but if pain persists, would plan to check non contrast CT scan Will try cephalexin

## 2020-08-25 NOTE — Telephone Encounter (Signed)
Yes - I can see her today

## 2020-08-25 NOTE — Telephone Encounter (Signed)
Patient called stating that she has been having back pain, pain with urination, urgency with urination, nausea and her b/p has been up and down around 170/101 and b/p does get better after taking her medication. No chest pain or SOB. Patient is not in distress. Started on 08/22/20. Patient has been drinking more water and cranberry juice. No fever.   Patient is scheduled with Dr Para March for tomorrow because we do not have any openings today at this location and patient did not want to go to another [place] office.  Patient could come in after 1:30 pm if able to be worked in.

## 2020-08-25 NOTE — Progress Notes (Signed)
Subjective:    Patient ID: Tami Jones, female    DOB: 08/25/1955, 65 y.o.   MRN: [id number]  HPI Here due to dysuria This visit occurred during the SARS-CoV-2 public health emergency.  Safety protocols were in place, including screening questions prior to the visit, additional usage of staff PPE, and extensive cleaning of exam room while observing appropriate contact time as indicated for disinfecting solutions.   Having some dysuria, left groin pain--that radiates to left low back Started about 2 days ago Pain bad at times No history of kidney stones  No fever Took some tylenol--no clear help Heating pad helps back  Current Outpatient Medications on File Prior to Visit  Medication Sig Dispense Refill   acetaminophen (TYLENOL) 325 MG tablet Take 650 mg by mouth every 6 (six) hours as needed.     ALPRAZolam (XANAX) 0.25 MG tablet Take 1 tablet (0.25 mg total) by mouth 3 (three) times daily as needed for anxiety or sleep. 30 tablet 0   amLODipine (NORVASC) 10 MG tablet Take 1 tablet (10 mg total) by mouth every evening. 90 tablet 3   aspirin EC 81 MG tablet Take 81 mg by mouth every evening.      atorvastatin (LIPITOR) 80 MG tablet Take 1 tablet (80 mg total) by mouth every evening.     Cholecalciferol 1.25 MG (50000 UT) TABS Take 1 tablet by mouth once a week. 4 tablet 1   clopidogrel (PLAVIX) 75 MG tablet Take 1 tablet (75 mg total) by mouth daily. 30 tablet 0   Elastic Bandages & Supports (MEDICAL COMPRESSION SOCKS) MISC 1 each by Does not apply route daily. 2 each 1   glipiZIDE (GLUCOTROL XL) 5 MG 24 hr tablet TAKE 1 TABLET BY MOUTH EVERY DAY WITH BREAKFAST 90 tablet 3   MELATONIN GUMMIES PO Take 1 tablet by mouth at bedtime as needed.     metoprolol succinate (TOPROL-XL) 25 MG 24 hr tablet Take 1 tablet (25 mg total) by mouth daily. 90 tablet 0   pantoprazole (PROTONIX) 40 MG tablet Take 1 tablet (40 mg total) by mouth 2 (two) times daily. (Patient taking differently: Take 40  mg by mouth daily.) 60 tablet 0   No current facility-administered medications on file prior to visit.    Allergies  Allergen Reactions   Citalopram Other (See Comments)    Feels odd   Doxycycline Hyclate Nausea And Vomiting   Erythromycin Other (See Comments)    Irritated stomach   Montelukast Sodium Itching   Nitrofurantoin Nausea And Vomiting   Sulfa Antibiotics Nausea And Vomiting   Tramadol Hcl Other (See Comments)    Feels odd   Venlafaxine Hcl Other (See Comments)    Feels odd   Amoxicillin-Pot Clavulanate Nausea And Vomiting   Clarithromycin Rash     See ER note 02/2017    Past Medical History:  Diagnosis Date   Anxiety    Asthma    CVA (cerebral vascular accident) (HCC) 03/2006   right occipital, Dr. Thad Ranger   Diabetes mellitus    GERD (gastroesophageal reflux disease)    Hyperlipidemia    Hypertension     Past Surgical History:  Procedure Laterality Date   CARDIOVASCULAR STRESS TEST  1/15   myoview. EF 60%   CESAREAN SECTION     ESOPHAGOGASTRODUODENOSCOPY  05/2005   TONSILLECTOMY AND ADENOIDECTOMY      Family History  Problem Relation Age of Onset   Coronary artery disease Mother    Diabetes Mellitus  II Mother    Heart attack Mother 75   Diabetes Mellitus II Maternal Grandmother     Social History   Socioeconomic History   Marital status: Divorced    Spouse name: Not on file   Number of children: 2   Years of education: Not on file   Highest education level: Not on file  Occupational History   Occupation: LAB TECH    Employer: [employer]  Tobacco Use   Smoking status: Never   Smokeless tobacco: Never  Vaping Use   Vaping Use: Never used  Substance and Sexual Activity   Alcohol use: No    Alcohol/week: 0.0 standard drinks    Comment: wine occassionally   Drug use: No   Sexual activity: Yes    Birth control/protection: Condom  Other Topics Concern   Not on file  Social History Narrative   Not on file   Social Determinants of Health    Financial Resource Strain: Not on file  Food Insecurity: Not on file  Transportation Needs: Not on file  Physical Activity: Not on file  Stress: Not on file  Social Connections: Not on file  Intimate Partner Violence: Not on file   Review of Systems Some nausea yesterday----has this in general at times Is able to eat BP up some evenings ---like 155/85 (and even higher). May be mostly related to anxiety and xanax seem to help it    Objective:   Physical Exam Constitutional:      Appearance: Normal appearance.  Abdominal:     Palpations: Abdomen is soft.     Comments: Mild LLQ tenderness > suprapubic Pain noted in left flank but not CVA area  Neurological:     Mental Status: She is alert.           Assessment & Plan:

## 2020-08-25 NOTE — Telephone Encounter (Signed)
Spoke to pt to make sure we were not having several different issues. She said the back pain started with the dysuria. Thinks BP is up due to pain. Canceled her appt with Dr Para March tomorrow and made appt with Dr Alphonsus Sias today at 145.

## 2020-08-26 ENCOUNTER — Ambulatory Visit: Payer: 59 | Admitting: Family Medicine

## 2020-11-10 ENCOUNTER — Encounter: Payer: Self-pay | Admitting: Internal Medicine

## 2020-11-10 ENCOUNTER — Ambulatory Visit (INDEPENDENT_AMBULATORY_CARE_PROVIDER_SITE_OTHER): Payer: 59 | Admitting: Internal Medicine

## 2020-11-10 ENCOUNTER — Other Ambulatory Visit: Payer: Self-pay

## 2020-11-10 VITALS — BP 140/86 | HR 79 | Temp 97.8°F | Ht 64.0 in | Wt 246.0 lb

## 2020-11-10 DIAGNOSIS — R0781 Pleurodynia: Secondary | ICD-10-CM | POA: Diagnosis not present

## 2020-11-10 MED ORDER — HYDROCODONE-ACETAMINOPHEN 5-325 MG PO TABS: 0.5000 | ORAL_TABLET | Freq: Four times a day (QID) | ORAL | 0 refills | Status: DC | PRN

## 2020-11-10 NOTE — Assessment & Plan Note (Signed)
No injury  Pleuritic but doesn't seem to have pulmonary process Heat no help Will check x-rays--has to be tomorrow morning

## 2020-11-10 NOTE — Progress Notes (Signed)
Subjective:    Patient ID: Tami Jones, female    DOB: 24-Sep-1955, 65 y.o.   MRN: [id number]  HPI Here due to back and flank pain This visit occurred during the SARS-CoV-2 public health emergency.  Safety protocols were in place, including screening questions prior to the visit, additional usage of staff PPE, and extensive cleaning of exam room while observing appropriate contact time as indicated for disinfecting solutions.   Having left upper back pain (lower thoracic) that now radiates to chest and sternum Doesn't move to right side Some arm pain as well Started a week ago Sore substernal Worse with lying down---night is the worst--hard to lie on either side No fall or injury Does hurt with cough or breathing Achy dull pain---at times can be sharp No rash  Both feet have burning sensation--also new  Current Outpatient Medications on File Prior to Visit  Medication Sig Dispense Refill   acetaminophen (TYLENOL) 325 MG tablet Take 650 mg by mouth every 6 (six) hours as needed.     ALPRAZolam (XANAX) 0.25 MG tablet Take 1 tablet (0.25 mg total) by mouth 3 (three) times daily as needed for anxiety or sleep. 30 tablet 0   amLODipine (NORVASC) 10 MG tablet Take 1 tablet (10 mg total) by mouth every evening. 90 tablet 3   aspirin EC 81 MG tablet Take 81 mg by mouth every evening.      clopidogrel (PLAVIX) 75 MG tablet Take 1 tablet (75 mg total) by mouth daily. 30 tablet 0   glipiZIDE (GLUCOTROL XL) 5 MG 24 hr tablet TAKE 1 TABLET BY MOUTH EVERY DAY WITH BREAKFAST 90 tablet 3   MELATONIN GUMMIES PO Take 1 tablet by mouth at bedtime as needed.     metoprolol succinate (TOPROL-XL) 25 MG 24 hr tablet Take 1 tablet (25 mg total) by mouth daily. 90 tablet 0   pantoprazole (PROTONIX) 40 MG tablet Take 1 tablet (40 mg total) by mouth 2 (two) times daily. (Patient taking differently: Take 40 mg by mouth daily.) 60 tablet 0   atorvastatin (LIPITOR) 80 MG tablet Take 1 tablet (80 mg total) by  mouth every evening.     Elastic Bandages & Supports (MEDICAL COMPRESSION SOCKS) MISC 1 each by Does not apply route daily. (Patient not taking: Reported on 11/10/2020) 2 each 1   No current facility-administered medications on file prior to visit.    Allergies  Allergen Reactions   Citalopram Other (See Comments)    Feels odd   Doxycycline Hyclate Nausea And Vomiting   Erythromycin Other (See Comments)    Irritated stomach   Montelukast Sodium Itching   Nitrofurantoin Nausea And Vomiting   Sulfa Antibiotics Nausea And Vomiting   Tramadol Hcl Other (See Comments)    Feels odd   Venlafaxine Hcl Other (See Comments)    Feels odd   Amoxicillin-Pot Clavulanate Nausea And Vomiting   Clarithromycin Rash     See ER note 02/2017    Past Medical History:  Diagnosis Date   Anxiety    Asthma    CVA (cerebral vascular accident) (HCC) 03/2006   right occipital, Dr. Thad Ranger   Diabetes mellitus    GERD (gastroesophageal reflux disease)    Hyperlipidemia    Hypertension     Past Surgical History:  Procedure Laterality Date   CARDIOVASCULAR STRESS TEST  1/15   myoview. EF 60%   CESAREAN SECTION     ESOPHAGOGASTRODUODENOSCOPY  05/2005   TONSILLECTOMY AND ADENOIDECTOMY  Family History  Problem Relation Age of Onset   Coronary artery disease Mother    Diabetes Mellitus II Mother    Heart attack Mother 36   Diabetes Mellitus II Maternal Grandmother     Social History   Socioeconomic History   Marital status: Divorced    Spouse name: Not on file   Number of children: 2   Years of education: Not on file   Highest education level: Not on file  Occupational History   Occupation: LAB TECH    Employer: [employer]  Tobacco Use   Smoking status: Never   Smokeless tobacco: Never  Vaping Use   Vaping Use: Never used  Substance and Sexual Activity   Alcohol use: No    Alcohol/week: 0.0 standard drinks    Comment: wine occassionally   Drug use: No   Sexual activity: Yes     Birth control/protection: Condom  Other Topics Concern   Not on file  Social History Narrative   Not on file   Social Determinants of Health   Financial Resource Strain: Not on file  Food Insecurity: Not on file  Transportation Needs: Not on file  Physical Activity: Not on file  Stress: Not on file  Social Connections: Not on file  Intimate Partner Violence: Not on file   Review of Systems No fever Slight cough---nothing really new or bad Breathing a bit heavy---no difference Gets really tired easy--since the stroke    Objective:   Physical Exam Constitutional:      Appearance: Normal appearance.  Cardiovascular:     Rate and Rhythm: Normal rate and regular rhythm.     Heart sounds: No murmur heard.   No gallop.  Pulmonary:     Effort: Pulmonary effort is normal.     Breath sounds: Normal breath sounds. No wheezing or rales.     Comments: Significant tenderness along right lower thoracic ribs along mid axillary line Some tenderness into sternum as well Neurological:     Mental Status: She is alert.           Assessment & Plan:

## 2020-11-11 ENCOUNTER — Other Ambulatory Visit: Payer: 59

## 2020-11-11 ENCOUNTER — Ambulatory Visit (INDEPENDENT_AMBULATORY_CARE_PROVIDER_SITE_OTHER): Payer: 59

## 2020-11-11 ENCOUNTER — Other Ambulatory Visit: Payer: Self-pay

## 2020-11-11 DIAGNOSIS — R0781 Pleurodynia: Secondary | ICD-10-CM

## 2020-11-12 ENCOUNTER — Telehealth: Payer: Self-pay | Admitting: Internal Medicine

## 2020-11-12 NOTE — Telephone Encounter (Signed)
Pt returning your call...[phone]

## 2020-11-13 NOTE — Telephone Encounter (Signed)
Spoke to pt about xray. She will call back next week if no improvement

## 2020-12-12 ENCOUNTER — Other Ambulatory Visit: Payer: Self-pay | Admitting: Internal Medicine

## 2020-12-12 ENCOUNTER — Encounter: Payer: Self-pay | Admitting: Internal Medicine

## 2020-12-12 ENCOUNTER — Other Ambulatory Visit: Payer: Self-pay

## 2020-12-12 ENCOUNTER — Telehealth (INDEPENDENT_AMBULATORY_CARE_PROVIDER_SITE_OTHER): Payer: 59 | Admitting: Internal Medicine

## 2020-12-12 DIAGNOSIS — J0110 Acute frontal sinusitis, unspecified: Secondary | ICD-10-CM

## 2020-12-12 MED ORDER — CEFUROXIME AXETIL 250 MG PO TABS: 250.0000 mg | ORAL_TABLET | Freq: Two times a day (BID) | ORAL | 1 refills | Status: AC

## 2020-12-12 NOTE — Assessment & Plan Note (Signed)
Has 4 days of symptoms---seems viral Unclear if she might have secondary bacterial infection Discussed continuing support care If worsens in the next couple of days--will start ceftin 250 bid (5 days)

## 2020-12-12 NOTE — Progress Notes (Signed)
Subjective:    Patient ID: Tami Jones, female    DOB: 10-16-1955, 65 y.o.   MRN: [id number]  HPI Video virtual visit due to respiratory illness Identification done Reviewed limitations and billing and she gave consent Participants---patient in her home and I am in my office  Has had a cold for several days--4 days ago Coughing up yellow stuff--gets pain in chest with cough at times Also throwing up--due to drainage Intermittent fever Now feels chest is hurting---throat sore Will have some SOB feeling---nothing persistent Some frontal headache Some pain in left ear  Tylenol ---helps a little  Current Outpatient Medications on File Prior to Visit  Medication Sig Dispense Refill  . acetaminophen (TYLENOL) 325 MG tablet Take 650 mg by mouth every 6 (six) hours as needed.    . ALPRAZolam (XANAX) 0.25 MG tablet Take 1 tablet (0.25 mg total) by mouth 3 (three) times daily as needed for anxiety or sleep. 30 tablet 0  . amLODipine (NORVASC) 10 MG tablet Take 1 tablet (10 mg total) by mouth every evening. 90 tablet 3  . aspirin EC 81 MG tablet Take 81 mg by mouth every evening.     . clopidogrel (PLAVIX) 75 MG tablet Take 1 tablet (75 mg total) by mouth daily. 30 tablet 0  . Elastic Bandages & Supports (MEDICAL COMPRESSION SOCKS) MISC 1 each by Does not apply route daily. 2 each 1  . glipiZIDE (GLUCOTROL XL) 5 MG 24 hr tablet TAKE 1 TABLET BY MOUTH EVERY DAY WITH BREAKFAST 90 tablet 3  . HYDROcodone-acetaminophen (NORCO/VICODIN) 5-325 MG tablet Take 0.5-1 tablets by mouth every 6 (six) hours as needed for moderate pain. 20 tablet 0  . MELATONIN GUMMIES PO Take 1 tablet by mouth at bedtime as needed.    . metoprolol succinate (TOPROL-XL) 25 MG 24 hr tablet Take 1 tablet (25 mg total) by mouth daily. 90 tablet 0  . pantoprazole (PROTONIX) 40 MG tablet Take 1 tablet (40 mg total) by mouth 2 (two) times daily. (Patient taking differently: Take 40 mg by mouth daily.) 60 tablet 0  .  atorvastatin (LIPITOR) 80 MG tablet Take 1 tablet (80 mg total) by mouth every evening.     No current facility-administered medications on file prior to visit.    Allergies  Allergen Reactions  . Citalopram Other (See Comments)    Feels odd  . Doxycycline Hyclate Nausea And Vomiting  . Erythromycin Other (See Comments)    Irritated stomach  . Montelukast Sodium Itching  . Nitrofurantoin Nausea And Vomiting  . Sulfa Antibiotics Nausea And Vomiting  . Tramadol Hcl Other (See Comments)    Feels odd  . Venlafaxine Hcl Other (See Comments)    Feels odd  . Amoxicillin-Pot Clavulanate Nausea And Vomiting  . Clarithromycin Rash     See ER note 02/2017    Past Medical History:  Diagnosis Date  . Anxiety   . Asthma   . 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 Thad Ranger  . Diabetes mellitus   . GERD (gastroesophageal reflux disease)   . Hyperlipidemia   . Hypertension     Past Surgical History:  Procedure Laterality Date  . CARDIOVASCULAR STRESS TEST  1/15   myoview. EF 60%  . CESAREAN SECTION    . ESOPHAGOGASTRODUODENOSCOPY  05/2005  . TONSILLECTOMY AND ADENOIDECTOMY      Family History  Problem Relation Age of Onset  . Coronary artery disease Mother   . Diabetes Mellitus II  Mother   . Heart attack Mother 15  . Diabetes Mellitus II Maternal Grandmother     Social History   Socioeconomic History  . Marital status: Divorced    Spouse name: Not on file  . Number of children: 2  . Years of education: Not on file  . Highest education level: Not on file  Occupational History  . Occupation: LAB TECH    Employer: [employer]  Tobacco Use  . Smoking status: Never  . Smokeless tobacco: Never  Vaping Use  . Vaping Use: Never used  Substance and Sexual Activity  . Alcohol use: No    Alcohol/week: 0.0 standard drinks    Comment: wine occassionally  . Drug use: No  . Sexual activity: Yes    Birth control/protection: Condom  Other Topics  Concern  . Not on file  Social History Narrative  . Not on file   Social Determinants of Health   Financial Resource Strain: Not on file  Food Insecurity: Not on file  Transportation Needs: Not on file  Physical Activity: Not on file  Stress: Not on file  Social Connections: Not on file  Intimate Partner Violence: Not on file   Review of Systems Appetite is off--not eating well Trouble sleeping--up for bathroom BP running up some    Objective:   Physical Exam Constitutional:      Appearance: Normal appearance.  Pulmonary:     Effort: Pulmonary effort is normal. No respiratory distress.  Neurological:     Mental Status: She is alert.           Assessment & Plan:

## 2020-12-18 ENCOUNTER — Telehealth: Payer: Self-pay | Admitting: Internal Medicine

## 2020-12-18 ENCOUNTER — Emergency Department
Admission: EM | Admit: 2020-12-18 | Discharge: 2020-12-18 | Disposition: A | Discharge status: Home / Self Care | Payer: 59 | Attending: Emergency Medicine | Admitting: Emergency Medicine

## 2020-12-18 ENCOUNTER — Emergency Department: Payer: 59

## 2020-12-18 ENCOUNTER — Other Ambulatory Visit: Payer: Self-pay

## 2020-12-18 DIAGNOSIS — I129 Hypertensive chronic kidney disease with stage 1 through stage 4 chronic kidney disease, or unspecified chronic kidney disease: Secondary | ICD-10-CM | POA: Diagnosis not present

## 2020-12-18 DIAGNOSIS — J40 Bronchitis, not specified as acute or chronic: Secondary | ICD-10-CM | POA: Insufficient documentation

## 2020-12-18 DIAGNOSIS — Z7902 Long term (current) use of antithrombotics/antiplatelets: Secondary | ICD-10-CM | POA: Diagnosis not present

## 2020-12-18 DIAGNOSIS — R0602 Shortness of breath: Secondary | ICD-10-CM | POA: Diagnosis present

## 2020-12-18 DIAGNOSIS — Z7984 Long term (current) use of oral hypoglycemic drugs: Secondary | ICD-10-CM | POA: Diagnosis not present

## 2020-12-18 DIAGNOSIS — Z79899 Other long term (current) drug therapy: Secondary | ICD-10-CM | POA: Insufficient documentation

## 2020-12-18 DIAGNOSIS — N1831 Chronic kidney disease, stage 3a: Secondary | ICD-10-CM | POA: Diagnosis not present

## 2020-12-18 DIAGNOSIS — E1122 Type 2 diabetes mellitus with diabetic chronic kidney disease: Secondary | ICD-10-CM | POA: Diagnosis not present

## 2020-12-18 DIAGNOSIS — Z7982 Long term (current) use of aspirin: Secondary | ICD-10-CM | POA: Diagnosis not present

## 2020-12-18 LAB — CBC
HCT: 42.4 % (ref 36.0–46.0)
Hemoglobin: 14.2 g/dL (ref 12.0–15.0)
MCH: 30.9 pg (ref 26.0–34.0)
MCHC: 33.5 g/dL (ref 30.0–36.0)
MCV: 92.2 fL (ref 80.0–100.0)
Platelets: 243 10*3/uL (ref 150–400)
RBC: 4.6 MIL/uL (ref 3.87–5.11)
RDW: 12.3 % (ref 11.5–15.5)
WBC: 9.5 10*3/uL (ref 4.0–10.5)
nRBC: 0 % (ref 0.0–0.2)

## 2020-12-18 LAB — BASIC METABOLIC PANEL
Anion gap: 9 (ref 5–15)
BUN: 12 mg/dL (ref 8–23)
CO2: 27 mmol/L (ref 22–32)
Calcium: 9 mg/dL (ref 8.9–10.3)
Chloride: 102 mmol/L (ref 98–111)
Creatinine, Ser: 1.14 mg/dL — ABNORMAL HIGH (ref 0.44–1.00)
GFR, Estimated: 54 mL/min — ABNORMAL LOW (ref >60)
Glucose, Bld: 174 mg/dL — ABNORMAL HIGH (ref 70–99)
Potassium: 3.9 mmol/L (ref 3.5–5.1)
Sodium: 138 mmol/L (ref 135–145)

## 2020-12-18 LAB — TROPONIN I (HIGH SENSITIVITY)
Troponin I (High Sensitivity): 10 ng/L (ref <18)
Troponin I (High Sensitivity): 11 ng/L (ref <18)

## 2020-12-18 LAB — D-DIMER, QUANTITATIVE: D-Dimer, Quant: 0.62 ug/mL-FEU — ABNORMAL HIGH (ref 0.00–0.50)

## 2020-12-18 MED ORDER — IPRATROPIUM-ALBUTEROL 0.5-2.5 (3) MG/3ML IN SOLN
3.0000 mL | Freq: Once | RESPIRATORY_TRACT | Status: AC
  Administered 2020-12-18: 3 mL via RESPIRATORY_TRACT
  Filled 2020-12-18: qty 3

## 2020-12-18 MED ORDER — IOHEXOL 350 MG/ML SOLN
75.0000 mL | Freq: Once | INTRAVENOUS | Status: AC | PRN
  Administered 2020-12-18: 75 mL via INTRAVENOUS
  Filled 2020-12-18: qty 75

## 2020-12-18 MED ORDER — PREDNISONE 20 MG PO TABS
60.0000 mg | ORAL_TABLET | Freq: Once | ORAL | Status: DC
  Filled 2020-12-18: qty 3

## 2020-12-18 MED ORDER — ALBUTEROL SULFATE HFA 108 (90 BASE) MCG/ACT IN AERS: 2.0000 | INHALATION_SPRAY | Freq: Four times a day (QID) | RESPIRATORY_TRACT | 2 refills | Status: DC | PRN

## 2020-12-18 MED ORDER — BENZONATATE 100 MG PO CAPS: 100.0000 mg | ORAL_CAPSULE | Freq: Four times a day (QID) | ORAL | 0 refills | Status: DC | PRN

## 2020-12-18 NOTE — Telephone Encounter (Signed)
Per chart review tab pt is presently at [facility] ED. 

## 2020-12-18 NOTE — Discharge Instructions (Signed)
Please seek medical attention for any high fevers, chest pain, shortness of breath, change in behavior, persistent vomiting, bloody stool or any other new or concerning symptoms.  

## 2020-12-18 NOTE — Telephone Encounter (Signed)
[name] - Client TELEPHONE ADVICE RECORD [facility] Patient Name: Tami Jones Gender: Female DOB: 27-Sep-1955 Age: 65 Y 11 M 29 D Return Phone Number: [phone] (Primary) Address: City/ State/ Zip: [place] [address] Client [facility] - Client Client Site [facility] - Day Physician Tillman Abide- MD Contact Type Call Who Is Calling Patient / Member / Family / Caregiver Call Type Triage / Clinical Relationship To Patient Self Return Phone Number [phone] (Primary) Chief Complaint CHEST PAIN - pain, pressure, heaviness or tightness Reason for Call Symptomatic / Request for Health Information Initial Comment Caller states she finished antibiotic and is feeling worse. She is coughing and having chest pain. Translation No Nurse Assessment Nurse: Suezanne Jacquet, RN, Riley Lam Date/Time (Eastern Time): 12/18/2020 9:34:47 AM Confirm and document reason for call. If symptomatic, describe symptoms. ---Caller states she finished antibiotic and is feeling worse. She is coughing and having chest pain. Coughing continually on the phone and pain in chest through the back. Began a couple days ago. Does the patient have any new or worsening symptoms? ---Yes Will a triage be completed? ---Yes Related visit to physician within the last 2 weeks? ---Yes Does the PT have any chronic conditions? (i.e. diabetes, asthma, this includes High risk factors for pregnancy, etc.) ---Yes List chronic conditions. ---diabetes Is this a behavioral health or substance abuse call? ---No Guidelines Guideline Title Affirmed Question Affirmed Notes Nurse Date/Time Lamount Cohen Time) Chest Pain [1] Chest pain lasts > 5 minutes AND [2] age > 65 Suezanne Jacquet, RN, Riley Lam 12/18/2020 9:36:37 AM Disp. Time Lamount Cohen Time) Disposition Final User 12/18/2020 9:34:09 AM Send to Urgent Queue Bjorn Loser 12/18/2020 9:47:44 AM 911 Outcome  Documentation Suezanne Jacquet, RN, Riley Lam PLEASE NOTE: All timestamps contained within this report are represented as [place] Standard Time. CONFIDENTIALTY NOTICE: This fax transmission is intended only for the addressee. It contains information that is legally privileged, confidential or otherwise protected from use or disclosure. If you are not the intended recipient, you are strictly prohibited from reviewing, disclosing, copying using or disseminating any of this information or taking any action in reliance on or regarding this information. If you have received this fax in error, please notify us immediately by telephone so that we can arrange for its return to [place]. Phone: [phone], Toll-Free: [phone], Fax: [phone] Page: 2 of 2 Call Id: [id number] Disp. Time Lamount Cohen Time) Disposition Final User Reason: No answer to the call back. 12/18/2020 9:39:04 AM Call EMS 911 Now Yes Suezanne Jacquet, RN, [place] Spaniel Disagree/Comply Comply Caller Understands Yes PreDisposition Home Care Care Advice Given Per Guideline CALL EMS 911 NOW: CARE ADVICE given per Chest Pain (Adult) guideline. * Available: As either 81 mg (taking two = 162 mg), 162 mg, or 325 mg tablets. * Dosage: 162 mg to 325 mg, my mouth, chewed and swallowed. Comments User: Cameron Proud, RN Date/Time Lamount Cohen Time): 12/18/2020 9:41:33 AM coughing and states chest hurts with coughing and without. Chest pain regardless of coughing. I could have used a covid guideline but determined due to age and diabetes with chest pain even without coughing I picked Chest pain guideline to go by. Very short of breath just trying to talk on the phone an speaking in short sentences even though she denies that severity of sob. Referrals [facility] - E

## 2020-12-18 NOTE — Telephone Encounter (Signed)
Has only been triaged so far. Will await ER disposition (this is similar to past ER visits) and then decide on follow up plans

## 2020-12-18 NOTE — Telephone Encounter (Signed)
Pt called in stating that provider told her to call back if she is not feeling any better. Had virtual visit with provider Chest hurting, coughing a lot, wheezing and body aches

## 2020-12-18 NOTE — Telephone Encounter (Signed)
Will send note to Dr Alphonsus Sias who is out of office, Dr Selena Batten who ios in office and [facility] CMA.will also teams [facility]

## 2020-12-18 NOTE — ED Triage Notes (Signed)
Pt to ER via POV with complaints of shortness of breath and worsening congestion over the last three weeks. Reports shortness of breath is worse with exertion. Dry cough. Report some chest heaviness.   Reports she just finished taking an antibiotic that she was prescribed via a telehealth visit that did not improve her symptoms. Unknown covid/ flu exposure.   Pt hypertensive in triage, reports she has been unable to take her BP meds due to lack of appetite.

## 2020-12-18 NOTE — Telephone Encounter (Signed)
Tried to call pt back. No answer. VM is full. Did she have any improvement in symptoms from the antibiotic? She did not have body aches and chills on 10-28.

## 2020-12-18 NOTE — ED Notes (Signed)
Pt in rad 

## 2020-12-18 NOTE — ED Provider Notes (Signed)
[facility] Emergency Department Provider Note  ____________________________________________   I have reviewed the triage vital signs and the nursing notes.   HISTORY  Chief Complaint Shortness of Breath   History limited by: Not Limited   HPI Tami Jones is a 65 y.o. female who presents to the emergency department today because of concerns for continued shortness of breath and cough.  The patient states her symptoms started roughly 3 weeks ago.  She did have a televisit with her doctor and was prescribed a 5-day course of antibiotics.  She stated she did start to feel slightly better however it has gotten worse again.  Accompanying the shortness of breath is a nonbloody cough.  She has also had some discomfort in her chest.  Patient denies any history of lung disease. Took a home covid test which was negative.    Records reviewed. Per medical record review patient has a history of DM, HLD, HTN. Recent televisit with PCP who prescribed antibiotics for URI type symptoms.   Past Medical History:  Diagnosis Date   Anxiety    Asthma    CVA (cerebral vascular accident) (HCC) 03/2006   right occipital, Dr. Thad Ranger   Diabetes mellitus    GERD (gastroesophageal reflux disease)    Hyperlipidemia    Hypertension     Patient Active Problem List   Diagnosis Date Noted   Rib pain on left side 11/10/2020   Acute cystitis without hematuria 08/25/2020   Body aches 11/27/2019   Right ankle swelling 11/27/2019   Urinary frequency 11/27/2019   Nausea & vomiting 08/26/2019   History of CVA with residual deficit 08/25/2019   Acute metabolic encephalopathy    Intractable headache    Nausea    Anxiety    Hypertensive emergency, no CHF 08/01/2019   Elevated troponin 08/01/2019   Hypertension    HLD (hyperlipidemia)    Type 2 diabetes mellitus with hyperlipidemia (HCC)    CKD (chronic kidney disease), stage IIIa    Acute CVA (cerebrovascular accident) (HCC)     Depression    Gross hematuria 01/15/2019   Dysuria 01/15/2019   Right shoulder pain 12/04/2018   Neuropathic pain, arm 10/26/2018   MDD (major depressive disorder), single episode, moderate (HCC) 09/21/2018   Hemiparesis of right dominant side (HCC) 08/17/2018   Acute non-recurrent frontal sinusitis 07/27/2017   SOB (shortness of breath) 03/08/2017   Vestibular dizziness 01/21/2016   Weakness 09/22/2015   Tick bite 07/15/2015   Acute kidney injury superimposed on CKD (HCC) 07/17/2014   Cerebrovascular disease 07/17/2014   Preventative health care 06/25/2014   Right sided sciatica 03/08/2013   Chest pain 01/24/2012   MENOPAUSAL SYNDROME 09/10/2008   Mood disorder (HCC) 09/15/2007   Essential hypertension, benign 08/10/2006   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 EF 60%   CESAREAN SECTION     ESOPHAGOGASTRODUODENOSCOPY  05/2005   TONSILLECTOMY AND ADENOIDECTOMY      Prior to Admission medications   Medication Sig Start Date End Date Taking? Authorizing Provider  acetaminophen (TYLENOL) 325 MG tablet Take 650 mg by mouth every 6 (six) hours as needed.    [provider]  ALPRAZolam Prudy Feeler) 0.25 MG tablet Take 1 tablet (0.25 mg total) by mouth 3 (three) times daily as needed for anxiety or sleep. 10/16/19  Venia Carbon, MD  amLODipine (NORVASC) 10 MG tablet Take 1 tablet (10 mg total) by mouth every evening. 08/28/19   Loletha Grayer, MD  aspirin EC 81 MG tablet Take 81 mg by mouth every evening.     [provider]  atorvastatin (LIPITOR) 80 MG tablet Take 1 tablet (80 mg total) by mouth every evening. 08/28/19 08/27/20  Loletha Grayer, MD  cefUROXime (CEFTIN) 250 MG tablet Take 1 tablet (250 mg total) by mouth 2 (two) times daily with a meal for 10 days. 12/12/20  12/22/20  Venia Carbon, MD  clopidogrel (PLAVIX) 75 MG tablet Take 1 tablet (75 mg total) by mouth daily. 08/29/19   Loletha Grayer, MD  Elastic Bandages & Supports (MEDICAL COMPRESSION SOCKS) MISC 1 each by Does not apply route daily. 12/20/19   Merlyn Lot, MD  glipiZIDE (GLUCOTROL XL) 5 MG 24 hr tablet TAKE 1 TABLET BY MOUTH EVERY DAY WITH BREAKFAST 02/25/20   Venia Carbon, MD  HYDROcodone-acetaminophen (NORCO/VICODIN) 5-325 MG tablet Take 0.5-1 tablets by mouth every 6 (six) hours as needed for moderate pain. 11/10/20   Venia Carbon, MD  MELATONIN GUMMIES PO Take 1 tablet by mouth at bedtime as needed.    [provider]  metoprolol succinate (TOPROL-XL) 25 MG 24 hr tablet TAKE 1 TABLET(25 MG) BY MOUTH DAILY 12/12/20   Venia Carbon, MD  pantoprazole (PROTONIX) 40 MG tablet Take 1 tablet (40 mg total) by mouth 2 (two) times daily. Patient taking differently: Take 40 mg by mouth daily. 08/28/19   Loletha Grayer, MD    Allergies Citalopram, Doxycycline hyclate, Erythromycin, Montelukast sodium, Nitrofurantoin, Sulfa antibiotics, Tramadol hcl, Venlafaxine hcl, Amoxicillin-pot clavulanate, and Clarithromycin  Family History  Problem Relation Age of Onset   Coronary artery disease Mother    Diabetes Mellitus II Mother    Heart attack Mother 50   Diabetes Mellitus II Maternal Grandmother     Social History Social History   Tobacco Use   Smoking status: Never   Smokeless tobacco: Never  Vaping Use   Vaping Use: Never used  Substance Use Topics   Alcohol use: No    Alcohol/week: 0.0 standard drinks    Comment: wine occassionally   Drug use: No    Review of Systems Constitutional: No fever/chills Eyes: No visual changes. ENT: No sore throat. Cardiovascular: Positive for chest pain. Respiratory: Positive for cough and shortness of breath. Gastrointestinal: No abdominal pain.  No nausea, no vomiting.  No diarrhea.   Genitourinary: Negative for  dysuria. Musculoskeletal: Negative for back pain. Skin: Negative for rash. Neurological: Negative for headaches, focal weakness or numbness.  ____________________________________________   PHYSICAL EXAM:  VITAL SIGNS: ED Triage Vitals  Enc Vitals Group     BP 12/18/20 1106 (!) 194/97     Pulse Rate 12/18/20 1106 (!) 104     Resp 12/18/20 1106 (!) 23     Temp 12/18/20 1106 98.5 F (36.9 C)     Temp Source 12/18/20 1106 Oral     SpO2 12/18/20 1106 96 %     Weight 12/18/20 1107 240 lb (108.9 kg)     Height 12/18/20 1107 5\' 4"  (1.626 m)     Head Circumference --      Peak Flow --      Pain Score 12/18/20 1107 7   Constitutional: Alert and oriented.  Eyes: Conjunctivae are normal.  ENT      Head: Normocephalic and atraumatic.      Nose:  No congestion/rhinnorhea.      Mouth/Throat: Mucous membranes are moist.      Neck: No stridor. Hematological/Lymphatic/Immunilogical: No cervical lymphadenopathy. Cardiovascular: Normal rate, regular rhythm.  No murmurs, rubs, or gallops.  Respiratory: Normal respiratory effort without tachypnea nor retractions. Breath sounds are clear and equal bilaterally. No wheezes/rales/rhonchi. Gastrointestinal: Soft and non tender. No rebound. No guarding.  Genitourinary: Deferred Musculoskeletal: Normal range of motion in all extremities. No lower extremity edema. Neurologic:  Normal speech and language. No gross focal neurologic deficits are appreciated.  Skin:  Skin is warm, dry and intact. No rash noted. Psychiatric: Mood and affect are normal. Speech and behavior are normal. Patient exhibits appropriate insight and judgment.  ____________________________________________    LABS (pertinent positives/negatives)  Trop hs 10 CBC wbc 9.5, hgb 14.2, plt 243 BMP wnl except glu 174, cr 1.14  ____________________________________________   EKG  I, Nance Pear, attending physician, personally viewed and interpreted this EKG  EKG Time:  1114 Rate: 106 Rhythm: sinus tachycardia Axis: normal Intervals: qtc 459 QRS: narrow, q waves III ST changes: no st elevation Impression: abnormal ekg ____________________________________________    RADIOLOGY  CXR Low lung volumes without any acute abnormality  CT angio No PE. No acute abnormality  ____________________________________________   PROCEDURES  Procedures  ____________________________________________   INITIAL IMPRESSION / ASSESSMENT AND PLAN / ED COURSE  Pertinent labs & imaging results that were available during my care of the patient were reviewed by me and considered in my medical decision making (see chart for details).  Patient presented to the emergency department today because of concerns for continued cough shortness of breath and URI type symptoms.  Patient did finish a course of antibiotics.  Patient's work-up here without concerning evidence for pneumonia.  I did get a CT angio given that D-dimer was elevated although this did not show any pulmonary embolisms.  At this time I do think likely viral URI/bronchitis.  She did state that she felt somewhat better after breathing treatments.  Patient declined steroid treatment.  Will plan on discharging with prescription for cough medication and further albuterol inhaler.  ____________________________________________   FINAL CLINICAL IMPRESSION(S) / ED DIAGNOSES  Final diagnoses:  Bronchitis     Note: This dictation was prepared with Dragon dictation. Any transcriptional errors that result from this process are unintentional     Nance Pear, MD 12/18/20 1600

## 2020-12-18 NOTE — Telephone Encounter (Signed)
Noted agree with need for evaluation °

## 2020-12-24 ENCOUNTER — Ambulatory Visit (INDEPENDENT_AMBULATORY_CARE_PROVIDER_SITE_OTHER): Payer: 59 | Admitting: Internal Medicine

## 2020-12-24 ENCOUNTER — Encounter: Payer: Self-pay | Admitting: Internal Medicine

## 2020-12-24 ENCOUNTER — Other Ambulatory Visit: Payer: Self-pay

## 2020-12-24 VITALS — BP 140/88 | HR 87 | Temp 97.4°F | Ht 63.0 in | Wt 244.0 lb

## 2020-12-24 DIAGNOSIS — I10 Essential (primary) hypertension: Secondary | ICD-10-CM

## 2020-12-24 DIAGNOSIS — N1831 Chronic kidney disease, stage 3a: Secondary | ICD-10-CM

## 2020-12-24 DIAGNOSIS — I69951 Hemiplegia and hemiparesis following unspecified cerebrovascular disease affecting right dominant side: Secondary | ICD-10-CM

## 2020-12-24 DIAGNOSIS — Z1211 Encounter for screening for malignant neoplasm of colon: Secondary | ICD-10-CM

## 2020-12-24 DIAGNOSIS — Z23 Encounter for immunization: Secondary | ICD-10-CM | POA: Diagnosis not present

## 2020-12-24 DIAGNOSIS — Z0000 Encounter for general adult medical examination without abnormal findings: Secondary | ICD-10-CM | POA: Diagnosis not present

## 2020-12-24 DIAGNOSIS — E1159 Type 2 diabetes mellitus with other circulatory complications: Secondary | ICD-10-CM

## 2020-12-24 LAB — POCT GLYCOSYLATED HEMOGLOBIN (HGB A1C): Hemoglobin A1C: 7.4 % — AB (ref 4.0–5.6)

## 2020-12-24 LAB — HM DIABETES FOOT EXAM

## 2020-12-24 MED ORDER — ALPRAZOLAM 0.25 MG PO TABS
0.2500 mg | ORAL_TABLET | Freq: Three times a day (TID) | ORAL | 0 refills | Status: DC | PRN
Start: 2020-12-24 — End: 2022-12-24

## 2020-12-24 NOTE — Assessment & Plan Note (Signed)
Last GFR 54 Not on ACEI--but will hold off since mild and BP fine for now

## 2020-12-24 NOTE — Assessment & Plan Note (Signed)
Mild weakness Function is stable On ASA, plavix, statin

## 2020-12-24 NOTE — Progress Notes (Signed)
Subjective:    Patient ID: Tami Jones, female    DOB: 1955/12/25, 65 y.o.   MRN: [id number]  HPI Here for physical This visit occurred during the SARS-CoV-2 public health emergency.  Safety protocols were in place, including screening questions prior to the visit, additional usage of staff PPE, and extensive cleaning of exam room while observing appropriate contact time as indicated for disinfecting solutions.   Still has some bronchitis--but finally improving Using the inhaler at times Breathing is better now  Still has the pain along left thoracic flank--from back to front "Like a toothache"--especially at nighttime  Worries about her immune system Has been susceptible to catching things Wonders if she can take vitamins or something Tries to walk driveway slowly every day  Ongoing left side weakness Uses cane to walk  Checking sugars twice a week Usually 140-160's fasting Only taking glipizide   Review of Systems  Constitutional:  Positive for fatigue.       Tries to eat less-but not able to lose weight Wears seat belt  HENT:  Positive for tinnitus. Negative for hearing loss and trouble swallowing.        Needs most teeth extracted--considering implants  Eyes:        Needs right cataract done--was deferred after stroke  Respiratory:  Positive for cough and shortness of breath.   Cardiovascular:  Negative for leg swelling.       Still intermittent upper left chest pain Occasional palpitations  Gastrointestinal:  Negative for blood in stool and constipation.       Occ heartburn--no meds  Endocrine: Negative for polydipsia and polyuria.  Genitourinary:  Negative for dysuria and hematuria.  Musculoskeletal:  Positive for arthralgias and back pain.  Skin:  Negative for rash.  Allergic/Immunologic: Negative for environmental allergies and immunocompromised state.  Neurological:  Positive for dizziness. Negative for syncope and headaches.  Hematological:  Negative for  adenopathy. Does not bruise/bleed easily.  Psychiatric/Behavioral:  Positive for dysphoric mood and sleep disturbance. The patient is nervous/anxious.       Objective:   Physical Exam Constitutional:      Appearance: Normal appearance.  HENT:     Right Ear: External ear normal.     Left Ear: External ear normal.     Mouth/Throat:     Pharynx: No oropharyngeal exudate or posterior oropharyngeal erythema.  Eyes:     Comments: Apparent dense cataract on right  Cardiovascular:     Rate and Rhythm: Normal rate and regular rhythm.     Pulses: Normal pulses.     Heart sounds: No murmur heard.   No gallop.  Pulmonary:     Effort: Pulmonary effort is normal.     Breath sounds: Normal breath sounds. No wheezing or rales.  Abdominal:     Palpations: Abdomen is soft.     Tenderness: There is no abdominal tenderness.  Musculoskeletal:     Cervical back: Neck supple.     Right lower leg: No edema.     Left lower leg: No edema.  Lymphadenopathy:     Cervical: No cervical adenopathy.  Skin:    Findings: No lesion or rash.     Comments: No foot lesions  Neurological:     Mental Status: She is alert and oriented to person, place, and time.     Comments: Slight decrease in sensation on plantar feet  Psychiatric:        Mood and Affect: Mood normal.  Behavior: Behavior normal.           Assessment & Plan:

## 2020-12-24 NOTE — Patient Instructions (Addendum)
Please set up your bivalent COVID vaccine at your pharmacy. Set up your diabetic eye exam---have them send me the report. Please set up your screening mammogram.

## 2020-12-24 NOTE — Assessment & Plan Note (Signed)
Lab Results  Component Value Date   HGBA1C 7.4 (A) 12/24/2020   Acceptable control with just the glipizide Could consider change to dulaglutide--but will hold off for now

## 2020-12-24 NOTE — Assessment & Plan Note (Signed)
Overdue for [facility] her to set up Pap due in 2-3 years FIT--she needs to do right away and not forget Discussed regular exercise Flu vaccine today Bivalent COVID at pharmacy

## 2020-12-24 NOTE — Assessment & Plan Note (Signed)
BP Readings from Last 3 Encounters:  12/24/20 140/88  12/18/20 (!) 174/88  11/10/20 140/86   Acceptable with metoprolol and amlodipine

## 2020-12-24 NOTE — Addendum Note (Signed)
Addended by: Eual Fines on: 12/24/2020 04:05 PM   Modules accepted: Orders

## 2020-12-30 ENCOUNTER — Telehealth: Payer: Self-pay | Admitting: Internal Medicine

## 2020-12-30 NOTE — Telephone Encounter (Signed)
The insurance forms were scanned in and not sent to [place] to fil out. We will work on the forms and fax them back.

## 2020-12-30 NOTE — Telephone Encounter (Signed)
Tami Jones with [employer] called wanting to know if any work restriction have been placed on pt and what they are. [phone] ext [phone]

## 2020-12-31 ENCOUNTER — Other Ambulatory Visit (INDEPENDENT_AMBULATORY_CARE_PROVIDER_SITE_OTHER): Payer: 59

## 2020-12-31 DIAGNOSIS — Z1211 Encounter for screening for malignant neoplasm of colon: Secondary | ICD-10-CM | POA: Diagnosis not present

## 2020-12-31 LAB — FECAL OCCULT BLOOD, IMMUNOCHEMICAL: Fecal Occult Bld: NEGATIVE

## 2021-01-09 ENCOUNTER — Other Ambulatory Visit: Payer: Self-pay | Admitting: Internal Medicine

## 2021-02-05 IMAGING — CT CT HEAD CODE STROKE
3 series · 15 of 47 positions shown, 18 images · non-contrast
Comparison: None.

CLINICAL DATA: Code stroke.  Encephalopathy

EXAM:
CT HEAD WITHOUT CONTRAST
TECHNIQUE: Contiguous axial images were obtained from the base of the skull
through the vertex without intravenous contrast.

[Series 2: head wo · axial · 0.44mm/px · z∈[-108,+17]mm · 9 of 30 slices shown, 12 images]
[im 3/30  brain]
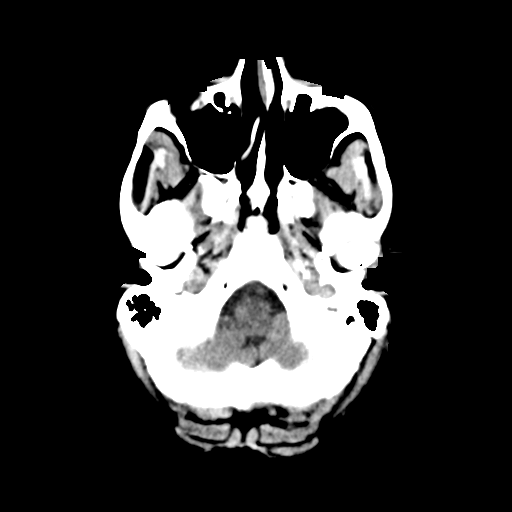
[im 3/30  bone]
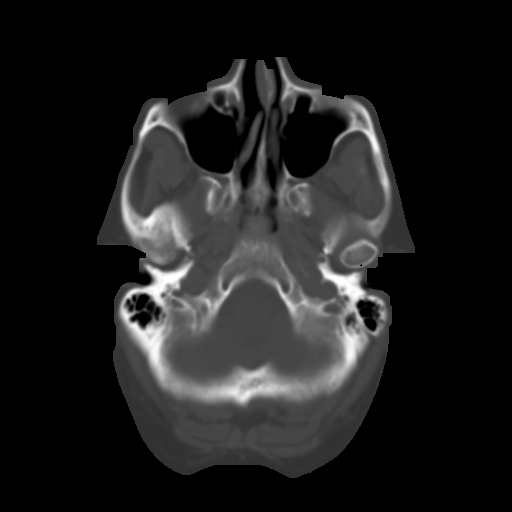
[im 6/30  brain]
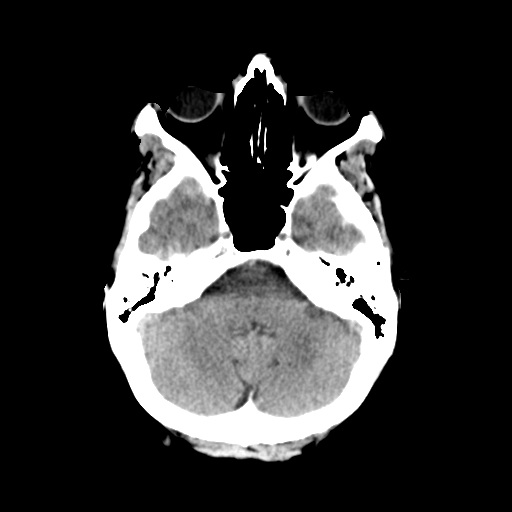
[im 9/30  brain]
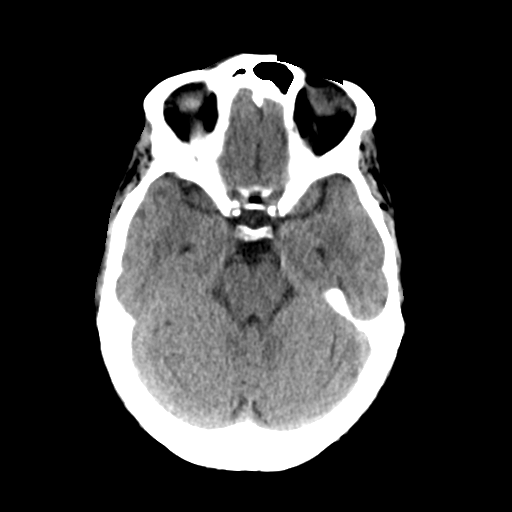
[im 12/30  brain]
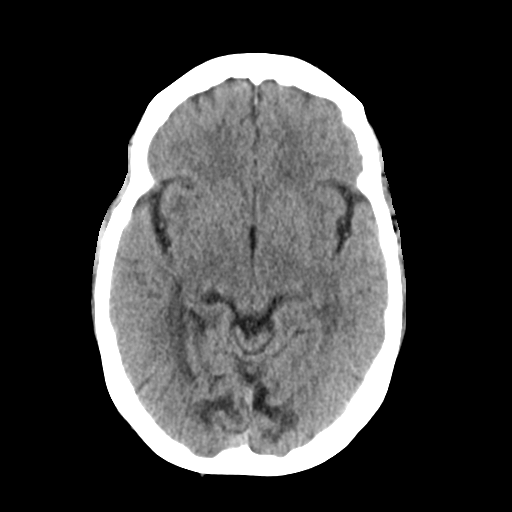
[im 16/30  brain]
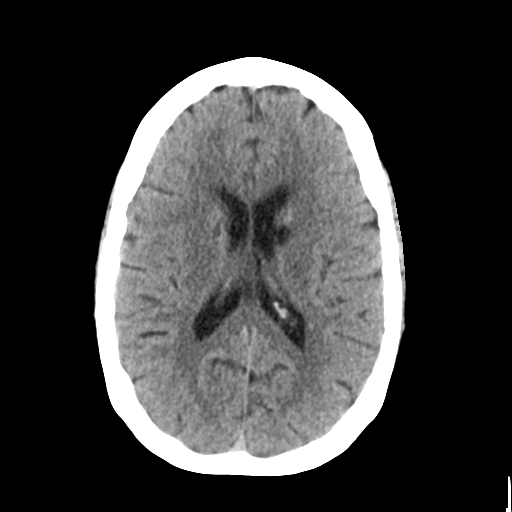
[im 16/30  bone]
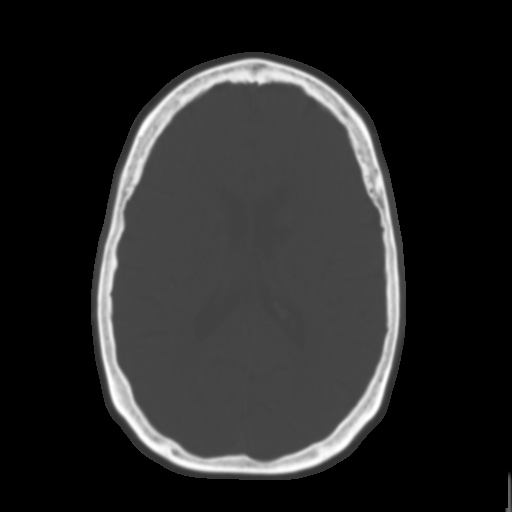
[im 19/30  brain]
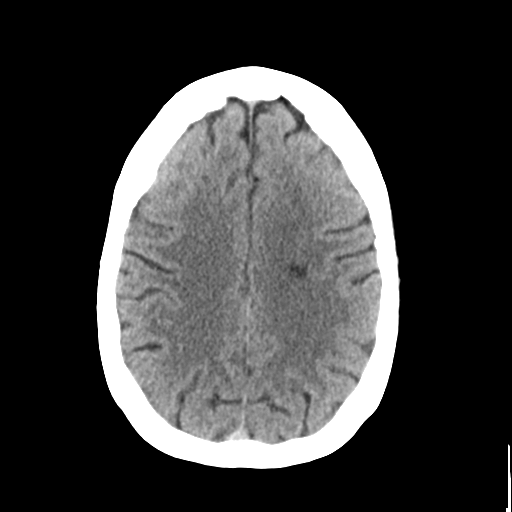
[im 22/30  brain]
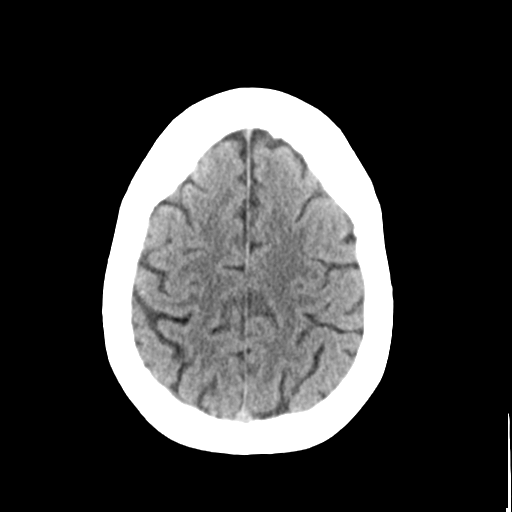
[im 25/30  brain]
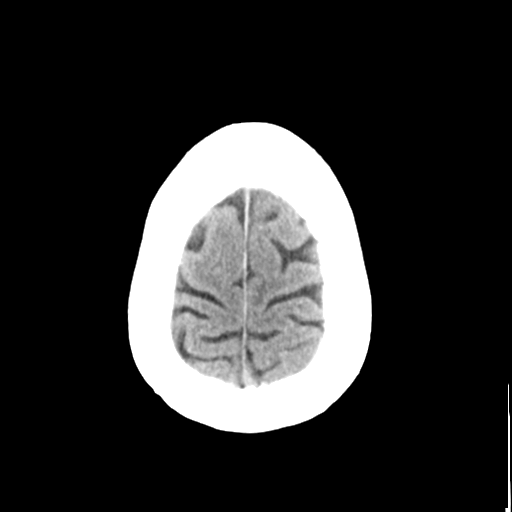
[im 28/30  brain]
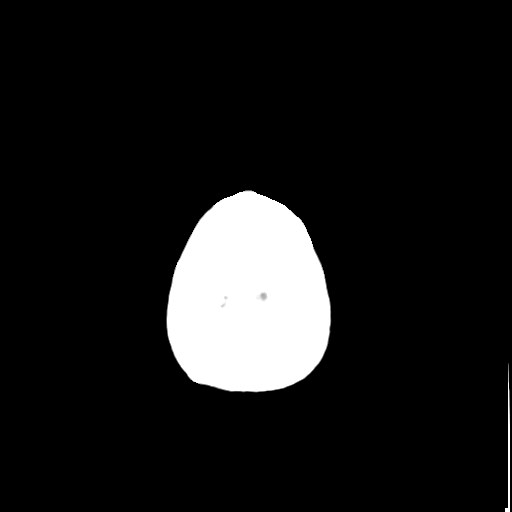
[im 28/30  bone]
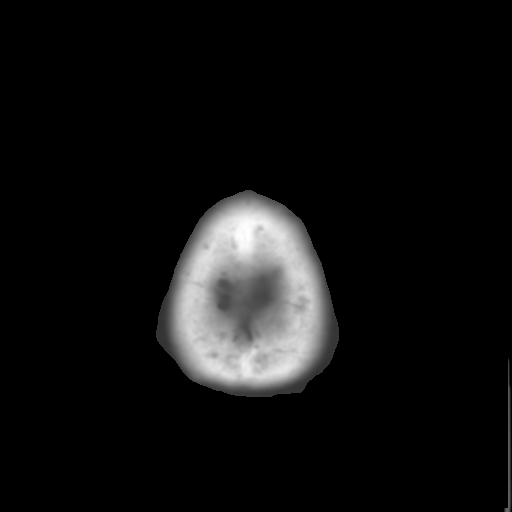

[Series 4: coronal soft tissue · coronal · 0.29mm/px · 3 of 59 slices shown]
[im 20/59  brain]
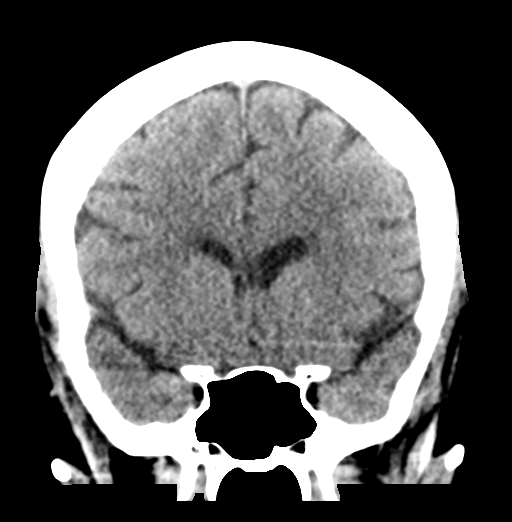
[im 26/59  brain]
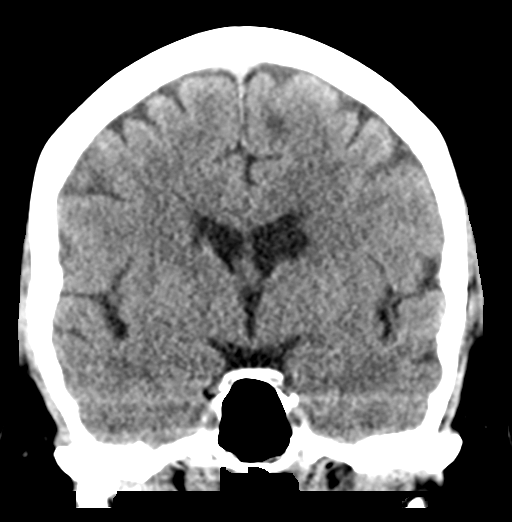
[im 33/59  brain]
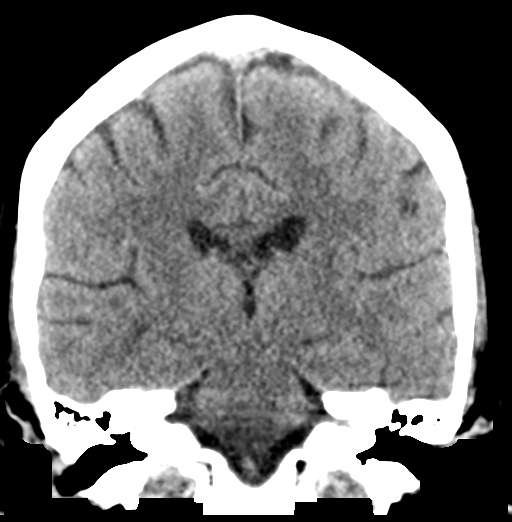

[Series 5: sagittal soft tissue · sagittal · 0.36mm/px · 3 of 48 slices shown]
[im 16/48  brain]
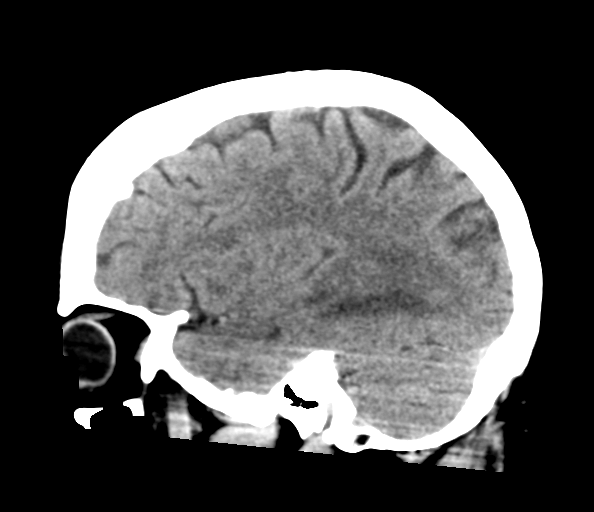
[im 24/48  brain]
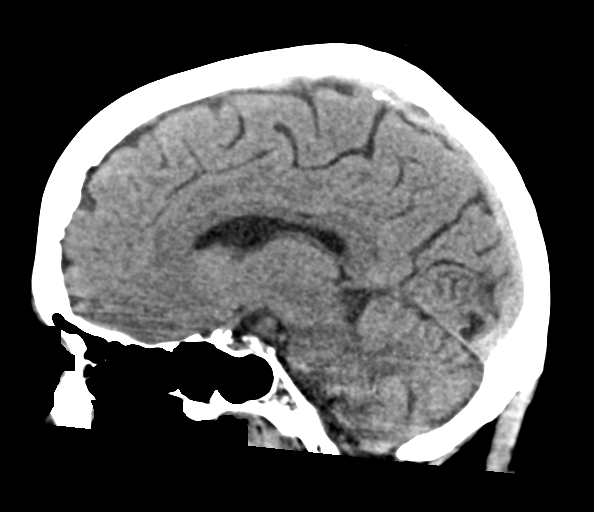
[im 32/48  brain]
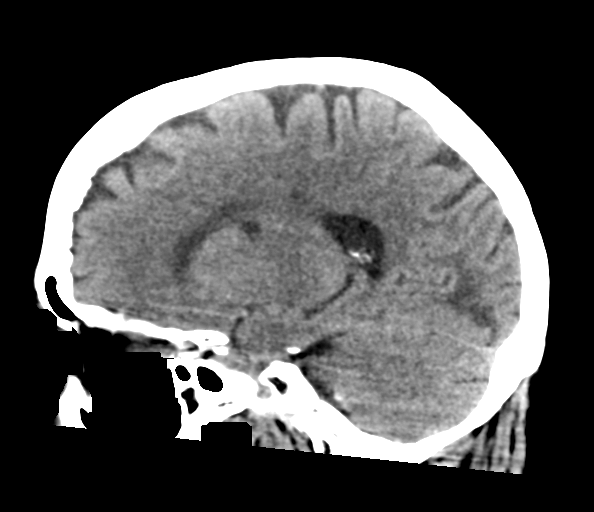

[15 of 47 positions shown; findings below may reference images not displayed]

FINDINGS: Brain: There is no mass, hemorrhage or extra-axial collection. The
size and configuration of the ventricles and extra-axial CSF spaces
are normal. There is hypoattenuation of the periventricular white
matter, most commonly indicating chronic ischemic microangiopathy.
There are old bilateral occipital infarcts and old small vessel
infarcts of the left caudate head and corona radiata.

Vascular: No abnormal hyperdensity of the major intracranial
arteries or dural venous sinuses. No intracranial atherosclerosis.

Skull: The visualized skull base, calvarium and extracranial soft
tissues are normal.

Sinuses/Orbits: No fluid levels or advanced mucosal thickening of
the visualized paranasal sinuses. No mastoid or middle ear effusion.
The orbits are normal.

ASPECTS (Alberta Stroke Program Early CT Score)

- Ganglionic level infarction (caudate, lentiform nuclei, internal
capsule, insula, M1-M3 cortex): 7

- Supraganglionic infarction (M4-M6 cortex): 3

Total score (0-10 with 10 being normal): 10
IMPRESSION: 1. No acute intracranial abnormality.
2. ASPECTS is 10.
3. Old bilateral occipital infarcts and chronic ischemic
microangiopathy.

These results were called by telephone at the time of interpretation
on 08/25/2019 at [DATE] to provider [HOSPITAL] BEDARD , who verbally
acknowledged these results.

## 2021-02-05 IMAGING — MR MR HEAD W/O CM
11 series · 42 of 48 positions shown · non-contrast
Comparison: 08/01/2019

CLINICAL DATA: Encephalopathy

EXAM:
MRI HEAD WITHOUT CONTRAST
TECHNIQUE: Multiplanar, multiecho pulse sequences of the brain and surrounding
structures were obtained without intravenous contrast.

[Series 5: ax dwi_tracew · axial · 3.0mm · 0.60mm/px · z∈[-107,+39]mm · 4 of 48 slices shown]
[im 1/48]
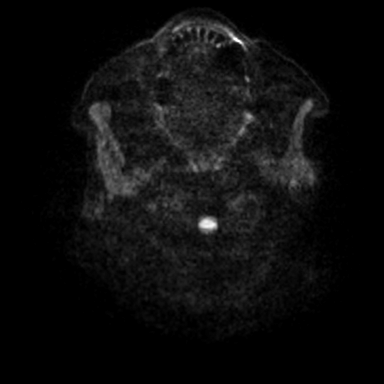
[im 16/48]
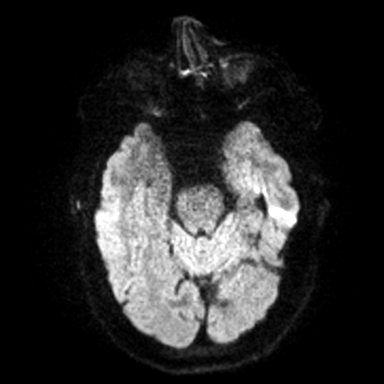
[im 32/48]
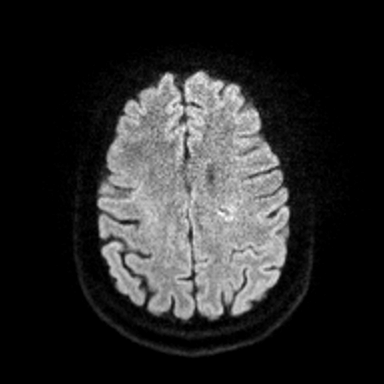
[im 48/48]
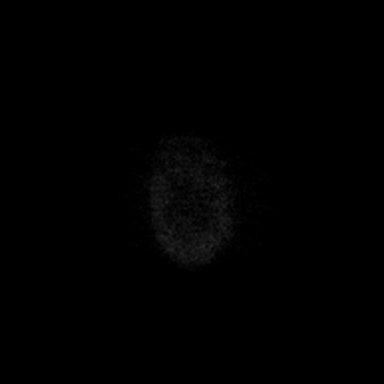

[Series 6: ax dwi_adc · axial · 3.0mm · 0.60mm/px · z∈[-107,+39]mm · 4 of 48 slices shown]
[im 1/48]
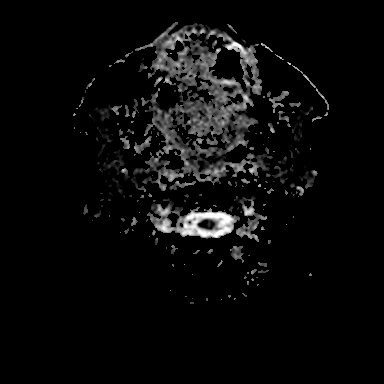
[im 16/48]
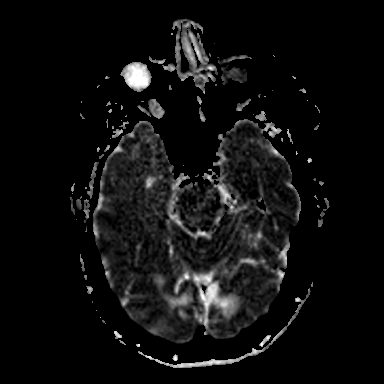
[im 32/48]
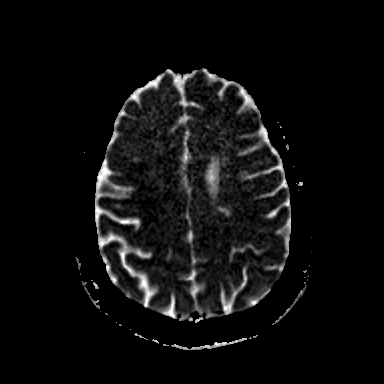
[im 48/48]
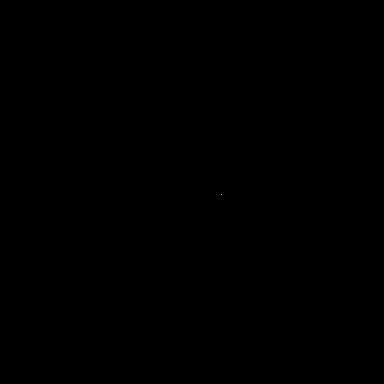

[Series 7: cor dwi_tracew · coronal · 5.0mm · 0.60mm/px · 3 of 38 slices shown]
[im 1/38]
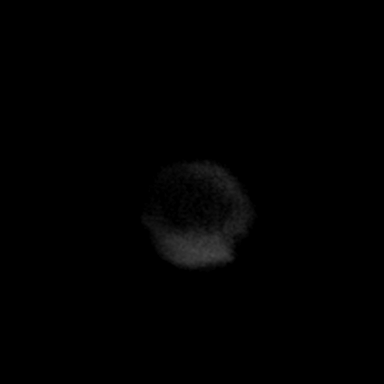
[im 19/38]
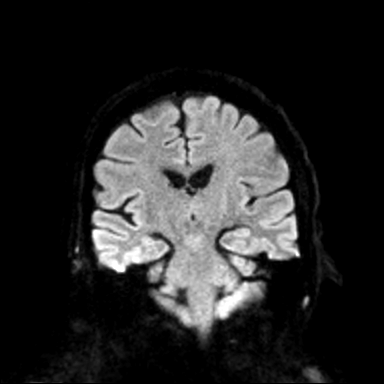
[im 38/38]
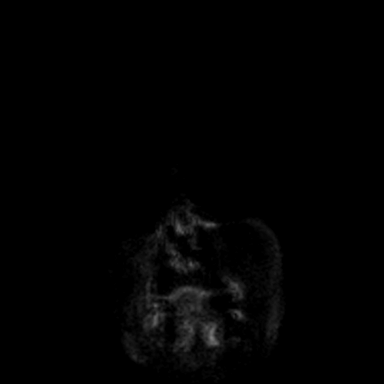

[Series 8: cor dwi_adc · coronal · 5.0mm · 0.60mm/px · 3 of 38 slices shown]
[im 1/38]
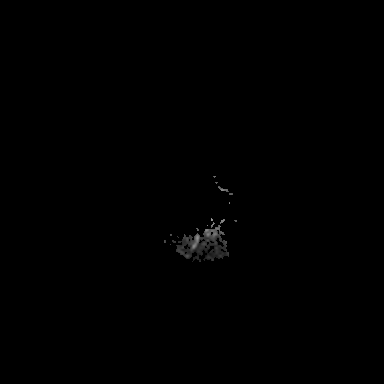
[im 19/38]
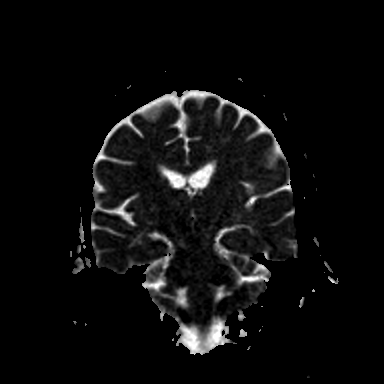
[im 38/38]
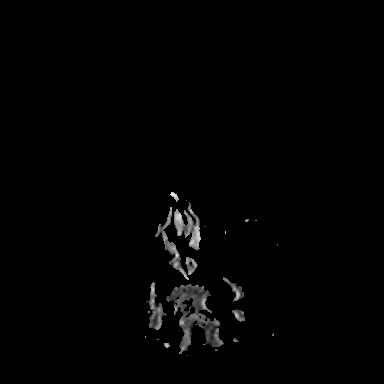

[Series 9: T1 · sagittal · 5.0mm · 0.62mm/px · 2 of 21 slices shown (1 of 2)]
[im 1/21]
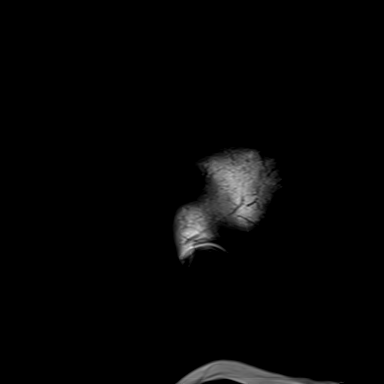
[im 21/21]
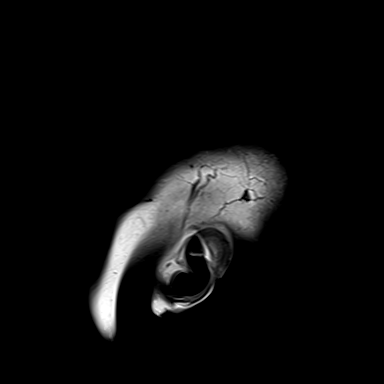

[Series 10: T2 · axial · 5.0mm · 0.53mm/px · z∈[-107,+27]mm · 2 of 25 slices shown (1 of 2)]
[im 1/25]
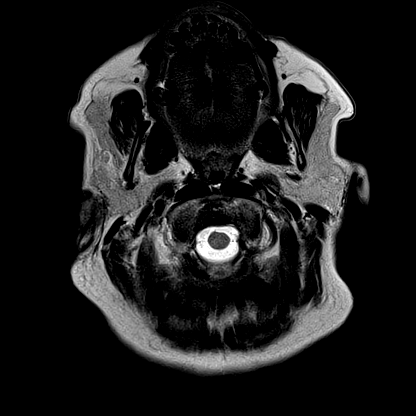
[im 25/25]
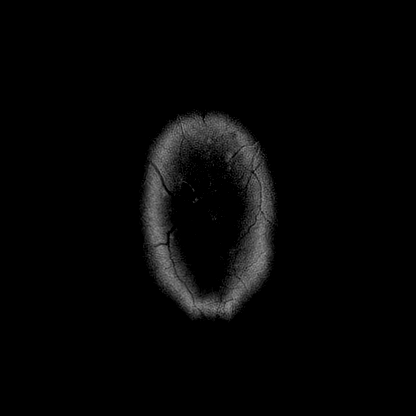

[Series 12: pha_images · axial · 3.0mm · 0.90mm/px · z∈[-120,+45]mm · 5 of 59 slices shown]
[im 1/59]
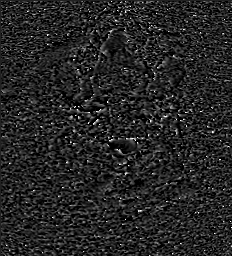
[im 15/59]
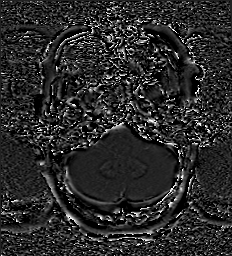
[im 30/59]
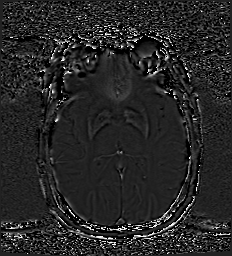
[im 44/59]
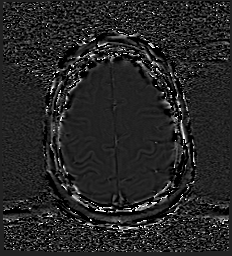
[im 59/59]
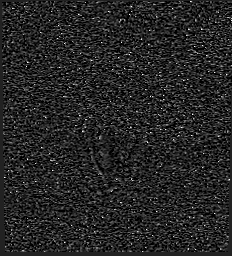

[Series 13: swi_images · axial · 3.0mm · 0.90mm/px · z∈[-120,+3]mm · 4 of 60 slices shown]
[im 1/60]
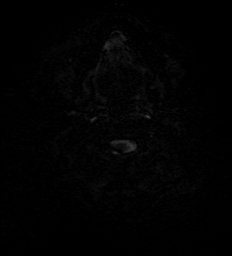
[im 15/60]
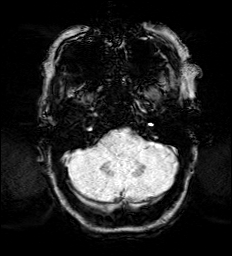
[im 30/60]
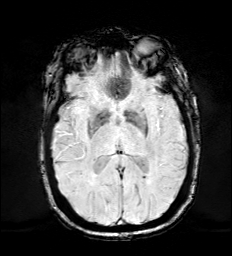
[im 45/60]
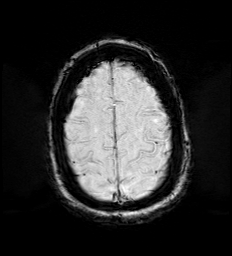

[Series 15: FLAIR · axial · 3.0mm · 0.53mm/px · z∈[-116,+35]mm · 5 of 55 slices shown]
[im 1/55]
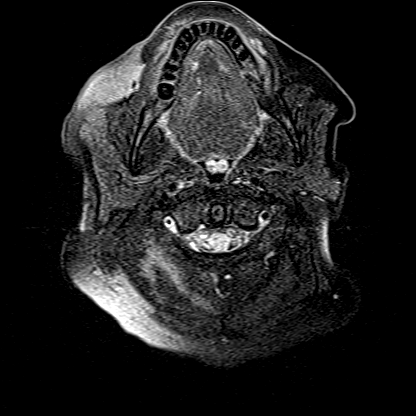
[im 14/55]
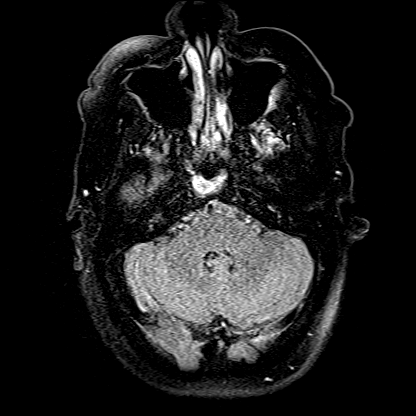
[im 28/55]
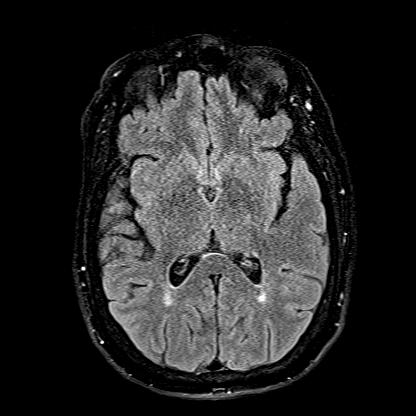
[im 41/55]
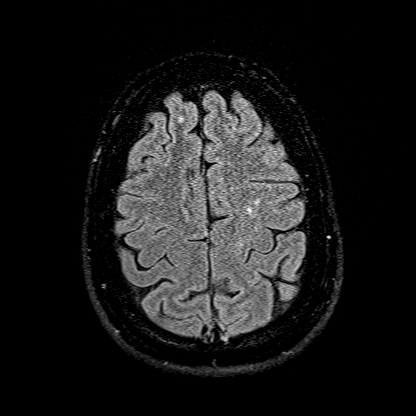
[im 55/55]
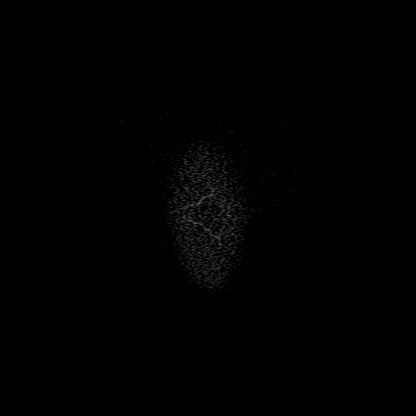

[Series 16: T1 · axial · 1.0mm · 0.98mm/px · z∈[-110,+39]mm · 8 of 160 slices shown (2 of 2)]
[im 1/160]
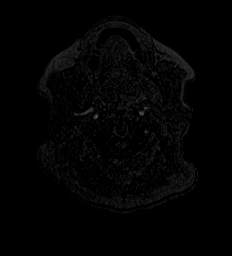
[im 27/160]
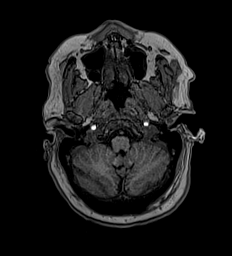
[im 54/160]
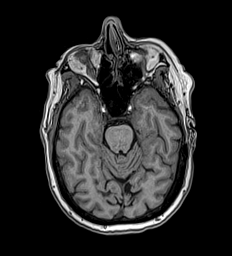
[im 67/160]
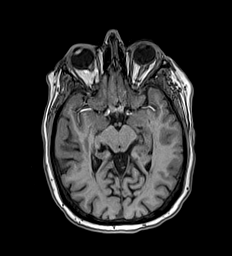
[im 93/160]
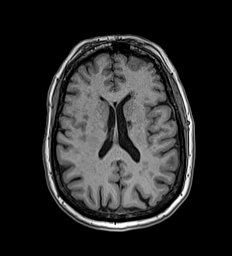
[im 107/160]
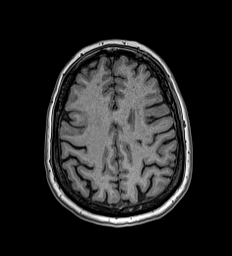
[im 133/160]
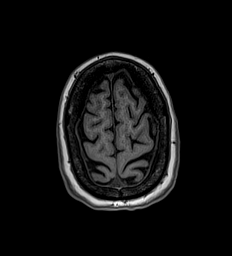
[im 160/160]
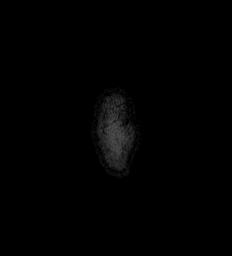

[Series 17: T2 · coronal · 5.0mm · 0.57mm/px · 2 of 29 slices shown (2 of 2)]
[im 1/29]
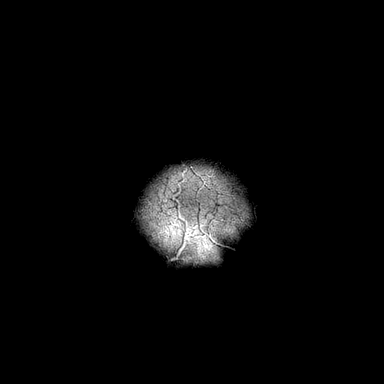
[im 29/29]
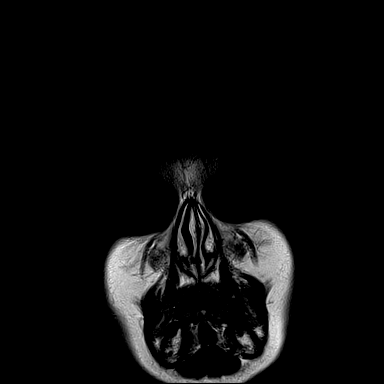

[42 of 48 positions shown; findings below may reference images not displayed]

FINDINGS: BRAIN: No acute infarct, acute hemorrhage or extra-axial collection.
Unchanged appearance of multiple old infarcts, including both
occipital lobes and in the left basal ganglia. Normal volume of CSF
spaces. Single focus of chronic microhemorrhage in the left
periatrial region, unchanged. There is multifocal periventricular
white matter hyperintensity, most often a result of chronic
microvascular ischemia. Normal midline structures.

VASCULAR: Major flow voids are preserved.

SKULL AND UPPER CERVICAL SPINE: Normal calvarium and skull base.
Visualized upper cervical spine and soft tissues are normal.

SINUSES/ORBITS: No paranasal sinus fluid levels or advanced mucosal
thickening. No mastoid or middle ear effusion. Normal orbits.
IMPRESSION: 1. No acute intracranial process.
2. Unchanged appearance of multiple old infarcts and findings of
chronic small vessel ischemia.

## 2021-02-24 ENCOUNTER — Telehealth: Payer: Self-pay | Admitting: Internal Medicine

## 2021-02-24 MED ORDER — CLOPIDOGREL BISULFATE 75 MG PO TABS: 75.0000 mg | ORAL_TABLET | Freq: Every day | ORAL | 3 refills | Status: DC

## 2021-02-24 NOTE — Telephone Encounter (Signed)
1.Medication Requested: clopidogrel (PLAVIX) 75 MG tablet  2. Pharmacy (Name, [place], [place]):  [id number] - [place], [place] - [address] Phone:  [phone]  Fax:  [phone]      3. On Med List: Y  4. Last Visit with PCP: 11.9.22  5. Next visit date with PCP: 5.9.23

## 2021-02-24 NOTE — Telephone Encounter (Signed)
Rx sent electronically.  

## 2021-02-25 ENCOUNTER — Other Ambulatory Visit: Payer: Self-pay

## 2021-02-25 ENCOUNTER — Encounter: Payer: Self-pay | Admitting: Emergency Medicine

## 2021-02-25 ENCOUNTER — Emergency Department: Payer: Medicare HMO

## 2021-02-25 ENCOUNTER — Emergency Department
Admission: EM | Admit: 2021-02-25 | Discharge: 2021-02-25 | Disposition: A | Discharge status: Home / Self Care | Payer: Medicare HMO | Attending: Emergency Medicine | Admitting: Emergency Medicine

## 2021-02-25 DIAGNOSIS — R1084 Generalized abdominal pain: Secondary | ICD-10-CM | POA: Diagnosis not present

## 2021-02-25 DIAGNOSIS — N3090 Cystitis, unspecified without hematuria: Secondary | ICD-10-CM | POA: Insufficient documentation

## 2021-02-25 DIAGNOSIS — Z20822 Contact with and (suspected) exposure to covid-19: Secondary | ICD-10-CM | POA: Insufficient documentation

## 2021-02-25 DIAGNOSIS — I10 Essential (primary) hypertension: Secondary | ICD-10-CM | POA: Insufficient documentation

## 2021-02-25 DIAGNOSIS — I700 Atherosclerosis of aorta: Secondary | ICD-10-CM | POA: Diagnosis not present

## 2021-02-25 DIAGNOSIS — R109 Unspecified abdominal pain: Secondary | ICD-10-CM | POA: Diagnosis not present

## 2021-02-25 DIAGNOSIS — E1165 Type 2 diabetes mellitus with hyperglycemia: Secondary | ICD-10-CM | POA: Diagnosis not present

## 2021-02-25 DIAGNOSIS — E119 Type 2 diabetes mellitus without complications: Secondary | ICD-10-CM | POA: Insufficient documentation

## 2021-02-25 DIAGNOSIS — R1110 Vomiting, unspecified: Secondary | ICD-10-CM | POA: Diagnosis not present

## 2021-02-25 LAB — CBC
HCT: 43.1 % (ref 36.0–46.0)
Hemoglobin: 14.3 g/dL (ref 12.0–15.0)
MCH: 29.9 pg (ref 26.0–34.0)
MCHC: 33.2 g/dL (ref 30.0–36.0)
MCV: 90 fL (ref 80.0–100.0)
Platelets: 246 10*3/uL (ref 150–400)
RBC: 4.79 MIL/uL (ref 3.87–5.11)
RDW: 12.2 % (ref 11.5–15.5)
WBC: 11.9 10*3/uL — ABNORMAL HIGH (ref 4.0–10.5)
nRBC: 0 % (ref 0.0–0.2)

## 2021-02-25 LAB — URINALYSIS, ROUTINE W REFLEX MICROSCOPIC
Bilirubin Urine: NEGATIVE
Glucose, UA: >=500 mg/dL — AB
Ketones, ur: 20 mg/dL — AB
Leukocytes,Ua: NEGATIVE
Nitrite: NEGATIVE
Protein, ur: 30 mg/dL — AB
Specific Gravity, Urine: 1.015 (ref 1.005–1.030)
pH: 6 (ref 5.0–8.0)

## 2021-02-25 LAB — HEPATIC FUNCTION PANEL
ALT: 24 U/L (ref 0–44)
AST: 25 U/L (ref 15–41)
Albumin: 4.2 g/dL (ref 3.5–5.0)
Alkaline Phosphatase: 82 U/L (ref 38–126)
Bilirubin, Direct: <0.1 mg/dL (ref 0.0–0.2)
Total Bilirubin: 0.6 mg/dL (ref 0.3–1.2)
Total Protein: 7.9 g/dL (ref 6.5–8.1)

## 2021-02-25 LAB — BASIC METABOLIC PANEL
Anion gap: 10 (ref 5–15)
BUN: 15 mg/dL (ref 8–23)
CO2: 26 mmol/L (ref 22–32)
Calcium: 9 mg/dL (ref 8.9–10.3)
Chloride: 100 mmol/L (ref 98–111)
Creatinine, Ser: 1.01 mg/dL — ABNORMAL HIGH (ref 0.44–1.00)
GFR, Estimated: >60 mL/min (ref >60)
Glucose, Bld: 264 mg/dL — ABNORMAL HIGH (ref 70–99)
Potassium: 4.1 mmol/L (ref 3.5–5.1)
Sodium: 136 mmol/L (ref 135–145)

## 2021-02-25 LAB — RESP PANEL BY RT-PCR (FLU A&B, COVID) ARPGX2
Influenza A by PCR: NEGATIVE
Influenza B by PCR: NEGATIVE
SARS Coronavirus 2 by RT PCR: NEGATIVE

## 2021-02-25 LAB — LIPASE, BLOOD: Lipase: 32 U/L (ref 11–51)

## 2021-02-25 MED ORDER — IOHEXOL 300 MG/ML  SOLN
100.0000 mL | Freq: Once | INTRAMUSCULAR | Status: AC | PRN
  Administered 2021-02-25: 100 mL via INTRAVENOUS
  Filled 2021-02-25: qty 100

## 2021-02-25 MED ORDER — SODIUM CHLORIDE 0.9 % IV SOLN
1.0000 g | Freq: Once | INTRAVENOUS | Status: AC
  Administered 2021-02-25: 1 g via INTRAVENOUS
  Filled 2021-02-25: qty 10

## 2021-02-25 MED ORDER — FAMOTIDINE 20 MG PO TABS: 20.0000 mg | ORAL_TABLET | Freq: Two times a day (BID) | ORAL | 0 refills | Status: DC

## 2021-02-25 MED ORDER — ONDANSETRON 4 MG PO TBDP: 4.0000 mg | ORAL_TABLET | Freq: Three times a day (TID) | ORAL | 0 refills | Status: DC | PRN

## 2021-02-25 MED ORDER — CEPHALEXIN 500 MG PO CAPS: 500.0000 mg | ORAL_CAPSULE | Freq: Three times a day (TID) | ORAL | 0 refills | Status: DC

## 2021-02-25 MED ORDER — PANTOPRAZOLE SODIUM 40 MG IV SOLR
40.0000 mg | Freq: Once | INTRAVENOUS | Status: AC
  Administered 2021-02-25: 40 mg via INTRAVENOUS
  Filled 2021-02-25: qty 40

## 2021-02-25 MED ORDER — KETOROLAC TROMETHAMINE 30 MG/ML IJ SOLN
15.0000 mg | INTRAMUSCULAR | Status: AC
  Administered 2021-02-25: 15 mg via INTRAVENOUS
  Filled 2021-02-25: qty 1

## 2021-02-25 MED ORDER — SODIUM CHLORIDE 0.9 % IV BOLUS
1000.0000 mL | Freq: Once | INTRAVENOUS | Status: AC
  Administered 2021-02-25: 1000 mL via INTRAVENOUS

## 2021-02-25 MED ORDER — ONDANSETRON HCL 4 MG/2ML IJ SOLN
4.0000 mg | Freq: Once | INTRAMUSCULAR | Status: AC
  Administered 2021-02-25: 4 mg via INTRAVENOUS
  Filled 2021-02-25: qty 2

## 2021-02-25 NOTE — ED Triage Notes (Signed)
Pt to ED via POV with complaints of bilat flank pain with N/V. She denies any pain with urination but is having frequency.

## 2021-02-25 NOTE — ED Provider Notes (Signed)
[facility] Provider Note    Event Date/Time   First MD Initiated Contact with Patient 02/25/21 1250     (approximate)   History   Flank Pain   HPI  Tami Jones is a 66 y.o. female with a history of diabetes, hypertension, hyperlipidemia, GERD, mood disorder who comes the ED complaining of generalized abdominal pain and bilateral flank pain associated with nausea and vomiting that started about 3 days ago.  Gradual onset, waxing and waning, constant, no aggravating or alleviating factors.  She reports poor appetite and oral intake during this time.  No fever.  No diarrhea.     Physical Exam   Triage Vital Signs: ED Triage Vitals  Enc Vitals Group     BP 02/25/21 1238 (!) 145/76     Pulse Rate 02/25/21 1238 67     Resp 02/25/21 1238 17     Temp 02/25/21 1238 97.8 F (36.6 C)     Temp Source 02/25/21 1238 Oral     SpO2 02/25/21 1238 97 %     Weight 02/25/21 1135 239 lb (108.4 kg)     Height 02/25/21 1135 5\' 3"  (1.6 m)     Head Circumference --      Peak Flow --      Pain Score 02/25/21 1134 9     Pain Loc --      Pain Edu? --      Excl. in GC? --     Most recent vital signs: Vitals:   02/25/21 1238  BP: (!) 145/76  Pulse: 67  Resp: 17  Temp: 97.8 F (36.6 C)  SpO2: 97%     General: Awake, no distress.  CV:  Good peripheral perfusion.  Regular rate and rhythm Resp:  Normal effort.  Clear to auscultation bilaterally Abd:  No distention.  Generalized discomfort without focal tenderness.  Soft, no rebound rigidity or guarding.  No CVA tenderness Other:  No lower extremity edema or swelling.  No rash.  Somewhat dry mucous membranes   ED Results / Procedures / Treatments   Labs (all labs ordered are listed, but only abnormal results are displayed) Labs Reviewed  URINALYSIS, ROUTINE W REFLEX MICROSCOPIC - Abnormal; Notable for the following components:      Result Value   Color, Urine YELLOW (*)    APPearance CLEAR (*)     Glucose, UA >=500 (*)    Hgb urine dipstick SMALL (*)    Ketones, ur 20 (*)    Protein, ur 30 (*)    Bacteria, UA FEW (*)    All other components within normal limits  BASIC METABOLIC PANEL - Abnormal; Notable for the following components:   Glucose, Bld 264 (*)    Creatinine, Ser 1.01 (*)    All other components within normal limits  CBC - Abnormal; Notable for the following components:   WBC 11.9 (*)    All other components within normal limits  RESP PANEL BY RT-PCR (FLU A&B, COVID) ARPGX2  URINE CULTURE  HEPATIC FUNCTION PANEL  LIPASE, BLOOD     EKG     RADIOLOGY CT abdomen pelvis viewed and interpreted by me, appears unremarkable without evidence of biliary disease, pancreatitis, or bowel obstruction.  No free air or free fluid.  Radiology report reviewed.    PROCEDURES:  Critical Care performed: No  Procedures   MEDICATIONS ORDERED IN ED: Medications  cefTRIAXone (ROCEPHIN) 1 g in sodium chloride 0.9 % 100 mL IVPB (1 g  Intravenous New Bag/Given 02/25/21 1433)  sodium chloride 0.9 % bolus 1,000 mL (1,000 mLs Intravenous New Bag/Given 02/25/21 1317)  ondansetron (ZOFRAN) injection 4 mg (4 mg Intravenous Given 02/25/21 1317)  pantoprazole (PROTONIX) injection 40 mg (40 mg Intravenous Given 02/25/21 1318)  ketorolac (TORADOL) 30 MG/ML injection 15 mg (15 mg Intravenous Given 02/25/21 1319)  iohexol (OMNIPAQUE) 300 MG/ML solution 100 mL (100 mLs Intravenous Contrast Given 02/25/21 1417)     IMPRESSION / MDM / ASSESSMENT AND PLAN / ED COURSE  I reviewed the triage vital signs and the nursing notes.                              Differential diagnosis includes, but is not limited to, pancreatitis, biliary disease, viral illness, dehydration, gastritis, bowel obstruction, diverticulitis     Patient presents with generalized abdominal pain, multiple symptoms suggestive of a viral illness.  Due to age, diabetes with some elevated blood sugar here in the ED, will obtain  CT scan along with labs.  Urinalysis shows some moderate ketones, patient appears somewhat dehydrated.  Will give IV fluids.  IV Toradol Zofran and Protonix for symptom relief.  ----------------------------------------- 2:49 PM on 02/25/2021 ----------------------------------------- Patient feeling better.  Labs and CT scan are all reassuring.  Urinalysis suggestive of mild UTI, will prescribe Keflex.  Vital signs remained stable.  We will to tolerate p.o.  Likely viral syndrome, stable for discharge home.       FINAL CLINICAL IMPRESSION(S) / ED DIAGNOSES   Final diagnoses:  Generalized abdominal pain  Type 2 diabetes mellitus without complication, without long-term current use of insulin (HCC)  Cystitis     Rx / DC Orders   ED Discharge Orders          Ordered    cephALEXin (KEFLEX) 500 MG capsule  3 times daily        02/25/21 1448    famotidine (PEPCID) 20 MG tablet  2 times daily        02/25/21 1448    ondansetron (ZOFRAN-ODT) 4 MG disintegrating tablet  Every 8 hours PRN        02/25/21 1448             Note:  This document was prepared using Dragon voice recognition software and may include unintentional dictation errors.   Sharman Cheek, MD 02/25/21 [phone]

## 2021-02-27 ENCOUNTER — Telehealth: Payer: Self-pay

## 2021-02-27 LAB — URINE CULTURE: Culture: >=100000 — AB

## 2021-02-27 NOTE — Telephone Encounter (Signed)
Spoke to pt about recent ER visit. She said she is feeling a little better. Will call us back next week if she does not continue to improve.

## 2021-03-22 ENCOUNTER — Other Ambulatory Visit: Payer: Self-pay | Admitting: Internal Medicine

## 2021-03-23 MED ORDER — GLIPIZIDE ER 5 MG PO TB24: ORAL_TABLET | ORAL | 3 refills | Status: DC

## 2021-03-23 NOTE — Addendum Note (Signed)
Addended by: Eual Fines on: 03/23/2021 01:30 PM   Modules accepted: Orders

## 2021-05-13 ENCOUNTER — Ambulatory Visit: Payer: Medicare HMO | Admitting: Internal Medicine

## 2021-05-15 ENCOUNTER — Encounter: Payer: Self-pay | Admitting: Internal Medicine

## 2021-05-15 ENCOUNTER — Ambulatory Visit (INDEPENDENT_AMBULATORY_CARE_PROVIDER_SITE_OTHER): Payer: Medicare HMO | Admitting: Internal Medicine

## 2021-05-15 DIAGNOSIS — N1831 Chronic kidney disease, stage 3a: Secondary | ICD-10-CM | POA: Diagnosis not present

## 2021-05-15 DIAGNOSIS — I10 Essential (primary) hypertension: Secondary | ICD-10-CM

## 2021-05-15 DIAGNOSIS — I69351 Hemiplegia and hemiparesis following cerebral infarction affecting right dominant side: Secondary | ICD-10-CM

## 2021-05-15 DIAGNOSIS — E1159 Type 2 diabetes mellitus with other circulatory complications: Secondary | ICD-10-CM

## 2021-05-15 LAB — POCT GLYCOSYLATED HEMOGLOBIN (HGB A1C): Hemoglobin A1C: 7.2 % — AB (ref 4.0–5.6)

## 2021-05-15 MED ORDER — PANTOPRAZOLE SODIUM 40 MG PO TBEC: 40.0000 mg | DELAYED_RELEASE_TABLET | Freq: Every day | ORAL | 0 refills | Status: DC

## 2021-05-15 MED ORDER — LISINOPRIL 10 MG PO TABS: 10.0000 mg | ORAL_TABLET | Freq: Every day | ORAL | 3 refills | Status: DC

## 2021-05-15 NOTE — Assessment & Plan Note (Signed)
BP Readings from Last 3 Encounters:  ?05/15/21 (!) 148/82  ?02/25/21 (!) 145/76  ?12/24/20 140/88  ? ?Given history of stroke---will start lisinopril 10mg  daily ?Continue metoprolol 25mg  and amlodipine 10mg  daily ?Recheck 2 weeks ?

## 2021-05-15 NOTE — Progress Notes (Signed)
? ?Subjective:  ? ? Patient ID: Tami Jones, female    DOB: 11/17/1955, 66 y.o.   MRN: [id number] ? ?HPI ?Here for follow up of diabetes and other chronic health conditions ? ?Keeps getting colds ?3rd in past 2 months----seems to be better for a few weeks, then gets again ?Grandkids live with her---but haven't been sick in a while ? ?Sugars running up some ?Eating the same foods--fairly healthy ?Will spike up at times---like 178 this morning ? ?BP also up---especially at night ?Can be 200/110 at night---if she doesn't feel right ?Some panic attacks---that is when it is very high ?Did have GFR in 50's though was some better last time ? ?Gets occasional mild chest pain--related to URI ?At rest or with activity--may be related to cough ?Breathing is short at times--mostly it is okay (will have to rest if she does a lot of housework, etc) ?Still gets fatigue---and ongoing right side weakness (and has regular right leg pain, especially at night) ? ?Current Outpatient Medications on File Prior to Visit  ?Medication Sig Dispense Refill  ? acetaminophen (TYLENOL) 325 MG tablet Take 650 mg by mouth every 6 (six) hours as needed.    ? albuterol (VENTOLIN HFA) 108 (90 Base) MCG/ACT inhaler Inhale 2 puffs into the lungs every 6 (six) hours as needed for wheezing or shortness of breath. 8 g 2  ? ALPRAZolam (XANAX) 0.25 MG tablet Take 1 tablet (0.25 mg total) by mouth 3 (three) times daily as needed for anxiety or sleep. 30 tablet 0  ? amLODipine (NORVASC) 10 MG tablet Take 1 tablet (10 mg total) by mouth every evening. 90 tablet 3  ? aspirin EC 81 MG tablet Take 81 mg by mouth every evening.     ? clopidogrel (PLAVIX) 75 MG tablet Take 1 tablet (75 mg total) by mouth daily. 90 tablet 3  ? Elastic Bandages & Supports (MEDICAL COMPRESSION SOCKS) MISC 1 each by Does not apply route daily. 2 each 1  ? famotidine (PEPCID) 20 MG tablet Take 1 tablet (20 mg total) by mouth 2 (two) times daily. 60 tablet 0  ? glipiZIDE (GLUCOTROL XL)  5 MG 24 hr tablet TAKE 1 TABLET BY MOUTH EVERY DAY WITH BREAKFAST 90 tablet 3  ? MELATONIN GUMMIES PO Take 1 tablet by mouth at bedtime as needed.    ? metoprolol succinate (TOPROL-XL) 25 MG 24 hr tablet TAKE 1 TABLET(25 MG) BY MOUTH DAILY 90 tablet 2  ? ondansetron (ZOFRAN-ODT) 4 MG disintegrating tablet Take 1 tablet (4 mg total) by mouth every 8 (eight) hours as needed for nausea or vomiting. 20 tablet 0  ? atorvastatin (LIPITOR) 80 MG tablet Take 1 tablet (80 mg total) by mouth every evening.    ? ?No current facility-administered medications on file prior to visit.  ? ? ?Allergies  ?Allergen Reactions  ? Citalopram Other (See Comments)  ?  Feels odd  ? Doxycycline Hyclate Nausea And Vomiting  ? Erythromycin Other (See Comments)  ?  Irritated stomach  ? Montelukast Sodium Itching  ? Nitrofurantoin Nausea And Vomiting  ? Sulfa Antibiotics Nausea And Vomiting  ? Tramadol Hcl Other (See Comments)  ?  Feels odd  ? Venlafaxine Hcl Other (See Comments)  ?  Feels odd  ? Amoxicillin-Pot Clavulanate Nausea And Vomiting  ? Clarithromycin Rash  ?   See ER note 02/2017  ? ? ?Past Medical History:  ?Diagnosis Date  ? Anxiety   ? Asthma   ? CVA (cerebral vascular accident) (  HCC) 03/2006  ? right occipital, Dr. Thad Ranger  ? Diabetes mellitus   ? GERD (gastroesophageal reflux disease)   ? Hyperlipidemia   ? Hypertension   ? ? ?Past Surgical History:  ?Procedure Laterality Date  ? CARDIOVASCULAR STRESS TEST  1/15  ? myoview. EF 60%  ? CESAREAN SECTION    ? ESOPHAGOGASTRODUODENOSCOPY  05/2005  ? TONSILLECTOMY AND ADENOIDECTOMY    ? ? ?Family History  ?Problem Relation Age of Onset  ? Coronary artery disease Mother   ? Diabetes Mellitus II Mother   ? Heart attack Mother 40  ? Diabetes Mellitus II Maternal Grandmother   ? ? ?Social History  ? ?Socioeconomic History  ? Marital status: Divorced  ?  Spouse name: Not on file  ? Number of children: 2  ? Years of education: Not on file  ? Highest education level: Not on file  ?Occupational  History  ? Occupation: LAB TECH  ?  Employer: [employer]  ?Tobacco Use  ? Smoking status: Never  ? Smokeless tobacco: Never  ?Vaping Use  ? Vaping Use: Never used  ?Substance and Sexual Activity  ? Alcohol use: No  ?  Alcohol/week: 0.0 standard drinks  ?  Comment: wine occassionally  ? Drug use: No  ? Sexual activity: Yes  ?  Birth control/protection: Condom  ?Other Topics Concern  ? Not on file  ?Social History Narrative  ? Not on file  ? ?Social Determinants of Health  ? ?Financial Resource Strain: Not on file  ?Food Insecurity: Not on file  ?Transportation Needs: Not on file  ?Physical Activity: Not on file  ?Stress: Not on file  ?Social Connections: Not on file  ?Intimate Partner Violence: Not on file  ? ?Review of Systems ?Appetite is variable---not much at times ?Weight is stable ?Not sleeping well ?Will be getting right cataract removed soon ? ?   ?Objective:  ? Physical Exam ?Constitutional:   ?   Appearance: Normal appearance.  ?Cardiovascular:  ?   Rate and Rhythm: Normal rate and regular rhythm.  ?   Pulses: Normal pulses.  ?   Heart sounds: No murmur heard. ?  No gallop.  ?Pulmonary:  ?   Effort: Pulmonary effort is normal.  ?   Breath sounds: Normal breath sounds. No wheezing or rales.  ?Musculoskeletal:  ?   Cervical back: Neck supple.  ?Lymphadenopathy:  ?   Cervical: No cervical adenopathy.  ?Neurological:  ?   Mental Status: She is alert.  ?   Comments: Mild right side weakness  ?Psychiatric:     ?   Mood and Affect: Mood normal.     ?   Behavior: Behavior normal.  ?  ? ? ? ? ?   ?Assessment & Plan:  ? ?

## 2021-05-15 NOTE — Assessment & Plan Note (Signed)
Ongoing disability due to stroke ?On plavix 75mg  and atorvastatin 80mg  daily ?BP meds as well ?

## 2021-05-15 NOTE — Assessment & Plan Note (Signed)
GFR in 50's--though slightly better last time ?Will start the lisinopril 10mg   ?Recheck with labs in 6 weeks ?

## 2021-05-15 NOTE — Assessment & Plan Note (Signed)
Lab Results  ?Component Value Date  ? HGBA1C 7.2 (A) 05/15/2021  ? ?Still good control on glipizide 5mg  daily ?Discussed that overall control is still good---AM sugars could be better with second glipizide, but would have increased hypoglycemic risk ?Given her vascular history---adding jardiance/farxiga would make sense (but will hold off for now) ?

## 2021-06-23 ENCOUNTER — Ambulatory Visit (INDEPENDENT_AMBULATORY_CARE_PROVIDER_SITE_OTHER): Payer: Medicare HMO | Admitting: Internal Medicine

## 2021-06-23 ENCOUNTER — Encounter: Payer: Self-pay | Admitting: Internal Medicine

## 2021-06-23 VITALS — BP 130/86 | HR 58 | Temp 97.3°F | Ht 63.0 in | Wt 249.0 lb

## 2021-06-23 DIAGNOSIS — I10 Essential (primary) hypertension: Secondary | ICD-10-CM

## 2021-06-23 DIAGNOSIS — E1152 Type 2 diabetes mellitus with diabetic peripheral angiopathy with gangrene: Secondary | ICD-10-CM

## 2021-06-23 LAB — RENAL FUNCTION PANEL
Albumin: 4.1 g/dL (ref 3.5–5.2)
BUN: 17 mg/dL (ref 6–23)
CO2: 27 mEq/L (ref 19–32)
Calcium: 9.1 mg/dL (ref 8.4–10.5)
Chloride: 103 mEq/L (ref 96–112)
Creatinine, Ser: 1.03 mg/dL (ref 0.40–1.20)
GFR: 57.46 mL/min — ABNORMAL LOW (ref >60.00)
Glucose, Bld: 149 mg/dL — ABNORMAL HIGH (ref 70–99)
Phosphorus: 3.5 mg/dL (ref 2.3–4.6)
Potassium: 4.1 mEq/L (ref 3.5–5.1)
Sodium: 138 mEq/L (ref 135–145)

## 2021-06-23 MED ORDER — DAPAGLIFLOZIN PROPANEDIOL 5 MG PO TABS: 5.0000 mg | ORAL_TABLET | Freq: Every day | ORAL | 3 refills | Status: DC

## 2021-06-23 MED ORDER — ATORVASTATIN CALCIUM 80 MG PO TABS: 80.0000 mg | ORAL_TABLET | Freq: Every evening | ORAL | 3 refills | Status: DC

## 2021-06-23 MED ORDER — PANTOPRAZOLE SODIUM 40 MG PO TBEC: 40.0000 mg | DELAYED_RELEASE_TABLET | Freq: Every day | ORAL | 3 refills | Status: DC

## 2021-06-23 NOTE — Assessment & Plan Note (Signed)
BP Readings from Last 3 Encounters:  ?06/23/21 130/86  ?05/15/21 (!) 148/82  ?02/25/21 (!) 145/76  ? ?Better now with addition of lisinopril 10 ?On amlodipine 10 as well and metoprolol ?If more swelling, could decrease the amlodipine and increase lisinopril to 20 ?

## 2021-06-23 NOTE — Progress Notes (Signed)
? ?Subjective:  ? ? Patient ID: Tami Jones, female    DOB: [birth year], 66 y.o.   MRN: [id number] ? ?HPI ?Here for recheck of blood pressure ? ?No problems with the new blood pressure medication ?Does have feeling of "tight" in her hands and feet have fluid ?Not a real big deal ?Home BP 140/80's---clearly better ?No chest pain ? ?Woke today not feeling quite right--vague ?Some SOB feeling---bloated ? ?Current Outpatient Medications on File Prior to Visit  ?Medication Sig Dispense Refill  ? acetaminophen (TYLENOL) 325 MG tablet Take 650 mg by mouth every 6 (six) hours as needed.    ? albuterol (VENTOLIN HFA) 108 (90 Base) MCG/ACT inhaler Inhale 2 puffs into the lungs every 6 (six) hours as needed for wheezing or shortness of breath. 8 g 2  ? ALPRAZolam (XANAX) 0.25 MG tablet Take 1 tablet (0.25 mg total) by mouth 3 (three) times daily as needed for anxiety or sleep. 30 tablet 0  ? amLODipine (NORVASC) 10 MG tablet Take 1 tablet (10 mg total) by mouth every evening. 90 tablet 3  ? aspirin EC 81 MG tablet Take 81 mg by mouth every evening.     ? clopidogrel (PLAVIX) 75 MG tablet Take 1 tablet (75 mg total) by mouth daily. 90 tablet 3  ? Elastic Bandages & Supports (MEDICAL COMPRESSION SOCKS) MISC 1 each by Does not apply route daily. 2 each 1  ? famotidine (PEPCID) 20 MG tablet Take 1 tablet (20 mg total) by mouth 2 (two) times daily. 60 tablet 0  ? glipiZIDE (GLUCOTROL XL) 5 MG 24 hr tablet TAKE 1 TABLET BY MOUTH EVERY DAY WITH BREAKFAST 90 tablet 3  ? lisinopril (ZESTRIL) 10 MG tablet Take 1 tablet (10 mg total) by mouth daily. 90 tablet 3  ? MELATONIN GUMMIES PO Take 1 tablet by mouth at bedtime as needed.    ? metoprolol succinate (TOPROL-XL) 25 MG 24 hr tablet TAKE 1 TABLET(25 MG) BY MOUTH DAILY 90 tablet 2  ? pantoprazole (PROTONIX) 40 MG tablet Take 1 tablet (40 mg total) by mouth daily. 1 tablet 0  ? atorvastatin (LIPITOR) 80 MG tablet Take 1 tablet (80 mg total) by mouth every evening.    ? ?No current  facility-administered medications on file prior to visit.  ? ? ?Allergies  ?Allergen Reactions  ? Citalopram Other (See Comments)  ?  Feels odd  ? Doxycycline Hyclate Nausea And Vomiting  ? Erythromycin Other (See Comments)  ?  Irritated stomach  ? Montelukast Sodium Itching  ? Nitrofurantoin Nausea And Vomiting  ? Sulfa Antibiotics Nausea And Vomiting  ? Tramadol Hcl Other (See Comments)  ?  Feels odd  ? Venlafaxine Hcl Other (See Comments)  ?  Feels odd  ? Amoxicillin-Pot Clavulanate Nausea And Vomiting  ? Clarithromycin Rash  ?   See ER note 02/2017  ? ? ?Past Medical History:  ?Diagnosis Date  ? Anxiety   ? Asthma   ? CVA (cerebral vascular accident) (HCC) 03/2006  ? right occipital, Dr. Thad Ranger  ? Diabetes mellitus   ? GERD (gastroesophageal reflux disease)   ? Hyperlipidemia   ? Hypertension   ? ? ?Past Surgical History:  ?Procedure Laterality Date  ? CARDIOVASCULAR STRESS TEST  1/15  ? myoview. EF 60%  ? CESAREAN SECTION    ? ESOPHAGOGASTRODUODENOSCOPY  05/2005  ? TONSILLECTOMY AND ADENOIDECTOMY    ? ? ?Family History  ?Problem Relation Age of Onset  ? Coronary artery disease Mother   ?  Diabetes Mellitus II Mother   ? Heart attack Mother 32  ? Diabetes Mellitus II Maternal Grandmother   ? ? ?Social History  ? ?Socioeconomic History  ? Marital status: Divorced  ?  Spouse name: Not on file  ? Number of children: 2  ? Years of education: Not on file  ? Highest education level: Not on file  ?Occupational History  ? Occupation: LAB TECH  ?  Employer: [employer]  ?Tobacco Use  ? Smoking status: Never  ?  Passive exposure: Never  ? Smokeless tobacco: Never  ?Vaping Use  ? Vaping Use: Never used  ?Substance and Sexual Activity  ? Alcohol use: No  ?  Alcohol/week: 0.0 standard drinks  ?  Comment: wine occassionally  ? Drug use: No  ? Sexual activity: Yes  ?  Birth control/protection: Condom  ?Other Topics Concern  ? Not on file  ?Social History Narrative  ? Not on file  ? ?Social Determinants of Health  ? ?Financial  Resource Strain: Not on file  ?Food Insecurity: Not on file  ?Transportation Needs: Not on file  ?Physical Activity: Not on file  ?Stress: Not on file  ?Social Connections: Not on file  ?Intimate Partner Violence: Not on file  ? ?Review of Systems ?Sugars are okay--- 140-160 at highest ?Not sleeping that well---not really new ? ?   ?Objective:  ? Physical Exam ?Constitutional:   ?   Appearance: Normal appearance.  ?Cardiovascular:  ?   Rate and Rhythm: Normal rate and regular rhythm.  ?   Heart sounds: No murmur heard. ?  No gallop.  ?Pulmonary:  ?   Effort: Pulmonary effort is normal.  ?   Breath sounds: Normal breath sounds. No wheezing or rales.  ?Musculoskeletal:  ?   Cervical back: Neck supple.  ?   Right lower leg: No edema.  ?   Left lower leg: No edema.  ?Lymphadenopathy:  ?   Cervical: No cervical adenopathy.  ?Neurological:  ?   Mental Status: She is alert.  ?  ? ? ? ? ?   ?Assessment & Plan:  ? ?

## 2021-06-23 NOTE — Assessment & Plan Note (Addendum)
Lab Results  ?Component Value Date  ? HGBA1C 7.2 (A) 05/15/2021  ? ?On glipizide 5 ?Discussed adding SGLT-2 inhibitor---she is willing to try ?Would cut the glipizide if any low sugars ?

## 2021-08-31 ENCOUNTER — Telehealth: Payer: Self-pay

## 2021-08-31 NOTE — Telephone Encounter (Signed)
[facility] Night - Client Nonclinical Telephone Record  [facility] Client [facility] Night - Client Client Site  [facility] [place] - Night Provider Tillman Abide- MD Contact Type Call Who Is Calling Patient / Member / Family / Caregiver Caller Name Declined to provide Caller Phone Number [phone] Patient Name Tami Jones Patient DOB 1955/07/22 Call Type Message Only Information Provided Reason for Call Request to Speak to a Physician Initial Comment Caller states would like to know the on-call doctor is and speak to them due to having bronchitis and needing a prescription refilled. Additional Comment Office hours provided. Disp. Time Disposition Final User 08/28/2021 6:45:20 PM General Information Provided Yes Tracie Harrier Call Closed By: Tracie Harrier Transaction Date/Time: 08/28/2021 6:40:44 PM (ET

## 2021-08-31 NOTE — Telephone Encounter (Signed)
I was unable to speak with pt and could not leave a v/m due to mailbox being full. Sending note to lsc triage and [facility] CMA.

## 2021-08-31 NOTE — Telephone Encounter (Signed)
Still no answer and v/m mailbox is full.

## 2021-08-31 NOTE — Telephone Encounter (Signed)
I sent  a MyChart message. Hopefully she will see that we called or she gets the MyChart message.

## 2021-09-01 NOTE — Telephone Encounter (Signed)
Unable to reach pt by phone and could not leave a v/m due to mailbox being full.

## 2021-09-03 NOTE — Telephone Encounter (Signed)
Called pt. VM is still full and could not leave a message. We have tried calling 3 times and a MyChart message was sent to pt. Hopefully she will call us back

## 2021-09-04 NOTE — Telephone Encounter (Signed)
I called and spoke with pt; pt said she did get one my chart note and tried to call office but thought would be on phone too long and pt hung up. Pt did not acknowledge that her voice mailbox is full. Pt said for several days has chest congestion and last 2 days has wheezing. Pt had walked from one room to another to answer the phone and pt was out of breath. Pt sounded SOB while talking and pt said she was almost gasping for breath. Pt said she has no fever and no cough now. Pt said she is sure she does not have covid even though she has not tested. Pt does not want to go to ED or UC and Carollee Herter CMA was going to talk with DR Alphonsus Sias to see about adding pt and pt said she does not have transportation now and she will get someone to take her to [facility] later today. UC & ED precautions given and pt voiced understanding. Sending note to Dr Alphonsus Sias.

## 2021-09-08 NOTE — Telephone Encounter (Signed)
Unable to reach pt by phone and voice mailbox is full so could not leave message.sending note to lsc triage and [facility] CMA.

## 2021-09-09 NOTE — Telephone Encounter (Signed)
Okay I am glad to hear she is feeling better

## 2021-09-09 NOTE — Telephone Encounter (Signed)
Unable to reach pt by phone and voice mailbox is still full. I did speak with Judeth Cornfield (DPR signed) and Judeth Cornfield said pt was with her; I spoke with pt and she did not go to an UC on 09/04/21 and pt is feeling a lot better now; pt stated no SOB now and pt is feeling a lot better and pt stated she does not need appt and pt will contact [facility] if needed. Sending note to Dr Alphonsus Sias and Carollee Herter CMA as Lorain Childes.

## 2021-10-06 DIAGNOSIS — E119 Type 2 diabetes mellitus without complications: Secondary | ICD-10-CM | POA: Diagnosis not present

## 2021-10-06 DIAGNOSIS — H524 Presbyopia: Secondary | ICD-10-CM | POA: Diagnosis not present

## 2021-10-26 ENCOUNTER — Telehealth: Payer: Self-pay | Admitting: Internal Medicine

## 2021-10-26 NOTE — Telephone Encounter (Signed)
Pt called in stated she is all out of RX    Encourage patient to contact the pharmacy for refills or they can request refills through [facility]  LAST APPOINTMENT DATE:  Please schedule appointment if longer than 1 year  NEXT APPOINTMENT DATE:  MEDICATION:clopidogrel (PLAVIX) 75 MG tablet  Is the patient out of medication?   [id number] [place] [place] - [address] [facility]  Let patient know to contact pharmacy at the end of the day to make sure medication is ready.  Please notify patient to allow 48-72 hours to process  CLINICAL FILLS OUT ALL BELOW:   LAST REFILL:  QTY:  REFILL DATE:    OTHER COMMENTS:    Okay for refill?  Please advise

## 2021-10-27 NOTE — Telephone Encounter (Signed)
Tried to contact pt but VM is full. Rx for clopidogrel was sent to [facility] 02[facility]24-[facility] and 3 refills. She needs to speak to someone at the pharmacy.

## 2021-11-04 ENCOUNTER — Telehealth: Payer: Self-pay | Admitting: Internal Medicine

## 2021-11-04 NOTE — Telephone Encounter (Signed)
Pt dropped off forms from Disability that she needs filled out by Dr. Silvio Pate. Pt requested call back once forms are completed @ [phone]. Forms will be pcp's folder.

## 2021-11-05 NOTE — Telephone Encounter (Signed)
Tried to contact pt to let her know the form was ready and there is a $29 charge. VM was full so I could not leave a message. Form put up front for her to pickup.

## 2021-11-05 NOTE — Telephone Encounter (Signed)
Thank you :)

## 2021-11-05 NOTE — Telephone Encounter (Signed)
Patient called back,I let her know that her forms are ready to be picked up per your message.

## 2021-11-27 ENCOUNTER — Ambulatory Visit (INDEPENDENT_AMBULATORY_CARE_PROVIDER_SITE_OTHER): Payer: Medicare HMO | Admitting: Family

## 2021-11-27 ENCOUNTER — Other Ambulatory Visit: Payer: Self-pay | Admitting: Internal Medicine

## 2021-11-27 ENCOUNTER — Encounter: Payer: Self-pay | Admitting: Family

## 2021-11-27 VITALS — BP 142/90 | HR 82 | Temp 97.8°F | Resp 16 | Ht 63.0 in | Wt 241.2 lb

## 2021-11-27 DIAGNOSIS — J020 Streptococcal pharyngitis: Secondary | ICD-10-CM | POA: Insufficient documentation

## 2021-11-27 DIAGNOSIS — J029 Acute pharyngitis, unspecified: Secondary | ICD-10-CM | POA: Insufficient documentation

## 2021-11-27 DIAGNOSIS — Z20822 Contact with and (suspected) exposure to covid-19: Secondary | ICD-10-CM | POA: Diagnosis not present

## 2021-11-27 LAB — POCT RAPID STREP A (OFFICE): Rapid Strep A Screen: POSITIVE — AB

## 2021-11-27 LAB — POC COVID19 BINAXNOW: SARS Coronavirus 2 Ag: NEGATIVE

## 2021-11-27 MED ORDER — AMOXICILLIN 500 MG PO TABS: 500.0000 mg | ORAL_TABLET | Freq: Two times a day (BID) | ORAL | 0 refills | Status: AC

## 2021-11-27 NOTE — Progress Notes (Signed)
Established Patient Office Visit  Subjective:  Patient ID: Tami Jones, female    DOB: 1955/06/28  Age: 66 y.o. MRN: [id number]  CC:  Chief Complaint  Patient presents with   Fever   Cough    Pt states she does not have covid   Sore Throat   Ear Pain    X 1     HPI Tami Jones is here today with concerns.   Two weeks ago ago started with flu like symptoms, and then felt bad for about one week, and then states six days ago started with chills, fever, and low back pain. Also with sore throat and ear pain. Feeling some chest congestion, and pain in the back of her lungs as well. Hurts worse when she coughs at times.   Did have a fever, last two nights ago, didn't check her temp but she felt very hot.  Drinking a good amount of water Did use tylenol with her fever otherwise has not taken anything over the counter.   Past Medical History:  Diagnosis Date   Anxiety    Asthma    CVA (cerebral vascular accident) ([place]) 03/2006   right occipital, Dr. Doy Mince   Diabetes mellitus    GERD (gastroesophageal reflux disease)    Hyperlipidemia    Hypertension     Past Surgical History:  Procedure Laterality Date   CARDIOVASCULAR STRESS TEST  1/15   myoview. EF 60%   CESAREAN SECTION     ESOPHAGOGASTRODUODENOSCOPY  05/2005   TONSILLECTOMY AND ADENOIDECTOMY      Family History  Problem Relation Age of Onset   Coronary artery disease Mother    Diabetes Mellitus II Mother    Heart attack Mother 18   Diabetes Mellitus II Maternal Grandmother     Social History   Socioeconomic History   Marital status: Divorced    Spouse name: Not on file   Number of children: 2   Years of education: Not on file   Highest education level: Not on file  Occupational History   Occupation: LAB TECH    Employer: [employer]  Tobacco Use   Smoking status: Never    Passive exposure: Never   Smokeless tobacco: Never  Vaping Use   Vaping Use: Never used  Substance and Sexual Activity    Alcohol use: No    Alcohol/week: 0.0 standard drinks of alcohol    Comment: wine occassionally   Drug use: No   Sexual activity: Yes    Birth control/protection: Condom  Other Topics Concern   Not on file  Social History Narrative   Not on file   Social Determinants of Health   Financial Resource Strain: Not on file  Food Insecurity: Not on file  Transportation Needs: Not on file  Physical Activity: Not on file  Stress: Not on file  Social Connections: Not on file  Intimate Partner Violence: Not on file    Outpatient Medications Prior to Visit  Medication Sig Dispense Refill   acetaminophen (TYLENOL) 325 MG tablet Take 650 mg by mouth every 6 (six) hours as needed.     albuterol (VENTOLIN HFA) 108 (90 Base) MCG/ACT inhaler Inhale 2 puffs into the lungs every 6 (six) hours as needed for wheezing or shortness of breath. 8 g 2   ALPRAZolam (XANAX) 0.25 MG tablet Take 1 tablet (0.25 mg total) by mouth 3 (three) times daily as needed for anxiety or sleep. 30 tablet 0   amLODipine (NORVASC) 10 MG tablet  Take 1 tablet (10 mg total) by mouth every evening. 90 tablet 3   aspirin EC 81 MG tablet Take 81 mg by mouth every evening.      atorvastatin (LIPITOR) 80 MG tablet Take 1 tablet (80 mg total) by mouth every evening. 90 tablet 3   clopidogrel (PLAVIX) 75 MG tablet Take 1 tablet (75 mg total) by mouth daily. 90 tablet 3   dapagliflozin propanediol (FARXIGA) 5 MG TABS tablet Take 1 tablet (5 mg total) by mouth daily before breakfast. 90 tablet 3   Elastic Bandages & Supports (MEDICAL COMPRESSION SOCKS) MISC 1 each by Does not apply route daily. 2 each 1   famotidine (PEPCID) 20 MG tablet Take 1 tablet (20 mg total) by mouth 2 (two) times daily. 60 tablet 0   glipiZIDE (GLUCOTROL XL) 5 MG 24 hr tablet TAKE 1 TABLET BY MOUTH EVERY DAY WITH BREAKFAST 90 tablet 3   lisinopril (ZESTRIL) 10 MG tablet Take 1 tablet (10 mg total) by mouth daily. 90 tablet 3   MELATONIN GUMMIES PO Take 1 tablet by  mouth at bedtime as needed.     metoprolol succinate (TOPROL-XL) 25 MG 24 hr tablet TAKE 1 TABLET(25 MG) BY MOUTH DAILY 90 tablet 2   pantoprazole (PROTONIX) 40 MG tablet Take 1 tablet (40 mg total) by mouth daily. 90 tablet 3   No facility-administered medications prior to visit.    Allergies  Allergen Reactions   Citalopram Other (See Comments)    Feels odd   Doxycycline Hyclate Nausea And Vomiting   Erythromycin Other (See Comments)    Irritated stomach   Montelukast Sodium Itching   Nitrofurantoin Nausea And Vomiting   Sulfa Antibiotics Nausea And Vomiting   Tramadol Hcl Other (See Comments)    Feels odd   Venlafaxine Hcl Other (See Comments)    Feels odd   Amoxicillin-Pot Clavulanate Nausea And Vomiting   Clarithromycin Rash     See ER note 02/2017        Objective:    Physical Exam Constitutional:      General: She is not in acute distress.    Appearance: Normal appearance. She is well-developed. She is obese. She is not ill-appearing.  HENT:     Right Ear: Tympanic membrane normal.     Left Ear: Tympanic membrane normal.     Nose: Mucosal edema and congestion present. No rhinorrhea.     Right Turbinates: Enlarged. Not swollen.     Left Turbinates: Enlarged. Not swollen.     Right Sinus: Maxillary sinus tenderness and frontal sinus tenderness present.     Left Sinus: Maxillary sinus tenderness and frontal sinus tenderness present.     Mouth/Throat:     Mouth: Mucous membranes are moist.     Pharynx: Posterior oropharyngeal erythema present. No pharyngeal swelling or oropharyngeal exudate.     Tonsils: No tonsillar exudate.  Eyes:     Extraocular Movements: Extraocular movements intact.     Conjunctiva/sclera: Conjunctivae normal.     Pupils: Pupils are equal, round, and reactive to light.  Neck:     Thyroid: No thyroid mass.  Cardiovascular:     Rate and Rhythm: Normal rate and regular rhythm.  Pulmonary:     Effort: Pulmonary effort is normal.     Breath  sounds: Normal breath sounds.  Lymphadenopathy:     Cervical: Cervical adenopathy present.     Right cervical: Superficial cervical adenopathy present.     Left cervical: Superficial cervical adenopathy present.  Neurological:     Mental Status: She is alert.     BP (!) 142/90   Pulse 82   Temp 97.8 F (36.6 C)   Resp 16   Ht 5\' 3"  (1.6 m)   Wt 241 lb 4 oz (109.4 kg)   SpO2 98%   BMI 42.74 kg/m  Wt Readings from Last 3 Encounters:  11/27/21 241 lb 4 oz (109.4 kg)  06/23/21 249 lb (112.9 kg)  05/15/21 246 lb (111.6 kg)     Health Maintenance Due  Topic Date Due   Hepatitis C Screening  Never done   COLONOSCOPY (Pts 45-110yrs Insurance coverage will need to be confirmed)  Never done   Zoster Vaccines- Shingrix (1 of 2) Never done   MAMMOGRAM  06/01/2012   Diabetic kidney evaluation - Urine ACR  02/17/2018   COVID-19 Vaccine (3 - Pfizer series) 08/14/2019   OPHTHALMOLOGY EXAM  10/24/2019   PAP SMEAR-Modifier  04/08/2020   INFLUENZA VACCINE  09/15/2021   HEMOGLOBIN A1C  11/14/2021    There are no preventive care reminders to display for this patient.  Lab Results  Component Value Date   TSH 3.380 11/27/2019   Lab Results  Component Value Date   WBC 11.9 (H) 02/25/2021   HGB 14.3 02/25/2021   HCT 43.1 02/25/2021   MCV 90.0 02/25/2021   PLT 246 02/25/2021   Lab Results  Component Value Date   NA 138 06/23/2021   K 4.1 06/23/2021   CO2 27 06/23/2021   GLUCOSE 149 (H) 06/23/2021   BUN 17 06/23/2021   CREATININE 1.03 06/23/2021   BILITOT 0.6 02/25/2021   ALKPHOS 82 02/25/2021   AST 25 02/25/2021   ALT 24 02/25/2021   PROT 7.9 02/25/2021   ALBUMIN 4.1 06/23/2021   CALCIUM 9.1 06/23/2021   ANIONGAP 10 02/25/2021   GFR 57.46 (L) 06/23/2021   Lab Results  Component Value Date   HGBA1C 7.2 (A) 05/15/2021      Assessment & Plan:   Problem List Items Addressed This Visit       Respiratory   Strep pharyngitis    Strep tested positive in office.  rx  for amox 500 mg po bid x 10 days.  Ibuprofen/tyelnol prn sore throat/fever Pt told to F/u if no improvement in the next 2-3 days.   covid tested in office, negative.       Relevant Medications   amoxicillin (AMOXIL) 500 MG tablet     Other   RESOLVED: Suspected COVID-19 virus infection   Relevant Orders   POC COVID-19 BinaxNow (Completed)   RESOLVED: Sore throat - Primary   Relevant Medications   amoxicillin (AMOXIL) 500 MG tablet   Other Relevant Orders   POCT rapid strep A (Completed)    Meds ordered this encounter  Medications   amoxicillin (AMOXIL) 500 MG tablet    Sig: Take 1 tablet (500 mg total) by mouth 2 (two) times daily for 10 days.    Dispense:  20 tablet    Refill:  0    Can not tolerate the tablets with pink coating, please ensure no pink coating. Can not have capsules. Tolerates amox just not augmentin    Order Specific Question:   Supervising Provider    Answer:   05/17/2021, AMY E [[id number]]    Follow-up: Return for f/u with primary care provider if no improvement.    Ermalene Searing, FNP

## 2021-11-27 NOTE — Patient Instructions (Signed)
You were found to be strep positive,  Take antibiotics that have been sent to the pharmacy.  Change your toothbrush after 24 hours on the antibiotics.  Gargle with warm salt water as needed for sore throat.   Due to recent changes in healthcare laws, you may see results of your imaging and/or laboratory studies on MyChart before I have had a chance to review them.  I understand that in some cases there may be results that are confusing or concerning to you. Please understand that not all results are received at the same time and often I may need to interpret multiple results in order to provide you with the best plan of care or course of treatment. Therefore, I ask that you please give me 2 business days to thoroughly review all your results before contacting my office for clarification. Should we see a critical lab result, you will be contacted sooner.   It was a pleasure seeing you today! Please do not hesitate to reach out with any questions and or concerns.  Regards,   Lafe Clerk FNP-C    

## 2021-11-27 NOTE — Assessment & Plan Note (Signed)
Strep tested positive in office.  rx for amox 500 mg po bid x 10 days.  Ibuprofen/tyelnol prn sore throat/fever Pt told to F/u if no improvement in the next 2-3 days.   covid tested in office, negative.

## 2021-12-28 ENCOUNTER — Ambulatory Visit (INDEPENDENT_AMBULATORY_CARE_PROVIDER_SITE_OTHER): Payer: Medicare HMO | Admitting: Internal Medicine

## 2021-12-28 ENCOUNTER — Encounter: Payer: Self-pay | Admitting: Internal Medicine

## 2021-12-28 VITALS — BP 138/88 | HR 50 | Temp 97.3°F | Ht 63.0 in | Wt 242.0 lb

## 2021-12-28 DIAGNOSIS — G40909 Epilepsy, unspecified, not intractable, without status epilepticus: Secondary | ICD-10-CM | POA: Insufficient documentation

## 2021-12-28 DIAGNOSIS — Z23 Encounter for immunization: Secondary | ICD-10-CM

## 2021-12-28 DIAGNOSIS — E1152 Type 2 diabetes mellitus with diabetic peripheral angiopathy with gangrene: Secondary | ICD-10-CM | POA: Diagnosis not present

## 2021-12-28 DIAGNOSIS — I69351 Hemiplegia and hemiparesis following cerebral infarction affecting right dominant side: Secondary | ICD-10-CM

## 2021-12-28 DIAGNOSIS — R1011 Right upper quadrant pain: Secondary | ICD-10-CM

## 2021-12-28 DIAGNOSIS — E1159 Type 2 diabetes mellitus with other circulatory complications: Secondary | ICD-10-CM

## 2021-12-28 DIAGNOSIS — I10 Essential (primary) hypertension: Secondary | ICD-10-CM

## 2021-12-28 DIAGNOSIS — R69 Illness, unspecified: Secondary | ICD-10-CM | POA: Diagnosis not present

## 2021-12-28 DIAGNOSIS — Z1211 Encounter for screening for malignant neoplasm of colon: Secondary | ICD-10-CM | POA: Diagnosis not present

## 2021-12-28 DIAGNOSIS — Z0000 Encounter for general adult medical examination without abnormal findings: Secondary | ICD-10-CM

## 2021-12-28 DIAGNOSIS — N1831 Chronic kidney disease, stage 3a: Secondary | ICD-10-CM | POA: Diagnosis not present

## 2021-12-28 DIAGNOSIS — F39 Unspecified mood [affective] disorder: Secondary | ICD-10-CM

## 2021-12-28 LAB — HEPATIC FUNCTION PANEL
ALT: 22 U/L (ref 0–35)
AST: 21 U/L (ref 0–37)
Albumin: 4.1 g/dL (ref 3.5–5.2)
Alkaline Phosphatase: 76 U/L (ref 39–117)
Bilirubin, Direct: 0.1 mg/dL (ref 0.0–0.3)
Total Bilirubin: 0.6 mg/dL (ref 0.2–1.2)
Total Protein: 6.7 g/dL (ref 6.0–8.3)

## 2021-12-28 LAB — CBC
HCT: 40.6 % (ref 36.0–46.0)
Hemoglobin: 13.5 g/dL (ref 12.0–15.0)
MCHC: 33.3 g/dL (ref 30.0–36.0)
MCV: 92.3 fl (ref 78.0–100.0)
Platelets: 222 10*3/uL (ref 150.0–400.0)
RBC: 4.4 Mil/uL (ref 3.87–5.11)
RDW: 13.3 % (ref 11.5–15.5)
WBC: 5.8 10*3/uL (ref 4.0–10.5)

## 2021-12-28 LAB — RENAL FUNCTION PANEL
Albumin: 4.1 g/dL (ref 3.5–5.2)
BUN: 13 mg/dL (ref 6–23)
CO2: 27 mEq/L (ref 19–32)
Calcium: 9.1 mg/dL (ref 8.4–10.5)
Chloride: 103 mEq/L (ref 96–112)
Creatinine, Ser: 1.03 mg/dL (ref 0.40–1.20)
GFR: 57.26 mL/min — ABNORMAL LOW (ref >60.00)
Glucose, Bld: 169 mg/dL — ABNORMAL HIGH (ref 70–99)
Phosphorus: 3.3 mg/dL (ref 2.3–4.6)
Potassium: 4.4 mEq/L (ref 3.5–5.1)
Sodium: 139 mEq/L (ref 135–145)

## 2021-12-28 LAB — LIPID PANEL
Cholesterol: 237 mg/dL — ABNORMAL HIGH (ref 0–200)
HDL: 38.4 mg/dL — ABNORMAL LOW (ref >39.00)
LDL Cholesterol: 169 mg/dL — ABNORMAL HIGH (ref 0–99)
NonHDL: 198.68
Total CHOL/HDL Ratio: 6
Triglycerides: 147 mg/dL (ref 0.0–149.0)
VLDL: 29.4 mg/dL (ref 0.0–40.0)

## 2021-12-28 LAB — POCT GLYCOSYLATED HEMOGLOBIN (HGB A1C): Hemoglobin A1C: 8 % — AB (ref 4.0–5.6)

## 2021-12-28 LAB — TSH: TSH: 1.82 u[IU]/mL (ref 0.35–5.50)

## 2021-12-28 LAB — HM DIABETES FOOT EXAM

## 2021-12-28 MED ORDER — GLIPIZIDE ER 5 MG PO TB24: 5.0000 mg | ORAL_TABLET | Freq: Two times a day (BID) | ORAL | 3 refills | Status: DC

## 2021-12-28 NOTE — Assessment & Plan Note (Signed)
None in a long time on lamictal 100 bid

## 2021-12-28 NOTE — Assessment & Plan Note (Signed)
Is on lisinopril 10

## 2021-12-28 NOTE — Assessment & Plan Note (Signed)
Has pleuritic component but seems more abdominal Will check RUQ ultrasound

## 2021-12-28 NOTE — Assessment & Plan Note (Signed)
Anxiety with panic better----rare alprazolam Chronic mostly reactive depression----advised getting out-----gym, etc Has not done well with miultiple meds

## 2021-12-28 NOTE — Assessment & Plan Note (Addendum)
BP Readings from Last 3 Encounters:  12/28/21 138/88  11/27/21 (!) 142/90  06/23/21 130/86   Controlled on metoprolol 25mg , lisinopril 10, amlodipine 10 Will check labs

## 2021-12-28 NOTE — Addendum Note (Signed)
Addended by: Eual Fines on: 12/28/2021 10:57 AM   Modules accepted: Orders

## 2021-12-28 NOTE — Assessment & Plan Note (Signed)
I have personally reviewed the Medicare Annual Wellness questionnaire and have noted 1. The patient's medical and social history 2. Their use of alcohol, tobacco or illicit drugs 3. Their current medications and supplements 4. The patient's functional ability including ADL's, fall risks, home safety risks and hearing or visual             impairment. 5. Diet and physical activities 6. Evidence for depression or mood disorders  The patients weight, height, BMI and visual acuity have been recorded in the chart I have made referrals, counseling and provided education to the patient based review of the above and I have provided the pt with a written personalized care plan for preventive services.  I have provided you with a copy of your personalized plan for preventive services. Please take the time to review along with your updated medication list.  Prefers FIT to colon Due for [facility] her to set up No pap now Discussed exercise--back to gym?? Prevnar 20/flu vaccines today COVID and shingrix at pharmacy

## 2021-12-28 NOTE — Patient Instructions (Signed)
Please set up your screening mammogram. 

## 2021-12-28 NOTE — Progress Notes (Signed)
Subjective:    Patient ID: Tami Jones, female    DOB: [birth year], 66 y.o.   MRN: [id number]  HPI Here for Medicare wellness visit and follow up of chronic health conditions Reviewed advanced directives Reviewed other doctors---Dr Serafina Mitchell doctor, [place] [facility] ([facility]) No hospitalizations or surgery this year Vision is okay in left---has cataract in right--will see surgeon Hearing is okay No alcohol or tobacco Walks a little--not much One fall Chronic depression Daughter mostly shops --but she does housework (dishes, Pharmacologist, etc). Daughter cooks mostly Forgetful---but no functional changes  Having bad RUQ pain that goes to her back Goes back to when she had respiratory illness Slight cough---no fever Breathing is okay---unless she lies down fully (slight) Not related to eating Some pleuritic component Heating pad hurts slightly No rash  Couldn't afford the farxiga Eating about the same  Some chest pain with the cold--not before Some palpitations---had recent panic attack (first in 3 months) Uses the alprazolam prn only Still depressed--every day. Tough being home when alone (everyone doing things) No suicidal ideation Does enjoy her grandkids---sees them daily (they live with her)  Still some right side weakness Did have 1 fall--right leg gave out No major injury Has had other stumbles without falling Walks with cane Some dizziness---especially after bending and getting up. No syncope  GFR 57  Current Outpatient Medications on File Prior to Visit  Medication Sig Dispense Refill   acetaminophen (TYLENOL) 325 MG tablet Take 650 mg by mouth every 6 (six) hours as needed.     albuterol (VENTOLIN HFA) 108 (90 Base) MCG/ACT inhaler Inhale 2 puffs into the lungs every 6 (six) hours as needed for wheezing or shortness of breath. 8 g 2   ALPRAZolam (XANAX) 0.25 MG tablet Take 1 tablet (0.25 mg total) by mouth 3 (three) times daily as needed for anxiety or  sleep. 30 tablet 0   amLODipine (NORVASC) 10 MG tablet Take 1 tablet (10 mg total) by mouth every evening. 90 tablet 3   aspirin EC 81 MG tablet Take 81 mg by mouth every evening.      atorvastatin (LIPITOR) 80 MG tablet Take 1 tablet (80 mg total) by mouth every evening. 90 tablet 3   clopidogrel (PLAVIX) 75 MG tablet Take 1 tablet (75 mg total) by mouth daily. 90 tablet 3   glipiZIDE (GLUCOTROL XL) 5 MG 24 hr tablet TAKE 1 TABLET BY MOUTH EVERY DAY WITH BREAKFAST 90 tablet 3   metoprolol succinate (TOPROL-XL) 25 MG 24 hr tablet TAKE 1 TABLET BY MOUTH DAILY 90 tablet 0   pantoprazole (PROTONIX) 40 MG tablet Take 1 tablet (40 mg total) by mouth daily. 90 tablet 3   No current facility-administered medications on file prior to visit.    Allergies  Allergen Reactions   Citalopram Other (See Comments)    Feels odd   Doxycycline Hyclate Nausea And Vomiting   Erythromycin Other (See Comments)    Irritated stomach   Montelukast Sodium Itching   Nitrofurantoin Nausea And Vomiting   Sulfa Antibiotics Nausea And Vomiting   Tramadol Hcl Other (See Comments)    Feels odd   Venlafaxine Hcl Other (See Comments)    Feels odd   Amoxicillin-Pot Clavulanate Nausea And Vomiting   Clarithromycin Rash     See ER note 02/2017    Past Medical History:  Diagnosis Date   Anxiety    Asthma    CVA (cerebral vascular accident) ([facility]) 03/2006   right occipital, Dr. Thad Ranger  Diabetes mellitus    GERD (gastroesophageal reflux disease)    Hyperlipidemia    Hypertension     Past Surgical History:  Procedure Laterality Date   CARDIOVASCULAR STRESS TEST  1/15   myoview. EF 60%   CESAREAN SECTION     ESOPHAGOGASTRODUODENOSCOPY  05/2005   TONSILLECTOMY AND ADENOIDECTOMY      Family History  Problem Relation Age of Onset   Coronary artery disease Mother    Diabetes Mellitus II Mother    Heart attack Mother 19   Diabetes Mellitus II Maternal Grandmother     Social History   Socioeconomic  History   Marital status: Divorced    Spouse name: Not on file   Number of children: 2   Years of education: Not on file   Highest education level: Not on file  Occupational History   Occupation: LAB TECH    Employer: [employer]  Tobacco Use   Smoking status: Never    Passive exposure: Never   Smokeless tobacco: Never  Vaping Use   Vaping Use: Never used  Substance and Sexual Activity   Alcohol use: No    Alcohol/week: 0.0 standard drinks of alcohol    Comment: wine occassionally   Drug use: No   Sexual activity: Yes    Birth control/protection: Condom  Other Topics Concern   Not on file  Social History Narrative   No living will   Daughter should be health care decision maker   Would accept resuscitation   No tube feeds if cognitively aware   Social Determinants of Health   Financial Resource Strain: Not on file  Food Insecurity: Not on file  Transportation Needs: Not on file  Physical Activity: Not on file  Stress: Not on file  Social Connections: Not on file  Intimate Partner Violence: Not on file    Review of Systems Appetite not that good since recent illness Weight is fairly stable--but BMI is still 42 Not sleeping well Wears seat belt Trouble with teeth---needs some extractions/replacements. Needs dentist Has spot on left calf to be checked On protonix--occ indigestion. No extra meds. No dysphagia---just chronic throat symptoms Bowels move fine Constant joint pains--tylenol some help     Objective:   Physical Exam Constitutional:      Appearance: Normal appearance.  HENT:     Mouth/Throat:     Comments: No oral lesions Eyes:     Conjunctiva/sclera: Conjunctivae normal.     Pupils: Pupils are equal, round, and reactive to light.  Cardiovascular:     Rate and Rhythm: Normal rate and regular rhythm.     Pulses: Normal pulses.     Heart sounds: No murmur heard.    No gallop.  Pulmonary:     Effort: Pulmonary effort is normal.     Breath sounds:  Normal breath sounds. No wheezing or rales.  Abdominal:     Palpations: Abdomen is soft.     Tenderness: There is no guarding or rebound.     Comments: RUQ tenderness and positive Murphy's  Musculoskeletal:     Cervical back: Neck supple.     Right lower leg: No edema.     Left lower leg: No edema.  Lymphadenopathy:     Cervical: No cervical adenopathy.  Skin:    Findings: No rash.     Comments: No foot lesions Nodule on posterior left calf---recommended derm evaluation  Neurological:     Mental Status: She is alert and oriented to person, place, and time.  Comments: Mild right side weakness Normal sensation in feet Mini-cog okay---clock good, recall 2/3            Assessment & Plan:

## 2021-12-28 NOTE — Assessment & Plan Note (Signed)
Discussed increased exercise and care with eating Financially GLP-1 not feasible

## 2021-12-28 NOTE — Assessment & Plan Note (Addendum)
Discussed exercise--going back to the gym Walks with cane On BP meds, clopidogrel 75, atorvastatin 80

## 2021-12-28 NOTE — Assessment & Plan Note (Signed)
Lab Results  Component Value Date   HGBA1C 8.0 (A) 12/28/2021   Worse now Couldn't afford farxiga Will increase the glipizide to 5 bid

## 2021-12-29 ENCOUNTER — Encounter: Payer: Self-pay | Admitting: Internal Medicine

## 2021-12-29 DIAGNOSIS — R1011 Right upper quadrant pain: Secondary | ICD-10-CM

## 2021-12-29 LAB — MICROALBUMIN / CREATININE URINE RATIO
Creatinine,U: 207.1 mg/dL
Microalb Creat Ratio: 1 mg/g (ref 0.0–30.0)
Microalb, Ur: 2.1 mg/dL — ABNORMAL HIGH (ref 0.0–1.9)

## 2022-01-05 ENCOUNTER — Ambulatory Visit
Admission: RE | Admit: 2022-01-05 | Discharge: 2022-01-05 | Disposition: A | Discharge status: Home / Self Care | Payer: Medicare HMO | Source: Ambulatory Visit | Attending: Internal Medicine | Admitting: Internal Medicine

## 2022-01-05 DIAGNOSIS — R112 Nausea with vomiting, unspecified: Secondary | ICD-10-CM | POA: Diagnosis not present

## 2022-01-05 DIAGNOSIS — R1011 Right upper quadrant pain: Secondary | ICD-10-CM | POA: Insufficient documentation

## 2022-01-05 DIAGNOSIS — K8020 Calculus of gallbladder without cholecystitis without obstruction: Secondary | ICD-10-CM | POA: Diagnosis not present

## 2022-01-13 ENCOUNTER — Ambulatory Visit
Admission: EM | Admit: 2022-01-13 | Discharge: 2022-01-13 | Disposition: A | Discharge status: Home / Self Care | Payer: Medicare HMO | Attending: Urgent Care | Admitting: Urgent Care

## 2022-01-13 DIAGNOSIS — R300 Dysuria: Secondary | ICD-10-CM | POA: Diagnosis not present

## 2022-01-13 LAB — POCT URINALYSIS DIP (MANUAL ENTRY)
Bilirubin, UA: NEGATIVE
Blood, UA: NEGATIVE
Glucose, UA: NEGATIVE mg/dL
Ketones, POC UA: NEGATIVE mg/dL
Nitrite, UA: NEGATIVE
Protein Ur, POC: NEGATIVE mg/dL
Spec Grav, UA: 1.025 (ref 1.010–1.025)
Urobilinogen, UA: 0.2 E.U./dL
pH, UA: 6 (ref 5.0–8.0)

## 2022-01-13 MED ORDER — CEPHALEXIN 500 MG PO CAPS: 500.0000 mg | ORAL_CAPSULE | Freq: Four times a day (QID) | ORAL | 0 refills | Status: AC

## 2022-01-13 NOTE — Discharge Instructions (Signed)
We are sending a sample of your urine to the lab for culture.  Follow up here or with your primary care provider if your symptoms are worsening or not improving with treatment.

## 2022-01-13 NOTE — ED Provider Notes (Signed)
[name]    CSN: [id number] Arrival date & time: 01/13/22  1751      History   Chief Complaint Chief Complaint  Patient presents with   Dysuria   Urinary Frequency    HPI Tami Jones is a 66 y.o. female.    Dysuria Urinary Frequency    Presents to urgent care with complaint of dysuria, urinary frequency and urgency starting this morning.  Denies fever, myalgias, chills.  Denies back pain.  Denies abdominal pain.  Denies vaginal discharge.  Past Medical History:  Diagnosis Date   Anxiety    Asthma    CVA (cerebral vascular accident) (HCC) 03/2006   right occipital, Dr. Thad Ranger   Diabetes mellitus    GERD (gastroesophageal reflux disease)    Hyperlipidemia    Hypertension     Patient Active Problem List   Diagnosis Date Noted   RUQ pain 12/28/2021   Morbid obesity (HCC) 12/28/2021   Seizure disorder (HCC) 12/28/2021   History of CVA with residual deficit 08/25/2019   HLD (hyperlipidemia)    Type 2 diabetes mellitus with circulatory disorder, without long-term current use of insulin (HCC)    CKD (chronic kidney disease), stage IIIa    Neuropathic pain, arm 10/26/2018   Hemiparesis of right dominant side (HCC) 08/17/2018   Cerebrovascular disease 07/17/2014   Preventative health care 06/25/2014   Right sided sciatica 03/08/2013   MENOPAUSAL SYNDROME 09/10/2008   Mood disorder (HCC) 09/15/2007   Essential hypertension, benign 08/10/2006   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 EF 60%   CESAREAN SECTION     ESOPHAGOGASTRODUODENOSCOPY  05/2005   TONSILLECTOMY AND ADENOIDECTOMY      OB History     Gravida  2   Para  2   Term  2   Preterm  0   AB  0   Living  2      SAB  0   IAB  0   Ectopic  0   Multiple  0   Live Births                Home Medications    Prior to Admission medications   Medication Sig Start Date End Date Taking? Authorizing Provider  acetaminophen (TYLENOL) 325 MG tablet Take 650 mg by mouth every 6 (six) hours as needed.    [provider]  albuterol (VENTOLIN HFA) 108 (90 Base) MCG/ACT inhaler Inhale 2 puffs into the lungs every 6 (six) hours as needed for wheezing or shortness of breath. 12/18/20   Phineas Semen, MD  ALPRAZolam Prudy Feeler) 0.25 MG tablet Take 1 tablet (0.25 mg total) by mouth 3 (three) times daily as needed for anxiety or sleep. 12/24/20   Karie Schwalbe, MD  amLODipine (NORVASC) 10 MG tablet Take 1 tablet (10 mg total) by mouth every evening. 08/28/19   Alford Highland, MD  aspirin EC 81 MG tablet Take 81 mg by mouth every evening.     [provider]  atorvastatin (LIPITOR) 80 MG tablet Take 1 tablet (80 mg total) by mouth every evening. 06/23/21 06/23/22  Karie Schwalbe, MD  clopidogrel (PLAVIX) 75 MG tablet Take 1 tablet (75 mg total) by mouth daily. 02/24/21   Karie Schwalbe, MD  glipiZIDE (GLUCOTROL XL) 5 MG 24 hr tablet  Take 1 tablet (5 mg total) by mouth in the morning and at bedtime. 12/28/21   Karie Schwalbe, MD  metoprolol succinate (TOPROL-XL) 25 MG 24 hr tablet TAKE 1 TABLET BY MOUTH DAILY 11/30/21   Tillman Abide I, MD  pantoprazole (PROTONIX) 40 MG tablet Take 1 tablet (40 mg total) by mouth daily. 06/23/21   Karie Schwalbe, MD    Family History Family History  Problem Relation Age of Onset   Coronary artery disease Mother    Diabetes Mellitus II Mother    Heart attack Mother 74   Diabetes Mellitus II Maternal Grandmother     Social History Social History   Tobacco Use   Smoking status: Never    Passive exposure: Never   Smokeless tobacco: Never  Vaping Use   Vaping Use: Never used  Substance Use Topics   Alcohol use: No    Alcohol/week: 0.0 standard drinks of alcohol    Comment: wine occassionally   Drug use: No      Allergies   Citalopram, Doxycycline hyclate, Erythromycin, Montelukast sodium, Nitrofurantoin, Sulfa antibiotics, Tramadol hcl, Venlafaxine hcl, Amoxicillin-pot clavulanate, and Clarithromycin   Review of Systems Review of Systems  Genitourinary:  Positive for dysuria and frequency.     Physical Exam Triage Vital Signs ED Triage Vitals [01/13/22 1909]  Enc Vitals Group     BP      Pulse      Resp      Temp      Temp src      SpO2      Weight      Height      Head Circumference      Peak Flow      Pain Score 6     Pain Loc      Pain Edu?      Excl. in GC?    No data found.  Updated Vital Signs There were no vitals taken for this visit.  Visual Acuity Right Eye Distance:   Left Eye Distance:   Bilateral Distance:    Right Eye Near:   Left Eye Near:    Bilateral Near:     Physical Exam Vitals reviewed.  Constitutional:      Appearance: Normal appearance.  Skin:    General: Skin is warm and dry.  Neurological:     General: No focal deficit present.     Mental Status: She is alert and oriented to person, place, and time.  Psychiatric:        Mood and Affect: Mood normal.        Behavior: Behavior normal.      UC Treatments / Results  Labs (all labs ordered are listed, but only abnormal results are displayed) Labs Reviewed  POCT URINALYSIS DIP (MANUAL ENTRY) - Abnormal; Notable for the following components:      Result Value   Leukocytes, UA Trace (*)    All other components within normal limits    EKG   Radiology No results found.  Procedures Procedures (including critical care time)  Medications Ordered in UC Medications - No data to display  Initial Impression / Assessment and Plan / UC Course  I have reviewed the triage vital signs and the nursing notes.  Pertinent labs & imaging results that were available during my care of the patient were reviewed by me and considered in my medical decision making (see chart for  details).   UA is negative for signs of infection however  treating empirically based on patient's description of symptoms.  Will send urine to lab for culture to verify diagnosis.  Treating with cephalexin which she previously has tolerated   Final Clinical Impressions(s) / UC Diagnoses   Final diagnoses:  None   Discharge Instructions   None    ED Prescriptions   None    PDMP not reviewed this encounter.   Charma Igo, [place] 01/13/22 1935

## 2022-01-13 NOTE — ED Triage Notes (Signed)
Pt. Presents to UC w/ c/o dysuria, urinary frequency and urgency that started this morning.

## 2022-01-13 NOTE — ED Notes (Signed)
Patients BP checked 2x by Tiffany (RAD tech) Pt. States she has taken her BP medication today and is being by her PCP for hypertension. Provider Jeannett Senior Immordino is notified and aware of patients abnormal blood pressure. No new orders or interventions put in place by Provider Jeannett Senior Immordino.

## 2022-01-15 ENCOUNTER — Telehealth: Payer: Self-pay | Admitting: Urgent Care

## 2022-01-15 DIAGNOSIS — N3001 Acute cystitis with hematuria: Secondary | ICD-10-CM

## 2022-01-15 DIAGNOSIS — R300 Dysuria: Secondary | ICD-10-CM

## 2022-01-15 LAB — URINE CULTURE: Culture: >=100000 — AB

## 2022-01-15 MED ORDER — CIPROFLOXACIN HCL 250 MG PO TABS: 250.0000 mg | ORAL_TABLET | Freq: Two times a day (BID) | ORAL | 0 refills | Status: AC

## 2022-01-15 NOTE — Telephone Encounter (Signed)
Results of urine culture shows resistance to Keflex.  Patient is allergic to Macrobid and sulfa medications.  [facility] RN requesting provider prescribe alternative treatment.  Will give Cipro.

## 2022-02-04 ENCOUNTER — Telehealth: Payer: Self-pay | Admitting: Internal Medicine

## 2022-02-04 NOTE — Telephone Encounter (Signed)
Patient called and stated what can she take with her meds for a cold she has. Call back number [phone]

## 2022-02-04 NOTE — Telephone Encounter (Signed)
Spoke to pt. She is aware she will need to go somewhere to be seen if she is not improving or breathing gets worse.

## 2022-02-18 ENCOUNTER — Telehealth (INDEPENDENT_AMBULATORY_CARE_PROVIDER_SITE_OTHER): Payer: Medicare HMO | Admitting: Gastroenterology

## 2022-02-18 ENCOUNTER — Encounter: Payer: Self-pay | Admitting: Gastroenterology

## 2022-02-18 DIAGNOSIS — G8929 Other chronic pain: Secondary | ICD-10-CM

## 2022-02-18 DIAGNOSIS — R1011 Right upper quadrant pain: Secondary | ICD-10-CM | POA: Diagnosis not present

## 2022-02-18 DIAGNOSIS — Z1211 Encounter for screening for malignant neoplasm of colon: Secondary | ICD-10-CM

## 2022-02-18 MED ORDER — NA SULFATE-K SULFATE-MG SULF 17.5-3.13-1.6 GM/177ML PO SOLN: 354.0000 mL | Freq: Once | ORAL | 0 refills | Status: AC

## 2022-02-18 NOTE — Progress Notes (Signed)
Sherri Sear, MD [address]  [place]  [place], [place] [address]  Main: [phone]  Fax: [phone]    Gastroenterology Consultation Video Visit  Referring Provider:     Venia Carbon, MD Primary Care Physician:  Venia Carbon, MD Primary Gastroenterologist:  Dr. Cephas Darby Reason for Consultation: Chronic right upper quadrant pain        HPI:   Tami Jones is a 67 y.o. female referred by Dr. Venia Carbon, MD  for consultation & management of chronic right upper quadrant pain  Virtual Visit Video Note  I connected with Tahiri Shareef Moncus on 02/18/22 at  9:30 AM EST by video and verified that I am speaking with the correct person using two identifiers.   I discussed the limitations, risks, security and privacy concerns of performing an evaluation and management service by video and the availability of in person appointments. I also discussed with the patient that there may be a patient responsible charge related to this service. The patient expressed understanding and agreed to proceed.  Location of the Patient: Home  Location of the provider: Home office  Persons participating in the visit: Patient and provider only   History of Present Illness: Tami Jones is a 67 year old female with history of stroke on Plavix, poorly controlled diabetes is seen in consultation for chronic right upper quadrant pain.  Patient reports that she has been experiencing sharp more or less constant right upper quadrant pain, worse after a meal and at night, associated with nausea.  She underwent right upper quadrant ultrasound which revealed cholelithiasis and hepatic steatosis only.  Her LFTs are normal.  Her hemoglobin A1c is 8.  She reports having flu about 3 weeks ago, still having symptoms of cold and recovering very slowly.  She denies any abdominal bloating or epigastric pain or early satiety.  She denies any change in bowel habits.  She denies any rectal  bleeding, melena, hematemesis or coffee-ground emesis.  She is currently on Protonix 40 mg daily She mostly stays at home due to decreased mobility.  Her daughter helps with preparing her meals majority of the time  She used to work in a microbiology lab    NSAIDs: None  Antiplts/Anticoagulants/Anti thrombotics: Plavix for history of stroke  GI Procedures: None  Past Medical History:  Diagnosis Date   Anxiety    Asthma    CVA (cerebral vascular accident) ([place]) 03/2006   right occipital, Dr. Doy Mince   Diabetes mellitus    GERD (gastroesophageal reflux disease)    Hyperlipidemia    Hypertension     Past Surgical History:  Procedure Laterality Date   CARDIOVASCULAR STRESS TEST  1/15   myoview. EF 60%   CESAREAN SECTION     ESOPHAGOGASTRODUODENOSCOPY  05/2005   TONSILLECTOMY AND ADENOIDECTOMY       Current Outpatient Medications:    acetaminophen (TYLENOL) 325 MG tablet, Take 650 mg by mouth every 6 (six) hours as needed., Disp: , Rfl:    albuterol (VENTOLIN HFA) 108 (90 Base) MCG/ACT inhaler, Inhale 2 puffs into the lungs every 6 (six) hours as needed for wheezing or shortness of breath., Disp: 8 g, Rfl: 2   ALPRAZolam (XANAX) 0.25 MG tablet, Take 1 tablet (0.25 mg total) by mouth 3 (three) times daily as needed for anxiety or sleep., Disp: 30 tablet, Rfl: 0   amLODipine (NORVASC) 10 MG tablet, Take 1 tablet (10 mg total) by mouth every evening., Disp: 90  tablet, Rfl: 3   aspirin EC 81 MG tablet, Take 81 mg by mouth every evening. , Disp: , Rfl:    atorvastatin (LIPITOR) 80 MG tablet, Take 1 tablet (80 mg total) by mouth every evening., Disp: 90 tablet, Rfl: 3   clopidogrel (PLAVIX) 75 MG tablet, Take 1 tablet (75 mg total) by mouth daily., Disp: 90 tablet, Rfl: 3   glipiZIDE (GLUCOTROL XL) 5 MG 24 hr tablet, Take 1 tablet (5 mg total) by mouth in the morning and at bedtime., Disp: 180 tablet, Rfl: 3   metoprolol succinate (TOPROL-XL) 25 MG 24 hr tablet, TAKE 1 TABLET BY  MOUTH DAILY, Disp: 90 tablet, Rfl: 0   Na Sulfate-K Sulfate-Mg Sulf 17.5-3.13-1.6 GM/177ML SOLN, Take 354 mLs by mouth once for 1 dose., Disp: 354 mL, Rfl: 0   pantoprazole (PROTONIX) 40 MG tablet, Take 1 tablet (40 mg total) by mouth daily., Disp: 90 tablet, Rfl: 3   Family History  Problem Relation Age of Onset   Coronary artery disease Mother    Diabetes Mellitus II Mother    Heart attack Mother 63   Diabetes Mellitus II Maternal Grandmother      Social History   Tobacco Use   Smoking status: Never    Passive exposure: Never   Smokeless tobacco: Never  Vaping Use   Vaping Use: Never used  Substance Use Topics   Alcohol use: No    Alcohol/week: 0.0 standard drinks of alcohol    Comment: wine occassionally   Drug use: No    Allergies as of 02/18/2022 - Review Complete 02/18/2022  Allergen Reaction Noted   Citalopram Other (See Comments) 09/22/2012   Doxycycline hyclate Nausea And Vomiting 01/23/2007   Erythromycin Other (See Comments) 11/09/2014   Montelukast sodium Itching 03/10/2006   Nitrofurantoin Nausea And Vomiting 03/10/2006   Sulfa antibiotics Nausea And Vomiting 08/12/2010   Tramadol hcl Other (See Comments)    Venlafaxine hcl Other (See Comments) 12/08/2012   Amoxicillin-pot clavulanate Nausea And Vomiting 08/01/2014   Clarithromycin Rash 02/19/2017     Imaging Studies: Reviewed  Assessment and Plan:   Tami Jones is a 67 y.o. pleasant Caucasian female with history of CVA on Plavix, diabetes, hypertension, hyperlipidemia is seen in consultation for chronic right upper quadrant pain associated with nausea, aggravated after a meal.  Right upper quadrant ultrasound revealed cholelithiasis.  LFTs were normal in the past.  Patient denies any fever, chills.  Chronic right upper quadrant pain Recommend H. pylori breath test Concern if the pain is secondary to peptic ulcer disease or symptomatic cholelithiasis Offered upper endoscopy but patient would like  to postpone it for about a month until she recovers from cold.  She said she will reach out to Dr. Silvio Pate regarding prolonged cold symptoms If H. pylori breath test is negative, recommend HIDA scan  Colon cancer screening Discussed about screening colonoscopy and patient is agreeable  Follow Up Instructions:   I discussed the assessment and treatment plan with the patient. The patient was provided an opportunity to ask questions and all were answered. The patient agreed with the plan and demonstrated an understanding of the instructions.   The patient was advised to call back or seek an in-person evaluation if the symptoms worsen or if the condition fails to improve as anticipated.  I provided 25 minutes of face-to-face time during this encounter.   Follow up based on the above workup   Cephas Darby, MD

## 2022-02-22 ENCOUNTER — Telehealth: Payer: Self-pay

## 2022-02-22 NOTE — Telephone Encounter (Signed)
Tried to call patient to find out when she wants to schedule her colonoscopy. Unable to leave a message because voicemail is full

## 2022-02-22 NOTE — Telephone Encounter (Signed)
-----   Message from Shelby Mattocks, [place] sent at 02/18/2022  3:14 PM EST ----- PCP office got back with [place] about stopping the Plavix 75mg  and they want her to stop the medication 5 days before procedure and restart right after the procedure.  ----- Message ----- From: Shelby Mattocks, CMA Sent: 02/18/2022   9:55 AM EST To: Shelby Mattocks, CMA  Per Dr. Marius Ditch  She needs EGD for right upper quadrant pain and colonoscopy for screening in a month from now.  She is recovering from Cold and she would like to wait.  Also, she needs clearance to stop Plavix  Patient wants to talk to her daughter about dates and she will let us know when she wants to schedule the procedure.

## 2022-02-22 NOTE — Telephone Encounter (Signed)
Tried to call patient but voicemail is full sent mychart message

## 2022-02-23 NOTE — Telephone Encounter (Signed)
Tried to call patient but voicemail is full  

## 2022-02-24 ENCOUNTER — Other Ambulatory Visit: Payer: Self-pay

## 2022-02-24 DIAGNOSIS — Z1211 Encounter for screening for malignant neoplasm of colon: Secondary | ICD-10-CM

## 2022-02-24 DIAGNOSIS — G8929 Other chronic pain: Secondary | ICD-10-CM

## 2022-02-24 MED ORDER — NA SULFATE-K SULFATE-MG SULF 17.5-3.13-1.6 GM/177ML PO SOLN
354.0000 mL | Freq: Once | ORAL | 0 refills | Status: AC
Start: 2022-02-24 — End: 2022-02-24

## 2022-02-24 NOTE — Addendum Note (Signed)
Addended by: Ulyess Blossom L on: 02/24/2022 09:36 AM   Modules accepted: Orders

## 2022-02-24 NOTE — Telephone Encounter (Signed)
Patient answered when I called and states she has a 3rd cold and does not want to do procedure till 04/16/2022. Schedule it for then. Went over instructions, sent to [employer] and mailed them. Sent prep to the pharmacy. Gave her blood thinner instructions and told her when to stop Glipizide

## 2022-03-02 ENCOUNTER — Telehealth: Payer: Self-pay | Admitting: Gastroenterology

## 2022-03-02 MED ORDER — CLENPIQ 10-3.5-12 MG-GM -GM/175ML PO SOLN: 175.0000 mL | Freq: Once | ORAL | 0 refills | Status: AC

## 2022-03-02 NOTE — Addendum Note (Signed)
Addended by: Ulyess Blossom L on: 03/02/2022 02:07 PM   Modules accepted: Orders

## 2022-03-02 NOTE — Telephone Encounter (Signed)
Patient called and stated that her bowel prep was way too expensive and she is wandering if there is an alternate solution. Requesting call back.

## 2022-03-02 NOTE — Telephone Encounter (Signed)
Sent clenpiq to the pharmacy. Tried to call patient but voicemail is full sent mychart message

## 2022-03-08 ENCOUNTER — Other Ambulatory Visit: Payer: Self-pay | Admitting: Internal Medicine

## 2022-03-20 ENCOUNTER — Other Ambulatory Visit: Payer: Self-pay | Admitting: Internal Medicine

## 2022-03-25 ENCOUNTER — Other Ambulatory Visit: Payer: Self-pay

## 2022-03-25 ENCOUNTER — Observation Stay: Payer: Medicare HMO

## 2022-03-25 ENCOUNTER — Encounter: Payer: Self-pay | Admitting: Family Medicine

## 2022-03-25 ENCOUNTER — Emergency Department: Payer: Medicare HMO

## 2022-03-25 ENCOUNTER — Observation Stay (HOSPITAL_BASED_OUTPATIENT_CLINIC_OR_DEPARTMENT_OTHER)
Admit: 2022-03-25 | Discharge: 2022-03-25 | Disposition: A | Discharge status: Home / Self Care | Payer: Medicare HMO | Attending: Family Medicine | Admitting: Family Medicine

## 2022-03-25 ENCOUNTER — Observation Stay
Admission: EM | Admit: 2022-03-25 | Discharge: 2022-03-26 | Disposition: A | Discharge status: Home / Self Care | Payer: Medicare HMO | Attending: Internal Medicine | Admitting: Internal Medicine

## 2022-03-25 DIAGNOSIS — E559 Vitamin D deficiency, unspecified: Secondary | ICD-10-CM | POA: Insufficient documentation

## 2022-03-25 DIAGNOSIS — R4702 Dysphasia: Secondary | ICD-10-CM | POA: Diagnosis present

## 2022-03-25 DIAGNOSIS — Z743 Need for continuous supervision: Secondary | ICD-10-CM | POA: Diagnosis not present

## 2022-03-25 DIAGNOSIS — R531 Weakness: Secondary | ICD-10-CM | POA: Diagnosis not present

## 2022-03-25 DIAGNOSIS — E119 Type 2 diabetes mellitus without complications: Secondary | ICD-10-CM | POA: Diagnosis not present

## 2022-03-25 DIAGNOSIS — G459 Transient cerebral ischemic attack, unspecified: Principal | ICD-10-CM | POA: Diagnosis present

## 2022-03-25 DIAGNOSIS — J45901 Unspecified asthma with (acute) exacerbation: Secondary | ICD-10-CM

## 2022-03-25 DIAGNOSIS — I6782 Cerebral ischemia: Secondary | ICD-10-CM | POA: Diagnosis not present

## 2022-03-25 DIAGNOSIS — Z79899 Other long term (current) drug therapy: Secondary | ICD-10-CM | POA: Insufficient documentation

## 2022-03-25 DIAGNOSIS — R4701 Aphasia: Secondary | ICD-10-CM | POA: Diagnosis not present

## 2022-03-25 DIAGNOSIS — I10 Essential (primary) hypertension: Secondary | ICD-10-CM | POA: Diagnosis not present

## 2022-03-25 DIAGNOSIS — I674 Hypertensive encephalopathy: Secondary | ICD-10-CM | POA: Diagnosis not present

## 2022-03-25 DIAGNOSIS — I160 Hypertensive urgency: Secondary | ICD-10-CM

## 2022-03-25 DIAGNOSIS — E785 Hyperlipidemia, unspecified: Secondary | ICD-10-CM | POA: Diagnosis not present

## 2022-03-25 DIAGNOSIS — Z7982 Long term (current) use of aspirin: Secondary | ICD-10-CM | POA: Diagnosis not present

## 2022-03-25 DIAGNOSIS — R350 Frequency of micturition: Secondary | ICD-10-CM | POA: Diagnosis present

## 2022-03-25 DIAGNOSIS — I619 Nontraumatic intracerebral hemorrhage, unspecified: Secondary | ICD-10-CM | POA: Diagnosis not present

## 2022-03-25 DIAGNOSIS — H538 Other visual disturbances: Secondary | ICD-10-CM | POA: Diagnosis not present

## 2022-03-25 DIAGNOSIS — R69 Illness, unspecified: Secondary | ICD-10-CM | POA: Diagnosis not present

## 2022-03-25 DIAGNOSIS — I6381 Other cerebral infarction due to occlusion or stenosis of small artery: Secondary | ICD-10-CM | POA: Diagnosis not present

## 2022-03-25 DIAGNOSIS — I639 Cerebral infarction, unspecified: Secondary | ICD-10-CM | POA: Diagnosis not present

## 2022-03-25 DIAGNOSIS — F419 Anxiety disorder, unspecified: Secondary | ICD-10-CM | POA: Diagnosis present

## 2022-03-25 HISTORY — DX: Dysphasia: R47.02

## 2022-03-25 HISTORY — DX: Hypertensive urgency: I16.0

## 2022-03-25 LAB — VITAMIN B12: Vitamin B-12: 186 pg/mL (ref 180–914)

## 2022-03-25 LAB — URINE DRUG SCREEN, QUALITATIVE (ARMC ONLY)
Amphetamines, Ur Screen: NOT DETECTED
Barbiturates, Ur Screen: NOT DETECTED
Benzodiazepine, Ur Scrn: NOT DETECTED
Cannabinoid 50 Ng, Ur ~~LOC~~: NOT DETECTED
Cocaine Metabolite,Ur ~~LOC~~: NOT DETECTED
MDMA (Ecstasy)Ur Screen: NOT DETECTED
Methadone Scn, Ur: NOT DETECTED
Opiate, Ur Screen: NOT DETECTED
Phencyclidine (PCP) Ur S: NOT DETECTED
Tricyclic, Ur Screen: NOT DETECTED

## 2022-03-25 LAB — DIFFERENTIAL
Abs Immature Granulocytes: 0.02 10*3/uL (ref 0.00–0.07)
Basophils Absolute: 0 10*3/uL (ref 0.0–0.1)
Basophils Relative: 0 %
Eosinophils Absolute: 0.1 10*3/uL (ref 0.0–0.5)
Eosinophils Relative: 2 %
Immature Granulocytes: 0 %
Lymphocytes Relative: 32 %
Lymphs Abs: 2.3 10*3/uL (ref 0.7–4.0)
Monocytes Absolute: 0.6 10*3/uL (ref 0.1–1.0)
Monocytes Relative: 8 %
Neutro Abs: 4.1 10*3/uL (ref 1.7–7.7)
Neutrophils Relative %: 58 %

## 2022-03-25 LAB — ECHOCARDIOGRAM COMPLETE BUBBLE STUDY
AR max vel: 2.35 cm2
AV Area VTI: 2.56 cm2
AV Area mean vel: 2.16 cm2
AV Mean grad: 4 mmHg
AV Peak grad: 8 mmHg
Ao pk vel: 1.41 m/s
Area-P 1/2: 2.12 cm2
MV VTI: 2.64 cm2
S' Lateral: 2.6 cm

## 2022-03-25 LAB — CBC
HCT: 38.1 % (ref 36.0–46.0)
HCT: 42.5 % (ref 36.0–46.0)
Hemoglobin: 12.4 g/dL (ref 12.0–15.0)
Hemoglobin: 13.9 g/dL (ref 12.0–15.0)
MCH: 29.7 pg (ref 26.0–34.0)
MCH: 29.8 pg (ref 26.0–34.0)
MCHC: 32.5 g/dL (ref 30.0–36.0)
MCHC: 32.7 g/dL (ref 30.0–36.0)
MCV: 91 fL (ref 80.0–100.0)
MCV: 91.1 fL (ref 80.0–100.0)
Platelets: 241 10*3/uL (ref 150–400)
Platelets: 267 10*3/uL (ref 150–400)
RBC: 4.18 MIL/uL (ref 3.87–5.11)
RBC: 4.67 MIL/uL (ref 3.87–5.11)
RDW: 12.7 % (ref 11.5–15.5)
RDW: 12.8 % (ref 11.5–15.5)
WBC: 6.2 10*3/uL (ref 4.0–10.5)
WBC: 7.2 10*3/uL (ref 4.0–10.5)
nRBC: 0 % (ref 0.0–0.2)
nRBC: 0 % (ref 0.0–0.2)

## 2022-03-25 LAB — URINALYSIS, ROUTINE W REFLEX MICROSCOPIC
Bilirubin Urine: NEGATIVE
Glucose, UA: 50 mg/dL — AB
Hgb urine dipstick: NEGATIVE
Ketones, ur: NEGATIVE mg/dL
Leukocytes,Ua: NEGATIVE
Nitrite: NEGATIVE
Protein, ur: 100 mg/dL — AB
Specific Gravity, Urine: 1.004 — ABNORMAL LOW (ref 1.005–1.030)
pH: 8 (ref 5.0–8.0)

## 2022-03-25 LAB — COMPREHENSIVE METABOLIC PANEL
ALT: 26 U/L (ref 0–44)
AST: 26 U/L (ref 15–41)
Albumin: 4 g/dL (ref 3.5–5.0)
Alkaline Phosphatase: 75 U/L (ref 38–126)
Anion gap: 9 (ref 5–15)
BUN: 18 mg/dL (ref 8–23)
CO2: 25 mmol/L (ref 22–32)
Calcium: 8.8 mg/dL — ABNORMAL LOW (ref 8.9–10.3)
Chloride: 103 mmol/L (ref 98–111)
Creatinine, Ser: 1.12 mg/dL — ABNORMAL HIGH (ref 0.44–1.00)
GFR, Estimated: 55 mL/min — ABNORMAL LOW (ref >60)
Glucose, Bld: 175 mg/dL — ABNORMAL HIGH (ref 70–99)
Potassium: 4 mmol/L (ref 3.5–5.1)
Sodium: 137 mmol/L (ref 135–145)
Total Bilirubin: 0.7 mg/dL (ref 0.3–1.2)
Total Protein: 7.6 g/dL (ref 6.5–8.1)

## 2022-03-25 LAB — PROTIME-INR
INR: 0.9 (ref 0.8–1.2)
Prothrombin Time: 12.2 seconds (ref 11.4–15.2)

## 2022-03-25 LAB — GLUCOSE, CAPILLARY
Glucose-Capillary: 199 mg/dL — ABNORMAL HIGH (ref 70–99)
Glucose-Capillary: 214 mg/dL — ABNORMAL HIGH (ref 70–99)
Glucose-Capillary: 156 mg/dL — ABNORMAL HIGH (ref 70–99)
Glucose-Capillary: 119 mg/dL — ABNORMAL HIGH (ref 70–99)
Glucose-Capillary: 129 mg/dL — ABNORMAL HIGH (ref 70–99)

## 2022-03-25 LAB — LIPID PANEL
Cholesterol: 220 mg/dL — ABNORMAL HIGH (ref 0–200)
HDL: 37 mg/dL — ABNORMAL LOW (ref >40)
LDL Cholesterol: 161 mg/dL — ABNORMAL HIGH (ref 0–99)
Total CHOL/HDL Ratio: 5.9 RATIO
Triglycerides: 112 mg/dL (ref <150)
VLDL: 22 mg/dL (ref 0–40)

## 2022-03-25 LAB — HIV ANTIBODY (ROUTINE TESTING W REFLEX): HIV Screen 4th Generation wRfx: NONREACTIVE

## 2022-03-25 LAB — CBG MONITORING, ED: Glucose-Capillary: 159 mg/dL — ABNORMAL HIGH (ref 70–99)

## 2022-03-25 LAB — BASIC METABOLIC PANEL
Anion gap: 8 (ref 5–15)
BUN: 17 mg/dL (ref 8–23)
CO2: 24 mmol/L (ref 22–32)
Calcium: 8.3 mg/dL — ABNORMAL LOW (ref 8.9–10.3)
Chloride: 104 mmol/L (ref 98–111)
Creatinine, Ser: 1.04 mg/dL — ABNORMAL HIGH (ref 0.44–1.00)
GFR, Estimated: 60 mL/min — ABNORMAL LOW (ref >60)
Glucose, Bld: 202 mg/dL — ABNORMAL HIGH (ref 70–99)
Potassium: 3.8 mmol/L (ref 3.5–5.1)
Sodium: 136 mmol/L (ref 135–145)

## 2022-03-25 LAB — APTT: aPTT: 28 seconds (ref 24–36)

## 2022-03-25 LAB — ETHANOL: Alcohol, Ethyl (B): <10 mg/dL (ref <10)

## 2022-03-25 MED ORDER — PANTOPRAZOLE SODIUM 40 MG PO TBEC
40.0000 mg | DELAYED_RELEASE_TABLET | Freq: Every day | ORAL | Status: DC
  Administered 2022-03-25 – 2022-03-26 (×2): 40 mg via ORAL
  Filled 2022-03-25 (×2): qty 1

## 2022-03-25 MED ORDER — ATORVASTATIN CALCIUM 20 MG PO TABS
80.0000 mg | ORAL_TABLET | Freq: Every evening | ORAL | Status: DC
  Administered 2022-03-25: 80 mg via ORAL
  Filled 2022-03-25: qty 4

## 2022-03-25 MED ORDER — SODIUM CHLORIDE 0.9 % IV SOLN: INTRAVENOUS | Status: DC

## 2022-03-25 MED ORDER — CLOPIDOGREL BISULFATE 75 MG PO TABS
75.0000 mg | ORAL_TABLET | Freq: Every day | ORAL | Status: DC
  Administered 2022-03-25 – 2022-03-26 (×2): 75 mg via ORAL
  Filled 2022-03-25 (×2): qty 1

## 2022-03-25 MED ORDER — ACETAMINOPHEN 500 MG PO TABS
1000.0000 mg | ORAL_TABLET | Freq: Once | ORAL | Status: AC
  Administered 2022-03-25: 1000 mg via ORAL
  Filled 2022-03-25: qty 2

## 2022-03-25 MED ORDER — GLIPIZIDE ER 5 MG PO TB24
5.0000 mg | ORAL_TABLET | Freq: Two times a day (BID) | ORAL | Status: DC
  Administered 2022-03-25 – 2022-03-26 (×2): 5 mg via ORAL
  Filled 2022-03-25 (×3): qty 1

## 2022-03-25 MED ORDER — ENOXAPARIN SODIUM 60 MG/0.6ML IJ SOSY
0.5000 mg/kg | PREFILLED_SYRINGE | INTRAMUSCULAR | Status: DC
  Administered 2022-03-25 – 2022-03-26 (×2): 52.5 mg via SUBCUTANEOUS
  Filled 2022-03-25 (×2): qty 0.6

## 2022-03-25 MED ORDER — ONDANSETRON HCL 4 MG/2ML IJ SOLN: 4.0000 mg | Freq: Four times a day (QID) | INTRAMUSCULAR | Status: DC | PRN

## 2022-03-25 MED ORDER — ALPRAZOLAM 0.25 MG PO TABS
0.2500 mg | ORAL_TABLET | Freq: Three times a day (TID) | ORAL | Status: DC | PRN
  Administered 2022-03-25: 0.25 mg via ORAL
  Filled 2022-03-25: qty 1

## 2022-03-25 MED ORDER — ALBUTEROL SULFATE (2.5 MG/3ML) 0.083% IN NEBU: 2.5000 mg | INHALATION_SOLUTION | Freq: Four times a day (QID) | RESPIRATORY_TRACT | Status: DC | PRN

## 2022-03-25 MED ORDER — METOPROLOL SUCCINATE ER 25 MG PO TB24
25.0000 mg | ORAL_TABLET | Freq: Every day | ORAL | Status: DC
  Administered 2022-03-25 – 2022-03-26 (×2): 25 mg via ORAL
  Filled 2022-03-25 (×2): qty 1

## 2022-03-25 MED ORDER — ALBUTEROL SULFATE HFA 108 (90 BASE) MCG/ACT IN AERS: 2.0000 | INHALATION_SPRAY | Freq: Four times a day (QID) | RESPIRATORY_TRACT | Status: DC | PRN

## 2022-03-25 MED ORDER — LABETALOL HCL 5 MG/ML IV SOLN
20.0000 mg | Freq: Once | INTRAVENOUS | Status: AC
  Administered 2022-03-25: 20 mg via INTRAVENOUS
  Filled 2022-03-25: qty 4

## 2022-03-25 MED ORDER — MAGNESIUM HYDROXIDE 400 MG/5ML PO SUSP: 30.0000 mL | Freq: Every day | ORAL | Status: DC | PRN

## 2022-03-25 MED ORDER — TRAZODONE HCL 50 MG PO TABS: 25.0000 mg | ORAL_TABLET | Freq: Every evening | ORAL | Status: DC | PRN

## 2022-03-25 MED ORDER — ACETAMINOPHEN 650 MG RE SUPP: 650.0000 mg | Freq: Four times a day (QID) | RECTAL | Status: DC | PRN

## 2022-03-25 MED ORDER — ALPRAZOLAM 0.5 MG PO TABS
0.5000 mg | ORAL_TABLET | Freq: Once | ORAL | Status: AC
  Administered 2022-03-25: 0.5 mg via ORAL
  Filled 2022-03-25: qty 1

## 2022-03-25 MED ORDER — ASPIRIN 81 MG PO TBEC
81.0000 mg | DELAYED_RELEASE_TABLET | Freq: Every evening | ORAL | Status: DC
  Administered 2022-03-25: 81 mg via ORAL
  Filled 2022-03-25: qty 1

## 2022-03-25 MED ORDER — ACETAMINOPHEN 325 MG PO TABS
650.0000 mg | ORAL_TABLET | Freq: Four times a day (QID) | ORAL | Status: DC | PRN
  Administered 2022-03-25: 650 mg via ORAL
  Filled 2022-03-25: qty 2

## 2022-03-25 MED ORDER — INSULIN ASPART 100 UNIT/ML IJ SOLN
0.0000 [IU] | Freq: Three times a day (TID) | INTRAMUSCULAR | Status: DC
  Administered 2022-03-25: 3 [IU] via SUBCUTANEOUS
  Administered 2022-03-25: 5 [IU] via SUBCUTANEOUS
  Administered 2022-03-26: 3 [IU] via SUBCUTANEOUS
  Filled 2022-03-25 (×3): qty 1

## 2022-03-25 MED ORDER — AMLODIPINE BESYLATE 10 MG PO TABS
10.0000 mg | ORAL_TABLET | Freq: Every evening | ORAL | Status: DC
  Administered 2022-03-25: 10 mg via ORAL
  Filled 2022-03-25: qty 1

## 2022-03-25 MED ORDER — ONDANSETRON HCL 4 MG PO TABS: 4.0000 mg | ORAL_TABLET | Freq: Four times a day (QID) | ORAL | Status: DC | PRN

## 2022-03-25 MED ORDER — STROKE: EARLY STAGES OF RECOVERY BOOK: Freq: Once | Status: AC

## 2022-03-25 NOTE — Progress Notes (Signed)
Brief rounding note, same day as admission  HPI: See H&P for full HPI on admission.  Pt admitted after midnight with stroke-like symptoms including slurred speech, word finding difficulties and confusion.  She has hx of prior stroke with residual right-sided weakness.  Symptoms resolved by time of admission.  Interval history: Today, pt reports improvement in her symptoms, but not complete resolution.   She also reports last night having tingling in both feet.  She reports difficulty with balance lately, as she tries to ambulate as much as possible to maintain her independence.     Exam: General exam: awake, alert, no acute distress, obese HEENT: atraumatic, clear conjunctiva, anicteric sclera, moist mucus membranes, hearing grossly normal  Respiratory system: CTAB, no wheezes, rales or rhonchi, normal respiratory effort. Cardiovascular system: normal S1/S2, RRR, unable to visualize JVD, no peripheral edema.   Gastrointestinal system: soft, NT, ND, no HSM felt, +bowel sounds. Central nervous system: A&O x4. Speech is mildly slow but overall fluent. Occasional delay noted with word finding.  Right grip is weak.   Extremities: moves all  no edema, normal tone Skin: dry, intact, normal temperature, normal color, No rashes, lesions or ulcers Psychiatry: normal mood, congruent affect, judgement and insight appear normal    A&P: as per H&P by Dr. Sidney Ace, with any changes or additions as below:  --follow up pending Echo --follow up recommendations from PT, OT, SLP --follow up Neurology recommendations --anticipate discharge once above work up is complete --will add on labs to check for any nutritional deficiencies that may contribute to her symptoms     No charge

## 2022-03-25 NOTE — Assessment & Plan Note (Addendum)
Continue Norvasc and Toprol-XL

## 2022-03-25 NOTE — ED Notes (Signed)
Patient transported to MRI 

## 2022-03-25 NOTE — Assessment & Plan Note (Deleted)
-   We will get continue Norvasc and Toprol-XL with permissive parameters.

## 2022-03-25 NOTE — ED Provider Notes (Signed)
[facility] Provider Note    Event Date/Time   First MD Initiated Contact with Patient 03/25/22 0037     (approximate)   History   Aphasia   HPI  Tami Jones is a 67 y.o. female who presents to the ED for evaluation of Aphasia   I reviewed routine PCP visit from 11/13.  Morbidly obese patient history of stroke and right-sided weakness, still some residual right-sided weakness.  Lives at home with her family and ambulates with a walker or cane.  Also history of anxiety with as needed benzodiazepines prescribed.  Patient presents to the ED for evaluation of "another stroke or panic attack." She reports laying in bed tonight trying to go to sleep when she developed bilateral foot tingling and a heaviness sensation in her head. She reports that she felt confused.   She reports feeling better by the time she arrives to the ED and I assess her. She reports no preceding illnesses. She has been compliant with her antihypertensives.    Physical Exam   Triage Vital Signs: ED Triage Vitals  Enc Vitals Group     BP --      Pulse Rate 03/25/22 0031 96     Resp 03/25/22 0031 18     Temp 03/25/22 0031 98.7 F (37.1 C)     Temp Source 03/25/22 0031 Oral     SpO2 03/25/22 0029 100 %     Weight 03/25/22 0032 230 lb (104.3 kg)     Height 03/25/22 0032 5\' 3"  (1.6 m)     Head Circumference --      Peak Flow --      Pain Score 03/25/22 0032 0     Pain Loc --      Pain Edu? --      Excl. in [place]? --     Most recent vital signs: Vitals:   03/25/22 0100 03/25/22 0115  BP: (!) 156/80 (!) 154/76  Pulse: 81 78  Resp:  14  Temp:    SpO2: 98% 98%    General: Awake, no distress.  CV:  Good peripheral perfusion.  Resp:  Normal effort.  Abd:  No distention.  MSK:  No deformity noted.  Neuro:  Right arm 4.5/5, right hand 4/5, right leg 3-4/5. Left side 5/5 strength. Sensation intact throughout. Cranial nerves intact. She reports this right sided weakness is  typical for her  Other:     ED Results / Procedures / Treatments   Labs (all labs ordered are listed, but only abnormal results are displayed) Labs Reviewed  COMPREHENSIVE METABOLIC PANEL - Abnormal; Notable for the following components:      Result Value   Glucose, Bld 175 (*)    Creatinine, Ser 1.12 (*)    Calcium 8.8 (*)    GFR, Estimated 55 (*)    All other components within normal limits  URINALYSIS, ROUTINE W REFLEX MICROSCOPIC - Abnormal; Notable for the following components:   Color, Urine COLORLESS (*)    APPearance CLEAR (*)    Specific Gravity, Urine 1.004 (*)    Glucose, UA 50 (*)    Protein, ur 100 (*)    Bacteria, UA RARE (*)    All other components within normal limits  CBG MONITORING, ED - Abnormal; Notable for the following components:   Glucose-Capillary 159 (*)    All other components within normal limits  ETHANOL  PROTIME-INR  APTT  CBC  DIFFERENTIAL  URINE DRUG SCREEN, QUALITATIVE (  [facility] ONLY)    EKG Poor quality EKG seems to demonstrate sinus rhythm with a rate of 99 bpm.  Normal axis and intervals.  No clear signs of acute ischemia.  RADIOLOGY CT head interpreted by me without evidence of acute intracranial pathology  Official radiology report(s): CT HEAD WO CONTRAST  Result Date: 03/25/2022 CLINICAL DATA:  Aphasia and vision blurring on the left side, resolving. EXAM: CT HEAD WITHOUT CONTRAST TECHNIQUE: Contiguous axial images were obtained from the base of the skull through the vertex without intravenous contrast. RADIATION DOSE REDUCTION: This exam was performed according to the departmental dose-optimization program which includes automated exposure control, adjustment of the mA and/or kV according to patient size and/or use of iterative reconstruction technique. COMPARISON:  CT 08/27/2019.  MRI 08/25/2019 FINDINGS: Brain: Encephalomalacia in the posterior occipital lobes corresponding to previous infarcts seen on prior studies. No mass-effect or  midline shift. No abnormal extra-axial fluid collections. Ventricles are not dilated. Basal cisterns are not effaced. No acute intracranial hemorrhage. Vascular: No hyperdense vessel or unexpected calcification. Skull: Calvarium appears intact. Sinuses/Orbits: Paranasal sinuses and mastoid air cells are clear. Other: None. IMPRESSION: No acute intracranial abnormalities. Old bilateral occipital infarcts similar to prior study. Electronically Signed   By: Lucienne Capers M.D.   On: 03/25/2022 01:13    PROCEDURES and INTERVENTIONS:  .1-3 Lead EKG Interpretation  Performed by: Vladimir Crofts, MD Authorized by: Vladimir Crofts, MD     Interpretation: normal     ECG rate:  76   ECG rate assessment: normal     Rhythm: sinus rhythm     Ectopy: none     Conduction: normal   .Critical Care  Performed by: Vladimir Crofts, MD Authorized by: Vladimir Crofts, MD   Critical care provider statement:    Critical care time (minutes):  30   Critical care time was exclusive of:  Separately billable procedures and treating other patients   Critical care was necessary to treat or prevent imminent or life-threatening deterioration of the following conditions:  CNS failure or compromise and circulatory failure   Critical care was time spent personally by me on the following activities:  Development of treatment plan with patient or surrogate, discussions with consultants, evaluation of patient's response to treatment, examination of patient, ordering and review of laboratory studies, ordering and review of radiographic studies, ordering and performing treatments and interventions, pulse oximetry, re-evaluation of patient's condition and review of old charts   Medications  labetalol (NORMODYNE) injection 20 mg (20 mg Intravenous Given 03/25/22 0046)  ALPRAZolam Duanne Moron) tablet 0.5 mg (0.5 mg Oral Given 03/25/22 0046)     IMPRESSION / MDM / [place] / ED COURSE  I reviewed the triage vital signs and the nursing  notes.  Differential diagnosis includes, but is not limited to, stroke, TIA, panic attack, ICH  {Patient presents with symptoms of an acute illness or injury that is potentially life-threatening.  67 year old woman with history of stroke presents after a resolved episode of word finding difficulties concerning for transient aphasia and TIA, requiring medical admission.  Resolved by the time I see here and does not meet stroke alert criteria considering her chronic right-sided weakness.  She is fluent with me.  CT head without ICH.  First blood pressure is quite elevated with systolics in the 409W, and with her presenting symptoms and the concern for hypertensive emergency, she received 20 mg of IV labetalol with good BP effect.  Blood work is benign with a normal CBC  and essentially normal metabolic panel.  Urine without infectious features.  Doubt infectious etiology of her symptoms.  Will consult medicine for admission.  Clinical Course as of 03/25/22 0141  Thu Mar 25, 2022  0139 Reassessed.  Son-in-law is now at the bedside and provides further history.  He reports that patient was having significant difficulty with finding her words at home, but seems much better now.  Patient also knowledges that she feels much better now.  We discussed CT scan, blood work.  Discussed TIAs and strokes.  Recommended medical admission and they are agreeable. [DS]    Clinical Course User Index [DS] Vladimir Crofts, MD     FINAL CLINICAL IMPRESSION(S) / ED DIAGNOSES   Final diagnoses:  TIA (transient ischemic attack)  Aphasia     Rx / DC Orders   ED Discharge Orders     None        Note:  This document was prepared using Dragon voice recognition software and may include unintentional dictation errors.   Vladimir Crofts, MD 03/25/22 [id number]

## 2022-03-25 NOTE — ED Notes (Signed)
Pt returned from MRI at this time

## 2022-03-25 NOTE — Evaluation (Signed)
Occupational Therapy Evaluation Patient Details Name: Tami Jones MRN: [id number] DOB: [birth year] Today's Date: 03/25/2022   History of Present Illness Pt is a 67 year old female presenting to the ED with acute onset of expressive dysphasia  with word finding difficulty; MRI showed  No acute intracranial abnormality, Chronic bilateral PCA territory infarcts involving both occipital Lobe, Underlying chronic microvascular ischemic disease with a few remote lacunar infarcts about the left greater than right basal Ganglia; PMH significant for asthma, type 2 diabetes mellitus, CVA with residual right-sided weakness, GERD, hypertension and dyslipidemia as well as anxiety   Clinical Impression   Chart reviewed, nurse cleared pt for participation in OT evaluation. Pt greeted in bed, alert and oriented x4, agreeable to OT intervention. Mildly increased time required for processing. Pt reports residual deficits throughout RUE, vision from previous strokes however was performing ADL/IADl at a MOD I level, amb with SPC except she has not been driving. Pt presents with deficits in strength, endurance, activity tolerance, balance all affecting safe an optimal ADL completion. Pt is weaker throughout as compared to baseline. Recommend discharge with HHOT to address functional deficits and to facilitate return to PLOF. OT will continue to follow acutely.      Recommendations for follow up therapy are one component of a multi-disciplinary discharge planning process, led by the attending physician.  Recommendations may be updated based on patient status, additional functional criteria and insurance authorization.   Follow Up Recommendations  Home health OT     Assistance Recommended at Discharge Intermittent Supervision/Assistance  Patient can return home with the following A little help with walking and/or transfers;A little help with bathing/dressing/bathroom;Assistance with cooking/housework;Direct  supervision/assist for financial management;Direct supervision/assist for medications management    Functional Status Assessment  Patient has had a recent decline in their functional status and demonstrates the ability to make significant improvements in function in a reasonable and predictable amount of time.  Equipment Recommendations  None recommended by OT;Other (comment) (pt has recommended equipment)    Recommendations for Other Services       Precautions / Restrictions Precautions Precautions: Fall Restrictions Weight Bearing Restrictions: No      Mobility Bed Mobility Overal bed mobility: Needs Assistance Bed Mobility: Supine to Sit, Sit to Supine     Supine to sit: Supervision, HOB elevated Sit to supine: Min assist, HOB elevated        Transfers Overall transfer level: Needs assistance Equipment used: Rolling walker (2 wheels) Transfers: Sit to/from Stand Sit to Stand: Supervision, Min guard                  Balance Overall balance assessment: Needs assistance Sitting-balance support: Feet supported Sitting balance-Leahy Scale: Good     Standing balance support: Bilateral upper extremity supported, During functional activity Standing balance-Leahy Scale: Fair                             ADL either performed or assessed with clinical judgement   ADL Overall ADL's : Needs assistance/impaired     Grooming: Wash/dry hands;Supervision/safety;Standing Grooming Details (indicate cue type and reason): sink level, intermittent vcs for RW use                 Toilet Transfer: Min guard;Rolling walker (2 wheels);Ambulation;Cueing for safety   Toileting- Clothing Manipulation and Hygiene: Supervision/safety;Sit to/from stand       Functional mobility during ADLs: Rolling walker (2 wheels);Supervision/safety;Min guard;Cueing for safety (  approx 20' 2 attempts in room with RW)       Vision Patient Visual Report: Blurring of vision  (pt has peripheral visual deficits from previous strokes, a cataract) Vision Assessment?: Yes Ocular Range of Motion: Impaired-to be further tested in functional context Tracking/Visual Pursuits: Decreased smoothness of eye movement to RIGHT superior field Additional Comments: pt reports baseline visual defiicts, a little blurrier than normal, will continue to assess     Perception     Praxis      Pertinent Vitals/Pain Pain Assessment Pain Assessment: No/denies pain     Hand Dominance Right   Extremity/Trunk Assessment Upper Extremity Assessment Upper Extremity Assessment: RUE deficits/detail;LUE deficits/detail RUE Deficits / Details: baseline RUE deficits- AROM; shoudler flexion to approx 70 degrees, elbow flexion/extension, globally weak throughout; pt reports mild weakness compared to baseline RUE deficits RUE:  (pain with PROM past 90 degres; all  other PROM WFL) RUE Sensation: WNL RUE Coordination: decreased fine motor;decreased gross motor LUE Deficits / Details: shoulder AROM approx 3/4 full AROM, all other WFL, 3+/5 shoulder flexion, elbow, wrist/hand 4-/5   Lower Extremity Assessment Lower Extremity Assessment: RLE deficits/detail;Generalized weakness       Communication Communication Communication: Expressive difficulties   Cognition Arousal/Alertness: Awake/alert Behavior During Therapy: WFL for tasks assessed/performed Overall Cognitive Status: Impaired/Different from baseline Area of Impairment: Problem solving                             Problem Solving: Slow processing       General Comments  vital signs appear stable throughout    Exercises Other Exercises Other Exercises: edu pt and daughter re: role of OT, role of rehab, discharge recommendations, DME use, home safety   Shoulder Instructions      Home Living Family/patient expects to be discharged to:: Private residence Living Arrangements: Children Available Help at Discharge:  Family;Available 24 hours/day Type of Home: House Home Access: Stairs to enter [employer] of Steps: 3 Entrance Stairs-Rails: Right;Left Home Layout: One level     Bathroom Shower/Tub: Teacher, early years/pre: Standard Bathroom Accessibility: Yes   Home Equipment: Conservation officer, nature (2 wheels);Cane - quad;BSC/3in1;Shower seat;Grab bars - tub/shower;Hand held shower head   Additional Comments: lives with daugther, son in Sports coach, two young grand children      Prior Functioning/Environment Prior Level of Function : Independent/Modified Independent;Needs assist             Mobility Comments: amb wtih SPC ADLs Comments: MOD I in ADL/IADL with compensatory techniques, AE; does not drive        OT Problem List: Decreased activity tolerance;Impaired balance (sitting and/or standing);Decreased safety awareness;Decreased knowledge of use of DME or AE;Decreased strength      OT Treatment/Interventions: Self-care/ADL training;Therapeutic exercise;Patient/family education;Neuromuscular education;Balance training;Energy conservation;Therapeutic activities;DME and/or AE instruction    OT Goals(Current goals can be found in the care plan section) Acute Rehab OT Goals Patient Stated Goal: go home OT Goal Formulation: With patient Time For Goal Achievement: 04/08/22 Potential to Achieve Goals: Good ADL Goals Pt Will Perform Grooming: with modified independence;standing;sitting Pt Will Perform Lower Body Dressing: with modified independence;sit to/from stand Pt Will Transfer to Toilet: with modified independence Pt Will Perform Toileting - Clothing Manipulation and hygiene: with modified independence;sit to/from stand  OT Frequency: Min 2X/week    Co-evaluation              AM-PAC OT "6 Clicks" Daily Activity  Outcome Measure Help from another person eating meals?: None Help from another person taking care of personal grooming?: None Help from another person  toileting, which includes using toliet, bedpan, or urinal?: A Little Help from another person bathing (including washing, rinsing, drying)?: A Little Help from another person to put on and taking off regular upper body clothing?: A Little Help from another person to put on and taking off regular lower body clothing?: A Lot 6 Click Score: 19   End of Session Equipment Utilized During Treatment: Rolling walker (2 wheels)  Activity Tolerance: Patient tolerated treatment well Patient left: in bed;with call bell/phone within reach;with bed alarm set;with family/visitor present  OT Visit Diagnosis: Unsteadiness on feet (R26.81);Muscle weakness (generalized) (M62.81);Other abnormalities of gait and mobility (R26.89)                Time: 6203-5597 OT Time Calculation (min): 27 min Charges:  OT General Charges $OT Visit: 1 Visit OT Evaluation $OT Eval Low Complexity: 1 Low  Shanon Payor, OTD OTR/L  03/25/22, 12:41 PM

## 2022-03-25 NOTE — ED Triage Notes (Addendum)
Pt arrives from home via [facility].  C/O d/t aphasia and "slow processing" per daughter.  Pt hypertensive at 220/120 per EMS. HX of stroke.  Stroke screen neg per EMS. Pt denies Pain, A&Ox4, endorses heaviness to back of head.

## 2022-03-25 NOTE — Assessment & Plan Note (Addendum)
Evaluation and assessment as outlined above. Presentation and evaluation most consistent with possible L MCA TIA. --Continue ASA, Plavix and statin --Follow up with Neurology --Continue risk factor modification including BP and blood sugar optimization

## 2022-03-25 NOTE — Assessment & Plan Note (Addendum)
Continue as needed albuterol inhaler.

## 2022-03-25 NOTE — Progress Notes (Signed)
PHARMACIST - PHYSICIAN COMMUNICATION  CONCERNING:  Enoxaparin (Lovenox) for DVT Prophylaxis    RECOMMENDATION: Patient was prescribed enoxaprin 40mg  q24 hours for VTE prophylaxis.   Filed Weights   03/25/22 0032  Weight: 104.3 kg (230 lb)    Body mass index is 40.74 kg/m.  Estimated Creatinine Clearance: 57.9 mL/min (A) (by C-G formula based on SCr of 1.12 mg/dL (H)).   Based on [place] patient is candidate for enoxaparin 0.5mg /kg TBW SQ every 24 hours based on BMI being >30.  DESCRIPTION: Pharmacy has adjusted enoxaparin dose per [facility] policy.  Patient is now receiving enoxaparin 0.5 mg/kg every 24 hours   Renda Rolls, PharmD, [facility] 03/25/2022 2:06 AM

## 2022-03-25 NOTE — Plan of Care (Signed)
  Problem: Education: Goal: Knowledge of disease or condition will improve Outcome: Not Progressing Goal: Knowledge of secondary prevention will improve (MUST DOCUMENT ALL) Outcome: Not Progressing Goal: Knowledge of patient specific risk factors will improve [place] N/A or DELETE if not current risk factor) Outcome: Not Progressing   Problem: Ischemic Stroke/TIA Tissue Perfusion: Goal: Complications of ischemic stroke/TIA will be minimized Outcome: Not Progressing   Problem: Coping: Goal: Will verbalize positive feelings about self Outcome: Not Progressing Goal: Will identify appropriate support needs Outcome: Not Progressing   Problem: Health Behavior/Discharge Planning: Goal: Ability to manage health-related needs will improve Outcome: Not Progressing Goal: Goals will be collaboratively established with patient/family Outcome: Not Progressing   Problem: Self-Care: Goal: Ability to participate in self-care as condition permits will improve Outcome: Not Progressing Goal: Verbalization of feelings and concerns over difficulty with self-care will improve Outcome: Not Progressing Goal: Ability to communicate needs accurately will improve Outcome: Not Progressing   Problem: Nutrition: Goal: Risk of aspiration will decrease Outcome: Not Progressing Goal: Dietary intake will improve Outcome: Not Progressing   Problem: Education: Goal: Ability to describe self-care measures that may prevent or decrease complications (Diabetes Survival Skills Education) will improve Outcome: Not Progressing Goal: Individualized Educational Video(s) Outcome: Not Progressing   Problem: Coping: Goal: Ability to adjust to condition or change in health will improve Outcome: Not Progressing   Problem: Fluid Volume: Goal: Ability to maintain a balanced intake and output will improve Outcome: Not Progressing   Problem: Health Behavior/Discharge Planning: Goal: Ability to identify and utilize  available resources and services will improve Outcome: Not Progressing Goal: Ability to manage health-related needs will improve Outcome: Not Progressing   Problem: Metabolic: Goal: Ability to maintain appropriate glucose levels will improve Outcome: Not Progressing   Problem: Nutritional: Goal: Maintenance of adequate nutrition will improve Outcome: Not Progressing Goal: Progress toward achieving an optimal weight will improve Outcome: Not Progressing   Problem: Skin Integrity: Goal: Risk for impaired skin integrity will decrease Outcome: Not Progressing   Problem: Tissue Perfusion: Goal: Adequacy of tissue perfusion will improve Outcome: Not Progressing   Problem: Education: Goal: Knowledge of General Education information will improve Description: Including pain rating scale, medication(s)/side effects and non-pharmacologic comfort measures Outcome: Not Progressing   Problem: Health Behavior/Discharge Planning: Goal: Ability to manage health-related needs will improve Outcome: Not Progressing   Problem: Clinical Measurements: Goal: Ability to maintain clinical measurements within normal limits will improve Outcome: Not Progressing Goal: Will remain free from infection Outcome: Not Progressing Goal: Diagnostic test results will improve Outcome: Not Progressing Goal: Respiratory complications will improve Outcome: Not Progressing Goal: Cardiovascular complication will be avoided Outcome: Not Progressing   Problem: Activity: Goal: Risk for activity intolerance will decrease Outcome: Not Progressing   Problem: Nutrition: Goal: Adequate nutrition will be maintained Outcome: Not Progressing   Problem: Coping: Goal: Level of anxiety will decrease Outcome: Not Progressing   Problem: Elimination: Goal: Will not experience complications related to bowel motility Outcome: Not Progressing Goal: Will not experience complications related to urinary retention Outcome:  Not Progressing   Problem: Pain Managment: Goal: General experience of comfort will improve Outcome: Not Progressing   Problem: Safety: Goal: Ability to remain free from injury will improve Outcome: Not Progressing   Problem: Skin Integrity: Goal: Risk for impaired skin integrity will decrease Outcome: Not Progressing

## 2022-03-25 NOTE — TOC Transition Note (Signed)
Transition of Care [facility]) - CM/SW Discharge Note   Patient Details  Name: ERIKA SLABY MRN: [id number] Date of Birth: September 23, 1955  [facility] CM/SW Contact:  Tiburcio Bash, LCSW Phone Number: 03/25/2022, 2:35 PM   Clinical Narrative:      CSW spoke with patient regarding [place] recommendations, she reports being in agreement with no preference of [place] agency. Referral given to [facility] with [facility] for PT and OT. No dme needs, patient has walker at home.     Barriers to Discharge: Continued Medical Work up   Patient Goals and CMS Choice CMS Medicare.gov Compare Post Acute Care list provided to:: Patient Choice offered to / list presented to : Patient  Discharge Placement                         Discharge Plan and Services Additional resources added to the After Visit Summary for                                       Social Determinants of Health (SDOH) Interventions SDOH Screenings   Food Insecurity: No Food Insecurity (03/25/2022)  Housing: Low Risk  (03/25/2022)  Transportation Needs: No Transportation Needs (03/25/2022)  Utilities: Not At Risk (03/25/2022)  Depression (PHQ2-9): Low Risk  (12/24/2020)  Tobacco Use: Low Risk  (03/25/2022)     Readmission Risk Interventions     No data to display

## 2022-03-25 NOTE — Assessment & Plan Note (Addendum)
Pt asymptomatic and urinalysis was unremarkable. Likely related to her diabetes in the setting of hypertensive urgency.

## 2022-03-25 NOTE — Evaluation (Signed)
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  She was able to do bed mobility and transfers w/o direct assistance, though she was slow and hesitant with transitions and needed considerable cuing.  She typically uses a SPC (micro tripod base) for mobility but showed weakness and stability issues and PT encouraged ambulation with RW this date.  She was able to ambulate ~125 ft with good confidence, nearly symmetrical cadence w/o excessive UE use.  She reports feeling close to her baseline, but did have some hesitancy/decreased quality of movement with R LE swing through/weight acceptance/etc without buckling or stagger steps.  Pt will benefit from continued PT here and at home to address functional limitations.       Recommendations for follow up therapy are one component of a multi-disciplinary discharge planning process, led by the attending physician.  Recommendations may be updated based on patient status, additional functional criteria and insurance authorization.  Follow Up Recommendations Home health PT      Assistance Recommended at Discharge Intermittent Supervision/Assistance  Patient can return home with the  following  A little help with walking and/or transfers;Assistance with cooking/housework;A little help with bathing/dressing/bathroom;Assist for transportation    Equipment Recommendations None recommended by PT  Recommendations for Other Services       Functional Status Assessment Patient has had a recent decline in their functional status and demonstrates the ability to make significant improvements in function in a reasonable and predictable amount of time.     Precautions / Restrictions Precautions Precautions: Fall Restrictions Weight Bearing Restrictions: No      Mobility  Bed Mobility Overal bed mobility: Needs Assistance Bed Mobility: Supine to Sit     Supine to sit: Supervision, HOB elevated          Transfers Overall transfer level: Needs assistance Equipment used: Rolling walker (2 wheels) Transfers: Sit to/from Stand Sit to Stand: Min guard, From elevated surface           General transfer comment: Pt unable to rise with self selected strategy of b/l UEs on walker, cued for appropriate UE use and she did manage to rise w/o assist from elevated (~2") bed    Ambulation/Gait Ambulation/Gait assistance: Min guard Gait Distance (Feet): 125 Feet Assistive device: Rolling walker (2 wheels)         General Gait Details: Pt reports taht her cadence is not too far from her baseline.  She was not excessively reliant on the walker but did need b/l UEs on something.  She had occasional issues with R LE coordination/foot placement but no LOBs, excessive fatigue or overt safety issues  Stairs            Wheelchair Mobility    Modified Rankin (Stroke Patients Only)       Balance  Pertinent Vitals/Pain Pain Assessment Pain Assessment:  (reports vague occiptical head aches)    Home Living Family/patient expects to be discharged to:: Private residence Living Arrangements:  Children Available Help at Discharge: Family;Available 24 hours/day Type of Home: House Home Access: Stairs to enter Entrance Stairs-Rails: Psychiatric nurse of Steps: 3   Home Layout: One level Home Equipment: Conservation officer, nature (2 wheels);Cane - quad;BSC/3in1;Shower seat;Grab bars - tub/shower;Hand held shower head;Cane - single point Additional Comments: lives with daugther, son in Sports coach, two young grand children    Prior Function Prior Level of Function : Independent/Modified Independent;Needs assist             Mobility Comments: amb wtih SPC, reports she goes out/to the store with daughter maybe 1x/month ADLs Comments: MOD I in ADL/IADL with compensatory techniques, AE; does not drive     Hand Dominance   Dominant Hand: Right    Extremity/Trunk Assessment   Upper Extremity Assessment Upper Extremity Assessment: RUE deficits/detail;LUE deficits/detail RUE Deficits / Details: baseline RUE deficits- AROM; shoudler flexion to approx 70 degrees, elbow flexion/extension, globally weak throughout; pt reports mild weakness compared to baseline RUE deficits RUE:  (pain with PROM past 90 degres; all  other PROM WFL) RUE Sensation: WNL RUE Coordination: decreased fine motor;decreased gross motor LUE Deficits / Details: shoulder AROM approx 3/4 full AROM, all other WFL, 3+/5 shoulder flexion, elbow, wrist/hand 4-/5    Lower Extremity Assessment Lower Extremity Assessment: RLE deficits/detail (L LE grossly 4/5) RLE Deficits / Details: chronic R LE weakness - grossly 3+/5       Communication   Communication: Expressive difficulties (reports better than last night, still with consistent delay, intermittently fumbling/stalling sentences)  Cognition Arousal/Alertness: Awake/alert Behavior During Therapy: WFL for tasks assessed/performed Overall Cognitive Status: Impaired/Different from baseline Area of Impairment: Problem solving                              Problem Solving: Slow processing          General Comments General comments (skin integrity, edema, etc.): vital signs appear stable throughout    Exercises     Assessment/Plan    PT Assessment Patient needs continued PT services  PT Problem List Decreased strength;Decreased balance;Decreased mobility;Decreased coordination;Decreased activity tolerance;Decreased knowledge of use of DME;Decreased safety awareness       PT Treatment Interventions DME instruction;Gait training;Stair training;Functional mobility training;Therapeutic activities;Therapeutic exercise;Balance training;Patient/family education;Neuromuscular re-education    PT Goals (Current goals can be found in the Care Plan section)  Acute Rehab PT Goals PT Goal Formulation: With patient Time For Goal Achievement: 04/07/22 Potential to Achieve Goals: Good    Frequency Min 2X/week     Co-evaluation               AM-PAC PT "6 Clicks" Mobility  Outcome Measure Help needed turning from your back to your side while in a flat bed without using bedrails?: A Little Help needed moving from lying on your back to sitting on the side of a flat bed without using bedrails?: A Little Help needed moving to and from a bed to a chair (including a wheelchair)?: A Little Help needed standing up from a chair using your arms (e.g., wheelchair or bedside chair)?: A Little Help needed to walk in hospital room?: A Little Help needed climbing 3-5 steps with a railing? : A Little 6 Click Score: 18    End of Session Equipment Utilized During Treatment: Gait  belt Activity Tolerance: Patient tolerated treatment well Patient left: with chair alarm set;with call bell/phone within reach   PT Visit Diagnosis: Muscle weakness (generalized) (M62.81);Difficulty in walking, not elsewhere classified (R26.2);Other symptoms and signs involving the nervous system (R29.898)    Time: 1040-1105 PT Time Calculation (min) (ACUTE ONLY): 25  min   Charges:   PT Evaluation $PT Eval Low Complexity: 1 Low PT Treatments $Gait Training: 8-22 mins        Kreg Shropshire, DPT 03/25/2022, 1:05 PM

## 2022-03-25 NOTE — Consult Note (Signed)
NEURO HOSPITALIST CONSULT NOTE   Requestig physician: Dr. Denton Lank  Reason for Consult: Expressive dysphasia  History obtained from:  Patient and Chart     HPI:                                                                                                                                          Tami Jones is an 67 y.o. female with a PMHx anxiety, morbid obesity, asthma, right occipital CVA in 2008, stroke with residual deficit of right sided weakness, DM, GERD, HLD and HTN who presented to the ED via EMS overnight with new symptoms of aphasia manifesting as word-finding difficulty as well as "slow processing" per daughter. Symptoms started at 11 PM. The patient had been lying in bed trying to go to sleep when she developed bilateral foot tingling and a sensation of heaviness in her head. She reported feeling confused. Family endorsed a possible "panic attack" as well. The patient's BP per EMS was 220/120. On arrival she was alert and oriented x 4 and continued to complain of a sensation of heaviness to the back of her head. She endorsed having occipital headache with dizziness or blurred vision.  She also stated that she was feeling better after arriving to the ED.   ED Course: - CBC was normal and metabolic panel unremarkable. Urinalysis was not suggestive of an infection.  - CT head revealed encephalomalacia in the posterior occipital lobes corresponding to previous infarcts seen on prior studies. No acute abnormality was appreciated.  - Initial BP in the ED also elevated at 221/103, with HR 102. There was some concern for hypertensive emergency; she received 20 mg IV labetalol. She was also given 0.5 mg po Xanax as well as Tylenol. Her BP dropped to 143, with concomitant mild worsening of her expressive dysphasia. Decision was then made to admit her to the floor.   At baseline she walks with a walker or cane. She lives at home with her family. Home medications include  ASA, Plavix and atorvastatin.   Past Medical History:  Diagnosis Date   Anxiety    Asthma    CVA (cerebral vascular accident) (HCC) 03/2006   right occipital, Dr. Thad Ranger   Diabetes mellitus    GERD (gastroesophageal reflux disease)    Hyperlipidemia    Hypertension     Past Surgical History:  Procedure Laterality Date   CARDIOVASCULAR STRESS TEST  1/15   myoview. EF 60%   CESAREAN SECTION     ESOPHAGOGASTRODUODENOSCOPY  05/2005   TONSILLECTOMY AND ADENOIDECTOMY      Family History  Problem Relation Age of Onset   Coronary artery disease Mother    Diabetes Mellitus II Mother    Heart attack Mother 44   Diabetes Mellitus II Maternal Grandmother  Social History:  reports that she has never smoked. She has never been exposed to tobacco smoke. She has never used smokeless tobacco. She reports that she does not drink alcohol and does not use drugs.  Allergies  Allergen Reactions   Citalopram Other (See Comments)    Feels odd   Doxycycline Hyclate Nausea And Vomiting   Erythromycin Other (See Comments)    Irritated stomach   Montelukast Sodium Itching   Nitrofurantoin Nausea And Vomiting   Sulfa Antibiotics Nausea And Vomiting   Tramadol Hcl Other (See Comments)    Feels odd   Venlafaxine Hcl Other (See Comments)    Feels odd   Amoxicillin-Pot Clavulanate Nausea And Vomiting   Clarithromycin Rash     See ER note 02/2017    MEDICATIONS:                                                                                                                     Prior to Admission:  Medications Prior to Admission  Medication Sig Dispense Refill Last Dose   acetaminophen (TYLENOL) 325 MG tablet Take 650 mg by mouth every 6 (six) hours as needed.   prn at prn   albuterol (VENTOLIN HFA) 108 (90 Base) MCG/ACT inhaler Inhale 2 puffs into the lungs every 6 (six) hours as needed for wheezing or shortness of breath. 8 g 2    ALPRAZolam (XANAX) 0.25 MG tablet Take 1 tablet  (0.25 mg total) by mouth 3 (three) times daily as needed for anxiety or sleep. 30 tablet 0    amLODipine (NORVASC) 10 MG tablet Take 1 tablet (10 mg total) by mouth every evening. 90 tablet 3    aspirin EC 81 MG tablet Take 81 mg by mouth every evening.       atorvastatin (LIPITOR) 80 MG tablet Take 1 tablet (80 mg total) by mouth every evening. 90 tablet 3    clopidogrel (PLAVIX) 75 MG tablet TAKE 1 TABLET BY MOUTH EVERY DAY 90 tablet 3    glipiZIDE (GLUCOTROL XL) 5 MG 24 hr tablet Take 1 tablet (5 mg total) by mouth in the morning and at bedtime. 180 tablet 3    metoprolol succinate (TOPROL-XL) 25 MG 24 hr tablet TAKE 1 TABLET BY MOUTH EVERY DAY 90 tablet 0    pantoprazole (PROTONIX) 40 MG tablet Take 1 tablet (40 mg total) by mouth daily. 90 tablet 3    Scheduled:  [START ON 03/26/2022]  stroke: early stages of recovery book   Does not apply Once   amLODipine  10 mg Oral QPM   aspirin EC  81 mg Oral QPM   atorvastatin  80 mg Oral QPM   clopidogrel  75 mg Oral Daily   enoxaparin (LOVENOX) injection  0.5 mg/kg Subcutaneous Q24H   glipiZIDE  5 mg Oral BID   insulin aspart  0-15 Units Subcutaneous TID WC   metoprolol succinate  25 mg Oral Daily   pantoprazole  40 mg Oral Daily   Continuous:  sodium chloride 100 mL/hr at 03/25/22 0352    ROS:                                                                                                                                       As per HPI   Blood pressure (!) 140/71, pulse 69, temperature 98.1 F (36.7 C), resp. rate 16, height 5\' 3"  (1.6 m), weight 104.3 kg, SpO2 100 %.   General Examination:                                                                                                       Physical Exam HEENT-  [place]/AT   Lungs- Respirations unlabored Extremities- Warm and well-perfused  Neurological Examination Mental Status: Awake and alert. Oriented x 5. Thought content appropriate.  Affect euthymic. Speech fluent with intact  naming and comprehension.  Able to follow all commands without difficulty. No dysarthria noted.  Cranial Nerves: II:  OS: Visual fields mildly constricted in nasal quadrants of left eye; crescentic visual field cut also present in temporal visual fields of left eye; intact central vision and normally reactive pupil.  OD: Right eye with grossly visible lens opacity, decrease pupillary reactivity to light and severely decreased visual acuity to light/dark and gross shapes only, with inability to count fingers.  III,IV, VI: No ptosis. EOMI. No nystagmus. V: Temp sensation equal bilaterally VII: Smile symmetric VIII: Hearing intact to voice IX,X: No hypophonia or hoarseness XI: Symmetric XII: Midline tongue extension Motor: RUE: 4+/5 RLE: 4/5 LUE: 5/5 LLE: 5/5 Sensory: Temp and FT intact x 4. No extinction to DSS. Deep Tendon Reflexes: 2+ and symmetric bilateral brachioradialis and patellae Cerebellar: No ataxia with FNF bilaterally Gait: Able to stand with own power with some difficulty. Ambulates with a walker slowly with slightly slower and lower amplitude movements of RLE.   Lab Results: Basic Metabolic Panel: Recent Labs  Lab 03/25/22 0033 03/25/22 0423  NA 137 136  K 4.0 3.8  CL 103 104  CO2 25 24  GLUCOSE 175* 202*  BUN 18 17  CREATININE 1.12* 1.04*  CALCIUM 8.8* 8.3*    CBC: Recent Labs  Lab 03/25/22 0033 03/25/22 0423  WBC 7.2 6.2  NEUTROABS 4.1  --   HGB 13.9 12.4  HCT 42.5 38.1  MCV 91.0 91.1  PLT 267 241    Cardiac Enzymes: No results for input(s): "CKTOTAL", "CKMB", "CKMBINDEX", "TROPONINI" in the last 168 hours.  Lipid Panel: Recent Labs  Lab 03/25/22  0423  CHOL 220*  TRIG 112  HDL 37*  CHOLHDL 5.9  VLDL 22  LDLCALC 161*    Imaging: MR BRAIN WO CONTRAST  Result Date: 03/25/2022 CLINICAL DATA:  Initial evaluation for acute TIA. EXAM: MRI HEAD WITHOUT CONTRAST TECHNIQUE: Multiplanar, multiecho pulse sequences of the brain and surrounding  structures were obtained without intravenous contrast. COMPARISON:  Comparison made with prior CT from earlier the same day as well as previous studies. FINDINGS: Brain: Cerebral volume within normal limits. Patchy T2/FLAIR hyperintensity involving the periventricular, deep, and subcortical white matter both cerebral hemispheres, most characteristic of chronic microvascular ischemic disease. Few remote lacunar infarcts present about the left greater than right basal ganglia. Chronic bilateral PCA territory infarcts involving the occipital lobes noted. No evidence for acute or subacute ischemia. Gray-white matter differentiation otherwise maintained. No acute intracranial hemorrhage. Few punctate chronic micro hemorrhages noted, likely hypertensive/small vessel related. No mass lesion, midline shift or mass effect. No hydrocephalus or extra-axial fluid collection. Pituitary gland and suprasellar region within normal limits. Vascular: Major intracranial vascular flow voids are maintained. Skull and upper cervical spine: Craniocervical junction within normal limits. Bone marrow signal intensity within normal limits for no scalp soft tissue abnormality. Sinuses/Orbits: Globes and orbital soft tissues within normal limits. Paranasal sinuses are largely clear. No significant mastoid effusion. Other: None. IMPRESSION: 1. No acute intracranial abnormality. 2. Chronic bilateral PCA territory infarcts involving both occipital lobes. 3. Underlying chronic microvascular ischemic disease with a few remote lacunar infarcts about the left greater than right basal ganglia. Electronically Signed   By: Jeannine Boga M.D.   On: 03/25/2022 02:52   CT HEAD WO CONTRAST  Result Date: 03/25/2022 CLINICAL DATA:  Aphasia and vision blurring on the left side, resolving. EXAM: CT HEAD WITHOUT CONTRAST TECHNIQUE: Contiguous axial images were obtained from the base of the skull through the vertex without intravenous contrast. RADIATION  DOSE REDUCTION: This exam was performed according to the departmental dose-optimization program which includes automated exposure control, adjustment of the mA and/or kV according to patient size and/or use of iterative reconstruction technique. COMPARISON:  CT 08/27/2019.  MRI 08/25/2019 FINDINGS: Brain: Encephalomalacia in the posterior occipital lobes corresponding to previous infarcts seen on prior studies. No mass-effect or midline shift. No abnormal extra-axial fluid collections. Ventricles are not dilated. Basal cisterns are not effaced. No acute intracranial hemorrhage. Vascular: No hyperdense vessel or unexpected calcification. Skull: Calvarium appears intact. Sinuses/Orbits: Paranasal sinuses and mastoid air cells are clear. Other: None. IMPRESSION: No acute intracranial abnormalities. Old bilateral occipital infarcts similar to prior study. Electronically Signed   By: Lucienne Capers M.D.   On: 03/25/2022 01:13     Assessment: 67 year old female with a prior history of occipital lobe strokes presenting with waxing/waning expressive dysphasia in conjunction with severely elevated BP of 221/103 and tachycardia. The patient's home medications include ASA, Plavix and atorvastatin.  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 Aphasia is now resolved.  - TTE: LVEF 55 to 60%. The left ventricle has normal function. The left ventricle has no regional  wall motion abnormalities. There is mild to moderate left ventricular hypertrophy. Left ventricular diastolic  parameters are consistent with Grade I diastolic dysfunction (impaired relaxation). Left atrial size was mildly dilated. The mitral valve is normal in structure. Aortic valve sclerosis is  present, with no evidence of aortic  valve stenosis. No evidence of any  interatrial shunt.  - CT head: Old bilateral occipital infarcts similar to prior study. - MRI brain: No acute intracranial abnormality. Chronic bilateral PCA territory infarcts involving both occipital lobes. Underlying chronic microvascular ischemic disease with a few remote lacunar infarcts about the left greater than right basal ganglia.  - Findings on exam are referable to the chronic bilateral occipital lobe infarcts seen on MRI, as well as a chronic lacunar infarction in the white matter tracts just dorsal to the left internal capsule.  - DDx for transient aphasia:  - There was some concern for hypertensive encephalopathy while in the ED, but speech symptoms transiently worsened with lowering of BP. Her speech is now back to baseline.  - Also possible is left MCA territory TIA  Recommendations: - Continue to patient on maximal medical therapy for stroke prevention: ASA, Plavix and atorvastatin.  - HgbA1c, fasting lipid panel - CTA of head and neck - PT consult, OT consult, Speech consult - Cardiac telemetry - Risk factor modification - Frequent neuro checks  Electronically signed: Dr. Kerney Elbe 03/25/2022, 8:21 AM

## 2022-03-25 NOTE — Evaluation (Signed)
Speech Language Pathology Evaluation Patient Details Name: Tami Jones MRN: [id number] DOB: Feb 01, 1956 Today's Date: 03/25/2022 Time: 6213-0865 SLP Time Calculation (min) (ACUTE ONLY): 44 min  Problem List:  Patient Active Problem List   Diagnosis Date Noted   TIA (transient ischemic attack) 03/25/2022   Hypertensive urgency 03/25/2022   Asthma, chronic, unspecified asthma severity, with acute exacerbation 03/25/2022   Expressive dysphasia 03/25/2022   RUQ pain 12/28/2021   Morbid obesity ([place]) 12/28/2021   Seizure disorder ([place]) 12/28/2021   Urinary frequency 11/27/2019   History of CVA with residual deficit 08/25/2019   Anxiety    Dyslipidemia    Controlled type 2 diabetes mellitus without complication, without long-term current use of insulin ([place])    CKD (chronic kidney disease), stage IIIa    Neuropathic pain, arm 10/26/2018   Hemiparesis of right dominant side ([place]) 08/17/2018   Cerebrovascular disease 07/17/2014   Preventative health care 06/25/2014   Right sided sciatica 03/08/2013   MENOPAUSAL SYNDROME 09/10/2008   Mood disorder ([place]) 09/15/2007   Type 2 diabetes mellitus with other circulatory complications ([place]) 78/46/9629   HYPERCHOLESTEROLEMIA 06/17/2006   GERD 06/17/2006   Past Medical History:  Past Medical History:  Diagnosis Date   Anxiety    Asthma    CVA (cerebral vascular accident) ([place]) 03/2006   right occipital, Dr. Doy Mince   Diabetes mellitus    GERD (gastroesophageal reflux disease)    Hyperlipidemia    Hypertension    Past Surgical History:  Past Surgical History:  Procedure Laterality Date   CARDIOVASCULAR STRESS TEST  1/15   myoview. EF 60%   CESAREAN SECTION     ESOPHAGOGASTRODUODENOSCOPY  05/2005   TONSILLECTOMY AND ADENOIDECTOMY     HPI:  Pt is a 67 y.o. Caucasian female with medical history significant for anxiety attacks, asthma, type 2 diabetes mellitus, Obesity, CVA with residual right-sided weakness, GERD, hypertension and  dyslipidemia who presented to the emergency room with acute onset of expressive word finding difficulty that started at 11 PM.  She reported laying in bed tonight and trying to go to sleep when she developed bilateral foot tingling and heaviness sensation in her head.  She reported feeling confused as well.  This has resolved by the time she came to the ER and her speech was back to normal within less than an hour.  She denied any other paresthesias or new focal muscle weakness. She has been having occipital headache with dizziness.  Pt endorsed having episodes "where i lose my train of thought since my last stroke".  MRI: No acute intracranial abnormality.  2. Chronic bilateral PCA territory infarcts involving both occipital  lobes.  3. Underlying chronic microvascular ischemic disease with a few  remote lacunar infarcts about the left greater than right basal  ganglia. On a Regular diet.   Assessment / Plan / Recommendation Clinical Impression   Patient completed informal language evaluation today using most aspects of the Bedside Western Aphasia Battery (B-WAB). Pt reported Fatigue after having not slept last night/since admit to the ED; "I just got to this room and got some sleep at 5am today". Pt wears Reading Glasses.  The following scores noted: Content (10/10), Fluency 10/10), Auditory Verbal Comprehension (10/10), Following 2-3 Step Commands (9/10), Repetition (9/10 - needed repetition of the long sentence 1x), Naming (10/10) revealing functional expressive/receptive language skills seen in general language tasks. Patient did not appear to demonstrate any difficulty with Verbal Apraxia or Motor Speech skills. Of note, patient was able to  complete object function and attributes tasks including similarities/differences w/ good accuracy; exhibited min slower processing during more complex tasks of similarities/differences. Patient's speech was c/b functional verbal output w/ no overt paraphasias nor  word-finding difficulties. She conversed w/ the Pharmacist and answered questions re: her Medications w/ accuracy for him.  However, pt endorsed having episodes "where I lose my train of thought sometimes since my last stroke". Patient was able to read at [place] phrase length of information re: the menu and how to order the meals w/ appropriate attention and scanning. Pt wears Reading Glasses. Cognitive status appeared [facility] w/ appropriate Orientation, general problem-solving(awareness of need for 911 in emergency situations), and awareness of her health deficits and how they impact her functional abilities w/ ADLs in the home -- encouraged her to always maintain communication w/ her PCP re: such.    Pt made aware of results of assessment, changes to functional communication following illness/stroke - even previous stroke impact, basic communication strategies including word-finding strategies using description and reducing distractions during communication, and SLP POC to include f/u w/ Outpatient services for any further needs to increase overall communication and communication effectiveness in ADLs if needed. Pt verbalized understanding. May benefit from PRN support in the home. NSG/TOC/MD made aware of results above and recommendations.    SLP Assessment  SLP Recommendation/Assessment: All further Speech Lanaguage Pathology  needs can be addressed in the next venue of care SLP Visit Diagnosis: Cognitive communication deficit (R41.841)    Recommendations for follow up therapy are one component of a multi-disciplinary discharge planning process, led by the attending physician.  Recommendations may be updated based on patient status, additional functional criteria and insurance authorization.    Follow Up Recommendations  Follow physician's recommendations for discharge plan and follow up therapies    Assistance Recommended at Discharge  PRN  Functional Status Assessment Patient has had a recent  decline in their functional status and demonstrates the ability to make significant improvements in function in a reasonable and predictable amount of time.  Frequency and Duration  (n/a)   (n/a)      SLP Evaluation Cognition  Overall Cognitive Status: Within Functional Limits for tasks assessed (for bedside screening) Arousal/Alertness: Awake/alert Orientation Level: Oriented X4 Year: 2024 Month: February Attention: Focused;Sustained Focused Attention: Appears intact Sustained Attention: Appears intact (at bedside) Memory: Appears intact (for recall of visitors, dinner meal items ordered today) Awareness: Appears intact (re: situation w/ her medications, blood pressure, and appropriate questions re: the issue of possibly having had a new stroke or not(as per MD today).) Problem Solving: Appears intact Executive Function: Reasoning;Decision Making Reasoning: Appears intact (pictured scenario) Decision Making: Appears intact (pictured scenario) Behaviors:  (fatigue -- "I have not slept much since yesterday") Safety/Judgment: Appears intact (could id need to call 911 in emergency situations)       Comprehension  Auditory Comprehension Overall Auditory Comprehension: Appears within functional limits for tasks assessed Yes/No Questions: Within Functional Limits Commands: Within Functional Limits (2-3 step commands) Conversation: Complex Other Conversation Comments: had a conversation w/ the Pharmacist re: her medications/doses/when she took them at end of this evaluation Interfering Components:  (fatigue) Visual Recognition/Discrimination Discrimination: Not tested Reading Comprehension Reading Status: Within funtional limits (the menu - answered questions)    Expression Expression Primary Mode of Expression: Verbal Verbal Expression Overall Verbal Expression: Appears within functional limits for tasks assessed Initiation: No impairment Automatic Speech:  (WFL) Level of  Generative/Spontaneous Verbalization: Conversation Repetition: No impairment (asked for repetition of the  lengthy sentence (1x)) Naming: No impairment Pragmatics: No impairment Interfering Components:  (fatigue) Non-Verbal Means of Communication: Not applicable Written Expression Dominant Hand: Right Written Expression: Not tested   Oral / Motor  Oral Motor/Sensory Function Overall Oral Motor/Sensory Function: Within functional limits Motor Speech Overall Motor Speech: Appears within functional limits for tasks assessed Respiration: Within functional limits Phonation: Normal Resonance: Within functional limits Articulation: Within functional limitis Intelligibility: Intelligible Motor Planning: Witnin functional limits Motor Speech Errors: Not applicable               Orinda Kenner, MS, CCC-SLP Speech Language Pathologist Rehab Services; [place] [phone] (ascom) Marijayne Rauth 03/25/2022, 4:27 PM

## 2022-03-25 NOTE — Assessment & Plan Note (Addendum)
Covered with sliding scale Novolog. Resume home regimen at d/c.

## 2022-03-25 NOTE — Progress Notes (Signed)
Nutrition Brief Note  Patient identified on the Malnutrition Screening Tool (MST) Report  Wt Readings from Last 15 Encounters:  03/25/22 104.3 kg  12/28/21 109.8 kg  11/27/21 109.4 kg  06/23/21 112.9 kg  05/15/21 111.6 kg  02/25/21 108.4 kg  12/24/20 110.7 kg  12/18/20 108.9 kg  12/12/20 108.4 kg  11/10/20 111.6 kg  08/25/20 108.9 kg  02/05/20 105.2 kg  12/20/19 108.4 kg  11/27/19 109.3 kg  11/15/19 110.2 kg   Pt admitted with TIA.  Reviewed I/O's: +112 ml x 24 hours  Spoke with pt at bedside, who reports feeling better today but complains of light headedness and discomfort in the back of her head. She has not eaten today as she was interrupted with transfers.   Pt reports that her appetite has been decreased, but also has been trying to reduce excess snacking (pt lives with her grandchildren ages 14 and 35). Per pt, she has lost 14# over the past 3 months. Reviewed wt hx; pt has experienced a 4.6% wt loss over the past 4 months, which is significant for time frame.   Nutrition-Focused physical exam completed. Findings are no fat depletion, no muscle depletion, and no edema.    Medications reviewed and include 0.9% sodium chloride infusion.   Lab Results  Component Value Date   HGBA1C 8.0 (A) 12/28/2021   PTA DM medications are 5 mg glipizide BID.   Labs reviewed: CBGS: [phone] (inpatient orders for glycemic control are 5 mg glipizide BID and 0-15 units insulin aspart TID).    Body mass index is 40.74 kg/m. Patient meets criteria for obesity, class III based on current BMI. Obesity is a complex, chronic medical condition that is optimally managed by a multidisciplinary care team. Weight loss is not an ideal goal for an acute inpatient hospitalization. However, if further work-up for obesity is warranted, consider outpatient referral to [facility].     Current diet order is carb modified, patient is consuming approximately n/a% of  meals at this time. Labs and medications reviewed.   No nutrition interventions warranted at this time. If nutrition issues arise, please consult RD.   Loistine Chance, RD, LDN, [place] Registered Dietitian II Certified Diabetes Care and Education Specialist Please refer to [facility] for RD and/or RD on-call/weekend/after hours pager

## 2022-03-25 NOTE — Assessment & Plan Note (Addendum)
Continue statin therapy.

## 2022-03-25 NOTE — H&P (Addendum)
[place]   PATIENT NAME: Tami Jones    MR#:  [id number]  DATE OF BIRTH:  06-19-1955  DATE OF ADMISSION:  03/25/2022  PRIMARY CARE PHYSICIAN: Venia Carbon, MD   Patient is coming from: Home  REQUESTING/REFERRING PHYSICIAN: Vladimir Crofts, MD  CHIEF COMPLAINT:   Chief Complaint  Patient presents with   Aphasia    HISTORY OF PRESENT ILLNESS:  Tami Jones is a 67 y.o. Caucasian female with medical history significant for asthma, type 2 diabetes mellitus, CVA with residual right-sided weakness, GERD, hypertension and dyslipidemia as well as anxiety, who presented to the emergency room with acute onset of expressive dysphagia with word finding difficulty that started at 11 PM.  She reported laying in bed tonight and trying to go to sleep when she developed bilateral foot tingling and heaviness sensation in her head.  She reported feeling confused as well.  This has resolved by the time she came to the ER and her speech was back to normal within less than an hour.  She denied any other paresthesias or new focal muscle weakness. She has been having occipital headache with dizziness or blurred vision. No urinary or stool incontinence. She has been having urinary frequency which she attributes to elevated blood pressure without dysuria, hematuria or flank pain. No dizziness, presyncope or syncope.  No tinnitus or vertigo.  No chest pain or palpitations.  No cough or wheezing or dyspnea.  No fever or chills.  ED Course: Upon presentation to the emergency room, BP was 221/103 with a heart rate of 102 with otherwise normal vital signs.  Labs revealed unremarkable CMP and CBC.  Urinalysis showed specific gravity 1004, rare bacteria and 0-5 WBCs with negative nitrite and 100 protein with 50 glucose. EKG as reviewed by me : Normal sinus rhythm with a rate of 99 with low voltage QRS and inferior Q waves. Imaging: Noncontrast head CT scan revealed no acute intracranial normalities.  It  showed old bilateral occipital infarcts similar to prior study.  The patient was given 0.5 mg p.o. Xanax, 1 g p.o. Tylenol and 20 mg of IV labetalol. SBP dropped to 143. She experienced mild worsening of her expressive dysphasia. She will be admitted to an observation medical telemetry bed for further evaluation and management. PAST MEDICAL HISTORY:   Past Medical History:  Diagnosis Date   Anxiety    Asthma    CVA (cerebral vascular accident) ([place]) 03/2006   right occipital, Dr. Doy Mince   Diabetes mellitus    GERD (gastroesophageal reflux disease)    Hyperlipidemia    Hypertension     PAST SURGICAL HISTORY:   Past Surgical History:  Procedure Laterality Date   CARDIOVASCULAR STRESS TEST  1/15   myoview. EF 60%   CESAREAN SECTION     ESOPHAGOGASTRODUODENOSCOPY  05/2005   TONSILLECTOMY AND ADENOIDECTOMY      SOCIAL HISTORY:   Social History   Tobacco Use   Smoking status: Never    Passive exposure: Never   Smokeless tobacco: Never  Substance Use Topics   Alcohol use: No    Alcohol/week: 0.0 standard drinks of alcohol    Comment: wine occassionally    FAMILY HISTORY:   Family History  Problem Relation Age of Onset   Coronary artery disease Mother    Diabetes Mellitus II Mother    Heart attack Mother 5   Diabetes Mellitus II Maternal Grandmother     DRUG ALLERGIES:   Allergies  Allergen  Reactions   Citalopram Other (See Comments)    Feels odd   Doxycycline Hyclate Nausea And Vomiting   Erythromycin Other (See Comments)    Irritated stomach   Montelukast Sodium Itching   Nitrofurantoin Nausea And Vomiting   Sulfa Antibiotics Nausea And Vomiting   Tramadol Hcl Other (See Comments)    Feels odd   Venlafaxine Hcl Other (See Comments)    Feels odd   Amoxicillin-Pot Clavulanate Nausea And Vomiting   Clarithromycin Rash     See ER note 02/2017    REVIEW OF SYSTEMS:   ROS As per history of present illness. All pertinent systems were reviewed above.  Constitutional, HEENT, cardiovascular, respiratory, GI, GU, musculoskeletal, neuro, psychiatric, endocrine, integumentary and hematologic systems were reviewed and are otherwise negative/unremarkable except for positive findings mentioned above in the HPI.   MEDICATIONS AT HOME:   Prior to Admission medications   Medication Sig Start Date End Date Taking? Authorizing Provider  acetaminophen (TYLENOL) 325 MG tablet Take 650 mg by mouth every 6 (six) hours as needed.    [provider]  albuterol (VENTOLIN HFA) 108 (90 Base) MCG/ACT inhaler Inhale 2 puffs into the lungs every 6 (six) hours as needed for wheezing or shortness of breath. 12/18/20   Nance Pear, MD  ALPRAZolam Duanne Moron) 0.25 MG tablet Take 1 tablet (0.25 mg total) by mouth 3 (three) times daily as needed for anxiety or sleep. 12/24/20   Venia Carbon, MD  amLODipine (NORVASC) 10 MG tablet Take 1 tablet (10 mg total) by mouth every evening. 08/28/19   Loletha Grayer, MD  aspirin EC 81 MG tablet Take 81 mg by mouth every evening.     [provider]  atorvastatin (LIPITOR) 80 MG tablet Take 1 tablet (80 mg total) by mouth every evening. 06/23/21 06/23/22  Viviana Simpler I, MD  clopidogrel (PLAVIX) 75 MG tablet TAKE 1 TABLET BY MOUTH EVERY DAY 03/22/22   Venia Carbon, MD  glipiZIDE (GLUCOTROL XL) 5 MG 24 hr tablet Take 1 tablet (5 mg total) by mouth in the morning and at bedtime. 12/28/21   Venia Carbon, MD  metoprolol succinate (TOPROL-XL) 25 MG 24 hr tablet TAKE 1 TABLET BY MOUTH EVERY DAY 03/08/22   Viviana Simpler I, MD  pantoprazole (PROTONIX) 40 MG tablet Take 1 tablet (40 mg total) by mouth daily. 06/23/21   Venia Carbon, MD      VITAL SIGNS:  Blood pressure (!) 154/80, pulse 77, temperature 98 F (36.7 C), resp. rate 19, height 5\' 3"  (1.6 m), weight 104.3 kg, SpO2 100 %.  PHYSICAL EXAMINATION:  Physical Exam  GENERAL:  67 y.o.-year-old Caucasian female patient lying in the bed with no acute  distress.  EYES: Pupils equal, round, reactive to light and accommodation. No scleral icterus. Extraocular muscles intact.  HEENT: Head atraumatic, normocephalic. Oropharynx and nasopharynx clear.  NECK:  Supple, no jugular venous distention. No thyroid enlargement, no tenderness.  LUNGS: Normal breath sounds bilaterally, no wheezing, rales,rhonchi or crepitation. No use of accessory muscles of respiration.  CARDIOVASCULAR: Regular rate and rhythm, S1, S2 normal. No murmurs, rubs, or gallops.  ABDOMEN: Soft, nondistended, nontender. Bowel sounds present. No organomegaly or mass.  EXTREMITIES: No pedal edema, cyanosis, or clubbing.  NEUROLOGIC: Cranial nerves II through XII are intact except for mild residual expressive dysphasia.  She has mild right gaze preference and residual loss of peripheral vision.  Muscle strength 5/5 in left upper and lower extremities compared to 3-4/5 in the  right upper and lower extremities.. Sensation intact. Gait not checked.  PSYCHIATRIC: The patient is alert and oriented x 3.  Normal affect and good eye contact. SKIN: No obvious rash, lesion, or ulcer.   LABORATORY PANEL:   CBC Recent Labs  Lab 03/25/22 0033  WBC 7.2  HGB 13.9  HCT 42.5  PLT 267   ------------------------------------------------------------------------------------------------------------------  Chemistries  Recent Labs  Lab 03/25/22 0033  NA 137  K 4.0  CL 103  CO2 25  GLUCOSE 175*  BUN 18  CREATININE 1.12*  CALCIUM 8.8*  AST 26  ALT 26  ALKPHOS 75  BILITOT 0.7   ------------------------------------------------------------------------------------------------------------------  Cardiac Enzymes No results for input(s): "TROPONINI" in the last 168 hours. ------------------------------------------------------------------------------------------------------------------  RADIOLOGY:  MR BRAIN WO CONTRAST  Result Date: 03/25/2022 CLINICAL DATA:  Initial evaluation for acute  TIA. EXAM: MRI HEAD WITHOUT CONTRAST TECHNIQUE: Multiplanar, multiecho pulse sequences of the brain and surrounding structures were obtained without intravenous contrast. COMPARISON:  Comparison made with prior CT from earlier the same day as well as previous studies. FINDINGS: Brain: Cerebral volume within normal limits. Patchy T2/FLAIR hyperintensity involving the periventricular, deep, and subcortical white matter both cerebral hemispheres, most characteristic of chronic microvascular ischemic disease. Few remote lacunar infarcts present about the left greater than right basal ganglia. Chronic bilateral PCA territory infarcts involving the occipital lobes noted. No evidence for acute or subacute ischemia. Gray-white matter differentiation otherwise maintained. No acute intracranial hemorrhage. Few punctate chronic micro hemorrhages noted, likely hypertensive/small vessel related. No mass lesion, midline shift or mass effect. No hydrocephalus or extra-axial fluid collection. Pituitary gland and suprasellar region within normal limits. Vascular: Major intracranial vascular flow voids are maintained. Skull and upper cervical spine: Craniocervical junction within normal limits. Bone marrow signal intensity within normal limits for no scalp soft tissue abnormality. Sinuses/Orbits: Globes and orbital soft tissues within normal limits. Paranasal sinuses are largely clear. No significant mastoid effusion. Other: None. IMPRESSION: 1. No acute intracranial abnormality. 2. Chronic bilateral PCA territory infarcts involving both occipital lobes. 3. Underlying chronic microvascular ischemic disease with a few remote lacunar infarcts about the left greater than right basal ganglia. Electronically Signed   By: Rise Mu M.D.   On: 03/25/2022 02:52   CT HEAD WO CONTRAST  Result Date: 03/25/2022 CLINICAL DATA:  Aphasia and vision blurring on the left side, resolving. EXAM: CT HEAD WITHOUT CONTRAST TECHNIQUE:  Contiguous axial images were obtained from the base of the skull through the vertex without intravenous contrast. RADIATION DOSE REDUCTION: This exam was performed according to the departmental dose-optimization program which includes automated exposure control, adjustment of the mA and/or kV according to patient size and/or use of iterative reconstruction technique. COMPARISON:  CT 08/27/2019.  MRI 08/25/2019 FINDINGS: Brain: Encephalomalacia in the posterior occipital lobes corresponding to previous infarcts seen on prior studies. No mass-effect or midline shift. No abnormal extra-axial fluid collections. Ventricles are not dilated. Basal cisterns are not effaced. No acute intracranial hemorrhage. Vascular: No hyperdense vessel or unexpected calcification. Skull: Calvarium appears intact. Sinuses/Orbits: Paranasal sinuses and mastoid air cells are clear. Other: None. IMPRESSION: No acute intracranial abnormalities. Old bilateral occipital infarcts similar to prior study. Electronically Signed   By: Burman Nieves M.D.   On: 03/25/2022 01:13      IMPRESSION AND PLAN:  Assessment and Plan: * TIA (transient ischemic attack) - This was mainly manifested by expressive dysphasia. -- The patient will be admitted to an observation medically monitored bed.   - We will follow  neuro checks q.4 hours for 24 hours.   - The patient will be continued on aspirin and Plavix. .- Will obtain a brain MRI without contrast as well as bilateral carotid Doppler and 2D echo with bubble study .   - A neurology consultation  as well as physical/occupation/speech therapy consults will be obtained in a.m.Marland Kitchen - I notified Dr. Cheral Marker about the patient. - The patient will be placed on statin therapy and fasting lipids will be checked.    Hypertensive urgency - We will continue Norvasc and Toprol-XL with permissive parameters.  Dyslipidemia - We will continue statin therapy.  Controlled type 2 diabetes mellitus without  complication, without long-term current use of insulin ([place]) - The patient will be placed on supplement coverage with NovoLog. - Will continue Glucotrol XL. - She will be monitored for hypoglycemia.  Asthma, chronic, unspecified asthma severity, with acute exacerbation - We will continue as needed albuterol inhaler.  Anxiety - We will continue as needed Xanax.  Urinary frequency - Urinalysis was unremarkable. - This could be related to her diabetes in the setting of hypertensive urgency.    DVT prophylaxis: Lovenox.  Advanced Care Planning:  Code Status: full code. Family Communication:  The plan of care was discussed in details with the patient (and family). I answered all questions. The patient agreed to proceed with the above mentioned plan. Further management will depend upon hospital course. Disposition Plan: Back to previous home environment Consults called: Neurology. All the records are reviewed and case discussed with ED provider.  Status is: Observation  I certify that at the time of admission, it is my clinical judgment that the patient will require hospital care extending less than 2 midnights.                            Dispo: The patient is from: Home              Anticipated d/c is to: Home              Patient currently is not medically stable to d/c.              Difficult to place patient: No  Time spent: 60 minutes.Christel Mormon M.D on 03/25/2022 at 3:42 AM  [facility]   From 7 PM-7 AM, contact night-coverage [email or web address]  CC: Primary care physician; Venia Carbon, MD

## 2022-03-25 NOTE — Progress Notes (Signed)
*  PRELIMINARY RESULTS* Echocardiogram 2D Echocardiogram has been performed.  Tami Jones 03/25/2022, 9:32 AM

## 2022-03-25 NOTE — Assessment & Plan Note (Addendum)
- 

## 2022-03-26 ENCOUNTER — Observation Stay: Payer: Medicare HMO

## 2022-03-26 DIAGNOSIS — I639 Cerebral infarction, unspecified: Secondary | ICD-10-CM | POA: Diagnosis not present

## 2022-03-26 DIAGNOSIS — I672 Cerebral atherosclerosis: Secondary | ICD-10-CM | POA: Diagnosis not present

## 2022-03-26 DIAGNOSIS — I6611 Occlusion and stenosis of right anterior cerebral artery: Secondary | ICD-10-CM | POA: Diagnosis not present

## 2022-03-26 DIAGNOSIS — I674 Hypertensive encephalopathy: Secondary | ICD-10-CM | POA: Diagnosis not present

## 2022-03-26 DIAGNOSIS — G459 Transient cerebral ischemic attack, unspecified: Secondary | ICD-10-CM | POA: Diagnosis not present

## 2022-03-26 DIAGNOSIS — I6623 Occlusion and stenosis of bilateral posterior cerebral arteries: Secondary | ICD-10-CM | POA: Diagnosis not present

## 2022-03-26 LAB — GLUCOSE, CAPILLARY
Glucose-Capillary: 113 mg/dL — ABNORMAL HIGH (ref 70–99)
Glucose-Capillary: 187 mg/dL — ABNORMAL HIGH (ref 70–99)

## 2022-03-26 MED ORDER — IOHEXOL 350 MG/ML SOLN
75.0000 mL | Freq: Once | INTRAVENOUS | Status: AC | PRN
  Administered 2022-03-26: 75 mL via INTRAVENOUS

## 2022-03-26 NOTE — Discharge Summary (Signed)
Physician Discharge Summary   Patient: Tami Jones MRN: [id number] DOB: 09-21-1955  Admit date:     03/25/2022  Discharge date: {dischdate:26783}  Discharge Physician: Ezekiel Slocumb   PCP: Venia Carbon, MD   Recommendations at discharge:  {Tip this will not be part of the note when signed- Example include specific recommendations for outpatient follow-up, pending tests to follow-up on. (Optional):26781}  ***  Discharge Diagnoses: Principal Problem:   TIA (transient ischemic attack) Active Problems:   Hypertensive urgency   Dyslipidemia   Controlled type 2 diabetes mellitus without complication, without long-term current use of insulin (HCC)   Asthma, chronic, unspecified asthma severity, with acute exacerbation   Anxiety   Urinary frequency   Expressive dysphasia  Resolved Problems:   * No resolved hospital problems. [facility] Course: No notes on file  Assessment and Plan: * TIA (transient ischemic attack) - This was mainly manifested by expressive dysphasia. -- The patient will be admitted to an observation medically monitored bed.   - We will follow neuro checks q.4 hours for 24 hours.   - The patient will be continued on aspirin and Plavix. .- Will obtain a brain MRI without contrast as well as bilateral carotid Doppler and 2D echo with bubble study .   - A neurology consultation  as well as physical/occupation/speech therapy consults will be obtained in a.m.Marland Kitchen - I notified Dr. Cheral Marker about the patient. - The patient will be placed on statin therapy and fasting lipids will be checked.    Hypertensive urgency - We will continue Norvasc and Toprol-XL with permissive parameters.  Dyslipidemia - We will continue statin therapy.  Controlled type 2 diabetes mellitus without complication, without long-term current use of insulin ([place]) - The patient will be placed on supplement coverage with NovoLog. - Will continue Glucotrol XL. - She will be monitored for  hypoglycemia.  Asthma, chronic, unspecified asthma severity, with acute exacerbation - We will continue as needed albuterol inhaler.  Anxiety - We will continue as needed Xanax.  Urinary frequency - Urinalysis was unremarkable. - This could be related to her diabetes in the setting of hypertensive urgency.      {Tip this will not be part of the note when signed Body mass index is 40.74 kg/m. , ,  (Optional):26781}  {(NOTE) Pain control PDMP Statment (Optional):26782} Consultants: *** Procedures performed: ***  Disposition: {Plan; Disposition:26390} Diet recommendation:  {Diet_Plan:26776} DISCHARGE MEDICATION: Allergies as of 03/26/2022       Reactions   Citalopram Other (See Comments)   Feels odd   Doxycycline Hyclate Nausea And Vomiting   Erythromycin Other (See Comments)   Irritated stomach   Montelukast Sodium Itching   Nitrofurantoin Nausea And Vomiting   Sulfa Antibiotics Nausea And Vomiting   Tramadol Hcl Other (See Comments)   Feels odd   Venlafaxine Hcl Other (See Comments)   Feels odd   Amoxicillin-pot Clavulanate Nausea And Vomiting   Clarithromycin Rash    See ER note 02/2017        Medication List     TAKE these medications    acetaminophen 325 MG tablet Commonly known as: TYLENOL Take 650 mg by mouth every 6 (six) hours as needed.   albuterol 108 (90 Base) MCG/ACT inhaler Commonly known as: VENTOLIN HFA Inhale 2 puffs into the lungs every 6 (six) hours as needed for wheezing or shortness of breath.   ALPRAZolam 0.25 MG tablet Commonly known as: XANAX Take 1 tablet (0.25 mg total) by  mouth 3 (three) times daily as needed for anxiety or sleep.   amLODipine 10 MG tablet Commonly known as: NORVASC Take 1 tablet (10 mg total) by mouth every evening.   aspirin EC 81 MG tablet Take 81 mg by mouth every evening.   atorvastatin 80 MG tablet Commonly known as: Lipitor Take 1 tablet (80 mg total) by mouth every evening.   clopidogrel 75 MG  tablet Commonly known as: PLAVIX TAKE 1 TABLET BY MOUTH EVERY DAY   glipiZIDE 5 MG 24 hr tablet Commonly known as: GLUCOTROL XL Take 1 tablet (5 mg total) by mouth in the morning and at bedtime.   metoprolol succinate 25 MG 24 hr tablet Commonly known as: TOPROL-XL TAKE 1 TABLET BY MOUTH EVERY DAY   pantoprazole 40 MG tablet Commonly known as: PROTONIX Take 1 tablet (40 mg total) by mouth daily.        Discharge Exam: Filed Weights   03/25/22 0032  Weight: 104.3 kg   ***  Condition at discharge: {DC Condition:26389}  The results of significant diagnostics from this hospitalization (including imaging, microbiology, ancillary and laboratory) are listed below for reference.   Imaging Studies: CT ANGIO HEAD NECK W WO CM  Result Date: 03/26/2022 CLINICAL DATA:  Neuro deficit, acute, stroke suspected. EXAM: CT ANGIOGRAPHY HEAD AND NECK TECHNIQUE: Multidetector CT imaging of the head and neck was performed using the standard protocol during bolus administration of intravenous contrast. Multiplanar CT image reconstructions and MIPs were obtained to evaluate the vascular anatomy. Carotid stenosis measurements (when applicable) are obtained utilizing NASCET criteria, using the distal internal carotid diameter as the denominator. RADIATION DOSE REDUCTION: This exam was performed according to the departmental dose-optimization program which includes automated exposure control, adjustment of the mA and/or kV according to patient size and/or use of iterative reconstruction technique. CONTRAST:  64m OMNIPAQUE IOHEXOL 350 MG/ML SOLN COMPARISON:  CT MRI brain March 25, 2022. CT angiogram August 25, 2018. FINDINGS: CT HEAD FINDINGS Brain: No evidence of acute infarction, hemorrhage, hydrocephalus, extra-axial collection or mass lesion/mass effect. 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 Vascular: Calcified plaques in the bilateral carotid siphons. Skull: Normal. Negative for  fracture or focal lesion. Sinuses/Orbits: No acute finding. Other: None. Review of the MIP images confirms the above findings CTA NECK FINDINGS Aortic arch: Standard branching. Imaged portion shows no evidence of aneurysm or dissection. Mild atherosclerotic changes in the visualized portion of the aortic arch and at the origin of the major arch vessels without hemodynamically significant stenosis. Right carotid system: Mild atherosclerotic changes at the right carotid bifurcation without stenosis. Retropharyngeal course of the mid cervical right ICA. Left carotid system: Mild atherosclerotic changes at the left carotid bifurcation without stenosis. Retropharyngeal course of the mid cervical left ICA. Vertebral arteries: Normal course and caliber of the V1 and V2 segments of the dominant left vertebral artery with approximately 60% stenosis of the V3 segment at the dural margin. Diminutive right vertebral artery which appear occluded at the origin from the right subclavian artery with intermittent opacification along the neck, likely from collateral circulation from muscular branches. This is likely a combination of hypoplastic vertebral artery with superimposed atherosclerosis versus remote dissection. This appears similar to prior CTA. Skeleton: A 5 mm well-defined lytic lesion the right side of the C3 vertebral body, unchanged when compared to prior CT performed in 2021, likely benign. Other neck: Small cystic appearing lesion in the right submandibular gland. Upper chest: Negative. Review of the MIP images confirms the  above findings CTA HEAD FINDINGS Anterior circulation: Calcified atherosclerotic changes of the bilateral internal carotid arteries with approximately is 55% stenosis of the right ICA at the horizontal cavernous segment and 55% stenosis of the left ICA at the paraclinoid segment. Diffuse luminal irregularity along the bilateral ACA and MCA vascular trees with multifocal areas of mild stenosis.  Short segment of moderate stenosis is seen at the origin of a left M2/MCA inferior division branch. Severe stenosis at the origin of the right A1/ACA. Posterior circulation: The hypoplastic right vertebral artery functionally ends in PICA with the distal V4 segment showing minimal opacification with occlusion near the vertebrobasilar junction. Atherosclerotic changes of the intracranial left vertebral artery with approximately 55% stenosis distal to the left PICA takeoff. Diffuse luminal irregularity of the basilar artery consistent with atherosclerotic disease with diffusely small caliber. There is approximately 40% stenosis at the mid basilar segment. Diffuse luminal irregularity of the bilateral posterior cerebral arteries which have small caliber and multifocal areas of stenosis, severe at the left P2 segment and at the right proximal P1 and P1-P2 junction. Venous sinuses: Patent. Anatomic variants: None significant. Review of the MIP images confirms the above findings IMPRESSION: 1. No acute intracranial abnormality. 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 2. No intracranial large vessel occlusion. 3. Intracranial atherosclerotic disease with multifocal areas of stenosis involving the anterior and posterior circulation. Severe stenosis at the origin of the right A1/ACA. Severe stenosis at the left P2 segment and right proximal P1 and P1-P2 junction. 4. Atherosclerotic changes of the bilateral carotid bifurcations without stenosis. 5. Diminutive right vertebral artery which appear occluded at the origin from the right subclavian artery with intermittent opacification along the neck, likely from collateral circulation from muscular branches. This is likely a combination of hypoplastic vertebral artery with superimposed atherosclerosis versus remote dissection. This appears similar to prior CTA. 6. A 5 mm well-defined lytic lesion the right side of the C3 vertebral body, unchanged when  compared to prior CT performed in 2021, likely benign. Aortic Atherosclerosis (ICD10-I70.0). Electronically Signed   By: Pedro Earls M.D.   On: 03/26/2022 10:56   ECHOCARDIOGRAM COMPLETE BUBBLE STUDY  Result Date: 03/25/2022    ECHOCARDIOGRAM REPORT   Patient Name:   NORLENE HODGKIN Date of Exam: 03/25/2022 Medical Rec #:  [id number]      Height:       63.0 in Accession #:    [id number]     Weight:       230.0 lb Date of Birth:  26-Sep-1955      BSA:          2.052 m Patient Age:    43 years       BP:           140/71 mmHg Patient Gender: F              HR:           67 bpm. Exam Location:  [facility] Procedure: 2D Echo, Cardiac Doppler, Color Doppler and Saline Contrast Bubble            Study Indications:     Stroke  History:         Patient has prior history of Echocardiogram examinations, most                  recent 06/10/2019. Stroke and TIA; Risk Factors:Hypertension,  Diabetes and Dyslipidemia.  Sonographer:     Wenda Low Referring Phys:  [id number] JAN A MANSY Diagnosing Phys: Ida Rogue MD  Sonographer Comments: Patient is obese. IMPRESSIONS  1. Left ventricular ejection fraction, by estimation, is 55 to 60%. The left ventricle has normal function. The left ventricle has no regional wall motion abnormalities. There is mild to moderate left ventricular hypertrophy. Left ventricular diastolic parameters are consistent with Grade I diastolic dysfunction (impaired relaxation).  2. Right ventricular systolic function is normal. The right ventricular size is normal. There is normal pulmonary artery systolic pressure.  3. Left atrial size was mildly dilated.  4. The mitral valve is normal in structure. Mild mitral valve regurgitation. No evidence of mitral stenosis.  5. The aortic valve is tricuspid. Aortic valve regurgitation is not visualized. Aortic valve sclerosis is present, with no evidence of aortic valve stenosis.  6. The inferior vena cava is normal in size with greater  than 50% respiratory variability, suggesting right atrial pressure of 3 mmHg.  7. Agitated saline contrast bubble study was negative, with no evidence of any interatrial shunt. FINDINGS  Left Ventricle: Left ventricular ejection fraction, by estimation, is 55 to 60%. The left ventricle has normal function. The left ventricle has no regional wall motion abnormalities. The left ventricular internal cavity size was normal in size. There is  mild left ventricular hypertrophy. Left ventricular diastolic parameters are consistent with Grade I diastolic dysfunction (impaired relaxation). Right Ventricle: The right ventricular size is normal. No increase in right ventricular wall thickness. Right ventricular systolic function is normal. There is normal pulmonary artery systolic pressure. The tricuspid regurgitant velocity is 2.04 m/s, and  with an assumed right atrial pressure of 3 mmHg, the estimated right ventricular systolic pressure is [id number] mmHg. Left Atrium: Left atrial size was mildly dilated. Right Atrium: Right atrial size was normal in size. Pericardium: There is no evidence of pericardial effusion. Mitral Valve: The mitral valve is normal in structure. There is mild calcification of the mitral valve leaflet(s). Mild mitral valve regurgitation. No evidence of mitral valve stenosis. MV peak gradient, 4.0 mmHg. The mean mitral valve gradient is 1.0 mmHg. Tricuspid Valve: The tricuspid valve is normal in structure. Tricuspid valve regurgitation is not demonstrated. No evidence of tricuspid stenosis. Aortic Valve: The aortic valve is tricuspid. Aortic valve regurgitation is not visualized. Aortic valve sclerosis is present, with no evidence of aortic valve stenosis. Aortic valve mean gradient measures 4.0 mmHg. Aortic valve peak gradient measures 8.0  mmHg. Aortic valve area, by VTI measures 2.56 cm. Pulmonic Valve: The pulmonic valve was normal in structure. Pulmonic valve regurgitation is not visualized. No evidence  of pulmonic stenosis. Aorta: The aortic root is normal in size and structure. Venous: The inferior vena cava is normal in size with greater than 50% respiratory variability, suggesting right atrial pressure of 3 mmHg. IAS/Shunts: No atrial level shunt detected by color flow Doppler. Agitated saline contrast was given intravenously to evaluate for intracardiac shunting. Agitated saline contrast bubble study was negative, with no evidence of any interatrial shunt. There  is no evidence of a patent foramen ovale. There is no evidence of an atrial septal defect.  LEFT VENTRICLE PLAX 2D LVIDd:         4.20 cm   Diastology LVIDs:         2.60 cm   LV e' medial:    5.00 cm/s LV PW:         1.20 cm   LV  E/e' medial:  10.8 LV IVS:        1.30 cm   LV e' lateral:   7.62 cm/s LVOT diam:     2.10 cm   LV E/e' lateral: 7.1 LV SV:         75 LV SV Index:   37 LVOT Area:     3.46 cm  RIGHT VENTRICLE RV Basal diam:  3.05 cm RV Mid diam:    2.70 cm RV S prime:     10.60 cm/s TAPSE (M-mode): 2.1 cm LEFT ATRIUM             Index        RIGHT ATRIUM           Index LA diam:        4.30 cm 2.10 cm/m   RA Area:     14.50 cm LA Vol (A2C):   56.8 ml 27.68 ml/m  RA Volume:   31.80 ml  15.50 ml/m LA Vol (A4C):   62.1 ml 30.26 ml/m LA Biplane Vol: 60.9 ml 29.68 ml/m  AORTIC VALVE                    PULMONIC VALVE AV Area (Vmax):    2.35 cm     PV Vmax:       0.87 m/s AV Area (Vmean):   2.16 cm     PV Peak grad:  3.0 mmHg AV Area (VTI):     2.56 cm AV Vmax:           141.00 cm/s AV Vmean:          93.800 cm/s AV VTI:            0.294 m AV Peak Grad:      8.0 mmHg AV Mean Grad:      4.0 mmHg LVOT Vmax:         95.80 cm/s LVOT Vmean:        58.500 cm/s LVOT VTI:          0.217 m LVOT/AV VTI ratio: 0.74  AORTA Ao Root diam: 3.10 cm MITRAL VALVE               TRICUSPID VALVE MV Area (PHT): 2.12 cm    TR Peak grad:   16.6 mmHg MV Area VTI:   2.64 cm    TR Vmax:        204.00 cm/s MV Peak grad:  4.0 mmHg MV Mean grad:  1.0 mmHg    SHUNTS  MV Vmax:       1.00 m/s    Systemic VTI:  0.22 m MV Vmean:      49.9 cm/s   Systemic Diam: 2.10 cm MV Decel Time: 357 msec MV E velocity: 54.00 cm/s MV A velocity: 89.00 cm/s MV E/A ratio:  0.61 Ida Rogue MD Electronically signed by Ida Rogue MD Signature Date/Time: 03/25/2022/1:38:18 PM    Final    MR BRAIN WO CONTRAST  Result Date: 03/25/2022 CLINICAL DATA:  Initial evaluation for acute TIA. EXAM: MRI HEAD WITHOUT CONTRAST TECHNIQUE: Multiplanar, multiecho pulse sequences of the brain and surrounding structures were obtained without intravenous contrast. COMPARISON:  Comparison made with prior CT from earlier the same day as well as previous studies. FINDINGS: Brain: Cerebral volume within normal limits. Patchy T2/FLAIR hyperintensity involving the periventricular, deep, and subcortical white matter both cerebral hemispheres, most characteristic of chronic microvascular ischemic disease. Few remote lacunar infarcts present about the  left greater than right basal ganglia. Chronic bilateral PCA territory infarcts involving the occipital lobes noted. No evidence for acute or subacute ischemia. Gray-white matter differentiation otherwise maintained. No acute intracranial hemorrhage. Few punctate chronic micro hemorrhages noted, likely hypertensive/small vessel related. No mass lesion, midline shift or mass effect. No hydrocephalus or extra-axial fluid collection. Pituitary gland and suprasellar region within normal limits. Vascular: Major intracranial vascular flow voids are maintained. Skull and upper cervical spine: Craniocervical junction within normal limits. Bone marrow signal intensity within normal limits for no scalp soft tissue abnormality. Sinuses/Orbits: Globes and orbital soft tissues within normal limits. Paranasal sinuses are largely clear. No significant mastoid effusion. Other: None. IMPRESSION: 1. No acute intracranial abnormality. 2. 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 3. Underlying chronic microvascular ischemic disease with a few remote lacunar infarcts about the left greater than right basal ganglia. Electronically Signed   By: Jeannine Boga M.D.   On: 03/25/2022 02:52   CT HEAD WO CONTRAST  Result Date: 03/25/2022 CLINICAL DATA:  Aphasia and vision blurring on the left side, resolving. EXAM: CT HEAD WITHOUT CONTRAST TECHNIQUE: Contiguous axial images were obtained from the base of the skull through the vertex without intravenous contrast. RADIATION DOSE REDUCTION: This exam was performed according to the departmental dose-optimization program which includes automated exposure control, adjustment of the mA and/or kV according to patient size and/or use of iterative reconstruction technique. COMPARISON:  CT 08/27/2019.  MRI 08/25/2019 FINDINGS: Brain: Encephalomalacia in the posterior occipital lobes corresponding to previous infarcts seen on prior studies. No mass-effect or midline shift. No abnormal extra-axial fluid collections. Ventricles are not dilated. Basal cisterns are not effaced. No acute intracranial hemorrhage. Vascular: No hyperdense vessel or unexpected calcification. Skull: Calvarium appears intact. Sinuses/Orbits: Paranasal sinuses and mastoid air cells are clear. Other: None. IMPRESSION: No acute intracranial abnormalities. Old bilateral occipital infarcts similar to prior study. Electronically Signed   By: Lucienne Capers M.D.   On: 03/25/2022 01:13    Microbiology: Results for orders placed or performed during the hospital encounter of 01/13/22  Urine Culture     Status: Abnormal   Collection Time: 01/13/22  7:31 PM   Specimen: Urine, Clean Catch  Result Value Ref Range Status   Specimen Description URINE, CLEAN CATCH  Final   Special Requests   Final    NONE Performed at [facility], [place] [address] [place], [place] [address]    Culture (A)  Final    >=100,000 COLONIES/mL ESCHERICHIA COLI Confirmed Extended  Spectrum Beta-Lactamase Producer (ESBL).  In bloodstream infections from ESBL organisms, carbapenems are preferred over piperacillin/tazobactam. They are shown to have a lower risk of mortality.    Report Status 01/15/2022 FINAL  Final   Organism ID, Bacteria ESCHERICHIA COLI (A)  Final      Susceptibility   Escherichia coli - MIC*    AMPICILLIN >=32 RESISTANT Resistant     CEFAZOLIN >=64 RESISTANT Resistant     CEFEPIME 4 INTERMEDIATE Intermediate     CEFTRIAXONE >=64 RESISTANT Resistant     CIPROFLOXACIN <=0.25 SENSITIVE Sensitive     GENTAMICIN <=1 SENSITIVE Sensitive     IMIPENEM <=0.25 SENSITIVE Sensitive     NITROFURANTOIN <=16 SENSITIVE Sensitive     TRIMETH/SULFA <=20 SENSITIVE Sensitive     AMPICILLIN/SULBACTAM 16 INTERMEDIATE Intermediate     PIP/TAZO <=4 SENSITIVE Sensitive     * >=100,000 COLONIES/mL ESCHERICHIA COLI    Labs: CBC: Recent Labs  Lab 03/25/22 0033 03/25/22  0423  WBC 7.2 6.2  NEUTROABS 4.1  --   HGB 13.9 12.4  HCT 42.5 38.1  MCV 91.0 91.1  PLT 267 [id number]   Basic Metabolic Panel: Recent Labs  Lab 03/25/22 0033 03/25/22 0423  NA 137 136  K 4.0 3.8  CL 103 104  CO2 25 24  GLUCOSE 175* 202*  BUN 18 17  CREATININE 1.12* 1.04*  CALCIUM 8.8* 8.3*   Liver Function Tests: Recent Labs  Lab 03/25/22 0033  AST 26  ALT 26  ALKPHOS 75  BILITOT 0.7  PROT 7.6  ALBUMIN 4.0   CBG: Recent Labs  Lab 03/25/22 1215 03/25/22 1728 03/25/22 2113 03/26/22 0853 03/26/22 1200  GLUCAP 156* 119* 129* 113* 187*    Discharge time spent: {LESS THAN/GREATER THAN:26388} 30 minutes.  Signed: Ezekiel Slocumb, DO [facility] 03/26/2022

## 2022-03-26 NOTE — Plan of Care (Signed)
  Problem: Education: Goal: Knowledge of disease or condition will improve Outcome: Progressing Goal: Knowledge of secondary prevention will improve (MUST DOCUMENT ALL) Outcome: Progressing Goal: Knowledge of patient specific risk factors will improve (Mark N/A or DELETE if not current risk factor) Outcome: Progressing   Problem: Ischemic Stroke/TIA Tissue Perfusion: Goal: Complications of ischemic stroke/TIA will be minimized Outcome: Progressing   

## 2022-03-26 NOTE — Progress Notes (Signed)
Subjective: Ambulating well per PT.   Objective: Current vital signs: BP (!) 163/75 (BP Location: Right Arm)   Pulse 65   Temp 97.8 F (36.6 C)   Resp 17   Ht 5' 3"$  (1.6 m)   Wt 104.3 kg   SpO2 100%   BMI 40.74 kg/m  Vital signs in last 24 hours: Temp:  [97.6 F (36.4 C)-98.6 F (37 C)] 97.8 F (36.6 C) (02/09 0851) Pulse Rate:  [65-72] 65 (02/09 0851) Resp:  [16-20] 17 (02/09 0851) BP: (133-163)/(65-77) 163/75 (02/09 0851) SpO2:  [97 %-100 %] 100 % (02/09 0851)  Intake/Output from previous day: No intake/output data recorded. Intake/Output this shift: No intake/output data recorded. Nutritional status:  Diet Order             Diet Carb Modified Fluid consistency: Thin; Room service appropriate? Yes  Diet effective now                  Physical Exam HEENT-  [place]/AT   Lungs- Respirations unlabored Extremities- Warm and well-perfused   Neurological Examination Mental Status: Awake, alert and oriented. Thought content appropriate.  Affect euthymic. Speech fluent with intact naming and comprehension.  Able to follow all commands without difficulty. No dysarthria noted.  Cranial Nerves: II: Unchanged since yesterday.  III,IV, VI: No ptosis. EOMI. No nystagmus. VII: Smile symmetric VIII: Hearing intact to voice IX,X: No hypophonia or hoarseness XI: Symmetric XII: No lingual dysarthria Motor: RUE: 4+/5 RLE: 4/5 LUE: 5/5 LLE: 5/5 Cerebellar: No ataxia with FNF bilaterally. Subtle action tremor.    Lab Results: Results for orders placed or performed during the hospital encounter of 03/25/22 (from the past 48 hour(s))  CBG monitoring, ED     Status: Abnormal   Collection Time: 03/25/22 12:33 AM  Result Value Ref Range   Glucose-Capillary 159 (H) 70 - 99 mg/dL    Comment: Glucose reference range applies only to samples taken after fasting for at least 8 hours.  Ethanol     Status: None   Collection Time: 03/25/22 12:33 AM  Result Value Ref Range   Alcohol,  Ethyl (B) <10 <10 mg/dL    Comment: (NOTE) Lowest detectable limit for serum alcohol is 10 mg/dL.  For medical purposes only. Performed at [facility], [address] [place], [place] [address]   Protime-INR     Status: None   Collection Time: 03/25/22 12:33 AM  Result Value Ref Range   Prothrombin Time 12.2 11.4 - 15.2 seconds   INR 0.9 0.8 - 1.2    Comment: (NOTE) INR goal varies based on device and disease states. Performed at [facility], [address] [place], [place] [address]   APTT     Status: None   Collection Time: 03/25/22 12:33 AM  Result Value Ref Range   aPTT 28 24 - 36 seconds    Comment: Performed at [facility], [address] [place], [place] [address]  CBC     Status: None   Collection Time: 03/25/22 12:33 AM  Result Value Ref Range   WBC 7.2 4.0 - 10.5 K/uL   RBC 4.67 3.87 - 5.11 MIL/uL   Hemoglobin 13.9 12.0 - 15.0 g/dL   HCT 42.5 36.0 - 46.0 %   MCV 91.0 80.0 - 100.0 fL   MCH 29.8 26.0 - 34.0 pg   MCHC 32.7 30.0 - 36.0 g/dL   RDW 12.8 11.5 - 15.5 %   Platelets 267 150 - 400 K/uL  nRBC 0.0 0.0 - 0.2 %    Comment: Performed at [facility], [address] [place], [place] [address]  Differential     Status: None   Collection Time: 03/25/22 12:33 AM  Result Value Ref Range   Neutrophils Relative % 58 %   Neutro Abs 4.1 1.7 - 7.7 K/uL   Lymphocytes Relative 32 %   Lymphs Abs 2.3 0.7 - 4.0 K/uL   Monocytes Relative 8 %   Monocytes Absolute 0.6 0.1 - 1.0 K/uL   Eosinophils Relative 2 %   Eosinophils Absolute 0.1 0.0 - 0.5 K/uL   Basophils Relative 0 %   Basophils Absolute 0.0 0.0 - 0.1 K/uL   Immature Granulocytes 0 %   Abs Immature Granulocytes 0.02 0.00 - 0.07 K/uL    Comment: Performed at [facility], [address] [place], [place] [address]  Comprehensive metabolic panel     Status: Abnormal   Collection Time: 03/25/22 12:33 AM  Result Value Ref Range   Sodium 137 135 - 145 mmol/L    Potassium 4.0 3.5 - 5.1 mmol/L   Chloride 103 98 - 111 mmol/L   CO2 25 22 - 32 mmol/L   Glucose, Bld 175 (H) 70 - 99 mg/dL    Comment: Glucose reference range applies only to samples taken after fasting for at least 8 hours.   BUN 18 8 - 23 mg/dL   Creatinine, Ser 1.12 (H) 0.44 - 1.00 mg/dL   Calcium 8.8 (L) 8.9 - 10.3 mg/dL   Total Protein 7.6 6.5 - 8.1 g/dL   Albumin 4.0 3.5 - 5.0 g/dL   AST 26 15 - 41 U/L   ALT 26 0 - 44 U/L   Alkaline Phosphatase 75 38 - 126 U/L   Total Bilirubin 0.7 0.3 - 1.2 mg/dL   GFR, Estimated 55 (L) >60 mL/min    Comment: (NOTE) Calculated using the CKD-EPI Creatinine Equation (2021)    Anion gap 9 5 - 15    Comment: Performed at [facility], [address] [place], [place] [address]  Urine Drug Screen, Qualitative     Status: None   Collection Time: 03/25/22 12:33 AM  Result Value Ref Range   Tricyclic, Ur Screen NONE DETECTED NONE DETECTED   Amphetamines, Ur Screen NONE DETECTED NONE DETECTED   MDMA (Ecstasy)Ur Screen NONE DETECTED NONE DETECTED   Cocaine Metabolite,Ur [place] NONE DETECTED NONE DETECTED   Opiate, Ur Screen NONE DETECTED NONE DETECTED   Phencyclidine (PCP) Ur S NONE DETECTED NONE DETECTED   Cannabinoid 50 Ng, Ur [place] NONE DETECTED NONE DETECTED   Barbiturates, Ur Screen NONE DETECTED NONE DETECTED   Benzodiazepine, Ur Scrn NONE DETECTED NONE DETECTED   Methadone Scn, Ur NONE DETECTED NONE DETECTED    Comment: (NOTE) Tricyclics + metabolites, urine    Cutoff 1000 ng/mL Amphetamines + metabolites, urine  Cutoff 1000 ng/mL MDMA (Ecstasy), urine              Cutoff 500 ng/mL Cocaine Metabolite, urine          Cutoff 300 ng/mL Opiate + metabolites, urine        Cutoff 300 ng/mL Phencyclidine (PCP), urine         Cutoff 25 ng/mL Cannabinoid, urine                 Cutoff 50 ng/mL Barbiturates + metabolites, urine  Cutoff 200 ng/mL Benzodiazepine, urine  Cutoff 200 ng/mL Methadone, urine                   Cutoff 300  ng/mL  The urine drug screen provides only a preliminary, unconfirmed analytical test result and should not be used for non-medical purposes. Clinical consideration and professional judgment should be applied to any positive drug screen result due to possible interfering substances. A more specific alternate chemical method must be used in order to obtain a confirmed analytical result. Gas chromatography / mass spectrometry (GC/MS) is the preferred confirm atory method. Performed at [facility], [address] [place], [place] [address]   Urinalysis, Routine w reflex microscopic -Urine, Clean Catch     Status: Abnormal   Collection Time: 03/25/22 12:33 AM  Result Value Ref Range   Color, Urine COLORLESS (A) YELLOW   APPearance CLEAR (A) CLEAR   Specific Gravity, Urine 1.004 (L) 1.005 - 1.030   pH 8.0 5.0 - 8.0   Glucose, UA 50 (A) NEGATIVE mg/dL   Hgb urine dipstick NEGATIVE NEGATIVE   Bilirubin Urine NEGATIVE NEGATIVE   Ketones, ur NEGATIVE NEGATIVE mg/dL   Protein, ur 100 (A) NEGATIVE mg/dL   Nitrite NEGATIVE NEGATIVE   Leukocytes,Ua NEGATIVE NEGATIVE   RBC / HPF 0-5 0 - 5 RBC/hpf   WBC, UA 0-5 0 - 5 WBC/hpf   Bacteria, UA RARE (A) NONE SEEN   Squamous Epithelial / HPF 0-5 0 - 5 /HPF    Comment: Performed at [facility], [address] [place], [place] [address]  Glucose, capillary     Status: Abnormal   Collection Time: 03/25/22  4:21 AM  Result Value Ref Range   Glucose-Capillary 199 (H) 70 - 99 mg/dL    Comment: Glucose reference range applies only to samples taken after fasting for at least 8 hours.  Lipid panel     Status: Abnormal   Collection Time: 03/25/22  4:23 AM  Result Value Ref Range   Cholesterol 220 (H) 0 - 200 mg/dL   Triglycerides 112 <150 mg/dL   HDL 37 (L) >40 mg/dL   Total CHOL/HDL Ratio 5.9 RATIO   VLDL 22 0 - 40 mg/dL   LDL Cholesterol 161 (H) 0 - 99 mg/dL    Comment:        Total Cholesterol/HDL:CHD Risk Coronary Heart  Disease Risk Table                     Men   Women  1/2 Average Risk   3.4   3.3  Average Risk       5.0   4.4  2 X Average Risk   9.6   7.1  3 X Average Risk  23.4   11.0        Use the calculated Patient Ratio above and the CHD Risk Table to determine the patient's CHD Risk.        ATP III CLASSIFICATION (LDL):  <100     mg/dL   Optimal  100-129  mg/dL   Near or Above                    Optimal  130-159  mg/dL   Borderline  160-189  mg/dL   High  >190     mg/dL   Very High Performed at [facility], [place], [place] [address]   HIV Antibody (routine testing w rflx)     Status: None  Collection Time: 03/25/22  4:23 AM  Result Value Ref Range   HIV Screen 4th Generation wRfx Non Reactive Non Reactive    Comment: Performed at [facility], [place] [address], [place], [place] [id number]  Basic metabolic panel     Status: Abnormal   Collection Time: 03/25/22  4:23 AM  Result Value Ref Range   Sodium 136 135 - 145 mmol/L   Potassium 3.8 3.5 - 5.1 mmol/L   Chloride 104 98 - 111 mmol/L   CO2 24 22 - 32 mmol/L   Glucose, Bld 202 (H) 70 - 99 mg/dL    Comment: Glucose reference range applies only to samples taken after fasting for at least 8 hours.   BUN 17 8 - 23 mg/dL   Creatinine, Ser 1.04 (H) 0.44 - 1.00 mg/dL   Calcium 8.3 (L) 8.9 - 10.3 mg/dL   GFR, Estimated 60 (L) >60 mL/min    Comment: (NOTE) Calculated using the CKD-EPI Creatinine Equation (2021)    Anion gap 8 5 - 15    Comment: Performed at [facility], [address] [place], [place] [address]  CBC     Status: None   Collection Time: 03/25/22  4:23 AM  Result Value Ref Range   WBC 6.2 4.0 - 10.5 K/uL   RBC 4.18 3.87 - 5.11 MIL/uL   Hemoglobin 12.4 12.0 - 15.0 g/dL   HCT 38.1 36.0 - 46.0 %   MCV 91.1 80.0 - 100.0 fL   MCH 29.7 26.0 - 34.0 pg   MCHC 32.5 30.0 - 36.0 g/dL   RDW 12.7 11.5 - 15.5 %   Platelets 241 150 - 400 K/uL   nRBC 0.0 0.0 - 0.2 %    Comment:  Performed at [facility], [address] [place], [place] [address]  Glucose, capillary     Status: Abnormal   Collection Time: 03/25/22  7:47 AM  Result Value Ref Range   Glucose-Capillary 214 (H) 70 - 99 mg/dL    Comment: Glucose reference range applies only to samples taken after fasting for at least 8 hours.  Glucose, capillary     Status: Abnormal   Collection Time: 03/25/22 12:15 PM  Result Value Ref Range   Glucose-Capillary 156 (H) 70 - 99 mg/dL    Comment: Glucose reference range applies only to samples taken after fasting for at least 8 hours.  Vitamin B12     Status: None   Collection Time: 03/25/22  1:47 PM  Result Value Ref Range   Vitamin B-12 186 180 - 914 pg/mL    Comment: (NOTE) This assay is not validated for testing neonatal or myeloproliferative syndrome specimens for Vitamin B12 levels. Performed at [facility], [place] [address], [place], [place] [address]   Glucose, capillary     Status: Abnormal   Collection Time: 03/25/22  5:28 PM  Result Value Ref Range   Glucose-Capillary 119 (H) 70 - 99 mg/dL    Comment: Glucose reference range applies only to samples taken after fasting for at least 8 hours.  Glucose, capillary     Status: Abnormal   Collection Time: 03/25/22  9:13 PM  Result Value Ref Range   Glucose-Capillary 129 (H) 70 - 99 mg/dL    Comment: Glucose reference range applies only to samples taken after fasting for at least 8 hours.  Glucose, capillary     Status: Abnormal   Collection Time: 03/26/22  8:53 AM  Result Value Ref Range  Glucose-Capillary 113 (H) 70 - 99 mg/dL    Comment: Glucose reference range applies only to samples taken after fasting for at least 8 hours.    No results found for this or any previous visit (from the past 240 hour(s)).  Lipid Panel Recent Labs    03/25/22 0423  CHOL 220*  TRIG 112  HDL 37*  CHOLHDL 5.9  VLDL 22  LDLCALC 161*    Studies/Results: CT ANGIO HEAD NECK W WO CM  Result Date:  03/26/2022 CLINICAL DATA:  Neuro deficit, acute, stroke suspected. EXAM: CT ANGIOGRAPHY HEAD AND NECK TECHNIQUE: Multidetector CT imaging of the head and neck was performed using the standard protocol during bolus administration of intravenous contrast. Multiplanar CT image reconstructions and MIPs were obtained to evaluate the vascular anatomy. Carotid stenosis measurements (when applicable) are obtained utilizing NASCET criteria, using the distal internal carotid diameter as the denominator. RADIATION DOSE REDUCTION: This exam was performed according to the departmental dose-optimization program which includes automated exposure control, adjustment of the mA and/or kV according to patient size and/or use of iterative reconstruction technique. CONTRAST:  69m OMNIPAQUE IOHEXOL 350 MG/ML SOLN COMPARISON:  CT MRI brain March 25, 2022. CT angiogram August 25, 2018. FINDINGS: CT HEAD FINDINGS Brain: No evidence of acute infarction, hemorrhage, hydrocephalus, extra-axial collection or mass lesion/mass effect. 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 Vascular: Calcified plaques in the bilateral carotid siphons. Skull: Normal. Negative for fracture or focal lesion. Sinuses/Orbits: No acute finding. Other: None. Review of the MIP images confirms the above findings CTA NECK FINDINGS Aortic arch: Standard branching. Imaged portion shows no evidence of aneurysm or dissection. Mild atherosclerotic changes in the visualized portion of the aortic arch and at the origin of the major arch vessels without hemodynamically significant stenosis. Right carotid system: Mild atherosclerotic changes at the right carotid bifurcation without stenosis. Retropharyngeal course of the mid cervical right ICA. Left carotid system: Mild atherosclerotic changes at the left carotid bifurcation without stenosis. Retropharyngeal course of the mid cervical left ICA. Vertebral arteries: Normal course and caliber of the V1 and V2  segments of the dominant left vertebral artery with approximately 60% stenosis of the V3 segment at the dural margin. Diminutive right vertebral artery which appear occluded at the origin from the right subclavian artery with intermittent opacification along the neck, likely from collateral circulation from muscular branches. This is likely a combination of hypoplastic vertebral artery with superimposed atherosclerosis versus remote dissection. This appears similar to prior CTA. Skeleton: A 5 mm well-defined lytic lesion the right side of the C3 vertebral body, unchanged when compared to prior CT performed in 2021, likely benign. Other neck: Small cystic appearing lesion in the right submandibular gland. Upper chest: Negative. Review of the MIP images confirms the above findings CTA HEAD FINDINGS Anterior circulation: Calcified atherosclerotic changes of the bilateral internal carotid arteries with approximately is 55% stenosis of the right ICA at the horizontal cavernous segment and 55% stenosis of the left ICA at the paraclinoid segment. Diffuse luminal irregularity along the bilateral ACA and MCA vascular trees with multifocal areas of mild stenosis. Short segment of moderate stenosis is seen at the origin of a left M2/MCA inferior division branch. Severe stenosis at the origin of the right A1/ACA. Posterior circulation: The hypoplastic right vertebral artery functionally ends in PICA with the distal V4 segment showing minimal opacification with occlusion near the vertebrobasilar junction. Atherosclerotic changes of the intracranial left vertebral artery with approximately 55% stenosis distal to the left  PICA takeoff. Diffuse luminal irregularity of the basilar artery consistent with atherosclerotic disease with diffusely small caliber. There is approximately 40% stenosis at the mid basilar segment. Diffuse luminal irregularity of the bilateral posterior cerebral arteries which have small caliber and multifocal  areas of stenosis, severe at the left P2 segment and at the right proximal P1 and P1-P2 junction. Venous sinuses: Patent. Anatomic variants: None significant. Review of the MIP images confirms the above findings IMPRESSION: 1. No acute intracranial abnormality. 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 2. No intracranial large vessel occlusion. 3. Intracranial atherosclerotic disease with multifocal areas of stenosis involving the anterior and posterior circulation. Severe stenosis at the origin of the right A1/ACA. Severe stenosis at the left P2 segment and right proximal P1 and P1-P2 junction. 4. Atherosclerotic changes of the bilateral carotid bifurcations without stenosis. 5. Diminutive right vertebral artery which appear occluded at the origin from the right subclavian artery with intermittent opacification along the neck, likely from collateral circulation from muscular branches. This is likely a combination of hypoplastic vertebral artery with superimposed atherosclerosis versus remote dissection. This appears similar to prior CTA. 6. A 5 mm well-defined lytic lesion the right side of the C3 vertebral body, unchanged when compared to prior CT performed in 2021, likely benign. Aortic Atherosclerosis (ICD10-I70.0). Electronically Signed   By: Pedro Earls M.D.   On: 03/26/2022 10:56   ECHOCARDIOGRAM COMPLETE BUBBLE STUDY  Result Date: 03/25/2022    ECHOCARDIOGRAM REPORT   Patient Name:   Tami Jones Date of Exam: 03/25/2022 Medical Rec #:  [id number]      Height:       63.0 in Accession #:    [id number]     Weight:       230.0 lb Date of Birth:  July 30, 1955      BSA:          2.052 m Patient Age:    67 years       BP:           140/71 mmHg Patient Gender: F              HR:           67 bpm. Exam Location:  [facility] Procedure: 2D Echo, Cardiac Doppler, Color Doppler and Saline Contrast Bubble            Study Indications:     Stroke  History:         Patient has prior history  of Echocardiogram examinations, most                  recent 06/10/2019. Stroke and TIA; Risk Factors:Hypertension,                  Diabetes and Dyslipidemia.  Sonographer:     Wenda Low Referring Phys:  [id number] JAN A MANSY Diagnosing Phys: Ida Rogue MD  Sonographer Comments: Patient is obese. IMPRESSIONS  1. Left ventricular ejection fraction, by estimation, is 55 to 60%. The left ventricle has normal function. The left ventricle has no regional wall motion abnormalities. There is mild to moderate left ventricular hypertrophy. Left ventricular diastolic parameters are consistent with Grade I diastolic dysfunction (impaired relaxation).  2. Right ventricular systolic function is normal. The right ventricular size is normal. There is normal pulmonary artery systolic pressure.  3. Left atrial size was mildly dilated.  4. The mitral valve is normal in structure. Mild mitral valve regurgitation. No evidence of mitral  stenosis.  5. The aortic valve is tricuspid. Aortic valve regurgitation is not visualized. Aortic valve sclerosis is present, with no evidence of aortic valve stenosis.  6. The inferior vena cava is normal in size with greater than 50% respiratory variability, suggesting right atrial pressure of 3 mmHg.  7. Agitated saline contrast bubble study was negative, with no evidence of any interatrial shunt. FINDINGS  Left Ventricle: Left ventricular ejection fraction, by estimation, is 55 to 60%. The left ventricle has normal function. The left ventricle has no regional wall motion abnormalities. The left ventricular internal cavity size was normal in size. There is  mild left ventricular hypertrophy. Left ventricular diastolic parameters are consistent with Grade I diastolic dysfunction (impaired relaxation). Right Ventricle: The right ventricular size is normal. No increase in right ventricular wall thickness. Right ventricular systolic function is normal. There is normal pulmonary artery systolic  pressure. The tricuspid regurgitant velocity is 2.04 m/s, and  with an assumed right atrial pressure of 3 mmHg, the estimated right ventricular systolic pressure is [id number] mmHg. Left Atrium: Left atrial size was mildly dilated. Right Atrium: Right atrial size was normal in size. Pericardium: There is no evidence of pericardial effusion. Mitral Valve: The mitral valve is normal in structure. There is mild calcification of the mitral valve leaflet(s). Mild mitral valve regurgitation. No evidence of mitral valve stenosis. MV peak gradient, 4.0 mmHg. The mean mitral valve gradient is 1.0 mmHg. Tricuspid Valve: The tricuspid valve is normal in structure. Tricuspid valve regurgitation is not demonstrated. No evidence of tricuspid stenosis. Aortic Valve: The aortic valve is tricuspid. Aortic valve regurgitation is not visualized. Aortic valve sclerosis is present, with no evidence of aortic valve stenosis. Aortic valve mean gradient measures 4.0 mmHg. Aortic valve peak gradient measures 8.0  mmHg. Aortic valve area, by VTI measures 2.56 cm. Pulmonic Valve: The pulmonic valve was normal in structure. Pulmonic valve regurgitation is not visualized. No evidence of pulmonic stenosis. Aorta: The aortic root is normal in size and structure. Venous: The inferior vena cava is normal in size with greater than 50% respiratory variability, suggesting right atrial pressure of 3 mmHg. IAS/Shunts: No atrial level shunt detected by color flow Doppler. Agitated saline contrast was given intravenously to evaluate for intracardiac shunting. Agitated saline contrast bubble study was negative, with no evidence of any interatrial shunt. There  is no evidence of a patent foramen ovale. There is no evidence of an atrial septal defect.  LEFT VENTRICLE PLAX 2D LVIDd:         4.20 cm   Diastology LVIDs:         2.60 cm   LV e' medial:    5.00 cm/s LV PW:         1.20 cm   LV E/e' medial:  10.8 LV IVS:        1.30 cm   LV e' lateral:   7.62 cm/s LVOT  diam:     2.10 cm   LV E/e' lateral: 7.1 LV SV:         75 LV SV Index:   37 LVOT Area:     3.46 cm  RIGHT VENTRICLE RV Basal diam:  3.05 cm RV Mid diam:    2.70 cm RV S prime:     10.60 cm/s TAPSE (M-mode): 2.1 cm LEFT ATRIUM             Index        RIGHT ATRIUM  Index LA diam:        4.30 cm 2.10 cm/m   RA Area:     14.50 cm LA Vol (A2C):   56.8 ml 27.68 ml/m  RA Volume:   31.80 ml  15.50 ml/m LA Vol (A4C):   62.1 ml 30.26 ml/m LA Biplane Vol: 60.9 ml 29.68 ml/m  AORTIC VALVE                    PULMONIC VALVE AV Area (Vmax):    2.35 cm     PV Vmax:       0.87 m/s AV Area (Vmean):   2.16 cm     PV Peak grad:  3.0 mmHg AV Area (VTI):     2.56 cm AV Vmax:           141.00 cm/s AV Vmean:          93.800 cm/s AV VTI:            0.294 m AV Peak Grad:      8.0 mmHg AV Mean Grad:      4.0 mmHg LVOT Vmax:         95.80 cm/s LVOT Vmean:        58.500 cm/s LVOT VTI:          0.217 m LVOT/AV VTI ratio: 0.74  AORTA Ao Root diam: 3.10 cm MITRAL VALVE               TRICUSPID VALVE MV Area (PHT): 2.12 cm    TR Peak grad:   16.6 mmHg MV Area VTI:   2.64 cm    TR Vmax:        204.00 cm/s MV Peak grad:  4.0 mmHg MV Mean grad:  1.0 mmHg    SHUNTS MV Vmax:       1.00 m/s    Systemic VTI:  0.22 m MV Vmean:      49.9 cm/s   Systemic Diam: 2.10 cm MV Decel Time: 357 msec MV E velocity: 54.00 cm/s MV A velocity: 89.00 cm/s MV E/A ratio:  0.61 Ida Rogue MD Electronically signed by Ida Rogue MD Signature Date/Time: 03/25/2022/1:38:18 PM    Final    MR BRAIN WO CONTRAST  Result Date: 03/25/2022 CLINICAL DATA:  Initial evaluation for acute TIA. EXAM: MRI HEAD WITHOUT CONTRAST TECHNIQUE: Multiplanar, multiecho pulse sequences of the brain and surrounding structures were obtained without intravenous contrast. COMPARISON:  Comparison made with prior CT from earlier the same day as well as previous studies. FINDINGS: Brain: Cerebral volume within normal limits. Patchy T2/FLAIR hyperintensity involving the  periventricular, deep, and subcortical white matter both cerebral hemispheres, most characteristic of chronic microvascular ischemic disease. Few remote lacunar infarcts present about the left greater than right basal ganglia. Chronic bilateral PCA territory infarcts involving the occipital lobes noted. No evidence for acute or subacute ischemia. Gray-white matter differentiation otherwise maintained. No acute intracranial hemorrhage. Few punctate chronic micro hemorrhages noted, likely hypertensive/small vessel related. No mass lesion, midline shift or mass effect. No hydrocephalus or extra-axial fluid collection. Pituitary gland and suprasellar region within normal limits. Vascular: Major intracranial vascular flow voids are maintained. Skull and upper cervical spine: Craniocervical junction within normal limits. Bone marrow signal intensity within normal limits for no scalp soft tissue abnormality. Sinuses/Orbits: Globes and orbital soft tissues within normal limits. Paranasal sinuses are largely clear. No significant mastoid effusion. Other: None. IMPRESSION: 1. No acute intracranial abnormality. 2. Chronic bilateral PCA territory infarcts  involving both occipital lobes. 3. Underlying chronic microvascular ischemic disease with a few remote lacunar infarcts about the left greater than right basal ganglia. Electronically Signed   By: Jeannine Boga M.D.   On: 03/25/2022 02:52   CT HEAD WO CONTRAST  Result Date: 03/25/2022 CLINICAL DATA:  Aphasia and vision blurring on the left side, resolving. EXAM: CT HEAD WITHOUT CONTRAST TECHNIQUE: Contiguous axial images were obtained from the base of the skull through the vertex without intravenous contrast. RADIATION DOSE REDUCTION: This exam was performed according to the departmental dose-optimization program which includes automated exposure control, adjustment of the mA and/or kV according to patient size and/or use of iterative reconstruction technique.  COMPARISON:  CT 08/27/2019.  MRI 08/25/2019 FINDINGS: Brain: Encephalomalacia in the posterior occipital lobes corresponding to previous infarcts seen on prior studies. No mass-effect or midline shift. No abnormal extra-axial fluid collections. Ventricles are not dilated. Basal cisterns are not effaced. No acute intracranial hemorrhage. Vascular: No hyperdense vessel or unexpected calcification. Skull: Calvarium appears intact. Sinuses/Orbits: Paranasal sinuses and mastoid air cells are clear. Other: None. IMPRESSION: No acute intracranial abnormalities. Old bilateral occipital infarcts similar to prior study. Electronically Signed   By: Lucienne Capers M.D.   On: 03/25/2022 01:13    Medications: Scheduled:  amLODipine  10 mg Oral QPM   aspirin EC  81 mg Oral QPM   atorvastatin  80 mg Oral QPM   clopidogrel  75 mg Oral Daily   enoxaparin (LOVENOX) injection  0.5 mg/kg Subcutaneous Q24H   glipiZIDE  5 mg Oral BID   insulin aspart  0-15 Units Subcutaneous TID WC   metoprolol succinate  25 mg Oral Daily   pantoprazole  40 mg Oral Daily    Assessment: 67 year old female with a prior history of occipital lobe strokes presenting with waxing/waning expressive dysphasia in conjunction with severely elevated BP of 221/103 and tachycardia. The patient's home medications include ASA, Plavix and atorvastatin.  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 Aphasia is now resolved.  - Imaging studies:  - TTE: LVEF 55 to 60%. The left ventricle has normal function. The left ventricle has no regional  wall motion abnormalities. There is mild to moderate left ventricular hypertrophy. Left ventricular diastolic  parameters are consistent with Grade I diastolic dysfunction (impaired relaxation). Left atrial size was mildly  dilated. The mitral valve is normal in structure. Aortic valve sclerosis is present, with no evidence of aortic  valve stenosis. No evidence of any interatrial shunt.  - CT head: Old bilateral occipital infarcts similar to prior study. - MRI brain: No acute intracranial abnormality. Chronic bilateral PCA territory infarcts involving both occipital lobes. Underlying chronic microvascular ischemic disease with a few remote lacunar infarcts about the left greater than right basal ganglia.  - CTA of head and neck: No intracranial large vessel occlusion. Intracranial atherosclerotic disease with multifocal areas of stenosis involving the anterior and posterior circulation. Severe stenosis at the origin of the right A1/ACA. Severe stenosis at the left P2 segment and right proximal P1 and P1-P2 junction. Atherosclerotic changes of the bilateral carotid bifurcations without stenosis. 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 This appears similar to prior CTA.  - Findings on exam are referable to the chronic bilateral occipital lobe infarcts seen on MRI, as well as a chronic lacunar infarction in the white matter tracts just dorsal to the left internal capsule.  - DDx for transient aphasia:  - There was some concern for hypertensive encephalopathy while in the ED, but speech symptoms transiently worsened with lowering of BP. Her speech subsequently returned back to baseline.  - Also possible is left MCA territory TIA   Recommendations: - Continue patient on maximal medical therapy for stroke prevention: ASA, Plavix and atorvastatin.  - The patient can be discharged home from a Neurology standpoint.  - Will need outpatient  Neurology follow up.  - Neurohospitalist service will sign off. Please call if there are additional questions.    LOS: 0 days   @Electronically$  signed: Dr. Kerney Elbe 03/26/2022  11:49 AM

## 2022-03-26 NOTE — Progress Notes (Signed)
Physical Therapy Treatment Patient Details Name: Tami Jones MRN: [id number] DOB: 26-Oct-1955 Today's Date: 03/26/2022   History of Present Illness Pt is a 67 year old female presenting to the ED with acute onset of expressive dysphasia  with word finding difficulty; MRI showed  No acute intracranial abnormality, Chronic bilateral PCA territory infarcts involving both occipital Lobe, Underlying chronic microvascular ischemic disease with a few remote lacunar infarcts about the left greater than right basal Ganglia; PMH significant for asthma, type 2 diabetes mellitus, CVA with residual right-sided weakness, GERD, hypertension and dyslipidemia as well as anxiety    PT Comments    Pt is making good progress towards goals with ability to ambulate room distances using tripod cane although does require close supervision and anticipate improved balance with RW. Pt also ambulated to [facility] to void. Left in recliner to order lunch. Pt hopeful for discharge this date.   Recommendations for follow up therapy are one component of a multi-disciplinary discharge planning process, led by the attending physician.  Recommendations may be updated based on patient status, additional functional criteria and insurance authorization.  Follow Up Recommendations  Home health PT     Assistance Recommended at Discharge Intermittent Supervision/Assistance  Patient can return home with the following A little help with walking and/or transfers;Assistance with cooking/housework;A little help with bathing/dressing/bathroom;Assist for transportation   Equipment Recommendations  None recommended by PT    Recommendations for Other Services       Precautions / Restrictions Precautions Precautions: Fall Restrictions Weight Bearing Restrictions: No     Mobility  Bed Mobility Overal bed mobility: Needs Assistance Bed Mobility: Supine to Sit     Supine to sit: Supervision, HOB elevated     General bed mobility  comments: safe technique with ease of effort. HOB elevated    Transfers Overall transfer level: Needs assistance Equipment used:  (tripod cane) Transfers: Sit to/from Stand Sit to Stand: Min guard           General transfer comment: slightly unsteady however able to rise from low surface. Cues for sequencing.    Ambulation/Gait Ambulation/Gait assistance: Supervision Gait Distance (Feet): 20 Feet Assistive device:  (tripod cane) Gait Pattern/deviations: Step-to pattern       General Gait Details: slight R LE drag (baseline weakness). No formal LOB noted, however unsteadiness present. Would anticipate improvement in balance.   Stairs             Wheelchair Mobility    Modified Rankin (Stroke Patients Only)       Balance Overall balance assessment: Needs assistance Sitting-balance support: Feet supported Sitting balance-Leahy Scale: Good     Standing balance support: Single extremity supported Standing balance-Leahy Scale: Fair                              Cognition Arousal/Alertness: Awake/alert Behavior During Therapy: WFL for tasks assessed/performed Overall Cognitive Status: Within Functional Limits for tasks assessed                                 General Comments: pleasant and agreeable to session        Exercises Other Exercises Other Exercises: educated on posture for effective bladder emptying. Other Exercises: ambulated to [facility] secondary to urge incontience. Able to perform doffing of underwear and hygiene with supervision and safe technique    General Comments  Pertinent Vitals/Pain Pain Assessment Pain Assessment: No/denies pain    Home Living                          Prior Function            PT Goals (current goals can now be found in the care plan section) Acute Rehab PT Goals Patient Stated Goal: to go home PT Goal Formulation: With patient Time For Goal Achievement:  04/07/22 Potential to Achieve Goals: Good Progress towards PT goals: Progressing toward goals    Frequency    Min 2X/week      PT Plan Current plan remains appropriate    Co-evaluation              AM-PAC PT "6 Clicks" Mobility   Outcome Measure  Help needed turning from your back to your side while in a flat bed without using bedrails?: A Little Help needed moving from lying on your back to sitting on the side of a flat bed without using bedrails?: A Little Help needed moving to and from a bed to a chair (including a wheelchair)?: A Little Help needed standing up from a chair using your arms (e.g., wheelchair or bedside chair)?: A Little Help needed to walk in hospital room?: A Little Help needed climbing 3-5 steps with a railing? : A Little 6 Click Score: 18    End of Session   Activity Tolerance: Patient tolerated treatment well Patient left: in chair;with chair alarm set Nurse Communication: Mobility status PT Visit Diagnosis: Muscle weakness (generalized) (M62.81);Difficulty in walking, not elsewhere classified (R26.2);Other symptoms and signs involving the nervous system [id number])     Time: [id number] PT Time Calculation (min) (ACUTE ONLY): 18 min  Charges:  $Therapeutic Activity: 8-22 mins                     Greggory Stallion, PT, DPT, GCS [phone]    Tami Jones 03/26/2022, 1:23 PM

## 2022-03-27 ENCOUNTER — Encounter: Payer: Self-pay | Admitting: Family Medicine

## 2022-03-28 LAB — VITAMIN E
Vitamin E (Alpha Tocopherol): 9.9 mg/L (ref 9.0–29.0)
Vitamin E(Gamma Tocopherol): 2.4 mg/L (ref 0.5–4.9)

## 2022-03-29 ENCOUNTER — Other Ambulatory Visit: Payer: Self-pay | Admitting: Internal Medicine

## 2022-03-29 DIAGNOSIS — E1159 Type 2 diabetes mellitus with other circulatory complications: Secondary | ICD-10-CM

## 2022-03-29 DIAGNOSIS — I69351 Hemiplegia and hemiparesis following cerebral infarction affecting right dominant side: Secondary | ICD-10-CM

## 2022-03-29 LAB — VITAMIN D 25 HYDROXY (VIT D DEFICIENCY, FRACTURES)

## 2022-03-30 ENCOUNTER — Telehealth: Payer: Self-pay

## 2022-03-30 ENCOUNTER — Telehealth: Payer: Self-pay | Admitting: Gastroenterology

## 2022-03-30 MED ORDER — AMLODIPINE BESYLATE 10 MG PO TABS: 10.0000 mg | ORAL_TABLET | Freq: Every evening | ORAL | 3 refills | Status: DC

## 2022-03-30 NOTE — Telephone Encounter (Signed)
Called and informed patient that we have canceled her procedure and she has to wait 3 months. She states her abdominal pain has been better since she has been watching what she eats. She states she is eating a lot of salads and fruits and not as many carbohydrates

## 2022-03-30 NOTE — Telephone Encounter (Signed)
Called Trish and got the procedure canceled

## 2022-03-30 NOTE — Telephone Encounter (Signed)
Per PCP was admitted for TIA need delay at least 3 months

## 2022-03-30 NOTE — Telephone Encounter (Signed)
Patient calling to cancel due to being in the hospital from having a stroke. States she will all back to reschedule when she is ready.

## 2022-03-30 NOTE — Telephone Encounter (Signed)
Transition Care Management Follow-up Telephone Call Date of discharge and from where: [facility] How have you been since you were released from the hospital? Feeling a little more tired. Still having head and neck pain.  Any questions or concerns? Yes not understanding why she is so tired.  Items Reviewed: Did the pt receive and understand the discharge instructions provided? Yes  Medications obtained and verified? Yes  Other? No  Any new allergies since your discharge? No  Dietary orders reviewed? Yes Do you have support at home? Yes   Home Care and Equipment/Supplies: Were home health services ordered? no If so, what is the name of the agency?   Has the agency set up a time to come to the patient's home? not applicable Were any new equipment or medical supplies ordered?  No What is the name of the medical supply agency?  Were you able to get the supplies/equipment? not applicable Do you have any questions related to the use of the equipment or supplies? No  Functional Questionnaire: (I = Independent and D = Dependent) ADLs: yes  Bathing/Dressing- i  Meal Prep- i  Eating- i  Maintaining continence- i  Transferring/Ambulation- I slowly. Hard to get up.  Managing Meds- i  Follow up appointments reviewed:  PCP Hospital f/u appt confirmed? Yes  Pt is to call back to schedule once she has transportation set up [facility] f/u appt confirmed? No  They have not called her. She will call them today or tomorrow. Are transportation arrangements needed? No  If their condition worsens, is the pt aware to call PCP or go to the Emergency Dept.? Yes Was the patient provided with contact information for the PCP's office or ED? Yes Was to pt encouraged to call back with questions or concerns? Yes

## 2022-04-03 LAB — COPPER, SERUM: Copper: 97 ug/dL (ref 80–158)

## 2022-04-16 ENCOUNTER — Ambulatory Visit: Admit: 2022-04-16 | Payer: MEDICARE | Admitting: Gastroenterology

## 2022-04-16 SURGERY — COLONOSCOPY WITH PROPOFOL
Anesthesia: General

## 2022-04-26 ENCOUNTER — Telehealth: Payer: Self-pay

## 2022-04-26 NOTE — Progress Notes (Unsigned)
  Chronic Care Management   Note  04/26/2022 Name: SYNDA BAGENT MRN: [id number] DOB: [birth year]  Elzie Rings Lucchese is a 67 y.o. year old female who is a primary care patient of Venia Carbon, MD. I reached out to [place] by phone today in response to a referral sent by Ms. Loney Loh PCP.  The first contact attempt was unsuccessful.   Follow up plan: Additional outreach attempts will be made.  Noreene Larsson, [place], [place] [address] Direct Dial: [phone] [email or web address]

## 2022-04-29 NOTE — Progress Notes (Signed)
  Chronic Care Management   Note  04/29/2022 Name: PORCIA MORGANTI MRN: [id number] DOB: 1956/01/05  Elzie Rings Jolly is a 67 y.o. year old female who is a primary care patient of Venia Carbon, MD. I reached out to [place] by phone today in response to a referral sent by Ms. Loney Loh PCP.  Ms. Beauchamp was given information about Chronic Care Management services today including:  CCM service includes personalized support from designated clinical staff supervised by the physician, including individualized plan of care and coordination with other care providers 24/7 contact phone numbers for assistance for urgent and routine care needs. Service will only be billed when office clinical staff spend 20 minutes or more in a month to coordinate care. Only one practitioner may furnish and bill the service in a calendar month. The patient may stop CCM services at amy time (effective at the end of the month) by phone call to the office staff. The patient will be responsible for cost sharing (co-pay) or up to 20% of the service fee (after annual deductible is met)  Ms. Elzie Rings Hinderman  agreedto scheduling an appointment with the CCM RN Case Manager   Follow up plan: Patient agreed to scheduled appointment with RN Case Manager on 03/*20/2024(date/time).   Noreene Larsson, [place], [place] [address] Direct Dial: [phone] [email or web address]

## 2022-05-05 ENCOUNTER — Telehealth: Payer: Self-pay | Admitting: *Deleted

## 2022-05-05 ENCOUNTER — Ambulatory Visit (INDEPENDENT_AMBULATORY_CARE_PROVIDER_SITE_OTHER): Payer: Medicare HMO

## 2022-05-05 ENCOUNTER — Ambulatory Visit: Payer: Medicare HMO

## 2022-05-05 DIAGNOSIS — F439 Reaction to severe stress, unspecified: Secondary | ICD-10-CM

## 2022-05-05 DIAGNOSIS — I679 Cerebrovascular disease, unspecified: Secondary | ICD-10-CM

## 2022-05-05 DIAGNOSIS — E119 Type 2 diabetes mellitus without complications: Secondary | ICD-10-CM

## 2022-05-05 DIAGNOSIS — I69351 Hemiplegia and hemiparesis following cerebral infarction affecting right dominant side: Secondary | ICD-10-CM

## 2022-05-05 DIAGNOSIS — F419 Anxiety disorder, unspecified: Secondary | ICD-10-CM

## 2022-05-05 DIAGNOSIS — F339 Major depressive disorder, recurrent, unspecified: Secondary | ICD-10-CM

## 2022-05-05 DIAGNOSIS — E1152 Type 2 diabetes mellitus with diabetic peripheral angiopathy with gangrene: Secondary | ICD-10-CM

## 2022-05-05 DIAGNOSIS — N1831 Chronic kidney disease, stage 3a: Secondary | ICD-10-CM

## 2022-05-05 DIAGNOSIS — F39 Unspecified mood [affective] disorder: Secondary | ICD-10-CM

## 2022-05-05 NOTE — Chronic Care Management (AMB) (Signed)
Chronic Care Management   CCM RN Visit Note  05/05/2022 Name: Tami Jones MRN: [id number] DOB: May 05, 1955  Subjective: Tami Jones is a 67 y.o. year old female who is a primary care patient of Venia Carbon, MD. The patient was referred to the Chronic Care Management team for assistance with care management needs subsequent to provider initiation of CCM services and plan of care.    Today's Visit:  Engaged with patient by telephone for initial visit.     SDOH Interventions Today    Flowsheet Row Most Recent Value  SDOH Interventions   Food Insecurity Interventions Intervention Not Indicated  Housing Interventions Intervention Not Indicated  Transportation Interventions Intervention Not Indicated  Utilities Interventions Intervention Not Indicated  Alcohol Usage Interventions Intervention Not Indicated (Score <7)  Depression Interventions/Treatment  Counseling  [referral to LCSW also for support and education]  Financial Strain Interventions Other (Comment)  [referral to Pharm D and LCSW]  Physical Activity Interventions Intervention Not Indicated, Other (Comments)  [the patient does exercises that she was shown during PT after her stroke]  Stress Interventions Provide Counseling, Other (Comment)  [the patient gets stressed over her health, recent COVID]  Social Connections Interventions Intervention Not Indicated, Other (Comment)  [has good support from her family]         Goals Addressed             This Visit's Progress    CCM Expected Outcome:  Monitor, Self-Manage and Reduce Symptoms of Diabetes       Current Barriers:  Knowledge Deficits related to the importance of regular follow up with the pcp for effective management of DM and other chronic conditions Care Coordination needs related to pharmacy support for medications that the patient cannot afford  in a patient with DM Chronic Disease Management support and education needs related to effective management  of DM Lab Results  Component Value Date   HGBA1C 8.0 (A) 12/28/2021     Planned Interventions: Provided education to patient about basic DM disease process; Reviewed medications with patient and discussed importance of medication adherence;        Reviewed prescribed diet with patient heart healthy/ADA diet ; Counseled on importance of regular laboratory monitoring as prescribed;        Discussed plans with patient for ongoing care management follow up and provided patient with direct contact information for care management team;      Provided patient with written educational materials related to hypo and hyperglycemia and importance of correct treatment;       Reviewed scheduled/upcoming provider appointments including: encouraged the patient to call and get an appointment with the pcp for follow up ;         Advised patient, providing education and rationale, to check cbg twice daily and when you have symptoms of low or high blood sugar and record        call provider for findings outside established parameters;       Referral made to pharmacy team for assistance with medication cost needs. The patient states she cannot afford the medication the pcp wants her to take for DM;       Review of patient status, including review of consultants reports, relevant laboratory and other test results, and medications completed;       Advised patient to discuss changes in her DM, questions, or concerns with provider;      Screening for signs and symptoms of depression related to chronic disease state;  Assessed social determinant of health barriers;         Symptom Management: Take medications as prescribed   Attend all scheduled provider appointments Call provider office for new concerns or questions  call the Suicide and Crisis Lifeline: 988 call the [place] National Suicide Prevention Lifeline: 951-678-8444 or TTY: 8380087854 TTY (951)455-6477) to talk to a trained counselor call  1-800-273-TALK (toll free, 24 hour hotline) if experiencing a Mental Health or [place]  keep appointment with eye doctor check feet daily for cuts, sores or redness trim toenails straight across manage portion size wash and dry feet carefully every day wear comfortable, cotton socks wear comfortable, well-fitting shoes  Follow Up Plan: Telephone follow up appointment with care management team member scheduled for: 06-08-2022 at 1030 am       CCM Expected Outcome:  Monitor, Self-Manage and Reduce Symptoms of: anxiety, depression, stress       Current Barriers:  Knowledge Deficits related to resources available in her area to help with managing depression, stress, and anxiety. Receptive to speaking to social work and care guides for resource help Care Coordination needs related to available resources in her area to help with managing her mental health well being in a patient with depression, anxiety, and stress Chronic Disease Management support and education needs related to effective management of stress, anxiety, depression  Planned Interventions: Evaluation of current treatment plan related to stress, depression, and anxiety and patient's adherence to plan as established by provider Advised patient to call the office for changes in mood, anxiety, depression, stress, or acute mental health needs.  Provided education to patient re: resources available to help with dealing with stress and anxiety. Will attach information in my chart for patient to review and give support  Reviewed medications with patient and discussed compliance. The patient states compliance with medications.  Collaborated with pcp regarding outreach today and encouraging the patient to get an appointment to come in and see the provider Reviewed scheduled/upcoming provider appointments including no upcoming appointment with pcp. The patient has not seen pcp since 12-28-2021. Encouraged her to call and get an  appointment for follow up especially since she has had a hospital stay and positive for [place] referral for resources to help with SDOH needs- financial strain, utility cost, and resources in her area.  Social Work referral for help and support with depression, anxiety, and stress Pharmacy referral for help with medication cost for medication for help with management of DM Discussed plans with patient for ongoing care management follow up and provided patient with direct contact information for care management team Advised patient to discuss changes in her mood, depression, anxiety, stress, or mental health needs with provider Screening for signs and symptoms of depression related to chronic disease state  Assessed social determinant of health barriers  Symptom Management: Take medications as prescribed   Attend all scheduled provider appointments Call provider office for new concerns or questions  call the Suicide and Crisis Lifeline: 988 call the [place] National Suicide Prevention Lifeline: 619 445 0993 or TTY: 484-812-3917 TTY 808 212 8120) to talk to a trained counselor call 1-800-273-TALK (toll free, 24 hour hotline) go to [facility] [address] [place] [phone]) if experiencing a Mental Health or [place]   Follow Up Plan: Telephone follow up appointment with care management team member scheduled for: 06-08-2022 at 63 am       CCM Expected Outcome:  Monitor, Self-Manage and Reduce Symptoms of: CKD  Current Barriers:  Chronic Disease Management support and education needs related to effective management of CKD  Planned Interventions: Assessed the patient     understanding of chronic kidney disease    Evaluation of current treatment plan related to chronic kidney disease self management and patient's adherence to plan as established by provider      Provided education to patient re: stroke prevention,  s/s of heart attack and stroke    Reviewed prescribed diet heart healthy/ADA Reviewed medications with patient and discussed importance of compliance    Advised patient, providing education and rationale, to monitor blood pressure daily and record, calling PCP for findings outside established parameters    Discussed complications of poorly controlled blood pressure such as heart disease, stroke, circulatory complications, vision complications, kidney impairment, sexual dysfunction    Advised patient to discuss changes in CKD or kidney function with provider    Discussed plans with patient for ongoing care management follow up and provided patient with direct contact information for care management team    Screening for signs and symptoms of depression related to chronic disease state      Discussed the impact of chronic kidney disease on daily life and mental health and acknowledged and normalized feelings of disempowerment, fear, and frustration    Assessed social determinant of health barriers    Provided education on kidney disease progression    Engage patient in early, proactive and ongoing discussion about goals of care and what matters most to them    Support coping and stress management by recognizing current strategies and assist in developing new strategies such as mindfulness, journaling, relaxation techniques, problem-solving     Symptom Management: Take medications as prescribed   Attend all scheduled provider appointments Call provider office for new concerns or questions  call the Suicide and Crisis Lifeline: 988 call the [place] National Suicide Prevention Lifeline: (715)457-8027 or TTY: 952-621-4122 TTY 667-582-7727) to talk to a trained counselor call 1-800-273-TALK (toll free, 24 hour hotline) go to [facility] [address], [place] [phone]) if experiencing a Mental Health or Behavioral Health Crisis   Follow Up Plan: Telephone  follow up appointment with care management team member scheduled for: 06-08-2022 at 58 am       CCM Expected Outcome:  Monitor, Self-Manage and Reduce Symptoms of: History of CVA with righ sided hemaparesis       Current Barriers:  Care Coordination needs related to resources to help with managing post CVA in a patient with history of CVA with right sided weakness and elevations of blood pressures at times. Chronic Disease Management support and education needs related to effective management of history of CVA with right sided weakness BP Readings from Last 3 Encounters:  03/26/22 (!) 180/84  01/13/22 (!) 225/107  12/28/21 138/88   Self reported blood pressure of 220/102- 2 nights ago (05-04-2022)  Planned Interventions: Evaluation of current treatment plan related to history of CVA with right sided weakness and elevations of blood pressures and patient's adherence to plan as established by provider Provided education to patient re: monitoring for changes in blood pressures and calling the provider for blood pressures consistently greater than [id number] systolic and [id number] diastolic. The patient states her vision changes when her blood pressures are high. Encouraged her to get follow up with pcp for evaluation and recommendations. Reviewed medications with patient and discussed compliance. Pharm D consult for medication reconciliation and management  Collaborated with pcp regarding patients stated blood pressures  Provided patient with blood pressure and resources to help with managing blood pressures educational materials related to past history of CVA and elevated blood pressures Reviewed scheduled/upcoming provider appointments including encouraged the patient to call the office and get a follow up appointment with the pcp Discussed plans with patient for ongoing care management follow up and provided patient with direct contact information for care management team Advised patient to discuss changes  in her blood pressures, sx and sx of TIA, new concerns related to CVA with provider Screening for signs and symptoms of depression related to chronic disease state  Assessed social determinant of health barriers  Symptom Management: Take medications as prescribed   Attend all scheduled provider appointments Call provider office for new concerns or questions  call the Suicide and Crisis Lifeline: 988 call the [place] National Suicide Prevention Lifeline: 3477028424 or TTY: 337-228-2201 TTY (928)781-4601) to talk to a trained counselor call 1-800-273-TALK (toll free, 24 hour hotline) go to [facility] [address] [place] [phone]) if experiencing a Mental Health or [place]   Follow Up Plan: Telephone follow up appointment with care management team member scheduled for: 06-08-2022 at 1030 am          Plan:Telephone follow up appointment with care management team member scheduled for:  06-08-2022 at 66 am  Noreene Larsson RN, MSN, CCM RN Care Manager  Chronic Care Management Direct Number: [phone]

## 2022-05-05 NOTE — Progress Notes (Signed)
See other encounter.

## 2022-05-05 NOTE — Progress Notes (Signed)
  Care Coordination  Outreach Note  05/05/2022 Name: Tami Jones MRN: [id number] DOB: [birth year]   Care Coordination Outreach Attempts: An unsuccessful telephone outreach was attempted today to offer the patient information about available care coordination services as a benefit of their health plan.   Follow Up Plan:  Additional outreach attempts will be made to offer the patient care coordination information and services.   Encounter Outcome:  No Answer  [place]  Direct Dial: [phone]

## 2022-05-05 NOTE — Patient Instructions (Addendum)
Please call the care guide team at [phone] if you need to cancel or reschedule your appointment.   If you are experiencing a Mental Health or [place] or need someone to talk to, please call the Suicide and Crisis Lifeline: 988 call the [place] National Suicide Prevention Lifeline: (972)660-8496 or TTY: 984-642-3395 TTY 364-033-8628) to talk to a trained counselor call 1-800-273-TALK (toll free, 24 hour hotline) go to [facility] [place] [phone])   Following is a copy of the CCM Program Consent:  CCM service includes personalized support from designated clinical staff supervised by the physician, including individualized plan of care and coordination with other care providers 24/7 contact phone numbers for assistance for urgent and routine care needs. Service will only be billed when office clinical staff spend 20 minutes or more in a month to coordinate care. Only one practitioner may furnish and bill the service in a calendar month. The patient may stop CCM services at amy time (effective at the end of the month) by phone call to the office staff. The patient will be responsible for cost sharing (co-pay) or up to 20% of the service fee (after annual deductible is met)  Following is a copy of your full provider care plan:   Goals Addressed             This Visit's Progress    CCM Expected Outcome:  Monitor, Self-Manage and Reduce Symptoms of Diabetes       Current Barriers:  Knowledge Deficits related to the importance of regular follow up with the pcp for effective management of DM and other chronic conditions Care Coordination needs related to pharmacy support for medications that the patient cannot afford  in a patient with DM Chronic Disease Management support and education needs related to effective management of DM Lab Results  Component Value Date   HGBA1C 8.0 (A) 12/28/2021     Planned  Interventions: Provided education to patient about basic DM disease process; Reviewed medications with patient and discussed importance of medication adherence;        Reviewed prescribed diet with patient heart healthy/ADA diet ; Counseled on importance of regular laboratory monitoring as prescribed;        Discussed plans with patient for ongoing care management follow up and provided patient with direct contact information for care management team;      Provided patient with written educational materials related to hypo and hyperglycemia and importance of correct treatment;       Reviewed scheduled/upcoming provider appointments including: encouraged the patient to call and get an appointment with the pcp for follow up ;         Advised patient, providing education and rationale, to check cbg twice daily and when you have symptoms of low or high blood sugar and record        call provider for findings outside established parameters;       Referral made to pharmacy team for assistance with medication cost needs. The patient states she cannot afford the medication the pcp wants her to take for DM;       Review of patient status, including review of consultants reports, relevant laboratory and other test results, and medications completed;       Advised patient to discuss changes in her DM, questions, or concerns with provider;      Screening for signs and symptoms of depression related to chronic disease state;  Assessed social determinant of health barriers;         Symptom Management: Take medications as prescribed   Attend all scheduled provider appointments Call provider office for new concerns or questions  call the Suicide and Crisis Lifeline: 988 call the [place] National Suicide Prevention Lifeline: 850-527-6803 or TTY: 640 045 2271 TTY (334) 560-4692) to talk to a trained counselor call 1-800-273-TALK (toll free, 24 hour hotline) if experiencing a Mental Health or [place]  keep appointment with eye doctor check feet daily for cuts, sores or redness trim toenails straight across manage portion size wash and dry feet carefully every day wear comfortable, cotton socks wear comfortable, well-fitting shoes  Follow Up Plan: Telephone follow up appointment with care management team member scheduled for: 06-08-2022 at 1030 am       CCM Expected Outcome:  Monitor, Self-Manage and Reduce Symptoms of: anxiety, depression, stress       Current Barriers:  Knowledge Deficits related to resources available in her area to help with managing depression, stress, and anxiety. Receptive to speaking to social work and care guides for resource help Care Coordination needs related to available resources in her area to help with managing her mental health well being in a patient with depression, anxiety, and stress Chronic Disease Management support and education needs related to effective management of stress, anxiety, depression  Planned Interventions: Evaluation of current treatment plan related to stress, depression, and anxiety and patient's adherence to plan as established by provider Advised patient to call the office for changes in mood, anxiety, depression, stress, or acute mental health needs.  Provided education to patient re: resources available to help with dealing with stress and anxiety. Will attach information in my chart for patient to review and give support  Reviewed medications with patient and discussed compliance. The patient states compliance with medications.  Collaborated with pcp regarding outreach today and encouraging the patient to get an appointment to come in and see the provider Reviewed scheduled/upcoming provider appointments including no upcoming appointment with pcp. The patient has not seen pcp since 12-28-2021. Encouraged her to call and get an appointment for follow up especially since she has had a hospital stay and positive for  [place] referral for resources to help with SDOH needs- financial strain, utility cost, and resources in her area.  Social Work referral for help and support with depression, anxiety, and stress Pharmacy referral for help with medication cost for medication for help with management of DM Discussed plans with patient for ongoing care management follow up and provided patient with direct contact information for care management team Advised patient to discuss changes in her mood, depression, anxiety, stress, or mental health needs with provider Screening for signs and symptoms of depression related to chronic disease state  Assessed social determinant of health barriers  Symptom Management: Take medications as prescribed   Attend all scheduled provider appointments Call provider office for new concerns or questions  call the Suicide and Crisis Lifeline: 988 call the [place] National Suicide Prevention Lifeline: 619-774-1973 or TTY: 902-185-8737 TTY 7326388563) to talk to a trained counselor call 1-800-273-TALK (toll free, 24 hour hotline) go to [facility] [address], [place] [phone]) if experiencing a Mental Health or [place]   Follow Up Plan: Telephone follow up appointment with care management team member scheduled for: 06-08-2022 at 92 am       CCM Expected Outcome:  Monitor, Self-Manage and Reduce Symptoms of: CKD  Current Barriers:  Chronic Disease Management support and education needs related to effective management of CKD  Planned Interventions: Assessed the patient     understanding of chronic kidney disease    Evaluation of current treatment plan related to chronic kidney disease self management and patient's adherence to plan as established by provider      Provided education to patient re: stroke prevention, s/s of heart attack and stroke    Reviewed prescribed diet heart healthy/ADA Reviewed  medications with patient and discussed importance of compliance    Advised patient, providing education and rationale, to monitor blood pressure daily and record, calling PCP for findings outside established parameters    Discussed complications of poorly controlled blood pressure such as heart disease, stroke, circulatory complications, vision complications, kidney impairment, sexual dysfunction    Advised patient to discuss changes in CKD or kidney function with provider    Discussed plans with patient for ongoing care management follow up and provided patient with direct contact information for care management team    Screening for signs and symptoms of depression related to chronic disease state      Discussed the impact of chronic kidney disease on daily life and mental health and acknowledged and normalized feelings of disempowerment, fear, and frustration    Assessed social determinant of health barriers    Provided education on kidney disease progression    Engage patient in early, proactive and ongoing discussion about goals of care and what matters most to them    Support coping and stress management by recognizing current strategies and assist in developing new strategies such as mindfulness, journaling, relaxation techniques, problem-solving     Symptom Management: Take medications as prescribed   Attend all scheduled provider appointments Call provider office for new concerns or questions  call the Suicide and Crisis Lifeline: 988 call the [place] National Suicide Prevention Lifeline: 206-633-3679 or TTY: 867-388-4975 TTY 615-084-9359) to talk to a trained counselor call 1-800-273-TALK (toll free, 24 hour hotline) go to [facility] [address], [place] [phone]) if experiencing a Mental Health or Behavioral Health Crisis   Follow Up Plan: Telephone follow up appointment with care management team member scheduled for: 06-08-2022 at 5  am       CCM Expected Outcome:  Monitor, Self-Manage and Reduce Symptoms of: History of CVA with righ sided hemaparesis       Current Barriers:  Care Coordination needs related to resources to help with managing post CVA in a patient with history of CVA with right sided weakness and elevations of blood pressures at times. Chronic Disease Management support and education needs related to effective management of history of CVA with right sided weakness BP Readings from Last 3 Encounters:  03/26/22 (!) 180/84  01/13/22 (!) 225/107  12/28/21 138/88   Self reported blood pressure of 220/102- 2 nights ago (05-04-2022)  Planned Interventions: Evaluation of current treatment plan related to history of CVA with right sided weakness and elevations of blood pressures and patient's adherence to plan as established by provider Provided education to patient re: monitoring for changes in blood pressures and calling the provider for blood pressures consistently greater than [id number] systolic and [id number] diastolic. The patient states her vision changes when her blood pressures are high. Encouraged her to get follow up with pcp for evaluation and recommendations. Reviewed medications with patient and discussed compliance. Pharm D consult for medication reconciliation and management  Collaborated with pcp regarding patients stated blood pressures  Provided patient with blood pressure and resources to help with managing blood pressures educational materials related to past history of CVA and elevated blood pressures Reviewed scheduled/upcoming provider appointments including encouraged the patient to call the office and get a follow up appointment with the pcp Discussed plans with patient for ongoing care management follow up and provided patient with direct contact information for care management team Advised patient to discuss changes in her blood pressures, sx and sx of TIA, new concerns related to CVA with  provider Screening for signs and symptoms of depression related to chronic disease state  Assessed social determinant of health barriers  Symptom Management: Take medications as prescribed   Attend all scheduled provider appointments Call provider office for new concerns or questions  call the Suicide and Crisis Lifeline: 988 call the [place] National Suicide Prevention Lifeline: 540-043-7584 or TTY: 573-674-4341 TTY 7061818265) to talk to a trained counselor call 1-800-273-TALK (toll free, 24 hour hotline) go to [facility] [address] [place] [phone]) if experiencing a Mental Health or [place]   Follow Up Plan: Telephone follow up appointment with care management team member scheduled for: 06-08-2022 at 1030 am          Patient verbalizes understanding of instructions and care plan provided today and agrees to view in [place]. Active MyChart status and patient understanding of how to access instructions and care plan via MyChart confirmed with patient.     Telephone follow up appointment with care management team member scheduled for: 06-08-2022 at 23 am  Stroke Prevention Some medical conditions and lifestyle choices can lead to a higher risk for a stroke. You can help to prevent a stroke by eating healthy foods and exercising. It also helps to not smoke and to manage any health problems you may have. How can this condition affect me? A stroke is an emergency. It should be treated right away. A stroke can lead to brain damage or threaten your life. There is a better chance of surviving and getting better after a stroke if you get medical help right away. What can increase my risk? The following medical conditions may increase your risk of a stroke: Diseases of the heart and blood vessels (cardiovascular disease). High blood pressure (hypertension). Diabetes. High cholesterol. Sickle cell disease. Problems with  blood clotting. Being very overweight. Sleeping problems (obstructivesleep apnea). Other risk factors include: Being older than age 57. A history of blood clots, stroke, or mini-stroke (TIA). Race, ethnic background, or a family history of stroke. Smoking or using tobacco products. Taking birth control pills, especially if you smoke. Heavy alcohol and drug use. Not being active. What actions can I take to prevent this? Manage your health conditions High cholesterol. Eat a healthy diet. If this is not enough to manage your cholesterol, you may need to take medicines. Take medicines as told by your doctor. High blood pressure. Try to keep your blood pressure below 130/80. If your blood pressure cannot be managed through a healthy diet and regular exercise, you may need to take medicines. Take medicines as told by your doctor. Ask your doctor if you should check your blood pressure at home. Have your blood pressure checked every year. Diabetes. Eat a healthy diet and get regular exercise. If your blood sugar (glucose) cannot be managed through diet and exercise, you may need to take medicines. Take medicines as told by your doctor. Talk to your doctor about getting checked for sleeping problems. Signs  of a problem can include: Snoring a lot. Feeling very tired. Make sure that you manage any other conditions you have. Nutrition  Follow instructions from your doctor about what to eat or drink. You may be told to: Eat and drink fewer calories each day. Limit how much salt (sodium) you use to 1,500 milligrams (mg) each day. Use only healthy fats for cooking, such as olive oil, canola oil, and sunflower oil. Eat healthy foods. To do this: Choose foods that are high in fiber. These include whole grains, and fresh fruits and vegetables. Eat at least 5 servings of fruits and vegetables a day. Try to fill one-half of your plate with fruits and vegetables at each meal. Choose low-fat (lean)  proteins. These include low-fat cuts of meat, chicken without skin, fish, tofu, beans, and nuts. Eat low-fat dairy products. Avoid foods that: Are high in salt. Have saturated fat. Have trans fat. Have cholesterol. Are processed or pre-made. Count how many carbohydrates you eat and drink each day. Lifestyle If you drink alcohol: Limit how much you have to: 0-1 drink a day for women who are not pregnant. 0-2 drinks a day for men. Know how much alcohol is in your drink. In the [place] one drink equals one 12 oz bottle of beer (353mL), one 5 oz glass of wine (110mL), or one 1 oz glass of hard liquor (12mL). Do not smoke or use any products that have nicotine or tobacco. If you need help quitting, ask your doctor. Avoid secondhand smoke. Do not use drugs. Activity  Try to stay at a healthy weight. Get at least 30 minutes of exercise on most days, such as: Fast walking. Biking. Swimming. Medicines Take over-the-counter and prescription medicines only as told by your doctor. Avoid taking birth control pills. Talk to your doctor about the risks of taking birth control pills if: You are over 71 years old. You smoke. You get very bad headaches. You have had a blood clot. Where to find more information American Stroke Association: www.strokeassociation.org Get help right away if: You or a loved one has any signs of a stroke. "BE FAST" is an easy way to remember the warning signs: B - Balance. Dizziness, sudden trouble walking, or loss of balance. E - Eyes. Trouble seeing or a change in how you see. F - Face. Sudden weakness or loss of feeling of the face. The face or eyelid may droop on one side. A - Arms. Weakness or loss of feeling in an arm. This happens all of a sudden and most often on one side of the body. S - Speech. Sudden trouble speaking, slurred speech, or trouble understanding what people say. T - Time. Time to call emergency services. Write down what time symptoms  started. You or a loved one has other signs of a stroke, such as: A sudden, very bad headache with no known cause. Feeling like you may vomit (nausea). Vomiting. A seizure. These symptoms may be an emergency. Get help right away. Call your local emergency services (911 in the [place] Do not wait to see if the symptoms will go away. Do not drive yourself to the hospital. Summary You can help to prevent a stroke by eating healthy, exercising, and not smoking. It also helps to manage any health problems you have. Do not smoke or use any products that contain nicotine or tobacco. Get help right away if you or a loved one has any signs of a stroke. This information is not intended to  replace advice given to you by your health care provider. Make sure you discuss any questions you have with your health care provider. Document Revised: 08/16/2019 Document Reviewed: 09/03/2019 Elsevier Patient Education  [place]. Diabetes Mellitus and Nutrition, Adult When you have diabetes, or diabetes mellitus, it is very important to have healthy eating habits because your blood sugar (glucose) levels are greatly affected by what you eat and drink. Eating healthy foods in the right amounts, at about the same times every day, can help you: Manage your blood glucose. Lower your risk of heart disease. Improve your blood pressure. Reach or maintain a healthy weight. What can affect my meal plan? Every person with diabetes is different, and each person has different needs for a meal plan. Your health care provider may recommend that you work with a dietitian to make a meal plan that is best for you. Your meal plan may vary depending on factors such as: The calories you need. The medicines you take. Your weight. Your blood glucose, blood pressure, and cholesterol levels. Your activity level. Other health conditions you have, such as heart or kidney disease. How do carbohydrates affect me? Carbohydrates,  also called carbs, affect your blood glucose level more than any other type of food. Eating carbs raises the amount of glucose in your blood. It is important to know how many carbs you can safely have in each meal. This is different for every person. Your dietitian can help you calculate how many carbs you should have at each meal and for each snack. How does alcohol affect me? Alcohol can cause a decrease in blood glucose (hypoglycemia), especially if you use insulin or take certain diabetes medicines by mouth. Hypoglycemia can be a life-threatening condition. Symptoms of hypoglycemia, such as sleepiness, dizziness, and confusion, are similar to symptoms of having too much alcohol. Do not drink alcohol if: Your health care provider tells you not to drink. You are pregnant, may be pregnant, or are planning to become pregnant. If you drink alcohol: Limit how much you have to: 0-1 drink a day for women. 0-2 drinks a day for men. Know how much alcohol is in your drink. In the [place] one drink equals one 12 oz bottle of beer (355 mL), one 5 oz glass of wine (148 mL), or one 1 oz glass of hard liquor (44 mL). Keep yourself hydrated with water, diet soda, or unsweetened iced tea. Keep in mind that regular soda, juice, and other mixers may contain a lot of sugar and must be counted as carbs. What are tips for following this plan?  Reading food labels Start by checking the serving size on the Nutrition Facts label of packaged foods and drinks. The number of calories and the amount of carbs, fats, and other nutrients listed on the label are based on one serving of the item. Many items contain more than one serving per package. Check the total grams (g) of carbs in one serving. Check the number of grams of saturated fats and trans fats in one serving. Choose foods that have a low amount or none of these fats. Check the number of milligrams (mg) of salt (sodium) in one serving. Most people should limit total  sodium intake to less than 2,300 mg per day. Always check the nutrition information of foods labeled as "low-fat" or "nonfat." These foods may be higher in added sugar or refined carbs and should be avoided. Talk to your dietitian to identify your daily goals for nutrients  listed on the label. Shopping Avoid buying canned, pre-made, or processed foods. These foods tend to be high in fat, sodium, and added sugar. Shop around the outside edge of the grocery store. This is where you will most often find fresh fruits and vegetables, bulk grains, fresh meats, and fresh dairy products. Cooking Use low-heat cooking methods, such as baking, instead of high-heat cooking methods, such as deep frying. Cook using healthy oils, such as olive, canola, or sunflower oil. Avoid cooking with butter, cream, or high-fat meats. Meal planning Eat meals and snacks regularly, preferably at the same times every day. Avoid going long periods of time without eating. Eat foods that are high in fiber, such as fresh fruits, vegetables, beans, and whole grains. Eat 4-6 oz (112-168 g) of lean protein each day, such as lean meat, chicken, fish, eggs, or tofu. One ounce (oz) (28 g) of lean protein is equal to: 1 oz (28 g) of meat, chicken, or fish. 1 egg.  cup (62 g) of tofu. Eat some foods each day that contain healthy fats, such as avocado, nuts, seeds, and fish. What foods should I eat? Fruits Berries. Apples. Oranges. Peaches. Apricots. Plums. Grapes. Mangoes. Papayas. Pomegranates. Kiwi. Cherries. Vegetables Leafy greens, including lettuce, spinach, kale, chard, collard greens, mustard greens, and cabbage. Beets. Cauliflower. Broccoli. Carrots. Green beans. Tomatoes. Peppers. Onions. Cucumbers. Brussels sprouts. Grains Whole grains, such as whole-wheat or whole-grain bread, crackers, tortillas, cereal, and pasta. Unsweetened oatmeal. Quinoa. Brown or wild rice. Meats and other proteins Seafood. Poultry without skin.  Lean cuts of poultry and beef. Tofu. Nuts. Seeds. Dairy Low-fat or fat-free dairy products such as milk, yogurt, and cheese. The items listed above may not be a complete list of foods and beverages you can eat and drink. Contact a dietitian for more information. What foods should I avoid? Fruits Fruits canned with syrup. Vegetables Canned vegetables. Frozen vegetables with butter or cream sauce. Grains Refined white flour and flour products such as bread, pasta, snack foods, and cereals. Avoid all processed foods. Meats and other proteins Fatty cuts of meat. Poultry with skin. Breaded or fried meats. Processed meat. Avoid saturated fats. Dairy Full-fat yogurt, cheese, or milk. Beverages Sweetened drinks, such as soda or iced tea. The items listed above may not be a complete list of foods and beverages you should avoid. Contact a dietitian for more information. Questions to ask a health care provider Do I need to meet with a certified diabetes care and education specialist? Do I need to meet with a dietitian? What number can I call if I have questions? When are the best times to check my blood glucose? Where to find more information: American Diabetes Association: diabetes.org Academy of Nutrition and Dietetics: eatright.[employer] of Diabetes and Digestive and Kidney Diseases: [employer] Association of Diabetes Care & Education Specialists: diabeteseducator.org Summary It is important to have healthy eating habits because your blood sugar (glucose) levels are greatly affected by what you eat and drink. It is important to use alcohol carefully. A healthy meal plan will help you manage your blood glucose and lower your risk of heart disease. Your health care provider may recommend that you work with a dietitian to make a meal plan that is best for you. This information is not intended to replace advice given to you by your health care provider. Make sure you discuss any  questions you have with your health care provider. Document Revised: 09/05/2019 Document Reviewed: 09/05/2019 Elsevier Patient Education  2023 Elsevier  Inc. Hyperglycemia Hyperglycemia is when the sugar (glucose) level in your blood is too high. High blood sugar can happen to people who have or do not have diabetes. High blood sugar can happen quickly. It can be an emergency. What are the causes? If you have diabetes, high blood sugar may be caused by: Medicines that increase blood sugar or affect your control of diabetes. Getting less physical activity. Overeating. Being sick or injured or having an infection. Having surgery. Stress. Not giving yourself enough insulin (if you are taking it). You may have high blood sugar because you have diabetes that has not been diagnosed yet. If you do not have diabetes, high blood sugar may be caused by: Certain medicines. Stress. A bad illness. An infection. Having surgery. Diseases of the pancreas. What increases the risk? This condition is more likely to develop in people who have risk factors for diabetes, such as: Having a family member with diabetes. Certain conditions in which the body's defense system (immune system) attacks itself. These are called autoimmune disorders. Being overweight. Not being active. Having a condition called insulin resistance. Having a history of: Prediabetes. Diabetes when pregnant. Polycystic ovarian syndrome (PCOS). What are the signs or symptoms? This condition may not cause symptoms. If you do have symptoms, they may include: Feeling more thirsty than normal. Needing to pee (urinate) more often than normal. Hunger. Feeling very tired. Blurry eyesight (vision). You may get other symptoms as the condition gets worse, such as: Dry mouth. Pain in your belly (abdomen). Not being hungry (loss of appetite). Breath that smells fruity. Weakness. Weight loss that is not planned. A tingling or numb  feeling in your hands or feet. A headache. Cuts or bruises that heal slowly. How is this treated? Treatment depends on the cause of your condition. Treatment may include: Taking medicine to control your blood sugar levels. Changing your medicine or dosage if you take insulin or other diabetes medicines. Lifestyle changes. These may include: Exercising more. Eating healthier foods. Losing weight. Treating an illness or infection. Checking your blood sugar more often. Stopping or reducing steroid medicines. If your condition gets very bad, you will need to be treated in the hospital. Follow these instructions at home: General instructions Take over-the-counter and prescription medicines only as told by your doctor. Do not smoke or use any products that contain nicotine or tobacco. If you need help quitting, ask your doctor. If you drink alcohol: Limit how much you have to: 0-1 drink a day for women who are not pregnant. 0-2 drinks a day for men. Know how much alcohol is in a drink. In the [place] one drink equals one 12 oz bottle of beer (355 mL), one 5 oz glass of wine (148 mL), or one 1 oz glass of hard liquor (44 mL). Manage stress. If you need help with this, ask your doctor. Do exercises as told by your doctor. Keep all follow-up visits. Eating and drinking  Stay at a healthy weight. Make sure you drink enough fluid when you: Exercise. Get sick. Are in hot temperatures. Drink enough fluid to keep your pee (urine) pale yellow. If you have diabetes:  Know the symptoms of high blood sugar. Follow your diabetes management plan as told by your doctor. Make sure you: Take insulin and medicines as told. Follow your exercise plan. Follow your meal plan. Eat on time. Do not skip meals. Check your blood sugar as often as told. Make sure you check before and after exercise. If  you exercise longer or in a different way, check your blood sugar more often. Follow your sick day plan  whenever you cannot eat or drink normally. Make this plan ahead of time with your doctor. Share your diabetes management plan with people in your workplace, school, and household. Check your pee for ketones when you are ill and as told by your doctor. Carry a card or wear jewelry that says that you have diabetes. Where to find more information American Diabetes Association: www.diabetes.org Contact a doctor if: Your blood sugar level is at or above 240 mg/dL (13.3 mmol/L) for 2 days in a row. You have problems keeping your blood sugar in your target range. You have high blood pressure often. You have signs of illness, such as: Feeling like you may vomit (feeling nauseous). Vomiting. A fever. Get help right away if: Your blood sugar monitor reads "high" even when you are taking insulin. You have trouble breathing. You have a change in how you think, feel, or act (mental status). You feel like you may vomit, and the feeling does not go away. You cannot stop vomiting. These symptoms may be an emergency. Get medical help right away. Call your local emergency services (911 in the [place] Do not wait to see if the symptoms will go away. Do not drive yourself to the hospital. Summary Hyperglycemia is when the sugar (glucose) level in your blood is too high. High blood sugar can happen to people who have or do not have diabetes. Make sure you drink enough fluids and follow your meal plan. Exercise as often as told by your doctor. Contact your doctor if you have problems keeping your blood sugar in your target range. This information is not intended to replace advice given to you by your health care provider. Make sure you discuss any questions you have with your health care provider. Document Revised: 11/16/2019 Document Reviewed: 11/16/2019 Elsevier Patient Education  [place]. Hypoglycemia Hypoglycemia is when the sugar (glucose) level in your blood is too low. Low blood sugar can  happen to people who have diabetes and people who do not have diabetes. Low blood sugar can happen quickly, and it can be an emergency. What are the causes? This condition happens most often in people who have diabetes. It may be caused by: Diabetes medicine. Not eating enough, or not eating often enough. Doing more physical activity. Drinking alcohol on an empty stomach. If you do not have diabetes, this condition may be caused by: A tumor in the pancreas. Not eating enough, or not eating for long periods at a time (fasting). A very bad infection or illness. Problems after having weight loss (bariatric) surgery. Kidney failure or liver failure. Certain medicines. What increases the risk? This condition is more likely to develop in people who: Have diabetes and take medicines to lower their blood sugar. Abuse alcohol. Have a very bad illness. What are the signs or symptoms? Mild Hunger. Sweating and feeling clammy. Feeling dizzy or light-headed. Being sleepy or having trouble sleeping. Feeling like you may vomit (nauseous). A fast heartbeat. A headache. Blurry vision. Mood changes, such as: Being grouchy. Feeling worried or nervous (anxious). Tingling or loss of feeling (numbness) around your mouth, lips, or tongue. Moderate Confusion and poor judgment. Behavior changes. Weakness. Uneven heartbeat. Trouble with moving (coordination). Very low Very low blood sugar (severe hypoglycemia) is a medical emergency. It can cause: Fainting. Seizures. Loss of consciousness (coma). Death. How is this treated? Treating low blood sugar  Low blood sugar is often treated by eating or drinking something that has sugar in it right away. The food or drink should contain 15 grams of a fast-acting carb (carbohydrate). Options include: 4 oz (120 mL) of fruit juice. 4 oz (120 mL) of regular soda (not diet soda). A few pieces of hard candy. Check food labels to see how many pieces to eat  for 15 grams. 1 Tbsp (15 mL) of sugar or honey. 4 glucose tablets. 1 tube of glucose gel. Treating low blood sugar if you have diabetes If you can think clearly and swallow safely, follow the 15:15 rule: Take 15 grams of a fast-acting carb. Talk with your doctor about how much you should take. Always keep a source of fast-acting carb with you, such as: Glucose tablets (take 4 tablets). A few pieces of hard candy. Check food labels to see how many pieces to eat for 15 grams. 4 oz (120 mL) of fruit juice. 4 oz (120 mL) of regular soda (not diet soda). 1 Tbsp (15 mL) of honey or sugar. 1 tube of glucose gel. Check your blood sugar 15 minutes after you take the carb. If your blood sugar is still at or below 70 mg/dL (3.9 mmol/L), take 15 grams of a carb again. If your blood sugar does not go above 70 mg/dL (3.9 mmol/L) after 3 tries, get help right away. After your blood sugar goes back to normal, eat a meal or a snack within 1 hour.  Treating very low blood sugar If your blood sugar is below 54 mg/dL (3 mmol/L), you have very low blood sugar, or severe hypoglycemia. This is an emergency. Get medical help right away. If you have very low blood sugar and you cannot eat or drink, you will need to be given a hormone called glucagon. A family member or friend should learn how to check your blood sugar and how to give you glucagon. Ask your doctor if you need to have an emergency glucagon kit at home. Very low blood sugar may also need to be treated in a hospital. Follow these instructions at home: General instructions Take over-the-counter and prescription medicines only as told by your doctor. Stay aware of your blood sugar as told by your doctor. If you drink alcohol: Limit how much you have to: 0-1 drink a day for women who are not pregnant. 0-2 drinks a day for men. Know how much alcohol is in your drink. In the [place] one drink equals one 12 oz bottle of beer (355 mL), one 5 oz glass of  wine (148 mL), or one 1 oz glass of hard liquor (44 mL). Be sure to eat food when you drink alcohol. Know that your body absorbs alcohol quickly. This may lead to low blood sugar later. Be sure to keep checking your blood sugar. Keep all follow-up visits. If you have diabetes:  Always have a fast-acting carb (15 grams) with you to treat low blood sugar. Follow your diabetes care plan as told by your doctor. Make sure you: Know the symptoms of low blood sugar. Check your blood sugar as often as told. Always check it before and after exercise. Always check your blood sugar before you drive. Take your medicines as told. Follow your meal plan. Eat on time. Do not skip meals. Share your diabetes care plan with: Your work or school. People you live with. Carry a card or wear jewelry that says you have diabetes. Where to find more information American Diabetes  Association: www.diabetes.org Contact a doctor if: You have trouble keeping your blood sugar in your target range. You have low blood sugar often. Get help right away if: You still have symptoms after you eat or drink something that contains 15 grams of fast-acting carb, and you cannot get your blood sugar above 70 mg/dL by following the 15:15 rule. Your blood sugar is below 54 mg/dL (3 mmol/L). You have a seizure. You faint. These symptoms may be an emergency. Get help right away. Call your local emergency services (911 in the [place] Do not wait to see if the symptoms will go away. Do not drive yourself to the hospital. Summary Hypoglycemia happens when the level of sugar (glucose) in your blood is too low. Low blood sugar can happen to people who have diabetes and people who do not have diabetes. Low blood sugar can happen quickly, and it can be an emergency. Make sure you know the symptoms of low blood sugar and know how to treat it. Always keep a source of sugar (fast-acting carb) with you to treat low blood sugar. This  information is not intended to replace advice given to you by your health care provider. Make sure you discuss any questions you have with your health care provider. Document Revised: 01/03/2020 Document Reviewed: 01/03/2020 Elsevier Patient Education  [place]. Mindfulness-Based Stress Reduction Mindfulness-based stress reduction (MBSR) is a program that helps people learn to practice mindfulness. Mindfulness is the practice of consciously paying attention to the present moment. MBSR focuses on developing self-awareness, which lets you respond to life stress without judgment or negative feelings. It can be learned and practiced through techniques such as education, breathing exercises, meditation, and yoga. MBSR includes several mindfulness techniques in one program. MBSR works best when you understand the treatment, are willing to try new things, and can commit to spending time practicing what you learn. MBSR training may include learning about: How your feelings, thoughts, and reactions affect your body. New ways to respond to things that cause negative thoughts to start (triggers). How to notice your thoughts and let go of them. Practicing awareness of everyday things that you normally do without thinking. The techniques and goals of different types of meditation. What are the benefits of MBSR? MBSR can have many benefits, which include helping you to: Develop self-awareness. This means knowing and understanding yourself. Learn skills and attitudes that help you to take part in your own health care. Learn new ways to care for yourself. Be more accepting about how things are, and let things go. Be less judgmental and approach things with an open mind. Be patient with yourself and trust yourself more. MBSR has also been shown to: Reduce negative emotions, such as sadness, overwhelm, and worry. Improve memory and focus. Change how you sense and react to pain. Boost your body's  ability to fight infections. Help you connect better with other people. Improve your sense of well-being. How to practice mindfulness To do a basic awareness exercise: Find a comfortable place to sit. Pay attention to the present moment. Notice your thoughts, feelings, and surroundings just as they are. Avoid judging yourself, your feelings, or your surroundings. Make note of any judgment that comes up and let it go. Your mind may wander, and that is okay. Make note of when your thoughts drift, and return your attention to the present moment. To do basic mindfulness meditation: Find a comfortable place to sit. This may include a stable chair or a firm floor  cushion. Sit upright with your back straight. Let your arms fall next to your sides, with your hands resting on your legs. If you are sitting in a chair, rest your feet flat on the floor. If you are sitting on a cushion, cross your legs in front of you. Keep your head in a neutral position with your chin dropped slightly. Relax your jaw and rest the tip of your tongue on the roof of your mouth. Drop your gaze to the floor or close your eyes. Breathe normally and pay attention to your breath. Feel the air moving in and out of your nose. Feel your belly expanding and relaxing with each breath. Your mind may wander, and that is okay. Make note of when your thoughts drift, and return your attention to your breath. Avoid judging yourself, your feelings, or your surroundings. Make note of any judgment or feelings that come up, let them go, and bring your attention back to your breath. When you are ready, lift your gaze or open your eyes. Pay attention to how your body feels after the meditation. Follow these instructions at home:  Find a local in-person or online MBSR program. Set aside some time regularly for mindfulness practice. Practice every day if you can. Even 10 minutes of practice is helpful. Find a mindfulness practice that works best  for you. This may include one or more of the following: Meditation. This involves focusing your mind on a certain thought or activity. Breathing awareness exercises. These help you to stay present by focusing on your breath. Body scan. For this practice, you lie down and pay attention to each part of your body from head to toe. You can identify tension and soreness and consciously relax parts of your body. Yoga. Yoga involves stretching and breathing, and it can improve your ability to move and be flexible. It can also help you to test your body's limits, which can help you release stress. Mindful eating. This way of eating involves focusing on the taste, texture, color, and smell of each bite of food. This slows down eating and helps you feel full sooner. For this reason, it can be an important part of a weight loss plan. Find a podcast or recording that provides guidance for breathing awareness, body scan, or meditation exercises. You can listen to these any time when you have a free moment to rest without distractions. Follow your treatment plan as told by your health care provider. This may include taking regular medicines and making changes to your diet or lifestyle as recommended. Where to find more information You can find more information about MBSR from: Your health care provider. Community-based meditation centers or programs. Programs offered near you. Summary Mindfulness-based stress reduction (MBSR) is a program that teaches you how to consciously pay attention to the present moment. It is used to help you deal better with daily stress, feelings, and pain. MBSR focuses on developing self-awareness, which allows you to respond to life stress without judgment or negative feelings. MBSR programs may involve learning different mindfulness practices, such as breathing exercises, meditation, yoga, body scan, or mindful eating. Find a mindfulness practice that works best for you, and set aside  time for it on a regular basis. This information is not intended to replace advice given to you by your health care provider. Make sure you discuss any questions you have with your health care provider. Document Revised: 09/11/2020 Document Reviewed: 09/11/2020 Elsevier Patient Education  [place]. Exercise Information for  Aging Adults Staying physically active is important as you age. Physical activity and exercise can help in maintaining quality of life, health, physical function, and reducing falls. The four types of exercises that are best for older adults are endurance, strength, balance, and flexibility. Contact your health care provider before you start any exercise routine. Ask your health care provider what activities are safe for you. What are the risks? Risks associated with exercising include: Overdoing it. This may lead to sore muscles or fatigue. Falls. Injuries. Dehydration. How to do these exercises Endurance exercises Endurance (aerobic) exercises raise your breathing rate and heart rate. Increasing your endurance helps you do everyday tasks and stay healthy. By improving the health of your body system that includes your heart, lungs, and blood vessels (circulatory system), you may also delay or prevent diseases such as heart disease, diabetes, and weak bones (osteoporosis). Types of endurance exercises include: Sports. Indoor activities, such as using gym equipment, doing water aerobics, or dancing. Outdoor activities, such as biking or jogging. Tasks around the house, such as gardening, yard work, and heavy household chores like cleaning. Walking, such as hiking or walking around your neighborhood. When doing endurance exercises, make sure you: Are aware of your surroundings. Use safety equipment as directed. Dress in layers when exercising outdoors. Drink plenty of water to stay well hydrated. Build up endurance slowly. Start with 10 minutes at a time, and  gradually build up to doing 30 minutes at a time. Unless your health care provider gave you different instructions, aim to exercise for a total of 150 minutes a week. Spread out that time so you are working on endurance 3 or more days a week. Strength exercises Lifting, pulling, or pushing weights helps to strengthen muscles. Having stronger muscles makes it easier to do everyday activities, such as getting up from a chair, climbing stairs, carrying groceries, and playing with grandchildren. Strength exercises include arm and leg exercises that may be done: With weights. Without weights (using your own body weight). With a resistance band. When doing strength exercises: Move smoothly and steadily. Do not suddenly thrust or jerk the weights, the resistance band, or your body. Start with no weights or with light weights, and gradually add more weight over time. Eventually, aim to use weights that are hard or very hard for you to lift. This means that you are able to do 8 repetitions with the weight, and the last few repetitions are very challenging. Lift or push weights into position for 3 seconds, hold the position for 1 second, and then take 3 seconds to return to your starting position. Breathe out (exhale) during difficult movements, like lifting or pushing weights. Breathe in (inhale) to relax your muscles before the next repetition. Consider alternating arms or legs, especially when you first start strength exercises. Expect some slight muscle soreness after each session. Do strength exercises on 2 or more days a week, for 30 minutes at a time. Avoid exercising the same muscle groups two days in a row. For example, if you work on your leg muscles one day, work on your arm muscles the next day. When you can do two sets of 10-15 repetitions with a certain weight, increase the amount of weight. Balance exercises Balance exercises can help to prevent falls. Balance exercises include: Standing on one  foot. Heel-to-toe walk. Balance walk. Tai chi. Make sure you have something sturdy to hold onto while doing balance exercises, such as a sturdy chair. As your balance improves, challenge  yourself by holding on to the chair with one hand instead of two, and then with no hands. Trying exercises with your eyes closed also challenges your balance, but be sure to have a sturdy surface (like a countertop) close by in case you need it. Do balance exercises as often as you want, or as often as directed by your health care provider. Flexibility exercises  Flexibility exercises improve how far you can bend, straighten, move, or rotate parts of your body (range of motion). These exercises also help you do everyday activities such as getting dressed or reaching for objects. Flexibility exercises include stretching different parts of the body, and they may be done in a standing or seated position or on the floor. When stretching, make sure you: Keep a slight bend in your arms and legs. Avoid completely straightening ("locking") your joints. Do not stretch so far that you feel pain. You should feel a mild stretching feeling. You may try stretching farther as you become more flexible over time. Relax and breathe between stretches. Hold on to something sturdy for balance as needed. Hold each stretch for 10-30 seconds. Repeat each stretch 3-5 times. General safety tips Exercise in well-lit areas. Do not hold your breath during exercises or stretches. Warm up before exercising, and cool down after exercising. This can help prevent injury. Drink plenty of water during exercise or any activity that makes you sweat. If you are not sure if an exercise is safe for you, or you are not sure how to do an exercise, talk with your health care provider. This is especially important if you have had surgery on muscles, bones, or joints (orthopedic surgery). Where to find more information You can find more information about  exercise for older adults from: Your local health department, fitness center, or community center. These facilities may have programs for aging adults. [employer] on Aging: http://kim-miller.com/ National Council on Aging: www.ncoa.org Summary Staying physically active is important as you age. Doing endurance, strength, balance, and flexibility exercises can help in maintaining quality of life, health, physical function, and reducing falls. Make sure to contact your health care provider before you start any exercise routine. Ask your health care provider what activities are safe for you. This information is not intended to replace advice given to you by your health care provider. Make sure you discuss any questions you have with your health care provider. Document Revised: 06/16/2020 Document Reviewed: 06/16/2020 Elsevier Patient Education  [place].

## 2022-05-05 NOTE — Plan of Care (Signed)
Chronic Care Management Provider Comprehensive Care Plan    05/05/2022 Name: Tami Jones MRN: [id number] DOB: 04/11/1955  Referral to Chronic Care Management (CCM) services was placed by Provider:  Dr. Silvio Pate on Date: 03-29-2022.  Chronic Condition 1: History of CVA with R sided hemiparesis  Provider Assessment and Plan Discussed exercise--going back to the gym Walks with cane On BP meds, clopidogrel 75, atorvastatin 80   Expected Outcome/Goals Addressed This Visit (Provider CCM goals/Provider Assessment and plan   CCM Expected Outcome:  Monitor, Self-Manage and Reduce Symptoms of  CVA with R Sided hemiparesis    Symptom Management Condition 1: Take all medications as prescribed Attend all scheduled provider appointments Call provider office for new concerns or questions  call the Suicide and Crisis Lifeline: 988 call the [place] National Suicide Prevention Lifeline: 406-722-2134 or TTY: (512) 471-2634 TTY 819-496-9901) to talk to a trained counselor call 1-800-273-TALK (toll free, 24 hour hotline) go to [facility] [address], [place] [phone]) if experiencing a Mental Health or [place]   Chronic Condition 2: DM Provider Assessment and Plan Worse now Couldn't afford farxiga Will increase the glipizide to 5 bid   Expected Outcome/Goals Addressed This Visit (Provider CCM goals/Provider Assessment and plan   CCM (DIABETES) EXPECTED OUTCOME: MONITOR, SELF-MANAGE AND REDUCE SYMPTOMS OF DIABETES   Symptom Management Condition 2: Take all medications as prescribed Attend all scheduled provider appointments Call provider office for new concerns or questions  call the Suicide and Crisis Lifeline: 988 call the [place] National Suicide Prevention Lifeline: 814-544-5875 or TTY: 651 308 9871 TTY 820-300-3170) to talk to a trained counselor call 1-800-273-TALK (toll free, 24 hour hotline) go to [facility] [address], [place] [phone]) if experiencing a Mental Health or [place]  keep appointment with eye doctor check feet daily for cuts, sores or redness trim toenails straight across manage portion size wash and dry feet carefully every day wear comfortable, cotton socks wear comfortable, well-fitting shoes  Chronic Condition 3: CKD Provider Assessment and Plan Is on lisinopril 10    Expected Outcome/Goals Addressed This Visit (Provider CCM goals/Provider Assessment and plan   CCM (KIDNEY FAILURE)  EXPECTED OUTCOME:  MONITOR, SELF-MANAGE AND REDUCE SYMPTOMS OF KIDNEY FAILURE  Symptom Management Condition 3: Take all medications as prescribed Attend all scheduled provider appointments Call provider office for new concerns or questions  call the Suicide and Crisis Lifeline: 988 call the [place] National Suicide Prevention Lifeline: (435)237-7763 or TTY: (830)126-3917 TTY 606 643 3626) to talk to a trained counselor call 1-800-273-TALK (toll free, 24 hour hotline) go to [facility] [address] [place] [phone]) if experiencing a Mental Health or [place] Crisis   Chronic Condition 4: depression, anxiety, stress (mood disorder) Provider Assessment and Plan Anxiety with panic better----rare alprazolam Chronic mostly reactive depression----advised getting out-----gym, etc Has not done well with miultiple meds   Expected Outcome/Goals Addressed This Visit (Provider CCM goals/Provider Assessment and plan   CCM Expected Outcome:  Monitor, Self-Manage and Reduce Symptoms of  depression, anxiety, and stress (mood disorder)   Symptom Management Condition 4: Take all medications as prescribed Attend all scheduled provider appointments Call provider office for new concerns or questions  call the Suicide and Crisis Lifeline: 988 call the [place] National Suicide Prevention Lifeline:  613-109-7671 or TTY: 986-087-6507 TTY 5107588369) to talk to a trained counselor call 1-800-273-TALK (toll free, 24 hour hotline) go to [facility] [address], [place] [phone]) if experiencing a Mental Health or Behavioral Health Crisis   Problem List Patient Active Problem List   Diagnosis Date Noted   TIA (transient ischemic attack) 03/25/2022   Asthma, chronic, unspecified asthma severity, with acute exacerbation 03/25/2022   RUQ pain 12/28/2021   Morbid obesity ([place]) 12/28/2021   Seizure disorder ([place]) 12/28/2021   Urinary frequency 11/27/2019   History of CVA with residual deficit 08/25/2019   Anxiety    Dyslipidemia    Controlled type 2 diabetes mellitus without complication, without long-term current use of insulin ([place])    CKD (chronic kidney disease), stage IIIa    Neuropathic pain, arm 10/26/2018   Hemiparesis of right dominant side ([place]) 08/17/2018   Cerebrovascular disease 07/17/2014   Preventative health care 06/25/2014   Right sided sciatica 03/08/2013   MENOPAUSAL SYNDROME 09/10/2008   Mood disorder ([place]) 09/15/2007   Type 2 diabetes mellitus with other circulatory complications ([place]) [id number]   HYPERCHOLESTEROLEMIA 06/17/2006   GERD 06/17/2006    Medication Management  Current Outpatient Medications:    acetaminophen (TYLENOL) 325 MG tablet, Take 650 mg by mouth every 6 (six) hours as needed., Disp: , Rfl:    albuterol (VENTOLIN HFA) 108 (90 Base) MCG/ACT inhaler, Inhale 2 puffs into the lungs every 6 (six) hours as needed for wheezing or shortness of breath. (Patient not taking: Reported on 03/25/2022), Disp: 8 g, Rfl: 2   ALPRAZolam (XANAX) 0.25 MG tablet, Take 1 tablet (0.25 mg total) by mouth 3 (three) times daily as needed for anxiety or sleep., Disp: 30 tablet, Rfl: 0   amLODipine (NORVASC) 10 MG tablet, Take 1 tablet (10 mg total) by mouth every evening., Disp: 90 tablet, Rfl: 3   aspirin EC 81 MG  tablet, Take 81 mg by mouth every evening. , Disp: , Rfl:    atorvastatin (LIPITOR) 80 MG tablet, Take 1 tablet (80 mg total) by mouth every evening., Disp: 90 tablet, Rfl: 3   clopidogrel (PLAVIX) 75 MG tablet, TAKE 1 TABLET BY MOUTH EVERY DAY, Disp: 90 tablet, Rfl: 3   glipiZIDE (GLUCOTROL XL) 5 MG 24 hr tablet, Take 1 tablet (5 mg total) by mouth in the morning and at bedtime., Disp: 180 tablet, Rfl: 3   metoprolol succinate (TOPROL-XL) 25 MG 24 hr tablet, TAKE 1 TABLET BY MOUTH EVERY DAY, Disp: 90 tablet, Rfl: 0   pantoprazole (PROTONIX) 40 MG tablet, Take 1 tablet (40 mg total) by mouth daily., Disp: 90 tablet, Rfl: 3  Cognitive Assessment Identity Confirmed: : Name; DOB Cognitive Status: Normal   Functional Assessment Hearing Difficulty or Deaf: no Wear Glasses or Blind: yes Vision Management: wears glasses- will have cataract removed in a couple of months Concentrating, Remembering or Making Decisions Difficulty (CP): no Difficulty Communicating: no Difficulty Eating/Swallowing: no Walking or Climbing Stairs Difficulty: yes Walking or Climbing Stairs: ambulation difficulty, requires equipment Mobility Management: uses a cane when ambulating Dressing/Bathing Difficulty: no Doing Errands Independently Difficulty (such as shopping) (CP): yes Errands Management: daughter assist as needed, the patient does drive short distances   Health and safety inspector Source of Support/Comfort: child(ren) Name of Support/Comfort Primary Source: [name]- daughter People in Home: child(ren), adult Family Caregiver if Needed: child(ren), adult Family Caregiver Names: daughter- [name] Primary Roles/Responsibilities: disabled   Planned Interventions  Evaluation of current treatment plan related to history of CVA with right sided weakness and elevations of blood pressures and patient's adherence to plan as established by provider Provided education to  patient re: monitoring for changes  in blood pressures and calling the provider for blood pressures consistently greater than [id number] systolic and [id number] diastolic. The patient states her vision changes when her blood pressures are high. Encouraged her to get follow up with pcp for evaluation and recommendations. Reviewed medications with patient and discussed compliance. Pharm D consult for medication reconciliation and management  Collaborated with pcp regarding patients stated blood pressures  Provided patient with blood pressure and resources to help with managing blood pressures educational materials related to past history of CVA and elevated blood pressures Reviewed scheduled/upcoming provider appointments including encouraged the patient to call the office and get a follow up appointment with the pcp Discussed plans with patient for ongoing care management follow up and provided patient with direct contact information for care management team Advised patient to discuss changes in her blood pressures, sx and sx of TIA, new concerns related to CVA with provider Screening for signs and symptoms of depression related to chronic disease state  Assessed social determinant of health barriers Assessed the patient     understanding of chronic kidney disease    Evaluation of current treatment plan related to chronic kidney disease self management and patient's adherence to plan as established by provider      Provided education to patient re: stroke prevention, s/s of heart attack and stroke    Reviewed prescribed diet heart healthy/ADA Reviewed medications with patient and discussed importance of compliance    Advised patient, providing education and rationale, to monitor blood pressure daily and record, calling PCP for findings outside established parameters    Discussed complications of poorly controlled blood pressure such as heart disease, stroke, circulatory complications, vision complications, kidney impairment, sexual dysfunction     Advised patient to discuss changes in CKD or kidney function with provider    Discussed plans with patient for ongoing care management follow up and provided patient with direct contact information for care management team    Screening for signs and symptoms of depression related to chronic disease state      Discussed the impact of chronic kidney disease on daily life and mental health and acknowledged and normalized feelings of disempowerment, fear, and frustration    Assessed social determinant of health barriers    Provided education on kidney disease progression    Engage patient in early, proactive and ongoing discussion about goals of care and what matters most to them    Support coping and stress management by recognizing current strategies and assist in developing new strategies such as mindfulness, journaling, relaxation techniques, problem-solving     Evaluation of current treatment plan related to stress, depression, and anxiety and patient's adherence to plan as established by provider Advised patient to call the office for changes in mood, anxiety, depression, stress, or acute mental health needs.  Provided education to patient re: resources available to help with dealing with stress and anxiety. Will attach information in my chart for patient to review and give support  Reviewed medications with patient and discussed compliance. The patient states compliance with medications.  Collaborated with pcp regarding outreach today and encouraging the patient to get an appointment to come in and see the provider Reviewed scheduled/upcoming provider appointments including no upcoming appointment with pcp. The patient has not seen pcp since 12-28-2021. Encouraged her to call and get an appointment for follow up especially since she has had a hospital stay and positive for [place] referral for resources to help with  SDOH needs- financial strain, utility cost, and resources in her area.   Social Work referral for help and support with depression, anxiety, and stress Pharmacy referral for help with medication cost for medication for help with management of DM Discussed plans with patient for ongoing care management follow up and provided patient with direct contact information for care management team Advised patient to discuss changes in her mood, depression, anxiety, stress, or mental health needs with provider Screening for signs and symptoms of depression related to chronic disease state  Assessed social determinant of health barriers Provided education to patient about basic DM disease process; Reviewed medications with patient and discussed importance of medication adherence;        Reviewed prescribed diet with patient heart healthy/ADA diet ; Counseled on importance of regular laboratory monitoring as prescribed;        Discussed plans with patient for ongoing care management follow up and provided patient with direct contact information for care management team;      Provided patient with written educational materials related to hypo and hyperglycemia and importance of correct treatment;       Reviewed scheduled/upcoming provider appointments including: encouraged the patient to call and get an appointment with the pcp for follow up ;         Advised patient, providing education and rationale, to check cbg twice daily and when you have symptoms of low or high blood sugar and record        call provider for findings outside established parameters;       Referral made to pharmacy team for assistance with medication cost needs. The patient states she cannot afford the medication the pcp wants her to take for DM;       Review of patient status, including review of consultants reports, relevant laboratory and other test results, and medications completed;       Advised patient to discuss changes in her DM, questions, or concerns with provider;      Screening for signs and  symptoms of depression related to chronic disease state;        Assessed social determinant of health barriers;             Interaction and coordination with outside resources, practitioners, and providers See CCM Referral  Care Plan: Available in MyChart

## 2022-05-06 NOTE — Progress Notes (Signed)
  Care Coordination  Outreach Note  05/06/2022 Name: Tami Jones MRN: [id number] DOB: 1955-11-21   Care Coordination Outreach Attempts: A second unsuccessful outreach was attempted today to offer the patient with information about available care coordination services as a benefit of their health plan.     Follow Up Plan:  Additional outreach attempts will be made to offer the patient care coordination information and services.   Encounter Outcome:  No Answer  [place]  Direct Dial: [phone]

## 2022-05-11 ENCOUNTER — Telehealth: Payer: Self-pay | Admitting: *Deleted

## 2022-05-11 NOTE — Progress Notes (Signed)
  Care Coordination  Outreach Note  05/11/2022 Name: Tami Jones MRN: [id number] DOB: 1955-09-03   Care Coordination Outreach Attempts: A third unsuccessful outreach was attempted today to offer the patient with information about available care coordination services as a benefit of their health plan.   Follow Up Plan:  No further outreach attempts will be made at this time. We have been unable to contact the patient to offer or enroll patient in care coordination services  Encounter Outcome:  No Answer  [place]: [phone]

## 2022-05-11 NOTE — Telephone Encounter (Signed)
   Telephone encounter was:  Unsuccessful.  05/11/2022 Name: Tami Jones MRN: [id number] DOB: 08-09-1955  Unsuccessful outbound call made today to assist with:  Food Insecurity  Outreach Attempt:  1st Attempt  Voicemail full.  [place] [phone] [address] , [place] [address] Email : [email or web address]

## 2022-05-16 DIAGNOSIS — F32A Depression, unspecified: Secondary | ICD-10-CM | POA: Diagnosis not present

## 2022-05-16 DIAGNOSIS — Z7984 Long term (current) use of oral hypoglycemic drugs: Secondary | ICD-10-CM

## 2022-05-16 DIAGNOSIS — E1122 Type 2 diabetes mellitus with diabetic chronic kidney disease: Secondary | ICD-10-CM

## 2022-05-16 DIAGNOSIS — N1831 Chronic kidney disease, stage 3a: Secondary | ICD-10-CM

## 2022-05-16 DIAGNOSIS — R69 Illness, unspecified: Secondary | ICD-10-CM | POA: Diagnosis not present

## 2022-05-17 ENCOUNTER — Telehealth: Payer: Self-pay | Admitting: *Deleted

## 2022-05-17 NOTE — Telephone Encounter (Signed)
   Telephone encounter was:  Unsuccessful.  05/17/2022 Name: APRYL LAZARIN MRN: [id number] DOB: Jul 03, 1955  Unsuccessful outbound call made today to assist with:  Food Insecurity  Outreach Attempt:  2nd Attempt  mailbox is full   [place] [address] , [place] [address] Email : Ashby [email or web address]. [email or web address]

## 2022-05-18 ENCOUNTER — Telehealth: Payer: Self-pay | Admitting: *Deleted

## 2022-05-18 NOTE — Telephone Encounter (Signed)
   Telephone encounter was:  Unsuccessful.  05/18/2022 Name: DELAIAH PAM MRN: [id number] DOB: [birth year]  Unsuccessful outbound call made today to assist with:  Food Insecurity  Outreach Attempt:  3rd Attempt.  Referral closed unable to contact patient. voicemail is full   [place] [phone] [address] , [place] [address] Email : Ashby [email or web address]

## 2022-05-20 ENCOUNTER — Telehealth: Payer: Self-pay | Admitting: *Deleted

## 2022-05-20 NOTE — Telephone Encounter (Signed)
   Telephone encounter was:  Unsuccessful.  05/20/2022 Name: Tami Jones MRN: [id number] DOB: 07-Apr-1955  Unsuccessful outbound call made today to assist with:  Food Insecurity  Outreach Attempt:  3rd Attempt.  Referral closed unable to contact patient.  Mailbox is still full no message left  [place] [phone] [address] , [place] [address] Email : [email or web address] [email or web address]

## 2022-05-24 ENCOUNTER — Encounter: Payer: Self-pay | Admitting: Internal Medicine

## 2022-05-24 ENCOUNTER — Ambulatory Visit (INDEPENDENT_AMBULATORY_CARE_PROVIDER_SITE_OTHER): Payer: Medicare HMO | Admitting: Internal Medicine

## 2022-05-24 VITALS — BP 136/88 | HR 66 | Temp 97.5°F | Ht 63.0 in | Wt 235.0 lb

## 2022-05-24 DIAGNOSIS — I679 Cerebrovascular disease, unspecified: Secondary | ICD-10-CM | POA: Diagnosis not present

## 2022-05-24 DIAGNOSIS — G9332 Myalgic encephalomyelitis/chronic fatigue syndrome: Secondary | ICD-10-CM | POA: Diagnosis not present

## 2022-05-24 DIAGNOSIS — U099 Post covid-19 condition, unspecified: Secondary | ICD-10-CM | POA: Diagnosis not present

## 2022-05-24 DIAGNOSIS — I10 Essential (primary) hypertension: Secondary | ICD-10-CM

## 2022-05-24 DIAGNOSIS — E1159 Type 2 diabetes mellitus with other circulatory complications: Secondary | ICD-10-CM | POA: Diagnosis not present

## 2022-05-24 LAB — POCT GLYCOSYLATED HEMOGLOBIN (HGB A1C): Hemoglobin A1C: 6.9 % — AB (ref 4.0–5.6)

## 2022-05-24 MED ORDER — METOPROLOL SUCCINATE ER 25 MG PO TB24: 25.0000 mg | ORAL_TABLET | Freq: Every day | ORAL | 3 refills | Status: DC

## 2022-05-24 NOTE — Progress Notes (Signed)
Subjective:    Patient ID: Tami Jones, female    DOB: 09/08/1955, 67 y.o.   MRN: [id number]  HPI Here for hospital follow up---delayed after COVID, etc  Had stroke like symptoms upon awakening Daughter noted slurred speech and trouble speaking Daughter called 911--BP was elevated Symptoms had improved by the time she got there--or in ER Wondered if it could have been panic--doesn't remember if a dream Admitted briefly Imaging  (MRI/CT angio) didn't show new stroke Echo was okay No recurrent episodes in the past 2 months  Had bad time with COVID infection Soon after getting home from hospital Just did supportive care--bedrest, analgesics  Some elevated BP in the evenings--not consistent Some chest pain--relates to chest cold/coughing Still some substernal discomfort--relates to sinus drainage (not coughing much anymore) No dizziness but feels lightheaded at times  Checking sugars sporadically 103 yesterday--hadn't eaten much Some high levels at night--200's No low sugar reactions  GFR 57-60  Current Outpatient Medications on File Prior to Visit  Medication Sig Dispense Refill   acetaminophen (TYLENOL) 325 MG tablet Take 650 mg by mouth every 6 (six) hours as needed.     albuterol (VENTOLIN HFA) 108 (90 Base) MCG/ACT inhaler Inhale 2 puffs into the lungs every 6 (six) hours as needed for wheezing or shortness of breath. 8 g 2   ALPRAZolam (XANAX) 0.25 MG tablet Take 1 tablet (0.25 mg total) by mouth 3 (three) times daily as needed for anxiety or sleep. 30 tablet 0   amLODipine (NORVASC) 10 MG tablet Take 1 tablet (10 mg total) by mouth every evening. 90 tablet 3   aspirin EC 81 MG tablet Take 81 mg by mouth every evening.      atorvastatin (LIPITOR) 80 MG tablet Take 1 tablet (80 mg total) by mouth every evening. 90 tablet 3   clopidogrel (PLAVIX) 75 MG tablet TAKE 1 TABLET BY MOUTH EVERY DAY 90 tablet 3   glipiZIDE (GLUCOTROL XL) 5 MG 24 hr tablet Take 1 tablet (5 mg  total) by mouth in the morning and at bedtime. 180 tablet 3   metoprolol succinate (TOPROL-XL) 25 MG 24 hr tablet TAKE 1 TABLET BY MOUTH EVERY DAY 90 tablet 0   pantoprazole (PROTONIX) 40 MG tablet Take 1 tablet (40 mg total) by mouth daily. 90 tablet 3   No current facility-administered medications on file prior to visit.    Allergies  Allergen Reactions   Citalopram Other (See Comments)    Feels odd   Doxycycline Hyclate Nausea And Vomiting   Erythromycin Other (See Comments)    Irritated stomach   Montelukast Sodium Itching   Nitrofurantoin Nausea And Vomiting   Sulfa Antibiotics Nausea And Vomiting   Tramadol Hcl Other (See Comments)    Feels odd   Venlafaxine Hcl Other (See Comments)    Feels odd   Amoxicillin-Pot Clavulanate Nausea And Vomiting   Clarithromycin Rash     See ER note 02/2017    Past Medical History:  Diagnosis Date   Anxiety    Asthma    CVA (cerebral vascular accident) 03/2006   right occipital, Dr. Thad Rangereynolds   Diabetes mellitus    Expressive dysphasia 03/25/2022   GERD (gastroesophageal reflux disease)    Hyperlipidemia    Hypertension    Hypertensive urgency 03/25/2022    Past Surgical History:  Procedure Laterality Date   CARDIOVASCULAR STRESS TEST  1/15   myoview. EF 60%   CESAREAN SECTION     ESOPHAGOGASTRODUODENOSCOPY  05/2005  TONSILLECTOMY AND ADENOIDECTOMY      Family History  Problem Relation Age of Onset   Coronary artery disease Mother    Diabetes Mellitus II Mother    Heart attack Mother 85   Diabetes Mellitus II Maternal Grandmother     Social History   Socioeconomic History   Marital status: Divorced    Spouse name: Not on file   Number of children: 2   Years of education: Not on file   Highest education level: Not on file  Occupational History   Occupation: LAB TECH    Employer: [employer]  Tobacco Use   Smoking status: Never    Passive exposure: Never   Smokeless tobacco: Never  Vaping Use   Vaping Use: Never  used  Substance and Sexual Activity   Alcohol use: No    Alcohol/week: 0.0 standard drinks of alcohol    Comment: wine occassionally   Drug use: No   Sexual activity: Yes    Birth control/protection: Condom  Other Topics Concern   Not on file  Social History Narrative   No living will   Daughter should be health care decision maker   Would accept resuscitation   No tube feeds if cognitively aware   Social Determinants of Health   Financial Resource Strain: High Risk (05/05/2022)   Overall Financial Resource Strain (CARDIA)    Difficulty of Paying Living Expenses: Hard  Food Insecurity: No Food Insecurity (05/05/2022)   Hunger Vital Sign    Worried About Running Out of Food in the Last Year: Never true    Ran Out of Food in the Last Year: Never true  Transportation Needs: No Transportation Needs (05/05/2022)   PRAPARE - Administrator, Civil Service (Medical): No    Lack of Transportation (Non-Medical): No  Physical Activity: Insufficiently Active (05/05/2022)   Exercise Vital Sign    Days of Exercise per Week: 4 days    Minutes of Exercise per Session: 20 min  Stress: Stress Concern Present (05/05/2022)   [employer] of Occupational Health - Occupational Stress Questionnaire    Feeling of Stress : To some extent  Social Connections: Moderately Isolated (05/05/2022)   Social Connection and Isolation Panel [NHANES]    Frequency of Communication with Friends and Family: More than three times a week    Frequency of Social Gatherings with Friends and Family: More than three times a week    Attends Religious Services: 1 to 4 times per year    Active Member of [employer] or Organizations: No    Attends Banker Meetings: Never    Marital Status: Divorced  Catering manager Violence: Not At Risk (05/05/2022)   Humiliation, Afraid, Rape, and Kick questionnaire    Fear of Current or Ex-Partner: No    Emotionally Abused: No    Physically Abused: No    Sexually  Abused: No   Review of Systems Not eating that great Weight is stable or down slightly Some trouble initiating sleep---does okay once she starts Same depression---low level and chronic     Objective:   Physical Exam Constitutional:      Appearance: Normal appearance.  Cardiovascular:     Rate and Rhythm: Normal rate and regular rhythm.     Pulses: Normal pulses.     Heart sounds: No murmur heard.    No gallop.  Pulmonary:     Effort: Pulmonary effort is normal.     Breath sounds: Normal breath sounds. No wheezing or rales.  Musculoskeletal:     Cervical back: Neck supple.  Lymphadenopathy:     Cervical: No cervical adenopathy.  Neurological:     Mental Status: She is alert.  Psychiatric:     Comments: Usual mild depressed mood No suicidal ideation            Assessment & Plan:

## 2022-05-24 NOTE — Assessment & Plan Note (Signed)
Had symptoms consistent with TIA Brief hospitalization Now back home and same regimen--ASA, plavix, atorvastatin 80

## 2022-05-24 NOTE — Assessment & Plan Note (Signed)
BP Readings from Last 3 Encounters:  05/24/22 136/88  03/26/22 (!) 180/84  01/13/22 (!) 225/107   Good control on amlodipine 10 and metoprolol 25 daily

## 2022-05-24 NOTE — Assessment & Plan Note (Signed)
And mild cough, etc Finally starting to improve Chest is clear---reassured her

## 2022-05-24 NOTE — Assessment & Plan Note (Addendum)
Variable control On the glipizide 5mg  still Will check A1c Lab Results  Component Value Date   HGBA1C 6.9 (A) 05/24/2022   Still acceptable No medication changes needed

## 2022-05-25 ENCOUNTER — Telehealth: Payer: Self-pay

## 2022-05-25 NOTE — Progress Notes (Signed)
   Care Guide Note  05/25/2022 Name: MOON FULLILOVE MRN: [id number] DOB: [birth year]  Referred by: Karie Schwalbe, MD Reason for referral : Chronic Care Management (Outreach to schedule with Pharm d referral from [facility])   Teletha Mcbath Marchant is a 67 y.o. year old female who is a primary care patient of Karie Schwalbe, MD. Duke Salvia Harbach was referred to the pharmacist for assistance related to HTN and DM.    An unsuccessful telephone outreach was attempted today to contact the patient who was referred to the pharmacy team for assistance with medication management. Additional attempts will be made to contact the patient.   Penne Lash, RMA Care Guide [facility]  [place], [place] [address] Direct Dial: [phone] [email or web address]

## 2022-06-08 ENCOUNTER — Telehealth: Payer: Medicare HMO

## 2022-06-08 ENCOUNTER — Telehealth: Payer: Self-pay

## 2022-06-08 NOTE — Telephone Encounter (Signed)
   CCM RN Visit Note   06-08-2022 Name: Tami Jones MRN: [id number]      DOB: [birth year]  Subjective: [name] is a 67 y.o. year old female who is a primary care patient of Dr. Alphonsus Sias. The patient was referred to the Chronic Care Management team for assistance with care management needs subsequent to provider initiation of CCM services and plan of care.      An unsuccessful telephone outreach was attempted today to contact the patient about Chronic Care Management needs.    Plan:The care management team will reach out to the patient again over the next 30 days.  Alto Denver RN, MSN, CCM RN Care Manager  Chronic Care Management Direct Number: [phone]

## 2022-06-11 ENCOUNTER — Other Ambulatory Visit: Payer: Self-pay | Admitting: Internal Medicine

## 2022-06-11 ENCOUNTER — Telehealth: Payer: Self-pay | Admitting: Internal Medicine

## 2022-06-11 NOTE — Telephone Encounter (Signed)
Prescription Request  06/11/2022  LOV: 05/24/2022  What is the name of the medication or equipment? One touch glucose test strips   Have you contacted your pharmacy to request a refill? No   Which pharmacy would you like this sent to?  [facility] [address] [place] - [address] [place] [address] Phone: [phone] Fax: [phone]    Patient notified that their request is being sent to the clinical staff for review and that they should receive a response within 2 business days.   Please advise at Mobile [phone] (mobile)  Did not see strips on pt's med list.

## 2022-06-12 MED ORDER — GLUCOSE BLOOD VI STRP: ORAL_STRIP | 3 refills | Status: DC

## 2022-06-12 NOTE — Telephone Encounter (Signed)
Rx done. 

## 2022-06-15 ENCOUNTER — Other Ambulatory Visit: Payer: Self-pay | Admitting: Internal Medicine

## 2022-06-17 ENCOUNTER — Telehealth: Payer: Medicare HMO

## 2022-06-17 ENCOUNTER — Telehealth: Payer: Self-pay

## 2022-06-17 NOTE — Telephone Encounter (Signed)
   CCM RN Visit Note   06-17-2022 Name: BETTE BRIENZA MRN: [id number]      DOB: [birth year]  Subjective: Duke Salvia Lattner is a 67 y.o. year old female who is a primary care patient of Dr. Tillman Abide. The patient was referred to the Chronic Care Management team for assistance with care management needs subsequent to provider initiation of CCM services and plan of care.      Second unsuccessful telephone outreach was attempted today to contact the patient about Chronic Care Management needs.    Plan:The care management team will reach out to the patient again over the next 30 days.  Alto Denver RN, MSN, CCM RN Care Manager  Chronic Care Management Direct Number: [phone]

## 2022-06-22 NOTE — Progress Notes (Signed)
  Chronic Care Management Note  06/22/2022 Name: MEKEBA ELEDGE MRN: [id number] DOB: May 23, 1955  Duke Salvia Magwood is a 67 y.o. year old female who is a primary care patient of Tillman Abide I, MD and is actively engaged with the Chronic Care Management team. I reached out to Duke Salvia Lowy by phone today to assist with re-scheduling a follow up visit with the RN Case Manager  Follow up plan: Unsuccessful telephone outreach attempt made. The care management team will reach out to the patient again over the next 7 days.  If patient returns call to provider office, please advise to call CCM Care Guide Penne Lash  at [phone]  Penne Lash, RMA Care Guide [facility]  [place], [place] [address] Direct Dial: [phone] [email or web address]

## 2022-06-28 NOTE — Progress Notes (Signed)
  Chronic Care Management Note  06/28/2022 Name: Tami Jones MRN: [id number] DOB: [birth year]  Tami Salvia Agent is a 67 y.o. year old female who is a primary care patient of Tillman Abide I, MD and is actively engaged with the Chronic Care Management team. I reached out to Tami Jones by phone today to assist with re-scheduling a follow up visit with the RN Case Manager  Follow up plan: Unable to make contact on outreach attempts x 3. PCP  Tillman Abide notified via routed documentation in medical record.   Penne Lash, RMA Care Guide [facility]  [place], [place] [address] Direct Dial: [phone] [email or web address]

## 2022-07-14 ENCOUNTER — Ambulatory Visit
Admission: EM | Admit: 2022-07-14 | Discharge: 2022-07-14 | Disposition: A | Discharge status: Home / Self Care | Payer: Medicare HMO | Attending: Urgent Care | Admitting: Urgent Care

## 2022-07-14 DIAGNOSIS — R300 Dysuria: Secondary | ICD-10-CM | POA: Insufficient documentation

## 2022-07-14 LAB — POCT URINALYSIS DIP (MANUAL ENTRY)
Bilirubin, UA: NEGATIVE
Glucose, UA: 250 mg/dL — AB
Ketones, POC UA: NEGATIVE mg/dL
Nitrite, UA: POSITIVE — AB
Protein Ur, POC: >=300 mg/dL — AB
Spec Grav, UA: >=1.03 — AB (ref 1.010–1.025)
Urobilinogen, UA: 0.2 E.U./dL
pH, UA: 6 (ref 5.0–8.0)

## 2022-07-14 MED ORDER — CEPHALEXIN 500 MG PO CAPS: 500.0000 mg | ORAL_CAPSULE | Freq: Four times a day (QID) | ORAL | 0 refills | Status: AC

## 2022-07-14 NOTE — ED Provider Notes (Signed)
Tami Jones    CSN: [id number] Arrival date & time: 07/14/22  1916      History   Chief Complaint Chief Complaint  Patient presents with   Dysuria   Urinary Frequency   Abdominal Pain    HPI Tami Jones is a 67 y.o. female.    Dysuria Associated symptoms: abdominal pain   Urinary Frequency Associated symptoms include abdominal pain.  Abdominal Pain Associated symptoms: dysuria     Patient presents to urgent care with complaint of UTI symptoms x 1 day.  She reports dysuria, abdominal pain, frequency.  Past Medical History:  Diagnosis Date   Anxiety    Asthma    CVA (cerebral vascular accident) (HCC) 03/2006   right occipital, Dr. 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   TIA (transient ischemic attack) 03/25/2022   Asthma, chronic, unspecified asthma severity, with acute exacerbation 03/25/2022   RUQ pain 12/28/2021   Morbid obesity (HCC) 12/28/2021   Seizure disorder (HCC) 12/28/2021   Urinary frequency 11/27/2019   History of CVA with residual deficit 08/25/2019   Anxiety    Dyslipidemia    CKD (chronic kidney disease), stage IIIa    Neuropathic pain, arm 10/26/2018   Hemiparesis of right dominant side (HCC) 08/17/2018   Cerebrovascular disease 07/17/2014   Preventative health care 06/25/2014   Right sided sciatica 03/08/2013   MENOPAUSAL SYNDROME 09/10/2008   Mood disorder (HCC) 09/15/2007   Essential hypertension, benign 08/10/2006   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 EF 60%   CESAREAN SECTION      ESOPHAGOGASTRODUODENOSCOPY  05/2005   TONSILLECTOMY AND ADENOIDECTOMY      OB History     Gravida  2   Para  2   Term  2   Preterm  0   AB  0   Living  2      SAB  0   IAB  0   Ectopic  0   Multiple  0   Live Births               Home Medications    Prior to Admission medications   Medication Sig Start Date End Date Taking? Authorizing Provider  acetaminophen (TYLENOL) 325 MG tablet Take 650 mg by mouth every 6 (six) hours as needed.    [provider]  albuterol (VENTOLIN HFA) 108 (90 Base) MCG/ACT inhaler Inhale 2 puffs into the lungs every 6 (six) hours as needed for wheezing or shortness of breath. 12/18/20   Phineas Semen, MD  ALPRAZolam Prudy Feeler) 0.25 MG tablet Take 1 tablet (0.25 mg total) by mouth 3 (three) times daily as needed for anxiety or sleep. 12/24/20   Karie Schwalbe, MD  amLODipine (NORVASC) 10 MG tablet Take 1 tablet (10 mg total) by mouth every evening. 03/30/22   Karie Schwalbe, MD  aspirin EC 81 MG tablet Take 81 mg by mouth every evening.     [provider]  atorvastatin (LIPITOR) 80 MG tablet TAKE 1 TABLET BY MOUTH EVERY EVENING 06/11/22   Tillman Abide  I, MD  clopidogrel (PLAVIX) 75 MG tablet TAKE 1 TABLET BY MOUTH EVERY DAY 03/22/22   Karie Schwalbe, MD  glipiZIDE (GLUCOTROL XL) 5 MG 24 hr tablet Take 1 tablet (5 mg total) by mouth in the morning and at bedtime. 12/28/21   Tillman Abide I, MD  glucose blood test strip Use daily as instructed 06/12/22   Karie Schwalbe, MD  metoprolol succinate (TOPROL-XL) 25 MG 24 hr tablet Take 1 tablet (25 mg total) by mouth daily. 05/24/22   Karie Schwalbe, MD  pantoprazole (PROTONIX) 40 MG tablet TAKE 1 TABLET BY MOUTH EVERY DAY 06/15/22   Karie Schwalbe, MD    Family History Family History  Problem Relation Age of Onset   Coronary artery disease Mother    Diabetes Mellitus II Mother    Heart attack Mother 18   Diabetes Mellitus II Maternal Grandmother      Social History Social History   Tobacco Use   Smoking status: Never    Passive exposure: Never   Smokeless tobacco: Never  Vaping Use   Vaping Use: Never used  Substance Use Topics   Alcohol use: No    Alcohol/week: 0.0 standard drinks of alcohol    Comment: wine occassionally   Drug use: No     Allergies   Citalopram, Doxycycline hyclate, Erythromycin, Montelukast sodium, Nitrofurantoin, Sulfa antibiotics, Tramadol hcl, Venlafaxine hcl, Amoxicillin-pot clavulanate, and Clarithromycin   Review of Systems Review of Systems  Gastrointestinal:  Positive for abdominal pain.  Genitourinary:  Positive for dysuria and frequency.     Physical Exam Triage Vital Signs ED Triage Vitals [07/14/22 1941]  Enc Vitals Group     BP (S) (!) 207/98     Pulse Rate 82     Resp 18     Temp (!) 97.5 F (36.4 C)     Temp Source Temporal     SpO2 96 %     Weight      Height      Head Circumference      Peak Flow      Pain Score 7     Pain Loc      Pain Edu?      Excl. in GC?    No data found.  Updated Vital Signs BP (S) (!) 207/98 (BP Location: Left Arm)   Pulse 82   Temp (!) 97.5 F (36.4 C) (Temporal)   Resp 18   SpO2 96%   Visual Acuity Right Eye Distance:   Left Eye Distance:   Bilateral Distance:    Right Eye Near:   Left Eye Near:    Bilateral Near:     Physical Exam Vitals reviewed.  Constitutional:      Appearance: She is well-developed.  HENT:     Head: Normocephalic and atraumatic.  Skin:    General: Skin is warm and dry.  Neurological:     Mental Status: She is alert.  Psychiatric:        Mood and Affect: Mood normal.        Behavior: Behavior normal.      UC Treatments / Results  Labs (all labs ordered are listed, but only abnormal results are displayed) Labs Reviewed  POCT URINALYSIS DIP (MANUAL ENTRY)    EKG   Radiology No results found.  Procedures Procedures (including critical care time)  Medications Ordered in  UC Medications - No data to display  Initial Impression / Assessment and Plan / UC Course  I have reviewed the triage vital signs and the nursing notes.  Pertinent labs & imaging results that were available during my care of the patient were reviewed by me and considered in my medical decision making (see chart for details).   UA is not clearly suggestive of urinary tract infection.  Trace leukocytes.  Positive nitrites.  Moderate blood.  When her complaint of dysuria, will treat for presumed UTI and send confirmatory culture.  Counseled patient on potential for adverse effects with medications prescribed/recommended today, ER and return-to-clinic precautions discussed, patient verbalized understanding and agreement with care plan.    Final Clinical Impressions(s) / UC Diagnoses   Final diagnoses:  Dysuria   Discharge Instructions   None    ED Prescriptions   None    PDMP not reviewed this encounter.   Charma Igo, [place] 07/14/22 1946

## 2022-07-14 NOTE — Discharge Instructions (Addendum)
Follow up here or with your primary care provider if your symptoms are worsening or not improving.     

## 2022-07-14 NOTE — ED Triage Notes (Signed)
Patient presents to UC for UTI x 1 day. Reports dysuria, abdominal pain, and urinary freq. Did not take anything for symptom management.

## 2022-07-17 LAB — URINE CULTURE: Culture: >=100000 — AB

## 2022-09-07 ENCOUNTER — Telehealth: Payer: Self-pay

## 2022-09-19 ENCOUNTER — Encounter: Payer: Self-pay | Admitting: *Deleted

## 2022-09-19 ENCOUNTER — Ambulatory Visit
Admission: EM | Admit: 2022-09-19 | Discharge: 2022-09-19 | Disposition: A | Discharge status: Home / Self Care | Payer: Medicare HMO | Attending: Internal Medicine | Admitting: Internal Medicine

## 2022-09-19 ENCOUNTER — Other Ambulatory Visit: Payer: Self-pay

## 2022-09-19 DIAGNOSIS — N3000 Acute cystitis without hematuria: Secondary | ICD-10-CM | POA: Insufficient documentation

## 2022-09-19 LAB — POCT URINALYSIS DIP (MANUAL ENTRY)
Bilirubin, UA: NEGATIVE
Glucose, UA: NEGATIVE mg/dL
Ketones, POC UA: NEGATIVE mg/dL
Nitrite, UA: NEGATIVE
Protein Ur, POC: NEGATIVE mg/dL
Spec Grav, UA: 1.015 (ref 1.010–1.025)
Urobilinogen, UA: 0.2 E.U./dL
pH, UA: 6 (ref 5.0–8.0)

## 2022-09-19 MED ORDER — CEPHALEXIN 500 MG PO CAPS: 500.0000 mg | ORAL_CAPSULE | Freq: Three times a day (TID) | ORAL | 0 refills | Status: DC

## 2022-09-19 MED ORDER — PHENAZOPYRIDINE HCL 200 MG PO TABS: 200.0000 mg | ORAL_TABLET | Freq: Three times a day (TID) | ORAL | 0 refills | Status: DC

## 2022-09-19 NOTE — ED Triage Notes (Signed)
PT reports dysuria and ABD pain that started yesterday.

## 2022-09-19 NOTE — ED Provider Notes (Signed)
[name]    CSN: [id number] Arrival date & time: 09/19/22  8469      History   Chief Complaint Chief Complaint  Patient presents with   Dysuria    HPI Tami Jones is a 67 y.o. female who presents with onset of dysuria and suprapubic pain since yesterday. Denies flank pain, fever or chills. Last UTI 6 months ago.     Past Medical History:  Diagnosis Date   Anxiety    Asthma    CVA (cerebral vascular accident) (HCC) 03/2006   right occipital, Dr. 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   TIA (transient ischemic attack) 03/25/2022   Asthma, chronic, unspecified asthma severity, with acute exacerbation 03/25/2022   RUQ pain 12/28/2021   Morbid obesity (HCC) 12/28/2021   Seizure disorder (HCC) 12/28/2021   Urinary frequency 11/27/2019   History of CVA with residual deficit 08/25/2019   Anxiety    Dyslipidemia    CKD (chronic kidney disease), stage IIIa    Neuropathic pain, arm 10/26/2018   Hemiparesis of right dominant side (HCC) 08/17/2018   Cerebrovascular disease 07/17/2014   Preventative health care 06/25/2014   Right sided sciatica 03/08/2013   MENOPAUSAL SYNDROME 09/10/2008   Mood disorder (HCC) 09/15/2007   Essential hypertension, benign 08/10/2006   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 EF 60%   CESAREAN SECTION     ESOPHAGOGASTRODUODENOSCOPY  05/2005   TONSILLECTOMY AND ADENOIDECTOMY      OB History     Gravida  2   Para  2   Term  2   Preterm  0   AB  0   Living  2      SAB  0   IAB  0   Ectopic   0   Multiple  0   Live Births               Home Medications    Prior to Admission medications   Medication Sig Start Date End Date Taking? Authorizing Provider  acetaminophen (TYLENOL) 325 MG tablet Take 650 mg by mouth every 6 (six) hours as needed.   Yes [provider]  ALPRAZolam (XANAX) 0.25 MG tablet Take 1 tablet (0.25 mg total) by mouth 3 (three) times daily as needed for anxiety or sleep. 12/24/20  Yes Karie Schwalbe, MD  amLODipine (NORVASC) 10 MG tablet Take 1 tablet (10 mg total) by mouth every evening. 03/30/22  Yes Tillman Abide I, MD  aspirin EC 81 MG tablet Take 81 mg by mouth every evening.    Yes [provider]  atorvastatin (LIPITOR) 80 MG tablet TAKE 1 TABLET BY MOUTH EVERY EVENING 06/11/22  Yes Karie Schwalbe, MD  cephALEXin (KEFLEX) 500 MG capsule Take 1 capsule (500 mg total) by mouth 3 (three) times daily. 09/19/22  Yes Rodriguez-Southworth, Nettie Elm, PA-C  clopidogrel (PLAVIX) 75 MG tablet TAKE 1 TABLET BY MOUTH EVERY DAY 03/22/22  Yes Karie Schwalbe, MD  glipiZIDE (GLUCOTROL XL) 5 MG 24 hr tablet Take 1 tablet (5 mg total) by mouth  in the morning and at bedtime. 12/28/21  Yes Tillman Abide I, MD  glucose blood test strip Use daily as instructed 06/12/22  Yes Karie Schwalbe, MD  metoprolol succinate (TOPROL-XL) 25 MG 24 hr tablet Take 1 tablet (25 mg total) by mouth daily. 05/24/22  Yes Tillman Abide I, MD  pantoprazole (PROTONIX) 40 MG tablet TAKE 1 TABLET BY MOUTH EVERY DAY 06/15/22  Yes Karie Schwalbe, MD  phenazopyridine (PYRIDIUM) 200 MG tablet Take 1 tablet (200 mg total) by mouth 3 (three) times daily. 09/19/22  Yes Rodriguez-Southworth, Nettie Elm, PA-C    Family History Family History  Problem Relation Age of Onset   Coronary artery disease Mother    Diabetes Mellitus II Mother    Heart attack Mother 82   Diabetes Mellitus II Maternal Grandmother     Social History Social History   Tobacco Use   Smoking status: Never     Passive exposure: Never   Smokeless tobacco: Never  Vaping Use   Vaping status: Never Used  Substance Use Topics   Alcohol use: No    Alcohol/week: 0.0 standard drinks of alcohol    Comment: wine occassionally   Drug use: No     Allergies   Citalopram, Doxycycline hyclate, Erythromycin, Montelukast sodium, Nitrofurantoin, Sulfa antibiotics, Tramadol hcl, Venlafaxine hcl, Amoxicillin-pot clavulanate, and Clarithromycin   Review of Systems Review of Systems  As noted in HPI Physical Exam Triage Vital Signs ED Triage Vitals [09/19/22 1022]  Encounter Vitals Group     BP (!) 179/95     Systolic BP Percentile      Diastolic BP Percentile      Pulse Rate 78     Resp 20     Temp 98.6 F (37 C)     Temp src      SpO2 97 %     Weight      Height      Head Circumference      Peak Flow      Pain Score      Pain Loc      Pain Education      Exclude from Growth Chart    No data found.  Updated Vital Signs BP (!) 179/95   Pulse 78   Temp 98.6 F (37 C)   Resp 20   SpO2 97%   Visual Acuity Right Eye Distance:   Left Eye Distance:   Bilateral Distance:    Right Eye Near:   Left Eye Near:    Bilateral Near:     Physical Exam Physical Exam Vitals and nursing note reviewed.  Constitutional:      General: She is not in acute distress.    Appearance: She is not toxic-appearing.  HENT:     Head: Normocephalic.     Right Ear: External ear normal.     Left Ear: External ear normal.  Eyes:     General: No scleral icterus.    Conjunctiva/sclera: Conjunctivae normal.  Pulmonary:     Effort: Pulmonary effort is normal.  Abdominal:     General: Bowel sounds are normal.     Palpations: Abdomen is soft. There is no mass.     Tenderness: There is no guarding or rebound. She is tender on suprapubic region    Comments: - CVA tenderness   Musculoskeletal:        General: Normal range of motion.     Cervical back: Neck supple.    Skin:    General:  Skin is warm  and dry.     Findings: No rash.  Neurological:     Mental Status: She is alert and oriented to person, place, and time.     Gait: Gait normal.  Psychiatric:        Mood and Affect: Mood normal.        Behavior: Behavior normal.        Thought Content: Thought content normal.        Judgment: Judgment normal.    UC Treatments / Results  Labs (all labs ordered are listed, but only abnormal results are displayed) Labs Reviewed  POCT URINALYSIS DIP (MANUAL ENTRY) - Abnormal; Notable for the following components:      Result Value   Blood, UA trace-intact (*)    Leukocytes, UA Small (1+) (*)    All other components within normal limits  URINE CULTURE    EKG   Radiology No results found.  Procedures Procedures (including critical care time)  Medications Ordered in UC Medications - No data to display  Initial Impression / Assessment and Plan / UC Course  I have reviewed the triage vital signs and the nursing notes.  Pertinent labs results that were available during my care of the patient were reviewed by me and considered in my medical decision making (see chart for details).  UTI  I placed her on Keflex and pyridium as noted Urine culture was ordered and we will call her if we have to make any medication changes.    Final Clinical Impressions(s) / UC Diagnoses   Final diagnoses:  Acute cystitis without hematuria   Discharge Instructions   None    ED Prescriptions     Medication Sig Dispense Auth. Provider   cephALEXin (KEFLEX) 500 MG capsule Take 1 capsule (500 mg total) by mouth 3 (three) times daily. 21 capsule Rodriguez-Southworth, Nettie Elm, PA-C   phenazopyridine (PYRIDIUM) 200 MG tablet Take 1 tablet (200 mg total) by mouth 3 (three) times daily. 6 tablet Rodriguez-Southworth, Nettie Elm, PA-C      PDMP not reviewed this encounter.   Garey Ham, PA-C 09/19/22 1038

## 2022-09-24 ENCOUNTER — Encounter: Payer: Self-pay | Admitting: Emergency Medicine

## 2022-09-24 ENCOUNTER — Other Ambulatory Visit: Payer: Self-pay

## 2022-09-24 ENCOUNTER — Emergency Department
Admission: EM | Admit: 2022-09-24 | Discharge: 2022-09-24 | Disposition: A | Discharge status: Home / Self Care | Payer: Medicare HMO | Attending: Emergency Medicine | Admitting: Emergency Medicine

## 2022-09-24 DIAGNOSIS — N898 Other specified noninflammatory disorders of vagina: Secondary | ICD-10-CM | POA: Diagnosis not present

## 2022-09-24 DIAGNOSIS — Z7984 Long term (current) use of oral hypoglycemic drugs: Secondary | ICD-10-CM | POA: Diagnosis not present

## 2022-09-24 DIAGNOSIS — Z7902 Long term (current) use of antithrombotics/antiplatelets: Secondary | ICD-10-CM | POA: Insufficient documentation

## 2022-09-24 DIAGNOSIS — S30814A Abrasion of vagina and vulva, initial encounter: Secondary | ICD-10-CM | POA: Insufficient documentation

## 2022-09-24 DIAGNOSIS — N7689 Other specified inflammation of vagina and vulva: Secondary | ICD-10-CM | POA: Insufficient documentation

## 2022-09-24 DIAGNOSIS — L304 Erythema intertrigo: Secondary | ICD-10-CM

## 2022-09-24 DIAGNOSIS — N9089 Other specified noninflammatory disorders of vulva and perineum: Secondary | ICD-10-CM

## 2022-09-24 DIAGNOSIS — Z8673 Personal history of transient ischemic attack (TIA), and cerebral infarction without residual deficits: Secondary | ICD-10-CM | POA: Diagnosis not present

## 2022-09-24 DIAGNOSIS — N1831 Chronic kidney disease, stage 3a: Secondary | ICD-10-CM | POA: Diagnosis not present

## 2022-09-24 DIAGNOSIS — I129 Hypertensive chronic kidney disease with stage 1 through stage 4 chronic kidney disease, or unspecified chronic kidney disease: Secondary | ICD-10-CM | POA: Insufficient documentation

## 2022-09-24 DIAGNOSIS — Z79899 Other long term (current) drug therapy: Secondary | ICD-10-CM | POA: Diagnosis not present

## 2022-09-24 DIAGNOSIS — Z7982 Long term (current) use of aspirin: Secondary | ICD-10-CM | POA: Diagnosis not present

## 2022-09-24 DIAGNOSIS — E1122 Type 2 diabetes mellitus with diabetic chronic kidney disease: Secondary | ICD-10-CM | POA: Insufficient documentation

## 2022-09-24 DIAGNOSIS — J45909 Unspecified asthma, uncomplicated: Secondary | ICD-10-CM | POA: Diagnosis not present

## 2022-09-24 DIAGNOSIS — X58XXXA Exposure to other specified factors, initial encounter: Secondary | ICD-10-CM | POA: Diagnosis not present

## 2022-09-24 DIAGNOSIS — R102 Pelvic and perineal pain: Secondary | ICD-10-CM | POA: Diagnosis not present

## 2022-09-24 MED ORDER — HYDROCODONE-ACETAMINOPHEN 5-325 MG PO TABS
1.0000 | ORAL_TABLET | Freq: Once | ORAL | Status: DC
  Filled 2022-09-24: qty 1

## 2022-09-24 MED ORDER — ONDANSETRON 4 MG PO TBDP
4.0000 mg | ORAL_TABLET | Freq: Once | ORAL | Status: DC
  Filled 2022-09-24: qty 1

## 2022-09-24 MED ORDER — NYSTATIN 100000 UNIT/GM EX OINT: 1.0000 | TOPICAL_OINTMENT | Freq: Three times a day (TID) | CUTANEOUS | 0 refills | Status: DC

## 2022-09-24 MED ORDER — LIDOCAINE HCL URETHRAL/MUCOSAL 2 % EX GEL
1.0000 | Freq: Once | CUTANEOUS | Status: AC
  Administered 2022-09-24: 1 via TOPICAL
  Filled 2022-09-24: qty 10

## 2022-09-24 NOTE — ED Notes (Signed)
ED Provider at bedside. 

## 2022-09-24 NOTE — ED Provider Notes (Signed)
[facility] Provider Note    Event Date/Time   First MD Initiated Contact with Patient 09/24/22 [phone]     (approximate)   History   Groin Swelling   HPI  Tami Jones is a 67 y.o. female who presents to the ED from home with a chief complaint of vaginal pain and nausea.  Patient with a history of diabetes on glipizide who was seen at urgent care recently and prescribed Keflex for dysuria/UTI.  States she has had increased urinary frequency.  Now complaining of vaginal pain on the left side.  Denies fever/chills, cough, chest pain, shortness of breath, abdominal pain, rectal pain, vomiting or diarrhea.     Past Medical History   Past Medical History:  Diagnosis Date   Anxiety    Asthma    CVA (cerebral vascular accident) (HCC) 03/2006   right occipital, Dr. 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   TIA (transient ischemic attack) 03/25/2022   Asthma, chronic, unspecified asthma severity, with acute exacerbation 03/25/2022   RUQ pain 12/28/2021   Morbid obesity (HCC) 12/28/2021   Seizure disorder (HCC) 12/28/2021   Urinary frequency 11/27/2019   History of CVA with residual deficit 08/25/2019   Anxiety    Dyslipidemia    CKD (chronic kidney disease), stage IIIa    Neuropathic pain, arm 10/26/2018   Hemiparesis of right dominant side (HCC) 08/17/2018   Cerebrovascular disease 07/17/2014   Preventative health care 06/25/2014   Right sided sciatica 03/08/2013   MENOPAUSAL SYNDROME 09/10/2008   Mood disorder (HCC) 09/15/2007   Essential hypertension, benign 08/10/2006   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 EF 60%   CESAREAN SECTION     ESOPHAGOGASTRODUODENOSCOPY  05/2005   TONSILLECTOMY AND ADENOIDECTOMY       Home Medications   Prior to Admission medications   Medication Sig Start Date End Date Taking? Authorizing Provider  nystatin ointment (MYCOSTATIN) Apply 1 Application topically 3 (three) times daily. 09/24/22  Yes Irean Hong, MD  acetaminophen (TYLENOL) 325 MG tablet Take 650 mg by mouth every 6 (six) hours as needed.    [provider]  ALPRAZolam Prudy Feeler) 0.25 MG tablet Take 1 tablet (0.25 mg total) by mouth 3 (three) times daily as needed for anxiety or sleep. 12/24/20   Karie Schwalbe, MD  amLODipine (NORVASC) 10 MG tablet Take 1 tablet (10 mg total) by mouth every evening. 03/30/22   Karie Schwalbe, MD  aspirin EC 81 MG tablet Take 81 mg by mouth every evening.     [provider]  atorvastatin (LIPITOR) 80 MG tablet TAKE 1 TABLET BY MOUTH EVERY EVENING 06/11/22   Tillman Abide I, MD  cephALEXin (KEFLEX) 500 MG capsule Take 1 capsule (500 mg total) by mouth 3 (three) times daily. 09/19/22   Rodriguez-Southworth, Nettie Elm, PA-C  clopidogrel (PLAVIX) 75 MG tablet TAKE 1 TABLET BY MOUTH EVERY DAY 03/22/22   Tillman Abide I, MD  glipiZIDE (GLUCOTROL XL) 5 MG 24  hr tablet Take 1 tablet (5 mg total) by mouth in the morning and at bedtime. 12/28/21   Tillman Abide I, MD  glucose blood test strip Use daily as instructed 06/12/22   Karie Schwalbe, MD  metoprolol succinate (TOPROL-XL) 25 MG 24 hr tablet Take 1 tablet (25 mg total) by mouth daily. 05/24/22   Karie Schwalbe, MD  pantoprazole (PROTONIX) 40 MG tablet TAKE 1 TABLET BY MOUTH EVERY DAY 06/15/22   Karie Schwalbe, MD  phenazopyridine (PYRIDIUM) 200 MG tablet Take 1 tablet (200 mg total) by mouth 3 (three) times daily. 09/19/22   Rodriguez-Southworth, Nettie Elm, PA-C      Allergies  Citalopram, Doxycycline hyclate, Erythromycin, Montelukast sodium, Nitrofurantoin, Sulfa antibiotics, Tramadol hcl, Venlafaxine hcl, Amoxicillin-pot clavulanate, and Clarithromycin   Family History   Family History  Problem Relation Age of Onset   Coronary artery disease Mother    Diabetes Mellitus II Mother    Heart attack Mother 8   Diabetes Mellitus II Maternal Grandmother      Physical Exam  Triage Vital Signs: ED Triage Vitals  Encounter Vitals Group     BP 09/24/22 0432 (!) 190/91     Systolic BP Percentile --      Diastolic BP Percentile --      Pulse Rate 09/24/22 0432 80     Resp 09/24/22 0432 18     Temp 09/24/22 0432 98 F (36.7 C)     Temp Source 09/24/22 0432 Oral     SpO2 09/24/22 0432 98 %     Weight 09/24/22 0429 240 lb (108.9 kg)     Height 09/24/22 0429 5\' 3"  (1.6 m)     Head Circumference --      Peak Flow --      Pain Score 09/24/22 0429 7     Pain Loc --      Pain Education --      Exclude from Growth Chart --     Updated Vital Signs: BP (!) 190/91 (BP Location: Right Arm)   Pulse 80   Temp 98 F (36.7 C) (Oral)   Resp 18   Ht 5\' 3"  (1.6 m)   Wt 108.9 kg   SpO2 98%   BMI 42.51 kg/m    General: Awake, no distress.  CV:  RRR.  Good peripheral perfusion.  Resp:  Normal effort.  CTAB. Abd:  Nontender to light or deep palpation.  Intertriginous rash noted beneath pannus.  No distention.  Other:  External vaginal exam: Small scabbed over abrasion to left lower labia without surrounding warmth, erythema or fluctuance.  No swelling.  Inner thigh, perineum and rectum examined which are unremarkable.   ED Results / Procedures / Treatments  Labs (all labs ordered are listed, but only abnormal results are displayed) Labs Reviewed - No data to display   EKG  None   RADIOLOGY None   Official radiology report(s): No results found.   PROCEDURES:  Critical Care performed: No  Procedures   MEDICATIONS ORDERED  IN ED: Medications  ondansetron (ZOFRAN-ODT) disintegrating tablet 4 mg (4 mg Oral Patient Refused/Not Given 09/24/22 0506)  HYDROcodone-acetaminophen (NORCO/VICODIN) 5-325 MG per tablet 1 tablet (1 tablet Oral Patient Refused/Not Given 09/24/22 0506)  lidocaine (XYLOCAINE) 2 % jelly 1 Application (1 Application Topical Given 09/24/22 0502)     IMPRESSION / MDM / ASSESSMENT AND PLAN / ED COURSE  I reviewed the triage vital signs and the nursing notes.  67 year old female presenting with vaginal pain.  Left lower labial abrasion/excoriation most likely from wiping with toilet paper as patient has had increased urinary frequency from UTI.  She has no systemic symptoms of fever, abdominal pain, vomiting.  We discussed signs and symptoms of Fournier's gangrene which I have very low suspicion for tonight.  Will apply Lidocaine topical jelly, Norco, Zofran.  Prescribed nystatin ointment for intertrigo.  Strict return precautions given.  Patient verbalizes understanding and agrees with plan of care.  Patient's presentation is most consistent with acute, uncomplicated illness.  [id number] Patient declined Norco because lidocaine was already helping her pain.  FINAL CLINICAL IMPRESSION(S) / ED DIAGNOSES   Final diagnoses:  Labial irritation  Intertrigo     Rx / DC Orders   ED Discharge Orders          Ordered    nystatin ointment (MYCOSTATIN)  3 times daily        09/24/22 0502             Note:  This document was prepared using Dragon voice recognition software and may include unintentional dictation errors.   Irean Hong, MD 09/24/22 [phone]

## 2022-09-24 NOTE — Discharge Instructions (Addendum)
Apply barrier cream (diaper cream, Vaseline) to affected area several times daily as needed. Use wet wipes or clean in shower or bathtub instead of using toilet paper. Apply Nystatin ointment under abdomen for yeast rash. Return to the ER for worsening symptoms, persistent vomiting, difficulty breathing or other concerns.

## 2022-09-24 NOTE — ED Triage Notes (Signed)
Patient ambulatory to triage with steady gait, without difficulty or distress noted; pt reports going to UC on Sunday with c/o dysuria; rx keflex for UTI; now c/o vag swelling/irritation and nausea; denies any abd or back pain

## 2022-09-30 ENCOUNTER — Telehealth: Payer: Self-pay | Admitting: *Deleted

## 2022-09-30 NOTE — Telephone Encounter (Signed)
Transition Care Management Unsuccessful Follow-up Telephone Call  Date of discharge and from where:  [facility]  09/24/2022  Attempts:  1st Attempt  Reason for unsuccessful TCM follow-up call:  No answer/busy

## 2022-10-01 ENCOUNTER — Telehealth: Payer: Self-pay | Admitting: *Deleted

## 2022-10-01 NOTE — Telephone Encounter (Signed)
Transition Care Management Unsuccessful Follow-up Telephone Call  Date of discharge and from where:  [facility]  09/24/2022  Attempts:  2nd Attempt  Reason for unsuccessful TCM follow-up call:  No answer/busy

## 2022-10-03 ENCOUNTER — Emergency Department: Payer: Medicare HMO

## 2022-10-03 ENCOUNTER — Other Ambulatory Visit: Payer: Self-pay

## 2022-10-03 ENCOUNTER — Emergency Department
Admission: EM | Admit: 2022-10-03 | Discharge: 2022-10-03 | Disposition: A | Discharge status: Home / Self Care | Payer: Medicare HMO | Source: Home / Self Care | Attending: Emergency Medicine | Admitting: Emergency Medicine

## 2022-10-03 DIAGNOSIS — X58XXXA Exposure to other specified factors, initial encounter: Secondary | ICD-10-CM | POA: Diagnosis not present

## 2022-10-03 DIAGNOSIS — K5900 Constipation, unspecified: Secondary | ICD-10-CM | POA: Insufficient documentation

## 2022-10-03 DIAGNOSIS — S30814A Abrasion of vagina and vulva, initial encounter: Secondary | ICD-10-CM | POA: Diagnosis not present

## 2022-10-03 DIAGNOSIS — R300 Dysuria: Secondary | ICD-10-CM | POA: Insufficient documentation

## 2022-10-03 DIAGNOSIS — K6289 Other specified diseases of anus and rectum: Secondary | ICD-10-CM | POA: Diagnosis not present

## 2022-10-03 DIAGNOSIS — R109 Unspecified abdominal pain: Secondary | ICD-10-CM | POA: Diagnosis not present

## 2022-10-03 DIAGNOSIS — S3994XA Unspecified injury of external genitals, initial encounter: Secondary | ICD-10-CM | POA: Diagnosis not present

## 2022-10-03 LAB — URINALYSIS, ROUTINE W REFLEX MICROSCOPIC
Bilirubin Urine: NEGATIVE
Glucose, UA: 50 mg/dL — AB
Hgb urine dipstick: NEGATIVE
Ketones, ur: NEGATIVE mg/dL
Nitrite: NEGATIVE
Protein, ur: NEGATIVE mg/dL
Specific Gravity, Urine: 1.02 (ref 1.005–1.030)
Squamous Epithelial / HPF: >50 /HPF (ref 0–5)
WBC, UA: >50 WBC/hpf (ref 0–5)
pH: 5 (ref 5.0–8.0)

## 2022-10-03 LAB — COMPREHENSIVE METABOLIC PANEL
ALT: 20 U/L (ref 0–44)
AST: 17 U/L (ref 15–41)
Albumin: 4 g/dL (ref 3.5–5.0)
Alkaline Phosphatase: 78 U/L (ref 38–126)
Anion gap: 10 (ref 5–15)
BUN: 30 mg/dL — ABNORMAL HIGH (ref 8–23)
CO2: 25 mmol/L (ref 22–32)
Calcium: 8.9 mg/dL (ref 8.9–10.3)
Chloride: 103 mmol/L (ref 98–111)
Creatinine, Ser: 1.24 mg/dL — ABNORMAL HIGH (ref 0.44–1.00)
GFR, Estimated: 48 mL/min — ABNORMAL LOW (ref >60)
Glucose, Bld: 178 mg/dL — ABNORMAL HIGH (ref 70–99)
Potassium: 4.3 mmol/L (ref 3.5–5.1)
Sodium: 138 mmol/L (ref 135–145)
Total Bilirubin: 0.7 mg/dL (ref 0.3–1.2)
Total Protein: 7.5 g/dL (ref 6.5–8.1)

## 2022-10-03 LAB — CBC
HCT: 40.5 % (ref 36.0–46.0)
Hemoglobin: 13.3 g/dL (ref 12.0–15.0)
MCH: 30.4 pg (ref 26.0–34.0)
MCHC: 32.8 g/dL (ref 30.0–36.0)
MCV: 92.5 fL (ref 80.0–100.0)
Platelets: 268 10*3/uL (ref 150–400)
RBC: 4.38 MIL/uL (ref 3.87–5.11)
RDW: 12.7 % (ref 11.5–15.5)
WBC: 7.2 10*3/uL (ref 4.0–10.5)
nRBC: 0 % (ref 0.0–0.2)

## 2022-10-03 MED ORDER — LIDOCAINE HCL URETHRAL/MUCOSAL 2 % EX GEL
1.0000 | Freq: Once | CUTANEOUS | Status: AC
  Administered 2022-10-03: 1 via TOPICAL
  Filled 2022-10-03: qty 6

## 2022-10-03 MED ORDER — IOHEXOL 300 MG/ML  SOLN
80.0000 mL | Freq: Once | INTRAMUSCULAR | Status: AC | PRN
  Administered 2022-10-03: 80 mL via INTRAVENOUS

## 2022-10-03 MED ORDER — LIDOCAINE 5 % EX OINT: 1.0000 | TOPICAL_OINTMENT | CUTANEOUS | 0 refills | Status: AC | PRN

## 2022-10-03 NOTE — ED Triage Notes (Signed)
Pt reports issues with constipation, LBM 2 days ago. Not taking any meds for constipation.Endorses lower mid abdominal pain, nausea, and rectal pain.

## 2022-10-03 NOTE — Discharge Instructions (Signed)
? ?  Please return to the emergency room right away if you are to develop a fever, severe nausea, your pain becomes severe or worsens, you are unable to keep food down, begin vomiting any dark or bloody fluid, you develop any dark or bloody stools, feel dehydrated, or other new concerns or symptoms arise. ? ?

## 2022-10-03 NOTE — ED Provider Notes (Signed)
[facility] Provider Note    Event Date/Time   First MD Initiated Contact with Patient 10/03/22 [phone]     (approximate)   History   Constipation   HPI  Tami Jones is a 67 y.o. female here for evaluation of ongoing pain around her left lower vaginal region.  Patient reports she scraped the area and has been causing pain.  It continues on, and does not seem to feel lower.  She has been using Monistat as well.  No discomfort with urination no pain within the vagina itself.  She reports the pain is in an area just left of the lower part of entrance to the vagina.  On physical examination she points to an 8 area over the left lower labia that appears to have an abrasion  She also reports that she feels somewhat constipated with some discomfort in the left lower labia when she tries to defecate and also around her rectum off and on.  She is still passing gas but has not had a normal bowel movement in a couple days.  No nausea or vomiting     Physical Exam   Triage Vital Signs: ED Triage Vitals  Encounter Vitals Group     BP 10/03/22 0301 (!) 190/105     Systolic BP Percentile --      Diastolic BP Percentile --      Pulse Rate 10/03/22 0301 90     Resp 10/03/22 0301 20     Temp 10/03/22 0301 97.8 F (36.6 C)     Temp Source 10/03/22 0301 Oral     SpO2 10/03/22 0304 98 %     Weight --      Height --      Head Circumference --      Peak Flow --      Pain Score 10/03/22 0259 7     Pain Loc --      Pain Education --      Exclude from Growth Chart --     Most recent vital signs: Vitals:   10/03/22 0301 10/03/22 0304  BP: (!) 190/105   Pulse: 90   Resp: 20   Temp: 97.8 F (36.6 C)   SpO2:  98%     General: Awake, no distress.  CV:  Good peripheral perfusion.  Resp:  Normal effort.  Abd:  No distention.  Abdomen soft nontender nondistended throughout  External genital exam and rectal exam escorted by nurse Morrie Sheldon  Patient on external exam  has a small approximately 1 inch abrasion with slight scabbing without surrounding erythema or fluctuance noted around the left lower labia that she reports is an area for pain and discomfort.  Inspection of the introitus to the vagina appears quite normal.  The right side of the lower labial region is normal, with exception to a cystic structure along the inferior border that appears to be chronic and patient reports not new  Other:  Rectal exam demonstrates normal rectum.  No perirectal inflammation or masses.  The perineum appears normal.  There is no fluctuance or areas of abscess noted.  No pain in the gluteal cleft's or abnormalities.  Escorted by [facility]   ED Results / Procedures / Treatments   Labs (all labs ordered are listed, but only abnormal results are displayed) Labs Reviewed  COMPREHENSIVE METABOLIC PANEL - Abnormal; Notable for the following components:      Result Value   Glucose, Bld 178 (*)  BUN 30 (*)    Creatinine, Ser 1.24 (*)    GFR, Estimated 48 (*)    All other components within normal limits  URINALYSIS, ROUTINE W REFLEX MICROSCOPIC - Abnormal; Notable for the following components:   Color, Urine YELLOW (*)    APPearance CLOUDY (*)    Glucose, UA 50 (*)    Leukocytes,Ua LARGE (*)    Bacteria, UA RARE (*)    All other components within normal limits  URINE CULTURE  CBC     EKG     RADIOLOGY CT PELVIS W CONTRAST  Result Date: 10/03/2022 CLINICAL DATA:  Patient complains of left labial and Peri rectal pain for 1 week. Evaluate for underlying lesion or abscess. Cyst-like structure noted along the right lower labia, chronic. Patient also complains of constipation. EXAM: CT PELVIS WITH CONTRAST TECHNIQUE: Multidetector CT imaging of the pelvis was performed using the standard protocol following the bolus administration of intravenous contrast. RADIATION DOSE REDUCTION: This exam was performed according to the departmental dose-optimization program which  includes automated exposure control, adjustment of the mA and/or kV according to patient size and/or use of iterative reconstruction technique. CONTRAST:  80mL OMNIPAQUE IOHEXOL 300 MG/ML  SOLN COMPARISON:  CT AP from 02/25/2021 FINDINGS: Urinary Tract:  No abnormality visualized. Bowel: No dilated loops of large or small bowel. No bowel wall thickening or inflammation. No signs of perirectal or perianal fluid collection or mass. Vascular/Lymphatic: Aortic atherosclerosis. No signs of abdominopelvic adenopathy. Reproductive: No uterine or adnexal mass identified. No labial fluid collection, inflammation or mass identified. Endometrial stripe is measures between 4-5 mm. Other:  No free fluid or focal fluid collections identified. Musculoskeletal: No acute or suspicious osseous findings. IMPRESSION: 1. No acute findings within the pelvis. 2. No signs of perirectal, perianal, or labial fluid collection or mass. 3.  Aortic Atherosclerosis (ICD10-I70.0). Electronically Signed   By: Signa Kell M.D.   On: 10/03/2022 11:15       PROCEDURES:  Critical Care performed: No  Procedures   MEDICATIONS ORDERED IN ED: Medications  lidocaine (XYLOCAINE) 2 % jelly 1 Application (1 Application Topical Given 10/03/22 0818)  iohexol (OMNIPAQUE) 300 MG/ML solution 80 mL (80 mLs Intravenous Contrast Given 10/03/22 1056)     IMPRESSION / MDM / ASSESSMENT AND PLAN / ED COURSE  I reviewed the triage vital signs and the nursing notes.                              Differential diagnosis includes, but is not limited to, abrasion in the left lower labial region that appears to be healing but causing the patient significant pain and discomfort.  She also reports some extension of pain into the perirectal region.  Her exam externally demonstrates abrasion, but given her repeat evaluation and concerns of ongoing pain in the area will obtain CT to exclude other causation such as abscess mass etc. in the region that could be  causing or referring pain towards the perirectal region.  She is awake alert hemodynamically stable.  Her blood pressure is elevated today but she also reports that when she gets anxious about things her blood pressure goes high.  Will observe but doubt patient needs acute blood pressure lowering treatment she has no symptoms of urgency  Patient's presentation is most consistent with acute complicated illness / injury requiring diagnostic workup.  CBC normal.  Mild chronic renal insufficiency in her labs.  Urinalysis no clear evidence  of infection and patient lacks urinary symptoms is sent for culture  CT pelvis reassuring without acute finding.  Patient reports significant improvement in symptoms after lidocaine.  Will discharge with lidocaine as needed, and follow-up with PCP with careful return precautions advised.  Patient agreeable with plan  Return precautions and treatment recommendations and follow-up discussed with the patient who is agreeable with the plan.         FINAL CLINICAL IMPRESSION(S) / ED DIAGNOSES   Final diagnoses:  Abrasion of labia, initial encounter     Rx / DC Orders   ED Discharge Orders          Ordered    lidocaine (XYLOCAINE) 5 % ointment  As needed        10/03/22 0809             Note:  This document was prepared using Dragon voice recognition software and may include unintentional dictation errors.   Sharyn Creamer, MD 10/03/22 1610

## 2022-10-03 NOTE — ED Notes (Signed)
Patient transported to CT 

## 2022-10-05 LAB — URINE CULTURE: Culture: 60000 — AB

## 2022-10-06 NOTE — Progress Notes (Signed)
ED Antimicrobial Stewardship Positive Culture Follow Up   Tami Jones is an 67 y.o. female who presented to [facility] on 10/03/2022 with a chief complaint of  Chief Complaint  Patient presents with   Constipation    Recent Results (from the past 720 hour(s))  Urine Culture     Status: None   Collection Time: 09/19/22 10:36 AM   Specimen: Urine, Clean Catch  Result Value Ref Range Status   Specimen Description URINE, CLEAN CATCH  Final   Special Requests Immunocompromised  Final   Culture   Final    NO GROWTH Performed at [facility], [address], [place], [place] [address]    Report Status 09/20/2022 FINAL  Final  Urine Culture     Status: Abnormal   Collection Time: 10/03/22  3:03 AM   Specimen: Urine, Clean Catch  Result Value Ref Range Status   Specimen Description   Final    URINE, CLEAN CATCH Performed at [facility], [address] [place], [place] [address]    Special Requests   Final    NONE Performed at [facility], [address] [place], [place] [address]    Culture 60,000 COLONIES/mL ENTEROCOCCUS FAECALIS (A)  Final   Report Status 10/05/2022 FINAL  Final   Organism ID, Bacteria ENTEROCOCCUS FAECALIS (A)  Final      Susceptibility   Enterococcus faecalis - MIC*    AMPICILLIN <=2 SENSITIVE Sensitive     NITROFURANTOIN <=16 SENSITIVE Sensitive     VANCOMYCIN 1 SENSITIVE Sensitive     * 60,000 COLONIES/mL ENTEROCOCCUS FAECALIS    [x]  Patient discharged originally without antimicrobial agent and treatment is now indicated  New antibiotic prescription: N/A  ED Provider: Ledell Peoples patient regarding positive urine culture(e. Phineas Inches, pan-sensitive) to see if patient was having any urinary symptoms since ED visit. Patient stated that vaginal pain has improved and has no problems with urination or frequency. Spoke with Dr. Erma Heritage about this, and agree to defer antibiotic treatment. Likely contaminant.    Bettey Costa ,[facility] Clinical Pharmacist  10/06/2022, 1:37 PM

## 2022-10-11 ENCOUNTER — Telehealth: Payer: Self-pay

## 2022-10-11 NOTE — Telephone Encounter (Signed)
Transition Care Management Unsuccessful Follow-up Telephone Call  Date of discharge and from where:  [place] 8/18  Attempts:  1st Attempt  Reason for unsuccessful TCM follow-up call:  No answer/busy   Lenard Forth [place]  [facility], [facility] Guide, Phone: [phone] Website: [email or web address]

## 2022-10-12 ENCOUNTER — Telehealth: Payer: Self-pay

## 2022-10-12 NOTE — Telephone Encounter (Signed)
Transition Care Management Follow-up Telephone Call Date of discharge and from where: [place] 8/18 How have you been since you were released from the hospital?  Not doing any better and has complaints of the ED. Pt was neglected rails up and no call bell Any questions or concerns? Yes  Items Reviewed: Did the pt receive and understand the discharge instructions provided? Yes  Medications obtained and verified? No  cost to much $300 Other? No  Any new allergies since your discharge? No  Dietary orders reviewed? No Do you have support at home? Yes    Follow up appointments reviewed:  PCP Hospital f/u appt confirmed? Yes   calling to make an appointment   on  @ . Specialist Hospital f/u appt confirmed? No  Scheduled to see    @ . Are transportation arrangements needed? No  If their condition worsens, is the pt aware to call PCP or go to the Emergency Dept.? Yes Was the patient provided with contact information for the PCP's office or ED? Yes Was to pt encouraged to call back with questions or concerns? Yes

## 2022-10-28 DIAGNOSIS — H2511 Age-related nuclear cataract, right eye: Secondary | ICD-10-CM | POA: Diagnosis not present

## 2022-10-28 DIAGNOSIS — H2513 Age-related nuclear cataract, bilateral: Secondary | ICD-10-CM | POA: Diagnosis not present

## 2022-10-28 DIAGNOSIS — H25013 Cortical age-related cataract, bilateral: Secondary | ICD-10-CM | POA: Diagnosis not present

## 2022-10-28 DIAGNOSIS — E119 Type 2 diabetes mellitus without complications: Secondary | ICD-10-CM | POA: Diagnosis not present

## 2022-10-28 DIAGNOSIS — H25043 Posterior subcapsular polar age-related cataract, bilateral: Secondary | ICD-10-CM | POA: Diagnosis not present

## 2022-11-08 ENCOUNTER — Other Ambulatory Visit: Payer: Self-pay

## 2022-11-08 ENCOUNTER — Emergency Department
Admission: EM | Admit: 2022-11-08 | Discharge: 2022-11-08 | Disposition: A | Discharge status: Home / Self Care | Payer: Medicare HMO | Attending: Emergency Medicine | Admitting: Emergency Medicine

## 2022-11-08 ENCOUNTER — Emergency Department: Payer: Medicare HMO

## 2022-11-08 DIAGNOSIS — E119 Type 2 diabetes mellitus without complications: Secondary | ICD-10-CM | POA: Diagnosis not present

## 2022-11-08 DIAGNOSIS — Z79899 Other long term (current) drug therapy: Secondary | ICD-10-CM | POA: Insufficient documentation

## 2022-11-08 DIAGNOSIS — J45909 Unspecified asthma, uncomplicated: Secondary | ICD-10-CM | POA: Diagnosis not present

## 2022-11-08 DIAGNOSIS — I10 Essential (primary) hypertension: Secondary | ICD-10-CM | POA: Insufficient documentation

## 2022-11-08 DIAGNOSIS — Z20822 Contact with and (suspected) exposure to covid-19: Secondary | ICD-10-CM | POA: Insufficient documentation

## 2022-11-08 DIAGNOSIS — N390 Urinary tract infection, site not specified: Secondary | ICD-10-CM | POA: Diagnosis not present

## 2022-11-08 DIAGNOSIS — I700 Atherosclerosis of aorta: Secondary | ICD-10-CM | POA: Diagnosis not present

## 2022-11-08 DIAGNOSIS — R079 Chest pain, unspecified: Secondary | ICD-10-CM | POA: Diagnosis not present

## 2022-11-08 LAB — URINALYSIS, ROUTINE W REFLEX MICROSCOPIC
Bilirubin Urine: NEGATIVE
Glucose, UA: NEGATIVE mg/dL
Hgb urine dipstick: NEGATIVE
Ketones, ur: NEGATIVE mg/dL
Nitrite: NEGATIVE
Protein, ur: 30 mg/dL — AB
Specific Gravity, Urine: 1.021 (ref 1.005–1.030)
WBC, UA: >50 WBC/hpf (ref 0–5)
pH: 5 (ref 5.0–8.0)

## 2022-11-08 LAB — CBC
HCT: 42.9 % (ref 36.0–46.0)
Hemoglobin: 14 g/dL (ref 12.0–15.0)
MCH: 30.2 pg (ref 26.0–34.0)
MCHC: 32.6 g/dL (ref 30.0–36.0)
MCV: 92.7 fL (ref 80.0–100.0)
Platelets: 265 10*3/uL (ref 150–400)
RBC: 4.63 MIL/uL (ref 3.87–5.11)
RDW: 12.3 % (ref 11.5–15.5)
WBC: 5.8 10*3/uL (ref 4.0–10.5)
nRBC: 0 % (ref 0.0–0.2)

## 2022-11-08 LAB — BASIC METABOLIC PANEL
Anion gap: 10 (ref 5–15)
BUN: 16 mg/dL (ref 8–23)
CO2: 25 mmol/L (ref 22–32)
Calcium: 9.1 mg/dL (ref 8.9–10.3)
Chloride: 104 mmol/L (ref 98–111)
Creatinine, Ser: 1.09 mg/dL — ABNORMAL HIGH (ref 0.44–1.00)
GFR, Estimated: 56 mL/min — ABNORMAL LOW (ref >60)
Glucose, Bld: 136 mg/dL — ABNORMAL HIGH (ref 70–99)
Potassium: 4 mmol/L (ref 3.5–5.1)
Sodium: 139 mmol/L (ref 135–145)

## 2022-11-08 LAB — SARS CORONAVIRUS 2 BY RT PCR: SARS Coronavirus 2 by RT PCR: NEGATIVE

## 2022-11-08 LAB — TROPONIN I (HIGH SENSITIVITY): Troponin I (High Sensitivity): 11 ng/L (ref <18)

## 2022-11-08 MED ORDER — AMLODIPINE BESYLATE 5 MG PO TABS
10.0000 mg | ORAL_TABLET | Freq: Once | ORAL | Status: AC
  Administered 2022-11-08: 10 mg via ORAL
  Filled 2022-11-08: qty 2

## 2022-11-08 MED ORDER — CEPHALEXIN 500 MG PO CAPS
500.0000 mg | ORAL_CAPSULE | Freq: Once | ORAL | Status: AC
  Administered 2022-11-08: 500 mg via ORAL
  Filled 2022-11-08: qty 1

## 2022-11-08 MED ORDER — ACETAMINOPHEN 325 MG PO TABS
650.0000 mg | ORAL_TABLET | Freq: Once | ORAL | Status: AC
  Administered 2022-11-08: 650 mg via ORAL
  Filled 2022-11-08: qty 2

## 2022-11-08 MED ORDER — CEPHALEXIN 500 MG PO CAPS: 500.0000 mg | ORAL_CAPSULE | Freq: Two times a day (BID) | ORAL | 0 refills | Status: AC

## 2022-11-08 MED ORDER — ONDANSETRON 4 MG PO TBDP
4.0000 mg | ORAL_TABLET | Freq: Once | ORAL | Status: AC
  Administered 2022-11-08: 4 mg via ORAL
  Filled 2022-11-08: qty 1

## 2022-11-08 NOTE — ED Triage Notes (Signed)
Pt to ED for HTN and nausea. Also reports urinary sx. Also reports chest pain

## 2022-11-08 NOTE — Discharge Instructions (Addendum)
The antibiotic is for him to finish the full course.  You should continue to take your metoprolol and amlodipine as prescribed, and keep a log of your blood pressures checking them once or twice daily for the next week.  That way if you are having persistent fluctuations or elevated blood pressure readings your doctor can adjust your blood pressure medications as needed.  Follow-up with your primary care provider.  Return to the ER for new, worsening, or persistent severe chest pain, difficulty breathing, weakness or lightheadedness, abdominal pain, fever, vomiting, or any other new or worsening symptoms that concern you.

## 2022-11-08 NOTE — ED Provider Notes (Signed)
[facility] Provider Note    Event Date/Time   First MD Initiated Contact with Patient 11/08/22 1742     (approximate)   History   Hypertension, Nausea, and Chest Pain   HPI  Tami Jones is a 67 y.o. female with a history of diabetes, hypertension, hyperlipidemia, CVA, asthma, and GERD who presents with turn for elevated blood pressure.  The patient states that since last night her blood pressure was over 200 systolic and over 100 diastolic.  Her daughter reports that she has occasional night terrors and will take 1/2 tablet of Xanax.  These happen up to a few times per month.  She had 1 of these episodes last night.  Sometimes her blood pressure will be elevated with this, but then will go back to normal.  However, since last night the blood pressure has remained elevated.  In addition, has nausea, some suprapubic abdominal pain, feels a bit lightheaded, and has mild shortness of breath.  She also had some chest pain which she associates with elevated blood pressure.  She has had this before.  The patient takes metoprolol in the morning, and took it today.  She usually takes amlodipine around 6 PM and has not yet had it today.  I reviewed the past medical records.  The patient's most recent visit was on 8/13 for vaginal pain.  Her most recent non-ED outpatient encounter was with primary care on 4/8 for follow-up after a hospitalization in February for a TIA.    Physical Exam   Triage Vital Signs: ED Triage Vitals  Encounter Vitals Group     BP 11/08/22 1710 (!) 211/130     Systolic BP Percentile --      Diastolic BP Percentile --      Pulse Rate 11/08/22 1708 95     Resp 11/08/22 1708 18     Temp 11/08/22 1708 98 F (36.7 C)     Temp src --      SpO2 11/08/22 1708 100 %     Weight 11/08/22 1709 235 lb (106.6 kg)     Height 11/08/22 1709 5\' 4"  (1.626 m)     Head Circumference --      Peak Flow --      Pain Score 11/08/22 1709 7     Pain Loc --       Pain Education --      Exclude from Growth Chart --     Most recent vital signs: Vitals:   11/08/22 1930 11/08/22 2000  BP: (!) 178/90 (!) 161/83  Pulse:    Resp:    Temp:    SpO2:       General: Awake, no distress.  CV:  Good peripheral perfusion. Resp:  Normal effort.  Lungs CTAB. Abd:  Soft and nontender.  No distention.  Other:  Normal speech.  No facial droop.  Motor intact in all extremities.   ED Results / Procedures / Treatments   Labs (all labs ordered are listed, but only abnormal results are displayed) Labs Reviewed  BASIC METABOLIC PANEL - Abnormal; Notable for the following components:      Result Value   Glucose, Bld 136 (*)    Creatinine, Ser 1.09 (*)    GFR, Estimated 56 (*)    All other components within normal limits  URINALYSIS, ROUTINE W REFLEX MICROSCOPIC - Abnormal; Notable for the following components:   Color, Urine YELLOW (*)    APPearance CLOUDY (*)  Protein, ur 30 (*)    Leukocytes,Ua LARGE (*)    Bacteria, UA RARE (*)    All other components within normal limits  SARS CORONAVIRUS 2 BY RT PCR  CBC  TROPONIN I (HIGH SENSITIVITY)     EKG  ED ECG REPORT I, Dionne Bucy, the attending physician, personally viewed and interpreted this ECG.  Date: 11/08/2022 EKG Time: 1713 Rate: 86 Rhythm: normal sinus rhythm QRS Axis: normal Intervals: normal ST/T Wave abnormalities: Nonspecific ST abnormalities Narrative Interpretation: no evidence of acute ischemia    RADIOLOGY  Chest x-ray: I independently viewed and interpreted the images; there is no focal consolidation or edema   PROCEDURES:  Critical Care performed: No  Procedures   MEDICATIONS ORDERED IN ED: Medications  ondansetron (ZOFRAN-ODT) disintegrating tablet 4 mg (4 mg Oral Given 11/08/22 1818)  acetaminophen (TYLENOL) tablet 650 mg (650 mg Oral Given 11/08/22 1818)  amLODipine (NORVASC) tablet 10 mg (10 mg Oral Given 11/08/22 2011)  cephALEXin (KEFLEX) capsule  500 mg (500 mg Oral Given 11/08/22 2309)     IMPRESSION / MDM / ASSESSMENT AND PLAN / ED COURSE  I reviewed the triage vital signs and the nursing notes.  67 year old female presents with elevated blood pressure since last night associated with multiple symptoms today including nausea, headache, shortness of breath, lightheadedness, and chest pain.  On exam the patient overall appears well.  She is simply hypertensive with otherwise normal vital signs.  Differential diagnosis includes, but is not limited to, hypertensive urgency, ACS, viral syndrome, UTI.  We will obtain basic labs, cardiac enzymes, urinalysis, COVID swab, and reassess.  Patient's presentation is most consistent with acute presentation with potential threat to life or bodily function.  The patient is on the cardiac monitor to evaluate for evidence of arrhythmia and/or significant heart rate changes.  ----------------------------------------- 10:57 PM on 11/08/2022 -----------------------------------------  Lab workup is unremarkable.  Troponin is negative.  COVID is negative.  There is no leukocytosis or anemia.  Electrolytes are unremarkable.  We gave the patient her evening dose of amlodipine and her blood pressure has come down significantly.  Urinalysis does show findings consistent with possible UTI.  Given that the patient has been having some suprapubic pain and urinary symptoms we will treat empirically with Keflex.  At this time, the patient appears well, has improved blood pressure, and is stable for discharge home.  She feels comfortable going home.  I counseled her on the results of the workup and plan of care.  I gave strict return precautions and she expressed understanding.  FINAL CLINICAL IMPRESSION(S) / ED DIAGNOSES   Final diagnoses:  Hypertension, unspecified type  Urinary tract infection without hematuria, site unspecified     Rx / DC Orders   ED Discharge Orders          Ordered    cephALEXin  (KEFLEX) 500 MG capsule  2 times daily        11/08/22 2256             Note:  This document was prepared using Dragon voice recognition software and may include unintentional dictation errors.    Dionne Bucy, MD 11/08/22 2352

## 2022-11-15 ENCOUNTER — Inpatient Hospital Stay: Payer: Medicare HMO | Admitting: Internal Medicine

## 2022-11-18 ENCOUNTER — Inpatient Hospital Stay: Payer: Medicare HMO | Admitting: Internal Medicine

## 2022-11-24 ENCOUNTER — Emergency Department: Payer: Medicare HMO

## 2022-11-24 ENCOUNTER — Other Ambulatory Visit: Payer: Self-pay

## 2022-11-24 ENCOUNTER — Inpatient Hospital Stay
Admission: EM | Admit: 2022-11-24 | Discharge: 2022-11-26 | DRG: 069 | Disposition: A | Discharge status: Home / Self Care | Payer: Medicare HMO | Attending: Student in an Organized Health Care Education/Training Program | Admitting: Student in an Organized Health Care Education/Training Program

## 2022-11-24 ENCOUNTER — Inpatient Hospital Stay: Payer: Medicare HMO

## 2022-11-24 ENCOUNTER — Encounter: Payer: Self-pay | Admitting: Family Medicine

## 2022-11-24 DIAGNOSIS — R202 Paresthesia of skin: Secondary | ICD-10-CM

## 2022-11-24 DIAGNOSIS — Z743 Need for continuous supervision: Secondary | ICD-10-CM | POA: Diagnosis not present

## 2022-11-24 DIAGNOSIS — G459 Transient cerebral ischemic attack, unspecified: Principal | ICD-10-CM

## 2022-11-24 DIAGNOSIS — E785 Hyperlipidemia, unspecified: Secondary | ICD-10-CM

## 2022-11-24 DIAGNOSIS — Z833 Family history of diabetes mellitus: Secondary | ICD-10-CM

## 2022-11-24 DIAGNOSIS — J45909 Unspecified asthma, uncomplicated: Secondary | ICD-10-CM | POA: Diagnosis present

## 2022-11-24 DIAGNOSIS — Z7982 Long term (current) use of aspirin: Secondary | ICD-10-CM

## 2022-11-24 DIAGNOSIS — Z882 Allergy status to sulfonamides status: Secondary | ICD-10-CM | POA: Diagnosis not present

## 2022-11-24 DIAGNOSIS — K219 Gastro-esophageal reflux disease without esophagitis: Secondary | ICD-10-CM | POA: Diagnosis present

## 2022-11-24 DIAGNOSIS — Z5986 Financial insecurity: Secondary | ICD-10-CM

## 2022-11-24 DIAGNOSIS — E1169 Type 2 diabetes mellitus with other specified complication: Secondary | ICD-10-CM

## 2022-11-24 DIAGNOSIS — I10 Essential (primary) hypertension: Secondary | ICD-10-CM | POA: Diagnosis present

## 2022-11-24 DIAGNOSIS — E1165 Type 2 diabetes mellitus with hyperglycemia: Secondary | ICD-10-CM | POA: Diagnosis not present

## 2022-11-24 DIAGNOSIS — I672 Cerebral atherosclerosis: Secondary | ICD-10-CM | POA: Diagnosis not present

## 2022-11-24 DIAGNOSIS — F419 Anxiety disorder, unspecified: Secondary | ICD-10-CM | POA: Diagnosis present

## 2022-11-24 DIAGNOSIS — Z79899 Other long term (current) drug therapy: Secondary | ICD-10-CM

## 2022-11-24 DIAGNOSIS — I651 Occlusion and stenosis of basilar artery: Secondary | ICD-10-CM | POA: Diagnosis not present

## 2022-11-24 DIAGNOSIS — Z880 Allergy status to penicillin: Secondary | ICD-10-CM | POA: Diagnosis not present

## 2022-11-24 DIAGNOSIS — Z881 Allergy status to other antibiotic agents status: Secondary | ICD-10-CM | POA: Diagnosis not present

## 2022-11-24 DIAGNOSIS — Z8249 Family history of ischemic heart disease and other diseases of the circulatory system: Secondary | ICD-10-CM

## 2022-11-24 DIAGNOSIS — I6782 Cerebral ischemia: Secondary | ICD-10-CM | POA: Diagnosis not present

## 2022-11-24 DIAGNOSIS — Z6841 Body Mass Index (BMI) 40.0 and over, adult: Secondary | ICD-10-CM

## 2022-11-24 DIAGNOSIS — I160 Hypertensive urgency: Secondary | ICD-10-CM | POA: Diagnosis not present

## 2022-11-24 DIAGNOSIS — G40909 Epilepsy, unspecified, not intractable, without status epilepticus: Secondary | ICD-10-CM | POA: Diagnosis present

## 2022-11-24 DIAGNOSIS — I161 Hypertensive emergency: Secondary | ICD-10-CM | POA: Diagnosis not present

## 2022-11-24 DIAGNOSIS — I6602 Occlusion and stenosis of left middle cerebral artery: Secondary | ICD-10-CM | POA: Diagnosis not present

## 2022-11-24 DIAGNOSIS — I679 Cerebrovascular disease, unspecified: Secondary | ICD-10-CM | POA: Diagnosis present

## 2022-11-24 DIAGNOSIS — R0789 Other chest pain: Secondary | ICD-10-CM | POA: Diagnosis not present

## 2022-11-24 DIAGNOSIS — N390 Urinary tract infection, site not specified: Secondary | ICD-10-CM

## 2022-11-24 DIAGNOSIS — I6503 Occlusion and stenosis of bilateral vertebral arteries: Secondary | ICD-10-CM | POA: Diagnosis not present

## 2022-11-24 DIAGNOSIS — R519 Headache, unspecified: Secondary | ICD-10-CM | POA: Diagnosis not present

## 2022-11-24 DIAGNOSIS — E1159 Type 2 diabetes mellitus with other circulatory complications: Secondary | ICD-10-CM | POA: Diagnosis present

## 2022-11-24 DIAGNOSIS — Z7984 Long term (current) use of oral hypoglycemic drugs: Secondary | ICD-10-CM | POA: Diagnosis not present

## 2022-11-24 DIAGNOSIS — R29818 Other symptoms and signs involving the nervous system: Secondary | ICD-10-CM | POA: Diagnosis not present

## 2022-11-24 DIAGNOSIS — I69351 Hemiplegia and hemiparesis following cerebral infarction affecting right dominant side: Secondary | ICD-10-CM

## 2022-11-24 DIAGNOSIS — I6521 Occlusion and stenosis of right carotid artery: Secondary | ICD-10-CM | POA: Diagnosis not present

## 2022-11-24 DIAGNOSIS — Z7902 Long term (current) use of antithrombotics/antiplatelets: Secondary | ICD-10-CM

## 2022-11-24 DIAGNOSIS — I6381 Other cerebral infarction due to occlusion or stenosis of small artery: Secondary | ICD-10-CM | POA: Diagnosis not present

## 2022-11-24 DIAGNOSIS — Z888 Allergy status to other drugs, medicaments and biological substances status: Secondary | ICD-10-CM | POA: Diagnosis not present

## 2022-11-24 LAB — CBC
HCT: 41.6 % (ref 36.0–46.0)
Hemoglobin: 13.9 g/dL (ref 12.0–15.0)
MCH: 30.8 pg (ref 26.0–34.0)
MCHC: 33.4 g/dL (ref 30.0–36.0)
MCV: 92 fL (ref 80.0–100.0)
Platelets: 226 10*3/uL (ref 150–400)
RBC: 4.52 MIL/uL (ref 3.87–5.11)
RDW: 12.4 % (ref 11.5–15.5)
WBC: 6.6 10*3/uL (ref 4.0–10.5)
nRBC: 0 % (ref 0.0–0.2)

## 2022-11-24 LAB — BASIC METABOLIC PANEL
Anion gap: 13 (ref 5–15)
BUN: 21 mg/dL (ref 8–23)
CO2: 24 mmol/L (ref 22–32)
Calcium: 8.3 mg/dL — ABNORMAL LOW (ref 8.9–10.3)
Chloride: 100 mmol/L (ref 98–111)
Creatinine, Ser: 1.12 mg/dL — ABNORMAL HIGH (ref 0.44–1.00)
GFR, Estimated: 55 mL/min — ABNORMAL LOW (ref >60)
Glucose, Bld: 213 mg/dL — ABNORMAL HIGH (ref 70–99)
Potassium: 3.6 mmol/L (ref 3.5–5.1)
Sodium: 137 mmol/L (ref 135–145)

## 2022-11-24 LAB — URINALYSIS, ROUTINE W REFLEX MICROSCOPIC
Bilirubin Urine: NEGATIVE
Glucose, UA: >=500 mg/dL — AB
Hgb urine dipstick: NEGATIVE
Ketones, ur: NEGATIVE mg/dL
Nitrite: NEGATIVE
Protein, ur: NEGATIVE mg/dL
Specific Gravity, Urine: 1.015 (ref 1.005–1.030)
pH: 6 (ref 5.0–8.0)

## 2022-11-24 LAB — HEMOGLOBIN A1C
Hgb A1c MFr Bld: 7.4 % — ABNORMAL HIGH (ref 4.8–5.6)
Mean Plasma Glucose: 165.68 mg/dL

## 2022-11-24 LAB — GLUCOSE, CAPILLARY: Glucose-Capillary: 167 mg/dL — ABNORMAL HIGH (ref 70–99)

## 2022-11-24 LAB — TROPONIN I (HIGH SENSITIVITY)
Troponin I (High Sensitivity): 11 ng/L (ref <18)
Troponin I (High Sensitivity): 13 ng/L

## 2022-11-24 MED ORDER — PANTOPRAZOLE SODIUM 40 MG PO TBEC
40.0000 mg | DELAYED_RELEASE_TABLET | Freq: Every day | ORAL | Status: DC
  Administered 2022-11-25 – 2022-11-26 (×2): 40 mg via ORAL
  Filled 2022-11-24 (×2): qty 1

## 2022-11-24 MED ORDER — INSULIN ASPART 100 UNIT/ML IJ SOLN
0.0000 [IU] | Freq: Three times a day (TID) | INTRAMUSCULAR | Status: DC
  Administered 2022-11-25 – 2022-11-26 (×3): 2 [IU] via SUBCUTANEOUS
  Administered 2022-11-26: 3 [IU] via SUBCUTANEOUS
  Filled 2022-11-24 (×5): qty 1

## 2022-11-24 MED ORDER — AMLODIPINE BESYLATE 5 MG PO TABS: 5.0000 mg | ORAL_TABLET | Freq: Once | ORAL | Status: DC

## 2022-11-24 MED ORDER — STROKE: EARLY STAGES OF RECOVERY BOOK: Freq: Once | Status: AC

## 2022-11-24 MED ORDER — METOPROLOL SUCCINATE ER 25 MG PO TB24
25.0000 mg | ORAL_TABLET | Freq: Every day | ORAL | Status: DC
  Administered 2022-11-24 – 2022-11-26 (×3): 25 mg via ORAL
  Filled 2022-11-24 (×4): qty 1

## 2022-11-24 MED ORDER — ASPIRIN 81 MG PO TBEC
81.0000 mg | DELAYED_RELEASE_TABLET | Freq: Every evening | ORAL | Status: DC
  Administered 2022-11-25: 81 mg via ORAL
  Filled 2022-11-24: qty 1

## 2022-11-24 MED ORDER — IOHEXOL 350 MG/ML SOLN
75.0000 mL | Freq: Once | INTRAVENOUS | Status: AC | PRN
  Administered 2022-11-24: 75 mL via INTRAVENOUS

## 2022-11-24 MED ORDER — ONDANSETRON HCL 4 MG/2ML IJ SOLN
4.0000 mg | Freq: Once | INTRAMUSCULAR | Status: AC
  Administered 2022-11-24: 4 mg via INTRAVENOUS
  Filled 2022-11-24 (×2): qty 2

## 2022-11-24 MED ORDER — LABETALOL HCL 5 MG/ML IV SOLN: 10.0000 mg | INTRAVENOUS | Status: DC | PRN

## 2022-11-24 MED ORDER — FOSFOMYCIN TROMETHAMINE 3 G PO PACK
3.0000 g | PACK | ORAL | Status: AC
  Administered 2022-11-24: 3 g via ORAL
  Filled 2022-11-24 (×2): qty 3

## 2022-11-24 MED ORDER — ATORVASTATIN CALCIUM 20 MG PO TABS
80.0000 mg | ORAL_TABLET | Freq: Every evening | ORAL | Status: DC
  Administered 2022-11-24 – 2022-11-25 (×2): 80 mg via ORAL
  Filled 2022-11-24 (×2): qty 4

## 2022-11-24 MED ORDER — INSULIN ASPART 100 UNIT/ML IJ SOLN
3.0000 [IU] | Freq: Three times a day (TID) | INTRAMUSCULAR | Status: DC
  Administered 2022-11-25 – 2022-11-26 (×5): 3 [IU] via SUBCUTANEOUS
  Filled 2022-11-24 (×5): qty 1

## 2022-11-24 MED ORDER — AMLODIPINE BESYLATE 10 MG PO TABS
10.0000 mg | ORAL_TABLET | Freq: Every evening | ORAL | Status: DC
  Administered 2022-11-24 – 2022-11-25 (×2): 10 mg via ORAL
  Filled 2022-11-24 (×2): qty 1

## 2022-11-24 MED ORDER — LORAZEPAM 2 MG/ML IJ SOLN
1.0000 mg | Freq: Once | INTRAMUSCULAR | Status: AC
  Administered 2022-11-24: 1 mg via INTRAVENOUS
  Filled 2022-11-24: qty 1

## 2022-11-24 MED ORDER — METOPROLOL TARTRATE 5 MG/5ML IV SOLN
5.0000 mg | Freq: Once | INTRAVENOUS | Status: AC
  Administered 2022-11-24: 5 mg via INTRAVENOUS
  Filled 2022-11-24: qty 5

## 2022-11-24 MED ORDER — SODIUM CHLORIDE 0.9 % IV SOLN
2.0000 g | INTRAVENOUS | Status: DC
  Administered 2022-11-24: 2 g via INTRAVENOUS
  Filled 2022-11-24: qty 20

## 2022-11-24 MED ORDER — ASPIRIN 81 MG PO CHEW
324.0000 mg | CHEWABLE_TABLET | Freq: Once | ORAL | Status: AC
  Administered 2022-11-24: 324 mg via ORAL
  Filled 2022-11-24 (×2): qty 4

## 2022-11-24 MED ORDER — CLOPIDOGREL BISULFATE 75 MG PO TABS
75.0000 mg | ORAL_TABLET | Freq: Every day | ORAL | Status: DC
  Administered 2022-11-25 – 2022-11-26 (×2): 75 mg via ORAL
  Filled 2022-11-24 (×2): qty 1

## 2022-11-24 MED ORDER — INSULIN ASPART 100 UNIT/ML IJ SOLN: 0.0000 [IU] | Freq: Every day | INTRAMUSCULAR | Status: DC

## 2022-11-24 MED ORDER — ENSURE ENLIVE PO LIQD
237.0000 mL | Freq: Two times a day (BID) | ORAL | Status: DC
  Administered 2022-11-25 – 2022-11-26 (×3): 237 mL via ORAL

## 2022-11-24 MED ORDER — ALPRAZOLAM 0.25 MG PO TABS
0.2500 mg | ORAL_TABLET | Freq: Three times a day (TID) | ORAL | Status: DC | PRN
  Filled 2022-11-24: qty 1

## 2022-11-24 NOTE — Assessment & Plan Note (Signed)
PPI ?

## 2022-11-24 NOTE — Assessment & Plan Note (Signed)
Lipid panel pending  Cont statin

## 2022-11-24 NOTE — ED Provider Notes (Signed)
[facility] Provider Note    Event Date/Time   First MD Initiated Contact with Patient 11/24/22 1129     (approximate)   History   Hypertension   HPI  Tami Jones is a 67 y.o. female with a history of previous stroke with mild right-sided deficits, chronic kidney disease, seizure disorder, diabetes  Patient reports that earlier this morning she noticed a feeling like a throbbing headache.  She also then developed a slight sense of chest pressure.  No nausea or vomiting.  She reports she felt like she was may be going to start to have a "panic attack" she took one of her Xanax this morning, but the symptoms did not abate.  She has a mild throbbing sensation in her head no vision change.  She does report that early this morning around 3 AM she had a tingly feeling in her left forearm and left hand but that is since nearly resolved though some tingling still in the Left hand.  She has been very mild but chronic weakness in the right arm and right leg from a prior stroke  She has been compliant with her medications.  She took her medicine this morning to including her metoprolol   No ongoing numbness or tingling.  Mild weakness right arm and right leg which is her normal.  Physical Exam   Triage Vital Signs: ED Triage Vitals  Encounter Vitals Group     BP 11/24/22 1128 (!) 186/102     Systolic BP Percentile --      Diastolic BP Percentile --      Pulse Rate 11/24/22 1128 (!) 104     Resp 11/24/22 1128 20     Temp 11/24/22 1128 98.5 F (36.9 C)     Temp Source 11/24/22 1128 Oral     SpO2 11/24/22 1128 98 %     Weight 11/24/22 1129 242 lb 11.2 oz (110.1 kg)     Height 11/24/22 1129 5\' 4"  (1.626 m)     Head Circumference --      Peak Flow --      Pain Score 11/24/22 1129 6     Pain Loc --      Pain Education --      Exclude from Growth Chart --     Most recent vital signs: Vitals:   11/24/22 1659 11/24/22 2000  BP: (!) 183/80 (!) 148/72  Pulse:  70 71  Resp: 19 18  Temp:  97.6 F (36.4 C)  SpO2: 100% 98%   EMS blood pressure as high as 230 systolic checked in both arms.   General: Awake, no distress.  Pleasant.  No photophobia.  Cranial nerve exam is normal.  Demonstrates 5 out of 5 strength in all extremities with normal sensation in all extremities and face.  Extraocular movements are normal.  She does have slight weakness in the right and left lower extremity when compared directly to her left but reports that this is her normal examination after a prior stroke CV:  Good peripheral perfusion.  Normal tones and rate. Resp:  Normal effort.  Clear bilateral Abd:  No distention.  Soft nontender nondistended Other:     ED Results / Procedures / Treatments   Labs (all labs ordered are listed, but only abnormal results are displayed) Labs Reviewed  BASIC METABOLIC PANEL - Abnormal; Notable for the following components:      Result Value   Glucose, Bld 213 (*)  Creatinine, Ser 1.12 (*)    Calcium 8.3 (*)    GFR, Estimated 55 (*)    All other components within normal limits  URINALYSIS, ROUTINE W REFLEX MICROSCOPIC - Abnormal; Notable for the following components:   Color, Urine YELLOW (*)    APPearance CLEAR (*)    Glucose, UA >=500 (*)    Leukocytes,Ua LARGE (*)    Bacteria, UA RARE (*)    All other components within normal limits  HEMOGLOBIN A1C - Abnormal; Notable for the following components:   Hgb A1c MFr Bld 7.4 (*)    All other components within normal limits  GLUCOSE, CAPILLARY - Abnormal; Notable for the following components:   Glucose-Capillary 167 (*)    All other components within normal limits  CBC  LIPID PANEL  TROPONIN I (HIGH SENSITIVITY)  TROPONIN I (HIGH SENSITIVITY)     EKG  Interpreted as sinus tachycardia   RADIOLOGY  CT head interpreted as negative for acute finding  CT Head Wo Contrast  Result Date: 11/24/2022 CLINICAL DATA:  Headache EXAM: CT HEAD WITHOUT CONTRAST TECHNIQUE:  Contiguous axial images were obtained from the base of the skull through the vertex without intravenous contrast. RADIATION DOSE REDUCTION: This exam was performed according to the departmental dose-optimization program which includes automated exposure control, adjustment of the mA and/or kV according to patient size and/or use of iterative reconstruction technique. COMPARISON:  03/26/2022 FINDINGS: Brain: No evidence of acute infarction, hemorrhage, hydrocephalus, extra-axial collection or mass lesion/mass effect. Subcortical white matter and periventricular small vessel ischemic changes. Vascular: Mild intracranial atherosclerosis. Skull: Normal. Negative for fracture or focal lesion. Sinuses/Orbits: The visualized paranasal sinuses are essentially clear. The mastoid air cells are unopacified. Other: None. IMPRESSION: No acute intracranial abnormality. Small vessel ischemic changes. Electronically Signed   By: Charline Bills M.D.   On: 11/24/2022 14:15      PROCEDURES:  Critical Care performed: Yes, see critical care procedure note(s)  CRITICAL CARE Performed by: Sharyn Creamer   Total critical care time: 25 minutes  Critical care time was exclusive of separately billable procedures and treating other patients.  Critical care was necessary to treat or prevent imminent or life-threatening deterioration.  Critical care was time spent personally by me on the following activities: development of treatment plan with patient and/or surrogate as well as nursing, discussions with consultants, evaluation of patient's response to treatment, examination of patient, obtaining history from patient or surrogate, ordering and performing treatments and interventions, ordering and review of laboratory studies, ordering and review of radiographic studies, pulse oximetry and re-evaluation of patient's condition.  Procedures   MEDICATIONS ORDERED IN ED: Medications  labetalol (NORMODYNE) injection 10 mg (has  no administration in time range)  cefTRIAXone (ROCEPHIN) 2 g in sodium chloride 0.9 % 100 mL IVPB (has no administration in time range)   stroke: early stages of recovery book (has no administration in time range)  amLODipine (NORVASC) tablet 10 mg (10 mg Oral Given 11/24/22 1751)  ALPRAZolam (XANAX) tablet 0.25 mg (has no administration in time range)  aspirin EC tablet 81 mg (has no administration in time range)  atorvastatin (LIPITOR) tablet 80 mg (80 mg Oral Given 11/24/22 1751)  clopidogrel (PLAVIX) tablet 75 mg (has no administration in time range)  metoprolol succinate (TOPROL-XL) 24 hr tablet 25 mg (25 mg Oral Given 11/24/22 1755)  pantoprazole (PROTONIX) EC tablet 40 mg (has no administration in time range)  insulin aspart (novoLOG) injection 0-15 Units (has no administration in time range)  insulin aspart (novoLOG) injection 0-5 Units ( Subcutaneous Not Given 11/24/22 2111)  insulin aspart (novoLOG) injection 3 Units (has no administration in time range)  ondansetron (ZOFRAN) injection 4 mg (has no administration in time range)  feeding supplement (ENSURE ENLIVE / ENSURE PLUS) liquid 237 mL (has no administration in time range)  metoprolol tartrate (LOPRESSOR) injection 5 mg (5 mg Intravenous Given 11/24/22 1134)  LORazepam (ATIVAN) injection 1 mg (1 mg Intravenous Given 11/24/22 1249)  aspirin chewable tablet 324 mg (324 mg Oral Given 11/24/22 1716)  fosfomycin (MONUROL) packet 3 g (3 g Oral Given 11/24/22 1750)  iohexol (OMNIPAQUE) 350 MG/ML injection 75 mL (75 mLs Intravenous Contrast Given 11/24/22 1731)     IMPRESSION / MDM / ASSESSMENT AND PLAN / ED COURSE  I reviewed the triage vital signs and the nursing notes.                              Differential diagnosis includes, but is not limited to, hypertensive emergency, hypertensive urgency, mild potential ischemic stroke with mild left hand paresthesia, carotid stenosis, coronary disease, etc.  The differential diagnosis quite  broad but she is severely hypertensive.  She did respond well to IV antihypertensive as well as IV anxiolysis..  Notes a mild persistent paresthesia of the left hand for which an MRI of the brain has been ordered.  Due to the severity of her hypertension especially that noted by EMS with blood pressure greater than 240 systolic as well as her mild ongoing symptomatology discussed case and care with Dr. Lonzo Cloud plan will be to admit to hospital service with her to care management    Patient's presentation is most consistent with acute presentation with potential threat to life or bodily function.   The patient is on the cardiac monitor to evaluate for evidence of arrhythmia and/or significant heart rate changes.  Patient understanding agreeable plan for admission  Abs notable for very mild hyper glycemia     FINAL CLINICAL IMPRESSION(S) / ED DIAGNOSES   Final diagnoses:  Hypertensive emergency  Paresthesia  Chest pressure     Rx / DC Orders   ED Discharge Orders     None        Note:  This document was prepared using Dragon voice recognition software and may include unintentional dictation errors.   Sharyn Creamer, MD 11/24/22 2140

## 2022-11-24 NOTE — ED Triage Notes (Signed)
Pt. To ED from home via EMS for feeling of general uneasiness this AM, pt. States she thought it was anxiety, but symptoms unrelieved with xanax. Denies pain, dizziness, or CP. EMS reports BP was 273/130 en route. Pt. States some chest pressure in triage after further discussion. Pt. Has residual right sided weakness from previous stroke. Pt. Has taken all BP meds a prescribed.

## 2022-11-24 NOTE — Assessment & Plan Note (Signed)
BP 180s-230s/70s-100s  Will allow for permissive HTN pending MRI brain to r/o CVA  Prn IV labetalol for SBP>220 or DBP>110  Otherwise titrate BP regimen

## 2022-11-24 NOTE — Assessment & Plan Note (Signed)
Baseline CVA w/ R sided weakness  Noted worsening R sided weakness with new L sided paresthesias since around 3am  On asa and plavix chronically per patient  Poorly controlled HTN is a confounding issue  CT head WNL  MRI brain pending  Will plan for formal CVA eval incuding CTA head and neck, 2D ECHO risk stratification labs  Cont asa and plavix  Neurology consult as clinically indicated

## 2022-11-24 NOTE — ED Notes (Addendum)
Pt's BP 169/73, pt. States she feels better. Urine malodorous. Dr. Fanny Bien notified.

## 2022-11-24 NOTE — H&P (Signed)
History and Physical    Patient: Tami Jones ZDG:[id number] DOB: Apr 29, 1955 DOA: 11/24/2022 DOS: the patient was seen and examined on 11/24/2022 PCP: Karie Schwalbe, MD  Patient coming from: Home  Chief Complaint:  Chief Complaint  Patient presents with   Hypertension   HPI: JAILEEN Jones is a 67 y.o. female with medical history significant of CVA, asthma, gerd, HLD presenting with weakness.  Noted baseline history of CVA with right-sided weakness chronically as well as hypertension is not well-controlled.  Patient reports having feeling of generalized uneasiness since around 3 AM today.  No frank chest pain, shortness of breath.  Minimal dizziness.  Noted right-sided weakness that seem to have worsened.  Also with left upper extremity paresthesias.  No slurred speech or confusion.  Patient states she took her blood pressure and it was very high at home.  Has been compliant with home medication regimen including Norvasc and metoprolol.  No alcohol or tobacco use.  Has been taking aspirin and Plavix chronically per patient.  No recent medication changes.  Positive reported dysuria.  No reports of fall or syncopal events.  Generalized weakness and paresthesias have persisted since this morning.  EMS subsequently called with blood pressure being noted to be in the 270s over 130s per report.  Repeat pressure with reading in the 200s over 100s. To the ER afebrile, blood pressures 160s to 230s over 70s to 100s.  Satting well on room air.  White count 6.6, hemoglobin 13.9, platelets 226, creatinine 1.12, glucose 213.  Urinalysis indicative of infection.  CT head within normal limits.  MRI brain pending.  EKG sinus tach with nonspecific ST and T wave changes. Review of Systems: As mentioned in the history of present illness. All other systems reviewed and are negative. Past Medical History:  Diagnosis Date   Anxiety    Asthma    CVA (cerebral vascular accident) (HCC) 03/2006   right occipital, Dr.  Thad Ranger   Diabetes mellitus    Expressive dysphasia 03/25/2022   GERD (gastroesophageal reflux disease)    Hyperlipidemia    Hypertension    Hypertensive urgency 03/25/2022   Past Surgical History:  Procedure Laterality Date   CARDIOVASCULAR STRESS TEST  1/15   myoview. EF 60%   CESAREAN SECTION     ESOPHAGOGASTRODUODENOSCOPY  05/2005   TONSILLECTOMY AND ADENOIDECTOMY     Social History:  reports that she has never smoked. She has never been exposed to tobacco smoke. She has never used smokeless tobacco. She reports that she does not drink alcohol and does not use drugs.  Allergies  Allergen Reactions   Citalopram Other (See Comments)    Feels odd   Doxycycline Hyclate Nausea And Vomiting   Erythromycin Other (See Comments)    Irritated stomach   Montelukast Sodium Itching   Nitrofurantoin Nausea And Vomiting   Sulfa Antibiotics Nausea And Vomiting   Tramadol Hcl Other (See Comments)    Feels odd   Venlafaxine Hcl Other (See Comments)    Feels odd   Amoxicillin-Pot Clavulanate Nausea And Vomiting   Clarithromycin Rash     See ER note 02/2017    Family History  Problem Relation Age of Onset   Coronary artery disease Mother    Diabetes Mellitus II Mother    Heart attack Mother 18   Diabetes Mellitus II Maternal Grandmother     Prior to Admission medications   Medication Sig Start Date End Date Taking? Authorizing Provider  acetaminophen (TYLENOL) 325 MG tablet  Take 650 mg by mouth every 6 (six) hours as needed.    [provider]  ALPRAZolam Prudy Feeler) 0.25 MG tablet Take 1 tablet (0.25 mg total) by mouth 3 (three) times daily as needed for anxiety or sleep. 12/24/20   Karie Schwalbe, MD  amLODipine (NORVASC) 10 MG tablet Take 1 tablet (10 mg total) by mouth every evening. 03/30/22   Karie Schwalbe, MD  aspirin EC 81 MG tablet Take 81 mg by mouth every evening.     [provider]  atorvastatin (LIPITOR) 80 MG tablet TAKE 1 TABLET BY MOUTH EVERY  EVENING 06/11/22   Tillman Abide I, MD  clopidogrel (PLAVIX) 75 MG tablet TAKE 1 TABLET BY MOUTH EVERY DAY 03/22/22   Karie Schwalbe, MD  glipiZIDE (GLUCOTROL XL) 5 MG 24 hr tablet Take 1 tablet (5 mg total) by mouth in the morning and at bedtime. 12/28/21   Tillman Abide I, MD  glucose blood test strip Use daily as instructed 06/12/22   Karie Schwalbe, MD  lidocaine (XYLOCAINE) 5 % ointment Apply 1 Application topically as needed. 10/03/22   Sharyn Creamer, MD  metoprolol succinate (TOPROL-XL) 25 MG 24 hr tablet Take 1 tablet (25 mg total) by mouth daily. 05/24/22   Karie Schwalbe, MD  nystatin ointment (MYCOSTATIN) Apply 1 Application topically 3 (three) times daily. 09/24/22   Irean Hong, MD  pantoprazole (PROTONIX) 40 MG tablet TAKE 1 TABLET BY MOUTH EVERY DAY 06/15/22   Karie Schwalbe, MD  phenazopyridine (PYRIDIUM) 200 MG tablet Take 1 tablet (200 mg total) by mouth 3 (three) times daily. Patient not taking: Reported on 11/24/2022 09/19/22   Garey Ham, [place]    Physical Exam: Vitals:   11/24/22 1345 11/24/22 1400 11/24/22 1415 11/24/22 1430  BP: (!) 162/87 (!) 169/85 (!) 164/89 (!) 176/84  Pulse: 90 86 78 78  Resp: 19 19 18 16   Temp:      TempSrc:      SpO2: 96% 97% 99% 100%  Weight:      Height:       Physical Exam Constitutional:      Appearance: She is obese.  HENT:     Head: Normocephalic and atraumatic.     Nose: Nose normal.     Mouth/Throat:     Mouth: Mucous membranes are moist.     Pharynx: Oropharynx is clear.  Eyes:     Pupils: Pupils are equal, round, and reactive to light.  Cardiovascular:     Rate and Rhythm: Normal rate and regular rhythm.  Pulmonary:     Effort: Pulmonary effort is normal.  Abdominal:     General: Abdomen is flat. Bowel sounds are normal.  Musculoskeletal:     Cervical back: Normal range of motion.     Comments: + R sided weakness 3-4/5 vs 5/5 on L   Skin:    General: Skin is warm.  Neurological:     Comments: +  R sided weakness  + mild paresthesias in LUE   Psychiatric:        Mood and Affect: Mood normal.     Data Reviewed:  There are no new results to review at this time.  CT Head Wo Contrast CLINICAL DATA:  Headache  EXAM: CT HEAD WITHOUT CONTRAST  TECHNIQUE: Contiguous axial images were obtained from the base of the skull through the vertex without intravenous contrast.  RADIATION DOSE REDUCTION: This exam was performed according to the departmental dose-optimization program which  includes automated exposure control, adjustment of the mA and/or kV according to patient size and/or use of iterative reconstruction technique.  COMPARISON:  03/26/2022  FINDINGS: Brain: No evidence of acute infarction, hemorrhage, hydrocephalus, extra-axial collection or mass lesion/mass effect.  Subcortical white matter and periventricular small vessel ischemic changes.  Vascular: Mild intracranial atherosclerosis.  Skull: Normal. Negative for fracture or focal lesion.  Sinuses/Orbits: The visualized paranasal sinuses are essentially clear. The mastoid air cells are unopacified.  Other: None.  IMPRESSION: No acute intracranial abnormality. Small vessel ischemic changes.  Electronically Signed   By: Charline Bills M.D.   On: 11/24/2022 14:15  Lab Results  Component Value Date   WBC 6.6 11/24/2022   HGB 13.9 11/24/2022   HCT 41.6 11/24/2022   MCV 92.0 11/24/2022   PLT 226 11/24/2022   Last metabolic panel Lab Results  Component Value Date   GLUCOSE 213 (H) 11/24/2022   NA 137 11/24/2022   K 3.6 11/24/2022   CL 100 11/24/2022   CO2 24 11/24/2022   BUN 21 11/24/2022   CREATININE 1.12 (H) 11/24/2022   GFRNONAA 55 (L) 11/24/2022   CALCIUM 8.3 (L) 11/24/2022   PHOS 3.3 12/28/2021   PROT 7.5 10/03/2022   ALBUMIN 4.0 10/03/2022   LABGLOB 2.5 11/27/2019   AGRATIO 1.7 11/27/2019   BILITOT 0.7 10/03/2022   ALKPHOS 78 10/03/2022   AST 17 10/03/2022   ALT 20 10/03/2022    ANIONGAP 13 11/24/2022    Assessment and Plan: Cerebrovascular disease Baseline CVA w/ R sided weakness  Noted worsening R sided weakness with new L sided paresthesias since around 3am  On asa and plavix chronically per patient  Poorly controlled HTN is a confounding issue  CT head WNL  MRI brain pending  Will plan for formal CVA eval incuding CTA head and neck, 2D ECHO risk stratification labs  Cont asa and plavix  Neurology consult as clinically indicated   UTI (urinary tract infection) + reported dysuria w/ UA indicative of infection  IV rocephin  Urine culture  Follow   Essential hypertension, benign BP 180s-230s/70s-100s  Will allow for permissive HTN pending MRI brain to r/o CVA  Prn IV labetalol for SBP>220 or DBP>110  Otherwise titrate BP regimen    Dyslipidemia Lipid panel pending  Cont statin    GERD PPI   Type 2 diabetes mellitus with other circulatory complications (HCC) SSI  A1C     Greater than 50% was spent in counseling and coordination of care with patient Total encounter time 80 minutes or more    Advance Care Planning:   Code Status: Full Code   Consults: Neurology as clinically indicated   Family Communication: No family at the bedside   Severity of Illness: The appropriate patient status for this patient is OBSERVATION. Observation status is judged to be reasonable and necessary in order to provide the required intensity of service to ensure the patient's safety. The patient's presenting symptoms, physical exam findings, and initial radiographic and laboratory data in the context of their medical condition is felt to place them at decreased risk for further clinical deterioration. Furthermore, it is anticipated that the patient will be medically stable for discharge from the hospital within 2 midnights of admission.   Author: Floydene Flock, MD 11/24/2022 4:49 PM  For on call review [email or web address].

## 2022-11-24 NOTE — ED Notes (Signed)
Pt. Up to toilet with assistance. NAD, gait slow and steady. Denies dizziness.

## 2022-11-24 NOTE — ED Notes (Signed)
Pt. In MRI.

## 2022-11-24 NOTE — ED Notes (Addendum)
This RN to bedside to answer call light. Pt. States she does not feel right, " I feel like a wave going over my body... it landed in my feet, and ankles and they hurt now." Pt. Also states, " I have this feeling in my left arm, it's not pain, kind of like numbness... I don't know what's going on, I don't feel right." EKG obtained. This RN updated MD on pt's current feeling and situation. See orders.

## 2022-11-24 NOTE — Assessment & Plan Note (Signed)
+   reported dysuria w/ UA indicative of infection  IV rocephin  Urine culture  Follow

## 2022-11-24 NOTE — Assessment & Plan Note (Signed)
SSI  A1C  

## 2022-11-24 NOTE — Plan of Care (Signed)
  Problem: Education: Goal: Knowledge of disease or condition will improve Outcome: Progressing Goal: Knowledge of secondary prevention will improve (MUST DOCUMENT ALL) Outcome: Progressing Goal: Knowledge of patient specific risk factors will improve Loraine Leriche N/A or DELETE if not current risk factor) Outcome: Progressing   Problem: Ischemic Stroke/TIA Tissue Perfusion: Goal: Complications of ischemic stroke/TIA will be minimized Outcome: Progressing   Problem: Coping: Goal: Will verbalize positive feelings about self Outcome: Progressing Goal: Will identify appropriate support needs Outcome: Progressing   Problem: Health Behavior/Discharge Planning: Goal: Ability to manage health-related needs will improve Outcome: Progressing Goal: Goals will be collaboratively established with patient/family Outcome: Progressing   Problem: Self-Care: Goal: Ability to participate in self-care as condition permits will improve Outcome: Progressing Goal: Verbalization of feelings and concerns over difficulty with self-care will improve Outcome: Progressing Goal: Ability to communicate needs accurately will improve Outcome: Progressing   Problem: Nutrition: Goal: Risk of aspiration will decrease Outcome: Progressing Goal: Dietary intake will improve Outcome: Progressing   Problem: Education: Goal: Knowledge of General Education information will improve Description: Including pain rating scale, medication(s)/side effects and non-pharmacologic comfort measures Outcome: Progressing   Problem: Health Behavior/Discharge Planning: Goal: Ability to manage health-related needs will improve Outcome: Progressing   Problem: Clinical Measurements: Goal: Ability to maintain clinical measurements within normal limits will improve Outcome: Progressing Goal: Will remain free from infection Outcome: Progressing Goal: Diagnostic test results will improve Outcome: Progressing Goal: Respiratory  complications will improve Outcome: Progressing Goal: Cardiovascular complication will be avoided Outcome: Progressing   Problem: Activity: Goal: Risk for activity intolerance will decrease Outcome: Progressing   Problem: Nutrition: Goal: Adequate nutrition will be maintained Outcome: Progressing   Problem: Coping: Goal: Level of anxiety will decrease Outcome: Progressing   Problem: Elimination: Goal: Will not experience complications related to bowel motility Outcome: Progressing Goal: Will not experience complications related to urinary retention Outcome: Progressing   Problem: Pain Managment: Goal: General experience of comfort will improve Outcome: Progressing   Problem: Safety: Goal: Ability to remain free from injury will improve Outcome: Progressing   Problem: Skin Integrity: Goal: Risk for impaired skin integrity will decrease Outcome: Progressing   Problem: Education: Goal: Ability to describe self-care measures that may prevent or decrease complications (Diabetes Survival Skills Education) will improve Outcome: Progressing Goal: Individualized Educational Video(s) Outcome: Progressing   Problem: Coping: Goal: Ability to adjust to condition or change in health will improve Outcome: Progressing   Problem: Fluid Volume: Goal: Ability to maintain a balanced intake and output will improve Outcome: Progressing   Problem: Health Behavior/Discharge Planning: Goal: Ability to identify and utilize available resources and services will improve Outcome: Progressing Goal: Ability to manage health-related needs will improve Outcome: Progressing   Problem: Metabolic: Goal: Ability to maintain appropriate glucose levels will improve Outcome: Progressing   Problem: Nutritional: Goal: Maintenance of adequate nutrition will improve Outcome: Progressing Goal: Progress toward achieving an optimal weight will improve Outcome: Progressing   Problem: Skin  Integrity: Goal: Risk for impaired skin integrity will decrease Outcome: Progressing   Problem: Tissue Perfusion: Goal: Adequacy of tissue perfusion will improve Outcome: Progressing

## 2022-11-25 ENCOUNTER — Inpatient Hospital Stay (HOSPITAL_COMMUNITY)
Admit: 2022-11-25 | Discharge: 2022-11-25 | Disposition: A | Discharge status: Home / Self Care | Payer: Medicare HMO | Attending: Family Medicine | Admitting: Family Medicine

## 2022-11-25 ENCOUNTER — Other Ambulatory Visit: Payer: Medicare HMO

## 2022-11-25 ENCOUNTER — Encounter: Payer: Self-pay | Admitting: Family Medicine

## 2022-11-25 DIAGNOSIS — R202 Paresthesia of skin: Secondary | ICD-10-CM | POA: Diagnosis not present

## 2022-11-25 DIAGNOSIS — I161 Hypertensive emergency: Secondary | ICD-10-CM

## 2022-11-25 DIAGNOSIS — G459 Transient cerebral ischemic attack, unspecified: Secondary | ICD-10-CM

## 2022-11-25 DIAGNOSIS — I679 Cerebrovascular disease, unspecified: Secondary | ICD-10-CM

## 2022-11-25 LAB — LIPID PANEL
Cholesterol: 211 mg/dL — ABNORMAL HIGH (ref 0–200)
HDL: 39 mg/dL — ABNORMAL LOW (ref >40)
LDL Cholesterol: 148 mg/dL — ABNORMAL HIGH (ref 0–99)
Total CHOL/HDL Ratio: 5.4 {ratio}
Triglycerides: 121 mg/dL (ref <150)
VLDL: 24 mg/dL (ref 0–40)

## 2022-11-25 LAB — ECHOCARDIOGRAM COMPLETE
AR max vel: 2.2 cm2
AV Area VTI: 2.92 cm2
AV Area mean vel: 2.24 cm2
AV Mean grad: 4 mm[Hg]
AV Peak grad: 6.2 mm[Hg]
Ao pk vel: 1.24 m/s
Area-P 1/2: 2.85 cm2
Height: 64 in
MV VTI: 3.18 cm2
S' Lateral: 2.9 cm
Weight: 3883.2 [oz_av]

## 2022-11-25 LAB — GLUCOSE, CAPILLARY
Glucose-Capillary: 150 mg/dL — ABNORMAL HIGH (ref 70–99)
Glucose-Capillary: 110 mg/dL — ABNORMAL HIGH (ref 70–99)
Glucose-Capillary: 125 mg/dL — ABNORMAL HIGH (ref 70–99)
Glucose-Capillary: 168 mg/dL — ABNORMAL HIGH (ref 70–99)

## 2022-11-25 MED ORDER — ONDANSETRON HCL 4 MG/2ML IJ SOLN
4.0000 mg | Freq: Four times a day (QID) | INTRAMUSCULAR | Status: DC | PRN
  Administered 2022-11-25: 4 mg via INTRAVENOUS

## 2022-11-25 MED ORDER — ACETAMINOPHEN 500 MG PO TABS
1000.0000 mg | ORAL_TABLET | Freq: Once | ORAL | Status: AC
  Administered 2022-11-25: 1000 mg via ORAL
  Filled 2022-11-25: qty 2

## 2022-11-25 NOTE — Plan of Care (Signed)
  Problem: Education: Goal: Knowledge of disease or condition will improve Outcome: Progressing Goal: Knowledge of secondary prevention will improve (MUST DOCUMENT ALL) Outcome: Progressing Goal: Knowledge of patient specific risk factors will improve Loraine Leriche N/A or DELETE if not current risk factor) Outcome: Progressing   Problem: Ischemic Stroke/TIA Tissue Perfusion: Goal: Complications of ischemic stroke/TIA will be minimized Outcome: Progressing   Problem: Coping: Goal: Will verbalize positive feelings about self Outcome: Progressing Goal: Will identify appropriate support needs Outcome: Progressing   Problem: Health Behavior/Discharge Planning: Goal: Ability to manage health-related needs will improve Outcome: Progressing Goal: Goals will be collaboratively established with patient/family Outcome: Progressing   Problem: Self-Care: Goal: Ability to participate in self-care as condition permits will improve Outcome: Progressing Goal: Verbalization of feelings and concerns over difficulty with self-care will improve Outcome: Progressing Goal: Ability to communicate needs accurately will improve Outcome: Progressing   Problem: Nutrition: Goal: Risk of aspiration will decrease Outcome: Progressing Goal: Dietary intake will improve Outcome: Progressing   Problem: Education: Goal: Knowledge of General Education information will improve Description: Including pain rating scale, medication(s)/side effects and non-pharmacologic comfort measures Outcome: Progressing   Problem: Health Behavior/Discharge Planning: Goal: Ability to manage health-related needs will improve Outcome: Progressing   Problem: Clinical Measurements: Goal: Ability to maintain clinical measurements within normal limits will improve Outcome: Progressing Goal: Will remain free from infection Outcome: Progressing Goal: Diagnostic test results will improve Outcome: Progressing Goal: Respiratory  complications will improve Outcome: Progressing Goal: Cardiovascular complication will be avoided Outcome: Progressing   Problem: Activity: Goal: Risk for activity intolerance will decrease Outcome: Progressing   Problem: Coping: Goal: Level of anxiety will decrease Outcome: Progressing   Problem: Elimination: Goal: Will not experience complications related to bowel motility Outcome: Progressing Goal: Will not experience complications related to urinary retention Outcome: Progressing   Problem: Pain Managment: Goal: General experience of comfort will improve Outcome: Progressing   Problem: Education: Goal: Ability to describe self-care measures that may prevent or decrease complications (Diabetes Survival Skills Education) will improve Outcome: Progressing Goal: Individualized Educational Video(s) Outcome: Progressing   Problem: Coping: Goal: Ability to adjust to condition or change in health will improve Outcome: Progressing   Problem: Fluid Volume: Goal: Ability to maintain a balanced intake and output will improve Outcome: Progressing   Problem: Health Behavior/Discharge Planning: Goal: Ability to identify and utilize available resources and services will improve Outcome: Progressing Goal: Ability to manage health-related needs will improve Outcome: Progressing   Problem: Metabolic: Goal: Ability to maintain appropriate glucose levels will improve Outcome: Progressing   Problem: Skin Integrity: Goal: Risk for impaired skin integrity will decrease Outcome: Progressing   Problem: Tissue Perfusion: Goal: Adequacy of tissue perfusion will improve Outcome: Progressing

## 2022-11-25 NOTE — Evaluation (Signed)
Occupational Therapy Evaluation Patient Details Name: Tami Jones MRN: [id number] DOB: 12-05-1955 Today's Date: 11/25/2022   History of Present Illness Pt is a 67 y.o. female presenting to hospital 11/24/22 with c/o HA and slight sense of chest pressure; earlier in morning had tingly feeling in L forearm and L hand but nearly resolved (some tingling still in L hand), minimal dizziness and nausea.  Pt admitted with cerebrovascular disease, UTI, and essential htn. MRI negative for acute CVA, multiple remote infarcts. PMHx of h/o stroke with R UE/LE weakness, CKD, seizure disorder, DM, anxiety, htn.   Clinical Impression   Pt was seen for OT evaluation this date. Prior to hospital admission, pt was living with her daughter in a one level home with 3 STE. Daughter works full time during the day and pt was IND with her ADL and some IADLs. Daughter managed meds and assisted with meals. Pt did drive and help care for 2 dogs.   Pt presents to acute OT demonstrating impaired ADL performance and functional mobility 2/2 slight weakness, numbness/tingling, and complains of nausea (See OT problem list for additional functional deficits). Pt currently requires SUP/SBA for STS from recliner, CGA/close SBA for mobility in her room using SPC. She performed oral hygiene in standing at sink with no UE support with good dynamic standing balance. LUE strength 4-/5, RUE strength 3+/5, but pt reports this is her baseline. She reports being at baseline with ADL function as well with no acute OT needs and no need for OT follow up upon DC.    If plan is discharge home, recommend the following: A little help with walking and/or transfers    Functional Status Assessment  Patient has not had a recent decline in their functional status  Equipment Recommendations  None recommended by OT    Recommendations for Other Services       Precautions / Restrictions Precautions Precautions: Fall Restrictions Weight Bearing  Restrictions: No      Mobility Bed Mobility Overal bed mobility: Modified Independent             General bed mobility comments: sit to supine with MOD I    Transfers Overall transfer level: Needs assistance Equipment used: Straight cane Transfers: Sit to/from Stand Sit to Stand: Supervision           General transfer comment: SUP for STS from recliner and CGA for mobility within the room to the bathroom sink      Balance Overall balance assessment: Needs assistance Sitting-balance support: No upper extremity supported, Feet supported Sitting balance-Leahy Scale: Normal     Standing balance support: No upper extremity supported Standing balance-Leahy Scale: Good Standing balance comment: steady performing oral hygiene at sink without UE support                           ADL either performed or assessed with clinical judgement   ADL Overall ADL's : Modified independent                                       General ADL Comments: MOD I with ADL care for oral hygiene in standing at sink, CGA for functional mobility to sink and bathroom     Vision         Perception         Praxis  Pertinent Vitals/Pain Pain Assessment Pain Assessment: No/denies pain (c/o nausea and sick on stomach)     Extremity/Trunk Assessment Upper Extremity Assessment Upper Extremity Assessment: Generalized weakness;Right hand dominant;RUE deficits/detail RUE Deficits / Details: previous CVA affecting R side, pt reports being at her baseline in regards to UB strength and has weights at home she uses   Lower Extremity Assessment Lower Extremity Assessment: RLE deficits/detail;LLE deficits/detail RLE Deficits / Details: hip flexion 3-/5; knee flexion/extension 4/5; DF 2+/5 LLE Deficits / Details: hip flexion 4/5; knee flexion/extension 4/5; DF 4-/5   Cervical / Trunk Assessment Cervical / Trunk Assessment: Other exceptions Cervical / Trunk  Exceptions: forward head/shoulders   Communication Communication Communication: No apparent difficulties Cueing Techniques: Verbal cues   Cognition Arousal: Alert Behavior During Therapy: WFL for tasks assessed/performed Overall Cognitive Status: Within Functional Limits for tasks assessed                                       General Comments  pt complaining of nausea    Exercises     Shoulder Instructions      Home Living Family/patient expects to be discharged to:: Private residence Living Arrangements: Children (daughter, does work during the day) Available Help at Discharge: Family;Available PRN/intermittently Type of Home: House Home Access: Stairs to enter [employer] of Steps: 3 from back (typical entrance) with L railing and use of SPC; 5 STE from front with B railings   Home Layout: One level     Bathroom Shower/Tub: Chief Strategy Officer: Standard     Home Equipment: Grab bars - tub/shower;Grab bars - toilet;Cane - single point;Rolling Walker (2 wheels)   Additional Comments: lives with her daughter who works during the day      Prior Functioning/Environment Prior Level of Function : Independent/Modified Independent             Mobility Comments: Ambulatory with SPC; occasionally goes out to store on own; (+) driving ADLs Comments: Has assist with meals from daughter, otherwise IND/MOD I and reports knowing her limitations        OT Problem List: Decreased strength      OT Treatment/Interventions:      OT Goals(Current goals can be found in the care plan section)    OT Frequency:      Co-evaluation              AM-PAC OT "6 Clicks" Daily Activity     Outcome Measure Help from another person eating meals?: None Help from another person taking care of personal grooming?: None Help from another person toileting, which includes using toliet, bedpan, or urinal?: A Little Help from another person  bathing (including washing, rinsing, drying)?: A Little Help from another person to put on and taking off regular upper body clothing?: None Help from another person to put on and taking off regular lower body clothing?: A Little 6 Click Score: 21   End of Session Equipment Utilized During Treatment: Rolling walker (2 wheels) Nurse Communication: Mobility status  Activity Tolerance: Patient tolerated treatment well Patient left: in bed;with call bell/phone within reach;with bed alarm set  OT Visit Diagnosis: Other abnormalities of gait and mobility (R26.89);Muscle weakness (generalized) (M62.81)                Time: 0936-1000 OT Time Calculation (min): 24 min Charges:  OT General Charges $OT Visit: 1 Visit OT  Evaluation $OT Eval Low Complexity: 1 Low OT Treatments $Self Care/Home Management : 8-22 mins  Jayln Branscom, OTR/L 11/25/22, 10:29 AM   Constance Goltz 11/25/2022, 10:25 AM

## 2022-11-25 NOTE — Progress Notes (Signed)
Patient alert and oriented, complained of nausea, zofran IV given x2 with improvement. Complained of bilateral leg pain, tylenol given with improvement. Will continue to monitor.

## 2022-11-25 NOTE — Progress Notes (Signed)
CROSS COVER NOTE  NAME: Tami Jones MRN: [id number] DOB : Aug 20, 1955    Concern as stated by nurse / staff   Message received nurse  patient diagnosed with TIA, she c/o pasthesia L arm and increased R sided weakness on admission. history significant of HTN, CVA with residual R-sided weakness, asthma, gerd, HLD. I am scoring her NIH 5 R leg drops to bed, some effort against gravity (says it is normal for her) R arm drift (says this is normal for her) R leg ataxia (normal for her) Partial hemianopia (from previous stroke). NEW onset dizziness, as of 1957, which she states has now mostly resolved. BP 163/80 HR 72 O2 sat 99% RA. CBG 168. Now She states the dizziness didn't start until after she drank an ensure, which was a little bit before I arrived on shift     Pertinent findings on chart review: Patient with history of CVA residual right sided weakness presented with TIA symptoms. Additional history hypertension, DM, hyperlipidemia, anxiety and anemia  She was also treated for vertigo in the past with meclizine. 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 Echo shows Grad I diastolic dysfunction, EF 55-60%   Assessment and  Interventions   Assessment:    11/25/2022    7:57 PM 11/25/2022    3:59 PM 11/25/2022    8:02 AM  Vitals with BMI  Systolic 163 142 191  Diastolic 80 71 72  Pulse 72 61 69      Latest Ref Rng & Units 11/24/2022   11:33 AM 11/08/2022    5:11 PM 10/03/2022    3:03 AM  BMP  Glucose 70 - 99 mg/dL 478  295  621   BUN 8 - 23 mg/dL 21  16  30    Creatinine 0.44 - 1.00 mg/dL 3.08  6.57  8.46   Sodium 135 - 145 mmol/L 137  139  138   Potassium 3.5 - 5.1 mmol/L 3.6  4.0  4.3   Chloride 98 - 111 mmol/L 100  104  103   CO2 22 - 32 mmol/L 24  25  25    Calcium 8.9 - 10.3 mg/dL 8.3  9.1  8.9    Ua without proteinuria HGB A1C 7.4 Plan:  Creatinine trend consistent with CKD III - no official nephrology work up found -  Patient would benefit from ACE or ARB for BP management and renal protection with DM and  outpatient nephrology referral If "dizziness persists - trial meclizine        Donnie Mesa NP [facility] Cross Cover 7pm-7am - check amion for availability Pager [phone]

## 2022-11-25 NOTE — Plan of Care (Signed)
  Problem: Education: Goal: Knowledge of disease or condition will improve Outcome: Progressing Goal: Knowledge of secondary prevention will improve (MUST DOCUMENT ALL) Outcome: Progressing Goal: Knowledge of patient specific risk factors will improve Loraine Leriche N/A or DELETE if not current risk factor) Outcome: Progressing   Problem: Ischemic Stroke/TIA Tissue Perfusion: Goal: Complications of ischemic stroke/TIA will be minimized Outcome: Progressing   Problem: Coping: Goal: Will verbalize positive feelings about self Outcome: Progressing Goal: Will identify appropriate support needs Outcome: Progressing   Problem: Health Behavior/Discharge Planning: Goal: Ability to manage health-related needs will improve Outcome: Progressing Goal: Goals will be collaboratively established with patient/family Outcome: Progressing   Problem: Self-Care: Goal: Ability to participate in self-care as condition permits will improve Outcome: Progressing Goal: Verbalization of feelings and concerns over difficulty with self-care will improve Outcome: Progressing Goal: Ability to communicate needs accurately will improve Outcome: Progressing   Problem: Nutrition: Goal: Risk of aspiration will decrease Outcome: Progressing Goal: Dietary intake will improve Outcome: Progressing   Problem: Education: Goal: Knowledge of General Education information will improve Description: Including pain rating scale, medication(s)/side effects and non-pharmacologic comfort measures Outcome: Progressing   Problem: Health Behavior/Discharge Planning: Goal: Ability to manage health-related needs will improve Outcome: Progressing   Problem: Clinical Measurements: Goal: Ability to maintain clinical measurements within normal limits will improve Outcome: Progressing Goal: Will remain free from infection Outcome: Progressing Goal: Diagnostic test results will improve Outcome: Progressing Goal: Respiratory  complications will improve Outcome: Progressing Goal: Cardiovascular complication will be avoided Outcome: Progressing   Problem: Activity: Goal: Risk for activity intolerance will decrease Outcome: Progressing   Problem: Nutrition: Goal: Adequate nutrition will be maintained Outcome: Progressing   Problem: Coping: Goal: Level of anxiety will decrease Outcome: Progressing   Problem: Elimination: Goal: Will not experience complications related to bowel motility Outcome: Progressing Goal: Will not experience complications related to urinary retention Outcome: Progressing   Problem: Pain Managment: Goal: General experience of comfort will improve Outcome: Progressing   Problem: Safety: Goal: Ability to remain free from injury will improve Outcome: Progressing   Problem: Skin Integrity: Goal: Risk for impaired skin integrity will decrease Outcome: Progressing   Problem: Education: Goal: Ability to describe self-care measures that may prevent or decrease complications (Diabetes Survival Skills Education) will improve Outcome: Progressing Goal: Individualized Educational Video(s) Outcome: Progressing   Problem: Coping: Goal: Ability to adjust to condition or change in health will improve Outcome: Progressing   Problem: Fluid Volume: Goal: Ability to maintain a balanced intake and output will improve Outcome: Progressing   Problem: Health Behavior/Discharge Planning: Goal: Ability to identify and utilize available resources and services will improve Outcome: Progressing Goal: Ability to manage health-related needs will improve Outcome: Progressing   Problem: Metabolic: Goal: Ability to maintain appropriate glucose levels will improve Outcome: Progressing   Problem: Nutritional: Goal: Maintenance of adequate nutrition will improve Outcome: Progressing Goal: Progress toward achieving an optimal weight will improve Outcome: Progressing   Problem: Skin  Integrity: Goal: Risk for impaired skin integrity will decrease Outcome: Progressing   Problem: Tissue Perfusion: Goal: Adequacy of tissue perfusion will improve Outcome: Progressing

## 2022-11-25 NOTE — Progress Notes (Signed)
*  PRELIMINARY RESULTS* Echocardiogram 2D Echocardiogram has been performed.  Cristela Blue 11/25/2022, 10:35 AM

## 2022-11-25 NOTE — Progress Notes (Signed)
PROGRESS NOTE  Tami Jones    DOB: Nov 06, 1955, 67 y.o.  ZOX:[id number]    Code Status: Full Code   DOA: 11/24/2022   LOS: 1   Brief hospital course  Tami Jones is a 67 y.o. female with medical history significant of HTN, CVA with residual R-sided weakness, asthma, gerd, HLD.  They presented with weakness. Noted right-sided weakness that seem to have worsened from baseline.  Also with left upper extremity paresthesias.  No slurred speech or confusion.  Patient states she took her blood pressure and it was very high at home.  Has been compliant with home medication regimen including Norvasc and metoprolol.   EMS noted BP 270s over 130s.  Repeat pressure with reading in the 200s over 100s. In the ER: afebrile, blood pressures 160s to 230s over 70s to 100s.  Satting well on room air.  White count 6.6, hemoglobin 13.9, platelets 226, creatinine 1.12, glucose 213.  Urinalysis indicative of infection.   CT head within normal limits.  MRI brain: 1. No evidence of acute intracranial abnormality. 2. Multiple remote infarcts. EKG sinus tach with nonspecific ST and T wave changes.  They were initially treated with antihypertensives, aspirin, statin, fosfomycin, CTX.   Patient was admitted to medicine service for further workup and management of weakness and UTI as outlined in detail below.  11/25/22 -improving  Assessment & Plan  Principal Problem:   TIA (transient ischemic attack) Active Problems:   Cerebrovascular disease   Essential hypertension, benign   UTI (urinary tract infection)   Dyslipidemia   Type 2 diabetes mellitus with other circulatory complications (HCC)   GERD  Cerebrovascular disease, no acute CVA but evidence of remote infarcts.  Baseline CVA w/ R sided weakness. High risk of disease with severe hypertension and evidenced stenosis of cerebral, vertebral arteries on CTA. MRI brain: 1. No evidence of acute intracranial abnormality. 2. Multiple remote infarcts. - ASA,  plavix, statin - PT/OT - risk reduction acts such as weight loss, cholesterol treatment, better blood pressure control  Cerebral and vertebral artery stenosis- of varying degrees including severe and occluded as seen on head/neck CTA.  1. 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 Diminutive but opacified more distal right MCA. 2. Similar severe stenosis of the right A1 ACA origin. 3. Similar moderate stenosis of the right cavernous ICA. 4. Similar moderate distal left M1 MCA stenosis. 5. Similar moderate basilar stenosis. Small bilateral PCAs, similar. 6. Similar occluded nondominant right vertebral artery origin stenosis. 7. Similar moderate to severe left V3 vertebral artery stenosis. - vascular surgery consulted. Appreciate your care   UTI-+ reported dysuria w/ UA indicative of infection. S/p fosfomycin and CTX. May have contributed to presenting weakness    Severe range hypertension- despite reported medication adherence.  BP 180s-230s/70s-100s.  - currently on metoprolol and amlodipine only with a PRN labetalol  - monitor closely    GERD PPI    Type 2 diabetes mellitus with other circulatory complications (HCC)- A1c 7.4 -sSSI     Body mass index is 41.66 kg/m.  VTE ppx: SCD  Diet:     Diet   Diet Carb Modified Fluid consistency: Thin; Room service appropriate? Yes   Consultants: Vascular surgery   Subjective 11/25/22    Pt reports having some nausea this morning prior to breakfast with 1 episode of vomiting. She was able to tolerate breakfast and is now feeling better. Not at baseline yet. Currently working with PT.    Objective  Vitals:   11/24/22 2344 11/24/22 2358 11/25/22 0411 11/25/22 0538  BP: (!) 115/57 128/65 138/63 (!) 154/77  Pulse: 72 74 66 64  Resp: 20 18 18 17   Temp: 97.6 F (36.4 C) 97.9 F (36.6 C) 97.9 F (36.6 C) (!) 97.4 F (36.3 C)  TempSrc:  Oral Oral Oral  SpO2: 98% 98% 99% 100%  Weight:       Height:        Intake/Output Summary (Last 24 hours) at 11/25/2022 0713 Last data filed at 11/25/2022 0450 Gross per 24 hour  Intake 100 ml  Output --  Net 100 ml   Filed Weights   11/24/22 1129  Weight: 110.1 kg    Physical Exam:  General: awake, alert, NAD HEENT: atraumatic, clear conjunctiva, anicteric sclera, MMM, hearing grossly normal Respiratory: normal respiratory effort. Cardiovascular: quick capillary refill, normal S1/S2, RRR, no JVD, murmurs Nervous: A&O x3. no new gross focal neurologic deficits, normal speech Extremities: no edema, normal tone Skin: dry, intact, normal temperature, normal color. No rashes, lesions or ulcers on exposed skin Psychiatry: normal mood, congruent affect  Labs   I have personally reviewed the following labs and imaging studies CBC    Component Value Date/Time   WBC 6.6 11/24/2022 1133   RBC 4.52 11/24/2022 1133   HGB 13.9 11/24/2022 1133   HGB 14.1 11/27/2019 0920   HCT 41.6 11/24/2022 1133   HCT 41.3 11/27/2019 0920   PLT 226 11/24/2022 1133   PLT 258 11/27/2019 0920   MCV 92.0 11/24/2022 1133   MCV 92 11/27/2019 0920   MCV 92 03/04/2013 1849   MCH 30.8 11/24/2022 1133   MCHC 33.4 11/24/2022 1133   RDW 12.4 11/24/2022 1133   RDW 12.4 11/27/2019 0920   RDW 12.5 03/04/2013 1849   LYMPHSABS 2.3 03/25/2022 0033   LYMPHSABS 1.8 02/17/2017 1300   MONOABS 0.6 03/25/2022 0033   EOSABS 0.1 03/25/2022 0033   EOSABS 0.1 02/17/2017 1300   BASOSABS 0.0 03/25/2022 0033   BASOSABS 0.0 02/17/2017 1300      Latest Ref Rng & Units 11/24/2022   11:33 AM 11/08/2022    5:11 PM 10/03/2022    3:03 AM  BMP  Glucose 70 - 99 mg/dL 409  811  914   BUN 8 - 23 mg/dL 21  16  30    Creatinine 0.44 - 1.00 mg/dL 7.82  9.56  2.13   Sodium 135 - 145 mmol/L 137  139  138   Potassium 3.5 - 5.1 mmol/L 3.6  4.0  4.3   Chloride 98 - 111 mmol/L 100  104  103   CO2 22 - 32 mmol/L 24  25  25    Calcium 8.9 - 10.3 mg/dL 8.3  9.1  8.9     MR BRAIN WO  CONTRAST  Result Date: 11/24/2022 CLINICAL DATA:  Neuro deficit, acute, stroke suspected EXAM: MRI HEAD WITHOUT CONTRAST TECHNIQUE: Multiplanar, multiecho pulse sequences of the brain and surrounding structures were obtained without intravenous contrast. COMPARISON:  Same day CT head.  MRI head March 25, 2022. FINDINGS: Brain: No acute infarction, hemorrhage, hydrocephalus, extra-axial collection or mass lesion. Small remote perforator infarcts in bilateral basal ganglia. Remote bilateral PCA territory infarcts. Patchy white matter T2/FLAIR hyperintensities are nonspecific but compatible with chronic microvascular ischemic disease. Vascular: Evaluated on same day CTA. Skull and upper cervical spine: Normal marrow signal. Sinuses/Orbits: Mild paranasal sinus mucosal thickening. No acute orbital findings. Other: No mastoid effusions. IMPRESSION: 1. No evidence of acute intracranial  abnormality. 2. Multiple remote infarcts. Electronically Signed   By: Feliberto Harts M.D.   On: 11/24/2022 18:08   CT ANGIO HEAD NECK W WO CM  Result Date: 11/24/2022 CLINICAL DATA:  Transient ischemic attack (TIA). EXAM: CT ANGIOGRAPHY HEAD AND NECK WITH AND WITHOUT CONTRAST TECHNIQUE: Multidetector CT imaging of the head and neck was performed using the standard protocol during bolus administration of intravenous contrast. Multiplanar CT image reconstructions and MIPs were obtained to evaluate the vascular anatomy. Carotid stenosis measurements (when applicable) are obtained utilizing NASCET criteria, using the distal internal carotid diameter as the denominator. RADIATION DOSE REDUCTION: This exam was performed according to the departmental dose-optimization program which includes automated exposure control, adjustment of the mA and/or kV according to patient size and/or use of iterative reconstruction technique. CONTRAST:  75mL OMNIPAQUE IOHEXOL 350 MG/ML SOLN COMPARISON:  CTA head/neck March 26, 2022. FINDINGS: CTA NECK  FINDINGS Aortic arch: Great vessel origins are patent without significant stenosis. Right carotid system: No evidence of dissection, stenosis (50% or greater), or occlusion. Retropharyngeal course. Left carotid system: No evidence of dissection, stenosis (50% or greater), or occlusion. Retropharyngeal course. Vertebral arteries: Diminutive right vertebral artery which appears occluded at its origin with intermittent or opacification more distally. Left vertebral artery is patent with moderate to severe stenosis at its V3 segment, similar. Findings are similar. Skeleton: No acute abnormality on limited assessment. Other neck: No acute abnormality on limited assessment. Upper chest: Visualized lung apices are clear. Review of the MIP images confirms the above findings CTA HEAD FINDINGS Anterior circulation: Bilateral intracranial ICAs are patent. Similar moderate stenosis of the right cavernous ICA. Diminutive but opacified more distal right MCA. 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 Similar severe stenosis of the right A1 ACA origin. Left MCA is opacified with moderate distal M1 MCA stenosis. Posterior circulation: Bilateral vertebral arteries are patent. Right vertebral artery is diminutive and terminates as PICA, anatomic variant. Moderate stenosis of the proximal left intradural vertebral artery. Similar diffuse irregularity of the basilar artery with moderate stenosis. Small bilateral PCAs are patent. Venous sinuses: Not well assessed due to arterial timing. No obvious dural venous sinus thrombosis. Review of the MIP images confirms the above findings IMPRESSION: 1. 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 Diminutive but opacified more distal right MCA. 2. Similar severe stenosis of the right A1 ACA origin. 3. Similar moderate stenosis of the right cavernous ICA. 4. Similar moderate distal left M1 MCA stenosis. 5. Similar moderate basilar  stenosis. Small bilateral PCAs, similar. 6. Similar occluded nondominant right vertebral artery origin stenosis. 7. Similar moderate to severe left V3 vertebral artery stenosis. Electronically Signed   By: Feliberto Harts M.D.   On: 11/24/2022 18:06   CT Head Wo Contrast  Result Date: 11/24/2022 CLINICAL DATA:  Headache EXAM: CT HEAD WITHOUT CONTRAST TECHNIQUE: Contiguous axial images were obtained from the base of the skull through the vertex without intravenous contrast. RADIATION DOSE REDUCTION: This exam was performed according to the departmental dose-optimization program which includes automated exposure control, adjustment of the mA and/or kV according to patient size and/or use of iterative reconstruction technique. COMPARISON:  03/26/2022 FINDINGS: Brain: No evidence of acute infarction, hemorrhage, hydrocephalus, extra-axial collection or mass lesion/mass effect. Subcortical white matter and periventricular small vessel ischemic changes. Vascular: Mild intracranial atherosclerosis. Skull: Normal. Negative for fracture or focal lesion. Sinuses/Orbits: The visualized paranasal sinuses are essentially clear. The mastoid air cells are unopacified. Other: None. IMPRESSION: No  acute intracranial abnormality. Small vessel ischemic changes. Electronically Signed   By: Charline Bills M.D.   On: 11/24/2022 14:15    Disposition Plan & Communication  Patient status: Inpatient  Admitted From: Home Planned disposition location: Home Anticipated discharge date: 10/12 pending vascular workup  Family Communication: none at bedside    Author: Leeroy Bock, DO [facility] 11/25/2022, 7:13 AM   Available by Epic secure chat 7AM-7PM. If 7PM-7AM, please contact night-coverage.  [facility] contact information found on [email or web address].

## 2022-11-25 NOTE — Consult Note (Signed)
Hospital Consult    Reason for Consult:  Altered Mental Status  Requesting Physician:  Dr Karsten Fells MD  MRN #:  [id number]  History of Present Illness: This is a 67 y.o. female  with a history of diabetes, hypertension, hyperlipidemia, CVA, asthma, and GERD who presents with turn for elevated blood pressure.  The patient states that since last night her blood pressure was over 200 systolic and over 100 diastolic.  Her daughter reports that she has occasional night terrors and will take 1/2 tablet of Xanax.  These happen up to a few times per month.  She had 1 of these episodes last night.  Sometimes her blood pressure will be elevated with this, but then will go back to normal.  However, since last night the blood pressure has remained elevated. She also had some chest pain which she associates with elevated blood pressure. She has had this before. Patient takes ASA 81 mg daily and Plavix 75 mg daily. Patient has noted right sided weakness which she endorses has worsened. She also endorses upper extremity paresthesias. Vascular Surgery Consulted to evaluate.   Past Medical History:  Diagnosis Date   Anxiety    Asthma    CVA (cerebral vascular accident) (HCC) 03/2006   right occipital, Dr. 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 EF 60%   CESAREAN SECTION     ESOPHAGOGASTRODUODENOSCOPY  05/2005   TONSILLECTOMY AND ADENOIDECTOMY      Allergies  Allergen Reactions   Citalopram Other (See Comments)    Feels odd   Doxycycline Hyclate Nausea And Vomiting   Erythromycin Other (See Comments)    Irritated stomach   Montelukast Sodium Itching   Nitrofurantoin Nausea And Vomiting   Sulfa Antibiotics Nausea And Vomiting   Tramadol Hcl Other (See  Comments)    Feels odd   Venlafaxine Hcl Other (See Comments)    Feels odd   Amoxicillin-Pot Clavulanate Nausea And Vomiting   Clarithromycin Rash     See ER note 02/2017    Prior to Admission medications   Medication Sig Start Date End Date Taking? Authorizing Provider  acetaminophen (TYLENOL) 325 MG tablet Take 650 mg by mouth every 6 (six) hours as needed.   Yes [provider]  ALPRAZolam (XANAX) 0.25 MG tablet Take 1 tablet (0.25 mg total) by mouth 3 (three) times daily as needed for anxiety or sleep. 12/24/20  Yes Karie Schwalbe, MD  amLODipine (NORVASC) 10 MG tablet Take 1 tablet (10 mg total) by mouth every evening. 03/30/22  Yes Tillman Abide I, MD  aspirin EC 81 MG tablet Take 81 mg by mouth every evening.    Yes [provider]  atorvastatin (LIPITOR) 80 MG tablet TAKE 1 TABLET BY MOUTH EVERY EVENING 06/11/22  Yes Karie Schwalbe, MD  clopidogrel (PLAVIX) 75 MG tablet TAKE 1 TABLET BY MOUTH EVERY DAY 03/22/22  Yes Karie Schwalbe, MD  glipiZIDE (GLUCOTROL XL) 5 MG 24 hr tablet Take 1 tablet (5 mg total) by mouth in the morning and at bedtime. 12/28/21  Yes Tillman Abide I, MD  lidocaine (XYLOCAINE) 5 % ointment Apply 1 Application topically as needed. 10/03/22  Yes Sharyn Creamer, MD  metoprolol succinate (TOPROL-XL) 25 MG 24 hr tablet  Take 1 tablet (25 mg total) by mouth daily. 05/24/22  Yes Karie Schwalbe, MD  gatifloxacin (ZYMAXID) 0.5 % SOLN Place 1 drop into the right eye 4 (four) times daily. 10/29/22   [provider]  glucose blood test strip Use daily as instructed 06/12/22   Karie Schwalbe, MD  ketorolac (ACULAR) 0.5 % ophthalmic solution Place 1 drop into the right eye 4 (four) times daily. 10/28/22   [provider]  nystatin ointment (MYCOSTATIN) Apply 1 Application topically 3 (three) times daily. Patient not taking: Reported on 11/24/2022 09/24/22   Irean Hong, MD  pantoprazole (PROTONIX) 40 MG tablet TAKE 1 TABLET BY MOUTH EVERY  DAY Patient not taking: Reported on 11/24/2022 06/15/22   Karie Schwalbe, MD  phenazopyridine (PYRIDIUM) 200 MG tablet Take 1 tablet (200 mg total) by mouth 3 (three) times daily. Patient not taking: Reported on 11/24/2022 09/19/22   Rodriguez-Southworth, Nettie Elm, PA-C  prednisoLONE acetate (PRED FORTE) 1 % ophthalmic suspension Place 1 drop into the right eye 4 (four) times daily. 10/28/22   [provider]    Social History   Socioeconomic History   Marital status: Divorced    Spouse name: Not on file   Number of children: 2   Years of education: Not on file   Highest education level: Not on file  Occupational History   Occupation: LAB TECH    Employer: [employer]  Tobacco Use   Smoking status: Never    Passive exposure: Never   Smokeless tobacco: Never  Vaping Use   Vaping status: Never Used  Substance and Sexual Activity   Alcohol use: No    Alcohol/week: 0.0 standard drinks of alcohol    Comment: wine occassionally   Drug use: No   Sexual activity: Yes    Birth control/protection: Condom  Other Topics Concern   Not on file  Social History Narrative   No living will   Daughter should be health care decision maker   Would accept resuscitation   No tube feeds if cognitively aware   Social Determinants of Health   Financial Resource Strain: High Risk (05/05/2022)   Overall Financial Resource Strain (CARDIA)    Difficulty of Paying Living Expenses: Hard  Food Insecurity: No Food Insecurity (11/24/2022)   Hunger Vital Sign    Worried About Running Out of Food in the Last Year: Never true    Ran Out of Food in the Last Year: Never true  Transportation Needs: No Transportation Needs (11/24/2022)   PRAPARE - Administrator, Civil Service (Medical): No    Lack of Transportation (Non-Medical): No  Physical Activity: Insufficiently Active (05/05/2022)   Exercise Vital Sign    Days of Exercise per Week: 4 days    Minutes of Exercise per Session: 20 min   Stress: Stress Concern Present (05/05/2022)   [employer] of Occupational Health - Occupational Stress Questionnaire    Feeling of Stress : To some extent  Social Connections: Moderately Isolated (05/05/2022)   Social Connection and Isolation Panel [NHANES]    Frequency of Communication with Friends and Family: More than three times a week    Frequency of Social Gatherings with Friends and Family: More than three times a week    Attends Religious Services: 1 to 4 times per year    Active Member of [employer] or Organizations: No    Attends Banker Meetings: Never    Marital Status: Divorced  Catering manager Violence: Not At  Risk (11/24/2022)   Humiliation, Afraid, Rape, and Kick questionnaire    Fear of Current or Ex-Partner: No    Emotionally Abused: No    Physically Abused: No    Sexually Abused: No     Family History  Problem Relation Age of Onset   Coronary artery disease Mother    Diabetes Mellitus II Mother    Heart attack Mother 62   Diabetes Mellitus II Maternal Grandmother     ROS: Otherwise negative unless mentioned in HPI  Physical Examination  Vitals:   11/25/22 0538 11/25/22 0802  BP: (!) 154/77 133/72  Pulse: 64 69  Resp: 17 14  Temp: (!) 97.4 F (36.3 C) 97.8 F (36.6 C)  SpO2: 100% 100%   Body mass index is 41.66 kg/m.  General:  WDWN in NAD Gait: Not observed HENT: WNL, normocephalic Pulmonary: normal non-labored breathing, without Rales, rhonchi,  wheezing Cardiac: regular, without  Murmurs, rubs or gallops; without carotid bruits Abdomen: Positive bowel sounds,  soft, NT/ND, no masses Skin: without rashes Vascular Exam/Pulses: Palpable pulses throughout upper and lower extremities.  Extremities: without ischemic changes, without Gangrene , without cellulitis; without open wounds;  Musculoskeletal: no muscle wasting or atrophy. 4/5 strength to her right side from prior CVA  Neurologic: A&O X 3;  No focal weakness or paresthesias  are detected; speech is fluent/normal Psychiatric:  The pt has Normal affect. Lymph:  Unremarkable  CBC    Component Value Date/Time   WBC 6.6 11/24/2022 1133   RBC 4.52 11/24/2022 1133   HGB 13.9 11/24/2022 1133   HGB 14.1 11/27/2019 0920   HCT 41.6 11/24/2022 1133   HCT 41.3 11/27/2019 0920   PLT 226 11/24/2022 1133   PLT 258 11/27/2019 0920   MCV 92.0 11/24/2022 1133   MCV 92 11/27/2019 0920   MCV 92 03/04/2013 1849   MCH 30.8 11/24/2022 1133   MCHC 33.4 11/24/2022 1133   RDW 12.4 11/24/2022 1133   RDW 12.4 11/27/2019 0920   RDW 12.5 03/04/2013 1849   LYMPHSABS 2.3 03/25/2022 0033   LYMPHSABS 1.8 02/17/2017 1300   MONOABS 0.6 03/25/2022 0033   EOSABS 0.1 03/25/2022 0033   EOSABS 0.1 02/17/2017 1300   BASOSABS 0.0 03/25/2022 0033   BASOSABS 0.0 02/17/2017 1300    BMET    Component Value Date/Time   NA 137 11/24/2022 1133   NA 139 11/27/2019 0920   NA 140 03/04/2013 1849   K 3.6 11/24/2022 1133   K 4.1 03/04/2013 1849   CL 100 11/24/2022 1133   CL 105 03/04/2013 1849   CO2 24 11/24/2022 1133   CO2 30 03/04/2013 1849   GLUCOSE 213 (H) 11/24/2022 1133   GLUCOSE 87 03/04/2013 1849   BUN 21 11/24/2022 1133   BUN 13 11/27/2019 0920   BUN 18 03/04/2013 1849   CREATININE 1.12 (H) 11/24/2022 1133   CREATININE 1.02 03/04/2013 1849   CALCIUM 8.3 (L) 11/24/2022 1133   CALCIUM 9.2 03/04/2013 1849   GFRNONAA 55 (L) 11/24/2022 1133   GFRNONAA >60 03/04/2013 1849   GFRAA 69 11/27/2019 0920   GFRAA >60 03/04/2013 1849    COAGS: Lab Results  Component Value Date   INR 0.9 03/25/2022   INR 0.9 08/25/2019   INR 0.9 06/09/2019     Non-Invasive Vascular Imaging:   EXAM:11/24/22 CT ANGIOGRAPHY HEAD AND NECK WITH AND WITHOUT CONTRAST   TECHNIQUE: Multidetector CT imaging of the head and neck was performed using the standard protocol during bolus administration of  intravenous contrast. Multiplanar CT image reconstructions and MIPs were obtained to evaluate the  vascular anatomy. Carotid stenosis measurements (when applicable) are obtained utilizing NASCET criteria, using the distal internal carotid diameter as the denominator.   RADIATION DOSE REDUCTION: This exam was performed according to the departmental dose-optimization program which includes automated exposure control, adjustment of the mA and/or kV according to patient size and/or use of iterative reconstruction technique.   CONTRAST:  75mL OMNIPAQUE IOHEXOL 350 MG/ML SOLN   COMPARISON:  CTA head/neck March 26, 2022.   FINDINGS: CTA NECK FINDINGS   Aortic arch: Great vessel origins are patent without significant stenosis.   Right carotid system: No evidence of dissection, stenosis (50% or greater), or occlusion. Retropharyngeal course.   Left carotid system: No evidence of dissection, stenosis (50% or greater), or occlusion. Retropharyngeal course.   Vertebral arteries: Diminutive right vertebral artery which appears occluded at its origin with intermittent or opacification more distally. Left vertebral artery is patent with moderate to severe stenosis at its V3 segment, similar. Findings are similar.   Skeleton: No acute abnormality on limited assessment.   Other neck: No acute abnormality on limited assessment.   Upper chest: Visualized lung apices are clear.   Review of the MIP images confirms the above findings   CTA HEAD FINDINGS   Anterior circulation: Bilateral intracranial ICAs are patent. Similar moderate stenosis of the right cavernous ICA. Diminutive but opacified more distal right MCA. 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 Similar severe stenosis of the right A1 ACA origin. Left MCA is opacified with moderate distal M1 MCA stenosis.   Posterior circulation: Bilateral vertebral arteries are patent. Right vertebral artery is diminutive and terminates as PICA, anatomic variant. Moderate stenosis of the proximal left  intradural vertebral artery. Similar diffuse irregularity of the basilar artery with moderate stenosis. Small bilateral PCAs are patent.   Venous sinuses: Not well assessed due to arterial timing. No obvious dural venous sinus thrombosis.   Review of the MIP images confirms the above findings   IMPRESSION: 1. 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 Diminutive but opacified more distal right MCA. 2. Similar severe stenosis of the right A1 ACA origin. 3. Similar moderate stenosis of the right cavernous ICA. 4. Similar moderate distal left M1 MCA stenosis. 5. Similar moderate basilar stenosis. Small bilateral PCAs, similar. 6. Similar occluded nondominant right vertebral artery origin stenosis. 7. Similar moderate to severe left V3 vertebral artery stenosis.  Statin:  Yes.   Beta Blocker:  Yes.   Aspirin:  Yes.   ACEI:  No. ARB:  No. CCB use:  Yes Other antiplatelets/anticoagulants:  Yes.   Plavix 75 mg Daily   ASSESSMENT/PLAN: This is a 67 y.o. female who presents to [facility] emergency department with chest pain and elevated blood pressures.  History of diabetes and hypertension with hyperlipidemia and prior CVA.  At times she states that she gets these tremors which he takes Xanax for when these episodes happen.  Patient noted that she had more right-sided weakness than normal with this episode and she endorses some upper extremity paresthesias.  Vascular surgery was consulted.  I had a detailed discussion with Dr. Festus Barren MD who has seen this patient multiple times.  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  At this time vascular surgery is not recommending an open procedure  or an endovascular procedure to help with the patient's symptoms.  Vascular  surgery recommends patient follow-up with her neurologist to [facility] for further outpatient workup.  Patient may benefit from an  endovascular neurosurgeon for possible procedure of her intracranial stenosis.   -I discussed the plan in detail with Dr. Festus Barren MD and he agrees with plan   Marcie Bal [facility] 11/25/2022 9:14 AM

## 2022-11-25 NOTE — TOC CM/SW Note (Signed)
Transition of Care [facility]) - Inpatient Brief Assessment   Patient Details  Name: Tami Jones MRN: [id number] Date of Birth: October 08, 1955  Transition of Care [facility]) CM/SW Contact:    Allena Katz, LCSW Phone Number: 11/25/2022, 10:38 AM   Clinical Narrative:    Transition of Care Asessment: Insurance and Status: Insurance coverage has been reviewed Patient has primary care physician: Yes Home environment has been reviewed: [id number] [place] [id number] Prior level of function:: no PT/OT needs Prior/Current Home Services: No current home services Social Determinants of Health Reivew: SDOH reviewed no interventions necessary Readmission risk has been reviewed: Yes Transition of care needs: no transition of care needs at this time

## 2022-11-25 NOTE — Progress Notes (Addendum)
SLP Cancellation Note  Patient Details Name: DESSIRE GRIMES MRN: [id number] DOB: 29-Feb-1955   Cancelled treatment:       Reason Eval/Treat Not Completed: SLP screened, no needs identified, will sign off (chart reviewed; consulted NSG then met w/pt in room.)    MRI: No evidence of acute intracranial abnormality. 2. Multiple remote infarcts including bilateral PCA territory infarcts; small vessel ischemic changes.   Pt denied any difficulty swallowing and is currently on a regular diet; tolerates swallowing pills w/ water per NSG. Pt continued to feed self bites/sips of her lunch meal during the visit w/out difficulty. Pt conversed in general conversation w/out overt expressive/receptive deficits noted; pt described having an episode of nausea when taking Tylenol on an empty stomach this morning. Discussed problem-solving of this and pt stated she "will eat something first like I do at home". Pt endorsed she was on "Protonix for indigestion" at baseline. Pt denied any speech-language deficits. Speech clear. No further skilled ST services indicated as pt appears at her communication baseline. Per OT note, "Daughter managed meds and assisted with meals.". Recommend Supervision w/ ADLs as indicated. Pt agreed. NSG to reconsult if any change in status while admitted.      Jerilynn Som, MS, CCC-SLP Speech Language Pathologist Rehab Services; [facility] [phone] (ascom) Arabella Revelle 11/25/2022, 12:07 PM

## 2022-11-25 NOTE — Evaluation (Signed)
Physical Therapy Evaluation Patient Details Name: Tami Jones MRN: [id number] DOB: 1955/08/22 Today's Date: 11/25/2022  History of Present Illness  Pt is a 67 y.o. female presenting to hospital 11/24/22 with c/o HA and slight sense of chest pressure; earlier in morning had tingly feeling in L forearm and L hand but nearly resolved (some tingling still in L hand).  Pt admitted with cerebrovascular disease, UTI, and essential htn.  PMH includes h/o stroke with R UE/LE weakness, CKD, seizure disorder, DM, anxiety, htn.  Clinical Impression  Prior to recent medical concerns, pt reports being modified independent with ambulation using SPC; lives with her daughter in 1 level home with steps to enter.  Pt reporting no pain during session.  Currently pt is modified independent semi-supine to sitting EOB; SBA with transfers; and CGA with ambulation 120 feet with SPC use.  Altered gait mechanics noted (pt with h/o stroke with R sided weakness)--pt reports being close to baseline level of functional mobility.  No loss of balance noted during sessions activities.  Pt would currently benefit from skilled PT to address noted impairments and functional limitations (see below for any additional details).  Upon hospital discharge, pt would benefit from ongoing therapy (pt declining further therapy upon hospital discharge).     If plan is discharge home, recommend the following: A little help with walking and/or transfers;A little help with bathing/dressing/bathroom;Assistance with cooking/housework;Assist for transportation;Help with stairs or ramp for entrance   Can travel by private vehicle    Yes    Equipment Recommendations Cane (Pt has SPC at home already)  Recommendations for Other Services       Functional Status Assessment Patient has had a recent decline in their functional status and demonstrates the ability to make significant improvements in function in a reasonable and predictable amount of time.      Precautions / Restrictions Precautions Precautions: Fall Restrictions Weight Bearing Restrictions: No      Mobility  Bed Mobility Overal bed mobility: Modified Independent             General bed mobility comments: Semi-supine to sitting without any noted difficulties    Transfers Overall transfer level: Needs assistance Equipment used: Straight cane Transfers: Sit to/from Stand Sit to Stand: Supervision           General transfer comment: x1 trial standing from bed and x1 trial standing from toilet; steady; mild increased effort to stand on own    Ambulation/Gait Ambulation/Gait assistance: Contact guard assist Gait Distance (Feet): 120 Feet Assistive device: Straight cane Gait Pattern/deviations: Step-through pattern, Decreased step length - right, Decreased step length - left Gait velocity: decreased     General Gait Details: decreased stance time R LE; mild increased R lateral sway; no loss of balance noted  Stairs            Wheelchair Mobility     Tilt Bed    Modified Rankin (Stroke Patients Only)       Balance Overall balance assessment: Needs assistance Sitting-balance support: No upper extremity supported, Feet supported Sitting balance-Leahy Scale: Good Sitting balance - Comments: steady reaching outside BOS   Standing balance support: No upper extremity supported Standing balance-Leahy Scale: Good Standing balance comment: steady washing hands at sink                             Pertinent Vitals/Pain Pain Assessment Pain Assessment: No/denies pain Vitals (HR and SpO2 sats on  room air) stable and WFL throughout treatment session.    Home Living Family/patient expects to be discharged to:: Private residence Living Arrangements: Children (Pt's daughter) Available Help at Discharge: Family;Available PRN/intermittently (Pt's daughter works full time) Type of Home: House Home Access: Stairs to enter   [employer] of Steps: 3 from back (typical entrance) with L railing and use of SPC; 5 STE from front with B railings   Home Layout: One level Home Equipment: Grab bars - tub/shower;Grab bars - toilet;Cane - single point;Rolling Walker (2 wheels)      Prior Function Prior Level of Function : Independent/Modified Independent             Mobility Comments: Ambulatory with SPC; occasionally goes out to store on own; (+) driving ADLs Comments: Has assist with meals.     Extremity/Trunk Assessment   Upper Extremity Assessment Upper Extremity Assessment: Defer to OT evaluation    Lower Extremity Assessment Lower Extremity Assessment: RLE deficits/detail;LLE deficits/detail RLE Deficits / Details: hip flexion 3-/5; knee flexion/extension 4/5; DF 2+/5 LLE Deficits / Details: hip flexion 4/5; knee flexion/extension 4/5; DF 4-/5    Cervical / Trunk Assessment Cervical / Trunk Assessment: Other exceptions Cervical / Trunk Exceptions: forward head/shoulders  Communication   Communication Communication: No apparent difficulties Cueing Techniques: Verbal cues  Cognition Arousal: Alert Behavior During Therapy: WFL for tasks assessed/performed Overall Cognitive Status: Within Functional Limits for tasks assessed                                          General Comments  Nursing cleared pt for participation in physical therapy.  Pt agreeable to PT session.    Exercises     Assessment/Plan    PT Assessment Patient needs continued PT services  PT Problem List Decreased strength;Decreased mobility       PT Treatment Interventions DME instruction;Gait training;Stair training;Functional mobility training;Therapeutic activities;Therapeutic exercise;Balance training;Patient/family education    PT Goals (Current goals can be found in the Care Plan section)  Acute Rehab PT Goals Patient Stated Goal: to increase mobility PT Goal Formulation: With patient Time For  Goal Achievement: 12/09/22 Potential to Achieve Goals: Good    Frequency Min 1X/week     Co-evaluation               AM-PAC PT "6 Clicks" Mobility  Outcome Measure Help needed turning from your back to your side while in a flat bed without using bedrails?: None Help needed moving from lying on your back to sitting on the side of a flat bed without using bedrails?: None Help needed moving to and from a bed to a chair (including a wheelchair)?: A Little Help needed standing up from a chair using your arms (e.g., wheelchair or bedside chair)?: A Little Help needed to walk in hospital room?: A Little Help needed climbing 3-5 steps with a railing? : A Little 6 Click Score: 20    End of Session Equipment Utilized During Treatment: Gait belt Activity Tolerance: Patient tolerated treatment well Patient left: in chair;with call bell/phone within reach;Other (comment) (OT present for OT session (OT to set pt up when finished)) Nurse Communication: Mobility status;Precautions PT Visit Diagnosis: Other abnormalities of gait and mobility (R26.89);Muscle weakness (generalized) (M62.81)    Time: 4098-1191 PT Time Calculation (min) (ACUTE ONLY): 25 min   Charges:   PT Evaluation $PT Eval Low Complexity: 1  Low PT Treatments $Therapeutic Activity: 8-22 mins PT General Charges $$ ACUTE PT VISIT: 1 Visit        Hendricks Limes, PT 11/25/22, 9:55 AM

## 2022-11-26 DIAGNOSIS — I161 Hypertensive emergency: Secondary | ICD-10-CM | POA: Diagnosis not present

## 2022-11-26 DIAGNOSIS — G459 Transient cerebral ischemic attack, unspecified: Secondary | ICD-10-CM | POA: Diagnosis not present

## 2022-11-26 LAB — CBC
HCT: 39.6 % (ref 36.0–46.0)
Hemoglobin: 13.4 g/dL (ref 12.0–15.0)
MCH: 31.2 pg (ref 26.0–34.0)
MCHC: 33.8 g/dL (ref 30.0–36.0)
MCV: 92.3 fL (ref 80.0–100.0)
Platelets: 236 10*3/uL (ref 150–400)
RBC: 4.29 MIL/uL (ref 3.87–5.11)
RDW: 12.4 % (ref 11.5–15.5)
WBC: 5.8 10*3/uL (ref 4.0–10.5)
nRBC: 0 % (ref 0.0–0.2)

## 2022-11-26 LAB — BASIC METABOLIC PANEL
Anion gap: 11 (ref 5–15)
BUN: 22 mg/dL (ref 8–23)
CO2: 25 mmol/L (ref 22–32)
Calcium: 8.9 mg/dL (ref 8.9–10.3)
Chloride: 102 mmol/L (ref 98–111)
Creatinine, Ser: 1.19 mg/dL — ABNORMAL HIGH (ref 0.44–1.00)
GFR, Estimated: 51 mL/min — ABNORMAL LOW (ref >60)
Glucose, Bld: 183 mg/dL — ABNORMAL HIGH (ref 70–99)
Potassium: 4 mmol/L (ref 3.5–5.1)
Sodium: 138 mmol/L (ref 135–145)

## 2022-11-26 LAB — GLUCOSE, CAPILLARY
Glucose-Capillary: 135 mg/dL — ABNORMAL HIGH (ref 70–99)
Glucose-Capillary: 159 mg/dL — ABNORMAL HIGH (ref 70–99)

## 2022-11-26 MED ORDER — METOPROLOL SUCCINATE ER 50 MG PO TB24: 50.0000 mg | ORAL_TABLET | Freq: Every day | ORAL | 0 refills | Status: DC

## 2022-11-26 MED ORDER — VALSARTAN 40 MG PO TABS: 40.0000 mg | ORAL_TABLET | Freq: Every day | ORAL | 0 refills | Status: DC

## 2022-11-26 MED ORDER — METOPROLOL SUCCINATE ER 25 MG PO TB24: 25.0000 mg | ORAL_TABLET | Freq: Every day | ORAL | Status: DC

## 2022-11-26 MED ORDER — FLUCONAZOLE 50 MG PO TABS
150.0000 mg | ORAL_TABLET | Freq: Once | ORAL | Status: AC
  Administered 2022-11-26: 150 mg via ORAL
  Filled 2022-11-26: qty 1

## 2022-11-26 NOTE — Discharge Instructions (Signed)
Continue your home blood pressure medications. I have also added an additional blood pressure medication called valsartan to help get better control of your pressures and prevent the severe ranges you saw prior to coming to the hospital.  Please make an appointment with your primary doctor within a week to recheck your blood pressure and blood work.  I placed a referral to neurology. I would like you to establish care with them to help discuss how you can reduce your risk of strokes in the future.

## 2022-11-26 NOTE — Discharge Summary (Addendum)
Physician Discharge Summary  Patient: Tami Jones MVH:[id number] DOB: 1955/07/25   Code Status: Prior Admit date: 11/24/2022 Discharge date: 11/26/2022 Disposition: Home, No home health services recommended PCP: Karie Schwalbe, MD  Recommendations for Outpatient Follow-up:  Follow up with PCP within 1-2 weeks Regarding general hospital follow up and preventative care Recommend blood pressure monitoring and medication modifications as needed follow up with neurologist- Theora Master and possible endovascular neurosurgeon referral for progressive cerebral artery stenosis.   Discharge Diagnoses:  Principal Problem:   TIA (transient ischemic attack) Active Problems:   Cerebrovascular disease   Essential hypertension, benign   UTI (urinary tract infection)   Dyslipidemia   Type 2 diabetes mellitus with other circulatory complications (HCC)   GERD   Hypertensive emergency   Paresthesia  Brief Hospital Course Summary: Tami Jones is a 67 y.o. female with medical history significant of HTN, CVA with residual R-sided weakness, asthma, gerd, HLD.   They presented with weakness. Noted right-sided weakness that seem to have worsened from baseline.  Also with left upper extremity paresthesias.  No slurred speech or confusion.  Patient states she took her blood pressure and it was very high at home.  Has been compliant with home medication regimen including Norvasc and metoprolol.   EMS noted BP 270s over 130s.  Repeat pressure with reading in the 200s over 100s. In the ER: afebrile, blood pressures 160s to 230s over 70s to 100s.  Satting well on room air.  White count 6.6, hemoglobin 13.9, platelets 226, creatinine 1.12, glucose 213.  Urinalysis indicative of infection.   CT head within normal limits.  MRI brain: 1. No evidence of acute intracranial abnormality. 2. Multiple remote infarcts. EKG sinus tach with nonspecific ST and T wave changes.   They were initially treated with  antihypertensives, aspirin, statin, fosfomycin, CTX.    Patient was admitted to medicine service for further workup and management of weakness and UTI as outlined in detail below.   Concern for CVA was ruled out with MRI brain: 1. No evidence of acute intracranial abnormality. 2. Multiple remote infarcts. Her symptoms resolved. Head and neck CT showed significant progression of vascular stenosis which vascular surgery reviewed and did not recommend intervention on their part due to the location within cerebrum. Recommended she follow up with neurologist- Theora Master and possible endovascular neurosurgeon referral.   Severe range blood pressure were moderately well controlled on her home regimen but due to borderline bradycardia and SBP 140-170s, she was additionally prescribed valsartan.   Discharge Condition: Stable, improved Recommended discharge diet: Cardiac diet  Consultations: Vascular surgery   Procedures/Studies: Brain MRI  Discharge Instructions     Ambulatory referral to Neurology   Complete by: As directed    An appointment is requested in approximately: 2 weeks Regarding TIA, stroke prevention, significant intracranial arterial stenosis. Seen by vascular surgery who recommended evaluation by neurosurgery vascular specialist for intervention      Allergies as of 11/26/2022       Reactions   Citalopram Other (See Comments)   Feels odd   Doxycycline Hyclate Nausea And Vomiting   Erythromycin Other (See Comments)   Irritated stomach   Montelukast Sodium Itching   Nitrofurantoin Nausea And Vomiting   Sulfa Antibiotics Nausea And Vomiting   Tramadol Hcl Other (See Comments)   Feels odd   Venlafaxine Hcl Other (See Comments)   Feels odd   Amoxicillin-pot Clavulanate Nausea And Vomiting   Clarithromycin Rash    See  Date of Exam: 11/25/2022 Medical Rec #:  [id number]      Height:       64.0 in Accession #:    [id number]     Weight:       242.7 lb Date of Birth:  06/28/1955      BSA:          2.124 m Patient Age:    67 years       BP:           133/72 mmHg Patient Gender: F              HR:           69 bpm. Exam Location:  [facility] Procedure: 2D Echo, Cardiac Doppler and Color Doppler Indications:     TIA G45.9  History:         Patient has prior history of Echocardiogram examinations, most                  recent 03/25/2022. Risk Factors:Hypertension and Diabetes. CVA.  Sonographer:     Cristela Blue Referring Phys:  [id number] Francoise Schaumann [place] Diagnosing Phys: Julien Nordmann MD IMPRESSIONS  1. Left ventricular ejection fraction, by estimation, is 55 to 60%. The left ventricle has normal function. The left ventricle has no  regional wall motion abnormalities. Left ventricular diastolic parameters are consistent with Grade I diastolic dysfunction (impaired relaxation).  2. Right ventricular systolic function is normal. The right ventricular size is normal. There is normal pulmonary artery systolic pressure. The estimated right ventricular systolic pressure is 24.0 mmHg.  3. The mitral valve is normal in structure. No evidence of mitral valve regurgitation. No evidence of mitral stenosis.  4. The aortic valve has an indeterminant number of cusps. Aortic valve regurgitation is not visualized. No aortic stenosis is present.  5. The inferior vena cava is normal in size with greater than 50% respiratory variability, suggesting right atrial pressure of 3 mmHg. FINDINGS  Left Ventricle: Left ventricular ejection fraction, by estimation, is 55 to 60%. The left ventricle has normal function. The left ventricle has no regional wall motion abnormalities. The left ventricular internal cavity size was normal in size. There is  no left ventricular hypertrophy. Left ventricular diastolic parameters are consistent with Grade I diastolic dysfunction (impaired relaxation). Right Ventricle: The right ventricular size is normal. No increase in right ventricular wall thickness. Right ventricular systolic function is normal. There is normal pulmonary artery systolic pressure. The tricuspid regurgitant velocity is 2.18 m/s, and  with an assumed right atrial pressure of 5 mmHg, the estimated right ventricular systolic pressure is 24.0 mmHg. Left Atrium: Left atrial size was normal in size. Right Atrium: Right atrial size was normal in size. Pericardium: There is no evidence of pericardial effusion. Mitral Valve: The mitral valve is normal in structure. No evidence of mitral valve regurgitation. No evidence of mitral valve stenosis. MV peak gradient, 3.5 mmHg. The mean mitral valve gradient is 1.0 mmHg. Tricuspid Valve: The tricuspid valve is normal in  structure. Tricuspid valve regurgitation is not demonstrated. No evidence of tricuspid stenosis. Aortic Valve: The aortic valve has an indeterminant number of cusps. Aortic valve regurgitation is not visualized. No aortic stenosis is present. Aortic valve mean gradient measures 4.0 mmHg. Aortic valve peak gradient measures 6.2 mmHg. Aortic valve area, by VTI measures 2.92 cm. Pulmonic Valve: The pulmonic valve was normal in structure. Pulmonic valve regurgitation is not visualized. No evidence of pulmonic stenosis. Aorta: The aortic root  Physician Discharge Summary  Patient: Tami Jones MVH:[id number] DOB: 1955/07/25   Code Status: Prior Admit date: 11/24/2022 Discharge date: 11/26/2022 Disposition: Home, No home health services recommended PCP: Karie Schwalbe, MD  Recommendations for Outpatient Follow-up:  Follow up with PCP within 1-2 weeks Regarding general hospital follow up and preventative care Recommend blood pressure monitoring and medication modifications as needed follow up with neurologist- Theora Master and possible endovascular neurosurgeon referral for progressive cerebral artery stenosis.   Discharge Diagnoses:  Principal Problem:   TIA (transient ischemic attack) Active Problems:   Cerebrovascular disease   Essential hypertension, benign   UTI (urinary tract infection)   Dyslipidemia   Type 2 diabetes mellitus with other circulatory complications (HCC)   GERD   Hypertensive emergency   Paresthesia  Brief Hospital Course Summary: Tami Jones is a 67 y.o. female with medical history significant of HTN, CVA with residual R-sided weakness, asthma, gerd, HLD.   They presented with weakness. Noted right-sided weakness that seem to have worsened from baseline.  Also with left upper extremity paresthesias.  No slurred speech or confusion.  Patient states she took her blood pressure and it was very high at home.  Has been compliant with home medication regimen including Norvasc and metoprolol.   EMS noted BP 270s over 130s.  Repeat pressure with reading in the 200s over 100s. In the ER: afebrile, blood pressures 160s to 230s over 70s to 100s.  Satting well on room air.  White count 6.6, hemoglobin 13.9, platelets 226, creatinine 1.12, glucose 213.  Urinalysis indicative of infection.   CT head within normal limits.  MRI brain: 1. No evidence of acute intracranial abnormality. 2. Multiple remote infarcts. EKG sinus tach with nonspecific ST and T wave changes.   They were initially treated with  antihypertensives, aspirin, statin, fosfomycin, CTX.    Patient was admitted to medicine service for further workup and management of weakness and UTI as outlined in detail below.   Concern for CVA was ruled out with MRI brain: 1. No evidence of acute intracranial abnormality. 2. Multiple remote infarcts. Her symptoms resolved. Head and neck CT showed significant progression of vascular stenosis which vascular surgery reviewed and did not recommend intervention on their part due to the location within cerebrum. Recommended she follow up with neurologist- Theora Master and possible endovascular neurosurgeon referral.   Severe range blood pressure were moderately well controlled on her home regimen but due to borderline bradycardia and SBP 140-170s, she was additionally prescribed valsartan.   Discharge Condition: Stable, improved Recommended discharge diet: Cardiac diet  Consultations: Vascular surgery   Procedures/Studies: Brain MRI  Discharge Instructions     Ambulatory referral to Neurology   Complete by: As directed    An appointment is requested in approximately: 2 weeks Regarding TIA, stroke prevention, significant intracranial arterial stenosis. Seen by vascular surgery who recommended evaluation by neurosurgery vascular specialist for intervention      Allergies as of 11/26/2022       Reactions   Citalopram Other (See Comments)   Feels odd   Doxycycline Hyclate Nausea And Vomiting   Erythromycin Other (See Comments)   Irritated stomach   Montelukast Sodium Itching   Nitrofurantoin Nausea And Vomiting   Sulfa Antibiotics Nausea And Vomiting   Tramadol Hcl Other (See Comments)   Feels odd   Venlafaxine Hcl Other (See Comments)   Feels odd   Amoxicillin-pot Clavulanate Nausea And Vomiting   Clarithromycin Rash    See  Physician Discharge Summary  Patient: Tami Jones MVH:[id number] DOB: 1955/07/25   Code Status: Prior Admit date: 11/24/2022 Discharge date: 11/26/2022 Disposition: Home, No home health services recommended PCP: Karie Schwalbe, MD  Recommendations for Outpatient Follow-up:  Follow up with PCP within 1-2 weeks Regarding general hospital follow up and preventative care Recommend blood pressure monitoring and medication modifications as needed follow up with neurologist- Theora Master and possible endovascular neurosurgeon referral for progressive cerebral artery stenosis.   Discharge Diagnoses:  Principal Problem:   TIA (transient ischemic attack) Active Problems:   Cerebrovascular disease   Essential hypertension, benign   UTI (urinary tract infection)   Dyslipidemia   Type 2 diabetes mellitus with other circulatory complications (HCC)   GERD   Hypertensive emergency   Paresthesia  Brief Hospital Course Summary: Tami Jones is a 67 y.o. female with medical history significant of HTN, CVA with residual R-sided weakness, asthma, gerd, HLD.   They presented with weakness. Noted right-sided weakness that seem to have worsened from baseline.  Also with left upper extremity paresthesias.  No slurred speech or confusion.  Patient states she took her blood pressure and it was very high at home.  Has been compliant with home medication regimen including Norvasc and metoprolol.   EMS noted BP 270s over 130s.  Repeat pressure with reading in the 200s over 100s. In the ER: afebrile, blood pressures 160s to 230s over 70s to 100s.  Satting well on room air.  White count 6.6, hemoglobin 13.9, platelets 226, creatinine 1.12, glucose 213.  Urinalysis indicative of infection.   CT head within normal limits.  MRI brain: 1. No evidence of acute intracranial abnormality. 2. Multiple remote infarcts. EKG sinus tach with nonspecific ST and T wave changes.   They were initially treated with  antihypertensives, aspirin, statin, fosfomycin, CTX.    Patient was admitted to medicine service for further workup and management of weakness and UTI as outlined in detail below.   Concern for CVA was ruled out with MRI brain: 1. No evidence of acute intracranial abnormality. 2. Multiple remote infarcts. Her symptoms resolved. Head and neck CT showed significant progression of vascular stenosis which vascular surgery reviewed and did not recommend intervention on their part due to the location within cerebrum. Recommended she follow up with neurologist- Theora Master and possible endovascular neurosurgeon referral.   Severe range blood pressure were moderately well controlled on her home regimen but due to borderline bradycardia and SBP 140-170s, she was additionally prescribed valsartan.   Discharge Condition: Stable, improved Recommended discharge diet: Cardiac diet  Consultations: Vascular surgery   Procedures/Studies: Brain MRI  Discharge Instructions     Ambulatory referral to Neurology   Complete by: As directed    An appointment is requested in approximately: 2 weeks Regarding TIA, stroke prevention, significant intracranial arterial stenosis. Seen by vascular surgery who recommended evaluation by neurosurgery vascular specialist for intervention      Allergies as of 11/26/2022       Reactions   Citalopram Other (See Comments)   Feels odd   Doxycycline Hyclate Nausea And Vomiting   Erythromycin Other (See Comments)   Irritated stomach   Montelukast Sodium Itching   Nitrofurantoin Nausea And Vomiting   Sulfa Antibiotics Nausea And Vomiting   Tramadol Hcl Other (See Comments)   Feels odd   Venlafaxine Hcl Other (See Comments)   Feels odd   Amoxicillin-pot Clavulanate Nausea And Vomiting   Clarithromycin Rash    See  Date of Exam: 11/25/2022 Medical Rec #:  [id number]      Height:       64.0 in Accession #:    [id number]     Weight:       242.7 lb Date of Birth:  06/28/1955      BSA:          2.124 m Patient Age:    67 years       BP:           133/72 mmHg Patient Gender: F              HR:           69 bpm. Exam Location:  [facility] Procedure: 2D Echo, Cardiac Doppler and Color Doppler Indications:     TIA G45.9  History:         Patient has prior history of Echocardiogram examinations, most                  recent 03/25/2022. Risk Factors:Hypertension and Diabetes. CVA.  Sonographer:     Cristela Blue Referring Phys:  [id number] Francoise Schaumann [place] Diagnosing Phys: Julien Nordmann MD IMPRESSIONS  1. Left ventricular ejection fraction, by estimation, is 55 to 60%. The left ventricle has normal function. The left ventricle has no  regional wall motion abnormalities. Left ventricular diastolic parameters are consistent with Grade I diastolic dysfunction (impaired relaxation).  2. Right ventricular systolic function is normal. The right ventricular size is normal. There is normal pulmonary artery systolic pressure. The estimated right ventricular systolic pressure is 24.0 mmHg.  3. The mitral valve is normal in structure. No evidence of mitral valve regurgitation. No evidence of mitral stenosis.  4. The aortic valve has an indeterminant number of cusps. Aortic valve regurgitation is not visualized. No aortic stenosis is present.  5. The inferior vena cava is normal in size with greater than 50% respiratory variability, suggesting right atrial pressure of 3 mmHg. FINDINGS  Left Ventricle: Left ventricular ejection fraction, by estimation, is 55 to 60%. The left ventricle has normal function. The left ventricle has no regional wall motion abnormalities. The left ventricular internal cavity size was normal in size. There is  no left ventricular hypertrophy. Left ventricular diastolic parameters are consistent with Grade I diastolic dysfunction (impaired relaxation). Right Ventricle: The right ventricular size is normal. No increase in right ventricular wall thickness. Right ventricular systolic function is normal. There is normal pulmonary artery systolic pressure. The tricuspid regurgitant velocity is 2.18 m/s, and  with an assumed right atrial pressure of 5 mmHg, the estimated right ventricular systolic pressure is 24.0 mmHg. Left Atrium: Left atrial size was normal in size. Right Atrium: Right atrial size was normal in size. Pericardium: There is no evidence of pericardial effusion. Mitral Valve: The mitral valve is normal in structure. No evidence of mitral valve regurgitation. No evidence of mitral valve stenosis. MV peak gradient, 3.5 mmHg. The mean mitral valve gradient is 1.0 mmHg. Tricuspid Valve: The tricuspid valve is normal in  structure. Tricuspid valve regurgitation is not demonstrated. No evidence of tricuspid stenosis. Aortic Valve: The aortic valve has an indeterminant number of cusps. Aortic valve regurgitation is not visualized. No aortic stenosis is present. Aortic valve mean gradient measures 4.0 mmHg. Aortic valve peak gradient measures 6.2 mmHg. Aortic valve area, by VTI measures 2.92 cm. Pulmonic Valve: The pulmonic valve was normal in structure. Pulmonic valve regurgitation is not visualized. No evidence of pulmonic stenosis. Aorta: The aortic root  Physician Discharge Summary  Patient: Tami Jones MVH:[id number] DOB: 1955/07/25   Code Status: Prior Admit date: 11/24/2022 Discharge date: 11/26/2022 Disposition: Home, No home health services recommended PCP: Karie Schwalbe, MD  Recommendations for Outpatient Follow-up:  Follow up with PCP within 1-2 weeks Regarding general hospital follow up and preventative care Recommend blood pressure monitoring and medication modifications as needed follow up with neurologist- Theora Master and possible endovascular neurosurgeon referral for progressive cerebral artery stenosis.   Discharge Diagnoses:  Principal Problem:   TIA (transient ischemic attack) Active Problems:   Cerebrovascular disease   Essential hypertension, benign   UTI (urinary tract infection)   Dyslipidemia   Type 2 diabetes mellitus with other circulatory complications (HCC)   GERD   Hypertensive emergency   Paresthesia  Brief Hospital Course Summary: Tami Jones is a 67 y.o. female with medical history significant of HTN, CVA with residual R-sided weakness, asthma, gerd, HLD.   They presented with weakness. Noted right-sided weakness that seem to have worsened from baseline.  Also with left upper extremity paresthesias.  No slurred speech or confusion.  Patient states she took her blood pressure and it was very high at home.  Has been compliant with home medication regimen including Norvasc and metoprolol.   EMS noted BP 270s over 130s.  Repeat pressure with reading in the 200s over 100s. In the ER: afebrile, blood pressures 160s to 230s over 70s to 100s.  Satting well on room air.  White count 6.6, hemoglobin 13.9, platelets 226, creatinine 1.12, glucose 213.  Urinalysis indicative of infection.   CT head within normal limits.  MRI brain: 1. No evidence of acute intracranial abnormality. 2. Multiple remote infarcts. EKG sinus tach with nonspecific ST and T wave changes.   They were initially treated with  antihypertensives, aspirin, statin, fosfomycin, CTX.    Patient was admitted to medicine service for further workup and management of weakness and UTI as outlined in detail below.   Concern for CVA was ruled out with MRI brain: 1. No evidence of acute intracranial abnormality. 2. Multiple remote infarcts. Her symptoms resolved. Head and neck CT showed significant progression of vascular stenosis which vascular surgery reviewed and did not recommend intervention on their part due to the location within cerebrum. Recommended she follow up with neurologist- Theora Master and possible endovascular neurosurgeon referral.   Severe range blood pressure were moderately well controlled on her home regimen but due to borderline bradycardia and SBP 140-170s, she was additionally prescribed valsartan.   Discharge Condition: Stable, improved Recommended discharge diet: Cardiac diet  Consultations: Vascular surgery   Procedures/Studies: Brain MRI  Discharge Instructions     Ambulatory referral to Neurology   Complete by: As directed    An appointment is requested in approximately: 2 weeks Regarding TIA, stroke prevention, significant intracranial arterial stenosis. Seen by vascular surgery who recommended evaluation by neurosurgery vascular specialist for intervention      Allergies as of 11/26/2022       Reactions   Citalopram Other (See Comments)   Feels odd   Doxycycline Hyclate Nausea And Vomiting   Erythromycin Other (See Comments)   Irritated stomach   Montelukast Sodium Itching   Nitrofurantoin Nausea And Vomiting   Sulfa Antibiotics Nausea And Vomiting   Tramadol Hcl Other (See Comments)   Feels odd   Venlafaxine Hcl Other (See Comments)   Feels odd   Amoxicillin-pot Clavulanate Nausea And Vomiting   Clarithromycin Rash    See

## 2022-11-26 NOTE — Plan of Care (Signed)
  Problem: Education: Goal: Knowledge of disease or condition will improve Outcome: Progressing Goal: Knowledge of secondary prevention will improve (MUST DOCUMENT ALL) Outcome: Progressing Goal: Knowledge of patient specific risk factors will improve Loraine Leriche N/A or DELETE if not current risk factor) Outcome: Progressing   Problem: Ischemic Stroke/TIA Tissue Perfusion: Goal: Complications of ischemic stroke/TIA will be minimized Outcome: Progressing   Problem: Coping: Goal: Will verbalize positive feelings about self Outcome: Progressing Goal: Will identify appropriate support needs Outcome: Progressing   Problem: Health Behavior/Discharge Planning: Goal: Ability to manage health-related needs will improve Outcome: Progressing Goal: Goals will be collaboratively established with patient/family Outcome: Progressing   Problem: Self-Care: Goal: Ability to participate in self-care as condition permits will improve Outcome: Progressing Goal: Verbalization of feelings and concerns over difficulty with self-care will improve Outcome: Progressing Goal: Ability to communicate needs accurately will improve Outcome: Progressing   Problem: Nutrition: Goal: Risk of aspiration will decrease Outcome: Progressing Goal: Dietary intake will improve Outcome: Progressing   Problem: Education: Goal: Knowledge of General Education information will improve Description: Including pain rating scale, medication(s)/side effects and non-pharmacologic comfort measures Outcome: Progressing   Problem: Health Behavior/Discharge Planning: Goal: Ability to manage health-related needs will improve Outcome: Progressing   Problem: Clinical Measurements: Goal: Ability to maintain clinical measurements within normal limits will improve Outcome: Progressing Goal: Will remain free from infection Outcome: Progressing Goal: Diagnostic test results will improve Outcome: Progressing Goal: Respiratory  complications will improve Outcome: Progressing Goal: Cardiovascular complication will be avoided Outcome: Progressing   Problem: Activity: Goal: Risk for activity intolerance will decrease Outcome: Progressing   Problem: Nutrition: Goal: Adequate nutrition will be maintained Outcome: Progressing   Problem: Coping: Goal: Level of anxiety will decrease Outcome: Progressing   Problem: Elimination: Goal: Will not experience complications related to bowel motility Outcome: Progressing Goal: Will not experience complications related to urinary retention Outcome: Progressing   Problem: Pain Managment: Goal: General experience of comfort will improve Outcome: Progressing   Problem: Safety: Goal: Ability to remain free from injury will improve Outcome: Progressing   Problem: Skin Integrity: Goal: Risk for impaired skin integrity will decrease Outcome: Progressing   Problem: Education: Goal: Ability to describe self-care measures that may prevent or decrease complications (Diabetes Survival Skills Education) will improve Outcome: Progressing Goal: Individualized Educational Video(s) Outcome: Progressing   Problem: Coping: Goal: Ability to adjust to condition or change in health will improve Outcome: Progressing   Problem: Fluid Volume: Goal: Ability to maintain a balanced intake and output will improve Outcome: Progressing   Problem: Health Behavior/Discharge Planning: Goal: Ability to identify and utilize available resources and services will improve Outcome: Progressing Goal: Ability to manage health-related needs will improve Outcome: Progressing   Problem: Metabolic: Goal: Ability to maintain appropriate glucose levels will improve Outcome: Progressing   Problem: Nutritional: Goal: Maintenance of adequate nutrition will improve Outcome: Progressing Goal: Progress toward achieving an optimal weight will improve Outcome: Progressing   Problem: Skin  Integrity: Goal: Risk for impaired skin integrity will decrease Outcome: Progressing   Problem: Tissue Perfusion: Goal: Adequacy of tissue perfusion will improve Outcome: Progressing

## 2022-11-26 NOTE — Care Management Important Message (Signed)
Important Message  Patient Details  Name: Tami Jones MRN: [id number] Date of Birth: September 06, 1955   Important Message Given:  N/A - LOS <3 / Initial given by admissions     Olegario Messier A [name] 11/26/2022, 8:17 AM

## 2022-11-29 ENCOUNTER — Telehealth: Payer: Self-pay | Admitting: *Deleted

## 2022-11-29 NOTE — Transitions of Care (Post Inpatient/ED Visit) (Signed)
11/29/2022  Name: Tami Jones MRN: [id number] DOB: [birth year]  Today's TOC FU Call Status: Today's TOC FU Call Status:: Unsuccessful Call (1st Attempt) Unsuccessful Call (1st Attempt) Date: 11/29/22  Attempted to reach the patient regarding the most recent Inpatient/ED visit.  Follow Up Plan: Additional outreach attempts will be made to reach the patient to complete the Transitions of Care (Post Inpatient/ED visit) call.   Gean Maidens BSN RN [facility] Care Management [phone]

## 2022-11-29 NOTE — Consult Note (Addendum)
[facility] Inpatient Consult   11/29/2022  Tami Jones [birth year] [id number]  Primary Care Provider:  Tillman Abide [facility])  Patient is currently active with Care Management for chronic disease management services.  Patient has been engaged by a Investment banker, operational.  Our community based plan of care has focused on disease management and community resource support.   Patient will receive a post hospital call and will be evaluated for assessments and disease process education.   Plan: Will alert the nurse care manager of pt's discharge disposition.  Of note, Care Management services does not replace or interfere with any services that are needed or arranged by inpatient [facility] care management team.   For additional questions or referrals please contact:    Elliot Cousin, RN, [facility] Liaison [place]   Population Health Office Hours MTWF  8:00 am-6:00 pm [phone] mobile [phone] [Office toll free line] Office Hours are M-F 8:30 - 5 pm [email or web address]

## 2022-12-09 ENCOUNTER — Telehealth: Payer: Self-pay | Admitting: Internal Medicine

## 2022-12-09 ENCOUNTER — Encounter: Payer: Self-pay | Admitting: *Deleted

## 2022-12-09 ENCOUNTER — Encounter: Payer: Self-pay | Admitting: Internal Medicine

## 2022-12-09 ENCOUNTER — Ambulatory Visit: Payer: Medicare HMO | Admitting: Internal Medicine

## 2022-12-09 VITALS — BP 138/80 | HR 81 | Temp 98.0°F | Ht 63.0 in | Wt 232.0 lb

## 2022-12-09 DIAGNOSIS — I672 Cerebral atherosclerosis: Secondary | ICD-10-CM

## 2022-12-09 DIAGNOSIS — Z8673 Personal history of transient ischemic attack (TIA), and cerebral infarction without residual deficits: Secondary | ICD-10-CM | POA: Diagnosis not present

## 2022-12-09 DIAGNOSIS — E7800 Pure hypercholesterolemia, unspecified: Secondary | ICD-10-CM

## 2022-12-09 DIAGNOSIS — K047 Periapical abscess without sinus: Secondary | ICD-10-CM

## 2022-12-09 DIAGNOSIS — E1159 Type 2 diabetes mellitus with other circulatory complications: Secondary | ICD-10-CM

## 2022-12-09 DIAGNOSIS — Z23 Encounter for immunization: Secondary | ICD-10-CM | POA: Diagnosis not present

## 2022-12-09 DIAGNOSIS — Z7984 Long term (current) use of oral hypoglycemic drugs: Secondary | ICD-10-CM | POA: Diagnosis not present

## 2022-12-09 MED ORDER — GLIPIZIDE ER 5 MG PO TB24: 10.0000 mg | ORAL_TABLET | Freq: Every day | ORAL | 3 refills | Status: DC

## 2022-12-09 MED ORDER — AMOXICILLIN 500 MG PO TABS: 1000.0000 mg | ORAL_TABLET | Freq: Two times a day (BID) | ORAL | 1 refills | Status: DC

## 2022-12-09 MED ORDER — KETOCONAZOLE 2 % EX CREA: 1.0000 | TOPICAL_CREAM | Freq: Two times a day (BID) | CUTANEOUS | 1 refills | Status: AC | PRN

## 2022-12-09 NOTE — Addendum Note (Signed)
Addended by: Tillman Abide I on: 12/09/2022 10:00 AM   Modules accepted: Orders

## 2022-12-09 NOTE — Telephone Encounter (Signed)
It was not on the med list. Will follow-up with Dr Alphonsus Sias.

## 2022-12-09 NOTE — Telephone Encounter (Signed)
Tried to call pt. VM is full so I could not leave a message.

## 2022-12-09 NOTE — Telephone Encounter (Signed)
Patient called in stating that the hospital took her off of protonix when she was in there,and she is wondering if she still needs to be taking it?

## 2022-12-09 NOTE — Assessment & Plan Note (Signed)
Will set up with lipid clinic--may be candidate for more aggressive Rx

## 2022-12-09 NOTE — Patient Instructions (Signed)
Please start the valsartan--you can take it in the morning Take both glipizide tablets with breakfast I have made referrals for the cholesterol clinic and the neurologist, Dr Malvin Johns

## 2022-12-09 NOTE — Assessment & Plan Note (Signed)
Past infarcts and severe intracranial stenosis --not amenable to vascular intervention (not sure IR is a possibility) Is on atorvastatin 80 but LDL still elevated On plavix Will set up follow up with neurology Malvin Johns)

## 2022-12-09 NOTE — Progress Notes (Signed)
Subjective:    Patient ID: Tami Jones, female    DOB: May 16, 1955, 67 y.o.   MRN: [id number]  HPI Here for hospital follow up Reviewed imaging and notes No new CVA but progression of intracranial stenosis---widespread  BP shot up yesterday--had to have EMS out Was 252/104--by EMS Did EKG that was okay Declined ER evaluation She wonders if she has an infection---due for dental extractions Used cool compresses and it settled down  Was due for cataracts--but postponed due to TIAs  Some left ear and jaw pain Some low grade fever Some slight pain with chewing  Thinks there was a misunderstanding about her metoprolol dose---[facility] Has continued on the 25mg  that was on her discharge papers Rx for valsartan--she didn't start this  Ongoing weakness in right side--no worse Some general fatigue  Has been working on her diet Glipizide bid---occasional sugars over 200 Eating some meals that [employer] provided  Current Outpatient Medications on File Prior to Visit  Medication Sig Dispense Refill   acetaminophen (TYLENOL) 325 MG tablet Take 650 mg by mouth every 6 (six) hours as needed.     ALPRAZolam (XANAX) 0.25 MG tablet Take 1 tablet (0.25 mg total) by mouth 3 (three) times daily as needed for anxiety or sleep. 30 tablet 0   amLODipine (NORVASC) 10 MG tablet Take 1 tablet (10 mg total) by mouth every evening. 90 tablet 3   aspirin EC 81 MG tablet Take 81 mg by mouth every evening.      atorvastatin (LIPITOR) 80 MG tablet TAKE 1 TABLET BY MOUTH EVERY EVENING 90 tablet 3   clopidogrel (PLAVIX) 75 MG tablet TAKE 1 TABLET BY MOUTH EVERY DAY 90 tablet 3   gatifloxacin (ZYMAXID) 0.5 % SOLN Place 1 drop into the right eye 4 (four) times daily.     glipiZIDE (GLUCOTROL XL) 5 MG 24 hr tablet Take 1 tablet (5 mg total) by mouth in the morning and at bedtime. 180 tablet 3   glucose blood test strip Use daily as instructed 100 each 3   ketorolac (ACULAR) 0.5 % ophthalmic solution  Place 1 drop into the right eye 4 (four) times daily.     lidocaine (XYLOCAINE) 5 % ointment Apply 1 Application topically as needed. 35.44 g 0   metoprolol succinate (TOPROL-XL) 25 MG 24 hr tablet Take 1 tablet (25 mg total) by mouth daily.     prednisoLONE acetate (PRED FORTE) 1 % ophthalmic suspension Place 1 drop into the right eye 4 (four) times daily.     valsartan (DIOVAN) 40 MG tablet Take 1 tablet (40 mg total) by mouth daily. (Patient not taking: Reported on 12/09/2022) 60 tablet 0   No current facility-administered medications on file prior to visit.    Allergies  Allergen Reactions   Citalopram Other (See Comments)    Feels odd   Doxycycline Hyclate Nausea And Vomiting   Erythromycin Other (See Comments)    Irritated stomach   Montelukast Sodium Itching   Nitrofurantoin Nausea And Vomiting   Sulfa Antibiotics Nausea And Vomiting   Tramadol Hcl Other (See Comments)    Feels odd   Venlafaxine Hcl Other (See Comments)    Feels odd   Amoxicillin-Pot Clavulanate Nausea And Vomiting   Clarithromycin Rash     See ER note 02/2017    Past Medical History:  Diagnosis Date   Anxiety    Asthma    CVA (cerebral vascular accident) (HCC) 03/2006   right occipital, Dr. 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 EF 60%   CESAREAN SECTION     ESOPHAGOGASTRODUODENOSCOPY  05/2005   TONSILLECTOMY AND ADENOIDECTOMY      Family History  Problem Relation Age of Onset   Coronary artery disease Mother    Diabetes Mellitus II Mother    Heart attack Mother 42   Diabetes Mellitus II Maternal Grandmother     Social History   Socioeconomic History   Marital status: Divorced    Spouse name: Not on file   Number of children: 2   Years of education:  Not on file   Highest education level: Not on file  Occupational History   Occupation: LAB TECH    Employer: [employer]  Tobacco Use   Smoking status: Never    Passive exposure: Never   Smokeless tobacco: Never  Vaping Use   Vaping status: Never Used  Substance and Sexual Activity   Alcohol use: No    Alcohol/week: 0.0 standard drinks of alcohol    Comment: wine occassionally   Drug use: No   Sexual activity: Yes    Birth control/protection: Condom  Other Topics Concern   Not on file  Social History Narrative   No living will   Daughter should be health care decision maker   Would accept resuscitation   No tube feeds if cognitively aware   Social Determinants of Health   Financial Resource Strain: High Risk (05/05/2022)   Overall Financial Resource Strain (CARDIA)    Difficulty of Paying Living Expenses: Hard  Food Insecurity: No Food Insecurity (11/24/2022)   Hunger Vital Sign    Worried About Running Out of Food in the Last Year: Never true    Ran Out of Food in the Last Year: Never true  Transportation Needs: No Transportation Needs (11/24/2022)   PRAPARE - Administrator, Civil Service (Medical): No    Lack of Transportation (Non-Medical): No  Physical Activity: Insufficiently Active (05/05/2022)   Exercise Vital Sign    Days of Exercise per Week: 4 days    Minutes of Exercise per Session: 20 min  Stress: Stress Concern Present (05/05/2022)   [employer] of Occupational Health - Occupational Stress Questionnaire    Feeling of Stress : To some extent  Social Connections: Moderately Isolated (05/05/2022)   Social Connection and Isolation Panel [NHANES]    Frequency of Communication with Friends and Family: More than three times a week    Frequency of Social Gatherings with Friends and Family: More than three times a week    Attends Religious Services: 1 to 4 times per year    Active Member of [employer] or Organizations: No    Attends Banker  Meetings: Never    Marital Status: Divorced  Catering manager Violence: Not At Risk (11/24/2022)   Humiliation, Afraid, Rape, and Kick questionnaire    Fear of Current or Ex-Partner: No    Emotionally Abused: No    Physically Abused: No    Sexually Abused: No   Review of Systems Not eating great Has pain in left buttock as well---and into groin. Also down left leg Trying to walk some    Objective:   Physical Exam HENT:     Right Ear: Tympanic membrane  and ear canal normal.     Left Ear: Tympanic membrane and ear canal normal.     Mouth/Throat:     Comments: Broken left lower molar with some inflammation Cardiovascular:     Rate and Rhythm: Normal rate and regular rhythm.     Heart sounds: No murmur heard.    No gallop.  Pulmonary:     Effort: Pulmonary effort is normal.     Breath sounds: Normal breath sounds. No wheezing or rales.  Musculoskeletal:     Cervical back: Neck supple.     Right lower leg: No edema.     Left lower leg: No edema.  Lymphadenopathy:     Cervical: No cervical adenopathy.  Neurological:     Comments: Mild right side weakness that appears to be stable            Assessment & Plan:

## 2022-12-09 NOTE — Assessment & Plan Note (Signed)
Lab Results  Component Value Date   HGBA1C 7.4 (H) 11/24/2022   Acceptable control

## 2022-12-09 NOTE — Assessment & Plan Note (Signed)
Will give amoxil 1000 bid x 7 days --but may need longer  Needs to set up with dentist for extractions---does have insurance

## 2022-12-10 NOTE — Telephone Encounter (Signed)
Spoke to pt. She will take it as needed. We talked about the fact her BP was still elevated. Said her face feels hot, but that could be from her teeth. She only started the valsartan today. If she doesn't feel better, she may go to the ER, again. I told her I would call Monday if she does not go to the ER.

## 2022-12-10 NOTE — Telephone Encounter (Signed)
Spoke to pt

## 2022-12-12 ENCOUNTER — Emergency Department
Admission: EM | Admit: 2022-12-12 | Discharge: 2022-12-12 | Disposition: A | Discharge status: Home / Self Care | Payer: Medicare HMO | Attending: Emergency Medicine | Admitting: Emergency Medicine

## 2022-12-12 ENCOUNTER — Emergency Department: Payer: Medicare HMO

## 2022-12-12 DIAGNOSIS — R0789 Other chest pain: Secondary | ICD-10-CM

## 2022-12-12 DIAGNOSIS — R000 Tachycardia, unspecified: Secondary | ICD-10-CM | POA: Diagnosis not present

## 2022-12-12 DIAGNOSIS — F419 Anxiety disorder, unspecified: Secondary | ICD-10-CM | POA: Diagnosis not present

## 2022-12-12 DIAGNOSIS — R531 Weakness: Secondary | ICD-10-CM | POA: Diagnosis not present

## 2022-12-12 DIAGNOSIS — R0989 Other specified symptoms and signs involving the circulatory and respiratory systems: Secondary | ICD-10-CM | POA: Diagnosis not present

## 2022-12-12 DIAGNOSIS — Z043 Encounter for examination and observation following other accident: Secondary | ICD-10-CM | POA: Diagnosis not present

## 2022-12-12 DIAGNOSIS — I10 Essential (primary) hypertension: Secondary | ICD-10-CM | POA: Insufficient documentation

## 2022-12-12 DIAGNOSIS — Z743 Need for continuous supervision: Secondary | ICD-10-CM | POA: Diagnosis not present

## 2022-12-12 DIAGNOSIS — R079 Chest pain, unspecified: Secondary | ICD-10-CM | POA: Diagnosis not present

## 2022-12-12 LAB — TROPONIN I (HIGH SENSITIVITY)
Troponin I (High Sensitivity): 11 ng/L (ref <18)
Troponin I (High Sensitivity): 13 ng/L (ref <18)

## 2022-12-12 LAB — URINALYSIS, ROUTINE W REFLEX MICROSCOPIC
Bilirubin Urine: NEGATIVE
Glucose, UA: NEGATIVE mg/dL
Hgb urine dipstick: NEGATIVE
Ketones, ur: NEGATIVE mg/dL
Leukocytes,Ua: NEGATIVE
Nitrite: NEGATIVE
Protein, ur: NEGATIVE mg/dL
Specific Gravity, Urine: 1.006 (ref 1.005–1.030)
pH: 7 (ref 5.0–8.0)

## 2022-12-12 LAB — CBC WITH DIFFERENTIAL/PLATELET
Abs Immature Granulocytes: 0.01 10*3/uL (ref 0.00–0.07)
Basophils Absolute: 0 10*3/uL (ref 0.0–0.1)
Basophils Relative: 1 %
Eosinophils Absolute: 0.1 10*3/uL (ref 0.0–0.5)
Eosinophils Relative: 1 %
HCT: 37.8 % (ref 36.0–46.0)
Hemoglobin: 13 g/dL (ref 12.0–15.0)
Immature Granulocytes: 0 %
Lymphocytes Relative: 33 %
Lymphs Abs: 2 10*3/uL (ref 0.7–4.0)
MCH: 31.1 pg (ref 26.0–34.0)
MCHC: 34.4 g/dL (ref 30.0–36.0)
MCV: 90.4 fL (ref 80.0–100.0)
Monocytes Absolute: 0.5 10*3/uL (ref 0.1–1.0)
Monocytes Relative: 8 %
Neutro Abs: 3.5 10*3/uL (ref 1.7–7.7)
Neutrophils Relative %: 57 %
Platelets: 212 10*3/uL (ref 150–400)
RBC: 4.18 MIL/uL (ref 3.87–5.11)
RDW: 12.4 % (ref 11.5–15.5)
WBC: 6.1 10*3/uL (ref 4.0–10.5)
nRBC: 0 % (ref 0.0–0.2)

## 2022-12-12 LAB — BASIC METABOLIC PANEL
Anion gap: 7 (ref 5–15)
BUN: 21 mg/dL (ref 8–23)
CO2: 24 mmol/L (ref 22–32)
Calcium: 8.7 mg/dL — ABNORMAL LOW (ref 8.9–10.3)
Chloride: 105 mmol/L (ref 98–111)
Creatinine, Ser: 1.09 mg/dL — ABNORMAL HIGH (ref 0.44–1.00)
GFR, Estimated: 56 mL/min — ABNORMAL LOW (ref >60)
Glucose, Bld: 200 mg/dL — ABNORMAL HIGH (ref 70–99)
Potassium: 3.7 mmol/L (ref 3.5–5.1)
Sodium: 136 mmol/L (ref 135–145)

## 2022-12-12 MED ORDER — ALPRAZOLAM 0.5 MG PO TABS
0.2500 mg | ORAL_TABLET | Freq: Once | ORAL | Status: AC
  Administered 2022-12-12: 0.25 mg via ORAL
  Filled 2022-12-12: qty 1

## 2022-12-12 MED ORDER — OXYCODONE HCL 5 MG PO TABS
5.0000 mg | ORAL_TABLET | Freq: Once | ORAL | Status: DC
  Filled 2022-12-12: qty 1

## 2022-12-12 MED ORDER — ACETAMINOPHEN 500 MG PO TABS
1000.0000 mg | ORAL_TABLET | Freq: Once | ORAL | Status: AC
  Administered 2022-12-12: 1000 mg via ORAL
  Filled 2022-12-12: qty 2

## 2022-12-12 NOTE — ED Triage Notes (Signed)
Patient BIB [facility] from home. Patient reports walking to bathroom and started to fall against the wall. Patient denies hitting the  ground. Patient's daughter did VS. Patient's B/P was 209/93.  Once EMS arrived patient c/o CP. Patient was given 324 ASA and 2 nitroglycerin sprays.CP resolved.  Patient also reports that  at about 0000 her daughter gave her a Xanax. Patient was here 2 weeks ago for HTN and meds were adjusted. Patient started antibiotics on Thursday for dental work that needs to be done.   Prior HX of stroke

## 2022-12-12 NOTE — ED Provider Notes (Signed)
[facility] Provider Note    Event Date/Time   First MD Initiated Contact with Patient 12/12/22 0136     (approximate)   History   Hypertension   HPI  Tami Jones is a 67 y.o. female who presents to the ED for evaluation of Hypertension   I reviewed medical DC summary from 2 weeks ago.  She has history of HTN, anxiety, GERD.  CVA with residual right-sided weakness.  Was admitted for possible TIA with worsened right-sided weakness from baseline.  MRI brain without acute features.  She is on DAPT without anticoagulation.  Patient presents to the ED due to concern of waxing and waning blood pressure.  Reports checking her blood pressure at least 4 times per day and frustrated that it is sometimes high and sometimes normal.  Reports tonight that she had a brief episode of chest pain that has since resolved.  No chest pain now.  Physical Exam   Triage Vital Signs: ED Triage Vitals  Encounter Vitals Group     BP 12/12/22 0130 (!) 166/79     Systolic BP Percentile --      Diastolic BP Percentile --      Pulse Rate 12/12/22 0130 90     Resp 12/12/22 0130 17     Temp 12/12/22 0134 97.8 F (36.6 C)     Temp Source 12/12/22 0134 Oral     SpO2 12/12/22 0130 100 %     Weight 12/12/22 0135 232 lb (105.2 kg)     Height 12/12/22 0135 5\' 3"  (1.6 m)     Head Circumference --      Peak Flow --      Pain Score 12/12/22 0135 0     Pain Loc --      Pain Education --      Exclude from Growth Chart --     Most recent vital signs: Vitals:   12/12/22 0415 12/12/22 0430  BP: 136/79 (!) 140/69  Pulse: 77 76  Resp:    Temp:    SpO2: 95% 96%    General: Awake, no distress.  Anxious CV:  Good peripheral perfusion.  Resp:  Normal effort.  Clear Abd:  No distention.  Soft MSK:  No deformity noted.  Reproducible chest discomfort without overlying skin changes or signs of trauma Neuro:  No focal deficits appreciated. Other:     ED Results / Procedures /  Treatments   Labs (all labs ordered are listed, but only abnormal results are displayed) Labs Reviewed  BASIC METABOLIC PANEL - Abnormal; Notable for the following components:      Result Value   Glucose, Bld 200 (*)    Creatinine, Ser 1.09 (*)    Calcium 8.7 (*)    GFR, Estimated 56 (*)    All other components within normal limits  URINALYSIS, ROUTINE W REFLEX MICROSCOPIC - Abnormal; Notable for the following components:   Color, Urine COLORLESS (*)    APPearance CLEAR (*)    All other components within normal limits  CBC WITH DIFFERENTIAL/PLATELET  TROPONIN I (HIGH SENSITIVITY)  TROPONIN I (HIGH SENSITIVITY)    EKG Sinus rhythm with a rate of 96 bpm.  Normal axis and intervals.  Nonspecific ST changes inferiorly and laterally without STEMI. Fairly similar morphology as comparison from 10/9  RADIOLOGY 2 view CXR interpreted by me with low lung volumes without clear evidence of acute pathology.  Official radiology report(s): DG Chest 2 View  Result Date: 12/12/2022  CLINICAL DATA:  Larey Seat against the wall in the bathroom. Elevated blood pressure. Chest pain resolved with nitroglycerin. EXAM: CHEST - 2 VIEW COMPARISON:  11/08/2022 FINDINGS: Low lung volumes accentuate cardiomediastinal silhouette and pulmonary vascularity. No focal consolidation, pleural effusion, or pneumothorax. No definite displaced rib fractures. IMPRESSION: Low lung volumes.  No active cardiopulmonary disease. Electronically Signed   By: Minerva Fester M.D.   On: 12/12/2022 03:23    PROCEDURES and INTERVENTIONS:  .1-3 Lead EKG Interpretation  Performed by: Delton Prairie, MD Authorized by: Delton Prairie, MD     Interpretation: normal     ECG rate:  74   ECG rate assessment: normal     Rhythm: sinus rhythm     Ectopy: none     Conduction: normal     Medications  oxyCODONE (Oxy IR/ROXICODONE) immediate release tablet 5 mg (5 mg Oral Not Given 12/12/22 0352)  acetaminophen (TYLENOL) tablet 1,000 mg  (1,000 mg Oral Given 12/12/22 0350)  ALPRAZolam (XANAX) tablet 0.25 mg (0.25 mg Oral Given 12/12/22 0350)     IMPRESSION / MDM / ASSESSMENT AND PLAN / ED COURSE  I reviewed the triage vital signs and the nursing notes.  Differential diagnosis includes, but is not limited to, ACS, PTX, PNA, muscle strain/spasm, PE, dissection, anxiety, pleural effusion  {Patient presents with symptoms of an acute illness or injury that is potentially life-threatening.  Patient presents to the ED for evaluation of hypertension, without evidence of endorgan damage or any severe features, suitable for outpatient management.  No signs of cardiac ischemia, 2 negative troponins.  Otherwise has a clear CXR, clear urine and normal CBC.  Metabolic panel with mild hyperglycemia without acidosis.  No signs of endorgan damage.  Suspect a degree of anxiety.  While I considered observation admission I do believe she is suitable for outpatient management      FINAL CLINICAL IMPRESSION(S) / ED DIAGNOSES   Final diagnoses:  Hypertension, unspecified type  Anxiety  Other chest pain     Rx / DC Orders   ED Discharge Orders     None        Note:  This document was prepared using Dragon voice recognition software and may include unintentional dictation errors.   Delton Prairie, MD 12/12/22 [phone]

## 2022-12-13 NOTE — Telephone Encounter (Signed)
She went to the ER yesterday.

## 2022-12-19 ENCOUNTER — Other Ambulatory Visit: Payer: Self-pay | Admitting: Internal Medicine

## 2022-12-24 ENCOUNTER — Other Ambulatory Visit: Payer: Self-pay | Admitting: Internal Medicine

## 2022-12-24 ENCOUNTER — Telehealth: Payer: Self-pay | Admitting: Internal Medicine

## 2022-12-24 MED ORDER — ALPRAZOLAM 0.25 MG PO TABS: 0.2500 mg | ORAL_TABLET | Freq: Three times a day (TID) | ORAL | 0 refills | Status: DC | PRN

## 2022-12-24 NOTE — Telephone Encounter (Signed)
Prescription Request  12/24/2022  LOV: 12/09/2022  What is the name of the medication or equipment? ALPRAZolam (XANAX) 0.25 MG tablet   Have you contacted your pharmacy to request a refill? Yes   Which pharmacy would you like this sent to?  [facility] [address], [place] - [address] [place] [address] Phone: [phone] Fax: [phone]    Patient notified that their request is being sent to the clinical staff for review and that they should receive a response within 2 business days.   Please advise at [facility] [phone]

## 2022-12-24 NOTE — Telephone Encounter (Signed)
Tried to call pt. No answer and VM is full so I could not leave a message

## 2022-12-24 NOTE — Telephone Encounter (Signed)
Patient called in and stated that the hospital prescribed her valsartan (DIOVAN) 40 MG tablet for her blood pressure and amoxicillin (AMOXIL) 500 MG tablet from Dr. Alphonsus Sias. She stated that while taking these she doesn't feel well. She was wanting to know if something else could be sent in for her. Please advise. Thank you!

## 2022-12-31 NOTE — Telephone Encounter (Signed)
2nd Attempt to call pt. VM is full. This is a common issue when trying to reach her.

## 2023-01-05 NOTE — Telephone Encounter (Signed)
Pt is scheduled to see Dr Alphonsus Sias tomorrow.

## 2023-01-06 ENCOUNTER — Encounter: Payer: Self-pay | Admitting: Internal Medicine

## 2023-01-06 ENCOUNTER — Ambulatory Visit: Payer: Medicare HMO | Admitting: Internal Medicine

## 2023-01-06 VITALS — BP 138/84 | HR 82 | Temp 98.9°F | Ht 63.0 in | Wt 236.0 lb

## 2023-01-06 DIAGNOSIS — Z7984 Long term (current) use of oral hypoglycemic drugs: Secondary | ICD-10-CM

## 2023-01-06 DIAGNOSIS — E1159 Type 2 diabetes mellitus with other circulatory complications: Secondary | ICD-10-CM

## 2023-01-06 DIAGNOSIS — I10 Essential (primary) hypertension: Secondary | ICD-10-CM

## 2023-01-06 DIAGNOSIS — I672 Cerebral atherosclerosis: Secondary | ICD-10-CM | POA: Diagnosis not present

## 2023-01-06 DIAGNOSIS — Z8673 Personal history of transient ischemic attack (TIA), and cerebral infarction without residual deficits: Secondary | ICD-10-CM

## 2023-01-06 DIAGNOSIS — K047 Periapical abscess without sinus: Secondary | ICD-10-CM

## 2023-01-06 MED ORDER — LISINOPRIL 10 MG PO TABS: 10.0000 mg | ORAL_TABLET | Freq: Every day | ORAL | 3 refills | Status: DC

## 2023-01-06 NOTE — Progress Notes (Signed)
Subjective:    Patient ID: Tami Jones, female    DOB: 03/26/1955, 67 y.o.   MRN: [id number]  HPI Here for follow up of cerebrovascular disease and HTN  Feeling better now Had to stop the valsartan---made her not feel well BP 140/87, can be higher at night (like 152.92) Balance is a bit better--but still tripped over cord and fell last week  Resting better Using the xanax more Walking more  No chest pain--other than vague brief chest symptoms (like if BP is up) No SOB  Continuing on amoxil till dental extractions  Some right foot aching --mostly at night Is weak still  Monitoring sugars also--doing okay  Current Outpatient Medications on File Prior to Visit  Medication Sig Dispense Refill   acetaminophen (TYLENOL) 325 MG tablet Take 650 mg by mouth every 6 (six) hours as needed.     ALPRAZolam (XANAX) 0.25 MG tablet Take 1 tablet (0.25 mg total) by mouth 3 (three) times daily as needed for anxiety or sleep. 30 tablet 0   amLODipine (NORVASC) 10 MG tablet Take 1 tablet (10 mg total) by mouth every evening. 90 tablet 3   amoxicillin (AMOXIL) 500 MG tablet Take 2 tablets (1,000 mg total) by mouth 2 (two) times daily. 40 tablet 1   aspirin EC 81 MG tablet Take 81 mg by mouth every evening.      atorvastatin (LIPITOR) 80 MG tablet TAKE 1 TABLET BY MOUTH EVERY EVENING 90 tablet 3   clopidogrel (PLAVIX) 75 MG tablet TAKE 1 TABLET BY MOUTH EVERY DAY 90 tablet 3   gatifloxacin (ZYMAXID) 0.5 % SOLN Place 1 drop into the right eye 4 (four) times daily.     glipiZIDE (GLUCOTROL XL) 5 MG 24 hr tablet TAKE 1 TABLET BY MOUTH EVERY DAY WITH BREAKFAST 90 tablet 3   glucose blood test strip Use daily as instructed 100 each 3   ketoconazole (NIZORAL) 2 % cream Apply 1 Application topically 2 (two) times daily as needed for irritation. 60 g 1   ketorolac (ACULAR) 0.5 % ophthalmic solution Place 1 drop into the right eye 4 (four) times daily.     lidocaine (XYLOCAINE) 5 % ointment Apply 1  Application topically as needed. 35.44 g 0   metoprolol succinate (TOPROL-XL) 25 MG 24 hr tablet Take 1 tablet (25 mg total) by mouth daily.     prednisoLONE acetate (PRED FORTE) 1 % ophthalmic suspension Place 1 drop into the right eye 4 (four) times daily.     No current facility-administered medications on file prior to visit.    Allergies  Allergen Reactions   Citalopram Other (See Comments)    Feels odd   Doxycycline Hyclate Nausea And Vomiting   Erythromycin Other (See Comments)    Irritated stomach   Montelukast Sodium Itching   Nitrofurantoin Nausea And Vomiting   Sulfa Antibiotics Nausea And Vomiting   Tramadol Hcl Other (See Comments)    Feels odd   Venlafaxine Hcl Other (See Comments)    Feels odd   Amoxicillin-Pot Clavulanate Nausea And Vomiting   Clarithromycin Rash     See ER note 02/2017    Past Medical History:  Diagnosis Date   Anxiety    Asthma    CVA (cerebral vascular accident) (HCC) 03/2006   right occipital, Dr. 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 EF 60%   CESAREAN SECTION     ESOPHAGOGASTRODUODENOSCOPY  05/2005   TONSILLECTOMY AND ADENOIDECTOMY      Family History  Problem Relation Age of Onset   Coronary artery disease Mother    Diabetes Mellitus II Mother    Heart attack Mother 69   Diabetes Mellitus II Maternal Grandmother     Social History   Socioeconomic History   Marital status: Divorced    Spouse name: Not on file   Number of children: 2   Years of education: Not on file   Highest education level: Not on file  Occupational History   Occupation: LAB TECH    Employer: [employer]  Tobacco Use   Smoking status: Never    Passive exposure: Never   Smokeless tobacco: Never  Vaping Use    Vaping status: Never Used  Substance and Sexual Activity   Alcohol use: No    Alcohol/week: 0.0 standard drinks of alcohol    Comment: wine occassionally   Drug use: No   Sexual activity: Yes    Birth control/protection: Condom  Other Topics Concern   Not on file  Social History Narrative   No living will   Daughter should be health care decision maker   Would accept resuscitation   No tube feeds if cognitively aware   Social Determinants of Health   Financial Resource Strain: High Risk (05/05/2022)   Overall Financial Resource Strain (CARDIA)    Difficulty of Paying Living Expenses: Hard  Food Insecurity: No Food Insecurity (11/24/2022)   Hunger Vital Sign    Worried About Running Out of Food in the Last Year: Never true    Ran Out of Food in the Last Year: Never true  Transportation Needs: No Transportation Needs (11/24/2022)   PRAPARE - Administrator, Civil Service (Medical): No    Lack of Transportation (Non-Medical): No  Physical Activity: Insufficiently Active (05/05/2022)   Exercise Vital Sign    Days of Exercise per Week: 4 days    Minutes of Exercise per Session: 20 min  Stress: Stress Concern Present (05/05/2022)   [employer] of Occupational Health - Occupational Stress Questionnaire    Feeling of Stress : To some extent  Social Connections: Moderately Isolated (05/05/2022)   Social Connection and Isolation Panel [NHANES]    Frequency of Communication with Friends and Family: More than three times a week    Frequency of Social Gatherings with Friends and Family: More than three times a week    Attends Religious Services: 1 to 4 times per year    Active Member of [employer] or Organizations: No    Attends Banker Meetings: Never    Marital Status: Divorced  Catering manager Violence: Not At Risk (11/24/2022)   Humiliation, Afraid, Rape, and Kick questionnaire    Fear of Current or Ex-Partner: No    Emotionally Abused: No    Physically  Abused: No    Sexually Abused: No   Review of Systems Avoiding soft drinks Weight up slightly    Objective:   Physical Exam Constitutional:      Appearance: Normal appearance.  Cardiovascular:     Rate and Rhythm: Normal rate and regular rhythm.     Heart sounds: No murmur heard.    No gallop.  Pulmonary:     Effort: Pulmonary effort is normal.     Breath sounds:  Normal breath sounds. No wheezing or rales.  Musculoskeletal:     Cervical back: Neck supple.     Right lower leg: No edema.     Left lower leg: No edema.  Lymphadenopathy:     Cervical: No cervical adenopathy.  Neurological:     Mental Status: She is alert.            Assessment & Plan:

## 2023-01-06 NOTE — Assessment & Plan Note (Signed)
Now on amoxil awaiting extractions Did clear for the procedure---but requested no cessation of ASA or plavix if possible

## 2023-01-06 NOTE — Assessment & Plan Note (Signed)
BP Readings from Last 3 Encounters:  01/06/23 138/84  12/12/22 (!) 140/69  12/09/22 138/80   Some better but not on valsartan Will try low dose lisinopril 10mg  daily--amlodpine 10/metoprolol 25 daily also

## 2023-01-06 NOTE — Assessment & Plan Note (Signed)
Lab Results  Component Value Date   HGBA1C 7.4 (H) 11/24/2022   Acceptable control on glipizide 5mg  daily Recheck at next visit

## 2023-01-06 NOTE — Assessment & Plan Note (Signed)
No new symptoms Is on ASA/plavix--statin

## 2023-01-16 ENCOUNTER — Other Ambulatory Visit: Payer: Self-pay | Admitting: Student in an Organized Health Care Education/Training Program

## 2023-01-17 ENCOUNTER — Encounter: Payer: Self-pay | Admitting: General Practice

## 2023-01-17 ENCOUNTER — Ambulatory Visit (INDEPENDENT_AMBULATORY_CARE_PROVIDER_SITE_OTHER): Payer: Medicare HMO | Admitting: General Practice

## 2023-01-17 ENCOUNTER — Ambulatory Visit (INDEPENDENT_AMBULATORY_CARE_PROVIDER_SITE_OTHER)
Admission: RE | Admit: 2023-01-17 | Discharge: 2023-01-17 | Disposition: A | Discharge status: Home / Self Care | Payer: Medicare HMO | Source: Ambulatory Visit | Attending: General Practice | Admitting: General Practice

## 2023-01-17 VITALS — BP 132/82 | HR 98 | Temp 98.2°F | Ht 63.0 in | Wt 233.0 lb

## 2023-01-17 DIAGNOSIS — M79671 Pain in right foot: Secondary | ICD-10-CM

## 2023-01-17 DIAGNOSIS — M79604 Pain in right leg: Secondary | ICD-10-CM

## 2023-01-17 DIAGNOSIS — R2241 Localized swelling, mass and lump, right lower limb: Secondary | ICD-10-CM

## 2023-01-17 DIAGNOSIS — M25571 Pain in right ankle and joints of right foot: Secondary | ICD-10-CM | POA: Diagnosis not present

## 2023-01-17 DIAGNOSIS — M85861 Other specified disorders of bone density and structure, right lower leg: Secondary | ICD-10-CM | POA: Diagnosis not present

## 2023-01-17 DIAGNOSIS — M85871 Other specified disorders of bone density and structure, right ankle and foot: Secondary | ICD-10-CM | POA: Diagnosis not present

## 2023-01-17 DIAGNOSIS — M7989 Other specified soft tissue disorders: Secondary | ICD-10-CM | POA: Diagnosis not present

## 2023-01-17 MED ORDER — ACETAMINOPHEN-CODEINE 300-30 MG PO TABS: 2.0000 | ORAL_TABLET | Freq: Four times a day (QID) | ORAL | 0 refills | Status: AC | PRN

## 2023-01-17 NOTE — Assessment & Plan Note (Addendum)
Differential include stress fracture, neuropathy  Given the amount of pain she is in, will go ahead and get x-ray of her leg, foot and ankle.   Unable to treat with NSAIDs, as she has a history of CVA and is currently on Plavix.   She has an allergy to tramadol but she reports that this was a long time ago. Would like to try the tylenol with codeine for pain. Rx sent for acetaminophen-codeine 300-30mg  take 2 tablets every 6 hours as needed.  Patient educated about the contraindications with this medication and her anti-anxiety medication and to avoid taking them together.   X-ray of her right foot, ankle and tib/fib pending. Consider physical therapy if x-ray is negative.

## 2023-01-17 NOTE — Progress Notes (Signed)
Established Patient Office Visit  Subjective   Patient ID: Tami Jones, female    DOB: Jan 09, 1956  Age: 67 y.o. MRN: [id number]  Chief Complaint  Patient presents with   foot pain    Patient had a fall 2 days ago and injured right foot. Patient states foot is swollen some and very painful. Has been taking tylenol for the pain.    HPI  Tami Jones is a 67 year old female, patient of Dr. Alphonsus Sias, with past medical history of type 2 diabetes, HTN, CVA, TIA, Hemiparesis of right dominant side, seizure disorder, who presents today for acute visit.   Right foot pain: Started on 01/15/23 after a fall but she did not hit the ground. Reports her foot is swollen and painful. She is able to bear minimal weight. She uses a cane to ambulate. She describes her pain has shooting pain laterally from the end of her leg to her ankle to her toes. Pain is worse at night. Feels her shoe is tight on her right foot. She does have a history of CVA and diabetes with neuropathy. Swelling is worse after she is standing for long periods of time. She has tried tylenol OTC 650 mg twice with minimal relief. Currently on Plavix for CVA and xanax for anxiety. She does report using the xanax sparingly with last use being two weeks ago.    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   TIA (transient ischemic attack) 03/25/2022   Asthma, chronic, unspecified asthma severity, with acute exacerbation 03/25/2022   RUQ pain 12/28/2021   Morbid obesity (HCC) 12/28/2021   Seizure disorder (HCC) 12/28/2021   Urinary frequency 11/27/2019   History of CVA with residual deficit 08/25/2019   Anxiety    Dyslipidemia    CKD (chronic kidney disease), stage IIIa    Neuropathic pain, arm 10/26/2018   Hemiparesis of right dominant side (HCC) 08/17/2018   Dental infection 08/07/2014   Cerebrovascular disease, arteriosclerotic,  post-stroke 07/17/2014   Preventative health care 06/25/2014   Right sided sciatica 03/08/2013   UTI (urinary tract infection) 12/09/2012   MENOPAUSAL SYNDROME 09/10/2008   Mood disorder (HCC) 09/15/2007   Essential hypertension, benign 08/10/2006   Type 2 diabetes mellitus with other circulatory complications (HCC) 06/17/2006   HYPERCHOLESTEROLEMIA 06/17/2006   GERD 06/17/2006   Past Medical History:  Diagnosis Date   Anxiety    Asthma    CVA (cerebral vascular accident) (HCC) 03/2006   right occipital, Dr. Thad Ranger   Diabetes mellitus    Expressive dysphasia 03/25/2022   GERD (gastroesophageal reflux disease)    Hyperlipidemia    Hypertension    Hypertensive urgency 03/25/2022   Past Surgical History:  Procedure Laterality Date   CARDIOVASCULAR STRESS TEST  1/15   myoview. EF 60%   CESAREAN SECTION     ESOPHAGOGASTRODUODENOSCOPY  05/2005   TONSILLECTOMY AND ADENOIDECTOMY     Allergies  Allergen Reactions   Citalopram Other (See Comments)    Feels odd   Doxycycline Hyclate Nausea And Vomiting   Erythromycin Other (See Comments)    Irritated stomach   Montelukast Sodium Itching   Nitrofurantoin Nausea And Vomiting   Sulfa Antibiotics Nausea And Vomiting   Tramadol Hcl Other (See Comments)    Feels odd   Venlafaxine Hcl Other (See Comments)    Feels odd   Amoxicillin-Pot Clavulanate Nausea And Vomiting  Clarithromycin Rash     See ER note 02/2017         01/17/2023   10:11 AM 05/05/2022    3:46 PM 12/24/2020   10:33 AM  Depression screen PHQ 2/9  Decreased Interest 1 1 0  Down, Depressed, Hopeless 1 1 0  PHQ - 2 Score 2 2 0  Altered sleeping 1 1   Tired, decreased energy 1 1   Change in appetite 1 1   Feeling bad or failure about yourself  1 1   Trouble concentrating 1 1   Moving slowly or fidgety/restless 0 0   Suicidal thoughts 0 0   PHQ-9 Score 7 7   Difficult doing work/chores Somewhat difficult Somewhat difficult         No data to display             Review of Systems  Constitutional:  Negative for fever.  Respiratory:  Negative for shortness of breath.   Cardiovascular:  Negative for chest pain.  Musculoskeletal:  Positive for falls and joint pain.       Right foot pain   Neurological:  Negative for dizziness.      Objective:     BP 132/82 (BP Location: Left Arm, Patient Position: Sitting, Cuff Size: Large)   Pulse 98   Temp 98.2 F (36.8 C) (Oral)   Ht 5\' 3"  (1.6 m)   Wt 233 lb (105.7 kg)   SpO2 99%   BMI 41.27 kg/m  BP Readings from Last 3 Encounters:  01/17/23 132/82  01/06/23 138/84  12/12/22 (!) 140/69   Wt Readings from Last 3 Encounters:  01/17/23 233 lb (105.7 kg)  01/06/23 236 lb (107 kg)  12/12/22 232 lb (105.2 kg)      Physical Exam Vitals and nursing note reviewed.  Constitutional:      Appearance: Normal appearance.  Cardiovascular:     Rate and Rhythm: Normal rate and regular rhythm.     Pulses: Normal pulses.          Dorsalis pedis pulses are 2+ on the right side and 2+ on the left side.       Posterior tibial pulses are 2+ on the right side and 2+ on the left side.     Heart sounds: Normal heart sounds.  Pulmonary:     Effort: Pulmonary effort is normal.     Breath sounds: Normal breath sounds.  Musculoskeletal:        General: Swelling present.     Right lower leg: 1+ Edema present.     Left lower leg: No edema.     Comments: Mild right foot swelling  Skin:    General: Skin is warm and dry.  Neurological:     Mental Status: She is alert and oriented to person, place, and time.  Psychiatric:        Mood and Affect: Mood normal.        Behavior: Behavior normal.      No results found for any visits on 01/17/23.     The ASCVD Risk score (Arnett DK, et al., 2019) failed to calculate for the following reasons:   The patient has a prior MI or stroke diagnosis    Assessment & Plan:  Foot pain, right Assessment & Plan: Differential include stress fracture,  neuropathy  Given the amount of pain she is in, will go ahead and get x-ray of her leg, foot and ankle.   Unable to treat with NSAIDs,  as she has a history of CVA and is currently on Plavix.   She has an allergy to tramadol but she reports that this was a long time ago. Would like to try the tylenol with codeine for pain. Rx sent for acetaminophen-codeine 300-30mg  take 2 tablets every 6 hours as needed.  Patient educated about the contraindications with this medication and her anti-anxiety medication and to avoid taking them together.   X-ray of her right foot, ankle and tib/fib pending. Consider physical therapy if x-ray is negative.     Orders: -     DG Tibia/Fibula Right -     DG Ankle Complete Right -     DG Foot Complete Right  Other orders -     Acetaminophen-Codeine; Take 2 tablets by mouth every 6 (six) hours as needed for up to 3 days for moderate pain (pain score 4-6).  Dispense: 24 tablet; Refill: 0     No follow-ups on file.    Modesto Charon, NP

## 2023-01-17 NOTE — Patient Instructions (Addendum)
Complete xray(s) prior to leaving today. I will notify you of your results once received.  Apply a compressive ACE bandage. Rest and elevate the affected painful area.  Apply cold compresses intermittently as needed.  As pain recedes, begin normal activities slowly as tolerated.  Call if symptoms persist.  You can use compression stockings as well to help with swelling.   Start tylenol with codeine. Take two tablets every six hours as needed. Please do not take this with the anxiety medication called xanax.   Do not exceed more than 4000 mg of tylenol in one day.   Please update if your symptoms worsen or do not improve.   It was a pleasure meeting you!

## 2023-01-21 ENCOUNTER — Telehealth: Payer: Self-pay | Admitting: Internal Medicine

## 2023-01-21 ENCOUNTER — Other Ambulatory Visit: Payer: Self-pay | Admitting: Internal Medicine

## 2023-01-21 MED ORDER — AMOXICILLIN 500 MG PO TABS: 1000.0000 mg | ORAL_TABLET | Freq: Two times a day (BID) | ORAL | 1 refills | Status: DC

## 2023-01-21 NOTE — Telephone Encounter (Signed)
Tried to contact pt to let her know the medication was sent in for her, but her VM is full. So I could not leave her a message.

## 2023-01-21 NOTE — Telephone Encounter (Signed)
Prescription Request  01/21/2023  LOV: 01/06/2023  What is the name of the medication or equipment? amoxicillin (AMOXIL) 500 MG tablet   Have you contacted your pharmacy to request a refill? No   Which pharmacy would you like this sent to?  [facility] [address], [place] - [address] [place] [address] Phone: [phone] Fax: [phone]    Patient notified that their request is being sent to the clinical staff for review and that they should receive a response within 2 business days.   Please advise at Mobile [phone] (mobile)  Pt states her dental surgery was schedule for 12/12 but was r/s due to the pt being sick & having a bad cold. Pt state she needs more meds due to not getting the tooth pulled.

## 2023-01-21 NOTE — Telephone Encounter (Signed)
Rx sent 

## 2023-01-21 NOTE — Addendum Note (Signed)
Addended by: Tillman Abide I on: 01/21/2023 01:20 PM   Modules accepted: Orders

## 2023-01-25 ENCOUNTER — Encounter: Payer: Self-pay | Admitting: Internal Medicine

## 2023-01-25 ENCOUNTER — Ambulatory Visit (INDEPENDENT_AMBULATORY_CARE_PROVIDER_SITE_OTHER): Payer: Medicare HMO | Admitting: Internal Medicine

## 2023-01-25 VITALS — BP 124/80 | HR 90 | Temp 98.3°F | Ht 63.0 in | Wt 226.0 lb

## 2023-01-25 DIAGNOSIS — M5410 Radiculopathy, site unspecified: Secondary | ICD-10-CM | POA: Insufficient documentation

## 2023-01-25 MED ORDER — PREDNISONE 20 MG PO TABS: 40.0000 mg | ORAL_TABLET | Freq: Every day | ORAL | 0 refills | Status: DC

## 2023-01-25 MED ORDER — GABAPENTIN 300 MG PO CAPS: 300.0000 mg | ORAL_CAPSULE | Freq: Every day | ORAL | 3 refills | Status: DC

## 2023-01-25 NOTE — Assessment & Plan Note (Signed)
May be sciatica Will cautiously try prednisone for a few days--- 40 x 3, 20 x 6 Try gabapentin at night (has had it before)

## 2023-01-25 NOTE — Progress Notes (Signed)
Subjective:    Patient ID: Tami Jones, female    DOB: 1955/08/12, 67 y.o.   MRN: [id number]  HPI Here due to several concerns  Having pain in right foot and moves up leg--even up to hip (and tailbone) Burning type pain and stinging in fingers Some throbbing in fingers as well Foot is worse at night--keeps her up at times (and turns red) Only started last week Seems better when up and on it  No known injury Some leg weakness Arms feel some weakness as well  Frustrated and discouraged about her overall status  Sugars high in past week 180-190 Not eating much Occasionally down in 120's also though  Current Outpatient Medications on File Prior to Visit  Medication Sig Dispense Refill   acetaminophen (TYLENOL) 325 MG tablet Take 650 mg by mouth every 6 (six) hours as needed.     ALPRAZolam (XANAX) 0.25 MG tablet Take 1 tablet (0.25 mg total) by mouth 3 (three) times daily as needed for anxiety or sleep. 30 tablet 0   amLODipine (NORVASC) 10 MG tablet Take 1 tablet (10 mg total) by mouth every evening. 90 tablet 3   amoxicillin (AMOXIL) 500 MG tablet Take 2 tablets (1,000 mg total) by mouth 2 (two) times daily. 40 tablet 1   aspirin EC 81 MG tablet Take 81 mg by mouth every evening.      atorvastatin (LIPITOR) 80 MG tablet TAKE 1 TABLET BY MOUTH EVERY EVENING 90 tablet 3   clopidogrel (PLAVIX) 75 MG tablet TAKE 1 TABLET BY MOUTH EVERY DAY 90 tablet 3   gatifloxacin (ZYMAXID) 0.5 % SOLN Place 1 drop into the right eye 4 (four) times daily.     glipiZIDE (GLUCOTROL XL) 5 MG 24 hr tablet TAKE 1 TABLET (5 MG TOTAL) BY MOUTH IN THE MORNING AND AT BEDTIME 180 tablet 3   glucose blood test strip Use daily as instructed 100 each 3   ketoconazole (NIZORAL) 2 % cream Apply 1 Application topically 2 (two) times daily as needed for irritation. 60 g 1   ketorolac (ACULAR) 0.5 % ophthalmic solution Place 1 drop into the right eye 4 (four) times daily.     lidocaine (XYLOCAINE) 5 % ointment  Apply 1 Application topically as needed. 35.44 g 0   lisinopril (ZESTRIL) 10 MG tablet Take 1 tablet (10 mg total) by mouth daily. 90 tablet 3   metoprolol succinate (TOPROL-XL) 25 MG 24 hr tablet Take 1 tablet (25 mg total) by mouth daily.     prednisoLONE acetate (PRED FORTE) 1 % ophthalmic suspension Place 1 drop into the right eye 4 (four) times daily.     No current facility-administered medications on file prior to visit.    Allergies  Allergen Reactions   Citalopram Other (See Comments)    Feels odd   Doxycycline Hyclate Nausea And Vomiting   Erythromycin Other (See Comments)    Irritated stomach   Montelukast Sodium Itching   Nitrofurantoin Nausea And Vomiting   Sulfa Antibiotics Nausea And Vomiting   Tramadol Hcl Other (See Comments)    Feels odd   Venlafaxine Hcl Other (See Comments)    Feels odd   Amoxicillin-Pot Clavulanate Nausea And Vomiting   Clarithromycin Rash     See ER note 02/2017    Past Medical History:  Diagnosis Date   Anxiety    Asthma    CVA (cerebral vascular accident) (HCC) 03/2006   right occipital, Dr. Thad Ranger   Diabetes mellitus  Expressive dysphasia 03/25/2022   GERD (gastroesophageal reflux disease)    Hyperlipidemia    Hypertension    Hypertensive urgency 03/25/2022    Past Surgical History:  Procedure Laterality Date   CARDIOVASCULAR STRESS TEST  1/15   myoview. EF 60%   CESAREAN SECTION     ESOPHAGOGASTRODUODENOSCOPY  05/2005   TONSILLECTOMY AND ADENOIDECTOMY      Family History  Problem Relation Age of Onset   Coronary artery disease Mother    Diabetes Mellitus II Mother    Heart attack Mother 38   Diabetes Mellitus II Maternal Grandmother     Social History   Socioeconomic History   Marital status: Divorced    Spouse name: Not on file   Number of children: 2   Years of education: Not on file   Highest education level: Not on file  Occupational History   Occupation: LAB TECH    Employer: [employer]  Tobacco Use    Smoking status: Never    Passive exposure: Never   Smokeless tobacco: Never  Vaping Use   Vaping status: Never Used  Substance and Sexual Activity   Alcohol use: No    Alcohol/week: 0.0 standard drinks of alcohol    Comment: wine occassionally   Drug use: No   Sexual activity: Yes    Birth control/protection: Condom  Other Topics Concern   Not on file  Social History Narrative   No living will   Daughter should be health care decision maker   Would accept resuscitation   No tube feeds if cognitively aware   Social Determinants of Health   Financial Resource Strain: High Risk (05/05/2022)   Overall Financial Resource Strain (CARDIA)    Difficulty of Paying Living Expenses: Hard  Food Insecurity: No Food Insecurity (11/24/2022)   Hunger Vital Sign    Worried About Running Out of Food in the Last Year: Never true    Ran Out of Food in the Last Year: Never true  Transportation Needs: No Transportation Needs (11/24/2022)   PRAPARE - Administrator, Civil Service (Medical): No    Lack of Transportation (Non-Medical): No  Physical Activity: Insufficiently Active (05/05/2022)   Exercise Vital Sign    Days of Exercise per Week: 4 days    Minutes of Exercise per Session: 20 min  Stress: Stress Concern Present (05/05/2022)   [employer] of Occupational Health - Occupational Stress Questionnaire    Feeling of Stress : To some extent  Social Connections: Moderately Isolated (05/05/2022)   Social Connection and Isolation Panel [NHANES]    Frequency of Communication with Friends and Family: More than three times a week    Frequency of Social Gatherings with Friends and Family: More than three times a week    Attends Religious Services: 1 to 4 times per year    Active Member of [employer] or Organizations: No    Attends Banker Meetings: Never    Marital Status: Divorced  Catering manager Violence: Not At Risk (11/24/2022)   Humiliation, Afraid, Rape, and Kick  questionnaire    Fear of Current or Ex-Partner: No    Emotionally Abused: No    Physically Abused: No    Sexually Abused: No   Review of Systems Dental work had to be rescheduled for later this week Some headache in past 3 days    Objective:   Physical Exam Constitutional:      Appearance: Normal appearance.  Musculoskeletal:     Comments: Slight lower  lumbar sensitivity Normal ROM at right hip---knee quiet SLR basically negative---some posterior thigh pain at full flexion  Neurological:     Mental Status: She is alert.     Comments: Antalgic gait Mild weakness in right hip flexors--seems to be pain related            Assessment & Plan:

## 2023-03-14 ENCOUNTER — Other Ambulatory Visit: Payer: Self-pay | Admitting: Internal Medicine

## 2023-03-16 ENCOUNTER — Other Ambulatory Visit: Payer: Self-pay | Admitting: Internal Medicine

## 2023-03-16 ENCOUNTER — Other Ambulatory Visit: Payer: Self-pay | Admitting: Student in an Organized Health Care Education/Training Program

## 2023-03-16 NOTE — Telephone Encounter (Signed)
Requesting: metoprolol succinate (TOPROL-XL) 25 MG 24 hr tablet  Last Visit: 01/25/2023 Next Visit: 04/08/2023 Last Refill: 11/26/2022  Please Advise   End date 01/25/23

## 2023-03-16 NOTE — Telephone Encounter (Signed)
Copied from CRM [id number]. Topic: Clinical - Medication Refill >> Mar 16, 2023 11:51 AM Drema Balzarine wrote: Most Recent Primary Care Visit:  Provider: Tillman Abide I  Department: [facility]  Visit Type: OFFICE VISIT  Date: 01/25/2023  Medication: metoprolol succinate (TOPROL-XL) 25 MG 24 hr tablet [[id number]]  ENDED  Has the patient contacted their pharmacy? Yes, patient is at pharmacy  (Agent: If no, request that the patient contact the pharmacy for the refill. If patient does not wish to contact the pharmacy document the reason why and proceed with request.) (Agent: If yes, when and what did the pharmacy advise?)  Is this the correct pharmacy for this prescription? Yes If no, delete pharmacy and type the correct one.  This is the patient's preferred pharmacy:   [facility] [address] [place] - [address] [place] [address] Phone: [phone] Fax: [phone]   Has the prescription been filled recently? Yes  Is the patient out of the medication? No, patient has 5 days left   Has the patient been seen for an appointment in the last year OR does the patient have an upcoming appointment? Yes  Can we respond through MyChart? No  Agent: Please be advised that Rx refills may take up to 3 business days. We ask that you follow-up with your pharmacy.

## 2023-03-17 MED ORDER — METOPROLOL SUCCINATE ER 25 MG PO TB24: 25.0000 mg | ORAL_TABLET | Freq: Every day | ORAL | 3 refills | Status: DC

## 2023-03-18 ENCOUNTER — Telehealth: Payer: Self-pay | Admitting: Internal Medicine

## 2023-03-18 ENCOUNTER — Other Ambulatory Visit: Payer: Self-pay | Admitting: Student in an Organized Health Care Education/Training Program

## 2023-03-18 MED ORDER — METOPROLOL SUCCINATE ER 25 MG PO TB24: 25.0000 mg | ORAL_TABLET | Freq: Every day | ORAL | 3 refills | Status: DC

## 2023-03-18 NOTE — Telephone Encounter (Signed)
Rx sent electronically.  

## 2023-03-18 NOTE — Telephone Encounter (Signed)
Pt called asking for update on med refill for metoprolol succinate (TOPROL-XL) 25 MG 24 hr tablet. I didn't see where rx was sent to. Pt asked [facility] [address] Call back # [phone]

## 2023-03-24 ENCOUNTER — Ambulatory Visit: Payer: Medicare Other | Admitting: Internal Medicine

## 2023-03-24 ENCOUNTER — Encounter: Payer: Self-pay | Admitting: Internal Medicine

## 2023-03-24 VITALS — BP 122/88 | HR 72 | Temp 98.5°F | Ht 63.0 in | Wt 220.0 lb

## 2023-03-24 DIAGNOSIS — I10 Essential (primary) hypertension: Secondary | ICD-10-CM

## 2023-03-24 DIAGNOSIS — E1159 Type 2 diabetes mellitus with other circulatory complications: Secondary | ICD-10-CM | POA: Diagnosis not present

## 2023-03-24 DIAGNOSIS — I69351 Hemiplegia and hemiparesis following cerebral infarction affecting right dominant side: Secondary | ICD-10-CM

## 2023-03-24 DIAGNOSIS — K047 Periapical abscess without sinus: Secondary | ICD-10-CM

## 2023-03-24 DIAGNOSIS — Z7984 Long term (current) use of oral hypoglycemic drugs: Secondary | ICD-10-CM

## 2023-03-24 LAB — POCT GLYCOSYLATED HEMOGLOBIN (HGB A1C): Hemoglobin A1C: 6.8 % — AB (ref 4.0–5.6)

## 2023-03-24 MED ORDER — AMLODIPINE BESYLATE 10 MG PO TABS: 10.0000 mg | ORAL_TABLET | Freq: Every day | ORAL | 3 refills | Status: DC

## 2023-03-24 NOTE — Assessment & Plan Note (Signed)
 BP Readings from Last 3 Encounters:  03/24/23 122/88  01/25/23 124/80  01/17/23 132/82   Discussed trying amlodipine  10mg  in AM Lisinopril  10, metoprolol  25

## 2023-03-24 NOTE — Assessment & Plan Note (Signed)
 May be cause of ear pain Going back to dentist

## 2023-03-24 NOTE — Progress Notes (Signed)
 Subjective:    Patient ID: Tami Jones, female    DOB: [birth year], 68 y.o.   MRN: [id number]  HPI Here for follow up of diabetes and other chronic health issues  Having trouble with right leg and foot She thinks it is neuropathy Worst lower medial calf---and stiffness in toes Didn't like the gabapentin  Using OTC cream at night--some help---?lidocaine   Did have the dental extractions--some Still needs others done Having some left ear pain now  Checking sugars daily 130-140 mostly--rarely higher Has changed diet and lost some weight Joined the Y---trying water exercise  Feels like the amlodipine  is making her feel bad Uses it at night No problems with lisinopril   Still weak and needs cane  Current Outpatient Medications on File Prior to Visit  Medication Sig Dispense Refill   acetaminophen  (TYLENOL ) 325 MG tablet Take 650 mg by mouth every 6 (six) hours as needed.     ALPRAZolam  (XANAX ) 0.25 MG tablet Take 1 tablet (0.25 mg total) by mouth 3 (three) times daily as needed for anxiety or sleep. 30 tablet 0   amLODipine  (NORVASC ) 10 MG tablet Take 1 tablet (10 mg total) by mouth every evening. 90 tablet 3   aspirin  EC 81 MG tablet Take 81 mg by mouth every evening.      atorvastatin  (LIPITOR ) 80 MG tablet TAKE 1 TABLET BY MOUTH EVERY EVENING 90 tablet 3   clopidogrel  (PLAVIX ) 75 MG tablet TAKE 1 TABLET BY MOUTH EVERY DAY 90 tablet 3   glipiZIDE  (GLUCOTROL  XL) 5 MG 24 hr tablet TAKE 1 TABLET (5 MG TOTAL) BY MOUTH IN THE MORNING AND AT BEDTIME 180 tablet 3   glucose blood test strip Use daily as instructed 100 each 3   ketoconazole  (NIZORAL ) 2 % cream Apply 1 Application topically 2 (two) times daily as needed for irritation. 60 g 1   lidocaine  (XYLOCAINE ) 5 % ointment Apply 1 Application topically as needed. 35.44 g 0   lisinopril  (ZESTRIL ) 10 MG tablet Take 1 tablet (10 mg total) by mouth daily. 90 tablet 3   metoprolol  succinate (TOPROL -XL) 25 MG 24 hr tablet Take 1 tablet  (25 mg total) by mouth daily. 90 tablet 3   No current facility-administered medications on file prior to visit.    Allergies  Allergen Reactions   Citalopram  Other (See Comments)    Feels odd   Doxycycline Hyclate Nausea And Vomiting   Erythromycin Other (See Comments)    Irritated stomach   Montelukast Sodium Itching   Nitrofurantoin Nausea And Vomiting   Sulfa Antibiotics Nausea And Vomiting   Tramadol Hcl Other (See Comments)    Feels odd   Venlafaxine  Hcl Other (See Comments)    Feels odd   Amoxicillin -Pot Clavulanate Nausea And Vomiting   Clarithromycin  Rash     See ER note 02/2017    Past Medical History:  Diagnosis Date   Anxiety    Asthma    CVA (cerebral vascular accident) (HCC) 03/2006   right occipital, Dr. Germaine   Diabetes mellitus    Expressive dysphasia 03/25/2022   GERD (gastroesophageal reflux disease)    Hyperlipidemia    Hypertension    Hypertensive urgency 03/25/2022    Past Surgical History:  Procedure Laterality Date   CARDIOVASCULAR STRESS TEST  1/15   myoview. EF 60%   CESAREAN SECTION     ESOPHAGOGASTRODUODENOSCOPY  05/2005   TONSILLECTOMY AND ADENOIDECTOMY      Family History  Problem Relation Age of Onset   Coronary  artery disease Mother    Diabetes Mellitus II Mother    Heart attack Mother 90   Diabetes Mellitus II Maternal Grandmother     Social History   Socioeconomic History   Marital status: Divorced    Spouse name: Not on file   Number of children: 2   Years of education: Not on file   Highest education level: Not on file  Occupational History   Occupation: LAB TECH    Employer: [employer]  Tobacco Use   Smoking status: Never    Passive exposure: Never   Smokeless tobacco: Never  Vaping Use   Vaping status: Never Used  Substance and Sexual Activity   Alcohol use: No    Alcohol/week: 0.0 standard drinks of alcohol    Comment: wine occassionally   Drug use: No   Sexual activity: Yes    Birth  control/protection: Condom  Other Topics Concern   Not on file  Social History Narrative   No living will   Daughter should be health care decision maker   Would accept resuscitation   No tube feeds if cognitively aware   Social Drivers of Health   Financial Resource Strain: High Risk (05/05/2022)   Overall Financial Resource Strain (CARDIA)    Difficulty of Paying Living Expenses: Hard  Food Insecurity: No Food Insecurity (11/24/2022)   Hunger Vital Sign    Worried About Running Out of Food in the Last Year: Never true    Ran Out of Food in the Last Year: Never true  Transportation Needs: No Transportation Needs (11/24/2022)   PRAPARE - Administrator, Civil Service (Medical): No    Lack of Transportation (Non-Medical): No  Physical Activity: Insufficiently Active (05/05/2022)   Exercise Vital Sign    Days of Exercise per Week: 4 days    Minutes of Exercise per Session: 20 min  Stress: Stress Concern Present (05/05/2022)   [employer] of Occupational Health - Occupational Stress Questionnaire    Feeling of Stress : To some extent  Social Connections: Moderately Isolated (05/05/2022)   Social Connection and Isolation Panel [NHANES]    Frequency of Communication with Friends and Family: More than three times a week    Frequency of Social Gatherings with Friends and Family: More than three times a week    Attends Religious Services: 1 to 4 times per year    Active Member of [employer] or Organizations: No    Attends Banker Meetings: Never    Marital Status: Divorced  Catering Manager Violence: Not At Risk (11/24/2022)   Humiliation, Afraid, Rape, and Kick questionnaire    Fear of Current or Ex-Partner: No    Emotionally Abused: No    Physically Abused: No    Sexually Abused: No   Review of Systems Still not sleeping well Bowels move okay     Objective:   Physical Exam Constitutional:      Appearance: Normal appearance.  HENT:     Right Ear:  Tympanic membrane and ear canal normal.     Left Ear: Tympanic membrane and ear canal normal.  Cardiovascular:     Rate and Rhythm: Normal rate and regular rhythm.     Heart sounds: No murmur heard.    No gallop.     Comments: Faint pedal pulses Pulmonary:     Effort: Pulmonary effort is normal.     Breath sounds: Normal breath sounds. No wheezing or rales.  Musculoskeletal:     Cervical back: Neck  supple.     Right lower leg: No edema.     Left lower leg: No edema.  Lymphadenopathy:     Cervical: No cervical adenopathy.  Skin:    Comments: No foot lesions  Neurological:     Mental Status: She is alert.  Psychiatric:        Mood and Affect: Mood normal.        Behavior: Behavior normal.            Assessment & Plan:

## 2023-03-24 NOTE — Patient Instructions (Signed)
 You can try alpha lipoic acid (over the counter) for the neuropathy

## 2023-03-24 NOTE — Assessment & Plan Note (Signed)
 On asa and plavix  statin

## 2023-03-24 NOTE — Assessment & Plan Note (Signed)
 Lab Results  Component Value Date   HGBA1C 6.8 (A) 03/24/2023   Control is better--mostly lifestyle change Glipizide  5 bid On statin

## 2023-04-08 ENCOUNTER — Ambulatory Visit: Payer: Medicare HMO | Admitting: Internal Medicine

## 2023-04-12 ENCOUNTER — Encounter (HOSPITAL_COMMUNITY): Payer: Self-pay | Admitting: Emergency Medicine

## 2023-04-12 ENCOUNTER — Other Ambulatory Visit: Payer: Self-pay

## 2023-04-12 ENCOUNTER — Ambulatory Visit: Payer: Self-pay | Admitting: Internal Medicine

## 2023-04-12 ENCOUNTER — Inpatient Hospital Stay (HOSPITAL_COMMUNITY)
Admission: EM | Admit: 2023-04-12 | Discharge: 2023-04-15 | DRG: 041 | Disposition: A | Discharge status: Home Health | Payer: Medicare Other | Attending: Family Medicine | Admitting: Family Medicine

## 2023-04-12 ENCOUNTER — Emergency Department (HOSPITAL_COMMUNITY): Payer: Medicare Other

## 2023-04-12 DIAGNOSIS — R29898 Other symptoms and signs involving the musculoskeletal system: Secondary | ICD-10-CM | POA: Diagnosis not present

## 2023-04-12 DIAGNOSIS — K219 Gastro-esophageal reflux disease without esophagitis: Secondary | ICD-10-CM | POA: Diagnosis present

## 2023-04-12 DIAGNOSIS — R4702 Dysphasia: Secondary | ICD-10-CM | POA: Diagnosis present

## 2023-04-12 DIAGNOSIS — E785 Hyperlipidemia, unspecified: Secondary | ICD-10-CM | POA: Diagnosis present

## 2023-04-12 DIAGNOSIS — I6389 Other cerebral infarction: Secondary | ICD-10-CM | POA: Diagnosis not present

## 2023-04-12 DIAGNOSIS — M4802 Spinal stenosis, cervical region: Secondary | ICD-10-CM | POA: Diagnosis not present

## 2023-04-12 DIAGNOSIS — Z79899 Other long term (current) drug therapy: Secondary | ICD-10-CM

## 2023-04-12 DIAGNOSIS — Z888 Allergy status to other drugs, medicaments and biological substances status: Secondary | ICD-10-CM

## 2023-04-12 DIAGNOSIS — I160 Hypertensive urgency: Secondary | ICD-10-CM | POA: Diagnosis not present

## 2023-04-12 DIAGNOSIS — Z833 Family history of diabetes mellitus: Secondary | ICD-10-CM

## 2023-04-12 DIAGNOSIS — E860 Dehydration: Secondary | ICD-10-CM | POA: Diagnosis not present

## 2023-04-12 DIAGNOSIS — N179 Acute kidney failure, unspecified: Secondary | ICD-10-CM | POA: Diagnosis present

## 2023-04-12 DIAGNOSIS — G8194 Hemiplegia, unspecified affecting left nondominant side: Secondary | ICD-10-CM | POA: Diagnosis not present

## 2023-04-12 DIAGNOSIS — Z8249 Family history of ischemic heart disease and other diseases of the circulatory system: Secondary | ICD-10-CM

## 2023-04-12 DIAGNOSIS — R29703 NIHSS score 3: Secondary | ICD-10-CM | POA: Diagnosis present

## 2023-04-12 DIAGNOSIS — N1831 Chronic kidney disease, stage 3a: Secondary | ICD-10-CM | POA: Diagnosis present

## 2023-04-12 DIAGNOSIS — Z7982 Long term (current) use of aspirin: Secondary | ICD-10-CM

## 2023-04-12 DIAGNOSIS — I6523 Occlusion and stenosis of bilateral carotid arteries: Secondary | ICD-10-CM | POA: Diagnosis not present

## 2023-04-12 DIAGNOSIS — E1122 Type 2 diabetes mellitus with diabetic chronic kidney disease: Secondary | ICD-10-CM | POA: Diagnosis not present

## 2023-04-12 DIAGNOSIS — R2689 Other abnormalities of gait and mobility: Secondary | ICD-10-CM | POA: Diagnosis present

## 2023-04-12 DIAGNOSIS — I63521 Cerebral infarction due to unspecified occlusion or stenosis of right anterior cerebral artery: Secondary | ICD-10-CM | POA: Diagnosis not present

## 2023-04-12 DIAGNOSIS — I639 Cerebral infarction, unspecified: Principal | ICD-10-CM | POA: Diagnosis present

## 2023-04-12 DIAGNOSIS — M50223 Other cervical disc displacement at C6-C7 level: Secondary | ICD-10-CM | POA: Diagnosis not present

## 2023-04-12 DIAGNOSIS — I6501 Occlusion and stenosis of right vertebral artery: Secondary | ICD-10-CM | POA: Diagnosis not present

## 2023-04-12 DIAGNOSIS — Z7902 Long term (current) use of antithrombotics/antiplatelets: Secondary | ICD-10-CM

## 2023-04-12 DIAGNOSIS — M50222 Other cervical disc displacement at C5-C6 level: Secondary | ICD-10-CM | POA: Diagnosis not present

## 2023-04-12 DIAGNOSIS — I63511 Cerebral infarction due to unspecified occlusion or stenosis of right middle cerebral artery: Secondary | ICD-10-CM | POA: Diagnosis not present

## 2023-04-12 DIAGNOSIS — E1140 Type 2 diabetes mellitus with diabetic neuropathy, unspecified: Secondary | ICD-10-CM | POA: Diagnosis present

## 2023-04-12 DIAGNOSIS — Z882 Allergy status to sulfonamides status: Secondary | ICD-10-CM

## 2023-04-12 DIAGNOSIS — I63531 Cerebral infarction due to unspecified occlusion or stenosis of right posterior cerebral artery: Secondary | ICD-10-CM | POA: Diagnosis not present

## 2023-04-12 DIAGNOSIS — Z7984 Long term (current) use of oral hypoglycemic drugs: Secondary | ICD-10-CM | POA: Diagnosis not present

## 2023-04-12 DIAGNOSIS — Z885 Allergy status to narcotic agent status: Secondary | ICD-10-CM

## 2023-04-12 DIAGNOSIS — R002 Palpitations: Secondary | ICD-10-CM | POA: Diagnosis not present

## 2023-04-12 DIAGNOSIS — I63411 Cerebral infarction due to embolism of right middle cerebral artery: Secondary | ICD-10-CM | POA: Diagnosis not present

## 2023-04-12 DIAGNOSIS — I129 Hypertensive chronic kidney disease with stage 1 through stage 4 chronic kidney disease, or unspecified chronic kidney disease: Secondary | ICD-10-CM | POA: Diagnosis not present

## 2023-04-12 DIAGNOSIS — M47812 Spondylosis without myelopathy or radiculopathy, cervical region: Secondary | ICD-10-CM | POA: Diagnosis not present

## 2023-04-12 DIAGNOSIS — F419 Anxiety disorder, unspecified: Secondary | ICD-10-CM | POA: Diagnosis present

## 2023-04-12 DIAGNOSIS — F32A Depression, unspecified: Secondary | ICD-10-CM | POA: Diagnosis present

## 2023-04-12 DIAGNOSIS — Z880 Allergy status to penicillin: Secondary | ICD-10-CM

## 2023-04-12 DIAGNOSIS — R29810 Facial weakness: Secondary | ICD-10-CM | POA: Diagnosis not present

## 2023-04-12 DIAGNOSIS — Z881 Allergy status to other antibiotic agents status: Secondary | ICD-10-CM

## 2023-04-12 DIAGNOSIS — I69351 Hemiplegia and hemiparesis following cerebral infarction affecting right dominant side: Secondary | ICD-10-CM | POA: Diagnosis not present

## 2023-04-12 DIAGNOSIS — E669 Obesity, unspecified: Secondary | ICD-10-CM | POA: Diagnosis present

## 2023-04-12 DIAGNOSIS — J45909 Unspecified asthma, uncomplicated: Secondary | ICD-10-CM | POA: Diagnosis not present

## 2023-04-12 DIAGNOSIS — I6782 Cerebral ischemia: Secondary | ICD-10-CM | POA: Diagnosis not present

## 2023-04-12 DIAGNOSIS — Z6837 Body mass index (BMI) 37.0-37.9, adult: Secondary | ICD-10-CM

## 2023-04-12 LAB — ETHANOL: Alcohol, Ethyl (B): <10 mg/dL (ref <10)

## 2023-04-12 LAB — COMPREHENSIVE METABOLIC PANEL
ALT: 23 U/L (ref 0–44)
AST: 24 U/L (ref 15–41)
Albumin: 3.7 g/dL (ref 3.5–5.0)
Alkaline Phosphatase: 61 U/L (ref 38–126)
Anion gap: 13 (ref 5–15)
BUN: 20 mg/dL (ref 8–23)
CO2: 22 mmol/L (ref 22–32)
Calcium: 9 mg/dL (ref 8.9–10.3)
Chloride: 107 mmol/L (ref 98–111)
Creatinine, Ser: 1.42 mg/dL — ABNORMAL HIGH (ref 0.44–1.00)
GFR, Estimated: 41 mL/min — ABNORMAL LOW (ref >60)
Glucose, Bld: 108 mg/dL — ABNORMAL HIGH (ref 70–99)
Potassium: 3.7 mmol/L (ref 3.5–5.1)
Sodium: 142 mmol/L (ref 135–145)
Total Bilirubin: 0.6 mg/dL (ref 0.0–1.2)
Total Protein: 6.4 g/dL — ABNORMAL LOW (ref 6.5–8.1)

## 2023-04-12 LAB — CBC
HCT: 41.4 % (ref 36.0–46.0)
Hemoglobin: 13.9 g/dL (ref 12.0–15.0)
MCH: 31.4 pg (ref 26.0–34.0)
MCHC: 33.6 g/dL (ref 30.0–36.0)
MCV: 93.5 fL (ref 80.0–100.0)
Platelets: 236 10*3/uL (ref 150–400)
RBC: 4.43 MIL/uL (ref 3.87–5.11)
RDW: 12.7 % (ref 11.5–15.5)
WBC: 7.2 10*3/uL (ref 4.0–10.5)
nRBC: 0 % (ref 0.0–0.2)

## 2023-04-12 LAB — DIFFERENTIAL
Abs Immature Granulocytes: 0.01 10*3/uL (ref 0.00–0.07)
Basophils Absolute: 0 10*3/uL (ref 0.0–0.1)
Basophils Relative: 0 %
Eosinophils Absolute: 0 10*3/uL (ref 0.0–0.5)
Eosinophils Relative: 1 %
Immature Granulocytes: 0 %
Lymphocytes Relative: 32 %
Lymphs Abs: 2.3 10*3/uL (ref 0.7–4.0)
Monocytes Absolute: 0.5 10*3/uL (ref 0.1–1.0)
Monocytes Relative: 7 %
Neutro Abs: 4.3 10*3/uL (ref 1.7–7.7)
Neutrophils Relative %: 60 %

## 2023-04-12 LAB — PROTIME-INR
INR: 1 (ref 0.8–1.2)
Prothrombin Time: 13.3 s (ref 11.4–15.2)

## 2023-04-12 LAB — I-STAT CHEM 8, ED
BUN: 22 mg/dL (ref 8–23)
Calcium, Ion: 1.13 mmol/L — ABNORMAL LOW (ref 1.15–1.40)
Chloride: 108 mmol/L (ref 98–111)
Creatinine, Ser: 1.5 mg/dL — ABNORMAL HIGH (ref 0.44–1.00)
Glucose, Bld: 101 mg/dL — ABNORMAL HIGH (ref 70–99)
HCT: 46 % (ref 36.0–46.0)
Hemoglobin: 15.6 g/dL — ABNORMAL HIGH (ref 12.0–15.0)
Potassium: 3.6 mmol/L (ref 3.5–5.1)
Sodium: 142 mmol/L (ref 135–145)
TCO2: 24 mmol/L (ref 22–32)

## 2023-04-12 LAB — APTT: aPTT: 26 s (ref 24–36)

## 2023-04-12 MED ORDER — IOHEXOL 350 MG/ML SOLN
75.0000 mL | Freq: Once | INTRAVENOUS | Status: AC | PRN
  Administered 2023-04-12: 75 mL via INTRAVENOUS

## 2023-04-12 NOTE — ED Triage Notes (Signed)
 Pt arrived POV for LUE weakness. Pt states she woke up with symptoms yesterday morning. Hx of stroke with right-sided residual deficits. On Plavix and ASA. No drift noted on exam. Pt states she also feels a "pulling" sensation to the left side of face and neck.

## 2023-04-12 NOTE — ED Provider Triage Note (Addendum)
 Emergency Medicine Provider Triage Evaluation Note  Tami Jones , a 68 y.o. female  was evaluated in triage.  Pt complains of left upper extremity weakness that began yesterday morning when she woke up.  Patient states that her symptoms of persisted throughout the day.  She is also endorsing some slight weakness in her left lower extremity.  Denies any numbness.  History of CVA with right-sided deficits.  Also reports that she feels as if the left side of her face is "pulling".  She reports that all this began yesterday on waking around 8 AM.  Denies nausea, vomiting or diarrhea.  Denies chest pain or shortness of breath.  Review of Systems  Positive:  Negative:   Physical Exam  BP (!) 221/99 (BP Location: Right Arm)   Pulse 89   Temp 98.1 F (36.7 C) (Oral)   Resp 18   SpO2 100%  Gen:   Awake, no distress   Resp:  Normal effort  MSK:   Moves extremities without difficulty  Other:  Patient has 3 out of 5 strength of left upper extremity.  4-5 strength left lower extremity.  Patient does have pronator drift of the left.  Medical Decision Making  Medically screening exam initiated at 5:29 PM.  Appropriate orders placed.  Duke Salvia Voshell was informed that the remainder of the evaluation will be completed by another provider, this initial triage assessment does not replace that evaluation, and the importance of remaining in the ED until their evaluation is complete.  Work up initiated.     Al Decant, PA-C 04/12/23 1731

## 2023-04-12 NOTE — Telephone Encounter (Signed)
  Chief Complaint: sudden weakness and numbness to left arm/hand Symptoms: weakness in left arm/hand, numbness in left fingertips, neck pain and headache, dizziness Frequency: dizziness since the end of last week, numbness and weakness to left arm x "just a few minutes" Pertinent Negatives: Patient denies loss of vision, double vision, changes in speech. Disposition: [x] ED /[] Urgent Care (no appt availability in office) / [] Appointment(In office/virtual)/ []  [facility]/ [] Home Care/ [] Refused Recommended Disposition /[]  Mobile Bus/ []  Follow-up with PCP Additional Notes: Patient with history of stroke. Patient states several months ago she was admitted at [facility] and told her she had a TIA. Patient states she does not feel comfortable going back there and she would rather call her daughter first to take her to [facility]. Advised patient to call 911 to take her to ED, patient insists on calling her daughter first. Advised have her daughter take her immediately to ED. Patient states she is agreeable to ED but does not want rescue to take her to [facility] as she feels she received poor care there.  Copied from CRM [id number]. Topic: Clinical - Red Word Triage >> Apr 12, 2023  2:54 PM Tami Jones wrote: Red Word that prompted transfer to Nurse Triage: Patient cannot pick anything up with left arm, she can move it but can not hold anything in her hand, fingers are numb. Reason for Disposition  [1] Weakness (i.e., paralysis, loss of muscle strength) of the face, arm / hand, or leg / foot on one side of the body AND [2] sudden onset AND [3] present now  (Exception: Bell's palsy suspected [i.e., weakness only on one side of the face, developing over hours to days, no other symptoms].)  Answer Assessment - Initial Assessment Questions 1. SYMPTOM: "What is the main symptom you are concerned about?" (e.g., weakness, numbness)     Weakness in the left arm with  numbness in fingers. She states she can hold anything in her left hand. Patient states she has felt off balance "wobbly the last few days".  2. ONSET: "When did this start?" (minutes, hours, days; while sleeping)     Just a few minutes ago.  3. LAST NORMAL: "When was the last time you (the patient) were normal (no symptoms)?"     End of last week.  4. PATTERN "Does this come and go, or has it been constant since it started?"  "Is it present now?"     Constant and present now.   5. CARDIAC SYMPTOMS: "Have you had any of the following symptoms: chest pain, difficulty breathing, palpitations?"     Patient states sometimes she has pain in her chest and it goes away, she states no pain today.   6. NEUROLOGIC SYMPTOMS: "Have you had any of the following symptoms: headache, dizziness, vision loss, double vision, changes in speech, unsteady on your feet?"     Off balance.  7. OTHER SYMPTOMS: "Do you have any other symptoms?"     Neck and back of head pain.  Protocols used: Neurologic Deficit-A-AH

## 2023-04-13 ENCOUNTER — Telehealth (HOSPITAL_COMMUNITY): Payer: Self-pay | Admitting: Pharmacy Technician

## 2023-04-13 ENCOUNTER — Emergency Department (HOSPITAL_COMMUNITY): Payer: Medicare Other

## 2023-04-13 ENCOUNTER — Inpatient Hospital Stay (HOSPITAL_COMMUNITY): Payer: Medicare Other

## 2023-04-13 ENCOUNTER — Other Ambulatory Visit (HOSPITAL_COMMUNITY): Payer: Self-pay

## 2023-04-13 DIAGNOSIS — R29810 Facial weakness: Secondary | ICD-10-CM | POA: Diagnosis present

## 2023-04-13 DIAGNOSIS — N179 Acute kidney failure, unspecified: Secondary | ICD-10-CM

## 2023-04-13 DIAGNOSIS — Z8249 Family history of ischemic heart disease and other diseases of the circulatory system: Secondary | ICD-10-CM | POA: Diagnosis not present

## 2023-04-13 DIAGNOSIS — J45909 Unspecified asthma, uncomplicated: Secondary | ICD-10-CM | POA: Diagnosis present

## 2023-04-13 DIAGNOSIS — R29703 NIHSS score 3: Secondary | ICD-10-CM | POA: Diagnosis present

## 2023-04-13 DIAGNOSIS — E1122 Type 2 diabetes mellitus with diabetic chronic kidney disease: Secondary | ICD-10-CM | POA: Diagnosis present

## 2023-04-13 DIAGNOSIS — M50222 Other cervical disc displacement at C5-C6 level: Secondary | ICD-10-CM | POA: Diagnosis not present

## 2023-04-13 DIAGNOSIS — E785 Hyperlipidemia, unspecified: Secondary | ICD-10-CM | POA: Diagnosis not present

## 2023-04-13 DIAGNOSIS — Z833 Family history of diabetes mellitus: Secondary | ICD-10-CM | POA: Diagnosis not present

## 2023-04-13 DIAGNOSIS — I63511 Cerebral infarction due to unspecified occlusion or stenosis of right middle cerebral artery: Secondary | ICD-10-CM

## 2023-04-13 DIAGNOSIS — R2689 Other abnormalities of gait and mobility: Secondary | ICD-10-CM | POA: Diagnosis present

## 2023-04-13 DIAGNOSIS — G8194 Hemiplegia, unspecified affecting left nondominant side: Secondary | ICD-10-CM | POA: Diagnosis present

## 2023-04-13 DIAGNOSIS — I63531 Cerebral infarction due to unspecified occlusion or stenosis of right posterior cerebral artery: Secondary | ICD-10-CM | POA: Diagnosis not present

## 2023-04-13 DIAGNOSIS — R29898 Other symptoms and signs involving the musculoskeletal system: Secondary | ICD-10-CM | POA: Diagnosis not present

## 2023-04-13 DIAGNOSIS — N1831 Chronic kidney disease, stage 3a: Secondary | ICD-10-CM | POA: Diagnosis not present

## 2023-04-13 DIAGNOSIS — K219 Gastro-esophageal reflux disease without esophagitis: Secondary | ICD-10-CM | POA: Diagnosis present

## 2023-04-13 DIAGNOSIS — Z7984 Long term (current) use of oral hypoglycemic drugs: Secondary | ICD-10-CM | POA: Diagnosis not present

## 2023-04-13 DIAGNOSIS — I6389 Other cerebral infarction: Secondary | ICD-10-CM | POA: Diagnosis not present

## 2023-04-13 DIAGNOSIS — F419 Anxiety disorder, unspecified: Secondary | ICD-10-CM | POA: Diagnosis present

## 2023-04-13 DIAGNOSIS — I639 Cerebral infarction, unspecified: Secondary | ICD-10-CM | POA: Diagnosis not present

## 2023-04-13 DIAGNOSIS — R4702 Dysphasia: Secondary | ICD-10-CM | POA: Diagnosis present

## 2023-04-13 DIAGNOSIS — I63411 Cerebral infarction due to embolism of right middle cerebral artery: Secondary | ICD-10-CM | POA: Diagnosis not present

## 2023-04-13 DIAGNOSIS — M50223 Other cervical disc displacement at C6-C7 level: Secondary | ICD-10-CM | POA: Diagnosis not present

## 2023-04-13 DIAGNOSIS — Z79899 Other long term (current) drug therapy: Secondary | ICD-10-CM | POA: Diagnosis not present

## 2023-04-13 DIAGNOSIS — I69351 Hemiplegia and hemiparesis following cerebral infarction affecting right dominant side: Secondary | ICD-10-CM | POA: Diagnosis not present

## 2023-04-13 DIAGNOSIS — I6523 Occlusion and stenosis of bilateral carotid arteries: Secondary | ICD-10-CM | POA: Diagnosis not present

## 2023-04-13 DIAGNOSIS — F32A Depression, unspecified: Secondary | ICD-10-CM | POA: Diagnosis present

## 2023-04-13 DIAGNOSIS — M4802 Spinal stenosis, cervical region: Secondary | ICD-10-CM | POA: Diagnosis not present

## 2023-04-13 DIAGNOSIS — I129 Hypertensive chronic kidney disease with stage 1 through stage 4 chronic kidney disease, or unspecified chronic kidney disease: Secondary | ICD-10-CM | POA: Diagnosis present

## 2023-04-13 DIAGNOSIS — E669 Obesity, unspecified: Secondary | ICD-10-CM | POA: Diagnosis present

## 2023-04-13 DIAGNOSIS — I160 Hypertensive urgency: Secondary | ICD-10-CM | POA: Diagnosis present

## 2023-04-13 DIAGNOSIS — I63521 Cerebral infarction due to unspecified occlusion or stenosis of right anterior cerebral artery: Secondary | ICD-10-CM | POA: Diagnosis not present

## 2023-04-13 DIAGNOSIS — E860 Dehydration: Secondary | ICD-10-CM | POA: Diagnosis present

## 2023-04-13 DIAGNOSIS — E1140 Type 2 diabetes mellitus with diabetic neuropathy, unspecified: Secondary | ICD-10-CM | POA: Diagnosis present

## 2023-04-13 DIAGNOSIS — M47812 Spondylosis without myelopathy or radiculopathy, cervical region: Secondary | ICD-10-CM | POA: Diagnosis not present

## 2023-04-13 DIAGNOSIS — R002 Palpitations: Secondary | ICD-10-CM | POA: Diagnosis not present

## 2023-04-13 DIAGNOSIS — I6782 Cerebral ischemia: Secondary | ICD-10-CM | POA: Diagnosis not present

## 2023-04-13 LAB — GLUCOSE, CAPILLARY: Glucose-Capillary: 177 mg/dL — ABNORMAL HIGH (ref 70–99)

## 2023-04-13 LAB — URINALYSIS, ROUTINE W REFLEX MICROSCOPIC
Bilirubin Urine: NEGATIVE
Glucose, UA: NEGATIVE mg/dL
Hgb urine dipstick: NEGATIVE
Ketones, ur: NEGATIVE mg/dL
Nitrite: NEGATIVE
Protein, ur: NEGATIVE mg/dL
Specific Gravity, Urine: 1.042 — ABNORMAL HIGH (ref 1.005–1.030)
pH: 5 (ref 5.0–8.0)

## 2023-04-13 LAB — ECHOCARDIOGRAM COMPLETE BUBBLE STUDY
AR max vel: 2.15 cm2
AV Area VTI: 2.1 cm2
AV Area mean vel: 1.78 cm2
AV Mean grad: 2 mm[Hg]
AV Peak grad: 4.8 mm[Hg]
Ao pk vel: 1.09 m/s
Area-P 1/2: 3.12 cm2
S' Lateral: 3 cm

## 2023-04-13 LAB — RAPID URINE DRUG SCREEN, HOSP PERFORMED
Amphetamines: NOT DETECTED
Barbiturates: NOT DETECTED
Benzodiazepines: NOT DETECTED
Cocaine: NOT DETECTED
Opiates: NOT DETECTED
Tetrahydrocannabinol: NOT DETECTED

## 2023-04-13 LAB — HIV ANTIBODY (ROUTINE TESTING W REFLEX): HIV Screen 4th Generation wRfx: NONREACTIVE

## 2023-04-13 MED ORDER — LORAZEPAM 2 MG/ML IJ SOLN: 1.0000 mg | INTRAMUSCULAR | Status: DC | PRN

## 2023-04-13 MED ORDER — ASPIRIN 81 MG PO TBEC
81.0000 mg | DELAYED_RELEASE_TABLET | Freq: Every evening | ORAL | Status: DC
  Administered 2023-04-13 – 2023-04-14 (×2): 81 mg via ORAL
  Filled 2023-04-13 (×2): qty 1

## 2023-04-13 MED ORDER — METOPROLOL SUCCINATE ER 25 MG PO TB24
25.0000 mg | ORAL_TABLET | Freq: Every day | ORAL | Status: DC
  Administered 2023-04-14 – 2023-04-15 (×2): 25 mg via ORAL
  Filled 2023-04-13 (×2): qty 1

## 2023-04-13 MED ORDER — ALPRAZOLAM 0.25 MG PO TABS: 0.1250 mg | ORAL_TABLET | Freq: Every evening | ORAL | Status: DC | PRN

## 2023-04-13 MED ORDER — AMLODIPINE BESYLATE 5 MG PO TABS
10.0000 mg | ORAL_TABLET | Freq: Every evening | ORAL | Status: DC
  Administered 2023-04-14: 10 mg via ORAL
  Filled 2023-04-13: qty 2

## 2023-04-13 MED ORDER — PERFLUTREN LIPID MICROSPHERE
1.0000 mL | INTRAVENOUS | Status: AC | PRN
  Administered 2023-04-13: 3 mL via INTRAVENOUS

## 2023-04-13 MED ORDER — PANTOPRAZOLE SODIUM 40 MG PO TBEC
40.0000 mg | DELAYED_RELEASE_TABLET | Freq: Every day | ORAL | Status: DC
  Administered 2023-04-13 – 2023-04-15 (×3): 40 mg via ORAL
  Filled 2023-04-13 (×3): qty 1

## 2023-04-13 MED ORDER — ACETAMINOPHEN 160 MG/5ML PO SOLN
650.0000 mg | ORAL | Status: DC | PRN
  Administered 2023-04-14: 325 mg

## 2023-04-13 MED ORDER — STROKE: EARLY STAGES OF RECOVERY BOOK
Freq: Once | Status: AC
  Filled 2023-04-13: qty 1

## 2023-04-13 MED ORDER — ENOXAPARIN SODIUM 40 MG/0.4ML IJ SOSY
40.0000 mg | PREFILLED_SYRINGE | INTRAMUSCULAR | Status: DC
  Administered 2023-04-13 – 2023-04-15 (×3): 40 mg via SUBCUTANEOUS
  Filled 2023-04-13 (×3): qty 0.4

## 2023-04-13 MED ORDER — INSULIN ASPART 100 UNIT/ML IJ SOLN
0.0000 [IU] | Freq: Three times a day (TID) | INTRAMUSCULAR | Status: DC
  Administered 2023-04-14 – 2023-04-15 (×2): 2 [IU] via SUBCUTANEOUS

## 2023-04-13 MED ORDER — ACETAMINOPHEN 325 MG PO TABS
650.0000 mg | ORAL_TABLET | ORAL | Status: DC | PRN
  Administered 2023-04-15: 650 mg via ORAL
  Filled 2023-04-13 (×2): qty 2

## 2023-04-13 MED ORDER — ATORVASTATIN CALCIUM 80 MG PO TABS
80.0000 mg | ORAL_TABLET | Freq: Every evening | ORAL | Status: DC
  Administered 2023-04-13 – 2023-04-14 (×2): 80 mg via ORAL
  Filled 2023-04-13: qty 2
  Filled 2023-04-13: qty 1

## 2023-04-13 MED ORDER — CLOPIDOGREL BISULFATE 75 MG PO TABS
75.0000 mg | ORAL_TABLET | Freq: Every day | ORAL | Status: DC
Start: 2023-04-13 — End: 2023-04-15
  Administered 2023-04-13 – 2023-04-15 (×3): 75 mg via ORAL
  Filled 2023-04-13 (×3): qty 1

## 2023-04-13 MED ORDER — GLIPIZIDE ER 5 MG PO TB24
5.0000 mg | ORAL_TABLET | Freq: Two times a day (BID) | ORAL | Status: DC
  Administered 2023-04-13 – 2023-04-15 (×4): 5 mg via ORAL
  Filled 2023-04-13 (×5): qty 1

## 2023-04-13 MED ORDER — ACETAMINOPHEN 650 MG RE SUPP: 650.0000 mg | RECTAL | Status: DC | PRN

## 2023-04-13 MED ORDER — SENNOSIDES-DOCUSATE SODIUM 8.6-50 MG PO TABS
1.0000 | ORAL_TABLET | Freq: Every evening | ORAL | Status: DC | PRN
  Administered 2023-04-14: 1 via ORAL
  Filled 2023-04-13: qty 1

## 2023-04-13 NOTE — ED Notes (Signed)
 Patient in MRI

## 2023-04-13 NOTE — Consult Note (Signed)
 NEUROLOGY CONSULT NOTE   Date of service: April 13, 2023 Patient Name: Tami Jones MRN:  [id number] DOB:  [birth year] Chief Complaint: "left-sided weakness" Requesting Provider: Steffanie Rainwater, MD  History of Present Illness  Hollyann Pablo Dauphinais is a 68 y.o. female with hx of remote CVAs right occipital and bilateral PCA infarcts with residual right-sided weakness (arm and leg), type 2 diabetes mellitus, essential hypertension, hyperlipidemia, anxiety, depression, and GERD who presented to the ED 2/25 for evaluation of left-sided weakness.  Patient states that she has had fluctuating left facial drawing for approximately one week that resolves with rest.  On Monday morning 2/24, the patient noticed that she was wobbly while walking, dragging her left leg, and running into walls.  She then noticed that her left forearm had an "asleep" sensation with associated left hand weakness.  With persistent weakness, she decided to be evaluated in the ER on 04/12/23.  MRI imaging revealed patchy acute right MCA territory infarcts as well as punctate acute right occipital infarcts and patient was admitted for further evaluation.  Patient states that she has been on ASA and clopidogrel since her most recent stroke in 2020 and is compliant with her mediation.  Of note, the patient states that she had 2 falls within the last week where she struck her head with once mechanical fall striking her head on the floor and one fall into bed striking her head on the wall behind her bed. She also endorses a 30 pound unintentional weight loss over the last few months without reports of recent illness or rash.   On review of office note weights:  Wt 106.6 kg 05/24/2022  Wt 105.2 kg 12/09/2022;  Wt 99.8 kg 03/24/2023;  weight is noted to be 96.6 kg on this encounter     LKW: Unclear. Patient reports one week of fluctuating left facial drawing and waking up on 2/24 with further left upper and lower extremity weakness.   Modified rankin score: 3-Moderate disability-requires help but walks WITHOUT assistance IV Thrombolysis: No, patient presented outside of the thrombolytic therapy time window EVT: No, no LVO  NIHSS components Score: Comment  1a Level of Conscious 0[x]  1[]  2[]  3[]      1b LOC Questions 0[x]  1[]  2[]       1c LOC Commands 0[x]  1[]  2[]       2 Best Gaze 0[x]  1[]  2[]       3 Visual 0[]  1[x]  2[]  3[]      4 Facial Palsy 0[x]  1[]  2[]  3[]      5a Motor Arm - left 0[]  1[x]  2[]  3[]  4[]  UN[]    5b Motor Arm - Right 0[]  1[x]  2[]  3[]  4[]  UN[]    6a Motor Leg - Left 0[]  1[]  2[x]  3[]  4[]  UN[]    6b Motor Leg - Right 0[]  1[]  2[x]  3[]  4[]  UN[]    7 Limb Ataxia 0[x]  1[]  2[]  3[]  UN[]     8 Sensory 0[]  1[x]  2[]  UN[]      9 Best Language 0[x]  1[]  2[]  3[]      10 Dysarthria 0[]  1[x]  2[]  UN[]      11 Extinct. and Inattention 0[x]  1[]  2[]       TOTAL:  9    ROS  Comprehensive ROS performed and pertinent positives documented in HPI   Past History   Past Medical History:  Diagnosis Date   Anxiety    Asthma    CVA (cerebral vascular accident) (HCC) 03/2006   right occipital, Dr. Thad Ranger   Diabetes mellitus    Expressive dysphasia  03/25/2022   GERD (gastroesophageal reflux disease)    Hyperlipidemia    Hypertension    Hypertensive urgency 03/25/2022   Past Surgical History:  Procedure Laterality Date   CARDIOVASCULAR STRESS TEST  1/15   myoview. EF 60%   CESAREAN SECTION     ESOPHAGOGASTRODUODENOSCOPY  05/2005   TONSILLECTOMY AND ADENOIDECTOMY     Family History: Family History  Problem Relation Age of Onset   Coronary artery disease Mother    Diabetes Mellitus II Mother    Heart attack Mother 3   Diabetes Mellitus II Maternal Grandmother    Social History  reports that she has never smoked. She has never been exposed to tobacco smoke. She has never used smokeless tobacco. She reports that she does not drink alcohol and does not use drugs.  Allergies  Allergen Reactions   Citalopram Other (See  Comments)    Feels odd   Doxycycline Hyclate Nausea And Vomiting   Erythromycin Other (See Comments)    Irritated stomach   Montelukast Sodium Itching   Nitrofurantoin Nausea And Vomiting   Sulfa Antibiotics Nausea And Vomiting   Tramadol Hcl Other (See Comments)    Feels odd   Venlafaxine Hcl Other (See Comments)    Feels odd   Amoxicillin-Pot Clavulanate Nausea And Vomiting   Clarithromycin Rash     See ER note 02/2017   Medications   Current Facility-Administered Medications:    [START ON 04/14/2023]  stroke: early stages of recovery book, , Does not apply, Once, Steffanie Rainwater, MD   acetaminophen (TYLENOL) tablet 650 mg, 650 mg, Oral, Q4H PRN **OR** acetaminophen (TYLENOL) 160 MG/5ML solution 650 mg, 650 mg, Per Tube, Q4H PRN **OR** acetaminophen (TYLENOL) suppository 650 mg, 650 mg, Rectal, Q4H PRN, Amponsah, Flossie Buffy, MD   enoxaparin (LOVENOX) injection 40 mg, 40 mg, Subcutaneous, Q24H, Amponsah, Flossie Buffy, MD   LORazepam (ATIVAN) injection 1 mg, 1 mg, Intravenous, Q15 min PRN, Amponsah, Flossie Buffy, MD   senna-docusate (Senokot-S) tablet 1 tablet, 1 tablet, Oral, QHS PRN, Steffanie Rainwater, MD  Current Outpatient Medications:    pantoprazole (PROTONIX) 40 MG tablet, Take 40 mg by mouth daily., Disp: , Rfl:    acetaminophen (TYLENOL) 325 MG tablet, Take 650 mg by mouth every 6 (six) hours as needed., Disp: , Rfl:    ALPRAZolam (XANAX) 0.25 MG tablet, Take 1 tablet (0.25 mg total) by mouth 3 (three) times daily as needed for anxiety or sleep., Disp: 30 tablet, Rfl: 0   amLODipine (NORVASC) 10 MG tablet, Take 1 tablet (10 mg total) by mouth daily., Disp: 1 tablet, Rfl: 3   aspirin EC 81 MG tablet, Take 81 mg by mouth every evening. , Disp: , Rfl:    atorvastatin (LIPITOR) 80 MG tablet, TAKE 1 TABLET BY MOUTH EVERY EVENING, Disp: 90 tablet, Rfl: 3   clopidogrel (PLAVIX) 75 MG tablet, TAKE 1 TABLET BY MOUTH EVERY DAY, Disp: 90 tablet, Rfl: 3   glipiZIDE (GLUCOTROL XL) 5 MG 24  hr tablet, TAKE 1 TABLET (5 MG TOTAL) BY MOUTH IN THE MORNING AND AT BEDTIME, Disp: 180 tablet, Rfl: 3   glucose blood test strip, Use daily as instructed, Disp: 100 each, Rfl: 3   ketoconazole (NIZORAL) 2 % cream, Apply 1 Application topically 2 (two) times daily as needed for irritation., Disp: 60 g, Rfl: 1   lidocaine (XYLOCAINE) 5 % ointment, Apply 1 Application topically as needed., Disp: 35.44 g, Rfl: 0   lisinopril (ZESTRIL) 10 MG tablet, Take  1 tablet (10 mg total) by mouth daily., Disp: 90 tablet, Rfl: 3   metoprolol succinate (TOPROL-XL) 25 MG 24 hr tablet, Take 1 tablet (25 mg total) by mouth daily., Disp: 90 tablet, Rfl: 3  Vitals   Vitals:   04/13/23 0600 04/13/23 0615 04/13/23 0845 04/13/23 0847  BP: (!) 213/96 (!) 209/87 (!) 206/87   Pulse: 73 67 69   Resp: 16 15 17    Temp:    98.3 F (36.8 C)  TempSrc:   Oral Oral  SpO2: 100% 97% 100%   Weight:      Height:        Body mass index is 37.73 kg/m.  Physical Exam   Constitutional: Appears well-developed and well-nourished. Obese Caucasian female in no acute distress.  Psych: Patient intermittently tearful during exam but is cooperative throughout; also has an anxious affect that appears to limit exam at times Eyes: No scleral injection.  HENT: No OP obstruction.  Head: Normocephalic and atraumatic  Cardiovascular: Normal rate and regular rhythm on telemetry Respiratory: Effort normal, non-labored breathing on room air GI: Soft.  No distension. There is no tenderness.  Skin: WDI.   Neurologic Examination   Mental Status: Patient is awake, alert, oriented to person, place, month, year, and situation. Patient is able to give a clear and coherent history with some inconsistent details throughout exam.  No signs of aphasia or neglect -- this is also inconsistent (couldn't name frame with MD but did name frame for NP, for example).  Mild dysarthria (baseline) She follows simple commands without difficulty but often  requires coaching for full effort.  Attention / concentration appears poor Cranial Nerves: II: Visual field exam limited by inconsistent patient reporting and significant peripheral vision loss and dense right eye cataract. There is inconsistent nystagmus with very saccadic pursuits. Possibly some oculomotor apraxia vs. poor attention/concentration V: Facial sensation is intact and symmetric to light touch VII: Mild right facial droop VIII: Hearing is intact to voice X: Palate elevates symmetrically XI: Shoulder shrug is symmetric. XII: Tongue protrudes midline  Motor: Strength exam is felt to be partially limited by patient effort with some improvement with coaching.  Right hemiplegia is chronic. RUE elevates antigravity with vertical drift 4-/5 throughout with 4/5 grip strength.  Right lower extremity elevates antigravity with consistent wobbling and vertical drift.  Left lower extremity elevates minimally antigravity with vertical drift.  Left-side is slightly weaker than the right upper and lower extremity.  LUE drift is slightly more pronounced than the right with 3/5 grip strength on the left.  Sensory: Right upper extremity with some residual "asleep" sensation from previous stroke. Left forearm with a slightly more pronounced "asleep" sensation as compared to the right.  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  Deep Tendon Reflexes: 2+ and symmetric in the biceps and 3+ patellae.  Plantars: Toes are downgoing bilaterally.  Cerebellar: No overt ataxia noted.   Labs/Imaging/Neurodiagnostic studies  CBC:  Recent Labs  Lab 04/12/23 1731 04/12/23 1828  WBC 7.2  --   NEUTROABS 4.3  --   HGB 13.9 15.6*  HCT 41.4 46.0  MCV 93.5  --   PLT 236  --    Basic Metabolic Panel:  Lab Results  Component Value Date   NA 142 04/12/2023   K 3.6 04/12/2023   CO2 22 04/12/2023   GLUCOSE 101 (H) 04/12/2023   BUN 22 04/12/2023   CREATININE 1.50 (H)  04/12/2023   CALCIUM 9.0 04/12/2023  GFRNONAA 41 (L) 04/12/2023   GFRAA 69 11/27/2019   Lipid Panel:  Lab Results  Component Value Date   CHOL 211 (H) 11/25/2022   HDL 39 (L) 11/25/2022   LDLCALC 148 (H) 11/25/2022   TRIG 121 11/25/2022   CHOLHDL 5.4 11/25/2022    HgbA1c:  Lab Results  Component Value Date   HGBA1C 6.8 (A) 03/24/2023   Urine Drug Screen:     Component Value Date/Time   LABOPIA NONE DETECTED 03/25/2022 0033   COCAINSCRNUR NONE DETECTED 03/25/2022 0033   LABBENZ NONE DETECTED 03/25/2022 0033   AMPHETMU NONE DETECTED 03/25/2022 0033   THCU NONE DETECTED 03/25/2022 0033   LABBARB NONE DETECTED 03/25/2022 0033    Alcohol Level     Component Value Date/Time   ETH <10 04/12/2023 1731   INR  Lab Results  Component Value Date   INR 1.0 04/12/2023   APTT  Lab Results  Component Value Date   APTT 26 04/12/2023   AED levels: No results found for: "PHENYTOIN", "ZONISAMIDE", "LAMOTRIGINE", "LEVETIRACETA"  CT Head without contrast(Personally reviewed): 1. No acute intracranial abnormality. 2. Chronic bilateral PCA distribution infarcts. 3. Underlying chronic microvascular ischemic disease with a few remote lacunar infarcts about the bilateral basal ganglia/corona radiata.  CT angio Head and Neck with contrast(Personally reviewed): 1. Negative CTA for large vessel occlusion or other emergent finding. 2. Diffuse intracranial atherosclerotic disease as detailed above, overall similar as compared to prior CTA from 03/26/2022. 3. 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 Irregular intermittent opacification within the neck, but patent at the skull base, likely from collateral flow. Right vertebral artery functionally terminates in PICA. Moderate multifocal stenoses about the contralateral dominant left V4 segment. 4.  Aortic Atherosclerosis (ICD10-I70.0). To me, appears to have a significant right M1 stenosis as well  MRI Brain(Personally  reviewed): 1. Patchy acute right frontoparietal infarcts (MCA territory). 2. Punctate acute right occipital infarcts. 3. Moderate chronic small vessel ischemic disease. Chronic bilateral PCA infarcts.  ECHO Pending  ASSESSMENT   Leasia Swann Moorer is a 68 y.o. female with a PMHx of HTN, HLD, DM2, remote CVAs with residual right-sided weakness on ASA and clopidogrel (does not follow up with neurology outpatient), anxiety, depression, GERD, morbid obesity with a BMI of 37.73 kg/m who presents to the ED 2/25 for evaluation of 1 week of fluctuating left facial drawing and left upper and lower extremity weakness noted when she woke from sleep on 04/11/23.  MRI imaging on arrival significant for patchy acute right MCA territory infarcts with punctate acute right occipital infarcts. CTA head and neck reveals diffuse intracranial atherosclerotic disease.   Impression: Patchy acute right MCA territory infarcts, punctate acute right occipital infarcts (likely right PCA territory)  RECOMMENDATIONS  # Strokes in multiple vascular territories; need to consider central embolic source though potentially secondary to severe iCAD  - HgbA1c 6.8% on 03/24/23, fasting lipid panel pending, continue home statin - Frequent neuro checks - Echocardiogram ordered - Prophylactic therapy- Antiplatelet med: continue aspirin - dose 81 mg PO and clopidogrel 75 mg PO daily for now pending further stroke team recommendations  - Risk factor modification - Telemetry monitoring - PT consult, OT consult - Stroke team to follow  # Weight loss - ~20 lbs (9 kg) on objective documentation - may be attributable to diet/exercise efforts - further workup of patient's reported anoxeria per primary team / PCP   # Cost of care If change in antiplatelet agent considered, Ticagrelor copay: first fill would be $  302.00 due to a $255.00 deductible. Following fills will be $47.00 once deductible is met.   ________________________________________________________________  Signed, Kara Mead, NP [facility]  Attending Neurologist's note:  I personally saw this patient, gathering history, performing a full neurologic examination, reviewing relevant labs, personally reviewing relevant imaging including MRI brain, CTA head and neck, and formulated the assessment and plan, adding the note above for completeness and clarity to accurately reflect my thoughts  Brooke Dare MD-PhD [facility] [phone] Available 7 AM to 7 PM, outside these hours please contact Neurologist on call listed on AMION

## 2023-04-13 NOTE — ED Provider Notes (Signed)
 [place] EMERGENCY DEPARTMENT AT [facility] Provider Note   CSN: [id number] Arrival date & time: 04/12/23  1706     History {Add pertinent medical, surgical, social history, OB history to HPI:1} Chief Complaint  Patient presents with   Extremity Weakness    Tami Jones is a 68 y.o. female.  68 year old female with history of stroke, hypertension who presents ER today with left-sided weakness.  Patient has residual weakness on the right side from previous stroke.  She states that yesterday at some point she was having some left hand paresthesias.  This progressively worsened in the grip strength weakness and then started having left leg weakness as well to the point where she could not walk with her cane that she does not baseline.  She presented ER for further evaluation.  No other symptoms.  No vision changes.  No trouble swallowing.  No noted facial droop per family member in the room.   Extremity Weakness       Home Medications Prior to Admission medications   Medication Sig Start Date End Date Taking? Authorizing Provider  acetaminophen (TYLENOL) 325 MG tablet Take 650 mg by mouth every 6 (six) hours as needed.    [provider]  ALPRAZolam Prudy Feeler) 0.25 MG tablet Take 1 tablet (0.25 mg total) by mouth 3 (three) times daily as needed for anxiety or sleep. 12/24/22   Karie Schwalbe, MD  amLODipine (NORVASC) 10 MG tablet Take 1 tablet (10 mg total) by mouth daily. 03/24/23   Karie Schwalbe, MD  aspirin EC 81 MG tablet Take 81 mg by mouth every evening.     [provider]  atorvastatin (LIPITOR) 80 MG tablet TAKE 1 TABLET BY MOUTH EVERY EVENING 06/11/22   Tillman Abide I, MD  clopidogrel (PLAVIX) 75 MG tablet TAKE 1 TABLET BY MOUTH EVERY DAY 03/22/22   Tillman Abide I, MD  glipiZIDE (GLUCOTROL XL) 5 MG 24 hr tablet TAKE 1 TABLET (5 MG TOTAL) BY MOUTH IN THE MORNING AND AT BEDTIME 01/21/23   Tillman Abide I, MD  glucose blood test strip Use  daily as instructed 06/12/22   Karie Schwalbe, MD  ketoconazole (NIZORAL) 2 % cream Apply 1 Application topically 2 (two) times daily as needed for irritation. 12/09/22   Karie Schwalbe, MD  lidocaine (XYLOCAINE) 5 % ointment Apply 1 Application topically as needed. 10/03/22   Sharyn Creamer, MD  lisinopril (ZESTRIL) 10 MG tablet Take 1 tablet (10 mg total) by mouth daily. 01/06/23   Karie Schwalbe, MD  metoprolol succinate (TOPROL-XL) 25 MG 24 hr tablet Take 1 tablet (25 mg total) by mouth daily. 03/18/23   Karie Schwalbe, MD      Allergies    Citalopram, Doxycycline hyclate, Erythromycin, Montelukast sodium, Nitrofurantoin, Sulfa antibiotics, Tramadol hcl, Venlafaxine hcl, Amoxicillin-pot clavulanate, and Clarithromycin    Review of Systems   Review of Systems  Musculoskeletal:  Positive for extremity weakness.    Physical Exam Updated Vital Signs BP (!) 230/92   Pulse 71   Temp 98 F (36.7 C)   Resp 20   Ht 5\' 3"  (1.6 m)   Wt 96.6 kg   SpO2 90%   BMI 37.73 kg/m  Physical Exam Vitals and nursing note reviewed.  Constitutional:      Appearance: She is well-developed.  HENT:     Head: Normocephalic and atraumatic.  Cardiovascular:     Rate and Rhythm: Normal rate and regular rhythm.  Pulmonary:  Effort: No respiratory distress.     Breath sounds: No stridor.  Abdominal:     General: There is no distension.  Musculoskeletal:     Cervical back: Normal range of motion.     Comments: Found with left grip strength weakness and also elbow flexion/extension weakness compared to what I expect her baseline to be although it is hard to compare to the right side as she has residual deficits.  Face is symmetric, tongue protrudes midline.  Extraocular movements intact.  Patellar bicep reflexes intact bilaterally, left dorsiflexion/plantarflexion weakness.  Neurological:     Mental Status: She is alert.     ED Results / Procedures / Treatments   Labs (all labs ordered are  listed, but only abnormal results are displayed) Labs Reviewed  COMPREHENSIVE METABOLIC PANEL - Abnormal; Notable for the following components:      Result Value   Glucose, Bld 108 (*)    Creatinine, Ser 1.42 (*)    Total Protein 6.4 (*)    GFR, Estimated 41 (*)    All other components within normal limits  I-STAT CHEM 8, ED - Abnormal; Notable for the following components:   Creatinine, Ser 1.50 (*)    Glucose, Bld 101 (*)    Calcium, Ion 1.13 (*)    Hemoglobin 15.6 (*)    All other components within normal limits  ETHANOL  PROTIME-INR  APTT  CBC  DIFFERENTIAL  RAPID URINE DRUG SCREEN, HOSP PERFORMED  URINALYSIS, ROUTINE W REFLEX MICROSCOPIC    EKG None  Radiology CT Head Wo Contrast Result Date: 04/12/2023 CLINICAL DATA:  Initial evaluation for neuro deficit, stroke suspected. EXAM: CT ANGIOGRAPHY HEAD AND NECK WITH AND WITHOUT CONTRAST TECHNIQUE: Multidetector CT imaging of the head and neck was performed using the standard protocol during bolus administration of intravenous contrast. Multiplanar CT image reconstructions and MIPs were obtained to evaluate the vascular anatomy. Carotid stenosis measurements (when applicable) are obtained utilizing NASCET criteria, using the distal internal carotid diameter as the denominator. RADIATION DOSE REDUCTION: This exam was performed according to the departmental dose-optimization program which includes automated exposure control, adjustment of the mA and/or kV according to patient size and/or use of iterative reconstruction technique. CONTRAST:  75mL OMNIPAQUE IOHEXOL 350 MG/ML SOLN COMPARISON:  Prior study from 11/24/2022 FINDINGS: CT HEAD FINDINGS Brain: Cerebral volume within normal limits. Sequelae of chronic microvascular ischemic disease with a few small remote lacunar infarcts about the corona radiata/basal ganglia. Chronic bilateral PCA distribution infarcts noted. No acute intracranial hemorrhage. No visible acute cortically based  infarct. No mass lesion or midline shift. No hydrocephalus or extra-axial fluid collection. Vascular: No abnormal hyperdense vessel. Skull: Scalp soft tissues within normal limits.  Calvarium intact. Sinuses/Orbits: Globes and orbital soft tissues within normal limits. Paranasal sinuses and mastoid air cells are largely clear. Other: None. Review of the MIP images confirms the above findings CTA NECK FINDINGS Aortic arch: Mild aortic atherosclerosis. Standard branching. Imaged portion shows no evidence of aneurysm or dissection. No significant stenosis of the major arch vessel origins. Right carotid system: Right common and internal carotid arteries are patent without dissection. Mild atheromatous change about the right carotid bulb without hemodynamically significant greater than 50% stenosis. Left carotid system: Left common and internal carotid arteries are patent without dissection. Mild-to-moderate atheromatous change about the left carotid bulb without hemodynamically significant greater than 50% stenosis. Vertebral arteries: Both vertebral arteries arise from the subclavian arteries. Left vertebral artery strongly dominant and patent without stenosis or dissection. Right vertebral  artery diffusely hypoplastic and appears occluded at its origin. Irregular attenuated intermittent opacification through the right V2 segment. Right vertebral artery is patent at the right V3 segment with subsequent wide patency as it courses into the cranial vault. Appearance is relatively similar to prior. Skeleton: No discrete or worrisome osseous lesions. Mild to moderate cervical spondylosis, most pronounced at C5-6. Scattered dental caries noted. Other neck: No other acute finding. Upper chest: No other acute finding. Review of the MIP images confirms the above findings CTA HEAD FINDINGS Anterior circulation: Calcified atherosclerosis present about the carotid siphons bilaterally with estimated 50-60% stenosis bilaterally,  similar to prior. A1 segments patent bilaterally, with the left being dominant. Normal anterior communicating artery complex. Atheromatous irregularity throughout the ACAs without high-grade stenosis. Atheromatous irregularity about the M1 segments without high-grade stenosis. No proximal MCA branch occlusion. Distal small vessel atheromatous irregularity throughout the MCA branches bilaterally. Appearance is relatively similar. Posterior circulation: Hypoplastic right vertebral artery largely terminates in PICA. Distal right V4 segment minimally opacified with subsequent occlusion prior to the vertebrobasilar junction. Atherosclerotic change about the dominant left V4 segment with moderate multifocal stenoses, similar. Both PICA remain patent. Basilar is somewhat diminutive and diffusely irregular without focal high-grade stenosis. Superior cerebral arteries patent bilaterally. Atherosclerotic change about both PCAs which are irregular and attenuated in appearance. Moderate to severe stenoses noted at the left P2 segment and proximal right P1/P2 segments. PCAs remain patent to their distal aspects. Appearance is similar. Venous sinuses: Patent allowing for timing the contrast bolus. Anatomic variants: As above.  No aneurysm. Review of the MIP images confirms the above findings IMPRESSION: CT HEAD: 1. No acute intracranial abnormality. 2. Chronic bilateral PCA distribution infarcts. 3. Underlying chronic microvascular ischemic disease with a few remote lacunar infarcts about the bilateral basal ganglia/corona radiata. CTA HEAD AND NECK: 1. Negative CTA for large vessel occlusion or other emergent finding. 2. Diffuse intracranial atherosclerotic disease as detailed above, overall similar as compared to prior CTA from 03/26/2022. 3. 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 Irregular intermittent opacification within the neck, but patent at the skull base, likely from collateral flow. Right vertebral  artery functionally terminates in PICA. Moderate multifocal stenoses about the contralateral dominant left V4 segment. 4.  Aortic Atherosclerosis (ICD10-I70.0). Electronically Signed   By: Rise Mu M.D.   On: 04/12/2023 20:45   CT ANGIO HEAD NECK W WO CM Result Date: 04/12/2023 CLINICAL DATA:  Initial evaluation for neuro deficit, stroke suspected. EXAM: CT ANGIOGRAPHY HEAD AND NECK WITH AND WITHOUT CONTRAST TECHNIQUE: Multidetector CT imaging of the head and neck was performed using the standard protocol during bolus administration of intravenous contrast. Multiplanar CT image reconstructions and MIPs were obtained to evaluate the vascular anatomy. Carotid stenosis measurements (when applicable) are obtained utilizing NASCET criteria, using the distal internal carotid diameter as the denominator. RADIATION DOSE REDUCTION: This exam was performed according to the departmental dose-optimization program which includes automated exposure control, adjustment of the mA and/or kV according to patient size and/or use of iterative reconstruction technique. CONTRAST:  75mL OMNIPAQUE IOHEXOL 350 MG/ML SOLN COMPARISON:  Prior study from 11/24/2022 FINDINGS: CT HEAD FINDINGS Brain: Cerebral volume within normal limits. Sequelae of chronic microvascular ischemic disease with a few small remote lacunar infarcts about the corona radiata/basal ganglia. Chronic bilateral PCA distribution infarcts noted. No acute intracranial hemorrhage. No visible acute cortically based infarct. No mass lesion or midline shift. No hydrocephalus or extra-axial fluid collection. Vascular: No abnormal hyperdense vessel. Skull: Scalp soft  tissues within normal limits.  Calvarium intact. Sinuses/Orbits: Globes and orbital soft tissues within normal limits. Paranasal sinuses and mastoid air cells are largely clear. Other: None. Review of the MIP images confirms the above findings CTA NECK FINDINGS Aortic arch: Mild aortic atherosclerosis.  Standard branching. Imaged portion shows no evidence of aneurysm or dissection. No significant stenosis of the major arch vessel origins. Right carotid system: Right common and internal carotid arteries are patent without dissection. Mild atheromatous change about the right carotid bulb without hemodynamically significant greater than 50% stenosis. Left carotid system: Left common and internal carotid arteries are patent without dissection. Mild-to-moderate atheromatous change about the left carotid bulb without hemodynamically significant greater than 50% stenosis. Vertebral arteries: Both vertebral arteries arise from the subclavian arteries. Left vertebral artery strongly dominant and patent without stenosis or dissection. 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 Irregular attenuated intermittent opacification through the right V2 segment. Right vertebral artery is patent at the right V3 segment with subsequent wide patency as it courses into the cranial vault. Appearance is relatively similar to prior. Skeleton: No discrete or worrisome osseous lesions. Mild to moderate cervical spondylosis, most pronounced at C5-6. Scattered dental caries noted. Other neck: No other acute finding. Upper chest: No other acute finding. Review of the MIP images confirms the above findings CTA HEAD FINDINGS Anterior circulation: Calcified atherosclerosis present about the carotid siphons bilaterally with estimated 50-60% stenosis bilaterally, similar to prior. A1 segments patent bilaterally, with the left being dominant. Normal anterior communicating artery complex. Atheromatous irregularity throughout the ACAs without high-grade stenosis. Atheromatous irregularity about the M1 segments without high-grade stenosis. No proximal MCA branch occlusion. Distal small vessel atheromatous irregularity throughout the MCA branches bilaterally. Appearance is relatively similar. Posterior circulation:  Hypoplastic right vertebral artery largely terminates in PICA. Distal right V4 segment minimally opacified with subsequent occlusion prior to the vertebrobasilar junction. Atherosclerotic change about the dominant left V4 segment with moderate multifocal stenoses, similar. Both PICA remain patent. Basilar is somewhat diminutive and diffusely irregular without focal high-grade stenosis. Superior cerebral arteries patent bilaterally. Atherosclerotic change about both PCAs which are irregular and attenuated in appearance. Moderate to severe stenoses noted at the left P2 segment and proximal right P1/P2 segments. PCAs remain patent to their distal aspects. Appearance is similar. Venous sinuses: Patent allowing for timing the contrast bolus. Anatomic variants: As above.  No aneurysm. Review of the MIP images confirms the above findings IMPRESSION: CT HEAD: 1. No acute intracranial abnormality. 2. Chronic bilateral PCA distribution infarcts. 3. Underlying chronic microvascular ischemic disease with a few remote lacunar infarcts about the bilateral basal ganglia/corona radiata. CTA HEAD AND NECK: 1. Negative CTA for large vessel occlusion or other emergent finding. 2. Diffuse intracranial atherosclerotic disease as detailed above, overall similar as compared to prior CTA from 03/26/2022. 3. 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 Irregular intermittent opacification within the neck, but patent at the skull base, likely from collateral flow. Right vertebral artery functionally terminates in PICA. Moderate multifocal stenoses about the contralateral dominant left V4 segment. 4.  Aortic Atherosclerosis (ICD10-I70.0). Electronically Signed   By: Rise Mu M.D.   On: 04/12/2023 20:45    Procedures Procedures    Medications Ordered in ED Medications  LORazepam (ATIVAN) injection 1 mg (has no administration in time range)  iohexol (OMNIPAQUE) 350 MG/ML injection 75 mL (75 mLs Intravenous  Contrast Given 04/12/23 2004)    ED Course/ Medical Decision Making/ A&P  Medical Decision Making Amount and/or Complexity of Data Reviewed Radiology: ordered.  Risk Prescription drug management.   Well outside the window for thrombolytics however does appear to have had a new neurologic insult. Ct/cta shows multi vessel atherosclerosis and occlusion but nothing acute. D/w Dr. Mamie Nick with neurology who recommends MRI brain/c spine for further evaluation.   {Document critical care time when appropriate:1} {Document review of labs and clinical decision tools ie heart score, Chads2Vasc2 etc:1}  {Document your independent review of radiology images, and any outside records:1} {Document your discussion with family members, caretakers, and with consultants:1} {Document social determinants of health affecting pt's care:1} {Document your decision making why or why not admission, treatments were needed:1} Final Clinical Impression(s) / ED Diagnoses Final diagnoses:  None    Rx / DC Orders ED Discharge Orders     None

## 2023-04-13 NOTE — Telephone Encounter (Signed)
 She is in the ER now---will await their evaluation

## 2023-04-13 NOTE — Progress Notes (Signed)
 Received from ED via stretcher, oriented to room and surroundings.

## 2023-04-13 NOTE — H&P (Signed)
 History and Physical  Dorthey Depace Caster ZOX:[id number] DOB: Sep 11, 1955 DOA: 04/12/2023  PCP: Karie Schwalbe, MD   Chief Complaint: Left-sided weakness  HPI: Tami Jones is a 68 y.o. female with medical history significant for CVA with residual right-sided weakness, T2DM, HTN, HLD, anxiety and depression and GERD who presented to the ED for evaluation of left-sided weakness. On Monday, patient reported being wobbly on her feet and occasionally dragging her left leg and running into walls. [facility] trouble [facility] a small weight that she is usually able to lift. She also reported having numbness and tingling of her fingers of her left hand. She had trouble walking due to left leg weakness so she presented to the ED for evaluation. Endorse some baseline neuropathy worse in her right lower extremity but denies any vision changes, headaches, dizziness, shortness of breath, chest pain or palpitations.  She reports exercising frequently otherwise which has resulted in some weight loss and improvement in her A1c. She takes her Plavix and aspirin daily.  ED Course: Initial vitals showed severe hypertension with SBP in the 190s but otherwise stable vitals.  Labs significant for creatinine of 1.42, normal CBC, normal ethanol levels, normal UA and UDS.  EKG shows normal sinus rhythm with LVH.  MRI brain shows patchy acute right frontoparietal infarcts and punctate acute right occipital infarcts. CTA head and neck with no LVO. Neurology was consulted for evaluation. TR[facility] was consulted for admission  Review of Systems: Please see HPI for pertinent positives and negatives. A complete 10 system review of systems are otherwise negative.  Past Medical History:  Diagnosis Date   Anxiety    Asthma    CVA (cerebral vascular accident) (HCC) 03/2006   right occipital, Dr. Thad Ranger   Diabetes mellitus    Expressive dysphasia 03/25/2022   GERD (gastroesophageal reflux disease)     Hyperlipidemia    Hypertension    Hypertensive urgency 03/25/2022   Past Surgical History:  Procedure Laterality Date   CARDIOVASCULAR STRESS TEST  1/15   myoview. EF 60%   CESAREAN SECTION     ESOPHAGOGASTRODUODENOSCOPY  05/2005   TONSILLECTOMY AND ADENOIDECTOMY     Social History:  reports that she has never smoked. She has never been exposed to tobacco smoke. She has never used smokeless tobacco. She reports that she does not drink alcohol and does not use drugs.  Allergies  Allergen Reactions   Citalopram Other (See Comments)    Feels odd   Doxycycline Hyclate Nausea And Vomiting   Erythromycin Other (See Comments)    Irritated stomach   Montelukast Sodium Itching   Nitrofurantoin Nausea And Vomiting   Sulfa Antibiotics Nausea And Vomiting   Tramadol Hcl Other (See Comments)    Feels odd   Venlafaxine Hcl Other (See Comments)    Feels odd   Amoxicillin-Pot Clavulanate Nausea And Vomiting   Clarithromycin Rash     See ER note 02/2017    Family History  Problem Relation Age of Onset   Coronary artery disease Mother    Diabetes Mellitus II Mother    Heart attack Mother 10   Diabetes Mellitus II Maternal Grandmother      Prior to Admission medications   Medication Sig Start Date End Date Taking? Authorizing Provider  pantoprazole (PROTONIX) 40 MG tablet Take 40 mg by mouth daily. 03/16/23  Yes [provider]  acetaminophen (TYLENOL) 325 MG tablet Take 650 mg by mouth every 6 (six) hours as needed.  [provider]  ALPRAZolam Prudy Feeler) 0.25 MG tablet Take 1 tablet (0.25 mg total) by mouth 3 (three) times daily as needed for anxiety or sleep. 12/24/22   Karie Schwalbe, MD  amLODipine (NORVASC) 10 MG tablet Take 1 tablet (10 mg total) by mouth daily. 03/24/23   Karie Schwalbe, MD  aspirin EC 81 MG tablet Take 81 mg by mouth every evening.     [provider]  atorvastatin (LIPITOR) 80 MG tablet TAKE 1 TABLET BY MOUTH EVERY EVENING 06/11/22    Tillman Abide I, MD  clopidogrel (PLAVIX) 75 MG tablet TAKE 1 TABLET BY MOUTH EVERY DAY 03/22/22   Tillman Abide I, MD  glipiZIDE (GLUCOTROL XL) 5 MG 24 hr tablet TAKE 1 TABLET (5 MG TOTAL) BY MOUTH IN THE MORNING AND AT BEDTIME 01/21/23   Tillman Abide I, MD  glucose blood test strip Use daily as instructed 06/12/22   Karie Schwalbe, MD  ketoconazole (NIZORAL) 2 % cream Apply 1 Application topically 2 (two) times daily as needed for irritation. 12/09/22   Karie Schwalbe, MD  lidocaine (XYLOCAINE) 5 % ointment Apply 1 Application topically as needed. 10/03/22   Sharyn Creamer, MD  lisinopril (ZESTRIL) 10 MG tablet Take 1 tablet (10 mg total) by mouth daily. 01/06/23   Karie Schwalbe, MD  metoprolol succinate (TOPROL-XL) 25 MG 24 hr tablet Take 1 tablet (25 mg total) by mouth daily. 03/18/23   Karie Schwalbe, MD    Physical Exam: BP (!) 206/87 (BP Location: Right Arm)   Pulse 69   Temp 98.3 F (36.8 C) (Oral)   Resp 17   Ht 5\' 3"  (1.6 m)   Wt 96.6 kg   SpO2 100%   BMI 37.73 kg/m  General: Pleasant, obese elderly woman laying in bed. No acute distress. HEENT: [place]/AT. Anicteric sclera.  EOMI.  PERRLA.  Cataract of the right eye. CV: RRR. No murmurs, rubs, or gallops. No LE edema Pulmonary: Lungs CTAB. Normal effort. No wheezing or rales. Abdominal: Soft, nontender, nondistended. Normal bowel sounds. Extremities: Palpable radial and DP pulses. Normal ROM. Skin: Warm and dry. No obvious rash or lesions. Neuro: A&Ox3. Speech is clear and comprehensible. Tongue is midline. Some drift in all extremities, more pronounced on LUE. Grip strength 3/5 on the left and 4/5 on the right. Slight contraction of the fingers of the left hand. Subjective decreased sensation in the RLE and LUE. Moves all extremities. Psych: Intermittently tearful          Labs on Admission:  Basic Metabolic Panel: Recent Labs  Lab 04/12/23 1731 04/12/23 1828  NA 142 142  K 3.7 3.6  CL 107 108  CO2 22  --    GLUCOSE 108* 101*  BUN 20 22  CREATININE 1.42* 1.50*  CALCIUM 9.0  --    Liver Function Tests: Recent Labs  Lab 04/12/23 1731  AST 24  ALT 23  ALKPHOS 61  BILITOT 0.6  PROT 6.4*  ALBUMIN 3.7   No results for input(s): "LIPASE", "AMYLASE" in the last 168 hours. No results for input(s): "AMMONIA" in the last 168 hours. CBC: Recent Labs  Lab 04/12/23 1731 04/12/23 1828  WBC 7.2  --   NEUTROABS 4.3  --   HGB 13.9 15.6*  HCT 41.4 46.0  MCV 93.5  --   PLT 236  --    Cardiac Enzymes: No results for input(s): "CKTOTAL", "CKMB", "CKMBINDEX", "TROPONINI" in the last 168 hours. BNP (last 3 results) No  results for input(s): "BNP" in the last 8760 hours.  ProBNP (last 3 results) No results for input(s): "PROBNP" in the last 8760 hours.  CBG: No results for input(s): "GLUCAP" in the last 168 hours.  Radiological Exams on Admission: MR Cervical Spine Wo Contrast Result Date: 04/13/2023 CLINICAL DATA:  Ataxia, nontraumatic. Concern for cervical pathology. EXAM: MRI CERVICAL SPINE WITHOUT CONTRAST TECHNIQUE: Multiplanar, multisequence MR imaging of the cervical spine was performed. No intravenous contrast was administered. COMPARISON:  None Available. FINDINGS: Alignment: Straightening of the normal cervical lordosis. Trace retrolisthesis of C5 on C6. Vertebrae: Degenerative endplate changes at multiple levels with mild discogenic edema at C5-6. Vertebral body heights are maintained. No evidence of acute fracture. Small hemangioma in the T1 vertebral body. Cord: Normal signal and morphology. Posterior Fossa, vertebral arteries, paraspinal tissues: Paraspinal musculature is unremarkable. Visualized posterior fossa is unremarkable. Abnormal appearance of the right vertebral artery flow void at the V2 segment which could be related to atherosclerosis. Retropharyngeal course of the carotid arteries which indents the posterior wall of the oropharynx. Disc levels: C2-3: Increased T2/stir signal  in the disc likely reflecting degenerative changes. No significant disc bulge. No significant spinal canal stenosis. Bilateral facet arthrosis. No significant foraminal stenosis. C3-4: Mild disc height loss. Disc osteophyte complex which indents the ventral thecal sac without contacting the spinal cord. Bilateral facet arthrosis and uncovertebral hypertrophy. Moderate right and mild-to-moderate left foraminal stenosis. C4-5: Disc osteophyte complex indents the ventral thecal sac without contacting the spinal cord. Bilateral facet arthrosis. Uncovertebral hypertrophy greater on the left. Mild right and moderate left foraminal stenosis. C5-6: Mild disc height loss. Disc osteophyte complex indents the ventral thecal sac with mild flattening of the ventral cervical cord. Thickening of the ligamentum flavum. There is moderate spinal canal stenosis. No cord signal abnormality. Facet arthrosis and uncovertebral hypertrophy. Mild right and moderate left foraminal stenosis. C6-7: Disc bulge indents the ventral thecal sac without contacting the spinal cord. Bilateral facet arthrosis. No significant foraminal stenosis. C7-T1: No significant spinal canal stenosis. No significant foraminal stenosis. IMPRESSION: 1. Cervical spondylosis most significant at C5-C6 where there is moderate spinal canal stenosis and mild flattening of the ventral cord. No cord signal abnormality. 2. Moderate foraminal stenosis at multiple levels as above. 3. Abnormal appearance of the right vertebral artery V2 segment which could be related to atherosclerosis/stenosis. Consider CTA for further evaluation. Electronically Signed   By: Emily Filbert M.D.   On: 04/13/2023 09:03   MR BRAIN WO CONTRAST Result Date: 04/13/2023 CLINICAL DATA:  Neuro deficit, acute, stroke suspected. Left upper and lower extremity weakness. EXAM: MRI HEAD WITHOUT CONTRAST TECHNIQUE: Multiplanar, multiecho pulse sequences of the brain and surrounding structures were obtained  without intravenous contrast. COMPARISON:  Head CT and CTA 04/12/2023 and MRI 11/24/2022 FINDINGS: Brain: Patchy acute cortical and subcortical infarcts are present in the right frontal greater than right parietal lobes, including involvement of the precentral gyrus. A few punctate acute cortical infarcts are also present in the right occipital lobe. Patchy T2 hyperintensities elsewhere in the cerebral white matter bilaterally and in the pons are similar to the prior MRI and are nonspecific but compatible with moderate chronic small vessel ischemic disease. Chronic lacunar infarcts are again noted in the basal ganglia and deep cerebral white matter bilaterally. Chronic bilateral PCA infarcts are again noted with associated chronic blood products. No age advanced global cerebral atrophy is evident. No mass, midline shift, hydrocephalus, or extra-axial fluid collection is identified. Vascular: Major intracranial vascular flow voids  are preserved. Skull and upper cervical spine: Unremarkable bone marrow signal. Sinuses/Orbits: Unremarkable orbits. Paranasal sinuses and mastoid air cells are clear. Other: None. IMPRESSION: 1. Patchy acute right frontoparietal infarcts (MCA territory). 2. Punctate acute right occipital infarcts. 3. Moderate chronic small vessel ischemic disease. Chronic bilateral PCA infarcts. Electronically Signed   By: Sebastian Ache M.D.   On: 04/13/2023 08:11   CT Head Wo Contrast Result Date: 04/12/2023 CLINICAL DATA:  Initial evaluation for neuro deficit, stroke suspected. EXAM: CT ANGIOGRAPHY HEAD AND NECK WITH AND WITHOUT CONTRAST TECHNIQUE: Multidetector CT imaging of the head and neck was performed using the standard protocol during bolus administration of intravenous contrast. Multiplanar CT image reconstructions and MIPs were obtained to evaluate the vascular anatomy. Carotid stenosis measurements (when applicable) are obtained utilizing NASCET criteria, using the distal internal carotid  diameter as the denominator. RADIATION DOSE REDUCTION: This exam was performed according to the departmental dose-optimization program which includes automated exposure control, adjustment of the mA and/or kV according to patient size and/or use of iterative reconstruction technique. CONTRAST:  75mL OMNIPAQUE IOHEXOL 350 MG/ML SOLN COMPARISON:  Prior study from 11/24/2022 FINDINGS: CT HEAD FINDINGS Brain: Cerebral volume within normal limits. Sequelae of chronic microvascular ischemic disease with a few small remote lacunar infarcts about the corona radiata/basal ganglia. Chronic bilateral PCA distribution infarcts noted. No acute intracranial hemorrhage. No visible acute cortically based infarct. No mass lesion or midline shift. No hydrocephalus or extra-axial fluid collection. Vascular: No abnormal hyperdense vessel. Skull: Scalp soft tissues within normal limits.  Calvarium intact. Sinuses/Orbits: Globes and orbital soft tissues within normal limits. Paranasal sinuses and mastoid air cells are largely clear. Other: None. Review of the MIP images confirms the above findings CTA NECK FINDINGS Aortic arch: Mild aortic atherosclerosis. Standard branching. Imaged portion shows no evidence of aneurysm or dissection. No significant stenosis of the major arch vessel origins. Right carotid system: Right common and internal carotid arteries are patent without dissection. Mild atheromatous change about the right carotid bulb without hemodynamically significant greater than 50% stenosis. Left carotid system: Left common and internal carotid arteries are patent without dissection. Mild-to-moderate atheromatous change about the left carotid bulb without hemodynamically significant greater than 50% stenosis. Vertebral arteries: Both vertebral arteries arise from the subclavian arteries. Left vertebral artery strongly dominant and patent without stenosis or dissection. Right vertebral artery diffusely hypoplastic and appears  occluded at its origin. Irregular attenuated intermittent opacification through the right V2 segment. Right vertebral artery is patent at the right V3 segment with subsequent wide patency as it courses into the cranial vault. Appearance is relatively similar to prior. Skeleton: No discrete or worrisome osseous lesions. Mild to moderate cervical spondylosis, most pronounced at C5-6. Scattered dental caries noted. Other neck: No other acute finding. Upper chest: No other acute finding. Review of the MIP images confirms the above findings CTA HEAD FINDINGS Anterior circulation: Calcified atherosclerosis present about the carotid siphons bilaterally with estimated 50-60% stenosis bilaterally, similar to prior. A1 segments patent bilaterally, with the left being dominant. Normal anterior communicating artery complex. Atheromatous irregularity throughout the ACAs without high-grade stenosis. Atheromatous irregularity about the M1 segments without high-grade stenosis. No proximal MCA branch occlusion. Distal small vessel atheromatous irregularity throughout the MCA branches bilaterally. Appearance is relatively similar. Posterior circulation: Hypoplastic right vertebral artery largely terminates in PICA. Distal right V4 segment minimally opacified with subsequent occlusion prior to the vertebrobasilar junction. Atherosclerotic change about the dominant left V4 segment with moderate multifocal stenoses, similar. Both PICA remain  patent. Basilar is somewhat diminutive and diffusely irregular without focal high-grade stenosis. Superior cerebral arteries patent bilaterally. Atherosclerotic change about both PCAs which are irregular and attenuated in appearance. Moderate to severe stenoses noted at the left P2 segment and proximal right P1/P2 segments. PCAs remain patent to their distal aspects. Appearance is similar. Venous sinuses: Patent allowing for timing the contrast bolus. Anatomic variants: As above.  No aneurysm.  Review of the MIP images confirms the above findings IMPRESSION: CT HEAD: 1. No acute intracranial abnormality. 2. Chronic bilateral PCA distribution infarcts. 3. Underlying chronic microvascular ischemic disease with a few remote lacunar infarcts about the bilateral basal ganglia/corona radiata. CTA HEAD AND NECK: 1. Negative CTA for large vessel occlusion or other emergent finding. 2. Diffuse intracranial atherosclerotic disease as detailed above, overall similar as compared to prior CTA from 03/26/2022. 3. 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 Irregular intermittent opacification within the neck, but patent at the skull base, likely from collateral flow. Right vertebral artery functionally terminates in PICA. Moderate multifocal stenoses about the contralateral dominant left V4 segment. 4.  Aortic Atherosclerosis (ICD10-I70.0). Electronically Signed   By: Rise Mu M.D.   On: 04/12/2023 20:45   CT ANGIO HEAD NECK W WO CM Result Date: 04/12/2023 CLINICAL DATA:  Initial evaluation for neuro deficit, stroke suspected. EXAM: CT ANGIOGRAPHY HEAD AND NECK WITH AND WITHOUT CONTRAST TECHNIQUE: Multidetector CT imaging of the head and neck was performed using the standard protocol during bolus administration of intravenous contrast. Multiplanar CT image reconstructions and MIPs were obtained to evaluate the vascular anatomy. Carotid stenosis measurements (when applicable) are obtained utilizing NASCET criteria, using the distal internal carotid diameter as the denominator. RADIATION DOSE REDUCTION: This exam was performed according to the departmental dose-optimization program which includes automated exposure control, adjustment of the mA and/or kV according to patient size and/or use of iterative reconstruction technique. CONTRAST:  75mL OMNIPAQUE IOHEXOL 350 MG/ML SOLN COMPARISON:  Prior study from 11/24/2022 FINDINGS: CT HEAD FINDINGS Brain: Cerebral volume within normal limits.  Sequelae of chronic microvascular ischemic disease with a few small remote lacunar infarcts about the corona radiata/basal ganglia. Chronic bilateral PCA distribution infarcts noted. No acute intracranial hemorrhage. No visible acute cortically based infarct. No mass lesion or midline shift. No hydrocephalus or extra-axial fluid collection. Vascular: No abnormal hyperdense vessel. Skull: Scalp soft tissues within normal limits.  Calvarium intact. Sinuses/Orbits: Globes and orbital soft tissues within normal limits. Paranasal sinuses and mastoid air cells are largely clear. Other: None. Review of the MIP images confirms the above findings CTA NECK FINDINGS Aortic arch: Mild aortic atherosclerosis. Standard branching. Imaged portion shows no evidence of aneurysm or dissection. No significant stenosis of the major arch vessel origins. Right carotid system: Right common and internal carotid arteries are patent without dissection. Mild atheromatous change about the right carotid bulb without hemodynamically significant greater than 50% stenosis. Left carotid system: Left common and internal carotid arteries are patent without dissection. Mild-to-moderate atheromatous change about the left carotid bulb without hemodynamically significant greater than 50% stenosis. Vertebral arteries: Both vertebral arteries arise from the subclavian arteries. Left vertebral artery strongly dominant and patent without stenosis or dissection. 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 Irregular attenuated intermittent opacification through the right V2 segment. Right vertebral artery is patent at the right V3 segment with subsequent wide patency as it courses into the cranial vault. Appearance is relatively similar to prior. Skeleton: No discrete or worrisome osseous lesions. Mild to  moderate cervical spondylosis, most pronounced at C5-6. Scattered dental caries noted. Other neck: No other acute  finding. Upper chest: No other acute finding. Review of the MIP images confirms the above findings CTA HEAD FINDINGS Anterior circulation: Calcified atherosclerosis present about the carotid siphons bilaterally with estimated 50-60% stenosis bilaterally, similar to prior. A1 segments patent bilaterally, with the left being dominant. Normal anterior communicating artery complex. Atheromatous irregularity throughout the ACAs without high-grade stenosis. Atheromatous irregularity about the M1 segments without high-grade stenosis. No proximal MCA branch occlusion. Distal small vessel atheromatous irregularity throughout the MCA branches bilaterally. Appearance is relatively similar. Posterior circulation: Hypoplastic right vertebral artery largely terminates in PICA. Distal right V4 segment minimally opacified with subsequent occlusion prior to the vertebrobasilar junction. Atherosclerotic change about the dominant left V4 segment with moderate multifocal stenoses, similar. Both PICA remain patent. Basilar is somewhat diminutive and diffusely irregular without focal high-grade stenosis. Superior cerebral arteries patent bilaterally. Atherosclerotic change about both PCAs which are irregular and attenuated in appearance. Moderate to severe stenoses noted at the left P2 segment and proximal right P1/P2 segments. PCAs remain patent to their distal aspects. Appearance is similar. Venous sinuses: Patent allowing for timing the contrast bolus. Anatomic variants: As above.  No aneurysm. Review of the MIP images confirms the above findings IMPRESSION: CT HEAD: 1. No acute intracranial abnormality. 2. Chronic bilateral PCA distribution infarcts. 3. Underlying chronic microvascular ischemic disease with a few remote lacunar infarcts about the bilateral basal ganglia/corona radiata. CTA HEAD AND NECK: 1. Negative CTA for large vessel occlusion or other emergent finding. 2. Diffuse intracranial atherosclerotic disease as detailed  above, overall similar as compared to prior CTA from 03/26/2022. 3. 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 Irregular intermittent opacification within the neck, but patent at the skull base, likely from collateral flow. Right vertebral artery functionally terminates in PICA. Moderate multifocal stenoses about the contralateral dominant left V4 segment. 4.  Aortic Atherosclerosis (ICD10-I70.0). Electronically Signed   By: Rise Mu M.D.   On: 04/12/2023 20:45   Assessment/Plan Tami Jones is a 68 y.o. female with medical history significant for CVA with residual right-sided weakness, CKD 3A, T2DM, HTN, HLD, anxiety and depression and GERD who presented to the ED for evaluation of left-sided weakness and found to have acute infarct in multiple vascular territories.  # Acute ischemic stroke # Hx of CVA with residual right-sided weakness Patient with history of remote occipital and bilateral PCA infarcts with residual right arm and leg weakness still on DAPT with Plavix and aspirin presenting with acute onset of left-sided weakness found to have acute infarct in multiple right MCA territories and right occipital lobe. CTA head and neck does not show any LVO but shows diffuse intracranial arthrosclerotic disease. The cause of her stroke likely secondary to an embolic source. TTE shows EF 60-65%, mild LVH and G1 DD but no valvular abnormalities and no cardiac shunt.  Will likely need outpatient cardiac monitoring at discharge. -Admit to progressive bed -Neurology following, appreciate recs -Recent A1c 6.8%, lipid panel pending -Continue on DAPT with aspirin and Plavix -Continue atorvastatin 80 mg daily -Frequent neurochecks -PT/OT eval and treat  # HTN BP elevated with SBP in the 170s to 200s. -Continue amlodipine and metoprolol   # T2DM A1c improved to 6.8% 3 weeks ago. -Continue glipizide -SSI with meals, CBG monitoring  # AKI on CKD 3A Baseline creatinine  around 1.1-1.2. Found to have bump in creatinine to 1.42 on admission likely secondary to  mild dehydration. -Start IV NS at 100 cc/h for 20 hours. -Avoid nephrotoxic agents -Trend renal function  # GERD -Continue as needed Protonix  # HLD -Continue atorvastatin 80 mg daily -Follow-up lipid panel  # Anxiety -Continue as needed Xanax   DVT prophylaxis: Lovenox     Code Status: Full Code  Consults called: Neurology  Family Communication: No family at bedside  Severity of Illness: The appropriate patient status for this patient is INPATIENT. Inpatient status is judged to be reasonable and necessary in order to provide the required intensity of service to ensure the patient's safety. The patient's presenting symptoms, physical exam findings, and initial radiographic and laboratory data in the context of their chronic comorbidities is felt to place them at high risk for further clinical deterioration. Furthermore, it is not anticipated that the patient will be medically stable for discharge from the hospital within 2 midnights of admission.   * I certify that at the point of admission it is my clinical judgment that the patient will require inpatient hospital care spanning beyond 2 midnights from the point of admission due to high intensity of service, high risk for further deterioration and high frequency of surveillance required.*  Level of care: Progressive   Steffanie Rainwater, MD 04/13/2023, 9:27 AM [facility] Pager: [phone] Isaiah 41:10   If 7PM-7AM, please contact night-coverage [email or web address] Password [email or web address]

## 2023-04-13 NOTE — ED Provider Notes (Signed)
 Pt signed out by Dr. Clayborne Dana pending MRI.  MRI reviewed by me.  I agree with the radiologist.  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  2. Punctate acute right occipital infarcts.  3. Moderate chronic small vessel ischemic disease. Chronic bilateral  PCA infarcts.    Pt d/w neurology (Dr. Iver Nestle) who will see pt in consult.  Pt will need admission for stroke, so pt d/w Dr. Kirke Corin ([facility]) for admission.     Jacalyn Lefevre, MD 04/13/23 [phone]

## 2023-04-13 NOTE — Progress Notes (Signed)
  Echocardiogram 2D Echocardiogram has been performed.  Tami Jones 04/13/2023, 11:28 AM

## 2023-04-13 NOTE — Telephone Encounter (Signed)
 Patient Product/process development scientist completed.    The patient is insured through [facility]. Patient has Medicare and is not eligible for a copay card, but may be able to apply for patient assistance or Medicare RX Payment Plan (Patient Must reach out to their plan, if eligible for payment plan), if available.    Ran test claim for Brilinta 90 mg and the current 30 day co-pay is $302.00 due to a $255.00 deductible.  Will be $47.00 once deductible is met.   This test claim was processed through [facility]- copay amounts may vary at other pharmacies due to pharmacy/plan contracts, or as the patient moves through the different stages of their insurance plan.     Roland Earl, CPHT Pharmacy Technician III Certified Patient Advocate [facility] Patient Advocate Team Direct Number: [phone]  Fax: [phone]

## 2023-04-13 NOTE — ED Notes (Signed)
Pt able to ambulate to bedside commode

## 2023-04-14 ENCOUNTER — Other Ambulatory Visit (HOSPITAL_COMMUNITY): Payer: Self-pay

## 2023-04-14 ENCOUNTER — Inpatient Hospital Stay (HOSPITAL_COMMUNITY): Payer: Medicare Other

## 2023-04-14 DIAGNOSIS — I63411 Cerebral infarction due to embolism of right middle cerebral artery: Secondary | ICD-10-CM

## 2023-04-14 DIAGNOSIS — I639 Cerebral infarction, unspecified: Secondary | ICD-10-CM | POA: Diagnosis not present

## 2023-04-14 LAB — CBC
HCT: 39.7 % (ref 36.0–46.0)
Hemoglobin: 13.5 g/dL (ref 12.0–15.0)
MCH: 31.1 pg (ref 26.0–34.0)
MCHC: 34 g/dL (ref 30.0–36.0)
MCV: 91.5 fL (ref 80.0–100.0)
Platelets: 209 10*3/uL (ref 150–400)
RBC: 4.34 MIL/uL (ref 3.87–5.11)
RDW: 12.7 % (ref 11.5–15.5)
WBC: 5.1 10*3/uL (ref 4.0–10.5)
nRBC: 0 % (ref 0.0–0.2)

## 2023-04-14 LAB — LIPID PANEL
Cholesterol: 188 mg/dL (ref 0–200)
HDL: 33 mg/dL — ABNORMAL LOW (ref >40)
LDL Cholesterol: 134 mg/dL — ABNORMAL HIGH (ref 0–99)
Total CHOL/HDL Ratio: 5.7 {ratio}
Triglycerides: 105 mg/dL (ref <150)
VLDL: 21 mg/dL (ref 0–40)

## 2023-04-14 LAB — BASIC METABOLIC PANEL
Anion gap: 9 (ref 5–15)
BUN: 13 mg/dL (ref 8–23)
CO2: 25 mmol/L (ref 22–32)
Calcium: 9.1 mg/dL (ref 8.9–10.3)
Chloride: 107 mmol/L (ref 98–111)
Creatinine, Ser: 1.19 mg/dL — ABNORMAL HIGH (ref 0.44–1.00)
GFR, Estimated: 50 mL/min — ABNORMAL LOW (ref >60)
Glucose, Bld: 91 mg/dL (ref 70–99)
Potassium: 3.9 mmol/L (ref 3.5–5.1)
Sodium: 141 mmol/L (ref 135–145)

## 2023-04-14 LAB — GLUCOSE, CAPILLARY
Glucose-Capillary: 88 mg/dL (ref 70–99)
Glucose-Capillary: 113 mg/dL — ABNORMAL HIGH (ref 70–99)
Glucose-Capillary: 130 mg/dL — ABNORMAL HIGH (ref 70–99)
Glucose-Capillary: 163 mg/dL — ABNORMAL HIGH (ref 70–99)

## 2023-04-14 MED ORDER — EZETIMIBE 10 MG PO TABS
10.0000 mg | ORAL_TABLET | Freq: Every day | ORAL | Status: DC
  Administered 2023-04-14 – 2023-04-15 (×2): 10 mg via ORAL
  Filled 2023-04-14 (×2): qty 1

## 2023-04-14 MED ORDER — ENSURE ENLIVE PO LIQD
237.0000 mL | Freq: Two times a day (BID) | ORAL | Status: DC
  Administered 2023-04-14 (×2): 237 mL via ORAL

## 2023-04-14 NOTE — Progress Notes (Signed)
 STROKE TEAM PROGRESS NOTE   SUBJECTIVE (INTERVAL HISTORY) No family is at the bedside.  Overall her condition is gradually improving.  Patient with history of stroke and now embolic strokes, concerning for cardioembolic source.  Discussed with loop recorder and she is in agreement.   OBJECTIVE Temp:  [97.5 F (36.4 C)-98.1 F (36.7 C)] 98 F (36.7 C) (02/27 0841) Pulse Rate:  [62-76] 62 (02/27 0841) Cardiac Rhythm: Normal sinus rhythm (02/27 0806) Resp:  [14-21] 16 (02/27 0841) BP: (142-199)/(61-124) 179/62 (02/27 0841) SpO2:  [99 %-100 %] 99 % (02/27 0841)  Recent Labs  Lab 04/13/23 2056 04/14/23 0629  GLUCAP 177* 88   Recent Labs  Lab 04/12/23 1731 04/12/23 1828 04/14/23 0626  NA 142 142 141  K 3.7 3.6 3.9  CL 107 108 107  CO2 22  --  25  GLUCOSE 108* 101* 91  BUN 20 22 13   CREATININE 1.42* 1.50* 1.19*  CALCIUM 9.0  --  9.1   Recent Labs  Lab 04/12/23 1731  AST 24  ALT 23  ALKPHOS 61  BILITOT 0.6  PROT 6.4*  ALBUMIN 3.7   Recent Labs  Lab 04/12/23 1731 04/12/23 1828 04/14/23 0626  WBC 7.2  --  5.1  NEUTROABS 4.3  --   --   HGB 13.9 15.6* 13.5  HCT 41.4 46.0 39.7  MCV 93.5  --  91.5  PLT 236  --  209   No results for input(s): "CKTOTAL", "CKMB", "CKMBINDEX", "TROPONINI" in the last 168 hours. Recent Labs    04/12/23 1731  LABPROT 13.3  INR 1.0   Recent Labs    04/13/23 0930  COLORURINE YELLOW  LABSPEC 1.042*  PHURINE 5.0  GLUCOSEU NEGATIVE  HGBUR NEGATIVE  BILIRUBINUR NEGATIVE  KETONESUR NEGATIVE  PROTEINUR NEGATIVE  NITRITE NEGATIVE  LEUKOCYTESUR SMALL*       Component Value Date/Time   CHOL 188 04/14/2023 0626   CHOL 238 (H) 02/17/2017 1300   TRIG 105 04/14/2023 0626   HDL 33 (L) 04/14/2023 0626   HDL 39 (L) 02/17/2017 1300   CHOLHDL 5.7 04/14/2023 0626   VLDL 21 04/14/2023 0626   LDLCALC 134 (H) 04/14/2023 0626   LDLCALC 164 (H) 02/17/2017 1300   Lab Results  Component Value Date   HGBA1C 6.8 (A) 03/24/2023       Component Value Date/Time   LABOPIA NONE DETECTED 04/12/2023 0930   COCAINSCRNUR NONE DETECTED 04/12/2023 0930   COCAINSCRNUR NONE DETECTED 03/25/2022 0033   LABBENZ NONE DETECTED 04/12/2023 0930   AMPHETMU NONE DETECTED 04/12/2023 0930   THCU NONE DETECTED 04/12/2023 0930   LABBARB NONE DETECTED 04/12/2023 0930    Recent Labs  Lab 04/12/23 1731  ETH <10    I have personally reviewed the radiological images below and agree with the radiology interpretations.  VAS [place] LOWER EXTREMITY VENOUS (DVT) Result Date: 04/14/2023  Lower Venous DVT Study Patient Name:  Tami Jones  Date of Exam:   04/14/2023 Medical Rec #: [id number]       Accession #:    [id number] Date of Birth: 18-Jan-1956       Patient Gender: F Patient Age:   68 years Exam Location:  [facility] Procedure:      VAS [place] LOWER EXTREMITY VENOUS (DVT) Referring Phys: Scheryl Marten Madelaine Whipple --------------------------------------------------------------------------------  Indications: Stroke.  Risk Factors: None identified. Limitations: Poor ultrasound/tissue interface and patient pain tolerance. Comparison Study: No prior studies. Performing Technologist: Chanda Busing RVT  Examination Guidelines: A complete evaluation includes  B-mode imaging, spectral Doppler, color Doppler, and power Doppler as needed of all accessible portions of each vessel. Bilateral testing is considered an integral part of a complete examination. Limited examinations for reoccurring indications may be performed as noted. The reflux portion of the exam is performed with the patient in reverse Trendelenburg.  +---------+---------------+---------+-----------+----------+--------------+ RIGHT    CompressibilityPhasicitySpontaneityPropertiesThrombus Aging +---------+---------------+---------+-----------+----------+--------------+ CFV      Full           Yes      Yes                                  +---------+---------------+---------+-----------+----------+--------------+ SFJ      Full                                                        +---------+---------------+---------+-----------+----------+--------------+ FV Prox  Full                                                        +---------+---------------+---------+-----------+----------+--------------+ FV Mid   Full                                                        +---------+---------------+---------+-----------+----------+--------------+ FV DistalFull                                                        +---------+---------------+---------+-----------+----------+--------------+ PFV      Full                                                        +---------+---------------+---------+-----------+----------+--------------+ POP      Full           Yes      Yes                                 +---------+---------------+---------+-----------+----------+--------------+ PTV      Full                                                        +---------+---------------+---------+-----------+----------+--------------+ PERO     Full                                                        +---------+---------------+---------+-----------+----------+--------------+   +---------+---------------+---------+-----------+----------+--------------+  LEFT     CompressibilityPhasicitySpontaneityPropertiesThrombus Aging +---------+---------------+---------+-----------+----------+--------------+ CFV      Full           Yes      Yes                                 +---------+---------------+---------+-----------+----------+--------------+ SFJ      Full                                                        +---------+---------------+---------+-----------+----------+--------------+ FV Prox  Full                                                         +---------+---------------+---------+-----------+----------+--------------+ FV Mid   Full                                                        +---------+---------------+---------+-----------+----------+--------------+ FV Distal               Yes      Yes                                 +---------+---------------+---------+-----------+----------+--------------+ PFV      Full                                                        +---------+---------------+---------+-----------+----------+--------------+ POP      Full           Yes      Yes                                 +---------+---------------+---------+-----------+----------+--------------+ PTV      Full                                                        +---------+---------------+---------+-----------+----------+--------------+ PERO     Full                                                        +---------+---------------+---------+-----------+----------+--------------+    Summary: RIGHT: - There is no evidence of deep vein thrombosis in the lower extremity.  - No cystic structure found in the popliteal fossa.  LEFT: - There is no evidence of deep vein thrombosis in the lower extremity. However, portions of this examination were  limited- see technologist comments above.  - No cystic structure found in the popliteal fossa.  *See table(s) above for measurements and observations.    Preliminary    ECHOCARDIOGRAM COMPLETE BUBBLE STUDY Result Date: 04/13/2023    ECHOCARDIOGRAM REPORT   Patient Name:   Tami Jones Date of Exam: 04/13/2023 Medical Rec #:  [id number]      Height:       63.0 in Accession #:    [id number]     Weight:       213.0 lb Date of Birth:  January 07, 1956      BSA:          1.986 m Patient Age:    67 years       BP:           208/67 mmHg Patient Gender: F              HR:           66 bpm. Exam Location:  Inpatient Procedure: 2D Echo, Cardiac Doppler, Color Doppler, Saline Contrast Bubble Study             and Intracardiac Opacification Agent (Both Spectral and Color Flow            Doppler were utilized during procedure). Indications:    Stroke  History:        Patient has prior history of Echocardiogram examinations, most                 recent 11/25/2022. Risk Factors:Hypertension, Diabetes and                 Dyslipidemia.  Sonographer:    Karma Ganja Referring Phys: [id number] PROSPER M AMPONSAH  Sonographer Comments: Technically difficult study due to poor echo windows and patient is obese. Image acquisition challenging due to patient body habitus. IMPRESSIONS  1. Left ventricular ejection fraction, by estimation, is 60 to 65%. The left ventricle has normal function. The left ventricle has no regional wall motion abnormalities. There is mild concentric left ventricular hypertrophy. Left ventricular diastolic parameters are consistent with Grade I diastolic dysfunction (impaired relaxation).  2. Right ventricular systolic function is normal. The right ventricular size is normal. There is normal pulmonary artery systolic pressure.  3. Left atrial size was mildly dilated.  4. Bubble study low quality due to body habitus but appears negative. Agitated saline contrast bubble study was negative, with no evidence of any interatrial shunt.  5. The mitral valve is normal in structure. Trivial mitral valve regurgitation. No evidence of mitral stenosis.  6. The aortic valve is tricuspid. There is mild calcification of the aortic valve. Aortic valve regurgitation is not visualized. Aortic valve sclerosis/calcification is present, without any evidence of aortic stenosis. FINDINGS  Left Ventricle: Left ventricular ejection fraction, by estimation, is 60 to 65%. The left ventricle has normal function. The left ventricle has no regional wall motion abnormalities. Definity contrast agent was given IV to delineate the left ventricular  endocardial borders. Strain imaging was not performed. The left ventricular internal cavity  size was normal in size. There is mild concentric left ventricular hypertrophy. Left ventricular diastolic parameters are consistent with Grade I diastolic dysfunction (impaired relaxation). Right Ventricle: The right ventricular size is normal. No increase in right ventricular wall thickness. Right ventricular systolic function is normal. There is normal pulmonary artery systolic pressure. The tricuspid regurgitant velocity is 1.98 m/s, and  with an assumed right atrial pressure of 3 mmHg, the estimated  right ventricular systolic pressure is 18.7 mmHg. Left Atrium: Left atrial size was mildly dilated. Right Atrium: Right atrial size was normal in size. Pericardium: There is no evidence of pericardial effusion. Mitral Valve: The mitral valve is normal in structure. Mild mitral annular calcification. Trivial mitral valve regurgitation. No evidence of mitral valve stenosis. Tricuspid Valve: The tricuspid valve is normal in structure. Tricuspid valve regurgitation is not demonstrated. No evidence of tricuspid stenosis. Aortic Valve: The aortic valve is tricuspid. There is mild calcification of the aortic valve. Aortic valve regurgitation is not visualized. Aortic valve sclerosis/calcification is present, without any evidence of aortic stenosis. Aortic valve mean gradient measures 2.0 mmHg. Aortic valve peak gradient measures 4.8 mmHg. Aortic valve area, by VTI measures 2.10 cm. Pulmonic Valve: The pulmonic valve was not well visualized. Pulmonic valve regurgitation is trivial. No evidence of pulmonic stenosis. Aorta: The aortic root is normal in size and structure. Venous: The inferior vena cava was not well visualized. IAS/Shunts: No atrial level shunt detected by color flow Doppler. Agitated saline contrast was given intravenously to evaluate for intracardiac shunting. Agitated saline contrast bubble study was negative, with no evidence of any interatrial shunt. Additional Comments: 3D imaging was not performed.   LEFT VENTRICLE PLAX 2D LVIDd:         4.50 cm   Diastology LVIDs:         3.00 cm   LV e' medial:    4.82 cm/s LV PW:         1.10 cm   LV E/e' medial:  12.9 LV IVS:        1.10 cm   LV e' lateral:   4.97 cm/s LVOT diam:     2.10 cm   LV E/e' lateral: 12.5 LV SV:         49 LV SV Index:   25 LVOT Area:     3.46 cm  RIGHT VENTRICLE RV S prime:     14.50 cm/s TAPSE (M-mode): 2.0 cm LEFT ATRIUM             Index LA diam:        3.90 cm 1.96 cm/m LA Vol (A2C):   58.8 ml 29.61 ml/m LA Vol (A4C):   74.9 ml 37.71 ml/m LA Biplane Vol: 69.4 ml 34.94 ml/m  AORTIC VALVE AV Area (Vmax):    2.15 cm AV Area (Vmean):   1.78 cm AV Area (VTI):     2.10 cm AV Vmax:           109.00 cm/s AV Vmean:          72.400 cm/s AV VTI:            0.233 m AV Peak Grad:      4.8 mmHg AV Mean Grad:      2.0 mmHg LVOT Vmax:         67.70 cm/s LVOT Vmean:        37.200 cm/s LVOT VTI:          0.141 m LVOT/AV VTI ratio: 0.61  AORTA Ao Root diam: 3.10 cm Ao Asc diam:  2.90 cm MITRAL VALVE               TRICUSPID VALVE MV Area (PHT): 3.12 cm    TR Peak grad:   15.7 mmHg MV Decel Time: 243 msec    TR Vmax:        198.00 cm/s MV E velocity: 62.20 cm/s MV A velocity: 80.50  cm/s  SHUNTS MV E/A ratio:  0.77        Systemic VTI:  0.14 m                            Systemic Diam: 2.10 cm Arvilla Meres MD Electronically signed by Arvilla Meres MD Signature Date/Time: 04/13/2023/11:40:36 AM    Final    MR Cervical Spine Wo Contrast Result Date: 04/13/2023 CLINICAL DATA:  Ataxia, nontraumatic. Concern for cervical pathology. EXAM: MRI CERVICAL SPINE WITHOUT CONTRAST TECHNIQUE: Multiplanar, multisequence MR imaging of the cervical spine was performed. No intravenous contrast was administered. COMPARISON:  None Available. FINDINGS: Alignment: Straightening of the normal cervical lordosis. Trace retrolisthesis of C5 on C6. Vertebrae: Degenerative endplate changes at multiple levels with mild discogenic edema at C5-6. Vertebral body heights are  maintained. No evidence of acute fracture. Small hemangioma in the T1 vertebral body. Cord: Normal signal and morphology. Posterior Fossa, vertebral arteries, paraspinal tissues: Paraspinal musculature is unremarkable. Visualized posterior fossa is unremarkable. Abnormal appearance of the right vertebral artery flow void at the V2 segment which could be related to atherosclerosis. Retropharyngeal course of the carotid arteries which indents the posterior wall of the oropharynx. Disc levels: C2-3: Increased T2/stir signal in the disc likely reflecting degenerative changes. No significant disc bulge. No significant spinal canal stenosis. Bilateral facet arthrosis. No significant foraminal stenosis. C3-4: Mild disc height loss. Disc osteophyte complex which indents the ventral thecal sac without contacting the spinal cord. Bilateral facet arthrosis and uncovertebral hypertrophy. Moderate right and mild-to-moderate left foraminal stenosis. C4-5: Disc osteophyte complex indents the ventral thecal sac without contacting the spinal cord. Bilateral facet arthrosis. Uncovertebral hypertrophy greater on the left. Mild right and moderate left foraminal stenosis. C5-6: Mild disc height loss. Disc osteophyte complex indents the ventral thecal sac with mild flattening of the ventral cervical cord. Thickening of the ligamentum flavum. There is moderate spinal canal stenosis. No cord signal abnormality. Facet arthrosis and uncovertebral hypertrophy. Mild right and moderate left foraminal stenosis. C6-7: Disc bulge indents the ventral thecal sac without contacting the spinal cord. Bilateral facet arthrosis. No significant foraminal stenosis. C7-T1: No significant spinal canal stenosis. No significant foraminal stenosis. IMPRESSION: 1. Cervical spondylosis most significant at C5-C6 where there is moderate spinal canal stenosis and mild flattening of the ventral cord. No cord signal abnormality. 2. Moderate foraminal stenosis at  multiple levels as above. 3. Abnormal appearance of the right vertebral artery V2 segment which could be related to atherosclerosis/stenosis. Consider CTA for further evaluation. Electronically Signed   By: Emily Filbert M.D.   On: 04/13/2023 09:03   MR BRAIN WO CONTRAST Result Date: 04/13/2023 CLINICAL DATA:  Neuro deficit, acute, stroke suspected. Left upper and lower extremity weakness. EXAM: MRI HEAD WITHOUT CONTRAST TECHNIQUE: Multiplanar, multiecho pulse sequences of the brain and surrounding structures were obtained without intravenous contrast. COMPARISON:  Head CT and CTA 04/12/2023 and MRI 11/24/2022 FINDINGS: Brain: Patchy acute cortical and subcortical infarcts are present in the right frontal greater than right parietal lobes, including involvement of the precentral gyrus. A few punctate acute cortical infarcts are also present in the right occipital lobe. Patchy T2 hyperintensities elsewhere in the cerebral white matter bilaterally and in the pons are similar to the prior MRI and are nonspecific but compatible with moderate chronic small vessel ischemic disease. Chronic lacunar infarcts are again noted in the basal ganglia and deep cerebral white matter bilaterally. Chronic bilateral PCA infarcts are again noted  with associated chronic blood products. No age advanced global cerebral atrophy is evident. No mass, midline shift, hydrocephalus, or extra-axial fluid collection is identified. Vascular: Major intracranial vascular flow voids are preserved. Skull and upper cervical spine: Unremarkable bone marrow signal. Sinuses/Orbits: Unremarkable orbits. Paranasal sinuses and mastoid air cells are clear. Other: None. IMPRESSION: 1. Patchy acute right frontoparietal infarcts (MCA territory). 2. Punctate acute right occipital infarcts. 3. Moderate chronic small vessel ischemic disease. Chronic bilateral PCA infarcts. Electronically Signed   By: Sebastian Ache M.D.   On: 04/13/2023 08:11   CT Head Wo  Contrast Result Date: 04/12/2023 CLINICAL DATA:  Initial evaluation for neuro deficit, stroke suspected. EXAM: CT ANGIOGRAPHY HEAD AND NECK WITH AND WITHOUT CONTRAST TECHNIQUE: Multidetector CT imaging of the head and neck was performed using the standard protocol during bolus administration of intravenous contrast. Multiplanar CT image reconstructions and MIPs were obtained to evaluate the vascular anatomy. Carotid stenosis measurements (when applicable) are obtained utilizing NASCET criteria, using the distal internal carotid diameter as the denominator. RADIATION DOSE REDUCTION: This exam was performed according to the departmental dose-optimization program which includes automated exposure control, adjustment of the mA and/or kV according to patient size and/or use of iterative reconstruction technique. CONTRAST:  75mL OMNIPAQUE IOHEXOL 350 MG/ML SOLN COMPARISON:  Prior study from 11/24/2022 FINDINGS: CT HEAD FINDINGS Brain: Cerebral volume within normal limits. Sequelae of chronic microvascular ischemic disease with a few small remote lacunar infarcts about the corona radiata/basal ganglia. Chronic bilateral PCA distribution infarcts noted. No acute intracranial hemorrhage. No visible acute cortically based infarct. No mass lesion or midline shift. No hydrocephalus or extra-axial fluid collection. Vascular: No abnormal hyperdense vessel. Skull: Scalp soft tissues within normal limits.  Calvarium intact. Sinuses/Orbits: Globes and orbital soft tissues within normal limits. Paranasal sinuses and mastoid air cells are largely clear. Other: None. Review of the MIP images confirms the above findings CTA NECK FINDINGS Aortic arch: Mild aortic atherosclerosis. Standard branching. Imaged portion shows no evidence of aneurysm or dissection. No significant stenosis of the major arch vessel origins. Right carotid system: Right common and internal carotid arteries are patent without dissection. Mild atheromatous change  about the right carotid bulb without hemodynamically significant greater than 50% stenosis. Left carotid system: Left common and internal carotid arteries are patent without dissection. Mild-to-moderate atheromatous change about the left carotid bulb without hemodynamically significant greater than 50% stenosis. Vertebral arteries: Both vertebral arteries arise from the subclavian arteries. Left vertebral artery strongly dominant and patent without stenosis or dissection. 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 Irregular attenuated intermittent opacification through the right V2 segment. Right vertebral artery is patent at the right V3 segment with subsequent wide patency as it courses into the cranial vault. Appearance is relatively similar to prior. Skeleton: No discrete or worrisome osseous lesions. Mild to moderate cervical spondylosis, most pronounced at C5-6. Scattered dental caries noted. Other neck: No other acute finding. Upper chest: No other acute finding. Review of the MIP images confirms the above findings CTA HEAD FINDINGS Anterior circulation: Calcified atherosclerosis present about the carotid siphons bilaterally with estimated 50-60% stenosis bilaterally, similar to prior. A1 segments patent bilaterally, with the left being dominant. Normal anterior communicating artery complex. Atheromatous irregularity throughout the ACAs without high-grade stenosis. Atheromatous irregularity about the M1 segments without high-grade stenosis. No proximal MCA branch occlusion. Distal small vessel atheromatous irregularity throughout the MCA branches bilaterally. Appearance is relatively similar. Posterior circulation: Hypoplastic right vertebral artery largely terminates in PICA.  Distal right V4 segment minimally opacified with subsequent occlusion prior to the vertebrobasilar junction. Atherosclerotic change about the dominant left V4 segment with moderate multifocal  stenoses, similar. Both PICA remain patent. Basilar is somewhat diminutive and diffusely irregular without focal high-grade stenosis. Superior cerebral arteries patent bilaterally. Atherosclerotic change about both PCAs which are irregular and attenuated in appearance. Moderate to severe stenoses noted at the left P2 segment and proximal right P1/P2 segments. PCAs remain patent to their distal aspects. Appearance is similar. Venous sinuses: Patent allowing for timing the contrast bolus. Anatomic variants: As above.  No aneurysm. Review of the MIP images confirms the above findings IMPRESSION: CT HEAD: 1. No acute intracranial abnormality. 2. Chronic bilateral PCA distribution infarcts. 3. Underlying chronic microvascular ischemic disease with a few remote lacunar infarcts about the bilateral basal ganglia/corona radiata. CTA HEAD AND NECK: 1. Negative CTA for large vessel occlusion or other emergent finding. 2. Diffuse intracranial atherosclerotic disease as detailed above, overall similar as compared to prior CTA from 03/26/2022. 3. 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 Irregular intermittent opacification within the neck, but patent at the skull base, likely from collateral flow. Right vertebral artery functionally terminates in PICA. Moderate multifocal stenoses about the contralateral dominant left V4 segment. 4.  Aortic Atherosclerosis (ICD10-I70.0). Electronically Signed   By: Rise Mu M.D.   On: 04/12/2023 20:45   CT ANGIO HEAD NECK W WO CM Result Date: 04/12/2023 CLINICAL DATA:  Initial evaluation for neuro deficit, stroke suspected. EXAM: CT ANGIOGRAPHY HEAD AND NECK WITH AND WITHOUT CONTRAST TECHNIQUE: Multidetector CT imaging of the head and neck was performed using the standard protocol during bolus administration of intravenous contrast. Multiplanar CT image reconstructions and MIPs were obtained to evaluate the vascular anatomy. Carotid stenosis measurements (when  applicable) are obtained utilizing NASCET criteria, using the distal internal carotid diameter as the denominator. RADIATION DOSE REDUCTION: This exam was performed according to the departmental dose-optimization program which includes automated exposure control, adjustment of the mA and/or kV according to patient size and/or use of iterative reconstruction technique. CONTRAST:  75mL OMNIPAQUE IOHEXOL 350 MG/ML SOLN COMPARISON:  Prior study from 11/24/2022 FINDINGS: CT HEAD FINDINGS Brain: Cerebral volume within normal limits. Sequelae of chronic microvascular ischemic disease with a few small remote lacunar infarcts about the corona radiata/basal ganglia. Chronic bilateral PCA distribution infarcts noted. No acute intracranial hemorrhage. No visible acute cortically based infarct. No mass lesion or midline shift. No hydrocephalus or extra-axial fluid collection. Vascular: No abnormal hyperdense vessel. Skull: Scalp soft tissues within normal limits.  Calvarium intact. Sinuses/Orbits: Globes and orbital soft tissues within normal limits. Paranasal sinuses and mastoid air cells are largely clear. Other: None. Review of the MIP images confirms the above findings CTA NECK FINDINGS Aortic arch: Mild aortic atherosclerosis. Standard branching. Imaged portion shows no evidence of aneurysm or dissection. No significant stenosis of the major arch vessel origins. Right carotid system: Right common and internal carotid arteries are patent without dissection. Mild atheromatous change about the right carotid bulb without hemodynamically significant greater than 50% stenosis. Left carotid system: Left common and internal carotid arteries are patent without dissection. Mild-to-moderate atheromatous change about the left carotid bulb without hemodynamically significant greater than 50% stenosis. Vertebral arteries: Both vertebral arteries arise from the subclavian arteries. Left vertebral artery strongly dominant and patent  without stenosis or dissection. 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 Irregular attenuated intermittent opacification through the right V2 segment. Right vertebral artery is patent  at the right V3 segment with subsequent wide patency as it courses into the cranial vault. Appearance is relatively similar to prior. Skeleton: No discrete or worrisome osseous lesions. Mild to moderate cervical spondylosis, most pronounced at C5-6. Scattered dental caries noted. Other neck: No other acute finding. Upper chest: No other acute finding. Review of the MIP images confirms the above findings CTA HEAD FINDINGS Anterior circulation: Calcified atherosclerosis present about the carotid siphons bilaterally with estimated 50-60% stenosis bilaterally, similar to prior. A1 segments patent bilaterally, with the left being dominant. Normal anterior communicating artery complex. Atheromatous irregularity throughout the ACAs without high-grade stenosis. Atheromatous irregularity about the M1 segments without high-grade stenosis. No proximal MCA branch occlusion. Distal small vessel atheromatous irregularity throughout the MCA branches bilaterally. Appearance is relatively similar. Posterior circulation: Hypoplastic right vertebral artery largely terminates in PICA. Distal right V4 segment minimally opacified with subsequent occlusion prior to the vertebrobasilar junction. Atherosclerotic change about the dominant left V4 segment with moderate multifocal stenoses, similar. Both PICA remain patent. Basilar is somewhat diminutive and diffusely irregular without focal high-grade stenosis. Superior cerebral arteries patent bilaterally. Atherosclerotic change about both PCAs which are irregular and attenuated in appearance. Moderate to severe stenoses noted at the left P2 segment and proximal right P1/P2 segments. PCAs remain patent to their distal aspects. Appearance is similar. Venous sinuses: Patent  allowing for timing the contrast bolus. Anatomic variants: As above.  No aneurysm. Review of the MIP images confirms the above findings IMPRESSION: CT HEAD: 1. No acute intracranial abnormality. 2. Chronic bilateral PCA distribution infarcts. 3. Underlying chronic microvascular ischemic disease with a few remote lacunar infarcts about the bilateral basal ganglia/corona radiata. CTA HEAD AND NECK: 1. Negative CTA for large vessel occlusion or other emergent finding. 2. Diffuse intracranial atherosclerotic disease as detailed above, overall similar as compared to prior CTA from 03/26/2022. 3. 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 Irregular intermittent opacification within the neck, but patent at the skull base, likely from collateral flow. Right vertebral artery functionally terminates in PICA. Moderate multifocal stenoses about the contralateral dominant left V4 segment. 4.  Aortic Atherosclerosis (ICD10-I70.0). Electronically Signed   By: Rise Mu M.D.   On: 04/12/2023 20:45     PHYSICAL EXAM  Temp:  [97.5 F (36.4 C)-98.1 F (36.7 C)] 98 F (36.7 C) (02/27 0841) Pulse Rate:  [62-76] 62 (02/27 0841) Resp:  [14-21] 16 (02/27 0841) BP: (142-199)/(61-124) 179/62 (02/27 0841) SpO2:  [99 %-100 %] 99 % (02/27 0841)  General - Well nourished, well developed, in no apparent distress.  Ophthalmologic - fundi not visualized due to noncooperation.  Cardiovascular - Regular rhythm and rate.  Neuro - awake, alert, eyes open, orientated to age, place, time. No aphasia, fluent language, following all simple commands. Able to name and repeat. No gaze palsy, left mild ptosis, tracking bilaterally, visual field test showed b/l upper quadrantanopia, PERRL. No significant facial droop. Tongue midline. RUE 5/5 and LUE 4/5 with some giveaway weakness, BLE proximal 4/5 and distally R foot DF 2/5 and PF 3/5 (chronic per pt) but L foot DF and PF 4/5. Decreased RLE sensation (chronic due  to neuropathy per pt) and decreased LUE and L facial light touch sensation. FTN slow b/l but no obvious ataxia. Gait not tested.    ASSESSMENT/PLAN Tami Jones is a 68 y.o. female with history of diabetes, hypertension, hyperlipidemia, anxiety, CKD 3, strokes admitted for left-sided weakness, left facial droop. No TNK given due to outside window.  Stroke:  right MCA, MCA/PCA and MCA/ACA cortical and subcortical infarcts, embolic pattern concerning for cardioembolic source vs. R MCA stenosis CT no acute abnormality, chronic bilateral PCA distribution infarcts. MRI patchy acute right frontal parietal infarcts, and punctate acute right occipital infarct.  Chronic bilateral PCA infarcts. CT head and neck bilateral ICA bulb atherosclerosis, left more than right.  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  Left V4 moderate stenosis, bilateral ICA siphon stenosis left more than right, right M1 moderate to severe stenosis, left P2, right P1/P2 moderate severe stenosis. 2D Echo EF 60 to 65% LE venous Doppler no DVT Consider loop recorder before discharge LDL 134 HgbA1c 6.8 UDS negative Lovenox for VTE prophylaxis aspirin 81 mg daily and clopidogrel 75 mg daily prior to admission, now on aspirin 81 mg daily and clopidogrel 75 mg daily.  Patient counseled to be compliant with her antithrombotic medications Ongoing aggressive stroke risk factor management Therapy recommendations: Home health PT Disposition: Pending  History of stroke 2005 stroke with decreased peripheral vision - ?  Occipital infarcts 06/2014 admitted for tunnel vision, nausea.  BP 120s.  MRI negative for stroke 07/2018 right upper extremity weakness and numbness, MRI showed left CR lacunar infarct, discharged on aspirin 325.  Residual right-sided weakness. 05/2019 admitted for slurred speech and right facial droop.  MRI left MCA 3 scattered infarcts.  Carotid Doppler unremarkable. 08/2019 admitted for altered mental status and  generalized weakness.  MRI no acute stroke.  CTA showed right P1/P2, left P2, right A1 severe stenosis 03/2022 admitted for hypertensive urgency, dysphasia.  CT old bilateral occipital infarcts.  MRI no acute infarct, old bilateral PCA infarcts.  CT head and neck right A1, left P2, right P1/P2 severe stenosis.  EF 55 to 60%.  Discharged on DAPT and Lipitor.  Palpitation  Admitted intermittent palpitation Currently at night heart palpitation for 5 to 10 minutes in bed Consider loop recorder before discharge  Diabetes HgbA1c 6.8 goal < 7.0 Controlled CBG monitoring SSI DM education and close PCP follow up  Hypertension Stable on the high end On home meds metoprolol XL 25 and 2010 Long term BP goal normotensive  Hyperlipidemia Home meds: Lipitor 80 LDL 134, goal < 70 Now on Lipitor 80, add Zetia 10 Will consider leqvio Continue statin at discharge  Other Stroke Risk Factors Advanced age Obesity, Body mass index is 37.73 kg/m.   Other Active Problems Anxiety CKD 3A, creatinine 1.40--1.19  Hospital day # 1  I discussed with Dr. Mahala Menghini. I spent extensive face-to-face time with the patient, more than 50% of which was spent in counseling and coordination of care, reviewing test results, images and medication, and discussing the diagnosis, treatment plan and potential prognosis. This patient's care requiresreview of multiple databases, neurological assessment, discussion with family, other specialists and medical decision making of high complexity.    Marvel Plan, MD PhD Stroke Neurology 04/14/2023 11:49 AM    To contact Stroke Continuity provider, please refer to [email or web address]. After hours, contact General Neurology

## 2023-04-14 NOTE — TOC Benefit Eligibility Note (Signed)
 Pharmacy Patient Advocate Encounter  Insurance verification completed.    The patient is insured through  [employer] . Patient has Medicare and is not eligible for a copay card, but may be able to apply for patient assistance or Medicare RX Payment Plan (Patient Must reach out to their plan, if eligible for payment plan), if available.    Ran test claim for Brilinta 90mg  and the current 30 day co-pay is $302 (includes a deductible).   This test claim was processed through [facility]- copay amounts may vary at other pharmacies due to pharmacy/plan contracts, or as the patient moves through the different stages of their insurance plan.

## 2023-04-14 NOTE — Progress Notes (Signed)
 TRH ROUNDING  NOTE Tami Jones ZOX:[id number]  DOB: 03-18-1955  DOA: 04/12/2023  PCP: Tillman Abide I, MD  04/14/2023,8:12 AM  LOS: 1 day   Code Status: full  From:  home   Current Dispo: home?   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  Neuropathy 2/25 called OV woke up with right-sided residual deficits pulling sensation left side of face and neck--present [place] Noticed she was wobbly while walking running into walls persistently weak etc.--- also reported 2 falls in the last week where striking her head CT head negative for large vessel occlusion emergent finding no acute intracranial abnormality chronic bilateral PCA distribution infarcts underlying chronic microvascular ischemic disease with few remote lacunar infarctions-hypoplastic right vertebral artery occluded at origin MRI brain patchy acute right frontoparietal infarcts punctate acute right occipital infarct moderate chronic small vessel ischemic disease MRI cervical spine cervical spondylosis C5-C6 with moderate canal stenosis Sodium 142 potassium 3.7 BUN/creatinine 20/1.4 LFTs normal WBC 7.2 hemoglobin 13.9 platelet 236 UA   alcohol and drug screen negative, HIV nonreactive LDL 134 total cholesterol 811  Plan  Acute right frontoparietal infarcts, acute right occipital infarct Previous multiple CVA/TIA as above Permissive hypertension, need Zio patch rule out occult causes as some underlying palpitations described at night Atorvastatin 80, Plavix 75 aspirin 81 and then likely discontinue Plavix after 3/18 May be a candidate for PCK 1 inhibitor as outpatient dependent on affordability Complete workup but likely can discharge home subsequent to workup Uncontrolled hypertension Permissive hypertension as above-intolerant previously to various meds as per office  notes-lisinopril held Continues on amlodipine 10, Toprol-XL 25 DM TY 2-await A1c Resume Glucotrol 5 twice daily, continue sliding scale for now  CBG overall below 140 May need to readd in the outpatient setting gabapentin 300 3 times daily-was not taking at home Underlying anxiety Continue Xanax 0.125 at bedtime as needed  DVT prophylaxis: Lovenox  Status is: Inpatient Remains inpatient appropriate because:   Requires completion of work-up   Subjective: Awake coherent anxious--wonders "why do I keep having stroke" Concerned about ability to take care of self  Objective + exam Vitals:   04/13/23 2033 04/13/23 2048 04/13/23 2352 04/14/23 0353  BP: (!) 142/61 (!) 167/88 (!) 154/76 (!) 162/80  Pulse: 76 70 63 71  Resp: 18 18 16 16   Temp: 97.7 F (36.5 C) 98 F (36.7 C) (!) 97.5 F (36.4 C) 97.7 F (36.5 C)  TempSrc: Oral Oral Oral Oral  SpO2: 100% 100% 100% 99%  Weight:      Height:       Filed Weights   04/13/23 0532  Weight: 96.6 kg    Examination: Alert coherent no droop, no dysarthria Power 5/5 RUE and RLE LUE grade 4/5 power, diminished power to LLE Reflexes 2/3 grossly Speech clear Smile symm Vision and coordiunation not testes--patient seen  ambulating earleir with therapy, moving around comfortably with walker  Data Reviewed: reviewed   CBC    Component Value Date/Time   WBC 5.1 04/14/2023 0626   RBC 4.34 04/14/2023 0626   HGB 13.5 04/14/2023 0626   HGB 14.1 11/27/2019 0920   HCT 39.7 04/14/2023 0626   HCT 41.3 11/27/2019 0920   PLT 209 04/14/2023 0626   PLT 258 11/27/2019 0920   MCV 91.5 04/14/2023 0626   MCV 92 11/27/2019 0920   MCV 92 03/04/2013  1849   MCH 31.1 04/14/2023 0626   MCHC 34.0 04/14/2023 0626   RDW 12.7 04/14/2023 0626   RDW 12.4 11/27/2019 0920   RDW 12.5 03/04/2013 1849   LYMPHSABS 2.3 04/12/2023 1731   LYMPHSABS 1.8 02/17/2017 1300   MONOABS 0.5 04/12/2023 1731   EOSABS 0.0 04/12/2023 1731   EOSABS 0.1 02/17/2017 1300    BASOSABS 0.0 04/12/2023 1731   BASOSABS 0.0 02/17/2017 1300      Latest Ref Rng & Units 04/14/2023    6:26 AM 04/12/2023    6:28 PM 04/12/2023    5:31 PM  CMP  Glucose 70 - 99 mg/dL 91  161  096   BUN 8 - 23 mg/dL 13  22  20    Creatinine 0.44 - 1.00 mg/dL 0.45  4.09  8.11   Sodium 135 - 145 mmol/L 141  142  142   Potassium 3.5 - 5.1 mmol/L 3.9  3.6  3.7   Chloride 98 - 111 mmol/L 107  108  107   CO2 22 - 32 mmol/L 25   22   Calcium 8.9 - 10.3 mg/dL 9.1   9.0   Total Protein 6.5 - 8.1 g/dL   6.4   Total Bilirubin 0.0 - 1.2 mg/dL   0.6   Alkaline Phos 38 - 126 U/L   61   AST 15 - 41 U/L   24   ALT 0 - 44 U/L   23     Scheduled Meds:   stroke: early stages of recovery book   Does not apply Once   amLODipine  10 mg Oral QPM   aspirin EC  81 mg Oral QPM   atorvastatin  80 mg Oral QPM   clopidogrel  75 mg Oral Daily   enoxaparin (LOVENOX) injection  40 mg Subcutaneous Q24H   glipiZIDE  5 mg Oral BID   insulin aspart  0-15 Units Subcutaneous TID WC   metoprolol succinate  25 mg Oral Daily   pantoprazole  40 mg Oral Daily   Continuous Infusions:  Time  44  Rhetta Mura, MD  [facility]

## 2023-04-14 NOTE — TOC Initial Note (Signed)
 Transition of Care [facility]) - Initial/Assessment Note    Patient Details  Name: Tami Jones MRN: [id number] Date of Birth: 1955/09/21  Transition of Care [facility]) CM/SW Contact:    Kermit Balo, RN Phone Number: 04/14/2023, 3:56 PM  Clinical Narrative:                  Pt is from home with her daughter and son in law. She has supervision in evenings and at night.  She was driving self but worries about transportation after the hospital. CM encouraged her to call her [facility] and see if she has the transportation benefits. [employer] may be able to provide some transportation to appointments.  She manages her own medications and denies any issues.  Home health arranged with [facility]. Frances Furbish will contact her for the first home visit. Information on the AVS.  Pt states her daughter will provide transportation home when medically ready.  Expected Discharge Plan: Home w Home Health Services Barriers to Discharge: Continued Medical Work up   Patient Goals and CMS Choice   CMS Medicare.gov Compare Post Acute Care list provided to:: Patient Choice offered to / list presented to : Patient      Expected Discharge Plan and Services   Discharge Planning Services: CM Consult Post Acute Care Choice: Home Health Living arrangements for the past 2 months: Single Family Home                           HH Arranged: PT, OT HH Agency: [facility] Date [facility] Agency Contacted: 04/14/23   Representative spoke with at [facility] Agency: Kandee Keen  Prior Living Arrangements/Services Living arrangements for the past 2 months: Single Family Home Lives with:: Adult Children Patient language and need for interpreter reviewed:: Yes Do you feel safe going back to the place where you live?: Yes        Care giver support system in place?: No (comment) Current home services: DME ([facility]/ walker/ cane) Criminal Activity/Legal Involvement Pertinent to Current Situation/Hospitalization: No - Comment as  needed  Activities of Daily Living   ADL Screening (condition at time of admission) Independently performs ADLs?: Yes (appropriate for developmental age) Is the patient deaf or have difficulty hearing?: No Does the patient have difficulty seeing, even when wearing glasses/contacts?: No Does the patient have difficulty concentrating, remembering, or making decisions?: No  Permission Sought/Granted                  Emotional Assessment Appearance:: Appears stated age Attitude/Demeanor/Rapport: Engaged Affect (typically observed): Accepting Orientation: : Oriented to Self, Oriented to Place, Oriented to  Time, Oriented to Situation   Psych Involvement: No (comment)  Admission diagnosis:  CVA (cerebrovascular accident) [facility]) [I63.9] Cerebrovascular accident (CVA), unspecified mechanism (HCC) [I63.9] Patient Active Problem List   Diagnosis Date Noted   CVA (cerebrovascular accident) (HCC) 04/13/2023   Radiculopathy of leg 01/25/2023   Foot pain, right 01/17/2023   Paresthesia 11/25/2022   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   TIA (transient ischemic attack) 03/25/2022   Asthma, chronic, unspecified asthma severity, with acute exacerbation 03/25/2022   RUQ pain 12/28/2021   Morbid obesity (HCC) 12/28/2021   Seizure disorder (HCC) 12/28/2021   Urinary frequency 11/27/2019   History of CVA with residual deficit 08/25/2019   Anxiety    Dyslipidemia    CKD (chronic kidney disease), stage IIIa    Neuropathic pain, arm 10/26/2018   Hemiparesis of right dominant side (HCC)  08/17/2018   Dental infection 08/07/2014   Acute renal failure superimposed on stage 3a chronic kidney disease (HCC) 07/17/2014   Cerebrovascular disease, arteriosclerotic, post-stroke 07/17/2014   Preventative health care 06/25/2014   Right sided sciatica 03/08/2013   MENOPAUSAL SYNDROME 09/10/2008   Mood disorder (HCC) 09/15/2007   Essential hypertension, benign 08/10/2006   Type 2 diabetes mellitus  with other circulatory complications (HCC) 06/17/2006   HYPERCHOLESTEROLEMIA 06/17/2006   GERD 06/17/2006   PCP:  Karie Schwalbe, MD Pharmacy:   [facility] [address], [place] - [address] [place] [address] Phone: [phone] Fax: [phone]     Social Drivers of Health (SDOH) Social History: SDOH Screenings   Food Insecurity: No Food Insecurity (04/14/2023)  Housing: Low Risk  (04/14/2023)  Transportation Needs: No Transportation Needs (04/14/2023)  Utilities: Not At Risk (04/14/2023)  Alcohol Screen: Low Risk  (05/05/2022)  Depression (PHQ2-9): Medium Risk (01/17/2023)  Financial Resource Strain: High Risk (05/05/2022)  Physical Activity: Insufficiently Active (05/05/2022)  Social Connections: Socially Isolated (04/14/2023)  Stress: Stress Concern Present (05/05/2022)  Tobacco Use: Low Risk  (04/12/2023)   SDOH Interventions:     Readmission Risk Interventions     No data to display

## 2023-04-14 NOTE — Evaluation (Signed)
 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   TIA (transient ischemic attack) 03/25/2022   Asthma, chronic, unspecified asthma severity, with acute exacerbation 03/25/2022   RUQ pain 12/28/2021   Morbid obesity (HCC) 12/28/2021   Seizure disorder (HCC) 12/28/2021   Urinary frequency 11/27/2019   History of CVA with residual deficit 08/25/2019   Anxiety    Dyslipidemia    CKD (chronic kidney disease), stage IIIa    Neuropathic pain, arm 10/26/2018   Hemiparesis of right dominant side (HCC) 08/17/2018   Dental infection 08/07/2014   Acute renal failure superimposed on stage 3a chronic kidney disease (HCC) 07/17/2014   Cerebrovascular disease, arteriosclerotic, post-stroke 07/17/2014   Preventative health care 06/25/2014   Right sided sciatica 03/08/2013   MENOPAUSAL SYNDROME 09/10/2008   Mood disorder (HCC) 09/15/2007   Essential hypertension, benign 08/10/2006   Type 2 diabetes mellitus with other circulatory complications (HCC) 06/17/2006   HYPERCHOLESTEROLEMIA 06/17/2006   GERD 06/17/2006   Past Medical History:  Past Medical History:  Diagnosis Date   Anxiety    Asthma    CVA (cerebral vascular accident) (HCC) 03/2006   right occipital, Dr. Thad Ranger   Diabetes mellitus    Expressive dysphasia 03/25/2022   GERD (gastroesophageal reflux disease)    Hyperlipidemia    Hypertension    Hypertensive urgency 03/25/2022   Past Surgical History:  Past Surgical History:  Procedure Laterality Date   CARDIOVASCULAR STRESS TEST  1/15   myoview. EF 60%   CESAREAN SECTION      ESOPHAGOGASTRODUODENOSCOPY  05/2005   TONSILLECTOMY AND ADENOIDECTOMY     HPI:  MIRI JOSE is a 68 y.o. female with medical history significant for CVA with residual right-sided weakness, T2DM, HTN, HLD, anxiety and depression and GERD who presented to the ED for evaluation of left-sided weakness on 04/13/23. Patient reported being wobbly on her feet and occasionally dragging her left leg and running into walls. [facility] a small weight that she is usually able to lift. She also reported having numbness and tingling of her fingers of her left hand. She had trouble walking due to left leg weakness so she presented to the ED for evaluation; MRI 2/26 revealed  Patchy acute right frontoparietal infarcts (MCA territory).  2. Punctate acute right occipital infarcts.  3. Moderate chronic small vessel ischemic disease. Chronic bilateral  PCA infarcts.  ST consulted for speech/language cognitive assessment.   Assessment / Plan / Recommendation Clinical Impression  Pt seen for speech/language cognitive assessment with [employer] Mental Status Examination (SLUMS) with a score of 28/30 obtained which falls within normal range for this assessment.  Pt with prior CVAs and deficits including memory recall and anomia intermittently within conversation which she feels may have been exacerbated by this CVA.  Attention tasks were within normal limits and paragraph recall accurate.  Delayed recall of objects 80% accurate independently, but she was able to recall all with a category cue given by SLP.  OME revealed slight sensory impairment on L facial area, but otherwise WFL. Speech noted to be intelligible within complex conversation.  Discussed  f/u x1 in hospital for memory recall strategies/education by ST and/or f/u with [facility] SLP prn once d/c.  Thank you for this consult.    SLP Assessment  SLP Recommendation/Assessment: Patient needs continued Speech Language Pathology  Services SLP Visit Diagnosis: Aphasia (R47.01);Cognitive communication deficit (R41.841)    Recommendations for follow up therapy are one component of a multi-disciplinary discharge planning process, led by the attending physician.  Recommendations may be updated based on patient status, additional functional criteria and insurance authorization.    Follow Up Recommendations  Follow physician's recommendations for discharge plan and follow up therapies    Assistance Recommended at Discharge  Other (comment) (TBD)  Functional Status Assessment Patient has had a recent decline in their functional status and demonstrates the ability to make significant improvements in function in a reasonable and predictable amount of time.  Frequency and Duration min 1 x/week  1 week      SLP Evaluation Cognition  Overall Cognitive Status: No family/caregiver present to determine baseline cognitive functioning Arousal/Alertness: Awake/alert Orientation Level: Oriented X4 Attention: Sustained Sustained Attention: Appears intact Memory: Impaired Memory Impairment: Retrieval deficit;Decreased recall of new information;Decreased short term memory Decreased Short Term Memory: Verbal basic;Functional basic Awareness: Appears intact Behaviors: Perseveration Safety/Judgment:  (May be impacted by memory deficit)       Comprehension  Auditory Comprehension Overall Auditory Comprehension: Appears within functional limits for tasks assessed Conversation: Complex Interfering Components: Working memory EffectiveTechniques: Repetition IT trainer Discrimination: Exceptions to [facility] Other Visual Recogniton/Discrimination Comments: decreased acuity Reading Comprehension Reading Status: Impaired Functional Environmental (signs, name badge): Impaired Interfering Components: Visual acuity;Visual perceptual Effective Techniques: Verbal cueing    Expression Expression Primary Mode of  Expression: Verbal Verbal Expression Overall Verbal Expression: Impaired Level of Generative/Spontaneous Verbalization: Conversation Naming: Impairment Responsive: 76-100% accurate Confrontation: Within functional limits Convergent: 50-74% accurate Divergent: 50-74% accurate Verbal Errors: Perseveration;Aware of errors Interfering Components: Premorbid deficit Non-Verbal Means of Communication: Not applicable Written Expression Dominant Hand: Right Written Expression: Exceptions to [facility] Interfering Components: Other (comment) (visual acuity)   Oral / Motor  Oral Motor/Sensory Function Overall Oral Motor/Sensory Function: Mild impairment Facial ROM: Within Functional Limits Facial Symmetry: Within Functional Limits Facial Strength: Within Functional Limits Facial Sensation: Reduced left Lingual ROM: Within Functional Limits Lingual Symmetry: Within Functional Limits Lingual Strength: Within Functional Limits Lingual Sensation: Within Functional Limits Velum: Within Functional Limits Mandible: Other (Comment) (L numbness per report) Motor Speech Overall Motor Speech: Appears within functional limits for tasks assessed Respiration: Within functional limits Phonation: Normal Resonance: Within functional limits Articulation: Within functional limitis Intelligibility: Intelligible Motor Planning: Witnin functional limits Motor Speech Errors: Not applicable Interfering Components: Premorbid status            Pat Iasha Mccalister,M.S.,CCC-SLP 04/14/2023, 1:01 PM

## 2023-04-14 NOTE — Plan of Care (Signed)
  Problem: Ischemic Stroke/TIA Tissue Perfusion: Goal: Complications of ischemic stroke/TIA will be minimized 04/14/2023 0506 by Laurence Aly, RN Outcome: Progressing 04/14/2023 0236 by Laurence Aly, RN Outcome: Progressing

## 2023-04-14 NOTE — Plan of Care (Signed)
 Has had many questions and misconceptions about stroke.  Tami Jones that she thought because she already had a stroke that she would not or could not have another.  Had many questions and this nurse did have a question/answer session about stroke with her.    Problem: Education: Goal: Knowledge of secondary prevention will improve (MUST DOCUMENT ALL) Outcome: Progressing   Problem: Education: Goal: Knowledge of patient specific risk factors will improve (DELETE if not current risk factor) Outcome: Progressing   Problem: Ischemic Stroke/TIA Tissue Perfusion: Goal: Complications of ischemic stroke/TIA will be minimized Outcome: Progressing   Problem: Coping: Goal: Will verbalize positive feelings about self Outcome: Progressing   Problem: Coping: Goal: Ability to adjust to condition or change in health will improve Outcome: Progressing

## 2023-04-14 NOTE — Evaluation (Signed)
 Physical Therapy Evaluation Patient Details Name: Tami Jones MRN: [id number] DOB: Feb 29, 1955 Today's Date: 04/14/2023  History of Present Illness  Pt is a 68 yo female presenting to [facility]/26 for evaluation of L sided weakness. MRI revealed acute R MCA infarcts and punctate R occipital infarcts. PMH of CVA with R sided weakness, DM II, HTN, HLD, anxiety, depression and GERD.  Clinical Impression  PTA pt living with daughter and grandkids in single story home with 3 steps to enter. Pt reports ambulation with cane, and independence with ADLs, family support for iADLs. Pt reports driving to pick grandkids up from school every afternoon.Pt is limited in safe mobility by B LE weakness, R>L, decreased sensation to R LE from shin to foot, decreased balance and endurance, as well as poor safety awareness in terms of knowledge of her deficits.  Pt currently requires min A for bed mobility, transfers and short distance ambulation with RW.  PT recommending HHPT at discharge to regain independence with her ambulation and ADLs. PT will continue to follow acutely.        If plan is discharge home, recommend the following: A little help with walking and/or transfers;A little help with bathing/dressing/bathroom;Assistance with cooking/housework;Assist for transportation;Help with stairs or ramp for entrance   Can travel by private vehicle        Equipment Recommendations None recommended by PT  Recommendations for Other Services       Functional Status Assessment Patient has had a recent decline in their functional status and demonstrates the ability to make significant improvements in function in a reasonable and predictable amount of time.     Precautions / Restrictions Precautions Precautions: Fall Precaution/Restrictions Comments: reports fall last week over laundry on floor Restrictions Weight Bearing Restrictions Per Provider Order: No      Mobility  Bed Mobility Overal bed mobility:  Needs Assistance Bed Mobility: Supine to Sit     Supine to sit: Min assist, Used rails, HOB elevated     General bed mobility comments: pt slides LE off bed but needs assist to bring trunk to upright, pulling against PT hand    Transfers Overall transfer level: Needs assistance Equipment used: Rolling walker (2 wheels) Transfers: Sit to/from Stand Sit to Stand: Min assist, Contact guard assist           General transfer comment: able to initiate power up but benefits from min A for steadying in standing, requires vc for hand placement to power up, contact guard for pt to pull up against grab bar in bathroom to pull herself into standing from low toilet    Ambulation/Gait Ambulation/Gait assistance: Min assist Gait Distance (Feet): 12 Feet (+18) Assistive device: Rolling walker (2 wheels) Gait Pattern/deviations: Step-through pattern, Shuffle, Trunk flexed, Wide base of support, Decreased dorsiflexion - right, Decreased weight shift to right Gait velocity: slowed Gait velocity interpretation: <1.31 ft/sec, indicative of household ambulator   General Gait Details: minA for shuffling gait, with increased lateral sway, vc for upright posture and proximity to RW        Balance Overall balance assessment: Needs assistance Sitting-balance support: Feet supported, Bilateral upper extremity supported, No upper extremity supported Sitting balance-Leahy Scale: Fair     Standing balance support: Bilateral upper extremity supported, Single extremity supported, During functional activity, Reliant on assistive device for balance Standing balance-Leahy Scale: Poor Standing balance comment: requires at least single UE support for dynamic balance  Pertinent Vitals/Pain Pain Assessment Pain Assessment: No/denies pain    Home Living Family/patient expects to be discharged to:: Private residence Living Arrangements: Children (works during  day) Available Help at Discharge: Family;Available PRN/intermittently Type of Home: House Home Access: Stairs to enter Entrance Stairs-Rails: Doctor, general practice of Steps: 3 from back (typical entrance) with L railing and use of SPC; 5 STE from front with B railings   Home Layout: One level Home Equipment: Grab bars - tub/shower;Grab bars - toilet;Cane - single point;Rolling Walker (2 wheels) Additional Comments: lives with daughter and grandchildren    Prior Function Prior Level of Function : Independent/Modified Independent             Mobility Comments: ambulates with cane, reports driving to pick up children from school, despite also reporting decreased sensation in R LE due to peripheral neuropathy ADLs Comments: daughter provides meals and iADLs, pt independent with ADLs     Extremity/Trunk Assessment   Upper Extremity Assessment Upper Extremity Assessment: Defer to OT evaluation    Lower Extremity Assessment Lower Extremity Assessment: RLE deficits/detail;LLE deficits/detail RLE Deficits / Details: pt unable to fully extend knee or dorsiflex actively, PROM WFL, hip strength 3+/5, knee ext2+/5, knee flex 3+/5, dorsiflexion 2+/5, plantarflex 3+/5 RLE Sensation: decreased light touch;history of peripheral neuropathy (mid thigh to foot) RLE Coordination: decreased fine motor LLE Deficits / Details: AROM WFl hip 3+/5, knee flex 3+/5, knee ext 3/5, dorsiflex 3/5, knee plantar flex 3+/5 LLE Sensation: decreased light touch LLE Coordination: decreased fine motor    Cervical / Trunk Assessment Cervical / Trunk Assessment: Kyphotic  Communication   Communication Communication: No apparent difficulties    Cognition Arousal: Alert Behavior During Therapy: Impulsive   PT - Cognitive impairments: Awareness, Problem solving, Safety/Judgement                       PT - Cognition Comments: poor safety awareness, poor awareness of deficits Following  commands: Impaired Following commands impaired: Follows one step commands inconsistently     Cueing Cueing Techniques: Verbal cues, Tactile cues, Visual cues     General Comments General comments (skin integrity, edema, etc.): VSS on RA        Assessment/Plan    PT Assessment Patient needs continued PT services  PT Problem List Decreased strength;Decreased range of motion;Decreased activity tolerance;Decreased balance;Decreased mobility;Decreased coordination;Decreased cognition;Decreased knowledge of use of DME;Decreased safety awareness;Impaired sensation       PT Treatment Interventions DME instruction;Gait training;Stair training;Functional mobility training;Therapeutic activities;Therapeutic exercise;Neuromuscular re-education;Cognitive remediation;Patient/family education    PT Goals (Current goals can be found in the Care Plan section)  Acute Rehab PT Goals PT Goal Formulation: With patient Time For Goal Achievement: 04/28/23 Potential to Achieve Goals: Fair    Frequency Min 1X/week        AM-PAC PT "6 Clicks" Mobility  Outcome Measure Help needed turning from your back to your side while in a flat bed without using bedrails?: None Help needed moving from lying on your back to sitting on the side of a flat bed without using bedrails?: A Little Help needed moving to and from a bed to a chair (including a wheelchair)?: A Little Help needed standing up from a chair using your arms (e.g., wheelchair or bedside chair)?: A Little Help needed to walk in hospital room?: A Little Help needed climbing 3-5 steps with a railing? : Total 6 Click Score: 17    End of Session Equipment Utilized During Treatment: Gait belt  Activity Tolerance: Patient tolerated treatment well (but would not tolerate much further) Patient left: in chair;with call bell/phone within reach;with chair alarm set Nurse Communication: Mobility status PT Visit Diagnosis: Unsteadiness on feet (R26.81);Other  abnormalities of gait and mobility (R26.89);History of falling (Z91.81);Muscle weakness (generalized) (M62.81);Other symptoms and signs involving the nervous system (R29.898)    Time: 6213-0865 PT Time Calculation (min) (ACUTE ONLY): 25 min   Charges:   PT Evaluation $PT Eval Moderate Complexity: 1 Mod PT Treatments $Therapeutic Activity: 8-22 mins PT General Charges $$ ACUTE PT VISIT: 1 Visit         Deklin Bieler B. Beverely Risen PT, DPT Acute Rehabilitation Services Please use secure chat or  Call Office [phone]   [facility] 04/14/2023, 12:49 PM

## 2023-04-14 NOTE — Evaluation (Addendum)
 Occupational Therapy Evaluation Patient Details Name: Tami Jones MRN: [id number] DOB: 09/26/1955 Today's Date: 04/14/2023   History of Present Illness   Pt is a 68 yo female presenting to [facility] for evaluation of L sided weakness. MRI revealed acute R MCA infarcts and punctate R occipital infarcts. PMH of CVA with R sided weakness, DM II, HTN, HLD, anxiety, depression and GERD.     Clinical Impressions PTA pt reports being ind in ADLs and Mod I for mobility with SPC, driving to pickup her grandchildren. Pt currently needing Min A to safely ambulate in room with RW, has impaired awareness of her deficits and needs min A to setup assist for her ADLs. Pt also noted to have R field cut to which she attributes to cataracts, strongly encouraged pt to see an eye doctor before returning to driving, also recommend she follow-up with an outpatient neurologist for impaired sensations. OT to continue following pt acutely to address listed deficits, would benefit from a cognitive assessment before DC. Patient would benefit from post acute Home OT services to help maximize functional independence in natural environment but she would need more initial support from family due to her risk of falls, rec 24/7 supervision until working with [facility],The therapy on balance.      If plan is discharge home, recommend the following:   Supervision due to cognitive status;Direct supervision/assist for financial management;Direct supervision/assist for medications management;A little help with walking and/or transfers;Assistance with cooking/housework     Functional Status Assessment   Patient has had a recent decline in their functional status and demonstrates the ability to make significant improvements in function in a reasonable and predictable amount of time.     Equipment Recommendations   Tub/shower seat (Pt has rec DME)     Recommendations for Other Services         Precautions/Restrictions    Precautions Precautions: Fall Recall of Precautions/Restrictions: Impaired Precaution/Restrictions Comments: reports fall last week over laundry on floor Restrictions Weight Bearing Restrictions Per Provider Order: No     Mobility Bed Mobility               General bed mobility comments: Pt in recliner on arrival, left sitting EOB with RN at bedside    Transfers Overall transfer level: Needs assistance Equipment used: Rolling walker (2 wheels) Transfers: Sit to/from Stand Sit to Stand: Contact guard assist           General transfer comment: CGA from recliner and BSC over toilet.      Balance Overall balance assessment: Needs assistance Sitting-balance support: Feet supported, Bilateral upper extremity supported, No upper extremity supported Sitting balance-Leahy Scale: Fair     Standing balance support: Bilateral upper extremity supported, Single extremity supported, During functional activity, Reliant on assistive device for balance Standing balance-Leahy Scale: Poor                             ADL either performed or assessed with clinical judgement   ADL Overall ADL's : Needs assistance/impaired Eating/Feeding: Independent;Sitting   Grooming: Standing;Wash/dry hands;Contact guard assist   Upper Body Bathing: Sitting;Set up   Lower Body Bathing: Set up;Sitting/lateral leans   Upper Body Dressing : Sitting;Set up   Lower Body Dressing: Sitting/lateral leans;Minimal assistance   Toilet Transfer: Rolling walker (2 wheels);Minimal assistance   Toileting- Clothing Manipulation and Hygiene: Contact guard assist;Sitting/lateral lean       Functional mobility during ADLs: Minimal assistance;Rolling  walker (2 wheels) General ADL Comments: Edcuated pt on [employer] stroke education     Vision Baseline Vision/History: 4 Cataracts;1 Wears glasses (R) Vision Assessment?: Yes Eye Alignment: Within Functional Limits Ocular Range of Motion: Within  Functional Limits Alignment/Gaze Preference: Within Defined Limits Tracking/Visual Pursuits: Able to track stimulus in all quads without difficulty Visual Fields: Right visual field deficit Additional Comments: R field deficits, pt noted to have hx of cataracts and reports things on R side as blurry. Pt missed 3 on the letter E cancellation test all within her nasal field. Scans and reads texts in appropriate fashion using glasses. Discussed seeing eye doctor before driving     Perception         Praxis         Pertinent Vitals/Pain Pain Assessment Pain Assessment: No/denies pain     Extremity/Trunk Assessment Upper Extremity Assessment Upper Extremity Assessment: RUE deficits/detail;LUE deficits/detail;Generalized weakness;Right hand dominant RUE Deficits / Details: Shoulder flexion AROM 60*, weak gross grasp RUE Coordination: decreased fine motor LUE Deficits / Details: Shoulder flexion AROM 40*, weak gross grasp. L fingertips numb. reports she drops things often LUE Sensation: decreased light touch;history of peripheral neuropathy LUE Coordination: decreased fine motor           Communication Communication Communication: No apparent difficulties   Cognition Arousal: Alert Behavior During Therapy: Impulsive Cognition: Cognition impaired     Awareness: Online awareness impaired, Intellectual awareness intact Memory impairment (select all impairments):  (to be further tested) Attention impairment (select first level of impairment): Sustained attention   OT - Cognition Comments: Pt with decreased awareness of her deficits                 Following commands: Impaired Following commands impaired: Follows one step commands inconsistently     Cueing  General Comments   Cueing Techniques: Verbal cues;Tactile cues;Visual cues      Exercises Other Exercises Other Exercises: LUE finger raises   Shoulder Instructions      Home Living Family/patient expects  to be discharged to:: Private residence Living Arrangements: Children (works during day) Available Help at Discharge: Family;Available PRN/intermittently Type of Home: House Home Access: Stairs to enter [employer] of Steps: 3 from back (typical entrance) with L railing and use of SPC; 5 STE from front with B railings Entrance Stairs-Rails: Right;Left Home Layout: One level     Bathroom Shower/Tub: Chief Strategy Officer: Standard Bathroom Accessibility: Yes   Home Equipment: Grab bars - tub/shower;Grab bars - toilet;Cane - single point;Rolling Walker (2 wheels)   Additional Comments: lives with daughter and grandchildren  Lives With: Family    Prior Functioning/Environment Prior Level of Function : Independent/Modified Independent             Mobility Comments: ambulates with cane, reports driving to pick up children from school, despite also reporting decreased sensation in R LE due to peripheral neuropathy ADLs Comments: daughter provides meals and iADLs, pt independent with ADLs    OT Problem List: Impaired balance (sitting and/or standing);Decreased safety awareness;Obesity;Decreased strength;Decreased coordination   OT Treatment/Interventions: Self-care/ADL training;Visual/perceptual remediation/compensation;Therapeutic exercise;Patient/family education;Neuromuscular education;Therapeutic activities;DME and/or AE instruction;Cognitive remediation/compensation      OT Goals(Current goals can be found in the care plan section)   Acute Rehab OT Goals Patient Stated Goal: To go home OT Goal Formulation: With patient Time For Goal Achievement: 04/28/23 Potential to Achieve Goals: Good   OT Frequency:  Min 2X/week    Co-evaluation  AM-PAC OT "6 Clicks" Daily Activity     Outcome Measure Help from another person eating meals?: None Help from another person taking care of personal grooming?: A Little Help from another person  toileting, which includes using toliet, bedpan, or urinal?: A Little Help from another person bathing (including washing, rinsing, drying)?: A Little Help from another person to put on and taking off regular upper body clothing?: A Little Help from another person to put on and taking off regular lower body clothing?: A Little 6 Click Score: 19   End of Session Equipment Utilized During Treatment: Gait belt;Rolling walker (2 wheels) Nurse Communication: Mobility status  Activity Tolerance: Patient tolerated treatment well Patient left: in bed;with call bell/phone within reach;with nursing/sitter in room  OT Visit Diagnosis: Unsteadiness on feet (R26.81);Muscle weakness (generalized) (M62.81);Hemiplegia and hemiparesis Hemiplegia - Right/Left: Left Hemiplegia - dominant/non-dominant: Non-Dominant Hemiplegia - caused by: Cerebral infarction                Time: 2595-6387 OT Time Calculation (min): 40 min Charges:  OT General Charges $OT Visit: 1 Visit OT Evaluation $OT Eval Moderate Complexity: 1 Mod OT Treatments $Self Care/Home Management : 8-22 mins $Therapeutic Activity: 8-22 mins  04/14/2023  AB, OTR/L  Acute Rehabilitation Services  Office: [phone]   Tristan Schroeder 04/14/2023, 5:36 PM

## 2023-04-14 NOTE — Plan of Care (Signed)
  Problem: Ischemic Stroke/TIA Tissue Perfusion: Goal: Complications of ischemic stroke/TIA will be minimized Outcome: Progressing   Problem: Coping: Goal: Will verbalize positive feelings about self Outcome: Progressing   Problem: Self-Care: Goal: Ability to participate in self-care as condition permits will improve Outcome: Progressing   Problem: Coping: Goal: Ability to adjust to condition or change in health will improve Outcome: Progressing   Problem: Activity: Goal: Risk for activity intolerance will decrease Outcome: Progressing   Problem: Skin Integrity: Goal: Risk for impaired skin integrity will decrease Outcome: Progressing

## 2023-04-14 NOTE — Progress Notes (Signed)
 Bilateral lower extremity venous duplex has been completed. Preliminary results can be found in CV Proc through chart review.   04/14/23 9:55 AM Olen Cordial RVT

## 2023-04-15 ENCOUNTER — Encounter (HOSPITAL_COMMUNITY): Payer: Self-pay | Admitting: Cardiology

## 2023-04-15 ENCOUNTER — Encounter (HOSPITAL_COMMUNITY)
Admission: EM | Disposition: A | Discharge status: Home Health | Payer: Self-pay | Source: Home / Self Care | Attending: Family Medicine

## 2023-04-15 DIAGNOSIS — I6523 Occlusion and stenosis of bilateral carotid arteries: Secondary | ICD-10-CM

## 2023-04-15 DIAGNOSIS — E785 Hyperlipidemia, unspecified: Secondary | ICD-10-CM

## 2023-04-15 DIAGNOSIS — I63411 Cerebral infarction due to embolism of right middle cerebral artery: Secondary | ICD-10-CM | POA: Diagnosis not present

## 2023-04-15 DIAGNOSIS — R002 Palpitations: Secondary | ICD-10-CM

## 2023-04-15 DIAGNOSIS — I639 Cerebral infarction, unspecified: Secondary | ICD-10-CM | POA: Diagnosis not present

## 2023-04-15 HISTORY — PX: LOOP RECORDER INSERTION: EP1214

## 2023-04-15 LAB — CBC WITH DIFFERENTIAL/PLATELET
Abs Immature Granulocytes: 0.01 10*3/uL (ref 0.00–0.07)
Basophils Absolute: 0 10*3/uL (ref 0.0–0.1)
Basophils Relative: 1 %
Eosinophils Absolute: 0.1 10*3/uL (ref 0.0–0.5)
Eosinophils Relative: 2 %
HCT: 38.6 % (ref 36.0–46.0)
Hemoglobin: 13.2 g/dL (ref 12.0–15.0)
Immature Granulocytes: 0 %
Lymphocytes Relative: 43 %
Lymphs Abs: 2.2 10*3/uL (ref 0.7–4.0)
MCH: 31.4 pg (ref 26.0–34.0)
MCHC: 34.2 g/dL (ref 30.0–36.0)
MCV: 91.9 fL (ref 80.0–100.0)
Monocytes Absolute: 0.4 10*3/uL (ref 0.1–1.0)
Monocytes Relative: 8 %
Neutro Abs: 2.4 10*3/uL (ref 1.7–7.7)
Neutrophils Relative %: 46 %
Platelets: 212 10*3/uL (ref 150–400)
RBC: 4.2 MIL/uL (ref 3.87–5.11)
RDW: 12.8 % (ref 11.5–15.5)
WBC: 5.1 10*3/uL (ref 4.0–10.5)
nRBC: 0 % (ref 0.0–0.2)

## 2023-04-15 LAB — BASIC METABOLIC PANEL
Anion gap: 8 (ref 5–15)
BUN: 17 mg/dL (ref 8–23)
CO2: 25 mmol/L (ref 22–32)
Calcium: 8.7 mg/dL — ABNORMAL LOW (ref 8.9–10.3)
Chloride: 106 mmol/L (ref 98–111)
Creatinine, Ser: 1.23 mg/dL — ABNORMAL HIGH (ref 0.44–1.00)
GFR, Estimated: 48 mL/min — ABNORMAL LOW (ref >60)
Glucose, Bld: 90 mg/dL (ref 70–99)
Potassium: 3.7 mmol/L (ref 3.5–5.1)
Sodium: 139 mmol/L (ref 135–145)

## 2023-04-15 LAB — GLUCOSE, CAPILLARY
Glucose-Capillary: 88 mg/dL (ref 70–99)
Glucose-Capillary: 129 mg/dL — ABNORMAL HIGH (ref 70–99)
Glucose-Capillary: 118 mg/dL — ABNORMAL HIGH (ref 70–99)

## 2023-04-15 SURGERY — LOOP RECORDER INSERTION

## 2023-04-15 MED ORDER — LIDOCAINE-EPINEPHRINE 1 %-1:100000 IJ SOLN
INTRAMUSCULAR | Status: DC | PRN
  Administered 2023-04-15: 15 mL

## 2023-04-15 MED ORDER — EZETIMIBE 10 MG PO TABS: 10.0000 mg | ORAL_TABLET | Freq: Every day | ORAL | 3 refills | Status: DC

## 2023-04-15 MED ORDER — METOPROLOL SUCCINATE ER 25 MG PO TB24: 50.0000 mg | ORAL_TABLET | Freq: Every day | ORAL | 3 refills | Status: DC

## 2023-04-15 MED ORDER — LIDOCAINE-EPINEPHRINE 1 %-1:100000 IJ SOLN
INTRAMUSCULAR | Status: AC
  Filled 2023-04-15: qty 1

## 2023-04-15 SURGICAL SUPPLY — 2 items
MONITOR REVEAL LINQ II (Prosthesis & Implant Heart) IMPLANT
PACK LOOP INSERTION (CUSTOM PROCEDURE TRAY) ×1 IMPLANT

## 2023-04-15 NOTE — Consult Note (Addendum)
 ELECTROPHYSIOLOGY CONSULT NOTE  Patient ID: Jasenia Weilbacher Birkhead MRN: [id number], DOB/AGE: 67-67-1957   Admit date: 04/12/2023 Date of Consult: 04/15/2023  Primary Physician: Karie Schwalbe, MD Primary Cardiologist: None  Primary Electrophysiologist: New to None  Reason for Consultation: Cryptogenic stroke; recommendations regarding Implantable Loop Recorder Insurance: [employer]  History of Present Illness: 68 y/o F who presented to [facility] after waking up with left sided weakness, gait instability.   EP has been asked to evaluate [facility] for placement of an implantable loop recorder to monitor for atrial fibrillation by Dr Roda Shutters.  The patient was admitted on 04/12/2023 after waking up with left sided weakness, gait instability, 2 falls striking her head in the preceding week.    Imaging demonstrated acute right frontoparietal infarcts, acute right occipital infarct, and prior multiple CVA.  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 MRI brain patchy acute right frontoparietal infarcts punctate acute right occipital infarct moderate chronic small vessel ischemic disease. MRI cervical spine cervical spondylosis C5-C6 with moderate canal stenosis    She has undergone workup for stroke including:  CT no acute abnormality, chronic bilateral PCA distribution infarcts. MRI patchy acute right frontal parietal infarcts, and punctate acute right occipital infarct.  Chronic bilateral PCA infarcts. CT head and neck bilateral ICA bulb atherosclerosis, left more than right.  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  Left V4 moderate stenosis, bilateral ICA siphon stenosis left more than right, right M1 moderate to severe stenosis, left P2, right P1/P2  moderate severe stenosis. 2D Echo EF 60 to 65% LE venous Doppler no DVT LDL 134 HgbA1c 6.8 UDS negative Lovenox for VTE prophylaxis Aspirin 81 mg daily and clopidogrel 75 mg daily prior to admission, now on aspirin 81 mg daily and clopidogrel 75 mg daily.   The patient has been monitored on telemetry which has demonstrated sinus rhythm with no arrhythmias.  Inpatient stroke work-up Pranay Hilbun not require a TEE per Neurology.   Echocardiogram as above. Lab work is reviewed.  Prior to admission, the patient denies chest pain, shortness of breath, dizziness, palpitations, or syncope.  She is recovering from his stroke with plans to return home  at discharge.  Allergies, Past Medical, Surgical, Social, and Family Histories have been reviewed and are referenced here-in when relevant for medical decision making.   Inpatient Medications:   amLODipine  10 mg Oral QPM   aspirin EC  81 mg Oral QPM   atorvastatin  80 mg Oral QPM   clopidogrel  75 mg Oral Daily   enoxaparin (LOVENOX) injection  40 mg Subcutaneous Q24H   ezetimibe  10 mg Oral Daily   feeding supplement  237 mL Oral BID BM   glipiZIDE  5 mg Oral BID   insulin aspart  0-15 Units Subcutaneous TID WC   metoprolol succinate  25 mg Oral Daily   pantoprazole  40 mg Oral Daily    Physical Exam: Vitals:   04/14/23 2358 04/15/23 0340 04/15/23 0810 04/15/23 1001  BP: (!) 159/78 (!) 176/77 (!) 159/77 (!) 205/90  Pulse: 67 61 65 78  Resp: 20 16 18    Temp: (!) 97.5 F (36.4 C) 98.8 F (37.1 C) 97.6 F (36.4 C)   TempSrc: Oral  Oral   SpO2: 100% 99% 100% 100%  Weight:      Height:  GEN- NAD. A&O x 3. Normal affect. HEENT: Normocephalic, atraumatic Lungs- CTAB, Normal effort.  Heart- Regular rate and rhythm rate and rhythm. No M/G/R.  Extremities- No peripheral edema. no clubbing or cyanosis Skin- warm and dry, no rash or lesion. Neuro: Awake, alert, speech clear, MAE   12-lead ECG: 02/09/24 NSR (personally reviewed) All  prior EKG's dating back to 2000 in EPIC reviewed with no documented atrial fibrillation  Telemetry SB 58-SR 80's (personally reviewed)  Assessment and Plan:  1. Cryptogenic Stroke The patient presents with cryptogenic stroke.  The patient does not have a TEE planned for this AM.  I spoke at length with the patient about monitoring for afib with an implantable loop recorder.  Risks, benefits, and alteratives to implantable loop recorder were discussed with the patient today.   At this time, the patient is very clear in their decision to proceed with implantable loop recorder.     Wound care was reviewed with the patient (keep incision clean and dry for 3 days). Please call with questions.     Canary Brim, NP-C, AGACNP-BC [facility] - Electrophysiology  04/15/2023, 12:32 PM   I have seen and examined this patient with Canary Brim.  Agree with above, note added to reflect my findings.  Patient presented to the hospital with left-sided weakness and gait instability.  She was found to have a right MCA stroke.  She currently feels well.  She has recovered and is no longer having weakness.  Thus far, workup has been negative.  GEN: Well nourished, well developed, in no acute distress  HEENT: normal  Neck: no JVD, carotid bruits, or masses Cardiac: RRR; no murmurs, rubs, or gallops,no edema  Respiratory:  clear to auscultation bilaterally, normal work of breathing GI: soft, nontender, nondistended, + BS MS: no deformity or atrophy  Skin: warm and dry Neuro:  Strength and sensation are intact Psych: euthymic mood, full affect   Cryptogenic stroke: Patient is post MCA stroke.  Workup thus far been negative.  She has been in sinus rhythm without any atrial fibrillation or flutter on telemetry.  Due to this, she would benefit from ILR implant.  Risk and benefits have been discussed.  Risk include bleeding and infection.  She understands the risks and is agreed to the  procedure.  Marlaina Coburn M. Haniel Fix MD 04/15/2023 2:10 PM

## 2023-04-15 NOTE — Plan of Care (Signed)
  Problem: Education: Goal: Knowledge of disease or condition will improve Outcome: Progressing   Problem: Ischemic Stroke/TIA Tissue Perfusion: Goal: Complications of ischemic stroke/TIA will be minimized Outcome: Progressing   Problem: Coping: Goal: Will identify appropriate support needs Outcome: Progressing   Problem: Health Behavior/Discharge Planning: Goal: Goals will be collaboratively established with patient/family Outcome: Progressing   Problem: Self-Care: Goal: Ability to communicate needs accurately will improve Outcome: Progressing   Problem: Metabolic: Goal: Ability to maintain appropriate glucose levels will improve Outcome: Progressing   Problem: Education: Goal: Knowledge of General Education information will improve Description: Including pain rating scale, medication(s)/side effects and non-pharmacologic comfort measures Outcome: Progressing   Problem: Health Behavior/Discharge Planning: Goal: Ability to manage health-related needs will improve Outcome: Progressing   Problem: Clinical Measurements: Goal: Respiratory complications will improve Outcome: Progressing

## 2023-04-15 NOTE — Progress Notes (Addendum)
 STROKE TEAM PROGRESS NOTE   SUBJECTIVE (INTERVAL HISTORY) No family at bedside. Neuro exam stable.  Discussed Leqvio medication, patient signed start form. Discussed needed neurological follow-ups post discharge, patient in agreement.  OBJECTIVE Temp:  [97.5 F (36.4 C)-98.8 F (37.1 C)] 98.2 F (36.8 C) (02/28 1225) Pulse Rate:  [61-78] 73 (02/28 1225) Cardiac Rhythm: Normal sinus rhythm (02/28 0700) Resp:  [16-20] 17 (02/28 1225) BP: (157-205)/(77-90) 161/77 (02/28 1225) SpO2:  [96 %-100 %] 99 % (02/28 1225)  Recent Labs  Lab 04/14/23 1209 04/14/23 1614 04/14/23 2135 04/15/23 0622 04/15/23 1226  GLUCAP 113* 130* 163* 88 129*   Recent Labs  Lab 04/12/23 1731 04/12/23 1828 04/14/23 0626 04/15/23 0652  NA 142 142 141 139  K 3.7 3.6 3.9 3.7  CL 107 108 107 106  CO2 22  --  25 25  GLUCOSE 108* 101* 91 90  BUN 20 22 13 17   CREATININE 1.42* 1.50* 1.19* 1.23*  CALCIUM 9.0  --  9.1 8.7*   Recent Labs  Lab 04/12/23 1731  AST 24  ALT 23  ALKPHOS 61  BILITOT 0.6  PROT 6.4*  ALBUMIN 3.7   Recent Labs  Lab 04/12/23 1731 04/12/23 1828 04/14/23 0626 04/15/23 0652  WBC 7.2  --  5.1 5.1  NEUTROABS 4.3  --   --  2.4  HGB 13.9 15.6* 13.5 13.2  HCT 41.4 46.0 39.7 38.6  MCV 93.5  --  91.5 91.9  PLT 236  --  209 212   No results for input(s): "CKTOTAL", "CKMB", "CKMBINDEX", "TROPONINI" in the last 168 hours. Recent Labs    04/12/23 1731  LABPROT 13.3  INR 1.0   Recent Labs    04/13/23 0930  COLORURINE YELLOW  LABSPEC 1.042*  PHURINE 5.0  GLUCOSEU NEGATIVE  HGBUR NEGATIVE  BILIRUBINUR NEGATIVE  KETONESUR NEGATIVE  PROTEINUR NEGATIVE  NITRITE NEGATIVE  LEUKOCYTESUR SMALL*       Component Value Date/Time   CHOL 188 04/14/2023 0626   CHOL 238 (H) 02/17/2017 1300   TRIG 105 04/14/2023 0626   HDL 33 (L) 04/14/2023 0626   HDL 39 (L) 02/17/2017 1300   CHOLHDL 5.7 04/14/2023 0626   VLDL 21 04/14/2023 0626   LDLCALC 134 (H) 04/14/2023 0626   LDLCALC  164 (H) 02/17/2017 1300   Lab Results  Component Value Date   HGBA1C 6.8 (A) 03/24/2023      Component Value Date/Time   LABOPIA NONE DETECTED 04/12/2023 0930   COCAINSCRNUR NONE DETECTED 04/12/2023 0930   COCAINSCRNUR NONE DETECTED 03/25/2022 0033   LABBENZ NONE DETECTED 04/12/2023 0930   AMPHETMU NONE DETECTED 04/12/2023 0930   THCU NONE DETECTED 04/12/2023 0930   LABBARB NONE DETECTED 04/12/2023 0930    Recent Labs  Lab 04/12/23 1731  ETH <10    I have personally reviewed the radiological images below and agree with the radiology interpretations.  VAS [place] LOWER EXTREMITY VENOUS (DVT) Result Date: 04/15/2023  Lower Venous DVT Study Patient Name:  JISSELL TRAFTON  Date of Exam:   04/14/2023 Medical Rec #: [id number]       Accession #:    [id number] Date of Birth: 27-Feb-1955       Patient Gender: F Patient Age:   68 years Exam Location:  [facility] Procedure:      VAS [place] LOWER EXTREMITY VENOUS (DVT) Referring Phys: Scheryl Marten Makalya Nave --------------------------------------------------------------------------------  Indications: Stroke.  Risk Factors: None identified. Limitations: Poor ultrasound/tissue interface and patient pain tolerance. Comparison Study: No prior  studies. Performing Technologist: Chanda Busing RVT  Examination Guidelines: A complete evaluation includes B-mode imaging, spectral Doppler, color Doppler, and power Doppler as needed of all accessible portions of each vessel. Bilateral testing is considered an integral part of a complete examination. Limited examinations for reoccurring indications may be performed as noted. The reflux portion of the exam is performed with the patient in reverse Trendelenburg.  +---------+---------------+---------+-----------+----------+--------------+ RIGHT    CompressibilityPhasicitySpontaneityPropertiesThrombus Aging +---------+---------------+---------+-----------+----------+--------------+ CFV      Full           Yes      Yes                                  +---------+---------------+---------+-----------+----------+--------------+ SFJ      Full                                                        +---------+---------------+---------+-----------+----------+--------------+ FV Prox  Full                                                        +---------+---------------+---------+-----------+----------+--------------+ FV Mid   Full                                                        +---------+---------------+---------+-----------+----------+--------------+ FV DistalFull                                                        +---------+---------------+---------+-----------+----------+--------------+ PFV      Full                                                        +---------+---------------+---------+-----------+----------+--------------+ POP      Full           Yes      Yes                                 +---------+---------------+---------+-----------+----------+--------------+ PTV      Full                                                        +---------+---------------+---------+-----------+----------+--------------+ PERO     Full                                                        +---------+---------------+---------+-----------+----------+--------------+   +---------+---------------+---------+-----------+----------+--------------+  LEFT     CompressibilityPhasicitySpontaneityPropertiesThrombus Aging +---------+---------------+---------+-----------+----------+--------------+ CFV      Full           Yes      Yes                                 +---------+---------------+---------+-----------+----------+--------------+ SFJ      Full                                                        +---------+---------------+---------+-----------+----------+--------------+ FV Prox  Full                                                         +---------+---------------+---------+-----------+----------+--------------+ FV Mid   Full                                                        +---------+---------------+---------+-----------+----------+--------------+ FV Distal               Yes      Yes                                 +---------+---------------+---------+-----------+----------+--------------+ PFV      Full                                                        +---------+---------------+---------+-----------+----------+--------------+ POP      Full           Yes      Yes                                 +---------+---------------+---------+-----------+----------+--------------+ PTV      Full                                                        +---------+---------------+---------+-----------+----------+--------------+ PERO     Full                                                        +---------+---------------+---------+-----------+----------+--------------+     Summary: RIGHT: - There is no evidence of deep vein thrombosis in the lower extremity.  - No cystic structure found in the popliteal fossa.  LEFT: - There is no evidence of deep vein thrombosis in the lower extremity. However, portions of this examination  were limited- see technologist comments above.  - No cystic structure found in the popliteal fossa.  *See table(s) above for measurements and observations. Electronically signed by Carolynn Sayers on 04/15/2023 at 7:13:09 AM.    Final    ECHOCARDIOGRAM COMPLETE BUBBLE STUDY Result Date: 04/13/2023    ECHOCARDIOGRAM REPORT   Patient Name:   RHETTA CLEEK Date of Exam: 04/13/2023 Medical Rec #:  [id number]      Height:       63.0 in Accession #:    [id number]     Weight:       213.0 lb Date of Birth:  [birth year]      BSA:          1.986 m Patient Age:    67 years       BP:           208/67 mmHg Patient Gender: F              HR:           66 bpm. Exam Location:  Inpatient Procedure: 2D Echo,  Cardiac Doppler, Color Doppler, Saline Contrast Bubble Study            and Intracardiac Opacification Agent (Both Spectral and Color Flow            Doppler were utilized during procedure). Indications:    Stroke  History:        Patient has prior history of Echocardiogram examinations, most                 recent 11/25/2022. Risk Factors:Hypertension, Diabetes and                 Dyslipidemia.  Sonographer:    Karma Ganja Referring Phys: [id number] PROSPER M AMPONSAH  Sonographer Comments: Technically difficult study due to poor echo windows and patient is obese. Image acquisition challenging due to patient body habitus. IMPRESSIONS  1. Left ventricular ejection fraction, by estimation, is 60 to 65%. The left ventricle has normal function. The left ventricle has no regional wall motion abnormalities. There is mild concentric left ventricular hypertrophy. Left ventricular diastolic parameters are consistent with Grade I diastolic dysfunction (impaired relaxation).  2. Right ventricular systolic function is normal. The right ventricular size is normal. There is normal pulmonary artery systolic pressure.  3. Left atrial size was mildly dilated.  4. Bubble study low quality due to body habitus but appears negative. Agitated saline contrast bubble study was negative, with no evidence of any interatrial shunt.  5. The mitral valve is normal in structure. Trivial mitral valve regurgitation. No evidence of mitral stenosis.  6. The aortic valve is tricuspid. There is mild calcification of the aortic valve. Aortic valve regurgitation is not visualized. Aortic valve sclerosis/calcification is present, without any evidence of aortic stenosis. FINDINGS  Left Ventricle: Left ventricular ejection fraction, by estimation, is 60 to 65%. The left ventricle has normal function. The left ventricle has no regional wall motion abnormalities. Definity contrast agent was given IV to delineate the left ventricular  endocardial borders.  Strain imaging was not performed. The left ventricular internal cavity size was normal in size. There is mild concentric left ventricular hypertrophy. Left ventricular diastolic parameters are consistent with Grade I diastolic dysfunction (impaired relaxation). Right Ventricle: The right ventricular size is normal. No increase in right ventricular wall thickness. Right ventricular systolic function is normal. There is normal pulmonary artery systolic pressure. The tricuspid regurgitant velocity is 1.98 m/s, and  with an assumed right atrial pressure of 3 mmHg, the estimated right ventricular systolic pressure is 18.7 mmHg. Left Atrium: Left atrial size was mildly dilated. Right Atrium: Right atrial size was normal in size. Pericardium: There is no evidence of pericardial effusion. Mitral Valve: The mitral valve is normal in structure. Mild mitral annular calcification. Trivial mitral valve regurgitation. No evidence of mitral valve stenosis. Tricuspid Valve: The tricuspid valve is normal in structure. Tricuspid valve regurgitation is not demonstrated. No evidence of tricuspid stenosis. Aortic Valve: The aortic valve is tricuspid. There is mild calcification of the aortic valve. Aortic valve regurgitation is not visualized. Aortic valve sclerosis/calcification is present, without any evidence of aortic stenosis. Aortic valve mean gradient measures 2.0 mmHg. Aortic valve peak gradient measures 4.8 mmHg. Aortic valve area, by VTI measures 2.10 cm. Pulmonic Valve: The pulmonic valve was not well visualized. Pulmonic valve regurgitation is trivial. No evidence of pulmonic stenosis. Aorta: The aortic root is normal in size and structure. Venous: The inferior vena cava was not well visualized. IAS/Shunts: No atrial level shunt detected by color flow Doppler. Agitated saline contrast was given intravenously to evaluate for intracardiac shunting. Agitated saline contrast bubble study was negative, with no evidence of any  interatrial shunt. Additional Comments: 3D imaging was not performed.  LEFT VENTRICLE PLAX 2D LVIDd:         4.50 cm   Diastology LVIDs:         3.00 cm   LV e' medial:    4.82 cm/s LV PW:         1.10 cm   LV E/e' medial:  12.9 LV IVS:        1.10 cm   LV e' lateral:   4.97 cm/s LVOT diam:     2.10 cm   LV E/e' lateral: 12.5 LV SV:         49 LV SV Index:   25 LVOT Area:     3.46 cm  RIGHT VENTRICLE RV S prime:     14.50 cm/s TAPSE (M-mode): 2.0 cm LEFT ATRIUM             Index LA diam:        3.90 cm 1.96 cm/m LA Vol (A2C):   58.8 ml 29.61 ml/m LA Vol (A4C):   74.9 ml 37.71 ml/m LA Biplane Vol: 69.4 ml 34.94 ml/m  AORTIC VALVE AV Area (Vmax):    2.15 cm AV Area (Vmean):   1.78 cm AV Area (VTI):     2.10 cm AV Vmax:           109.00 cm/s AV Vmean:          72.400 cm/s AV VTI:            0.233 m AV Peak Grad:      4.8 mmHg AV Mean Grad:      2.0 mmHg LVOT Vmax:         67.70 cm/s LVOT Vmean:        37.200 cm/s LVOT VTI:          0.141 m LVOT/AV VTI ratio: 0.61  AORTA Ao Root diam: 3.10 cm Ao Asc diam:  2.90 cm MITRAL VALVE               TRICUSPID VALVE MV Area (PHT): 3.12 cm    TR Peak grad:   15.7 mmHg MV Decel Time: 243 msec    TR Vmax:  198.00 cm/s MV E velocity: 62.20 cm/s MV A velocity: 80.50 cm/s  SHUNTS MV E/A ratio:  0.77        Systemic VTI:  0.14 m                            Systemic Diam: 2.10 cm Arvilla Meres MD Electronically signed by Arvilla Meres MD Signature Date/Time: 04/13/2023/11:40:36 AM    Final    MR Cervical Spine Wo Contrast Result Date: 04/13/2023 CLINICAL DATA:  Ataxia, nontraumatic. Concern for cervical pathology. EXAM: MRI CERVICAL SPINE WITHOUT CONTRAST TECHNIQUE: Multiplanar, multisequence MR imaging of the cervical spine was performed. No intravenous contrast was administered. COMPARISON:  None Available. FINDINGS: Alignment: Straightening of the normal cervical lordosis. Trace retrolisthesis of C5 on C6. Vertebrae: Degenerative endplate changes at multiple  levels with mild discogenic edema at C5-6. Vertebral body heights are maintained. No evidence of acute fracture. Small hemangioma in the T1 vertebral body. Cord: Normal signal and morphology. Posterior Fossa, vertebral arteries, paraspinal tissues: Paraspinal musculature is unremarkable. Visualized posterior fossa is unremarkable. Abnormal appearance of the right vertebral artery flow void at the V2 segment which could be related to atherosclerosis. Retropharyngeal course of the carotid arteries which indents the posterior wall of the oropharynx. Disc levels: C2-3: Increased T2/stir signal in the disc likely reflecting degenerative changes. No significant disc bulge. No significant spinal canal stenosis. Bilateral facet arthrosis. No significant foraminal stenosis. C3-4: Mild disc height loss. Disc osteophyte complex which indents the ventral thecal sac without contacting the spinal cord. Bilateral facet arthrosis and uncovertebral hypertrophy. Moderate right and mild-to-moderate left foraminal stenosis. C4-5: Disc osteophyte complex indents the ventral thecal sac without contacting the spinal cord. Bilateral facet arthrosis. Uncovertebral hypertrophy greater on the left. Mild right and moderate left foraminal stenosis. C5-6: Mild disc height loss. Disc osteophyte complex indents the ventral thecal sac with mild flattening of the ventral cervical cord. Thickening of the ligamentum flavum. There is moderate spinal canal stenosis. No cord signal abnormality. Facet arthrosis and uncovertebral hypertrophy. Mild right and moderate left foraminal stenosis. C6-7: Disc bulge indents the ventral thecal sac without contacting the spinal cord. Bilateral facet arthrosis. No significant foraminal stenosis. C7-T1: No significant spinal canal stenosis. No significant foraminal stenosis. IMPRESSION: 1. Cervical spondylosis most significant at C5-C6 where there is moderate spinal canal stenosis and mild flattening of the ventral  cord. No cord signal abnormality. 2. Moderate foraminal stenosis at multiple levels as above. 3. Abnormal appearance of the right vertebral artery V2 segment which could be related to atherosclerosis/stenosis. Consider CTA for further evaluation. Electronically Signed   By: Emily Filbert M.D.   On: 04/13/2023 09:03   MR BRAIN WO CONTRAST Result Date: 04/13/2023 CLINICAL DATA:  Neuro deficit, acute, stroke suspected. Left upper and lower extremity weakness. EXAM: MRI HEAD WITHOUT CONTRAST TECHNIQUE: Multiplanar, multiecho pulse sequences of the brain and surrounding structures were obtained without intravenous contrast. COMPARISON:  Head CT and CTA 04/12/2023 and MRI 11/24/2022 FINDINGS: Brain: Patchy acute cortical and subcortical infarcts are present in the right frontal greater than right parietal lobes, including involvement of the precentral gyrus. A few punctate acute cortical infarcts are also present in the right occipital lobe. Patchy T2 hyperintensities elsewhere in the cerebral white matter bilaterally and in the pons are similar to the prior MRI and are nonspecific but compatible with moderate chronic small vessel ischemic disease. Chronic lacunar infarcts are again noted in the basal ganglia and deep  cerebral white matter bilaterally. Chronic bilateral PCA infarcts are again noted with associated chronic blood products. No age advanced global cerebral atrophy is evident. No mass, midline shift, hydrocephalus, or extra-axial fluid collection is identified. Vascular: Major intracranial vascular flow voids are preserved. Skull and upper cervical spine: Unremarkable bone marrow signal. Sinuses/Orbits: Unremarkable orbits. Paranasal sinuses and mastoid air cells are clear. Other: None. IMPRESSION: 1. Patchy acute right frontoparietal infarcts (MCA territory). 2. Punctate acute right occipital infarcts. 3. Moderate chronic small vessel ischemic disease. Chronic bilateral PCA infarcts. Electronically Signed    By: Sebastian Ache M.D.   On: 04/13/2023 08:11   CT Head Wo Contrast Result Date: 04/12/2023 CLINICAL DATA:  Initial evaluation for neuro deficit, stroke suspected. EXAM: CT ANGIOGRAPHY HEAD AND NECK WITH AND WITHOUT CONTRAST TECHNIQUE: Multidetector CT imaging of the head and neck was performed using the standard protocol during bolus administration of intravenous contrast. Multiplanar CT image reconstructions and MIPs were obtained to evaluate the vascular anatomy. Carotid stenosis measurements (when applicable) are obtained utilizing NASCET criteria, using the distal internal carotid diameter as the denominator. RADIATION DOSE REDUCTION: This exam was performed according to the departmental dose-optimization program which includes automated exposure control, adjustment of the mA and/or kV according to patient size and/or use of iterative reconstruction technique. CONTRAST:  75mL OMNIPAQUE IOHEXOL 350 MG/ML SOLN COMPARISON:  Prior study from 11/24/2022 FINDINGS: CT HEAD FINDINGS Brain: Cerebral volume within normal limits. Sequelae of chronic microvascular ischemic disease with a few small remote lacunar infarcts about the corona radiata/basal ganglia. Chronic bilateral PCA distribution infarcts noted. No acute intracranial hemorrhage. No visible acute cortically based infarct. No mass lesion or midline shift. No hydrocephalus or extra-axial fluid collection. Vascular: No abnormal hyperdense vessel. Skull: Scalp soft tissues within normal limits.  Calvarium intact. Sinuses/Orbits: Globes and orbital soft tissues within normal limits. Paranasal sinuses and mastoid air cells are largely clear. Other: None. Review of the MIP images confirms the above findings CTA NECK FINDINGS Aortic arch: Mild aortic atherosclerosis. Standard branching. Imaged portion shows no evidence of aneurysm or dissection. No significant stenosis of the major arch vessel origins. Right carotid system: Right common and internal carotid  arteries are patent without dissection. Mild atheromatous change about the right carotid bulb without hemodynamically significant greater than 50% stenosis. Left carotid system: Left common and internal carotid arteries are patent without dissection. Mild-to-moderate atheromatous change about the left carotid bulb without hemodynamically significant greater than 50% stenosis. Vertebral arteries: Both vertebral arteries arise from the subclavian arteries. Left vertebral artery strongly dominant and patent without stenosis or dissection. 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 Irregular attenuated intermittent opacification through the right V2 segment. Right vertebral artery is patent at the right V3 segment with subsequent wide patency as it courses into the cranial vault. Appearance is relatively similar to prior. Skeleton: No discrete or worrisome osseous lesions. Mild to moderate cervical spondylosis, most pronounced at C5-6. Scattered dental caries noted. Other neck: No other acute finding. Upper chest: No other acute finding. Review of the MIP images confirms the above findings CTA HEAD FINDINGS Anterior circulation: Calcified atherosclerosis present about the carotid siphons bilaterally with estimated 50-60% stenosis bilaterally, similar to prior. A1 segments patent bilaterally, with the left being dominant. Normal anterior communicating artery complex. Atheromatous irregularity throughout the ACAs without high-grade stenosis. Atheromatous irregularity about the M1 segments without high-grade stenosis. No proximal MCA branch occlusion. Distal small vessel atheromatous irregularity throughout the MCA branches bilaterally. Appearance is relatively  similar. Posterior circulation: Hypoplastic right vertebral artery largely terminates in PICA. Distal right V4 segment minimally opacified with subsequent occlusion prior to the vertebrobasilar junction. Atherosclerotic change  about the dominant left V4 segment with moderate multifocal stenoses, similar. Both PICA remain patent. Basilar is somewhat diminutive and diffusely irregular without focal high-grade stenosis. Superior cerebral arteries patent bilaterally. Atherosclerotic change about both PCAs which are irregular and attenuated in appearance. Moderate to severe stenoses noted at the left P2 segment and proximal right P1/P2 segments. PCAs remain patent to their distal aspects. Appearance is similar. Venous sinuses: Patent allowing for timing the contrast bolus. Anatomic variants: As above.  No aneurysm. Review of the MIP images confirms the above findings IMPRESSION: CT HEAD: 1. No acute intracranial abnormality. 2. Chronic bilateral PCA distribution infarcts. 3. Underlying chronic microvascular ischemic disease with a few remote lacunar infarcts about the bilateral basal ganglia/corona radiata. CTA HEAD AND NECK: 1. Negative CTA for large vessel occlusion or other emergent finding. 2. Diffuse intracranial atherosclerotic disease as detailed above, overall similar as compared to prior CTA from 03/26/2022. 3. 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 Irregular intermittent opacification within the neck, but patent at the skull base, likely from collateral flow. Right vertebral artery functionally terminates in PICA. Moderate multifocal stenoses about the contralateral dominant left V4 segment. 4.  Aortic Atherosclerosis (ICD10-I70.0). Electronically Signed   By: Rise Mu M.D.   On: 04/12/2023 20:45   CT ANGIO HEAD NECK W WO CM Result Date: 04/12/2023 CLINICAL DATA:  Initial evaluation for neuro deficit, stroke suspected. EXAM: CT ANGIOGRAPHY HEAD AND NECK WITH AND WITHOUT CONTRAST TECHNIQUE: Multidetector CT imaging of the head and neck was performed using the standard protocol during bolus administration of intravenous contrast. Multiplanar CT image reconstructions and MIPs were obtained to evaluate  the vascular anatomy. Carotid stenosis measurements (when applicable) are obtained utilizing NASCET criteria, using the distal internal carotid diameter as the denominator. RADIATION DOSE REDUCTION: This exam was performed according to the departmental dose-optimization program which includes automated exposure control, adjustment of the mA and/or kV according to patient size and/or use of iterative reconstruction technique. CONTRAST:  75mL OMNIPAQUE IOHEXOL 350 MG/ML SOLN COMPARISON:  Prior study from 11/24/2022 FINDINGS: CT HEAD FINDINGS Brain: Cerebral volume within normal limits. Sequelae of chronic microvascular ischemic disease with a few small remote lacunar infarcts about the corona radiata/basal ganglia. Chronic bilateral PCA distribution infarcts noted. No acute intracranial hemorrhage. No visible acute cortically based infarct. No mass lesion or midline shift. No hydrocephalus or extra-axial fluid collection. Vascular: No abnormal hyperdense vessel. Skull: Scalp soft tissues within normal limits.  Calvarium intact. Sinuses/Orbits: Globes and orbital soft tissues within normal limits. Paranasal sinuses and mastoid air cells are largely clear. Other: None. Review of the MIP images confirms the above findings CTA NECK FINDINGS Aortic arch: Mild aortic atherosclerosis. Standard branching. Imaged portion shows no evidence of aneurysm or dissection. No significant stenosis of the major arch vessel origins. Right carotid system: Right common and internal carotid arteries are patent without dissection. Mild atheromatous change about the right carotid bulb without hemodynamically significant greater than 50% stenosis. Left carotid system: Left common and internal carotid arteries are patent without dissection. Mild-to-moderate atheromatous change about the left carotid bulb without hemodynamically significant greater than 50% stenosis. Vertebral arteries: Both vertebral arteries arise from the subclavian arteries.  Left vertebral artery strongly dominant and patent without stenosis or dissection. 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 Irregular attenuated intermittent  opacification through the right V2 segment. Right vertebral artery is patent at the right V3 segment with subsequent wide patency as it courses into the cranial vault. Appearance is relatively similar to prior. Skeleton: No discrete or worrisome osseous lesions. Mild to moderate cervical spondylosis, most pronounced at C5-6. Scattered dental caries noted. Other neck: No other acute finding. Upper chest: No other acute finding. Review of the MIP images confirms the above findings CTA HEAD FINDINGS Anterior circulation: Calcified atherosclerosis present about the carotid siphons bilaterally with estimated 50-60% stenosis bilaterally, similar to prior. A1 segments patent bilaterally, with the left being dominant. Normal anterior communicating artery complex. Atheromatous irregularity throughout the ACAs without high-grade stenosis. Atheromatous irregularity about the M1 segments without high-grade stenosis. No proximal MCA branch occlusion. Distal small vessel atheromatous irregularity throughout the MCA branches bilaterally. Appearance is relatively similar. Posterior circulation: Hypoplastic right vertebral artery largely terminates in PICA. Distal right V4 segment minimally opacified with subsequent occlusion prior to the vertebrobasilar junction. Atherosclerotic change about the dominant left V4 segment with moderate multifocal stenoses, similar. Both PICA remain patent. Basilar is somewhat diminutive and diffusely irregular without focal high-grade stenosis. Superior cerebral arteries patent bilaterally. Atherosclerotic change about both PCAs which are irregular and attenuated in appearance. Moderate to severe stenoses noted at the left P2 segment and proximal right P1/P2 segments. PCAs remain patent to their distal  aspects. Appearance is similar. Venous sinuses: Patent allowing for timing the contrast bolus. Anatomic variants: As above.  No aneurysm. Review of the MIP images confirms the above findings IMPRESSION: CT HEAD: 1. No acute intracranial abnormality. 2. Chronic bilateral PCA distribution infarcts. 3. Underlying chronic microvascular ischemic disease with a few remote lacunar infarcts about the bilateral basal ganglia/corona radiata. CTA HEAD AND NECK: 1. Negative CTA for large vessel occlusion or other emergent finding. 2. Diffuse intracranial atherosclerotic disease as detailed above, overall similar as compared to prior CTA from 03/26/2022. 3. 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 Irregular intermittent opacification within the neck, but patent at the skull base, likely from collateral flow. Right vertebral artery functionally terminates in PICA. Moderate multifocal stenoses about the contralateral dominant left V4 segment. 4.  Aortic Atherosclerosis (ICD10-I70.0). Electronically Signed   By: Rise Mu M.D.   On: 04/12/2023 20:45     PHYSICAL EXAM  Temp:  [97.5 F (36.4 C)-98.8 F (37.1 C)] 98.2 F (36.8 C) (02/28 1225) Pulse Rate:  [61-78] 73 (02/28 1225) Resp:  [16-20] 17 (02/28 1225) BP: (157-205)/(77-90) 161/77 (02/28 1225) SpO2:  [96 %-100 %] 99 % (02/28 1225)  General - sitting in chair, Well nourished, well developed, in no apparent distress. Cardiovascular - Regular rhythm and rate.  Neuro - Aox3, follows all commands, good attention and memory, naming and repetition intact.  No dysarthria or aphasia present.  No gaze preference, tracks examiner bilaterally, upper quadrantanopia present on visual field testing. No facial droop Some very mild weakness to left upper extremity Chronic decreased sensation to right leg due to neuropathy per patient Newly decreased sensation to left arm and left face,improved since yesterday per patient. No ataxia or  tremors   ASSESSMENT/PLAN Ms. Tami Jones is a 68 y.o. female with history of diabetes, hypertension, hyperlipidemia, anxiety, CKD 3, strokes admitted for left-sided weakness, left facial droop. No TNK given due to outside window.    Stroke:  right MCA, MCA/PCA and MCA/ACA cortical and subcortical infarcts, embolic pattern concerning for cardioembolic source vs. R MCA stenosis CT no acute abnormality, chronic bilateral  PCA distribution infarcts. MRI patchy acute right frontal parietal infarcts, and punctate acute right occipital infarct.  Chronic bilateral PCA infarcts. CT head and neck bilateral ICA bulb atherosclerosis, left more than right.  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  Left V4 moderate stenosis, bilateral ICA siphon stenosis left more than right, right M1 moderate to severe stenosis, left P2, right P1/P2 moderate severe stenosis. 2D Echo EF 60 to 65% LE venous Doppler no DVT Loop recorder placed before discharge LDL 134 HgbA1c 6.8 UDS negative Lovenox for VTE prophylaxis aspirin 81 mg daily and clopidogrel 75 mg daily prior to admission, now continued on aspirin 81 mg daily and clopidogrel 75 mg daily.  Patient counseled to be compliant with her antithrombotic medications Ongoing aggressive stroke risk factor management Therapy recommendations: Home health PT Disposition: Discharge home 2/28  History of stroke 2005 stroke with decreased peripheral vision - ?  Occipital infarcts 06/2014 admitted for tunnel vision, nausea.  BP 120s.  MRI negative for stroke 07/2018 right upper extremity weakness and numbness, MRI showed left CR lacunar infarct, discharged on aspirin 325.  Residual right-sided weakness. 05/2019 admitted for slurred speech and right facial droop.  MRI left MCA 3 scattered infarcts.  Carotid Doppler unremarkable. 08/2019 admitted for altered mental status and generalized weakness.  MRI no acute stroke.  CTA showed right P1/P2, left P2, right A1 severe  stenosis 03/2022 admitted for hypertensive urgency, dysphasia.  CT old bilateral occipital infarcts.  MRI no acute infarct, old bilateral PCA infarcts.  CT head and neck right A1, left P2, right P1/P2 severe stenosis.  EF 55 to 60%.  Discharged on DAPT and Lipitor.  Palpitation  Admitted intermittent palpitation Complaint sometimes at night heart palpitation for 5 to 10 minutes in bed Loop recorder before discharge  Diabetes, controlled HgbA1c 6.8 goal < 7.0 CBG monitoring SSI DM education and close PCP follow up  Hypertension Stable on the high end On home meds metoprolol XL 25 Long term BP goal normotensive  Hyperlipidemia Home meds: Lipitor 80 LDL 134, goal < 70 Now on Lipitor 80, add Zetia 10 Start form signed for leqvio, will be submitted Continue statin at discharge  Other Stroke Risk Factors Advanced age Obesity, Body mass index is 37.73 kg/m.   Other Active Problems Anxiety CKD 3A, creatinine 1.40--1.19--1.23  Hospital day # 2   Pt seen by Neuro NP/APP and later by MD. Note/plan to be edited by MD as needed.    Lynnae January, DNP, AGACNP-BC [facility] Please use AMION for contact information & EPIC for messaging.  ATTENDING NOTE: I reviewed above note and agree with the assessment and plan. Pt was seen and examined.   No acute event overnight.  Neuro stable.  Loop recorder will be placed before discharge.  Continue DAPT and statin.  Will request Leqvio.  Follow-up at [facility] in 4 to 6 weeks.  PT and OT recommend home health.  For detailed assessment and plan, please refer to above/below as I have made changes wherever appropriate.   Neurology will sign off. Please call with questions. Pt will follow up with stroke clinic NP at [facility] in about 4-6 weeks. Thanks for the consult.   Marvel Plan, MD PhD Stroke Neurology 04/15/2023 6:38 PM   To contact Stroke Continuity provider, please refer to [email or web address]. After hours, contact General Neurology

## 2023-04-15 NOTE — Progress Notes (Signed)
 Occupational Therapy Treatment Patient Details Name: Tami Jones MRN: [id number] DOB: [birth year] Today's Date: 04/15/2023   History of present illness Pt is a 68 yo female presenting to [facility] 2/26 for evaluation of L sided weakness. MRI revealed acute R MCA infarcts and punctate R occipital infarcts. PMH of CVA with R sided weakness, DM II, HTN, HLD, anxiety, depression and GERD.   OT comments  Pt assessed for ability to manage her own medications, failed medicog assessment so we discussed having her daughter setup her pillbox at [place]. Pt now understanding that she cannot drive, she attributes this to her L sided deficits. Educated pt on [facility] exercises to perform at home, provided handout. Pt remains appropriate for [facility] services, OT to continue to progress pt as able.       If plan is discharge home, recommend the following:  Supervision due to cognitive status;Direct supervision/assist for financial management;Direct supervision/assist for medications management;A little help with walking and/or transfers;Assistance with cooking/housework   Equipment Recommendations  Tub/shower seat    Recommendations for Other Services      Precautions / Restrictions Precautions Precautions: Fall Recall of Precautions/Restrictions: Impaired Precaution/Restrictions Comments: reports fall last week over laundry on floor       Mobility Bed Mobility               General bed mobility comments: up in recliner on arrival    Transfers Overall transfer level: Needs assistance Equipment used: Rolling walker (2 wheels) Transfers: Sit to/from Stand Sit to Stand: Contact guard assist                 Balance Overall balance assessment: Needs assistance Sitting-balance support: Feet supported, Bilateral upper extremity supported, No upper extremity supported Sitting balance-Leahy Scale: Fair Sitting balance - Comments: in recliner   Standing balance support: Bilateral upper extremity  supported, Single extremity supported, During functional activity, Reliant on assistive device for balance Standing balance-Leahy Scale: Poor Standing balance comment: requires at least single UE support for dynamic balance                           ADL either performed or assessed with clinical judgement   ADL Overall ADL's : Needs assistance/impaired     Grooming: Standing;Wash/dry hands;Contact guard assist                   Toilet Transfer: Rolling walker (2 wheels);Contact guard assist   Toileting- Clothing Manipulation and Hygiene: Contact guard assist;Sitting/lateral lean       Functional mobility during ADLs: Rolling walker (2 wheels);Contact guard assist      Extremity/Trunk Assessment Upper Extremity Assessment LUE Deficits / Details: Shoulder flexion AROM 40*, weak gross grasp but can grip RW. L fingertips numb. impaired FMC LUE Sensation: decreased light touch;history of peripheral neuropathy LUE Coordination: decreased fine motor;decreased gross motor            Diplomatic Services operational officer Communication Communication: No apparent difficulties   Cognition Arousal: Alert Behavior During Therapy: Impulsive Cognition: Cognition impaired     Awareness: Online awareness impaired, Intellectual awareness intact   Attention impairment (select first level of impairment): Sustained attention Executive functioning impairment (select all impairments): Problem solving OT - Cognition Comments: administered medi cog assesment, pt scored 6/10, missed one on the word recall section but missed the rest of the questions on her medication transfer  log. Pt having trouble labeling the # of tablets to be taken on the designated days and not fully filling out medications for all days listed in instructions. Discussed having her daughter setup her medications in a pill box at home.                 Following commands: Intact         Cueing   Cueing Techniques: Verbal cues  Exercises Other Exercises Other Exercises: LUE screwing and unscrewing caps, opening lids Other Exercises: LUE grasp and release of small caps in to container    Shoulder Instructions       General Comments      Pertinent Vitals/ Pain       Pain Assessment Pain Assessment: No/denies pain  Home Living                                          Prior Functioning/Environment              Frequency  Min 2X/week        Progress Toward Goals  OT Goals(current goals can now be found in the care plan section)  Progress towards OT goals: Progressing toward goals  Acute Rehab OT Goals Patient Stated Goal: To go home OT Goal Formulation: With patient Time For Goal Achievement: 04/28/23 Potential to Achieve Goals: Good  Plan      Co-evaluation                 AM-PAC OT "6 Clicks" Daily Activity     Outcome Measure   Help from another person eating meals?: None Help from another person taking care of personal grooming?: A Little Help from another person toileting, which includes using toliet, bedpan, or urinal?: A Little Help from another person bathing (including washing, rinsing, drying)?: A Little Help from another person to put on and taking off regular upper body clothing?: A Little Help from another person to put on and taking off regular lower body clothing?: A Little 6 Click Score: 19    End of Session Equipment Utilized During Treatment: Gait belt;Rolling walker (2 wheels)  OT Visit Diagnosis: Unsteadiness on feet (R26.81);Muscle weakness (generalized) (M62.81);Hemiplegia and hemiparesis Hemiplegia - Right/Left: Left Hemiplegia - dominant/non-dominant: Non-Dominant Hemiplegia - caused by: Cerebral infarction   Activity Tolerance Patient tolerated treatment well   Patient Left with call bell/phone within reach;in chair;with chair alarm set   Nurse Communication Mobility status         Time: 9629-5284 OT Time Calculation (min): 33 min  Charges: OT General Charges $OT Visit: 1 Visit OT Treatments $Therapeutic Activity: 23-37 mins  04/15/2023  AB, OTR/L  Acute Rehabilitation Services  Office: [phone]   Tristan Schroeder 04/15/2023, 12:34 PM

## 2023-04-15 NOTE — Plan of Care (Signed)

## 2023-04-15 NOTE — Care Management Important Message (Signed)
 Important Message  Patient Details  Name: Tami Jones MRN: [id number] Date of Birth: October 30, 1955   Important Message Given:  Yes - Medicare IM     Dorena Bodo 04/15/2023, 3:36 PM

## 2023-04-15 NOTE — Progress Notes (Signed)
 Physical Therapy Treatment Patient Details Name: Tami Jones MRN: [id number] DOB: March 31, 1955 Today's Date: 04/15/2023   History of Present Illness Pt is a 68 yo female presenting to [facility] 2/26 for evaluation of L sided weakness. MRI revealed acute R MCA infarcts and punctate R occipital infarcts. PMH of CVA with R sided weakness, DM II, HTN, HLD, anxiety, depression and GERD.    PT Comments  Pt and RN both report increased BP this morning with anxiety about procedure and hospitalization. Serial BP measurements below. Pt reports feeling better with only a minor headache, but wants to get out of bed as it is saturated in urine. Pt is able to come to EoB with mod I, and is contact guard for transfers with RW. Once up in front of recliner pt is able to perform self pericare, in standing with single UE support. Pt is making progress towards her goals and exhibiting improved safety awareness today. D/c plans remain appropriate. PT will continue to follow acutely.   Orthostatic BPs  Supine 176/82  Sitting EoB 134/110  Immediately after transfer to sitting in recliner 179/94  Sitting in recliner 3 min 153/87       If plan is discharge home, recommend the following: A little help with walking and/or transfers;A little help with bathing/dressing/bathroom;Assistance with cooking/housework;Assist for transportation;Help with stairs or ramp for entrance   Can travel by private vehicle      Yes  Equipment Recommendations  None recommended by PT       Precautions / Restrictions Precautions Precautions: Fall Precaution/Restrictions Comments: reports fall last week over laundry on floor Restrictions Weight Bearing Restrictions Per Provider Order: No     Mobility  Bed Mobility Overal bed mobility: Needs Assistance Bed Mobility: Supine to Sit     Supine to sit: Used rails, HOB elevated, Modified independent (Device/Increase time)     General bed mobility comments: increased time and effort  but able to come to EoB with increased time and effort    Transfers Overall transfer level: Needs assistance Equipment used: Rolling walker (2 wheels) Transfers: Sit to/from Stand, Bed to chair/wheelchair/BSC Sit to Stand: Contact guard assist   Step pivot transfers: Contact guard assist       General transfer comment: pt able to initiate power up and self steady in RW at a contact guard level and perform step pivot transfer to in front of recliner.          Balance Overall balance assessment: Needs assistance Sitting-balance support: Feet supported, Bilateral upper extremity supported, No upper extremity supported Sitting balance-Leahy Scale: Fair     Standing balance support: During functional activity, Reliant on assistive device for balance, Single extremity supported Standing balance-Leahy Scale: Poor Standing balance comment: pt able to perform self pericare in standing with single UE support on RW                            Communication Communication Communication: No apparent difficulties  Cognition Arousal: Alert Behavior During Therapy: WFL for tasks assessed/performed   PT - Cognitive impairments: Safety/Judgement                       PT - Cognition Comments: pt with better awareness of deficits and safety Following commands: Impaired, Intact Following commands impaired: Follows one step commands inconsistently    Cueing Cueing Techniques: Verbal cues, Tactile cues, Visual cues     General Comments General comments (  skin integrity, edema, etc.): High BP this morning. RN askes PT to defer if BP remains elevated. Noted to have increased anxiety about upcoming procedure      Pertinent Vitals/Pain Pain Assessment Pain Assessment: 0-10 Pain Score: 4  Pain Location: headache Pain Descriptors / Indicators: Grimacing, Guarding, Headache Pain Intervention(s): Limited activity within patient's tolerance, Monitored during session,  Repositioned     PT Goals (current goals can now be found in the care plan section) Acute Rehab PT Goals PT Goal Formulation: With patient Time For Goal Achievement: 04/28/23 Potential to Achieve Goals: Fair Progress towards PT goals: Progressing toward goals    Frequency    Min 1X/week       AM-PAC PT "6 Clicks" Mobility   Outcome Measure  Help needed turning from your back to your side while in a flat bed without using bedrails?: None Help needed moving from lying on your back to sitting on the side of a flat bed without using bedrails?: A Little Help needed moving to and from a bed to a chair (including a wheelchair)?: A Little Help needed standing up from a chair using your arms (e.g., wheelchair or bedside chair)?: A Little Help needed to walk in hospital room?: A Little Help needed climbing 3-5 steps with a railing? : Total 6 Click Score: 17    End of Session Equipment Utilized During Treatment: Gait belt Activity Tolerance: Patient tolerated treatment well Patient left: in chair;with call bell/phone within reach;with chair alarm set Nurse Communication: Mobility status PT Visit Diagnosis: Unsteadiness on feet (R26.81);Other abnormalities of gait and mobility (R26.89);History of falling (Z91.81);Muscle weakness (generalized) (M62.81);Other symptoms and signs involving the nervous system (R29.898)     Time: 7322-0254 PT Time Calculation (min) (ACUTE ONLY): 36 min  Charges:    $Therapeutic Activity: 8-22 mins $Neuromuscular Re-education: 8-22 mins PT General Charges $$ ACUTE PT VISIT: 1 Visit                     Janalynn Eder B. Beverely Risen PT, DPT Acute Rehabilitation Services Please use secure chat or  Call Office [phone]    Elon Alas [facility] 04/15/2023, 2:58 PM

## 2023-04-15 NOTE — TOC Transition Note (Signed)
 Transition of Care [facility]) - Discharge Note   Patient Details  Name: Tami Jones MRN: [id number] Date of Birth: 1955-09-10  Transition of Care [facility]) CM/SW Contact:  Kermit Balo, RN Phone Number: 04/15/2023, 11:23 AM   Clinical Narrative:     Pt is discharging home with home health services through [facility]. Information on the AVS. Frances Furbish will contact her for the first home visit. No new DME needs.  Pt has transportation home today.  Final next level of care: Home w Home Health Services Barriers to Discharge: No Barriers Identified   Patient Goals and CMS Choice   CMS Medicare.gov Compare Post Acute Care list provided to:: Patient Choice offered to / list presented to : Patient      Discharge Placement                       Discharge Plan and Services Additional resources added to the After Visit Summary for     Discharge Planning Services: CM Consult Post Acute Care Choice: Home Health                    HH Arranged: PT, OT [facility] Agency: [facility] Date [facility] Agency Contacted: 04/14/23   Representative spoke with at [facility] Agency: Kandee Keen  Social Drivers of Health (SDOH) Interventions SDOH Screenings   Food Insecurity: No Food Insecurity (04/14/2023)  Housing: Low Risk  (04/14/2023)  Transportation Needs: No Transportation Needs (04/14/2023)  Utilities: Not At Risk (04/14/2023)  Alcohol Screen: Low Risk  (05/05/2022)  Depression (PHQ2-9): Medium Risk (01/17/2023)  Financial Resource Strain: High Risk (05/05/2022)  Physical Activity: Insufficiently Active (05/05/2022)  Social Connections: Socially Isolated (04/14/2023)  Stress: Stress Concern Present (05/05/2022)  Tobacco Use: Low Risk  (04/12/2023)     Readmission Risk Interventions     No data to display

## 2023-04-15 NOTE — Discharge Instructions (Addendum)

## 2023-04-15 NOTE — Discharge Summary (Signed)
 Physician Discharge Summary  Tami Jones XBJ:[id number] DOB: 11-30-1955 DOA: 04/12/2023  PCP: Karie Schwalbe, MD  Admit date: 04/12/2023 Discharge date: 04/15/2023  Time spent: 24 minutes  Recommendations for Outpatient Follow-up:  Note addition of Zetia and increase of metoprolol XL to 50 mg this hospital stay--will need close PCP follow-up for adjustment of blood pressure meds Requires Chem-7 CBC in about 1 week Recommend outpatient [facility]/neurology follow-up in 6 to 8 weeks Please consider referral to lipidology for [facility] 9/other agents Would refer to psychiatry given her anxiety  Discharge Diagnoses:  MAIN problem for hospitalization   Acute frontoparietal strokes  Please see below for itemized issues addressed in HOps[facility]- refer to other progress notes for clarity if needed  Discharge Condition: Improved  Diet recommendation: Heart healthy diet hepatic  Filed Weights   04/13/23 0532  Weight: 96.6 kg    History of present illness:  68 year old white female Uncontrolled HTN asthma reflux HLD DM 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  Neuropathy 2/25 called OV woke up with right-sided residual deficits pulling sensation left side of face and neck--present [place] Noticed she was wobbly while walking running into walls persistently weak etc.--- also reported 2 falls in the last week where striking her head CT head negative for large vessel occlusion emergent finding no acute intracranial abnormality chronic bilateral PCA distribution infarcts underlying chronic microvascular ischemic disease with few remote lacunar infarctions-hypoplastic right vertebral artery occluded at origin MRI brain patchy acute right frontoparietal infarcts punctate acute right occipital infarct moderate chronic small vessel ischemic disease MRI cervical spine cervical spondylosis C5-C6 with  moderate canal stenosis Sodium 142 potassium 3.7 BUN/creatinine 20/1.4 LFTs normal WBC 7.2 hemoglobin 13.9 platelet 236 UA   alcohol and drug screen negative, HIV nonreactive LDL 134 total cholesterol 308   Plan   Acute right frontoparietal infarcts, acute right occipital infarct Previous multiple CVA/TIA as above Permissive hypertension but will control a little bit more aggressively as per below Cardiology contacted and will evaluate for loop recorder prior to discharge Atorvastatin 80,  Prior to admission was on Plavix 75 aspirin 81 and will continue the same May be a candidate for Repatha like drugs in the outpatient setting Patient will discharge with home health PT OT as has been worked up and cleared by them for assistance etc. at home Uncontrolled hypertension Permissive hypertension as above-intolerant previously to various meds as per office notes-lisinopril held previously and this limits her meds Continues on amlodipine 10, Toprol-XL 25 was increased this admission to 50 mg I would recommend that PCP follow-up with her closely and adjust meds going forward-CC him DM TY 2-await A1c Resume Glucotrol 5 twice daily, continue sliding scale for now  CBG overall below 140 May need to readd in the outpatient setting gabapentin 300 3 times daily-was not taking at home Underlying anxiety Continue Xanax 0.125 at bedtime as needed With suggest outpatient follow-up with psychiatry going forward to assist with the same    Discharge Exam: Vitals:   04/15/23 0810 04/15/23 1001  BP: (!) 159/77 (!) 205/90  Pulse: 65 78  Resp: 18   Temp: 97.6 F (36.4 C)   SpO2: 100% 100%    Subj on day of d/c   Anxious had elevated blood pressure felt a little bit of blurred vision She is still somewhat weak on her left upper extremity Overall she seems relatively unchanged compared  to prior No chest pain  General Exam on discharge  EOMI NCAT left upper extremity weakness power 5/5 grossly  although limited more on left Reflexes deferred S1-S2 no murmur no rub no gallop ROM grossly intact Chest is clear no wheeze rales rhonchi  Discharge Instructions   Discharge Instructions     Diet - low sodium heart healthy   Complete by: As directed    Discharge instructions   Complete by: As directed    You were diagnosed with a recurrent stroke and this is likely secondary to atherosclerosis and modifiable risk factors Before you leave the hospital a loop recorder will be placed to determine if there are other causes of your stroke Continue your aspirin Plavix Because your blood pressure was elevated I have increased your metoprolol XL 50 mg daily and you have several intolerances to other meds such as lisinopril so you will need follow-up in the outpatient setting with your primary physician to discuss addition of meds We have added Zetia which is another cholesterol medication to reduce your risk factors a little bit more and you should follow-up to get cholesterol levels as well as sugar levels performed in the outpatient setting in 2 to 3 months Best of luck   Increase activity slowly   Complete by: As directed       Allergies as of 04/15/2023       Reactions   Citalopram Other (See Comments)   Feels odd   Doxycycline Hyclate Nausea And Vomiting   Erythromycin Other (See Comments)   Irritated stomach   Montelukast Sodium Itching   Nitrofurantoin Nausea And Vomiting   Sulfa Antibiotics Nausea And Vomiting   Tramadol Hcl Other (See Comments)   Feels odd   Venlafaxine Hcl Other (See Comments)   Feels odd   Amoxicillin-pot Clavulanate Nausea And Vomiting   Clarithromycin Rash    See ER note 02/2017        Medication List     STOP taking these medications    gabapentin 300 MG capsule Commonly known as: NEURONTIN   lisinopril 10 MG tablet Commonly known as: ZESTRIL       TAKE these medications    acetaminophen 325 MG tablet Commonly known as:  TYLENOL Take 325 mg by mouth as needed for mild pain (pain score 1-3) or moderate pain (pain score 4-6).   ALPRAZolam 0.25 MG tablet Commonly known as: XANAX Take 1 tablet (0.25 mg total) by mouth 3 (three) times daily as needed for anxiety or sleep. What changed:  how much to take when to take this   amLODipine 10 MG tablet Commonly known as: NORVASC Take 1 tablet (10 mg total) by mouth daily. What changed: when to take this   aspirin EC 81 MG tablet Take 81 mg by mouth every evening.   atorvastatin 80 MG tablet Commonly known as: LIPITOR TAKE 1 TABLET BY MOUTH EVERY EVENING   clopidogrel 75 MG tablet Commonly known as: PLAVIX TAKE 1 TABLET BY MOUTH EVERY DAY   ezetimibe 10 MG tablet Commonly known as: ZETIA Take 1 tablet (10 mg total) by mouth daily. Start taking on: April 16, 2023   glipiZIDE 5 MG 24 hr tablet Commonly known as: GLUCOTROL XL TAKE 1 TABLET (5 MG TOTAL) BY MOUTH IN THE MORNING AND AT BEDTIME   glucose blood test strip Use daily as instructed   ketoconazole 2 % cream Commonly known as: NIZORAL Apply 1 Application topically 2 (two) times daily as needed for irritation.  What changed: when to take this   lidocaine 5 % ointment Commonly known as: XYLOCAINE Apply 1 Application topically as needed.   metoprolol succinate 25 MG 24 hr tablet Commonly known as: TOPROL-XL Take 2 tablets (50 mg total) by mouth daily. What changed: how much to take   pantoprazole 40 MG tablet Commonly known as: PROTONIX Take 40 mg by mouth daily as needed (heartburn).   TUMS PO Take 2 tablets by mouth daily as needed (heartburn).       Allergies  Allergen Reactions   Citalopram Other (See Comments)    Feels odd   Doxycycline Hyclate Nausea And Vomiting   Erythromycin Other (See Comments)    Irritated stomach   Montelukast Sodium Itching   Nitrofurantoin Nausea And Vomiting   Sulfa Antibiotics Nausea And Vomiting   Tramadol Hcl Other (See Comments)    Feels  odd   Venlafaxine Hcl Other (See Comments)    Feels odd   Amoxicillin-Pot Clavulanate Nausea And Vomiting   Clarithromycin Rash     See ER note 02/2017    Follow-up Information     [facility] Follow [facility].   Specialty: Home Health Services Why: The home health agency will contact you for the first home visit Contact information: [address] [place] [address] [phone]                  The results of significant diagnostics from this hospitalization (including imaging, microbiology, ancillary and laboratory) are listed below for reference.    Significant Diagnostic Studies: VAS [place] LOWER EXTREMITY VENOUS (DVT) Result Date: 04/15/2023  Lower Venous DVT Study Patient Name:  Tami Jones  Date of Exam:   04/14/2023 Medical Rec #: [id number]       Accession #:    [id number] Date of Birth: [birth year]       Patient Gender: F Patient Age:   79 years Exam Location:  [facility] Procedure:      VAS [place] LOWER EXTREMITY VENOUS (DVT) Referring Phys: Scheryl Marten XU --------------------------------------------------------------------------------  Indications: Stroke.  Risk Factors: None identified. Limitations: Poor ultrasound/tissue interface and patient pain tolerance. Comparison Study: No prior studies. Performing Technologist: Chanda Busing RVT  Examination Guidelines: A complete evaluation includes B-mode imaging, spectral Doppler, color Doppler, and power Doppler as needed of all accessible portions of each vessel. Bilateral testing is considered an integral part of a complete examination. Limited examinations for reoccurring indications may be performed as noted. The reflux portion of the exam is performed with the patient in reverse Trendelenburg.  +---------+---------------+---------+-----------+----------+--------------+ RIGHT    CompressibilityPhasicitySpontaneityPropertiesThrombus Aging  +---------+---------------+---------+-----------+----------+--------------+ CFV      Full           Yes      Yes                                 +---------+---------------+---------+-----------+----------+--------------+ SFJ      Full                                                        +---------+---------------+---------+-----------+----------+--------------+ FV Prox  Full                                                        +---------+---------------+---------+-----------+----------+--------------+  FV Mid   Full                                                        +---------+---------------+---------+-----------+----------+--------------+ FV DistalFull                                                        +---------+---------------+---------+-----------+----------+--------------+ PFV      Full                                                        +---------+---------------+---------+-----------+----------+--------------+ POP      Full           Yes      Yes                                 +---------+---------------+---------+-----------+----------+--------------+ PTV      Full                                                        +---------+---------------+---------+-----------+----------+--------------+ PERO     Full                                                        +---------+---------------+---------+-----------+----------+--------------+   +---------+---------------+---------+-----------+----------+--------------+ LEFT     CompressibilityPhasicitySpontaneityPropertiesThrombus Aging +---------+---------------+---------+-----------+----------+--------------+ CFV      Full           Yes      Yes                                 +---------+---------------+---------+-----------+----------+--------------+ SFJ      Full                                                         +---------+---------------+---------+-----------+----------+--------------+ FV Prox  Full                                                        +---------+---------------+---------+-----------+----------+--------------+ FV Mid   Full                                                        +---------+---------------+---------+-----------+----------+--------------+  FV Distal               Yes      Yes                                 +---------+---------------+---------+-----------+----------+--------------+ PFV      Full                                                        +---------+---------------+---------+-----------+----------+--------------+ POP      Full           Yes      Yes                                 +---------+---------------+---------+-----------+----------+--------------+ PTV      Full                                                        +---------+---------------+---------+-----------+----------+--------------+ PERO     Full                                                        +---------+---------------+---------+-----------+----------+--------------+     Summary: RIGHT: - There is no evidence of deep vein thrombosis in the lower extremity.  - No cystic structure found in the popliteal fossa.  LEFT: - There is no evidence of deep vein thrombosis in the lower extremity. However, portions of this examination were limited- see technologist comments above.  - No cystic structure found in the popliteal fossa.  *See table(s) above for measurements and observations. Electronically signed by Carolynn Sayers on 04/15/2023 at 7:13:09 AM.    Final    ECHOCARDIOGRAM COMPLETE BUBBLE STUDY Result Date: 04/13/2023    ECHOCARDIOGRAM REPORT   Patient Name:   Tami Jones Date of Exam: 04/13/2023 Medical Rec #:  [id number]      Height:       63.0 in Accession #:    [id number]     Weight:       213.0 lb Date of Birth:  1955-12-25      BSA:          1.986 m Patient  Age:    67 years       BP:           208/67 mmHg Patient Gender: F              HR:           66 bpm. Exam Location:  Inpatient Procedure: 2D Echo, Cardiac Doppler, Color Doppler, Saline Contrast Bubble Study            and Intracardiac Opacification Agent (Both Spectral and Color Flow            Doppler were utilized during procedure). Indications:    Stroke  History:        Patient has prior  history of Echocardiogram examinations, most                 recent 11/25/2022. Risk Factors:Hypertension, Diabetes and                 Dyslipidemia.  Sonographer:    Karma Ganja Referring Phys: [id number] PROSPER M AMPONSAH  Sonographer Comments: Technically difficult study due to poor echo windows and patient is obese. Image acquisition challenging due to patient body habitus. IMPRESSIONS  1. Left ventricular ejection fraction, by estimation, is 60 to 65%. The left ventricle has normal function. The left ventricle has no regional wall motion abnormalities. There is mild concentric left ventricular hypertrophy. Left ventricular diastolic parameters are consistent with Grade I diastolic dysfunction (impaired relaxation).  2. Right ventricular systolic function is normal. The right ventricular size is normal. There is normal pulmonary artery systolic pressure.  3. Left atrial size was mildly dilated.  4. Bubble study low quality due to body habitus but appears negative. Agitated saline contrast bubble study was negative, with no evidence of any interatrial shunt.  5. The mitral valve is normal in structure. Trivial mitral valve regurgitation. No evidence of mitral stenosis.  6. The aortic valve is tricuspid. There is mild calcification of the aortic valve. Aortic valve regurgitation is not visualized. Aortic valve sclerosis/calcification is present, without any evidence of aortic stenosis. FINDINGS  Left Ventricle: Left ventricular ejection fraction, by estimation, is 60 to 65%. The left ventricle has normal function. The left  ventricle has no regional wall motion abnormalities. Definity contrast agent was given IV to delineate the left ventricular  endocardial borders. Strain imaging was not performed. The left ventricular internal cavity size was normal in size. There is mild concentric left ventricular hypertrophy. Left ventricular diastolic parameters are consistent with Grade I diastolic dysfunction (impaired relaxation). Right Ventricle: The right ventricular size is normal. No increase in right ventricular wall thickness. Right ventricular systolic function is normal. There is normal pulmonary artery systolic pressure. The tricuspid regurgitant velocity is 1.98 m/s, and  with an assumed right atrial pressure of 3 mmHg, the estimated right ventricular systolic pressure is 18.7 mmHg. Left Atrium: Left atrial size was mildly dilated. Right Atrium: Right atrial size was normal in size. Pericardium: There is no evidence of pericardial effusion. Mitral Valve: The mitral valve is normal in structure. Mild mitral annular calcification. Trivial mitral valve regurgitation. No evidence of mitral valve stenosis. Tricuspid Valve: The tricuspid valve is normal in structure. Tricuspid valve regurgitation is not demonstrated. No evidence of tricuspid stenosis. Aortic Valve: The aortic valve is tricuspid. There is mild calcification of the aortic valve. Aortic valve regurgitation is not visualized. Aortic valve sclerosis/calcification is present, without any evidence of aortic stenosis. Aortic valve mean gradient measures 2.0 mmHg. Aortic valve peak gradient measures 4.8 mmHg. Aortic valve area, by VTI measures 2.10 cm. Pulmonic Valve: The pulmonic valve was not well visualized. Pulmonic valve regurgitation is trivial. No evidence of pulmonic stenosis. Aorta: The aortic root is normal in size and structure. Venous: The inferior vena cava was not well visualized. IAS/Shunts: No atrial level shunt detected by color flow Doppler. Agitated saline  contrast was given intravenously to evaluate for intracardiac shunting. Agitated saline contrast bubble study was negative, with no evidence of any interatrial shunt. Additional Comments: 3D imaging was not performed.  LEFT VENTRICLE PLAX 2D LVIDd:         4.50 cm   Diastology LVIDs:         3.00  cm   LV e' medial:    4.82 cm/s LV PW:         1.10 cm   LV E/e' medial:  12.9 LV IVS:        1.10 cm   LV e' lateral:   4.97 cm/s LVOT diam:     2.10 cm   LV E/e' lateral: 12.5 LV SV:         49 LV SV Index:   25 LVOT Area:     3.46 cm  RIGHT VENTRICLE RV S prime:     14.50 cm/s TAPSE (M-mode): 2.0 cm LEFT ATRIUM             Index LA diam:        3.90 cm 1.96 cm/m LA Vol (A2C):   58.8 ml 29.61 ml/m LA Vol (A4C):   74.9 ml 37.71 ml/m LA Biplane Vol: 69.4 ml 34.94 ml/m  AORTIC VALVE AV Area (Vmax):    2.15 cm AV Area (Vmean):   1.78 cm AV Area (VTI):     2.10 cm AV Vmax:           109.00 cm/s AV Vmean:          72.400 cm/s AV VTI:            0.233 m AV Peak Grad:      4.8 mmHg AV Mean Grad:      2.0 mmHg LVOT Vmax:         67.70 cm/s LVOT Vmean:        37.200 cm/s LVOT VTI:          0.141 m LVOT/AV VTI ratio: 0.61  AORTA Ao Root diam: 3.10 cm Ao Asc diam:  2.90 cm MITRAL VALVE               TRICUSPID VALVE MV Area (PHT): 3.12 cm    TR Peak grad:   15.7 mmHg MV Decel Time: 243 msec    TR Vmax:        198.00 cm/s MV E velocity: 62.20 cm/s MV A velocity: 80.50 cm/s  SHUNTS MV E/A ratio:  0.77        Systemic VTI:  0.14 m                            Systemic Diam: 2.10 cm Arvilla Meres MD Electronically signed by Arvilla Meres MD Signature Date/Time: 04/13/2023/11:40:36 AM    Final    MR Cervical Spine Wo Contrast Result Date: 04/13/2023 CLINICAL DATA:  Ataxia, nontraumatic. Concern for cervical pathology. EXAM: MRI CERVICAL SPINE WITHOUT CONTRAST TECHNIQUE: Multiplanar, multisequence MR imaging of the cervical spine was performed. No intravenous contrast was administered. COMPARISON:  None Available. FINDINGS:  Alignment: Straightening of the normal cervical lordosis. Trace retrolisthesis of C5 on C6. Vertebrae: Degenerative endplate changes at multiple levels with mild discogenic edema at C5-6. Vertebral body heights are maintained. No evidence of acute fracture. Small hemangioma in the T1 vertebral body. Cord: Normal signal and morphology. Posterior Fossa, vertebral arteries, paraspinal tissues: Paraspinal musculature is unremarkable. Visualized posterior fossa is unremarkable. Abnormal appearance of the right vertebral artery flow void at the V2 segment which could be related to atherosclerosis. Retropharyngeal course of the carotid arteries which indents the posterior wall of the oropharynx. Disc levels: C2-3: Increased T2/stir signal in the disc likely reflecting degenerative changes. No significant disc bulge. No significant spinal canal stenosis. Bilateral facet arthrosis. No significant  foraminal stenosis. C3-4: Mild disc height loss. Disc osteophyte complex which indents the ventral thecal sac without contacting the spinal cord. Bilateral facet arthrosis and uncovertebral hypertrophy. Moderate right and mild-to-moderate left foraminal stenosis. C4-5: Disc osteophyte complex indents the ventral thecal sac without contacting the spinal cord. Bilateral facet arthrosis. Uncovertebral hypertrophy greater on the left. Mild right and moderate left foraminal stenosis. C5-6: Mild disc height loss. Disc osteophyte complex indents the ventral thecal sac with mild flattening of the ventral cervical cord. Thickening of the ligamentum flavum. There is moderate spinal canal stenosis. No cord signal abnormality. Facet arthrosis and uncovertebral hypertrophy. Mild right and moderate left foraminal stenosis. C6-7: Disc bulge indents the ventral thecal sac without contacting the spinal cord. Bilateral facet arthrosis. No significant foraminal stenosis. C7-T1: No significant spinal canal stenosis. No significant foraminal stenosis.  IMPRESSION: 1. Cervical spondylosis most significant at C5-C6 where there is moderate spinal canal stenosis and mild flattening of the ventral cord. No cord signal abnormality. 2. Moderate foraminal stenosis at multiple levels as above. 3. Abnormal appearance of the right vertebral artery V2 segment which could be related to atherosclerosis/stenosis. Consider CTA for further evaluation. Electronically Signed   By: Emily Filbert M.D.   On: 04/13/2023 09:03   MR BRAIN WO CONTRAST Result Date: 04/13/2023 CLINICAL DATA:  Neuro deficit, acute, stroke suspected. Left upper and lower extremity weakness. EXAM: MRI HEAD WITHOUT CONTRAST TECHNIQUE: Multiplanar, multiecho pulse sequences of the brain and surrounding structures were obtained without intravenous contrast. COMPARISON:  Head CT and CTA 04/12/2023 and MRI 11/24/2022 FINDINGS: Brain: Patchy acute cortical and subcortical infarcts are present in the right frontal greater than right parietal lobes, including involvement of the precentral gyrus. A few punctate acute cortical infarcts are also present in the right occipital lobe. Patchy T2 hyperintensities elsewhere in the cerebral white matter bilaterally and in the pons are similar to the prior MRI and are nonspecific but compatible with moderate chronic small vessel ischemic disease. Chronic lacunar infarcts are again noted in the basal ganglia and deep cerebral white matter bilaterally. Chronic bilateral PCA infarcts are again noted with associated chronic blood products. No age advanced global cerebral atrophy is evident. No mass, midline shift, hydrocephalus, or extra-axial fluid collection is identified. Vascular: Major intracranial vascular flow voids are preserved. Skull and upper cervical spine: Unremarkable bone marrow signal. Sinuses/Orbits: Unremarkable orbits. Paranasal sinuses and mastoid air cells are clear. Other: None. IMPRESSION: 1. Patchy acute right frontoparietal infarcts (MCA territory). 2.  Punctate acute right occipital infarcts. 3. Moderate chronic small vessel ischemic disease. Chronic bilateral PCA infarcts. Electronically Signed   By: Sebastian Ache M.D.   On: 04/13/2023 08:11   CT Head Wo Contrast Result Date: 04/12/2023 CLINICAL DATA:  Initial evaluation for neuro deficit, stroke suspected. EXAM: CT ANGIOGRAPHY HEAD AND NECK WITH AND WITHOUT CONTRAST TECHNIQUE: Multidetector CT imaging of the head and neck was performed using the standard protocol during bolus administration of intravenous contrast. Multiplanar CT image reconstructions and MIPs were obtained to evaluate the vascular anatomy. Carotid stenosis measurements (when applicable) are obtained utilizing NASCET criteria, using the distal internal carotid diameter as the denominator. RADIATION DOSE REDUCTION: This exam was performed according to the departmental dose-optimization program which includes automated exposure control, adjustment of the mA and/or kV according to patient size and/or use of iterative reconstruction technique. CONTRAST:  75mL OMNIPAQUE IOHEXOL 350 MG/ML SOLN COMPARISON:  Prior study from 11/24/2022 FINDINGS: CT HEAD FINDINGS Brain: Cerebral volume within normal limits. Sequelae of chronic microvascular  ischemic disease with a few small remote lacunar infarcts about the corona radiata/basal ganglia. Chronic bilateral PCA distribution infarcts noted. No acute intracranial hemorrhage. No visible acute cortically based infarct. No mass lesion or midline shift. No hydrocephalus or extra-axial fluid collection. Vascular: No abnormal hyperdense vessel. Skull: Scalp soft tissues within normal limits.  Calvarium intact. Sinuses/Orbits: Globes and orbital soft tissues within normal limits. Paranasal sinuses and mastoid air cells are largely clear. Other: None. Review of the MIP images confirms the above findings CTA NECK FINDINGS Aortic arch: Mild aortic atherosclerosis. Standard branching. Imaged portion shows no evidence  of aneurysm or dissection. No significant stenosis of the major arch vessel origins. Right carotid system: Right common and internal carotid arteries are patent without dissection. Mild atheromatous change about the right carotid bulb without hemodynamically significant greater than 50% stenosis. Left carotid system: Left common and internal carotid arteries are patent without dissection. Mild-to-moderate atheromatous change about the left carotid bulb without hemodynamically significant greater than 50% stenosis. Vertebral arteries: Both vertebral arteries arise from the subclavian arteries. Left vertebral artery strongly dominant and patent without stenosis or dissection. 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 Irregular attenuated intermittent opacification through the right V2 segment. Right vertebral artery is patent at the right V3 segment with subsequent wide patency as it courses into the cranial vault. Appearance is relatively similar to prior. Skeleton: No discrete or worrisome osseous lesions. Mild to moderate cervical spondylosis, most pronounced at C5-6. Scattered dental caries noted. Other neck: No other acute finding. Upper chest: No other acute finding. Review of the MIP images confirms the above findings CTA HEAD FINDINGS Anterior circulation: Calcified atherosclerosis present about the carotid siphons bilaterally with estimated 50-60% stenosis bilaterally, similar to prior. A1 segments patent bilaterally, with the left being dominant. Normal anterior communicating artery complex. Atheromatous irregularity throughout the ACAs without high-grade stenosis. Atheromatous irregularity about the M1 segments without high-grade stenosis. No proximal MCA branch occlusion. Distal small vessel atheromatous irregularity throughout the MCA branches bilaterally. Appearance is relatively similar. Posterior circulation: Hypoplastic right vertebral artery largely terminates  in PICA. Distal right V4 segment minimally opacified with subsequent occlusion prior to the vertebrobasilar junction. Atherosclerotic change about the dominant left V4 segment with moderate multifocal stenoses, similar. Both PICA remain patent. Basilar is somewhat diminutive and diffusely irregular without focal high-grade stenosis. Superior cerebral arteries patent bilaterally. Atherosclerotic change about both PCAs which are irregular and attenuated in appearance. Moderate to severe stenoses noted at the left P2 segment and proximal right P1/P2 segments. PCAs remain patent to their distal aspects. Appearance is similar. Venous sinuses: Patent allowing for timing the contrast bolus. Anatomic variants: As above.  No aneurysm. Review of the MIP images confirms the above findings IMPRESSION: CT HEAD: 1. No acute intracranial abnormality. 2. Chronic bilateral PCA distribution infarcts. 3. Underlying chronic microvascular ischemic disease with a few remote lacunar infarcts about the bilateral basal ganglia/corona radiata. CTA HEAD AND NECK: 1. Negative CTA for large vessel occlusion or other emergent finding. 2. Diffuse intracranial atherosclerotic disease as detailed above, overall similar as compared to prior CTA from 03/26/2022. 3. 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 Irregular intermittent opacification within the neck, but patent at the skull base, likely from collateral flow. Right vertebral artery functionally terminates in PICA. Moderate multifocal stenoses about the contralateral dominant left V4 segment. 4.  Aortic Atherosclerosis (ICD10-I70.0). Electronically Signed   By: Rise Mu M.D.   On: 04/12/2023 20:45   CT ANGIO  HEAD NECK W WO CM Result Date: 04/12/2023 CLINICAL DATA:  Initial evaluation for neuro deficit, stroke suspected. EXAM: CT ANGIOGRAPHY HEAD AND NECK WITH AND WITHOUT CONTRAST TECHNIQUE: Multidetector CT imaging of the head and neck was performed using the  standard protocol during bolus administration of intravenous contrast. Multiplanar CT image reconstructions and MIPs were obtained to evaluate the vascular anatomy. Carotid stenosis measurements (when applicable) are obtained utilizing NASCET criteria, using the distal internal carotid diameter as the denominator. RADIATION DOSE REDUCTION: This exam was performed according to the departmental dose-optimization program which includes automated exposure control, adjustment of the mA and/or kV according to patient size and/or use of iterative reconstruction technique. CONTRAST:  75mL OMNIPAQUE IOHEXOL 350 MG/ML SOLN COMPARISON:  Prior study from 11/24/2022 FINDINGS: CT HEAD FINDINGS Brain: Cerebral volume within normal limits. Sequelae of chronic microvascular ischemic disease with a few small remote lacunar infarcts about the corona radiata/basal ganglia. Chronic bilateral PCA distribution infarcts noted. No acute intracranial hemorrhage. No visible acute cortically based infarct. No mass lesion or midline shift. No hydrocephalus or extra-axial fluid collection. Vascular: No abnormal hyperdense vessel. Skull: Scalp soft tissues within normal limits.  Calvarium intact. Sinuses/Orbits: Globes and orbital soft tissues within normal limits. Paranasal sinuses and mastoid air cells are largely clear. Other: None. Review of the MIP images confirms the above findings CTA NECK FINDINGS Aortic arch: Mild aortic atherosclerosis. Standard branching. Imaged portion shows no evidence of aneurysm or dissection. No significant stenosis of the major arch vessel origins. Right carotid system: Right common and internal carotid arteries are patent without dissection. Mild atheromatous change about the right carotid bulb without hemodynamically significant greater than 50% stenosis. Left carotid system: Left common and internal carotid arteries are patent without dissection. Mild-to-moderate atheromatous change about the left carotid bulb  without hemodynamically significant greater than 50% stenosis. Vertebral arteries: Both vertebral arteries arise from the subclavian arteries. Left vertebral artery strongly dominant and patent without stenosis or dissection. 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 Irregular attenuated intermittent opacification through the right V2 segment. Right vertebral artery is patent at the right V3 segment with subsequent wide patency as it courses into the cranial vault. Appearance is relatively similar to prior. Skeleton: No discrete or worrisome osseous lesions. Mild to moderate cervical spondylosis, most pronounced at C5-6. Scattered dental caries noted. Other neck: No other acute finding. Upper chest: No other acute finding. Review of the MIP images confirms the above findings CTA HEAD FINDINGS Anterior circulation: Calcified atherosclerosis present about the carotid siphons bilaterally with estimated 50-60% stenosis bilaterally, similar to prior. A1 segments patent bilaterally, with the left being dominant. Normal anterior communicating artery complex. Atheromatous irregularity throughout the ACAs without high-grade stenosis. Atheromatous irregularity about the M1 segments without high-grade stenosis. No proximal MCA branch occlusion. Distal small vessel atheromatous irregularity throughout the MCA branches bilaterally. Appearance is relatively similar. Posterior circulation: Hypoplastic right vertebral artery largely terminates in PICA. Distal right V4 segment minimally opacified with subsequent occlusion prior to the vertebrobasilar junction. Atherosclerotic change about the dominant left V4 segment with moderate multifocal stenoses, similar. Both PICA remain patent. Basilar is somewhat diminutive and diffusely irregular without focal high-grade stenosis. Superior cerebral arteries patent bilaterally. Atherosclerotic change about both PCAs which are irregular and attenuated in  appearance. Moderate to severe stenoses noted at the left P2 segment and proximal right P1/P2 segments. PCAs remain patent to their distal aspects. Appearance is similar. Venous sinuses: Patent allowing for timing the contrast bolus. Anatomic  variants: As above.  No aneurysm. Review of the MIP images confirms the above findings IMPRESSION: CT HEAD: 1. No acute intracranial abnormality. 2. Chronic bilateral PCA distribution infarcts. 3. Underlying chronic microvascular ischemic disease with a few remote lacunar infarcts about the bilateral basal ganglia/corona radiata. CTA HEAD AND NECK: 1. Negative CTA for large vessel occlusion or other emergent finding. 2. Diffuse intracranial atherosclerotic disease as detailed above, overall similar as compared to prior CTA from 03/26/2022. 3. 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 Irregular intermittent opacification within the neck, but patent at the skull base, likely from collateral flow. Right vertebral artery functionally terminates in PICA. Moderate multifocal stenoses about the contralateral dominant left V4 segment. 4.  Aortic Atherosclerosis (ICD10-I70.0). Electronically Signed   By: Rise Mu M.D.   On: 04/12/2023 20:45    Microbiology: No results found for this or any previous visit (from the past 240 hours).   Labs: Basic Metabolic Panel: Recent Labs  Lab 04/12/23 1731 04/12/23 1828 04/14/23 0626 04/15/23 0652  NA 142 142 141 139  K 3.7 3.6 3.9 3.7  CL 107 108 107 106  CO2 22  --  25 25  GLUCOSE 108* 101* 91 90  BUN 20 22 13 17   CREATININE 1.42* 1.50* 1.19* 1.23*  CALCIUM 9.0  --  9.1 8.7*   Liver Function Tests: Recent Labs  Lab 04/12/23 1731  AST 24  ALT 23  ALKPHOS 61  BILITOT 0.6  PROT 6.4*  ALBUMIN 3.7   No results for input(s): "LIPASE", "AMYLASE" in the last 168 hours. No results for input(s): "AMMONIA" in the last 168 hours. CBC: Recent Labs  Lab 04/12/23 1731 04/12/23 1828 04/14/23 0626  04/15/23 0652  WBC 7.2  --  5.1 5.1  NEUTROABS 4.3  --   --  2.4  HGB 13.9 15.6* 13.5 13.2  HCT 41.4 46.0 39.7 38.6  MCV 93.5  --  91.5 91.9  PLT 236  --  209 212   Cardiac Enzymes: No results for input(s): "CKTOTAL", "CKMB", "CKMBINDEX", "TROPONINI" in the last 168 hours. BNP: BNP (last 3 results) No results for input(s): "BNP" in the last 8760 hours.  ProBNP (last 3 results) No results for input(s): "PROBNP" in the last 8760 hours.  CBG: Recent Labs  Lab 04/14/23 0629 04/14/23 1209 04/14/23 1614 04/14/23 2135 04/15/23 0622  GLUCAP 88 113* 130* 163* 88    Signed:  Rhetta Mura MD   [facility] 04/15/2023, 11:00 AM

## 2023-04-18 ENCOUNTER — Telehealth: Payer: Self-pay

## 2023-04-18 ENCOUNTER — Other Ambulatory Visit: Payer: Self-pay

## 2023-04-18 ENCOUNTER — Ambulatory Visit: Payer: Self-pay | Admitting: Internal Medicine

## 2023-04-18 NOTE — Telephone Encounter (Deleted)
 Answer Assessment - Initial Assessment Questions 1. SYMPTOM: "What is the main symptom you are concerned about?" (e.g., weakness, numbness)     Fatigue  Protocols used: Neurologic Deficit-A-AH

## 2023-04-18 NOTE — Transitions of Care (Post Inpatient/ED Visit) (Signed)
   04/18/2023  Name: Tami Jones MRN: [id number] DOB: 07-02-1955  Today's TOC FU Call Status: Today's TOC FU Call Status:: Unsuccessful Call (1st Attempt) Unsuccessful Call (1st Attempt) Date: 04/18/23  Attempted to reach the patient regarding the most recent Inpatient/ED visit.  Follow Up Plan: Additional outreach attempts will be made to reach the patient to complete the Transitions of Care (Post Inpatient/ED visit) call.   Deidre Ala, BSN, RN [place]  VBCI - [facility] RN Care Manager [phone]

## 2023-04-18 NOTE — Telephone Encounter (Deleted)
 Answer Assessment - Initial Assessment Questions 1. DESCRIPTION: "Describe how you are feeling."     Fatigue 2. CAUSE: "What do you think is causing the weakness or fatigue?" (e.g., not drinking enough fluids, medical problem, trouble sleeping)     Stroke or new medications  Protocols used: Weakness (Generalized) and Fatigue-A-AH

## 2023-04-18 NOTE — Telephone Encounter (Signed)
 Could be the increased metoprolol dose or just the stress with new stroke symptoms, etc We can review this at the upcoming visit in 2 days

## 2023-04-18 NOTE — Telephone Encounter (Signed)
 Chief Complaint: Medication Side Effect Symptoms: Fatigue, weakness Frequency: Constant Disposition: [] ED /[] Urgent Care (no appt availability in office) / [x] Appointment(In office/virtual)/ []  [place] [facility]/ [] Home Care/ [] Refused Recommended Disposition /[] [place] [facility]/ [x]  Follow-up with PCP Additional Notes: Pt states she was previously taking metoprolol succinate 25 mg once a day but is now taking metoprolol succinate 50 mg once a day. Pt also started taking ezetimibe 10 mg while in hospital. Pt was discharged from hospital on Fri 04/15/2023. Pt states she is very fatigued and is not sure if it is related to the medication changes or from the stroke. Pt states home PT and OT are coming tomorrow. Pt wants to scheduled an appt with Dr. Alphonsus Sias to discuss her medications. Pt scheduled for an appt on 04/20/2023. This RN educated pt on home care, new-worsening symptoms, when to call back/seek emergent care. Pt verbalized understanding and agrees to plan.     Pt also has a question for Dr. Alphonsus Sias-  Pt states on her discharge paperwork there is not a check mark beside atorvastatin 80 mg every evening. Pt wants to know if she needs to stop this medication or continue.       Copied from CRM [id number]. Topic: Clinical - Red Word Triage >> Apr 18, 2023  1:02 PM Denese Killings wrote: [facility] that prompted transfer to Nurse Triage: Patient just got out of the hospital and she recieved 50 mg of metoprolol succinate (TOPROL-XL) 25 MG 24 hr tablet She is taking two and it makes her very sleepy and fatigue. She has to stay in bed. She states that the hospital told her to take two in the morning but her prescription says take one.

## 2023-04-18 NOTE — Telephone Encounter (Signed)
 This RN made 1st attempt to reach pt, no answer, VM full.  Copied from CRM [id number]. Topic: Clinical - Medication Question >> Apr 18, 2023  1:04 PM Denese Killings wrote: Reason for CRM: Patient has questions on which medications she should be taking with metoprolol succinate (TOPROL-XL) 25 MG 24 hr tablet.  Medications is question: ezetimibe (ZETIA) 10 MG tablet,  atorvastatin (LIPITOR) 80 MG tablet, metoprolol succinate (TOPROL-XL) 25 MG 24 hr tablet

## 2023-04-18 NOTE — Telephone Encounter (Signed)
 Dr. Roda Shutters, please see denial reason below.  Auth Submission: DENIED Site of care: Site of care: [facility] Payer: [employer] medicare Medication & CPT/J Code(s) submitted: Leqvio (Inclisiran) [phone] Route of submission (phone, fax, portal): portal  Authorization has been DENIED because the patient must try Repatha or Praluent before Leqvio will be approved.

## 2023-04-19 ENCOUNTER — Telehealth: Payer: Self-pay

## 2023-04-19 DIAGNOSIS — Z48812 Encounter for surgical aftercare following surgery on the circulatory system: Secondary | ICD-10-CM | POA: Diagnosis not present

## 2023-04-19 DIAGNOSIS — I69354 Hemiplegia and hemiparesis following cerebral infarction affecting left non-dominant side: Secondary | ICD-10-CM | POA: Diagnosis not present

## 2023-04-19 DIAGNOSIS — E1122 Type 2 diabetes mellitus with diabetic chronic kidney disease: Secondary | ICD-10-CM | POA: Diagnosis not present

## 2023-04-19 DIAGNOSIS — I1310 Hypertensive heart and chronic kidney disease without heart failure, with stage 1 through stage 4 chronic kidney disease, or unspecified chronic kidney disease: Secondary | ICD-10-CM | POA: Diagnosis not present

## 2023-04-19 DIAGNOSIS — I69351 Hemiplegia and hemiparesis following cerebral infarction affecting right dominant side: Secondary | ICD-10-CM | POA: Diagnosis not present

## 2023-04-19 NOTE — Transitions of Care (Post Inpatient/ED Visit) (Signed)
   04/19/2023  Name: [name] MRN: [id number] DOB: 07-16-1955  Today's TOC FU Call Status: Today's TOC FU Call Status:: Unsuccessful Call (2nd Attempt) Unsuccessful Call (1st Attempt) Date: 04/18/23 Unsuccessful Call (2nd Attempt) Date: 04/19/23  Attempted to reach the patient regarding the most recent Inpatient/ED visit.  Follow Up Plan: Additional outreach attempts will be made to reach the patient to complete the Transitions of Care (Post Inpatient/ED visit) call.  Deidre Ala, BSN, RN [place]  VBCI - [facility] RN Care Manager [phone]

## 2023-04-20 ENCOUNTER — Inpatient Hospital Stay: Admitting: Internal Medicine

## 2023-04-20 ENCOUNTER — Telehealth: Payer: Self-pay

## 2023-04-20 DIAGNOSIS — E1122 Type 2 diabetes mellitus with diabetic chronic kidney disease: Secondary | ICD-10-CM | POA: Diagnosis not present

## 2023-04-20 DIAGNOSIS — Z48812 Encounter for surgical aftercare following surgery on the circulatory system: Secondary | ICD-10-CM | POA: Diagnosis not present

## 2023-04-20 DIAGNOSIS — I69354 Hemiplegia and hemiparesis following cerebral infarction affecting left non-dominant side: Secondary | ICD-10-CM | POA: Diagnosis not present

## 2023-04-20 DIAGNOSIS — I69351 Hemiplegia and hemiparesis following cerebral infarction affecting right dominant side: Secondary | ICD-10-CM | POA: Diagnosis not present

## 2023-04-20 DIAGNOSIS — I1310 Hypertensive heart and chronic kidney disease without heart failure, with stage 1 through stage 4 chronic kidney disease, or unspecified chronic kidney disease: Secondary | ICD-10-CM | POA: Diagnosis not present

## 2023-04-20 NOTE — Patient Outreach (Addendum)
 Care Management  Transitions of Care Program Transitions of Care Post-discharge Week #1   04/20/2023 Name: Tami Jones MRN: [id number] DOB: [birth year]  Subjective: Tami Jones is a 68 y.o. year old female who is a primary care patient of Karie Schwalbe, MD. The Care Management team Engaged with patient Engaged with patient by telephone to assess and address transitions of care needs.   Consent to Services:  Patient was given information about care management services, agreed to services, and gave verbal consent to participate.   Assessment: Patient was discharged from hospital following admission for acute frontoparietal microvessel CVA with a PMH significant for prior CVA's with residual impairments. Loop Recorder implanted to rule out any occult arrhthymias such as A-fib. Patient was tearful and emotional on the call and had many concerns about crying spells coming for no reason. Dicussed side effects of stroke on emotions, and overall health concerns. Outpatient referral for anxiety issues but patient wants to wait a bit to see if it subsides. Has a history of anxiety and feels she usually manages it "okay". Spent a good bit of time allowing patient to express feelings and concerns regarding health anxieties. Agreed to follow up calls via TOC 30 day program.    SDOH Interventions    Flowsheet Row Telephone from 04/20/2023 in [place] [facility] Office Visit from 01/17/2023 in [facility] Chronic Care Management from 05/05/2022 in [facility] [place] [facility] [place]  SDOH Interventions     Food Insecurity Interventions Intervention Not Indicated -- Intervention Not Indicated  Housing Interventions Intervention Not Indicated -- Intervention Not Indicated  Transportation Interventions Payor Benefit -- Intervention Not Indicated  Utilities Interventions -- -- Intervention Not Indicated  Alcohol Usage Interventions -- --  Intervention Not Indicated (Score <7)  Depression Interventions/Treatment  -- Counseling Counseling  [referral to LCSW also for support and education]  Financial Strain Interventions -- -- Other (Comment)  [referral to Pharm D and LCSW]  Physical Activity Interventions -- -- Intervention Not Indicated, Other (Comments)  [the patient does exercises that she was shown during PT after her stroke]  Stress Interventions -- -- Provide Counseling, Other (Comment)  [the patient gets stressed over her health, recent COVID]  Social Connections Interventions -- -- Intervention Not Indicated, Other (Comment)  [has good support from her family]      SDOH Interventions Today    Flowsheet Row Most Recent Value  SDOH Interventions   Food Insecurity Interventions Intervention Not Indicated  Housing Interventions Intervention Not Indicated  Transportation Interventions Payor Benefit  Utilities Interventions Intervention Not Indicated  Health Literacy Interventions Intervention Not Indicated        Goals Addressed             This Visit's Progress    Transitions of Care       Current Barriers:  Knowledge Deficits related to plan of care for management of Atrial Fibrillation, HTN, Anxiety, and Stroke  Care Coordination needs related to Transportation Chronic Disease Management support and education needs related to HTN, Anxiety, and Stroke   RNCM Clinical Goal(s):  Patient will work with the Care Management team over the next 30 days to address Transition of Care Barriers: Home Health services Functional/Safety Transportation Post stroke therapy and rehabilitation needs.  verbalize understanding of plan for management of Anxiety and Stroke as evidenced by considering outpatient therapy or anxiety if not abated and outpatient therapy for post stroke residual impairment post discharge from  home health therapy.  take all medications exactly as prescribed and will call provider for medication  related questions as evidenced by compliance and adherence to prescribed medication regime.  continue to work with RN Care Manager to address care management and care coordination needs related to  HTN, Anxiety, and Stroke as evidenced by adherence to CM Team Scheduled appointments through collaboration with RN Care manager, provider, and care team.   Interventions: Evaluation of current treatment plan related to  self management and patient's adherence to plan as established by provider  Transitions of Care:  New goal. Durable Medical Equipment (DME) reviewed with patient/caregiver and delivery confirmed Doctor Visits  - discussed the importance of doctor visits Communication with insurance provider benefits re: transportation to appointments and outpatient therapies, possible used iPhone for Loop Recorder portability/data uploading, and any other value based benefits available to patient.  Troubleshoot use of DME in the home  Post discharge activity limitations prescribed by provider reviewed Post-op wound/incision care reviewed with patient/caregiver Reviewed Signs and symptoms of infection  Stroke:  (Status:New goal.) Short Term Goal Reviewed Importance of taking all medications as prescribed Reviewed Importance of attending all scheduled provider appointments Advised to report any changes in symptoms or exercise tolerance Advised patient to discuss medication questions, blood pressure and blood sugar logs, loop recorder, any specialist referrals needed, insurance benefits and added value benefits with provide Assessed social determinant of health barriers Reviewed referrals to home health Reviewed the importance of exercise Assessed for fall status and safety in the home  Patient Goals/Self-Care Activities: Participate in Transition of Care Program/Attend [facility] scheduled calls Notify RN Care Manager of TOC call rescheduling needs Take all medications as prescribed Attend all scheduled  provider appointments Call pharmacy for medication refills 3-7 days in advance of running out of medications Call provider office for new concerns or questions  call the Suicide and Crisis Lifeline: 988 call 1-800-273-TALK (toll free, 24 hour hotline) Contact PCP in non emergent situations if experiencing a Mental Health or Behavioral Health Crisis   Follow Up Plan:  Telephone follow up appointment with care management team member scheduled for:  04/27/23 at 2:00 pm EST.  Follow up with provider re: To reschedule canceled appointment.  Contact [employer] insurance number given for transportation needs for appointments   Contact provider to outpatient psychosocial anxiety related needs is desires or let TOC RN CM make a referral if desired at next call.  Continue with home health physical therapy and home exercises as tolerated.  Monitor blood pressure and blood sugars and record in phone or notebook to take to providers after discussing importance of noting trends vs spot checks.  Call provider with any concerns or questions, patient understand signs and symptoms of stroke. Discuss current permissive HTN parameter sor adjustments in blood pressure mediations post discharge follow up with PCP.        Interventions Today    Flowsheet Row Most Recent Value  Chronic Disease   Chronic disease during today's visit --  [Patient has not been told of any kidney issues.]  General Interventions   General Interventions Discussed/Reviewed General Interventions Discussed, Durable Medical Equipment (DME), Doctor Visits  Doctor Visits Discussed/Reviewed Doctor Visits Discussed, PCP  Durable Medical Equipment (DME) Dan Humphreys, [employer]  PCP/Specialist Visits Compliance with follow-up visit  Exercise Interventions   Exercise Discussed/Reviewed Exercise Discussed, Weight Managment, Assistive device use and maintanence  Education Interventions   Education Provided Provided Education  Provided Verbal Education On  [employer], Medication, When to  see the doctor, Blood Sugar Monitoring, Other  [BP monitoring and keeping a log.]  Mental Health Interventions   Mental Health Discussed/Reviewed Mental Health Discussed, Coping Strategies, Anxiety, Grief and Loss, Depression  Nutrition Interventions   Nutrition Discussed/Reviewed Nutrition Discussed, Decreasing fats, Fluid intake  Pharmacy Interventions   Pharmacy Dicussed/Reviewed Pharmacy Topics Discussed, Medications and their functions, Medication Adherence  Safety Interventions   Safety Discussed/Reviewed Safety Discussed, Fall Risk      04/20/23: TOC RN CM completed post discharge TOC follow up outreach today. She lives with daughter who is SW with [facility] DSS. 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 She had a Loop Recorder placed this admission.    Patient has a PCP appointment this am but canceled due to weather issues and transportation issues today. Patient is to contact PCP for a new follow up appointment and contact [employer] transportation line to set up transportation needs, She was driving prior to admission but having some residual weakness/sensation loss.    Home Health via [facility] for PT/OT has made first visit and was coming today at 230 pm as well. Had DME RW from pta. Discussed progressing to outpatient PT after home health therapy is completed. Patient had been going to the gym regularly prior to this admission, but was concerned when she could or would be able to return safely.    Discussed monitoring blood pressure and blood sugars (once a day) and recording in a log to take to provider visits. No speciality visit follow ups noted in AVS or that patient was aware of.   Benefits Review: To check with insurance benefits for BP cuff coverage as needs a new one. She also could  use a used iPhone so that [employer] would work with phone and outside of home. She now has to be within 10 feet of recording device. Encouraged her to discuss any additional benefits offered by [facility] insurance and TOC can follow up on any needs. Contact number given from website.    Discussed and allowed patient time to express feelings about her health issues, reported crying spells "that come out of no where, fears of being a burden to family. DC Summary discussed outpatient referral for anxiety to psych consult and this was brought up to patient. She wants to wait a bit to see PCP and if it will subside as she feels she had dealt with anxiety before, had medications for it, but acknowledged that talking about things made her feel better. Potential referral for LCSW if does not feel better on follow up call and has had PCP visit to discuss issues.    Patient did wish and verbally agree to follow up calls by [facility] RN CM in the 30 day program, and is scheduled for a 2 pm call back next Wednesday 04/27/23 with patient's practice TOC RN CM. This note will be routed to that RN CM. Per encounters in Care Plan, patient was being followed by CCM in April 2024.    Plan: Telephone follow up appointment with care management team member scheduled for: 04/27/23 at 2 pm. Follow up with provider re: Scheduling PCP follow up appointment that was canceled this am.    Gabriel Cirri MSN, RN RN Case Manager [place]  [facility] Office Hours Wed/Thur  8:00 am-6:00 pm Direct Dial: [phone] Main Phone [phone]  Fax: [phone] [email or web address]

## 2023-04-20 NOTE — Transitions of Care (Post Inpatient/ED Visit) (Addendum)
 04/20/2023  Name: Tami Jones MRN: [id number] DOB: 05-Aug-1955  Today's TOC FU Call Status:   Patient's Name and Date of Birth confirmed.  Transition Care Management Follow-up Telephone Call    Items Reviewed: Did you receive and understand the discharge instructions provided?: Yes Medications obtained,verified, and reconciled?: Yes (Medications Reviewed) Any new allergies since your discharge?: No Dietary orders reviewed?: Yes Type of Diet Ordered:: Heart Health/Hepatic Do you have support at home?: Yes People in Home: child(ren), adult Name of Support/Comfort Primary Source: Cleophus Molt (Daughter)  [phone] (Mobile)  Medications Reviewed Today: Medications Reviewed Today     Reviewed by Bing Quarry, RN (Case Manager) on 04/20/23 at 1323  Med List Status: <None>   Medication Order Taking? Sig Documenting Provider Last Dose Status Informant  acetaminophen (TYLENOL) 325 MG tablet [id number] Yes Take 325 mg by mouth as needed for mild pain (pain score 1-3) or moderate pain (pain score 4-6). [provider] Taking Active Self, Pharmacy Records  ALPRAZolam Prudy Feeler) 0.25 MG tablet [id number] Yes Take 1 tablet (0.25 mg total) by mouth 3 (three) times daily as needed for anxiety or sleep.  Patient taking differently: Take 0.125 mg by mouth at bedtime as needed for anxiety or sleep.   Karie Schwalbe, MD Taking Active Self, Pharmacy Records  amLODipine [facility]) 10 MG tablet [id number] Yes Take 1 tablet (10 mg total) by mouth daily.  Patient taking differently: Take 10 mg by mouth every evening.   Karie Schwalbe, MD Taking Active Self, Pharmacy Records           Med Note (CRUTHIS, CHLOE C   Wed Apr 13, 2023 10:38 AM) Pt is adamant she is taking this medication daily and that she has an over supply at home. Dispense report does not support this claim.   aspirin EC 81 MG tablet [id number] Yes Take 81 mg by mouth every evening.  [provider] Taking  Active Self, Pharmacy Records           Med Note Alvester Morin, [place] B   Wed Apr 20, 2023  1:06 PM) Takes in the afternoon.   atorvastatin (LIPITOR) 80 MG tablet [id number] Yes TAKE 1 TABLET BY MOUTH EVERY EVENING Karie Schwalbe, MD Taking Active Self, Pharmacy Records           Med Note [place], [place] B   Wed Apr 20, 2023  1:16 PM) Mentioned  in DC Summary but not on AVS list of medications. Will check with PCP.   Calcium Carbonate Antacid (TUMS PO) [id number] Yes Take 2 tablets by mouth daily as needed (heartburn). [provider] Taking Active Self, Pharmacy Records  clopidogrel (PLAVIX) 75 MG tablet [id number] Yes TAKE 1 TABLET BY MOUTH EVERY DAY Karie Schwalbe, MD Taking Active Self, Pharmacy Records           Med Note Harlow Asa Nov 24, 2022  2:55 PM)    ezetimibe (ZETIA) 10 MG tablet [id number] Yes Take 1 tablet (10 mg total) by mouth daily. Rhetta Mura, MD Taking Active Self  glipiZIDE (GLUCOTROL XL) 5 MG 24 hr tablet [id number] Yes TAKE 1 TABLET (5 MG TOTAL) BY MOUTH IN THE MORNING AND AT BEDTIME Karie Schwalbe, MD Taking Active Self, Pharmacy Records  glucose blood test strip [id number] Yes Use daily as instructed Karie Schwalbe, MD Taking Active Self, Pharmacy Records  ketoconazole (NIZORAL) 2 % cream [id number] Yes Apply 1 Application topically 2 (two) times daily as  needed for irritation.  Patient taking differently: Apply 1 Application topically as needed for irritation.   Karie Schwalbe, MD Taking Active Self, Pharmacy Records  lidocaine (XYLOCAINE) 5 % ointment [id number] No Apply 1 Application topically as needed.  Patient not taking: Reported on 04/13/2023   Sharyn Creamer, MD Not Taking Active Self, Pharmacy Records  metoprolol succinate (TOPROL-XL) 25 MG 24 hr tablet [id number] Yes Take 2 tablets (50 mg total) by mouth daily. Rhetta Mura, MD Taking Active Self  pantoprazole (PROTONIX) 40 MG tablet [id number] Yes Take 40 mg by mouth daily as  needed (heartburn). [provider] Taking Active Self, Pharmacy Records            Home Care and Equipment/Supplies: Were Home Health Services Ordered?: Yes Name of Home Health Agency:: [facility] Has Agency set up a time to come to your home?: Yes First Home Health Visit Date: 04/19/23 Any new equipment or medical supplies ordered?: No  Functional Questionnaire: Do you need assistance with bathing/showering or dressing?: No (Not normally, is a bit wobbly, has help in the home.) Do you need assistance with meal preparation?: No Do you need assistance with eating?: No Do you have difficulty maintaining continence: No Do you need assistance with getting out of bed/getting out of a chair/moving?: No Do you have difficulty managing or taking your medications?: No  Follow up appointments reviewed: PCP Follow-up appointment confirmed?: No (Had appointment scheduled for this am but canceled due to weather and daughter was working. Was driving PTA. Wil call today to reschedule and then call [facility] transportation.) Specialist Hospital Follow-up appointment confirmed?: NA Do you need transportation to your follow-up appointment?: Yes Transportation Need Intervention Addressed By:: Other: (Gave patient [employer] transportaion contact information.) Do you understand care options if your condition(s) worsen?: Yes-patient verbalized understanding  SDOH Interventions Today  Flowsheet Row Most Recent Value  SDOH Interventions   Food Insecurity Interventions Intervention Not Indicated  Housing Interventions Intervention Not Indicated  Transportation Interventions Payor Benefit  Utilities Interventions Intervention Not Indicated  Health Literacy Interventions Intervention Not Indicated      Interventions Today    Flowsheet Row Most Recent Value  Chronic Disease   Chronic disease during today's visit --  [Patient has not been told of any kidney issues.]  General Interventions   General  Interventions Discussed/Reviewed General Interventions Discussed, Durable Medical Equipment (DME), Doctor Visits  Doctor Visits Discussed/Reviewed Doctor Visits Discussed, PCP  Durable Medical Equipment (DME) Dan Humphreys, Glucomoter  PCP/Specialist Visits Compliance with follow-up visit  Exercise Interventions   Exercise Discussed/Reviewed Exercise Discussed, Weight Managment, Assistive device use and maintanence  Education Interventions   Education Provided Provided Education  Provided Verbal Education On [employer], Medication, When to see the doctor, Blood Sugar Monitoring, Other  [BP monitoring and keeping a log.]  Mental Health Interventions   Mental Health Discussed/Reviewed Mental Health Discussed, Coping Strategies, Anxiety, Grief and Loss, Depression  Nutrition Interventions   Nutrition Discussed/Reviewed Nutrition Discussed, Decreasing fats, Fluid intake  Pharmacy Interventions   Pharmacy Dicussed/Reviewed Pharmacy Topics Discussed, Medications and their functions, Medication Adherence  Safety Interventions   Safety Discussed/Reviewed Safety Discussed, Fall Risk       TOC Interventions Today    Flowsheet Row Most Recent Value  TOC Interventions   TOC Interventions Discussed/Reviewed TOC Interventions Discussed, S/S of infection, Post op wound/incision care, Post discharge activity limitations per provider        Goals Addressed  This Visit's Progress    Transitions of Care       Current Barriers:  Knowledge Deficits related to plan of care for management of Atrial Fibrillation, HTN, Anxiety, and Stroke  Care Coordination needs related to Transportation Chronic Disease Management support and education needs related to HTN, Anxiety, and Stroke   RNCM Clinical Goal(s):  Patient will work with the Care Management team over the next 30 days to address Transition of Care Barriers: Home Health services Functional/Safety Transportation Post stroke therapy and  rehabilitation needs.  verbalize understanding of plan for management of Anxiety and Stroke as evidenced by considering outpatient therapy or anxiety if not abated and outpatient therapy for post stroke residual impairment post discharge from home health therapy.  take all medications exactly as prescribed and will call provider for medication related questions as evidenced by compliance and adherence to prescribed medication regime.  continue to work with RN Care Manager to address care management and care coordination needs related to  HTN, Anxiety, and Stroke as evidenced by adherence to CM Team Scheduled appointments through collaboration with RN Care manager, provider, and care team.   Interventions: Evaluation of current treatment plan related to  self management and patient's adherence to plan as established by provider  Transitions of Care:  New goal. Durable Medical Equipment (DME) reviewed with patient/caregiver and delivery confirmed Doctor Visits  - discussed the importance of doctor visits Communication with insurance provider benefits re: transportation to appointments and outpatient therapies, possible used iPhone for Loop Recorder portability/data uploading, and any other value based benefits available to patient.  Troubleshoot use of DME in the home  Post discharge activity limitations prescribed by provider reviewed Post-op wound/incision care reviewed with patient/caregiver Reviewed Signs and symptoms of infection  Stroke:  (Status:New goal.) Short Term Goal Reviewed Importance of taking all medications as prescribed Reviewed Importance of attending all scheduled provider appointments Advised to report any changes in symptoms or exercise tolerance Advised patient to discuss medication questions, blood pressure and blood sugar logs, loop recorder, any specialist referrals needed, insurance benefits and added value benefits with provide Assessed social determinant of health  barriers Reviewed referrals to home health Reviewed the importance of exercise Assessed for fall status and safety in the home  Patient Goals/Self-Care Activities: Participate in Transition of Care Program/Attend [facility] scheduled calls Notify RN Care Manager of TOC call rescheduling needs Take all medications as prescribed Attend all scheduled provider appointments Call pharmacy for medication refills 3-7 days in advance of running out of medications Call provider office for new concerns or questions  call the Suicide and Crisis Lifeline: 988 call 1-800-273-TALK (toll free, 24 hour hotline) Contact PCP in non emergent situations if experiencing a Mental Health or Behavioral Health Crisis   Follow Up Plan:  Telephone follow up appointment with care management team member scheduled for:  04/27/23 at 2:00 pm EST.  Follow up with provider re: To reschedule canceled appointment.  Contact [employer] insurance number given for transportation needs for appointments   Contact provider to outpatient psychosocial anxiety related needs is desires or let TOC RN CM make a referral if desired at next call.  Continue with home health physical therapy and home exercises as tolerated.  Monitor blood pressure and blood sugars and record in phone or notebook to take to providers after discussing importance of noting trends vs spot checks.  Call provider with any concerns or questions, patient understand signs and symptoms of stroke. Discuss current permissive HTN parameter sor adjustments  in blood pressure mediations post discharge follow up with PCP.         04/20/23: TOC RN CM completed post discharge TOC follow up outreach today. She lives with daughter who is SW with [employer]. 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 She had a Loop Recorder  placed this admission.   Patient has a PCP appointment this am but canceled due to weather issues and transportation issues today. Patient is to contact PCP for a new follow up appointment and contact [employer] transportation line to set up transportation needs, She was driving prior to admission but having some residual weakness/sensation loss.   Home Health via [facility] for PT/OT has made first visit and was coming today at 230 pm as well. Had DME RW from pta. Discussed progressing to outpatient PT after home health therapy is completed. Patient had been going to the gym regularly prior to this admission, but was concerned when she could or would be able to return safely.   Discussed monitoring blood pressure and blood sugars (once a day) and recording in a log to take to provider visits. No speciality visit follow ups noted in AVS or that patient was aware of. To check with insurance benefits for BP cuff coverage as needs a new one. She also could use a used iPhone so that [employer] would work with phone and outside of home. She now has to be within 10 feet of recording device. Encouraged her to discuss any additional benefits offered by [facility] insurance and TOC can follow up on any needs. Contact number given from website.   Discussed and allowed patient time to express feelings about her health issues, reported crying spells "that come out of no where, fears of being a burden to family. DC Summary discussed outpatient referral for anxiety to psych consult and this was brought up to patient. She wants to wait a bit to see PCP and if it will subside as she feels she had dealt with anxiety before, had medications for it, but acknowledged that talking about things made her feel better. Potential referral for LCSW if does not feel better on follow up call and has had PCP visit to discuss issues.   Patient did wish and verbally agree to follow up calls by [facility],The RN CM in the 30 day program, and is scheduled for a 2  pm call back next Wednesday 04/27/23 with patient's practice TOC RN CM. This note will be routed to that RN CM. Per encounters in Care Plan, patient was being followed by CCM in April 2024.        Gabriel Cirri MSN, RN RN Case Sales executive Health  VBCI-Population Health Office Hours Wed/Thur  8:00 am-6:00 pm Direct Dial: [phone] Main Phone [phone]  Fax: [phone] .com

## 2023-04-20 NOTE — Patient Instructions (Signed)
 Visit Information  Thank you for taking time to visit with me today. Please don't hesitate to contact me if I can be of assistance to you before our next scheduled telephone appointment.  Our next appointment is by telephone on 04/27/23 at 2 pm  Following is a copy of your care plan:   Goals Addressed             This Visit's Progress    Transitions of Care       Current Barriers:  Knowledge Deficits related to plan of care for management of Atrial Fibrillation, HTN, Anxiety, and Stroke  Care Coordination needs related to Transportation Chronic Disease Management support and education needs related to HTN, Anxiety, and Stroke   RNCM Clinical Goal(s):  Patient will work with the Care Management team over the next 30 days to address Transition of Care Barriers: Home Health services Functional/Safety Transportation Post stroke therapy and rehabilitation needs.  verbalize understanding of plan for management of Anxiety and Stroke as evidenced by considering outpatient therapy or anxiety if not abated and outpatient therapy for post stroke residual impairment post discharge from home health therapy.  take all medications exactly as prescribed and will call provider for medication related questions as evidenced by compliance and adherence to prescribed medication regime.  continue to work with RN Care Manager to address care management and care coordination needs related to  HTN, Anxiety, and Stroke as evidenced by adherence to CM Team Scheduled appointments through collaboration with RN Care manager, provider, and care team.   Interventions: Evaluation of current treatment plan related to  self management and patient's adherence to plan as established by provider  Transitions of Care:  New goal. Durable Medical Equipment (DME) reviewed with patient/caregiver and delivery confirmed Doctor Visits  - discussed the importance of doctor visits Communication with insurance provider benefits  re: transportation to appointments and outpatient therapies, possible used iPhone for Loop Recorder portability/data uploading, and any other value based benefits available to patient.  Troubleshoot use of DME in the home  Post discharge activity limitations prescribed by provider reviewed Post-op wound/incision care reviewed with patient/caregiver Reviewed Signs and symptoms of infection  Stroke:  (Status:New goal.) Short Term Goal Reviewed Importance of taking all medications as prescribed Reviewed Importance of attending all scheduled provider appointments Advised to report any changes in symptoms or exercise tolerance Advised patient to discuss medication questions, blood pressure and blood sugar logs, loop recorder, any specialist referrals needed, insurance benefits and added value benefits with provide Assessed social determinant of health barriers Reviewed referrals to home health Reviewed the importance of exercise Assessed for fall status and safety in the home  Patient Goals/Self-Care Activities: Participate in Transition of Care Program/Attend [facility] scheduled calls Notify RN Care Manager of TOC call rescheduling needs Take all medications as prescribed Attend all scheduled provider appointments Call pharmacy for medication refills 3-7 days in advance of running out of medications Call provider office for new concerns or questions  call the Suicide and Crisis Lifeline: 988 call 1-800-273-TALK (toll free, 24 hour hotline) Contact PCP in non emergent situations if experiencing a Mental Health or Behavioral Health Crisis   Follow Up Plan:  Telephone follow up appointment with care management team member scheduled for:  04/27/23 at 2:00 pm EST.  Follow up with provider re: To reschedule canceled appointment.  Contact [employer] insurance number given for transportation needs for appointments   Contact provider to outpatient psychosocial anxiety related needs is desires or let [facility] RN  CM  make a referral if desired at next call.  Continue with home health physical therapy and home exercises as tolerated.  Monitor blood pressure and blood sugars and record in phone or notebook to take to providers after discussing importance of noting trends vs spot checks.  Call provider with any concerns or questions, patient understand signs and symptoms of stroke. Discuss current permissive HTN parameter sor adjustments in blood pressure mediations post discharge follow up with PCP.         Patient verbalizes understanding of instructions and care plan provided today and agrees to view in MyChart. Active MyChart status and patient understanding of how to access instructions and care plan via MyChart confirmed with patient.     Telephone follow up appointment with care management team member scheduled for: Follow up with provider re: To make follow up appointment as discussed.   Please call the care guide team at [phone] if you need to cancel or reschedule your appointment.   Please call the [place] National Suicide Prevention Lifeline: (763) 839-7347 or TTY: (239)457-6347 TTY 4803840033) to talk to a trained counselor call 1-800-273-TALK (toll free, 24 hour hotline) if you are experiencing a Mental Health or Behavioral Health Crisis or need someone to talk to.   Gabriel Cirri MSN, RN RN Case Sales executive Health  VBCI-Population Health Office Hours Wed/Thur  8:00 am-6:00 pm Direct Dial: [phone] Main Phone [phone]  Fax: [phone] [email or web address]

## 2023-04-22 ENCOUNTER — Emergency Department

## 2023-04-22 ENCOUNTER — Observation Stay
Admission: EM | Admit: 2023-04-22 | Discharge: 2023-04-24 | Disposition: A | Discharge status: Home Health | Attending: Internal Medicine | Admitting: Internal Medicine

## 2023-04-22 ENCOUNTER — Other Ambulatory Visit: Payer: Self-pay

## 2023-04-22 DIAGNOSIS — Z7982 Long term (current) use of aspirin: Secondary | ICD-10-CM | POA: Diagnosis not present

## 2023-04-22 DIAGNOSIS — E11649 Type 2 diabetes mellitus with hypoglycemia without coma: Secondary | ICD-10-CM | POA: Insufficient documentation

## 2023-04-22 DIAGNOSIS — I10 Essential (primary) hypertension: Secondary | ICD-10-CM | POA: Diagnosis not present

## 2023-04-22 DIAGNOSIS — I5030 Unspecified diastolic (congestive) heart failure: Secondary | ICD-10-CM | POA: Insufficient documentation

## 2023-04-22 DIAGNOSIS — R0902 Hypoxemia: Secondary | ICD-10-CM | POA: Diagnosis not present

## 2023-04-22 DIAGNOSIS — E1122 Type 2 diabetes mellitus with diabetic chronic kidney disease: Secondary | ICD-10-CM | POA: Insufficient documentation

## 2023-04-22 DIAGNOSIS — N1831 Chronic kidney disease, stage 3a: Secondary | ICD-10-CM | POA: Diagnosis not present

## 2023-04-22 DIAGNOSIS — Z1152 Encounter for screening for COVID-19: Secondary | ICD-10-CM | POA: Insufficient documentation

## 2023-04-22 DIAGNOSIS — F32A Depression, unspecified: Secondary | ICD-10-CM | POA: Insufficient documentation

## 2023-04-22 DIAGNOSIS — N183 Chronic kidney disease, stage 3 unspecified: Secondary | ICD-10-CM | POA: Diagnosis present

## 2023-04-22 DIAGNOSIS — Z79899 Other long term (current) drug therapy: Secondary | ICD-10-CM | POA: Insufficient documentation

## 2023-04-22 DIAGNOSIS — E1129 Type 2 diabetes mellitus with other diabetic kidney complication: Secondary | ICD-10-CM | POA: Diagnosis present

## 2023-04-22 DIAGNOSIS — I130 Hypertensive heart and chronic kidney disease with heart failure and stage 1 through stage 4 chronic kidney disease, or unspecified chronic kidney disease: Secondary | ICD-10-CM | POA: Insufficient documentation

## 2023-04-22 DIAGNOSIS — F419 Anxiety disorder, unspecified: Secondary | ICD-10-CM | POA: Diagnosis not present

## 2023-04-22 DIAGNOSIS — I6930 Unspecified sequelae of cerebral infarction: Secondary | ICD-10-CM | POA: Diagnosis not present

## 2023-04-22 DIAGNOSIS — J45909 Unspecified asthma, uncomplicated: Secondary | ICD-10-CM | POA: Diagnosis not present

## 2023-04-22 DIAGNOSIS — E1169 Type 2 diabetes mellitus with other specified complication: Secondary | ICD-10-CM

## 2023-04-22 DIAGNOSIS — E162 Hypoglycemia, unspecified: Secondary | ICD-10-CM | POA: Diagnosis not present

## 2023-04-22 DIAGNOSIS — R6889 Other general symptoms and signs: Secondary | ICD-10-CM | POA: Diagnosis not present

## 2023-04-22 DIAGNOSIS — R531 Weakness: Secondary | ICD-10-CM | POA: Diagnosis not present

## 2023-04-22 DIAGNOSIS — J101 Influenza due to other identified influenza virus with other respiratory manifestations: Principal | ICD-10-CM

## 2023-04-22 DIAGNOSIS — Z8673 Personal history of transient ischemic attack (TIA), and cerebral infarction without residual deficits: Secondary | ICD-10-CM | POA: Diagnosis not present

## 2023-04-22 DIAGNOSIS — R197 Diarrhea, unspecified: Secondary | ICD-10-CM | POA: Diagnosis not present

## 2023-04-22 DIAGNOSIS — E669 Obesity, unspecified: Secondary | ICD-10-CM | POA: Diagnosis present

## 2023-04-22 DIAGNOSIS — R000 Tachycardia, unspecified: Secondary | ICD-10-CM | POA: Diagnosis not present

## 2023-04-22 DIAGNOSIS — I129 Hypertensive chronic kidney disease with stage 1 through stage 4 chronic kidney disease, or unspecified chronic kidney disease: Secondary | ICD-10-CM | POA: Insufficient documentation

## 2023-04-22 DIAGNOSIS — E785 Hyperlipidemia, unspecified: Secondary | ICD-10-CM | POA: Diagnosis not present

## 2023-04-22 DIAGNOSIS — R918 Other nonspecific abnormal finding of lung field: Secondary | ICD-10-CM | POA: Diagnosis not present

## 2023-04-22 DIAGNOSIS — R509 Fever, unspecified: Secondary | ICD-10-CM | POA: Diagnosis present

## 2023-04-22 DIAGNOSIS — F418 Other specified anxiety disorders: Secondary | ICD-10-CM | POA: Diagnosis present

## 2023-04-22 DIAGNOSIS — R0989 Other specified symptoms and signs involving the circulatory and respiratory systems: Secondary | ICD-10-CM | POA: Diagnosis not present

## 2023-04-22 DIAGNOSIS — I499 Cardiac arrhythmia, unspecified: Secondary | ICD-10-CM | POA: Diagnosis not present

## 2023-04-22 LAB — COMPREHENSIVE METABOLIC PANEL
ALT: 42 U/L (ref 0–44)
AST: 58 U/L — ABNORMAL HIGH (ref 15–41)
Albumin: 3.7 g/dL (ref 3.5–5.0)
Alkaline Phosphatase: 65 U/L (ref 38–126)
Anion gap: 13 (ref 5–15)
BUN: 19 mg/dL (ref 8–23)
CO2: 19 mmol/L — ABNORMAL LOW (ref 22–32)
Calcium: 8.6 mg/dL — ABNORMAL LOW (ref 8.9–10.3)
Chloride: 105 mmol/L (ref 98–111)
Creatinine, Ser: 1.44 mg/dL — ABNORMAL HIGH (ref 0.44–1.00)
GFR, Estimated: 40 mL/min — ABNORMAL LOW (ref >60)
Glucose, Bld: 69 mg/dL — ABNORMAL LOW (ref 70–99)
Potassium: 3.6 mmol/L (ref 3.5–5.1)
Sodium: 137 mmol/L (ref 135–145)
Total Bilirubin: 0.9 mg/dL (ref 0.0–1.2)
Total Protein: 6.8 g/dL (ref 6.5–8.1)

## 2023-04-22 LAB — CBC WITH DIFFERENTIAL/PLATELET
Abs Immature Granulocytes: 0.01 10*3/uL (ref 0.00–0.07)
Basophils Absolute: 0 10*3/uL (ref 0.0–0.1)
Basophils Relative: 0 %
Eosinophils Absolute: 0 10*3/uL (ref 0.0–0.5)
Eosinophils Relative: 0 %
HCT: 43.1 % (ref 36.0–46.0)
Hemoglobin: 14.4 g/dL (ref 12.0–15.0)
Immature Granulocytes: 0 %
Lymphocytes Relative: 16 %
Lymphs Abs: 0.8 10*3/uL (ref 0.7–4.0)
MCH: 31.3 pg (ref 26.0–34.0)
MCHC: 33.4 g/dL (ref 30.0–36.0)
MCV: 93.7 fL (ref 80.0–100.0)
Monocytes Absolute: 0.5 10*3/uL (ref 0.1–1.0)
Monocytes Relative: 10 %
Neutro Abs: 3.8 10*3/uL (ref 1.7–7.7)
Neutrophils Relative %: 74 %
Platelets: 161 10*3/uL (ref 150–400)
RBC: 4.6 MIL/uL (ref 3.87–5.11)
RDW: 13.1 % (ref 11.5–15.5)
WBC: 5.2 10*3/uL (ref 4.0–10.5)
nRBC: 0 % (ref 0.0–0.2)

## 2023-04-22 LAB — APTT: aPTT: 30 s (ref 24–36)

## 2023-04-22 LAB — CBG MONITORING, ED
Glucose-Capillary: 38 mg/dL — CL (ref 70–99)
Glucose-Capillary: 40 mg/dL — CL (ref 70–99)
Glucose-Capillary: 192 mg/dL — ABNORMAL HIGH (ref 70–99)

## 2023-04-22 LAB — RESP PANEL BY RT-PCR (RSV, FLU A&B, COVID)  RVPGX2
Influenza A by PCR: POSITIVE — AB
Influenza B by PCR: NEGATIVE
Resp Syncytial Virus by PCR: NEGATIVE
SARS Coronavirus 2 by RT PCR: NEGATIVE

## 2023-04-22 LAB — PROTIME-INR
INR: 1.1 (ref 0.8–1.2)
Prothrombin Time: 13.9 s (ref 11.4–15.2)

## 2023-04-22 LAB — BRAIN NATRIURETIC PEPTIDE: B Natriuretic Peptide: 11.1 pg/mL (ref 0.0–100.0)

## 2023-04-22 LAB — LACTIC ACID, PLASMA: Lactic Acid, Venous: 1.2 mmol/L (ref 0.5–1.9)

## 2023-04-22 MED ORDER — DEXTROSE 10 % IV SOLN
250.0000 mL | Freq: Once | INTRAVENOUS | Status: AC
  Administered 2023-04-22: 250 mL via INTRAVENOUS

## 2023-04-22 MED ORDER — OSELTAMIVIR PHOSPHATE 75 MG PO CAPS
75.0000 mg | ORAL_CAPSULE | Freq: Once | ORAL | Status: AC
  Administered 2023-04-22: 75 mg via ORAL
  Filled 2023-04-22: qty 1

## 2023-04-22 MED ORDER — ENOXAPARIN SODIUM 60 MG/0.6ML IJ SOSY
45.0000 mg | PREFILLED_SYRINGE | INTRAMUSCULAR | Status: DC
  Administered 2023-04-23 – 2023-04-24 (×2): 45 mg via SUBCUTANEOUS
  Filled 2023-04-22 (×2): qty 0.6

## 2023-04-22 MED ORDER — OSELTAMIVIR PHOSPHATE 75 MG PO CAPS
75.0000 mg | ORAL_CAPSULE | ORAL | Status: DC
  Filled 2023-04-22: qty 1

## 2023-04-22 MED ORDER — ACETAMINOPHEN 325 MG PO TABS
650.0000 mg | ORAL_TABLET | Freq: Four times a day (QID) | ORAL | Status: DC | PRN
  Administered 2023-04-23: 650 mg via ORAL
  Filled 2023-04-22: qty 2

## 2023-04-22 MED ORDER — ONDANSETRON 4 MG PO TBDP: 4.0000 mg | ORAL_TABLET | Freq: Four times a day (QID) | ORAL | 0 refills | Status: DC | PRN

## 2023-04-22 MED ORDER — HYDRALAZINE HCL 20 MG/ML IJ SOLN: 5.0000 mg | INTRAMUSCULAR | Status: DC | PRN

## 2023-04-22 MED ORDER — ALBUTEROL SULFATE HFA 108 (90 BASE) MCG/ACT IN AERS: 2.0000 | INHALATION_SPRAY | RESPIRATORY_TRACT | Status: DC | PRN

## 2023-04-22 MED ORDER — DEXTROSE 10 % IV SOLN
INTRAVENOUS | Status: DC
Start: 2023-04-22 — End: 2023-04-23

## 2023-04-22 MED ORDER — ACETAMINOPHEN 500 MG PO TABS
1000.0000 mg | ORAL_TABLET | ORAL | Status: AC
  Administered 2023-04-22: 1000 mg via ORAL
  Filled 2023-04-22: qty 2

## 2023-04-22 MED ORDER — OSELTAMIVIR PHOSPHATE 30 MG PO CAPS: 30.0000 mg | ORAL_CAPSULE | Freq: Every day | ORAL | 0 refills | Status: DC

## 2023-04-22 MED ORDER — SODIUM CHLORIDE 0.9 % IV BOLUS
500.0000 mL | Freq: Once | INTRAVENOUS | Status: AC
  Administered 2023-04-22: 500 mL via INTRAVENOUS

## 2023-04-22 MED ORDER — OSELTAMIVIR PHOSPHATE 75 MG PO CAPS: 75.0000 mg | ORAL_CAPSULE | Freq: Two times a day (BID) | ORAL | Status: DC

## 2023-04-22 MED ORDER — DM-GUAIFENESIN ER 30-600 MG PO TB12
1.0000 | ORAL_TABLET | Freq: Two times a day (BID) | ORAL | Status: DC | PRN
  Administered 2023-04-23 – 2023-04-24 (×3): 1 via ORAL
  Filled 2023-04-22 (×4): qty 1

## 2023-04-22 MED ORDER — ONDANSETRON HCL 4 MG/2ML IJ SOLN: 4.0000 mg | Freq: Three times a day (TID) | INTRAMUSCULAR | Status: DC | PRN

## 2023-04-22 MED ORDER — DEXTROSE 50 % IV SOLN: 50.0000 mL | INTRAVENOUS | Status: DC | PRN

## 2023-04-22 MED ORDER — ALBUTEROL SULFATE (2.5 MG/3ML) 0.083% IN NEBU
2.5000 mg | INHALATION_SOLUTION | RESPIRATORY_TRACT | Status: DC | PRN
  Administered 2023-04-23: 2.5 mg via RESPIRATORY_TRACT
  Filled 2023-04-22: qty 3

## 2023-04-22 MED ORDER — OSELTAMIVIR PHOSPHATE 30 MG PO CAPS
30.0000 mg | ORAL_CAPSULE | Freq: Two times a day (BID) | ORAL | Status: DC
  Administered 2023-04-23 – 2023-04-24 (×3): 30 mg via ORAL
  Filled 2023-04-22 (×3): qty 1

## 2023-04-22 NOTE — ED Notes (Signed)
 Pt blood sugar reading was 38. Britt Boozer, RN, notified. Pt given 8 oz of orange juice. Pt drank all of the juice.

## 2023-04-22 NOTE — ED Triage Notes (Signed)
 Pt to ED via [facility] from home for c/o weakness x2 days. Pt reports slipping out of recliner today. 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 Hx of DM, HTN. Takes Plavix.  Temp 98.8 BP 152/88 HR 118 O2 98% ra

## 2023-04-22 NOTE — ED Notes (Signed)
 This RN was notified by tech that pt's sugar was 38.Tami Jones Pt given juice and sandwich. Will recheck CBG.

## 2023-04-22 NOTE — H&P (Signed)
 History and Physical    Tami Jones ZOX:[id number] DOB: 1955-10-20 DOA: 04/22/2023  Referring MD/NP/PA:   PCP: Karie Schwalbe, MD   Patient coming from:  The patient is coming from home.     Chief Complaint: fever, weakness and cough, diarrhea  HPI: Tami Jones is a 68 y.o. female with medical history significant of HTN,  HLD, asthma, dCHF, stroke with right sided weakness, GERD, depression with anxiety, CKD-3A, obesity, who presents with fever, cough, weakness, diarrhea.  Patient states that she has been sick in the past 2 days.  She has cough with little mucus production, fever, chills, body aches, poor appetite, decreased oral intake, nasal congestion, no chest pain or SOB.  Patient has had 2 episode of diarrhea today.  No nausea, vomiting, abdominal pain.  No symptoms of UTI.  She has generalized weakness. Today she was so weak that when she tried to get up out of the recliner she just sort of slid down to the floor and could not get up due to fatigue.  Her daughter said the patient did not have any injury.  No loss of consciousness.  Patient was found to have hypoglycemia with blood sugar 69 --> 40 in ED.  Data reviewed independently and ED Course: pt was found to have positive PCR for flu A, lactic acid 1.2, INR 1.1, PTT 30, renal function close to baseline, temperature 102.9, blood pressure 144/73, heart rate 118, 83, RR 26, oxygen saturation 94% on room air.  Chest x-ray showed mild left basilar atelectasis versus mild infiltration.  Patient is placed on telemetry bed for observation.   EKG: I have personally reviewed.  Sinus rhythm, QTc 444, L AE, T wave inversion in inferior leads, ST depression in lateral leads, poor R wave progression   Review of Systems:   General: Has fevers, chills, no body weight gain, has poor appetite, has fatigue HEENT: no blurry vision, hearing changes or sore throat Respiratory: no dyspnea, has cough, no wheezing CV: no chest pain, no  palpitations GI: no nausea, vomiting, abdominal pain, diarrhea, constipation GU: no dysuria, burning on urination, increased urinary frequency, hematuria  Ext: no leg edema Neuro:  no vision change or hearing loss.  Has right-sided weakness from previous stroke Skin: no rash, no skin tear. MSK: No muscle spasm, no deformity, no limitation of range of movement in spin Heme: No easy bruising.  Travel history: No recent long distant travel.   Allergy:  Allergies  Allergen Reactions   Citalopram Other (See Comments)    Feels odd   Doxycycline Hyclate Nausea And Vomiting   Erythromycin Other (See Comments)    Irritated stomach   Montelukast Sodium Itching   Nitrofurantoin Nausea And Vomiting   Sulfa Antibiotics Nausea And Vomiting   Tramadol Hcl Other (See Comments)    Feels odd   Venlafaxine Hcl Other (See Comments)    Feels odd   Amoxicillin-Pot Clavulanate Nausea And Vomiting   Clarithromycin Rash     See ER note 02/2017   Gabapentin     Makes her feel off.    Lisinopril     Makes patient feel bad, and some noted swelling in ankles but not mouth, face, tongue.     Past Medical History:  Diagnosis Date   Anxiety    Asthma    CVA (cerebral vascular accident) (HCC) 03/2006   right occipital, Dr. 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 EF 60%   CESAREAN SECTION     ESOPHAGOGASTRODUODENOSCOPY  05/2005   LOOP RECORDER INSERTION N/A 04/15/2023   Procedure: LOOP RECORDER INSERTION;  Surgeon: Regan Lemming, MD;  Location: [facility];  Service: Cardiovascular;  Laterality: N/A;   TONSILLECTOMY AND ADENOIDECTOMY      Social History:  reports that she has never smoked. She has never been exposed to tobacco smoke. She has never  used smokeless tobacco. She reports that she does not drink alcohol and does not use drugs.  Family History:  Family History  Problem Relation Age of Onset   Coronary artery disease Mother    Diabetes Mellitus II Mother    Heart attack Mother 74   Diabetes Mellitus II Maternal Grandmother      Prior to Admission medications   Medication Sig Start Date End Date Taking? Authorizing Provider  ondansetron (ZOFRAN-ODT) 4 MG disintegrating tablet Take 1 tablet (4 mg total) by mouth every 6 (six) hours as needed for nausea or vomiting. 04/22/23  Yes Sharyn Creamer, MD  oseltamivir (TAMIFLU) 30 MG capsule Take 1 capsule (30 mg total) by mouth daily. 04/22/23  Yes Sharyn Creamer, MD  acetaminophen (TYLENOL) 325 MG tablet Take 325 mg by mouth as needed for mild pain (pain score 1-3) or moderate pain (pain score 4-6).    [provider]  ALPRAZolam Prudy Feeler) 0.25 MG tablet Take 1 tablet (0.25 mg total) by mouth 3 (three) times daily as needed for anxiety or sleep. Patient taking differently: Take 0.125 mg by mouth at bedtime as needed for anxiety or sleep. 12/24/22   Karie Schwalbe, MD  amLODipine (NORVASC) 10 MG tablet Take 1 tablet (10 mg total) by mouth daily. Patient taking differently: Take 10 mg by mouth every evening. 03/24/23   Karie Schwalbe, MD  aspirin EC 81 MG tablet Take 81 mg by mouth every evening.     [provider]  atorvastatin (LIPITOR) 80 MG tablet TAKE 1 TABLET BY MOUTH EVERY EVENING 06/11/22   Karie Schwalbe, MD  Calcium Carbonate Antacid (TUMS PO) Take 2 tablets by mouth daily as needed (heartburn).    [provider]  clopidogrel (PLAVIX) 75 MG tablet TAKE 1 TABLET BY MOUTH EVERY DAY 03/22/22   Tillman Abide I, MD  ezetimibe (ZETIA) 10 MG tablet Take 1 tablet (10 mg total) by mouth daily. 04/16/23   Rhetta Mura, MD  glipiZIDE (GLUCOTROL XL) 5 MG 24 hr tablet TAKE 1 TABLET (5 MG TOTAL) BY MOUTH IN THE MORNING AND AT BEDTIME 01/21/23   Tillman Abide I,  MD  glucose blood test strip Use daily as instructed 06/12/22   Karie Schwalbe, MD  ketoconazole (NIZORAL) 2 % cream Apply 1 Application topically 2 (two) times daily as needed for irritation. Patient taking differently: Apply 1 Application topically as needed for irritation. 12/09/22   Karie Schwalbe, MD  lidocaine (XYLOCAINE) 5 % ointment Apply 1 Application topically as needed. Patient not taking: Reported on 04/13/2023 10/03/22   Sharyn Creamer, MD  metoprolol succinate (TOPROL-XL) 25 MG 24 hr tablet Take 2 tablets (50 mg total) by mouth daily. 04/15/23   Rhetta Mura, MD  pantoprazole (PROTONIX) 40 MG tablet Take 40 mg by mouth daily as needed (heartburn). 03/16/23   [provider]  Physical Exam: Vitals:   04/22/23 1730 04/22/23 1800 04/22/23 2120 04/23/23 0004  BP:  136/78 (!) 144/73 (!) 161/90  Pulse: (!) 107 100 83 81  Resp: (!) 26 20 17 18   Temp:  98.6 F (37 C)  98.4 F (36.9 C)  TempSrc:  Oral  Oral  SpO2: 96% 94% 97% 98%  Weight:      Height:       General: Not in acute distress HEENT:       Eyes: PERRL, EOMI, no jaundice       ENT: No discharge from the ears and nose, no pharynx injection, no tonsillar enlargement.        Neck: No JVD, no bruit, no mass felt. Heme: No neck lymph node enlargement. Cardiac: S1/S2, RRR, No murmurs, No gallops or rubs. Respiratory: No rales, wheezing, rhonchi or rubs. GI: Soft, nondistended, nontender, no rebound pain, no organomegaly, BS present. GU: No hematuria Ext: No pitting leg edema bilaterally. 1+DP/PT pulse bilaterally. Musculoskeletal: No joint deformities, No joint redness or warmth, no limitation of ROM in spin. Skin: No rashes.  Neuro: Alert, oriented X3, cranial nerves II-XII grossly intact, has right-sided weakness from previous stroke Psych: Patient is not psychotic, no suicidal or hemocidal ideation.  Labs on Admission: I have personally reviewed following labs and imaging studies  CBC: Recent  Labs  Lab 04/22/23 1702  WBC 5.2  NEUTROABS 3.8  HGB 14.4  HCT 43.1  MCV 93.7  PLT 161   Basic Metabolic Panel: Recent Labs  Lab 04/22/23 1702  NA 137  K 3.6  CL 105  CO2 19*  GLUCOSE 69*  BUN 19  CREATININE 1.44*  CALCIUM 8.6*   GFR: Estimated Creatinine Clearance: 42.7 mL/min (A) (by C-G formula based on SCr of 1.44 mg/dL (H)). Liver Function Tests: Recent Labs  Lab 04/22/23 1702  AST 58*  ALT 42  ALKPHOS 65  BILITOT 0.9  PROT 6.8  ALBUMIN 3.7   No results for input(s): "LIPASE", "AMYLASE" in the last 168 hours. No results for input(s): "AMMONIA" in the last 168 hours. Coagulation Profile: Recent Labs  Lab 04/22/23 1702  INR 1.1   Cardiac Enzymes: No results for input(s): "CKTOTAL", "CKMB", "CKMBINDEX", "TROPONINI" in the last 168 hours. BNP (last 3 results) No results for input(s): "PROBNP" in the last 8760 hours. HbA1C: No results for input(s): "HGBA1C" in the last 72 hours. CBG: Recent Labs  Lab 04/22/23 2127 04/22/23 2155 04/22/23 2255  GLUCAP 38* 40* 192*   Lipid Profile: No results for input(s): "CHOL", "HDL", "LDLCALC", "TRIG", "CHOLHDL", "LDLDIRECT" in the last 72 hours. Thyroid Function Tests: No results for input(s): "TSH", "T4TOTAL", "FREET4", "T3FREE", "THYROIDAB" in the last 72 hours. Anemia Panel: No results for input(s): "VITAMINB12", "FOLATE", "FERRITIN", "TIBC", "IRON", "RETICCTPCT" in the last 72 hours. Urine analysis:    Component Value Date/Time   COLORURINE YELLOW 04/13/2023 0930   APPEARANCEUR HAZY (A) 04/13/2023 0930   LABSPEC 1.042 (H) 04/13/2023 0930   PHURINE 5.0 04/13/2023 0930   GLUCOSEU NEGATIVE 04/13/2023 0930   HGBUR NEGATIVE 04/13/2023 0930   HGBUR negative 02/05/2010 1336   BILIRUBINUR NEGATIVE 04/13/2023 0930   BILIRUBINUR negative 09/19/2022 1027   BILIRUBINUR negative 08/25/2020 1350   KETONESUR NEGATIVE 04/13/2023 0930   PROTEINUR NEGATIVE 04/13/2023 0930   UROBILINOGEN 0.2 09/19/2022 1027    UROBILINOGEN 0.2 02/05/2010 1336   NITRITE NEGATIVE 04/13/2023 0930   LEUKOCYTESUR SMALL (A) 04/13/2023 0930   Sepsis Labs: @LABRCNTIP (procalcitonin:4,lacticidven:4) ) Recent Results (from the past 240  hours)  Resp panel by RT-PCR (RSV, Flu A&B, Covid) Anterior Nasal Swab     Status: Abnormal   Collection Time: 04/22/23  4:18 PM   Specimen: Anterior Nasal Swab  Result Value Ref Range Status   SARS Coronavirus 2 by RT PCR NEGATIVE NEGATIVE Final    Comment: (NOTE) SARS-CoV-2 target nucleic acids are NOT DETECTED.  The SARS-CoV-2 RNA is generally detectable in upper respiratory specimens during the acute phase of infection. The lowest concentration of SARS-CoV-2 viral copies this assay can detect is 138 copies/mL. A negative result does not preclude SARS-Cov-2 infection and should not be used as the sole basis for treatment or other patient management decisions. A negative result may occur with  improper specimen collection/handling, submission of specimen other than nasopharyngeal swab, presence of viral mutation(s) within the areas targeted by this assay, and inadequate number of viral copies(<138 copies/mL). A negative result must be combined with clinical observations, patient history, and epidemiological information. The expected result is Negative.  Fact Sheet for Patients:  BloggerCourse.com  Fact Sheet for Healthcare Providers:  SeriousBroker.it  This test is no t yet approved or cleared by the [place] FDA and  has been authorized for detection and/or diagnosis of SARS-CoV-2 by FDA under an Emergency Use Authorization (EUA). This EUA will remain  in effect (meaning this test can be used) for the duration of the COVID-19 declaration under Section 564(b)(1) of the Act, 21 U.S.C.section 360bbb-3(b)(1), unless the authorization is terminated  or revoked sooner.       Influenza A by PCR POSITIVE (A) NEGATIVE Final    Influenza B by PCR NEGATIVE NEGATIVE Final    Comment: (NOTE) The Xpert Xpress SARS-CoV-2/FLU/RSV plus assay is intended as an aid in the diagnosis of influenza from Nasopharyngeal swab specimens and should not be used as a sole basis for treatment. Nasal washings and aspirates are unacceptable for Xpert Xpress SARS-CoV-2/FLU/RSV testing.  Fact Sheet for Patients: BloggerCourse.com  Fact Sheet for Healthcare Providers: SeriousBroker.it  This test is not yet approved or cleared by the [place] FDA and has been authorized for detection and/or diagnosis of SARS-CoV-2 by FDA under an Emergency Use Authorization (EUA). This EUA will remain in effect (meaning this test can be used) for the duration of the COVID-19 declaration under Section 564(b)(1) of the Act, 21 U.S.C. section 360bbb-3(b)(1), unless the authorization is terminated or revoked.     Resp Syncytial Virus by PCR NEGATIVE NEGATIVE Final    Comment: (NOTE) Fact Sheet for Patients: BloggerCourse.com  Fact Sheet for Healthcare Providers: SeriousBroker.it  This test is not yet approved or cleared by the [place] FDA and has been authorized for detection and/or diagnosis of SARS-CoV-2 by FDA under an Emergency Use Authorization (EUA). This EUA will remain in effect (meaning this test can be used) for the duration of the COVID-19 declaration under Section 564(b)(1) of the Act, 21 U.S.C. section 360bbb-3(b)(1), unless the authorization is terminated or revoked.  Performed at [facility], [address], [place], [place] [address]      Radiological Exams on Admission:   Assessment/Plan Principal Problem:   Influenza A Active Problems:   Hypoglycemia   Diarrhea   Dyslipidemia   HTN (hypertension)   Type II diabetes mellitus with renal manifestations (HCC)   History of CVA with residual deficit    CKD (chronic kidney disease), stage IIIa   Depression with anxiety   Obesity (BMI 30-39.9)   Assessment and Plan:  Influenza A infection: No  oxygen desaturation.   -Placed on telemetry bed for observation -Tamiflu 75 mg twice daily -Bronchodilators -As needed Mucinex -IV fluid: 500 cc normal saline -PT/OT  Hypoglycemia: Blood sugar down to 40s.  Initially started D10, blood sugar increased to 192, then D10 was discontinued. -Hold glipizide -Check CBG every 2 hour -D50 as needed  Diarrhea: Patient has had 2 episode of diarrhea today, no abdominal pain, nausea, vomiting.  Low suspicions for C. difficile.  Likely due to flu A infection -Supportive care -As needed Imodium  Dyslipidemia -Lipitor, zetia  HTN (hypertension) -IV hydralazine as needed -Amlodipine, metoprolol  Type II diabetes mellitus with renal manifestations [facility]): Recent A1c 6.8, well-controlled.  Patient is taking glipizide.  Now has hypoglycemia. -Hold glipizide  History of CVA with residual deficit -Aspirin, Plavix -Lipitor, Zetia  CKD (chronic kidney disease), stage IIIa: Renal function close to baseline.  Recent baseline creatinine 1.2-1.4.  Her creatinine is 1.44, BUN 19, GFR 40. -Follow-up with BMP  Depression with anxiety -Continue home medication: As needed Xanax  Obesity (BMI 30-39.9): Body weight 93.9 kg, BMI 35.53 -Encourage losing weight -Exercise healthy diet    DVT ppx: SQ Lovenox  Code Status: Full code     Family Communication:Yes, patient's   daughter at bed side.     Disposition Plan:  Anticipate discharge back to previous environment  Consults called: None  Admission status and Level of care: Telemetry Medical:    for obs    Dispo: The patient is from: Home              Anticipated d/c is to: Home              Anticipated d/c date is: 1 day              Patient currently is not medically stable to d/c.    Severity of Illness:  The appropriate patient status for  this patient is OBSERVATION. Observation status is judged to be reasonable and necessary in order to provide the required intensity of service to ensure the patient's safety. The patient's presenting symptoms, physical exam findings, and initial radiographic and laboratory data in the context of their medical condition is felt to place them at decreased risk for further clinical deterioration. Furthermore, it is anticipated that the patient will be medically stable for discharge from the hospital within 2 midnights of admission.        Date of Service 04/23/2023    Lorretta Harp [facility]   If 7PM-7AM, please contact night-coverage [email or web address] 04/23/2023, 12:28 AM

## 2023-04-22 NOTE — ED Notes (Signed)
 Pt's sugar slightly improved however remains low. Provider aware. Will administer orders.

## 2023-04-22 NOTE — Progress Notes (Signed)
 PHARMACIST - PHYSICIAN COMMUNICATION  CONCERNING:  Enoxaparin (Lovenox) for DVT Prophylaxis    RECOMMENDATION: Patient was prescribed enoxaprin 40mg  q24 hours for VTE prophylaxis.   Filed Weights   04/22/23 1611  Weight: 93.9 kg (207 lb)    Body mass index is 35.53 kg/m.  Estimated Creatinine Clearance: 42.7 mL/min (A) (by C-G formula based on SCr of 1.44 mg/dL (H)).   Based on [facility] policy patient is candidate for enoxaparin 0.5mg /kg TBW SQ every 24 hours based on BMI being >30.  DESCRIPTION: Pharmacy has adjusted enoxaparin dose per [facility] policy.  Patient is now receiving enoxaparin 0.5 mg/kg every 24 hours   Otelia Sergeant, PharmD, [facility] 04/22/2023 10:55 PM

## 2023-04-22 NOTE — ED Provider Notes (Signed)
 [facility] Provider Note    Event Date/Time   First MD Initiated Contact with Patient 04/22/23 1612     (approximate)   History   Weakness   HPI  Tami Jones is a 68 y.o. female history of previous stroke type 2 diabetes  Started feeling very weak yesterday with runny nose cough body aches.  Reports grandson with similar symptoms feels like she has a bad "cold" but has been very fatigued  Today she was so weak that when she tried to get up out of the recliner she just sort of slid down to the floor and could not get up due to fatigue.  No weakness in any 1 area but she does report some difficulty with mobility due to previous stroke.  She is reports feeling very tired decreased appetite no abdominal pain no rash no pain or burning with urination     Physical Exam   Triage Vital Signs: ED Triage Vitals  Encounter Vitals Group     BP 04/22/23 1613 (!) 158/114     Systolic BP Percentile --      Diastolic BP Percentile --      Pulse Rate 04/22/23 1613 (!) 118     Resp 04/22/23 1613 (!) 25     Temp 04/22/23 1613 (!) 102.9 F (39.4 C)     Temp Source 04/22/23 1613 Oral     SpO2 04/22/23 1613 94 %     Weight 04/22/23 1611 207 lb (93.9 kg)     Height 04/22/23 1611 5\' 4"  (1.626 m)     Head Circumference --      Peak Flow --      Pain Score 04/22/23 1611 0     Pain Loc --      Pain Education --      Exclude from Growth Chart --     Most recent vital signs: Vitals:   04/22/23 1800 04/22/23 2120  BP: 136/78 (!) 144/73  Pulse: 100 83  Resp: 20 17  Temp: 98.6 F (37 C)   SpO2: 94% 97%     General: Awake, no distress.  Appears mildly ill in no distress though.  Very pleasant fully oriented CV:  Good peripheral perfusion.  Tachycardic no murmur Resp:  Normal effort.  Clear bilateral.  Frequent dry cough.  Mild nasal congestion Abd:  No distention.  Soft nontender nondistended throughout Other:  Moves extremities able to sit up with  assistance but overall appears generally fatigued with about 4-1/2 to 5 out of 5 strength in all extremities but again appears fatigued   ED Results / Procedures / Treatments   Labs (all labs ordered are listed, but only abnormal results are displayed) Labs Reviewed  RESP PANEL BY RT-PCR (RSV, FLU A&B, COVID)  [id number] - Abnormal; Notable for the following components:      Result Value   Influenza A by PCR POSITIVE (*)    All other components within normal limits  COMPREHENSIVE METABOLIC PANEL - Abnormal; Notable for the following components:   CO2 19 (*)    Glucose, Bld 69 (*)    Creatinine, Ser 1.44 (*)    Calcium 8.6 (*)    AST 58 (*)    GFR, Estimated 40 (*)    All other components within normal limits  CULTURE, BLOOD (ROUTINE X 2)  CULTURE, BLOOD (ROUTINE X 2)  LACTIC ACID, PLASMA  CBC WITH DIFFERENTIAL/PLATELET  PROTIME-INR  APTT  CBG MONITORING, ED  EKG  EKG interpreted by me at 1630 heart rate 115 QRS 90 QTc 440 Sinus tachycardia, mild nonspecific T wave abnormality possible mild demand ischemia versus LVH.  no STEMI   RADIOLOGY  DG Chest Port 1 View Result Date: 04/22/2023 CLINICAL DATA:  Weakness x2 days. EXAM: PORTABLE CHEST 1 VIEW COMPARISON:  December 12, 2022 FINDINGS: The heart size and mediastinal contours are within normal limits. Low lung volumes are noted. Mild atelectasis and/or infiltrate is seen within the left lung base. No pleural effusion or pneumothorax is identified. Multilevel degenerative changes seen throughout the thoracic spine. IMPRESSION: Low lung volumes with mild left basilar atelectasis and/or infiltrate. Electronically Signed   By: Aram Candela M.D.   On: 04/22/2023 20:36     Chest x-ray interpreted by me negative for acute infiltrate.   PROCEDURES:  Critical Care performed: No  Procedures   MEDICATIONS ORDERED IN ED: Medications  oseltamivir (TAMIFLU) capsule 75 mg (has no administration in time range)  sodium chloride  0.9 % bolus 500 mL (500 mLs Intravenous New Bag/Given 04/22/23 1708)  acetaminophen (TYLENOL) tablet 1,000 mg (1,000 mg Oral Given 04/22/23 1623)  sodium chloride 0.9 % bolus 500 mL (500 mLs Intravenous New Bag/Given 04/22/23 1941)     IMPRESSION / MDM / ASSESSMENT AND PLAN / ED COURSE  I reviewed the triage vital signs and the nursing notes.                              Differential diagnosis includes, but is not limited to, possible infectious illness, viral syndrome, especially high in the differential be viral syndrome as patient's daughter and grandson also with similar symptoms patient with runny nose cough fevers chills body aches in the middle of flu season.  Lung sounds clear bilaterally no shortness of breath.  She reports generalized fatigue and malaise.  High fever and bodyaches  Upon review of the patient's history she has noted as well as her clinical evaluation to be positive for influenza A.  Suspect this explains her symptoms.  Chest x-ray overall appears clear radiologist advising possible atelectasis versus infiltrate in the left base based on the patient's clinical history flu positive test clear lung sounds normal work of breathing and oxygen saturation I clinically doubt this represents an acute bacterial pneumonia.  After receiving IV fluid patient feeling much better vital signs of all normalized.  She is able to ambulate to her baseline feels much improved using her walker with steady gait.  Appropriate for discharge  Discussed risks benefits and patient agreeable to treatment with Tamiflu.  Adjusting for GFR will send prescription to her pharmacy for Tamiflu and Zofran Patient's presentation is most consistent with acute complicated illness / injury requiring diagnostic workup.      Clinical Course as of 04/22/23 2132  Fri Apr 22, 2023  1925 Patient is feeling improved vital signs of normalized.  Labs reveal mild acute kidney injury consistent with clinical history of  probable dehydration.  She is currently feeling much better.  Plan to trial ambulation with walker which is her baseline, if she is able to ambulate well feels improved and is currently taking by mouth without difficulty likely discharge to home to continue treatment.  Discussed diagnosis of influenza, dehydration with patient and daughter both understanding [MQ]    Clinical Course User Index [MQ] Sharyn Creamer, MD   Return precautions and treatment recommendations and follow-up discussed with the patient who is  agreeable with the plan.   FINAL CLINICAL IMPRESSION(S) / ED DIAGNOSES   Final diagnoses:  Influenza A     Rx / DC Orders   ED Discharge Orders          Ordered    ondansetron (ZOFRAN-ODT) 4 MG disintegrating tablet  Every 6 hours PRN        04/22/23 2129    oseltamivir (TAMIFLU) 30 MG capsule  Daily        04/22/23 2129             Note:  This document was prepared using Dragon voice recognition software and may include unintentional dictation errors.   Sharyn Creamer, MD 04/22/23 2132

## 2023-04-22 NOTE — ED Notes (Signed)
 Pt ambulated with walker around the bed. Steady and even gait. No complaints. VSS throughout. Pt stated she feels comfortable going home. Family states she has a walker at home. Provider aware.

## 2023-04-23 DIAGNOSIS — J101 Influenza due to other identified influenza virus with other respiratory manifestations: Secondary | ICD-10-CM | POA: Diagnosis not present

## 2023-04-23 DIAGNOSIS — R197 Diarrhea, unspecified: Secondary | ICD-10-CM | POA: Diagnosis present

## 2023-04-23 LAB — BASIC METABOLIC PANEL
Anion gap: 12 (ref 5–15)
BUN: 22 mg/dL (ref 8–23)
CO2: 21 mmol/L — ABNORMAL LOW (ref 22–32)
Calcium: 8 mg/dL — ABNORMAL LOW (ref 8.9–10.3)
Chloride: 105 mmol/L (ref 98–111)
Creatinine, Ser: 1.36 mg/dL — ABNORMAL HIGH (ref 0.44–1.00)
GFR, Estimated: 43 mL/min — ABNORMAL LOW (ref >60)
Glucose, Bld: 72 mg/dL (ref 70–99)
Potassium: 3.2 mmol/L — ABNORMAL LOW (ref 3.5–5.1)
Sodium: 138 mmol/L (ref 135–145)

## 2023-04-23 LAB — GLUCOSE, CAPILLARY
Glucose-Capillary: 119 mg/dL — ABNORMAL HIGH (ref 70–99)
Glucose-Capillary: 121 mg/dL — ABNORMAL HIGH (ref 70–99)
Glucose-Capillary: 127 mg/dL — ABNORMAL HIGH (ref 70–99)
Glucose-Capillary: 131 mg/dL — ABNORMAL HIGH (ref 70–99)
Glucose-Capillary: 138 mg/dL — ABNORMAL HIGH (ref 70–99)
Glucose-Capillary: 141 mg/dL — ABNORMAL HIGH (ref 70–99)
Glucose-Capillary: 146 mg/dL — ABNORMAL HIGH (ref 70–99)
Glucose-Capillary: 147 mg/dL — ABNORMAL HIGH (ref 70–99)
Glucose-Capillary: 212 mg/dL — ABNORMAL HIGH (ref 70–99)
Glucose-Capillary: 62 mg/dL — ABNORMAL LOW (ref 70–99)
Glucose-Capillary: 96 mg/dL (ref 70–99)

## 2023-04-23 LAB — CBC
HCT: 37.3 % (ref 36.0–46.0)
Hemoglobin: 12.5 g/dL (ref 12.0–15.0)
MCH: 30.6 pg (ref 26.0–34.0)
MCHC: 33.5 g/dL (ref 30.0–36.0)
MCV: 91.2 fL (ref 80.0–100.0)
Platelets: 144 10*3/uL — ABNORMAL LOW (ref 150–400)
RBC: 4.09 MIL/uL (ref 3.87–5.11)
RDW: 13.2 % (ref 11.5–15.5)
WBC: 3.4 10*3/uL — ABNORMAL LOW (ref 4.0–10.5)
nRBC: 0 % (ref 0.0–0.2)

## 2023-04-23 MED ORDER — METOPROLOL SUCCINATE ER 50 MG PO TB24
50.0000 mg | ORAL_TABLET | Freq: Every day | ORAL | Status: DC
  Administered 2023-04-23 – 2023-04-24 (×2): 50 mg via ORAL
  Filled 2023-04-23 (×2): qty 1

## 2023-04-23 MED ORDER — ASPIRIN 81 MG PO TBEC
81.0000 mg | DELAYED_RELEASE_TABLET | Freq: Every evening | ORAL | Status: DC
  Administered 2023-04-23: 81 mg via ORAL
  Filled 2023-04-23: qty 1

## 2023-04-23 MED ORDER — CLOPIDOGREL BISULFATE 75 MG PO TABS
75.0000 mg | ORAL_TABLET | Freq: Every day | ORAL | Status: DC
  Administered 2023-04-23 – 2023-04-24 (×2): 75 mg via ORAL
  Filled 2023-04-23 (×2): qty 1

## 2023-04-23 MED ORDER — PANTOPRAZOLE SODIUM 40 MG PO TBEC: 40.0000 mg | DELAYED_RELEASE_TABLET | Freq: Every day | ORAL | Status: DC | PRN

## 2023-04-23 MED ORDER — ALPRAZOLAM 0.25 MG PO TABS: 0.1250 mg | ORAL_TABLET | Freq: Every evening | ORAL | Status: DC | PRN

## 2023-04-23 MED ORDER — POTASSIUM CHLORIDE CRYS ER 20 MEQ PO TBCR
40.0000 meq | EXTENDED_RELEASE_TABLET | Freq: Once | ORAL | Status: AC
  Administered 2023-04-23: 40 meq via ORAL
  Filled 2023-04-23: qty 2

## 2023-04-23 MED ORDER — LOPERAMIDE HCL 2 MG PO CAPS: 2.0000 mg | ORAL_CAPSULE | Freq: Two times a day (BID) | ORAL | Status: DC | PRN

## 2023-04-23 MED ORDER — ATORVASTATIN CALCIUM 80 MG PO TABS
80.0000 mg | ORAL_TABLET | Freq: Every evening | ORAL | Status: DC
  Administered 2023-04-23: 80 mg via ORAL
  Filled 2023-04-23: qty 4

## 2023-04-23 MED ORDER — AMLODIPINE BESYLATE 10 MG PO TABS
10.0000 mg | ORAL_TABLET | Freq: Every evening | ORAL | Status: DC
  Administered 2023-04-23: 10 mg via ORAL
  Filled 2023-04-23: qty 1

## 2023-04-23 MED ORDER — EZETIMIBE 10 MG PO TABS
10.0000 mg | ORAL_TABLET | Freq: Every day | ORAL | Status: DC
  Administered 2023-04-23 – 2023-04-24 (×2): 10 mg via ORAL
  Filled 2023-04-23 (×2): qty 1

## 2023-04-23 NOTE — Care Management Obs Status (Signed)
 MEDICARE OBSERVATION STATUS NOTIFICATION   Patient Details  Name: Tami Jones MRN: [id number] Date of Birth: 1955/09/24   Medicare Observation Status Notification Given:  No (Form explained and patient refused to sign form)    Bing Quarry, RN 04/23/2023, 5:06 PM

## 2023-04-23 NOTE — Plan of Care (Signed)
  Problem: Education: Goal: Knowledge of General Education information will improve Description: Including pain rating scale, medication(s)/side effects and non-pharmacologic comfort measures 04/23/2023 2129 by Dorthula Nettles, RN Outcome: Progressing 04/23/2023 2125 by Dorthula Nettles, RN Outcome: Progressing 04/23/2023 2123 by Dorthula Nettles, RN Outcome: Progressing   Problem: Health Behavior/Discharge Planning: Goal: Ability to manage health-related needs will improve 04/23/2023 2129 by Dorthula Nettles, RN Outcome: Progressing 04/23/2023 2125 by Dorthula Nettles, RN Outcome: Progressing 04/23/2023 2123 by Dorthula Nettles, RN Outcome: Progressing   Problem: Clinical Measurements: Goal: Ability to maintain clinical measurements within normal limits will improve 04/23/2023 2129 by Dorthula Nettles, RN Outcome: Progressing 04/23/2023 2125 by Dorthula Nettles, RN Outcome: Progressing 04/23/2023 2123 by Dorthula Nettles, RN Outcome: Progressing Goal: Will remain free from infection 04/23/2023 2129 by Dorthula Nettles, RN Outcome: Progressing 04/23/2023 2125 by Dorthula Nettles, RN Outcome: Progressing 04/23/2023 2123 by Dorthula Nettles, RN Outcome: Progressing Goal: Diagnostic test results will improve 04/23/2023 2129 by Dorthula Nettles, RN Outcome: Progressing 04/23/2023 2125 by Dorthula Nettles, RN Outcome: Progressing 04/23/2023 2123 by Dorthula Nettles, RN Outcome: Progressing Goal: Respiratory complications will improve 04/23/2023 2129 by Dorthula Nettles, RN Outcome: Progressing 04/23/2023 2125 by Dorthula Nettles, RN Outcome: Progressing 04/23/2023 2123 by Dorthula Nettles, RN Outcome: Progressing Goal: Cardiovascular complication will be avoided 04/23/2023 2129 by Dorthula Nettles, RN Outcome: Progressing 04/23/2023 2125 by Dorthula Nettles, RN Outcome: Progressing 04/23/2023 2123 by Dorthula Nettles, RN Outcome: Progressing   Problem: Activity: Goal: Risk for activity intolerance will decrease 04/23/2023 2129 by Dorthula Nettles, RN Outcome:  Progressing 04/23/2023 2125 by Dorthula Nettles, RN Outcome: Progressing 04/23/2023 2123 by Dorthula Nettles, RN Outcome: Progressing   Problem: Nutrition: Goal: Adequate nutrition will be maintained 04/23/2023 2129 by Dorthula Nettles, RN Outcome: Progressing 04/23/2023 2125 by Dorthula Nettles, RN Outcome: Progressing 04/23/2023 2123 by Dorthula Nettles, RN Outcome: Progressing   Problem: Coping: Goal: Level of anxiety will decrease 04/23/2023 2129 by Dorthula Nettles, RN Outcome: Progressing 04/23/2023 2125 by Dorthula Nettles, RN Outcome: Progressing 04/23/2023 2123 by Dorthula Nettles, RN Outcome: Progressing   Problem: Elimination: Goal: Will not experience complications related to bowel motility 04/23/2023 2129 by Dorthula Nettles, RN Outcome: Progressing 04/23/2023 2125 by Dorthula Nettles, RN Outcome: Progressing 04/23/2023 2123 by Dorthula Nettles, RN Outcome: Progressing Goal: Will not experience complications related to urinary retention 04/23/2023 2129 by Dorthula Nettles, RN Outcome: Progressing 04/23/2023 2125 by Dorthula Nettles, RN Outcome: Progressing 04/23/2023 2123 by Dorthula Nettles, RN Outcome: Progressing   Problem: Pain Managment: Goal: General experience of comfort will improve and/or be controlled 04/23/2023 2129 by Dorthula Nettles, RN Outcome: Progressing 04/23/2023 2125 by Dorthula Nettles, RN Outcome: Progressing 04/23/2023 2123 by Dorthula Nettles, RN Outcome: Progressing   Problem: Safety: Goal: Ability to remain free from injury will improve 04/23/2023 2129 by Dorthula Nettles, RN Outcome: Progressing 04/23/2023 2125 by Dorthula Nettles, RN Outcome: Progressing 04/23/2023 2123 by Dorthula Nettles, RN Outcome: Progressing   Problem: Skin Integrity: Goal: Risk for impaired skin integrity will decrease 04/23/2023 2129 by Dorthula Nettles, RN Outcome: Progressing 04/23/2023 2125 by Dorthula Nettles, RN Outcome: Progressing 04/23/2023 2123 by Dorthula Nettles, RN Outcome: Progressing

## 2023-04-23 NOTE — Plan of Care (Signed)

## 2023-04-23 NOTE — Plan of Care (Signed)

## 2023-04-23 NOTE — Evaluation (Signed)
 Occupational Therapy Evaluation Patient Details Name: Tami Jones MRN: [id number] DOB: 07-01-1955 Today's Date: 04/23/2023   History of Present Illness   68 y.o. female with medical history significant of HTN,  HLD, asthma, dCHF, stroke with right sided weakness, GERD, depression with anxiety, CKD-3A, obesity, who presents with fever, cough, weakness, diarrhea.     Clinical Impressions Patient presenting with decrease Ind in self care,balance, functional mobility/transfers, endurance, and safety awareness. Patient reports living at home with family and performing self care tasks at [facility] I level with use of RW. She has recently been getting HH therapies after recent hospital admission.Patient currently functioning at min guard- min A with use of RW for functional mobility and toileting needs. Pt does fatigue quickly and returns to bed at end of session.  Patient will benefit from acute OT to increase overall independence in the areas of ADLs, functional mobility, and safety awareness in order to safely discharge.     If plan is discharge home, recommend the following:   Direct supervision/assist for financial management;Direct supervision/assist for medications management;A little help with walking and/or transfers;Assistance with cooking/housework;A little help with bathing/dressing/bathroom     Functional Status Assessment   Patient has had a recent decline in their functional status and demonstrates the ability to make significant improvements in function in a reasonable and predictable amount of time.     Equipment Recommendations   None recommended by OT      Precautions/Restrictions   Precautions Precautions: Fall     Mobility Bed Mobility Overal bed mobility: Needs Assistance Bed Mobility: Supine to Sit, Sit to Supine     Supine to sit: Supervision Sit to supine: Supervision        Transfers Overall transfer level: Needs assistance Equipment used: Rolling  walker (2 wheels) Transfers: Sit to/from Stand, Bed to chair/wheelchair/BSC Sit to Stand: Contact guard assist     Step pivot transfers: Contact guard assist            Balance Overall balance assessment: Needs assistance Sitting-balance support: Feet supported, No upper extremity supported Sitting balance-Leahy Scale: Good     Standing balance support: During functional activity, Reliant on assistive device for balance, Single extremity supported Standing balance-Leahy Scale: Fair                             ADL either performed or assessed with clinical judgement   ADL Overall ADL's : Needs assistance/impaired                         Toilet Transfer: Rolling walker (2 wheels);Contact guard assist   Toileting- Clothing Manipulation and Hygiene: Contact guard assist;Sit to/from stand       Functional mobility during ADLs: Rolling walker (2 wheels);Contact guard assist       Vision Baseline Vision/History: 1 Wears glasses Patient Visual Report: No change from baseline              Pertinent Vitals/Pain Pain Assessment Pain Assessment: No/denies pain     Extremity/Trunk Assessment Upper Extremity Assessment Upper Extremity Assessment: Right hand dominant;Generalized weakness RUE Deficits / Details: prior CVA LUE Deficits / Details: prior CVA           Communication Communication Communication: No apparent difficulties   Cognition Arousal: Alert Behavior During Therapy: WFL for tasks assessed/performed Cognition: Cognition impaired     Awareness: Online awareness impaired, Intellectual awareness intact   Attention  impairment (select first level of impairment): Sustained attention                     Following commands: Intact Following commands impaired: Only follows one step commands consistently     Cueing  General Comments   Cueing Techniques: Verbal cues;Tactile cues              Home Living  Family/patient expects to be discharged to:: Private residence Living Arrangements: Children Available Help at Discharge: Family;Available PRN/intermittently Type of Home: House Home Access: Stairs to enter [employer] of Steps: 3 from back (typical entrance) with L railing and use of SPC; 5 STE from front with B railings Entrance Stairs-Rails: Right;Left Home Layout: One level     Bathroom Shower/Tub: Chief Strategy Officer: Standard Bathroom Accessibility: Yes   Home Equipment: Grab bars - tub/shower;Grab bars - toilet;Cane - single point;Rolling Walker (2 wheels)   Additional Comments: lives with daughter, son in Social worker, and grandchildren  Lives With: Family    Prior Functioning/Environment Prior Level of Function : Independent/Modified Independent               ADLs Comments: daughter provides meals and iADLs, pt independent with ADLs. She has been getting HHOT and PT recently. Pt has been using RW for mobility.    OT Problem List: Impaired balance (sitting and/or standing);Decreased safety awareness;Obesity;Decreased strength;Decreased coordination   OT Treatment/Interventions: Self-care/ADL training;Visual/perceptual remediation/compensation;Therapeutic exercise;Patient/family education;Neuromuscular education;Therapeutic activities;DME and/or AE instruction;Cognitive remediation/compensation      OT Goals(Current goals can be found in the care plan section)   Acute Rehab OT Goals Patient Stated Goal: to go home OT Goal Formulation: With patient Time For Goal Achievement: 05/07/23 Potential to Achieve Goals: Good ADL Goals Pt Will Perform Grooming: with modified independence;standing Pt Will Perform Lower Body Dressing: with modified independence;sit to/from stand Pt Will Transfer to Toilet: with modified independence;stand pivot transfer Pt Will Perform Toileting - Clothing Manipulation and hygiene: with modified independence;sit to/from  stand   OT Frequency:  Min 2X/week       AM-PAC OT "6 Clicks" Daily Activity     Outcome Measure Help from another person eating meals?: None Help from another person taking care of personal grooming?: A Little Help from another person toileting, which includes using toliet, bedpan, or urinal?: A Little Help from another person bathing (including washing, rinsing, drying)?: A Little Help from another person to put on and taking off regular upper body clothing?: A Little Help from another person to put on and taking off regular lower body clothing?: A Little 6 Click Score: 19   End of Session Equipment Utilized During Treatment: Rolling walker (2 wheels) Nurse Communication: Mobility status  Activity Tolerance: Patient tolerated treatment well Patient left: with call bell/phone within reach;in bed;with bed alarm set  OT Visit Diagnosis: Unsteadiness on feet (R26.81);Muscle weakness (generalized) (M62.81);Hemiplegia and hemiparesis Hemiplegia - Right/Left: Left Hemiplegia - dominant/non-dominant: Non-Dominant Hemiplegia - caused by: Cerebral infarction                Time: 2956-2130 OT Time Calculation (min): 22 min Charges:  OT General Charges $OT Visit: 1 Visit OT Evaluation $OT Eval Low Complexity: 1 Low OT Treatments $Self Care/Home Management : 8-22 mins  Jackquline Denmark, MS, OTR/L , CBIS ascom [phone]  04/23/23, 3:28 PM

## 2023-04-23 NOTE — Progress Notes (Signed)
 PROGRESS NOTE    Tami Jones  YQI:[id number] DOB: March 22, 1955 DOA: 04/22/2023 PCP: Karie Schwalbe, MD   Assessment & Plan:   Principal Problem:   Influenza A Active Problems:   Hypoglycemia   Diarrhea   Dyslipidemia   HTN (hypertension)   Type II diabetes mellitus with renal manifestations (HCC)   History of CVA with residual deficit   CKD (chronic kidney disease), stage IIIa   Depression with anxiety   Obesity (BMI 30-39.9)  Assessment and Plan: Influenza A infection: No oxygen desaturation. Continue on tamiflu, bronchodilators & encourage incentive spirometry. Droplet precautions    Hypoglycemia: likely secondary to glipizide which is on hold. Much improved    Diarrhea: likely secondary to influenza. Continue w/ supportive care  Hypokalemia: potassium given     HLD: continue on statin, zetia   HTN: continue on amlodipine, metoprolol   DM2: well controlled, HbA1c 6.8. W/ intermittent hypoglycemic episodes. Holding glipizide    Hx of CVA: w/ residual deficit. Continue on aspirin, plavix, statin  CKDIIIa: baseline Cr 1.2-1.4. Avoid nephrotoxic meds    Anxiety: severity unknown. Continue on home dose of xanax    Obesity: BMI 36.5. Would benefit from weight loss   Thrombocytopenia: etiology unclear. Will continue to monitor       DVT prophylaxis: lovenox  Code Status: full  Family Communication:  Disposition Plan: likely d/c back home  Level of care: Telemetry Medical  Status is: Observation The patient remains OBS appropriate and will d/c before 2 midnights.    Consultants:    Procedures:   Antimicrobials:    Subjective: Pt c/o diarrhea & malaise  Objective: Vitals:   04/22/23 2120 04/23/23 0004 04/23/23 0500 04/23/23 0810  BP: (!) 144/73 (!) 161/90  110/67  Pulse: 83 81  85  Resp: 17 18  17   Temp:  98.4 F (36.9 C)  98 F (36.7 C)  TempSrc:  Oral    SpO2: 97% 98%  97%  Weight:   96.5 kg   Height:        Intake/Output Summary  (Last 24 hours) at 04/23/2023 0851 Last data filed at 04/22/2023 2241 Gross per 24 hour  Intake 1000 ml  Output --  Net 1000 ml   Filed Weights   04/22/23 1611 04/23/23 0500  Weight: 93.9 kg 96.5 kg    Examination:  General exam: Appears calm but uncomfortable  Respiratory system: decreased breath sounds b/l  Cardiovascular system: S1 & S2+. No rubs, gallops or clicks. Gastrointestinal system: Abdomen is nondistended, soft and nontender. Hyperactive bowel sounds heard. Central nervous system: Alert and oriented. Moves all extremities  Psychiatry: Judgement and insight appear normal. Flat mood and affect    Data Reviewed: I have personally reviewed following labs and imaging studies  CBC: Recent Labs  Lab 04/22/23 1702 04/23/23 0347  WBC 5.2 3.4*  NEUTROABS 3.8  --   HGB 14.4 12.5  HCT 43.1 37.3  MCV 93.7 91.2  PLT 161 144*   Basic Metabolic Panel: Recent Labs  Lab 04/22/23 1702 04/23/23 0347  NA 137 138  K 3.6 3.2*  CL 105 105  CO2 19* 21*  GLUCOSE 69* 72  BUN 19 22  CREATININE 1.44* 1.36*  CALCIUM 8.6* 8.0*   GFR: Estimated Creatinine Clearance: 45.9 mL/min (A) (by C-G formula based on SCr of 1.36 mg/dL (H)). Liver Function Tests: Recent Labs  Lab 04/22/23 1702  AST 58*  ALT 42  ALKPHOS 65  BILITOT 0.9  PROT 6.8  ALBUMIN  3.7   No results for input(s): "LIPASE", "AMYLASE" in the last 168 hours. No results for input(s): "AMMONIA" in the last 168 hours. Coagulation Profile: Recent Labs  Lab 04/22/23 1702  INR 1.1   Cardiac Enzymes: No results for input(s): "CKTOTAL", "CKMB", "CKMBINDEX", "TROPONINI" in the last 168 hours. BNP (last 3 results) No results for input(s): "PROBNP" in the last 8760 hours. HbA1C: No results for input(s): "HGBA1C" in the last 72 hours. CBG: Recent Labs  Lab 04/23/23 0047 04/23/23 0210 04/23/23 0406 04/23/23 0603 04/23/23 0809  GLUCAP 212* 141* 62* 96 119*   Lipid Profile: No results for input(s): "CHOL",  "HDL", "LDLCALC", "TRIG", "CHOLHDL", "LDLDIRECT" in the last 72 hours. Thyroid Function Tests: No results for input(s): "TSH", "T4TOTAL", "FREET4", "T3FREE", "THYROIDAB" in the last 72 hours. Anemia Panel: No results for input(s): "VITAMINB12", "FOLATE", "FERRITIN", "TIBC", "IRON", "RETICCTPCT" in the last 72 hours. Sepsis Labs: Recent Labs  Lab 04/22/23 1702  LATICACIDVEN 1.2    Recent Results (from the past 240 hours)  Resp panel by RT-PCR (RSV, Flu A&B, Covid) Anterior Nasal Swab     Status: Abnormal   Collection Time: 04/22/23  4:18 PM   Specimen: Anterior Nasal Swab  Result Value Ref Range Status   SARS Coronavirus 2 by RT PCR NEGATIVE NEGATIVE Final    Comment: (NOTE) SARS-CoV-2 target nucleic acids are NOT DETECTED.  The SARS-CoV-2 RNA is generally detectable in upper respiratory specimens during the acute phase of infection. The lowest concentration of SARS-CoV-2 viral copies this assay can detect is 138 copies/mL. A negative result does not preclude SARS-Cov-2 infection and should not be used as the sole basis for treatment or other patient management decisions. A negative result may occur with  improper specimen collection/handling, submission of specimen other than nasopharyngeal swab, presence of viral mutation(s) within the areas targeted by this assay, and inadequate number of viral copies(<138 copies/mL). A negative result must be combined with clinical observations, patient history, and epidemiological information. The expected result is Negative.  Fact Sheet for Patients:  BloggerCourse.com  Fact Sheet for Healthcare Providers:  SeriousBroker.it  This test is no t yet approved or cleared by the [place] FDA and  has been authorized for detection and/or diagnosis of SARS-CoV-2 by FDA under an Emergency Use Authorization (EUA). This EUA will remain  in effect (meaning this test can be used) for the  duration of the COVID-19 declaration under Section 564(b)(1) of the Act, 21 U.S.C.section 360bbb-3(b)(1), unless the authorization is terminated  or revoked sooner.       Influenza A by PCR POSITIVE (A) NEGATIVE Final   Influenza B by PCR NEGATIVE NEGATIVE Final    Comment: (NOTE) The Xpert Xpress SARS-CoV-2/FLU/RSV plus assay is intended as an aid in the diagnosis of influenza from Nasopharyngeal swab specimens and should not be used as a sole basis for treatment. Nasal washings and aspirates are unacceptable for Xpert Xpress SARS-CoV-2/FLU/RSV testing.  Fact Sheet for Patients: BloggerCourse.com  Fact Sheet for Healthcare Providers: SeriousBroker.it  This test is not yet approved or cleared by the [place] FDA and has been authorized for detection and/or diagnosis of SARS-CoV-2 by FDA under an Emergency Use Authorization (EUA). This EUA will remain in effect (meaning this test can be used) for the duration of the COVID-19 declaration under Section 564(b)(1) of the Act, 21 U.S.C. section 360bbb-3(b)(1), unless the authorization is terminated or revoked.     Resp Syncytial Virus by PCR NEGATIVE NEGATIVE Final    Comment: (NOTE)  Fact Sheet for Patients: BloggerCourse.com  Fact Sheet for Healthcare Providers: SeriousBroker.it  This test is not yet approved or cleared by the [place] FDA and has been authorized for detection and/or diagnosis of SARS-CoV-2 by FDA under an Emergency Use Authorization (EUA). This EUA will remain in effect (meaning this test can be used) for the duration of the COVID-19 declaration under Section 564(b)(1) of the Act, 21 U.S.C. section 360bbb-3(b)(1), unless the authorization is terminated or revoked.  Performed at [facility], [address] [place], [place] [address]   Blood Culture (routine x 2)     Status: None  (Preliminary result)   Collection Time: 04/22/23  5:02 PM   Specimen: BLOOD RIGHT HAND  Result Value Ref Range Status   Specimen Description BLOOD RIGHT HAND  Final   Special Requests   Final    BOTTLES DRAWN AEROBIC AND ANAEROBIC Blood Culture adequate volume   Culture   Final    NO GROWTH < 12 HOURS Performed at [facility], [address], [place], [place] [address]    Report Status PENDING  Incomplete  Blood Culture (routine x 2)     Status: None (Preliminary result)   Collection Time: 04/23/23 12:54 AM   Specimen: BLOOD LEFT ARM  Result Value Ref Range Status   Specimen Description BLOOD LEFT ARM  Final   Special Requests   Final    BOTTLES DRAWN AEROBIC AND ANAEROBIC Blood Culture adequate volume   Culture   Final    NO GROWTH < 12 HOURS Performed at [facility], [address] [place], [place] [address]    Report Status PENDING  Incomplete         Radiology Studies: DG Chest Port 1 View Result Date: 04/22/2023 CLINICAL DATA:  Weakness x2 days. EXAM: PORTABLE CHEST 1 VIEW COMPARISON:  December 12, 2022 FINDINGS: The heart size and mediastinal contours are within normal limits. Low lung volumes are noted. Mild atelectasis and/or infiltrate is seen within the left lung base. No pleural effusion or pneumothorax is identified. Multilevel degenerative changes seen throughout the thoracic spine. IMPRESSION: Low lung volumes with mild left basilar atelectasis and/or infiltrate. Electronically Signed   By: Aram Candela M.D.   On: 04/22/2023 20:36        Scheduled Meds:  amLODipine  10 mg Oral QPM   aspirin EC  81 mg Oral QPM   atorvastatin  80 mg Oral QPM   clopidogrel  75 mg Oral Daily   enoxaparin (LOVENOX) injection  45 mg Subcutaneous Q24H   ezetimibe  10 mg Oral Daily   metoprolol succinate  50 mg Oral Daily   oseltamivir  30 mg Oral BID   potassium chloride  40 mEq Oral Once   Continuous Infusions:   LOS: 0 days       Charise Killian, MD [facility] Pager [phone]  If 7PM-7AM, please contact night-coverage [email or web address] 04/23/2023, 8:51 AM

## 2023-04-24 DIAGNOSIS — J101 Influenza due to other identified influenza virus with other respiratory manifestations: Secondary | ICD-10-CM | POA: Diagnosis not present

## 2023-04-24 LAB — CBC
HCT: 40 % (ref 36.0–46.0)
Hemoglobin: 13.5 g/dL (ref 12.0–15.0)
MCH: 31.3 pg (ref 26.0–34.0)
MCHC: 33.8 g/dL (ref 30.0–36.0)
MCV: 92.6 fL (ref 80.0–100.0)
Platelets: 152 10*3/uL (ref 150–400)
RBC: 4.32 MIL/uL (ref 3.87–5.11)
RDW: 12.8 % (ref 11.5–15.5)
WBC: 2.8 10*3/uL — ABNORMAL LOW (ref 4.0–10.5)
nRBC: 0 % (ref 0.0–0.2)

## 2023-04-24 LAB — GLUCOSE, CAPILLARY
Glucose-Capillary: 113 mg/dL — ABNORMAL HIGH (ref 70–99)
Glucose-Capillary: 119 mg/dL — ABNORMAL HIGH (ref 70–99)
Glucose-Capillary: 143 mg/dL — ABNORMAL HIGH (ref 70–99)
Glucose-Capillary: 159 mg/dL — ABNORMAL HIGH (ref 70–99)
Glucose-Capillary: 134 mg/dL — ABNORMAL HIGH (ref 70–99)

## 2023-04-24 LAB — BASIC METABOLIC PANEL
Anion gap: 7 (ref 5–15)
BUN: 17 mg/dL (ref 8–23)
CO2: 22 mmol/L (ref 22–32)
Calcium: 8.3 mg/dL — ABNORMAL LOW (ref 8.9–10.3)
Chloride: 109 mmol/L (ref 98–111)
Creatinine, Ser: 1.08 mg/dL — ABNORMAL HIGH (ref 0.44–1.00)
GFR, Estimated: 57 mL/min — ABNORMAL LOW (ref >60)
Glucose, Bld: 119 mg/dL — ABNORMAL HIGH (ref 70–99)
Potassium: 4 mmol/L (ref 3.5–5.1)
Sodium: 138 mmol/L (ref 135–145)

## 2023-04-24 MED ORDER — GUAIFENESIN-DM 100-10 MG/5ML PO SYRP: 5.0000 mL | ORAL_SOLUTION | ORAL | 0 refills | Status: DC | PRN

## 2023-04-24 NOTE — Progress Notes (Signed)
D/C AVS completed and reviewed with pt. All opportunities for questions answered and clarified. IV removed. Pt will be wheeled down to car at medical mall entrance via wheelchair.

## 2023-04-24 NOTE — Discharge Summary (Signed)
 Physician Discharge Summary  Tami Jones OZH:[id number] DOB: Jun 26, 1955 DOA: 04/22/2023  PCP: Karie Schwalbe, MD  Admit date: 04/22/2023 Discharge date: 04/24/2023  Admitted From: home  Disposition:  home  Recommendations for Outpatient Follow-up:  Follow up with PCP in 1-2 weeks Get CBC w/in 1-2 weeks to check WBC   Home Health:  Equipment/Devices:  Discharge Condition: stable  CODE STATUS: full  Diet recommendation: Heart Healthy / Carb Modified  Brief/Interim Summary: HPI was taken from Dr. Clyde Lundborg: Tami Jones is a 68 y.o. female with medical history significant of HTN,  HLD, asthma, dCHF, stroke with right sided weakness, GERD, depression with anxiety, CKD-3A, obesity, who presents with fever, cough, weakness, diarrhea.   Patient states that she has been sick in the past 2 days.  She has cough with little mucus production, fever, chills, body aches, poor appetite, decreased oral intake, nasal congestion, no chest pain or SOB.  Patient has had 2 episode of diarrhea today.  No nausea, vomiting, abdominal pain.  No symptoms of UTI.  She has generalized weakness. Today she was so weak that when she tried to get up out of the recliner she just sort of slid down to the floor and could not get up due to fatigue.  Her daughter said the patient did not have any injury.  No loss of consciousness.   Patient was found to have hypoglycemia with blood sugar 69 --> 40 in ED.   Data reviewed independently and ED Course: pt was found to have positive PCR for flu A, lactic acid 1.2, INR 1.1, PTT 30, renal function close to baseline, temperature 102.9, blood pressure 144/73, heart rate 118, 83, RR 26, oxygen saturation 94% on room air.  Chest x-ray showed mild left basilar atelectasis versus mild infiltration.  Patient is placed on telemetry bed for observation.    Discharge Diagnoses:  Principal Problem:   Influenza A Active Problems:   Hypoglycemia   Diarrhea   Dyslipidemia   HTN  (hypertension)   Type II diabetes mellitus with renal manifestations (HCC)   History of CVA with residual deficit   CKD (chronic kidney disease), stage IIIa   Depression with anxiety   Obesity (BMI 30-39.9)  Influenza A infection: No oxygen desaturation. Continue on tamiflu, bronchodilators & encourage incentive spirometry. Droplet precautions    Hypoglycemia: likely secondary to glipizide which is on hold. Resolved    Diarrhea: likely secondary to influenza. Continue w/ supportive care   Hypokalemia: WNL today    HLD: continue on statin, zetia   HTN: continue on amlodipine, metoprolol   DM2: well controlled, HbA1c 6.8. W/ intermittent hypoglycemic episodes. Holding glipizide    Hx of CVA: w/ residual deficit. Continue on aspirin, plavix, statin   CKDIIIa: baseline Cr 1.2-1.4. Avoid nephrotoxic meds    Anxiety: severity unknown. Continue on home dose of xanax    Obesity: BMI 36.5. Would benefit from weight loss    Thrombocytopenia: resolved   Discharge Instructions  Discharge Instructions     Diet - low sodium heart healthy   Complete by: As directed    Diet Carb Modified   Complete by: As directed    Discharge instructions   Complete by: As directed    F/u w/ PCP in 1-2 weeks. Get a CBC within 1-2 weeks to check WBC level   Increase activity slowly   Complete by: As directed       Allergies as of 04/24/2023       Reactions  Citalopram Other (See Comments)   Feels odd   Doxycycline Hyclate Nausea And Vomiting   Erythromycin Other (See Comments)   Irritated stomach   Montelukast Sodium Itching   Nitrofurantoin Nausea And Vomiting   Sulfa Antibiotics Nausea And Vomiting   Tramadol Hcl Other (See Comments)   Feels odd   Venlafaxine Hcl Other (See Comments)   Feels odd   Amoxicillin-pot Clavulanate Nausea And Vomiting   Clarithromycin Rash    See ER note 02/2017   Gabapentin    Makes her feel off.    Lisinopril    Makes patient feel bad, and some noted  swelling in ankles but not mouth, face, tongue.         Medication List     TAKE these medications    acetaminophen 325 MG tablet Commonly known as: TYLENOL Take 325 mg by mouth as needed for mild pain (pain score 1-3) or moderate pain (pain score 4-6).   ALPRAZolam 0.25 MG tablet Commonly known as: XANAX Take 1 tablet (0.25 mg total) by mouth 3 (three) times daily as needed for anxiety or sleep. What changed:  how much to take when to take this   amLODipine 10 MG tablet Commonly known as: NORVASC Take 1 tablet (10 mg total) by mouth daily. What changed: when to take this   aspirin EC 81 MG tablet Take 81 mg by mouth every evening.   atorvastatin 80 MG tablet Commonly known as: LIPITOR TAKE 1 TABLET BY MOUTH EVERY EVENING   clopidogrel 75 MG tablet Commonly known as: PLAVIX TAKE 1 TABLET BY MOUTH EVERY DAY   ezetimibe 10 MG tablet Commonly known as: ZETIA Take 1 tablet (10 mg total) by mouth daily.   glipiZIDE 5 MG 24 hr tablet Commonly known as: GLUCOTROL XL TAKE 1 TABLET (5 MG TOTAL) BY MOUTH IN THE MORNING AND AT BEDTIME   glucose blood test strip Use daily as instructed   guaiFENesin-dextromethorphan 100-10 MG/5ML syrup Commonly known as: ROBITUSSIN DM Take 5 mLs by mouth every 4 (four) hours as needed for up to 14 days for cough.   ketoconazole 2 % cream Commonly known as: NIZORAL Apply 1 Application topically 2 (two) times daily as needed for irritation. What changed: when to take this   lidocaine 5 % ointment Commonly known as: XYLOCAINE Apply 1 Application topically as needed.   metoprolol succinate 25 MG 24 hr tablet Commonly known as: TOPROL-XL Take 2 tablets (50 mg total) by mouth daily.   ondansetron 4 MG disintegrating tablet Commonly known as: ZOFRAN-ODT Take 1 tablet (4 mg total) by mouth every 6 (six) hours as needed for nausea or vomiting.   oseltamivir 30 MG capsule Commonly known as: Tamiflu Take 1 capsule (30 mg total) by  mouth daily.   pantoprazole 40 MG tablet Commonly known as: PROTONIX Take 40 mg by mouth daily as needed (heartburn).   TUMS PO Take 2 tablets by mouth daily as needed (heartburn).        Follow-up Information     Karie Schwalbe, MD. Schedule an appointment as soon as possible for a visit on 04/25/2023.   Specialties: Internal Medicine, Pediatrics Why: For ER visit follow-up and reevaluation. Contact information: [address] [place] [address] [phone]                Allergies  Allergen Reactions   Citalopram Other (See Comments)    Feels odd   Doxycycline Hyclate Nausea And Vomiting   Erythromycin  Other (See Comments)    Irritated stomach   Montelukast Sodium Itching   Nitrofurantoin Nausea And Vomiting   Sulfa Antibiotics Nausea And Vomiting   Tramadol Hcl Other (See Comments)    Feels odd   Venlafaxine Hcl Other (See Comments)    Feels odd   Amoxicillin-Pot Clavulanate Nausea And Vomiting   Clarithromycin Rash     See ER note 02/2017   Gabapentin     Makes her feel off.    Lisinopril     Makes patient feel bad, and some noted swelling in ankles but not mouth, face, tongue.     Consultations:    Procedures/Studies: DG Chest Port 1 View Result Date: 04/22/2023 CLINICAL DATA:  Weakness x2 days. EXAM: PORTABLE CHEST 1 VIEW COMPARISON:  December 12, 2022 FINDINGS: The heart size and mediastinal contours are within normal limits. Low lung volumes are noted. Mild atelectasis and/or infiltrate is seen within the left lung base. No pleural effusion or pneumothorax is identified. Multilevel degenerative changes seen throughout the thoracic spine. IMPRESSION: Low lung volumes with mild left basilar atelectasis and/or infiltrate. Electronically Signed   By: Aram Candela M.D.   On: 04/22/2023 20:36   EP PPM/ICD IMPLANT Result Date: 04/15/2023 SURGEON:  Will Jorja Loa, MD   PREPROCEDURE DIAGNOSIS:  Cryptogenic stroke   POSTPROCEDURE  DIAGNOSIS: Cryptogenic stroke    PROCEDURES:  1. Implantable loop recorder implantation   INTRODUCTION:  Makailey Hodgkin Burback presents with a history of cryptogenic stroke The costs of loop recorder monitoring have been discussed with the patient. Appropriate time out was performed prior to the procedure.   DESCRIPTION OF PROCEDURE:  Informed written consent was obtained.  The patient required no sedation for the procedure today.  Mapping over the patient's chest was performed to identify the area where electrograms were most prominent for ILR recording.  This area was found to be the left parasternal region over the 4th intercostal space. The patients left chest was therefore prepped and draped in the usual sterile fashion. The skin overlying the left parasternal region was infiltrated with lidocaine for local analgesia.  A 0.5-cm incision was made over the left parasternal region over the 3rd intercostal space.  A subcutaneous ILR pocket was fashioned using a combination of sharp and blunt dissection.  A [employer] Reveal LINQ 2 (serial # [id number]) implantable loop recorder was then placed into the pocket  R waves were very prominent and measured 0.16mV.  Steri- Strips and a sterile dressing were then applied.  There were no early apparent complications.   CONCLUSIONS:  1. Successful implantation of a implantable loop recorder for a history of cryptogenic stroke  2. No early apparent complications. Will Jorja Loa, MD 04/15/2023 5:41 PM   VAS [place] LOWER EXTREMITY VENOUS (DVT) Result Date: 04/15/2023  Lower Venous DVT Study Patient Name:  Tami Jones  Date of Exam:   04/14/2023 Medical Rec #: [id number]       Accession #:    [id number] Date of Birth: December 25, 1955       Patient Gender: F Patient Age:   49 years Exam Location:  [facility] Procedure:      VAS [place] LOWER EXTREMITY VENOUS (DVT) Referring Phys: Scheryl Marten XU --------------------------------------------------------------------------------  Indications:  Stroke.  Risk Factors: None identified. Limitations: Poor ultrasound/tissue interface and patient pain tolerance. Comparison Study: No prior studies. Performing Technologist: Chanda Busing RVT  Examination Guidelines: A complete evaluation includes B-mode imaging, spectral Doppler, color Doppler, and power Doppler as needed of  all accessible portions of each vessel. Bilateral testing is considered an integral part of a complete examination. Limited examinations for reoccurring indications may be performed as noted. The reflux portion of the exam is performed with the patient in reverse Trendelenburg.  +---------+---------------+---------+-----------+----------+--------------+ RIGHT    CompressibilityPhasicitySpontaneityPropertiesThrombus Aging +---------+---------------+---------+-----------+----------+--------------+ CFV      Full           Yes      Yes                                 +---------+---------------+---------+-----------+----------+--------------+ SFJ      Full                                                        +---------+---------------+---------+-----------+----------+--------------+ FV Prox  Full                                                        +---------+---------------+---------+-----------+----------+--------------+ FV Mid   Full                                                        +---------+---------------+---------+-----------+----------+--------------+ FV DistalFull                                                        +---------+---------------+---------+-----------+----------+--------------+ PFV      Full                                                        +---------+---------------+---------+-----------+----------+--------------+ POP      Full           Yes      Yes                                 +---------+---------------+---------+-----------+----------+--------------+ PTV      Full                                                         +---------+---------------+---------+-----------+----------+--------------+ PERO     Full                                                        +---------+---------------+---------+-----------+----------+--------------+   +---------+---------------+---------+-----------+----------+--------------+ LEFT  CompressibilityPhasicitySpontaneityPropertiesThrombus Aging +---------+---------------+---------+-----------+----------+--------------+ CFV      Full           Yes      Yes                                 +---------+---------------+---------+-----------+----------+--------------+ SFJ      Full                                                        +---------+---------------+---------+-----------+----------+--------------+ FV Prox  Full                                                        +---------+---------------+---------+-----------+----------+--------------+ FV Mid   Full                                                        +---------+---------------+---------+-----------+----------+--------------+ FV Distal               Yes      Yes                                 +---------+---------------+---------+-----------+----------+--------------+ PFV      Full                                                        +---------+---------------+---------+-----------+----------+--------------+ POP      Full           Yes      Yes                                 +---------+---------------+---------+-----------+----------+--------------+ PTV      Full                                                        +---------+---------------+---------+-----------+----------+--------------+ PERO     Full                                                        +---------+---------------+---------+-----------+----------+--------------+     Summary: RIGHT: - There is no evidence of deep vein thrombosis in the lower extremity.  -  No cystic structure found in the popliteal fossa.  LEFT: - There is no evidence of deep vein thrombosis in the lower extremity. However, portions of this examination were limited- see technologist comments  above.  - No cystic structure found in the popliteal fossa.  *See table(s) above for measurements and observations. Electronically signed by Carolynn Sayers on 04/15/2023 at 7:13:09 AM.    Final    ECHOCARDIOGRAM COMPLETE BUBBLE STUDY Result Date: 04/13/2023    ECHOCARDIOGRAM REPORT   Patient Name:   Tami Jones Date of Exam: 04/13/2023 Medical Rec #:  [id number]      Height:       63.0 in Accession #:    [id number]     Weight:       213.0 lb Date of Birth:  1955/03/08      BSA:          1.986 m Patient Age:    67 years       BP:           208/67 mmHg Patient Gender: F              HR:           66 bpm. Exam Location:  Inpatient Procedure: 2D Echo, Cardiac Doppler, Color Doppler, Saline Contrast Bubble Study            and Intracardiac Opacification Agent (Both Spectral and Color Flow            Doppler were utilized during procedure). Indications:    Stroke  History:        Patient has prior history of Echocardiogram examinations, most                 recent 11/25/2022. Risk Factors:Hypertension, Diabetes and                 Dyslipidemia.  Sonographer:    Karma Ganja Referring Phys: [id number] PROSPER M AMPONSAH  Sonographer Comments: Technically difficult study due to poor echo windows and patient is obese. Image acquisition challenging due to patient body habitus. IMPRESSIONS  1. Left ventricular ejection fraction, by estimation, is 60 to 65%. The left ventricle has normal function. The left ventricle has no regional wall motion abnormalities. There is mild concentric left ventricular hypertrophy. Left ventricular diastolic parameters are consistent with Grade I diastolic dysfunction (impaired relaxation).  2. Right ventricular systolic function is normal. The right ventricular size is normal. There is normal  pulmonary artery systolic pressure.  3. Left atrial size was mildly dilated.  4. Bubble study low quality due to body habitus but appears negative. Agitated saline contrast bubble study was negative, with no evidence of any interatrial shunt.  5. The mitral valve is normal in structure. Trivial mitral valve regurgitation. No evidence of mitral stenosis.  6. The aortic valve is tricuspid. There is mild calcification of the aortic valve. Aortic valve regurgitation is not visualized. Aortic valve sclerosis/calcification is present, without any evidence of aortic stenosis. FINDINGS  Left Ventricle: Left ventricular ejection fraction, by estimation, is 60 to 65%. The left ventricle has normal function. The left ventricle has no regional wall motion abnormalities. Definity contrast agent was given IV to delineate the left ventricular  endocardial borders. Strain imaging was not performed. The left ventricular internal cavity size was normal in size. There is mild concentric left ventricular hypertrophy. Left ventricular diastolic parameters are consistent with Grade I diastolic dysfunction (impaired relaxation). Right Ventricle: The right ventricular size is normal. No increase in right ventricular wall thickness. Right ventricular systolic function is normal. There is normal pulmonary artery systolic pressure. The tricuspid regurgitant velocity is 1.98 m/s, and  with an assumed right atrial pressure  of 3 mmHg, the estimated right ventricular systolic pressure is 18.7 mmHg. Left Atrium: Left atrial size was mildly dilated. Right Atrium: Right atrial size was normal in size. Pericardium: There is no evidence of pericardial effusion. Mitral Valve: The mitral valve is normal in structure. Mild mitral annular calcification. Trivial mitral valve regurgitation. No evidence of mitral valve stenosis. Tricuspid Valve: The tricuspid valve is normal in structure. Tricuspid valve regurgitation is not demonstrated. No evidence of  tricuspid stenosis. Aortic Valve: The aortic valve is tricuspid. There is mild calcification of the aortic valve. Aortic valve regurgitation is not visualized. Aortic valve sclerosis/calcification is present, without any evidence of aortic stenosis. Aortic valve mean gradient measures 2.0 mmHg. Aortic valve peak gradient measures 4.8 mmHg. Aortic valve area, by VTI measures 2.10 cm. Pulmonic Valve: The pulmonic valve was not well visualized. Pulmonic valve regurgitation is trivial. No evidence of pulmonic stenosis. Aorta: The aortic root is normal in size and structure. Venous: The inferior vena cava was not well visualized. IAS/Shunts: No atrial level shunt detected by color flow Doppler. Agitated saline contrast was given intravenously to evaluate for intracardiac shunting. Agitated saline contrast bubble study was negative, with no evidence of any interatrial shunt. Additional Comments: 3D imaging was not performed.  LEFT VENTRICLE PLAX 2D LVIDd:         4.50 cm   Diastology LVIDs:         3.00 cm   LV e' medial:    4.82 cm/s LV PW:         1.10 cm   LV E/e' medial:  12.9 LV IVS:        1.10 cm   LV e' lateral:   4.97 cm/s LVOT diam:     2.10 cm   LV E/e' lateral: 12.5 LV SV:         49 LV SV Index:   25 LVOT Area:     3.46 cm  RIGHT VENTRICLE RV S prime:     14.50 cm/s TAPSE (M-mode): 2.0 cm LEFT ATRIUM             Index LA diam:        3.90 cm 1.96 cm/m LA Vol (A2C):   58.8 ml 29.61 ml/m LA Vol (A4C):   74.9 ml 37.71 ml/m LA Biplane Vol: 69.4 ml 34.94 ml/m  AORTIC VALVE AV Area (Vmax):    2.15 cm AV Area (Vmean):   1.78 cm AV Area (VTI):     2.10 cm AV Vmax:           109.00 cm/s AV Vmean:          72.400 cm/s AV VTI:            0.233 m AV Peak Grad:      4.8 mmHg AV Mean Grad:      2.0 mmHg LVOT Vmax:         67.70 cm/s LVOT Vmean:        37.200 cm/s LVOT VTI:          0.141 m LVOT/AV VTI ratio: 0.61  AORTA Ao Root diam: 3.10 cm Ao Asc diam:  2.90 cm MITRAL VALVE               TRICUSPID VALVE MV Area  (PHT): 3.12 cm    TR Peak grad:   15.7 mmHg MV Decel Time: 243 msec    TR Vmax:        198.00 cm/s MV E velocity:  62.20 cm/s MV A velocity: 80.50 cm/s  SHUNTS MV E/A ratio:  0.77        Systemic VTI:  0.14 m                            Systemic Diam: 2.10 cm Arvilla Meres MD Electronically signed by Arvilla Meres MD Signature Date/Time: 04/13/2023/11:40:36 AM    Final    MR Cervical Spine Wo Contrast Result Date: 04/13/2023 CLINICAL DATA:  Ataxia, nontraumatic. Concern for cervical pathology. EXAM: MRI CERVICAL SPINE WITHOUT CONTRAST TECHNIQUE: Multiplanar, multisequence MR imaging of the cervical spine was performed. No intravenous contrast was administered. COMPARISON:  None Available. FINDINGS: Alignment: Straightening of the normal cervical lordosis. Trace retrolisthesis of C5 on C6. Vertebrae: Degenerative endplate changes at multiple levels with mild discogenic edema at C5-6. Vertebral body heights are maintained. No evidence of acute fracture. Small hemangioma in the T1 vertebral body. Cord: Normal signal and morphology. Posterior Fossa, vertebral arteries, paraspinal tissues: Paraspinal musculature is unremarkable. Visualized posterior fossa is unremarkable. Abnormal appearance of the right vertebral artery flow void at the V2 segment which could be related to atherosclerosis. Retropharyngeal course of the carotid arteries which indents the posterior wall of the oropharynx. Disc levels: C2-3: Increased T2/stir signal in the disc likely reflecting degenerative changes. No significant disc bulge. No significant spinal canal stenosis. Bilateral facet arthrosis. No significant foraminal stenosis. C3-4: Mild disc height loss. Disc osteophyte complex which indents the ventral thecal sac without contacting the spinal cord. Bilateral facet arthrosis and uncovertebral hypertrophy. Moderate right and mild-to-moderate left foraminal stenosis. C4-5: Disc osteophyte complex indents the ventral thecal sac without  contacting the spinal cord. Bilateral facet arthrosis. Uncovertebral hypertrophy greater on the left. Mild right and moderate left foraminal stenosis. C5-6: Mild disc height loss. Disc osteophyte complex indents the ventral thecal sac with mild flattening of the ventral cervical cord. Thickening of the ligamentum flavum. There is moderate spinal canal stenosis. No cord signal abnormality. Facet arthrosis and uncovertebral hypertrophy. Mild right and moderate left foraminal stenosis. C6-7: Disc bulge indents the ventral thecal sac without contacting the spinal cord. Bilateral facet arthrosis. No significant foraminal stenosis. C7-T1: No significant spinal canal stenosis. No significant foraminal stenosis. IMPRESSION: 1. Cervical spondylosis most significant at C5-C6 where there is moderate spinal canal stenosis and mild flattening of the ventral cord. No cord signal abnormality. 2. Moderate foraminal stenosis at multiple levels as above. 3. Abnormal appearance of the right vertebral artery V2 segment which could be related to atherosclerosis/stenosis. Consider CTA for further evaluation. Electronically Signed   By: Emily Filbert M.D.   On: 04/13/2023 09:03   MR BRAIN WO CONTRAST Result Date: 04/13/2023 CLINICAL DATA:  Neuro deficit, acute, stroke suspected. Left upper and lower extremity weakness. EXAM: MRI HEAD WITHOUT CONTRAST TECHNIQUE: Multiplanar, multiecho pulse sequences of the brain and surrounding structures were obtained without intravenous contrast. COMPARISON:  Head CT and CTA 04/12/2023 and MRI 11/24/2022 FINDINGS: Brain: Patchy acute cortical and subcortical infarcts are present in the right frontal greater than right parietal lobes, including involvement of the precentral gyrus. A few punctate acute cortical infarcts are also present in the right occipital lobe. Patchy T2 hyperintensities elsewhere in the cerebral white matter bilaterally and in the pons are similar to the prior MRI and are  nonspecific but compatible with moderate chronic small vessel ischemic disease. Chronic lacunar infarcts are again noted in the basal ganglia and deep cerebral white matter bilaterally. Chronic  bilateral PCA infarcts are again noted with associated chronic blood products. No age advanced global cerebral atrophy is evident. No mass, midline shift, hydrocephalus, or extra-axial fluid collection is identified. Vascular: Major intracranial vascular flow voids are preserved. Skull and upper cervical spine: Unremarkable bone marrow signal. Sinuses/Orbits: Unremarkable orbits. Paranasal sinuses and mastoid air cells are clear. Other: None. IMPRESSION: 1. Patchy acute right frontoparietal infarcts (MCA territory). 2. Punctate acute right occipital infarcts. 3. Moderate chronic small vessel ischemic disease. Chronic bilateral PCA infarcts. Electronically Signed   By: Sebastian Ache M.D.   On: 04/13/2023 08:11   CT Head Wo Contrast Result Date: 04/12/2023 CLINICAL DATA:  Initial evaluation for neuro deficit, stroke suspected. EXAM: CT ANGIOGRAPHY HEAD AND NECK WITH AND WITHOUT CONTRAST TECHNIQUE: Multidetector CT imaging of the head and neck was performed using the standard protocol during bolus administration of intravenous contrast. Multiplanar CT image reconstructions and MIPs were obtained to evaluate the vascular anatomy. Carotid stenosis measurements (when applicable) are obtained utilizing NASCET criteria, using the distal internal carotid diameter as the denominator. RADIATION DOSE REDUCTION: This exam was performed according to the departmental dose-optimization program which includes automated exposure control, adjustment of the mA and/or kV according to patient size and/or use of iterative reconstruction technique. CONTRAST:  75mL OMNIPAQUE IOHEXOL 350 MG/ML SOLN COMPARISON:  Prior study from 11/24/2022 FINDINGS: CT HEAD FINDINGS Brain: Cerebral volume within normal limits. Sequelae of chronic microvascular  ischemic disease with a few small remote lacunar infarcts about the corona radiata/basal ganglia. Chronic bilateral PCA distribution infarcts noted. No acute intracranial hemorrhage. No visible acute cortically based infarct. No mass lesion or midline shift. No hydrocephalus or extra-axial fluid collection. Vascular: No abnormal hyperdense vessel. Skull: Scalp soft tissues within normal limits.  Calvarium intact. Sinuses/Orbits: Globes and orbital soft tissues within normal limits. Paranasal sinuses and mastoid air cells are largely clear. Other: None. Review of the MIP images confirms the above findings CTA NECK FINDINGS Aortic arch: Mild aortic atherosclerosis. Standard branching. Imaged portion shows no evidence of aneurysm or dissection. No significant stenosis of the major arch vessel origins. Right carotid system: Right common and internal carotid arteries are patent without dissection. Mild atheromatous change about the right carotid bulb without hemodynamically significant greater than 50% stenosis. Left carotid system: Left common and internal carotid arteries are patent without dissection. Mild-to-moderate atheromatous change about the left carotid bulb without hemodynamically significant greater than 50% stenosis. Vertebral arteries: Both vertebral arteries arise from the subclavian arteries. Left vertebral artery strongly dominant and patent without stenosis or dissection. 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 Irregular attenuated intermittent opacification through the right V2 segment. Right vertebral artery is patent at the right V3 segment with subsequent wide patency as it courses into the cranial vault. Appearance is relatively similar to prior. Skeleton: No discrete or worrisome osseous lesions. Mild to moderate cervical spondylosis, most pronounced at C5-6. Scattered dental caries noted. Other neck: No other acute finding. Upper chest: No other acute  finding. Review of the MIP images confirms the above findings CTA HEAD FINDINGS Anterior circulation: Calcified atherosclerosis present about the carotid siphons bilaterally with estimated 50-60% stenosis bilaterally, similar to prior. A1 segments patent bilaterally, with the left being dominant. Normal anterior communicating artery complex. Atheromatous irregularity throughout the ACAs without high-grade stenosis. Atheromatous irregularity about the M1 segments without high-grade stenosis. No proximal MCA branch occlusion. Distal small vessel atheromatous irregularity throughout the MCA branches bilaterally. Appearance is relatively similar. Posterior circulation: Hypoplastic right  vertebral artery largely terminates in PICA. Distal right V4 segment minimally opacified with subsequent occlusion prior to the vertebrobasilar junction. Atherosclerotic change about the dominant left V4 segment with moderate multifocal stenoses, similar. Both PICA remain patent. Basilar is somewhat diminutive and diffusely irregular without focal high-grade stenosis. Superior cerebral arteries patent bilaterally. Atherosclerotic change about both PCAs which are irregular and attenuated in appearance. Moderate to severe stenoses noted at the left P2 segment and proximal right P1/P2 segments. PCAs remain patent to their distal aspects. Appearance is similar. Venous sinuses: Patent allowing for timing the contrast bolus. Anatomic variants: As above.  No aneurysm. Review of the MIP images confirms the above findings IMPRESSION: CT HEAD: 1. No acute intracranial abnormality. 2. Chronic bilateral PCA distribution infarcts. 3. Underlying chronic microvascular ischemic disease with a few remote lacunar infarcts about the bilateral basal ganglia/corona radiata. CTA HEAD AND NECK: 1. Negative CTA for large vessel occlusion or other emergent finding. 2. Diffuse intracranial atherosclerotic disease as detailed above, overall similar as compared to  prior CTA from 03/26/2022. 3. 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 Irregular intermittent opacification within the neck, but patent at the skull base, likely from collateral flow. Right vertebral artery functionally terminates in PICA. Moderate multifocal stenoses about the contralateral dominant left V4 segment. 4.  Aortic Atherosclerosis (ICD10-I70.0). Electronically Signed   By: Rise Mu M.D.   On: 04/12/2023 20:45   CT ANGIO HEAD NECK W WO CM Result Date: 04/12/2023 CLINICAL DATA:  Initial evaluation for neuro deficit, stroke suspected. EXAM: CT ANGIOGRAPHY HEAD AND NECK WITH AND WITHOUT CONTRAST TECHNIQUE: Multidetector CT imaging of the head and neck was performed using the standard protocol during bolus administration of intravenous contrast. Multiplanar CT image reconstructions and MIPs were obtained to evaluate the vascular anatomy. Carotid stenosis measurements (when applicable) are obtained utilizing NASCET criteria, using the distal internal carotid diameter as the denominator. RADIATION DOSE REDUCTION: This exam was performed according to the departmental dose-optimization program which includes automated exposure control, adjustment of the mA and/or kV according to patient size and/or use of iterative reconstruction technique. CONTRAST:  75mL OMNIPAQUE IOHEXOL 350 MG/ML SOLN COMPARISON:  Prior study from 11/24/2022 FINDINGS: CT HEAD FINDINGS Brain: Cerebral volume within normal limits. Sequelae of chronic microvascular ischemic disease with a few small remote lacunar infarcts about the corona radiata/basal ganglia. Chronic bilateral PCA distribution infarcts noted. No acute intracranial hemorrhage. No visible acute cortically based infarct. No mass lesion or midline shift. No hydrocephalus or extra-axial fluid collection. Vascular: No abnormal hyperdense vessel. Skull: Scalp soft tissues within normal limits.  Calvarium intact. Sinuses/Orbits: Globes and orbital  soft tissues within normal limits. Paranasal sinuses and mastoid air cells are largely clear. Other: None. Review of the MIP images confirms the above findings CTA NECK FINDINGS Aortic arch: Mild aortic atherosclerosis. Standard branching. Imaged portion shows no evidence of aneurysm or dissection. No significant stenosis of the major arch vessel origins. Right carotid system: Right common and internal carotid arteries are patent without dissection. Mild atheromatous change about the right carotid bulb without hemodynamically significant greater than 50% stenosis. Left carotid system: Left common and internal carotid arteries are patent without dissection. Mild-to-moderate atheromatous change about the left carotid bulb without hemodynamically significant greater than 50% stenosis. Vertebral arteries: Both vertebral arteries arise from the subclavian arteries. Left vertebral artery strongly dominant and patent without stenosis or dissection. 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 Irregular attenuated intermittent opacification through the right V2 segment.  Right vertebral artery is patent at the right V3 segment with subsequent wide patency as it courses into the cranial vault. Appearance is relatively similar to prior. Skeleton: No discrete or worrisome osseous lesions. Mild to moderate cervical spondylosis, most pronounced at C5-6. Scattered dental caries noted. Other neck: No other acute finding. Upper chest: No other acute finding. Review of the MIP images confirms the above findings CTA HEAD FINDINGS Anterior circulation: Calcified atherosclerosis present about the carotid siphons bilaterally with estimated 50-60% stenosis bilaterally, similar to prior. A1 segments patent bilaterally, with the left being dominant. Normal anterior communicating artery complex. Atheromatous irregularity throughout the ACAs without high-grade stenosis. Atheromatous irregularity about the  M1 segments without high-grade stenosis. No proximal MCA branch occlusion. Distal small vessel atheromatous irregularity throughout the MCA branches bilaterally. Appearance is relatively similar. Posterior circulation: Hypoplastic right vertebral artery largely terminates in PICA. Distal right V4 segment minimally opacified with subsequent occlusion prior to the vertebrobasilar junction. Atherosclerotic change about the dominant left V4 segment with moderate multifocal stenoses, similar. Both PICA remain patent. Basilar is somewhat diminutive and diffusely irregular without focal high-grade stenosis. Superior cerebral arteries patent bilaterally. Atherosclerotic change about both PCAs which are irregular and attenuated in appearance. Moderate to severe stenoses noted at the left P2 segment and proximal right P1/P2 segments. PCAs remain patent to their distal aspects. Appearance is similar. Venous sinuses: Patent allowing for timing the contrast bolus. Anatomic variants: As above.  No aneurysm. Review of the MIP images confirms the above findings IMPRESSION: CT HEAD: 1. No acute intracranial abnormality. 2. Chronic bilateral PCA distribution infarcts. 3. Underlying chronic microvascular ischemic disease with a few remote lacunar infarcts about the bilateral basal ganglia/corona radiata. CTA HEAD AND NECK: 1. Negative CTA for large vessel occlusion or other emergent finding. 2. Diffuse intracranial atherosclerotic disease as detailed above, overall similar as compared to prior CTA from 03/26/2022. 3. 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 Irregular intermittent opacification within the neck, but patent at the skull base, likely from collateral flow. Right vertebral artery functionally terminates in PICA. Moderate multifocal stenoses about the contralateral dominant left V4 segment. 4.  Aortic Atherosclerosis (ICD10-I70.0). Electronically Signed   By: Rise Mu M.D.   On: 04/12/2023 20:45    (Echo, Carotid, EGD, Colonoscopy, ERCP)    Subjective: Pt c/o malaise    Discharge Exam: Vitals:   04/23/23 2330 04/24/23 0820  BP: 126/78 128/87  Pulse: 79 89  Resp: 18 16  Temp: 97.7 F (36.5 C) 98.2 F (36.8 C)  SpO2: 98% 97%   Vitals:   04/23/23 0810 04/23/23 2330 04/24/23 0500 04/24/23 0820  BP: 110/67 126/78  128/87  Pulse: 85 79  89  Resp: 17 18  16   Temp: 98 F (36.7 C) 97.7 F (36.5 C)  98.2 F (36.8 C)  TempSrc:    Oral  SpO2: 97% 98%  97%  Weight:   97 kg   Height:        General: Pt is alert, awake, not in acute distress Cardiovascular: S1/S2 +, no rubs, no gallops Respiratory: decreased breath sounds b/l  Abdominal: Soft, NT, obese, bowel sounds + Extremities: no cyanosis    The results of significant diagnostics from this hospitalization (including imaging, microbiology, ancillary and laboratory) are listed below for reference.     Microbiology: Recent Results (from the past 240 hours)  Resp panel by RT-PCR (RSV, Flu A&B, Covid) Anterior Nasal Swab     Status: Abnormal   Collection Time: 04/22/23  4:18 PM   Specimen: Anterior Nasal Swab  Result Value Ref Range Status   SARS Coronavirus 2 by RT PCR NEGATIVE NEGATIVE Final    Comment: (NOTE) SARS-CoV-2 target nucleic acids are NOT DETECTED.  The SARS-CoV-2 RNA is generally detectable in upper respiratory specimens during the acute phase of infection. The lowest concentration of SARS-CoV-2 viral copies this assay can detect is 138 copies/mL. A negative result does not preclude SARS-Cov-2 infection and should not be used as the sole basis for treatment or other patient management decisions. A negative result may occur with  improper specimen collection/handling, submission of specimen other than nasopharyngeal swab, presence of viral mutation(s) within the areas targeted by this assay, and inadequate number of viral copies(<138 copies/mL). A negative result must be combined with clinical  observations, patient history, and epidemiological information. The expected result is Negative.  Fact Sheet for Patients:  BloggerCourse.com  Fact Sheet for Healthcare Providers:  SeriousBroker.it  This test is no t yet approved or cleared by the [place] FDA and  has been authorized for detection and/or diagnosis of SARS-CoV-2 by FDA under an Emergency Use Authorization (EUA). This EUA will remain  in effect (meaning this test can be used) for the duration of the COVID-19 declaration under Section 564(b)(1) of the Act, 21 U.S.C.section 360bbb-3(b)(1), unless the authorization is terminated  or revoked sooner.       Influenza A by PCR POSITIVE (A) NEGATIVE Final   Influenza B by PCR NEGATIVE NEGATIVE Final    Comment: (NOTE) The Xpert Xpress SARS-CoV-2/FLU/RSV plus assay is intended as an aid in the diagnosis of influenza from Nasopharyngeal swab specimens and should not be used as a sole basis for treatment. Nasal washings and aspirates are unacceptable for Xpert Xpress SARS-CoV-2/FLU/RSV testing.  Fact Sheet for Patients: BloggerCourse.com  Fact Sheet for Healthcare Providers: SeriousBroker.it  This test is not yet approved or cleared by the [place] FDA and has been authorized for detection and/or diagnosis of SARS-CoV-2 by FDA under an Emergency Use Authorization (EUA). This EUA will remain in effect (meaning this test can be used) for the duration of the COVID-19 declaration under Section 564(b)(1) of the Act, 21 U.S.C. section 360bbb-3(b)(1), unless the authorization is terminated or revoked.     Resp Syncytial Virus by PCR NEGATIVE NEGATIVE Final    Comment: (NOTE) Fact Sheet for Patients: BloggerCourse.com  Fact Sheet for Healthcare Providers: SeriousBroker.it  This test is not yet approved or cleared  by the [place] FDA and has been authorized for detection and/or diagnosis of SARS-CoV-2 by FDA under an Emergency Use Authorization (EUA). This EUA will remain in effect (meaning this test can be used) for the duration of the COVID-19 declaration under Section 564(b)(1) of the Act, 21 U.S.C. section 360bbb-3(b)(1), unless the authorization is terminated or revoked.  Performed at [facility], [address] [place], [place] [address]   Blood Culture (routine x 2)     Status: None (Preliminary result)   Collection Time: 04/22/23  5:02 PM   Specimen: BLOOD RIGHT HAND  Result Value Ref Range Status   Specimen Description BLOOD RIGHT HAND  Final   Special Requests   Final    BOTTLES DRAWN AEROBIC AND ANAEROBIC Blood Culture adequate volume   Culture   Final    NO GROWTH 2 DAYS Performed at [facility], [address] [place], [place] [address]    Report Status PENDING  Incomplete  Blood Culture (routine x 2)  Status: None (Preliminary result)   Collection Time: 04/23/23 12:54 AM   Specimen: BLOOD LEFT ARM  Result Value Ref Range Status   Specimen Description BLOOD LEFT ARM  Final   Special Requests   Final    BOTTLES DRAWN AEROBIC AND ANAEROBIC Blood Culture adequate volume   Culture   Final    NO GROWTH 1 DAY Performed at [facility], [address] [place], [place] [address]    Report Status PENDING  Incomplete     Labs: BNP (last 3 results) Recent Labs    04/22/23 1702  BNP 11.1   Basic Metabolic Panel: Recent Labs  Lab 04/22/23 1702 04/23/23 0347 04/24/23 0505  NA 137 138 138  K 3.6 3.2* 4.0  CL 105 105 109  CO2 19* 21* 22  GLUCOSE 69* 72 119*  BUN 19 22 17   CREATININE 1.44* 1.36* 1.08*  CALCIUM 8.6* 8.0* 8.3*   Liver Function Tests: Recent Labs  Lab 04/22/23 1702  AST 58*  ALT 42  ALKPHOS 65  BILITOT 0.9  PROT 6.8  ALBUMIN 3.7   No results for input(s): "LIPASE", "AMYLASE" in the last 168 hours. No  results for input(s): "AMMONIA" in the last 168 hours. CBC: Recent Labs  Lab 04/22/23 1702 04/23/23 0347 04/24/23 0505  WBC 5.2 3.4* 2.8*  NEUTROABS 3.8  --   --   HGB 14.4 12.5 13.5  HCT 43.1 37.3 40.0  MCV 93.7 91.2 92.6  PLT 161 144* 152   Cardiac Enzymes: No results for input(s): "CKTOTAL", "CKMB", "CKMBINDEX", "TROPONINI" in the last 168 hours. BNP: Invalid input(s): "POCBNP" CBG: Recent Labs  Lab 04/24/23 0341 04/24/23 0555 04/24/23 0817 04/24/23 1004 04/24/23 1159  GLUCAP 113* 119* 143* 159* 134*   D-Dimer No results for input(s): "DDIMER" in the last 72 hours. Hgb A1c No results for input(s): "HGBA1C" in the last 72 hours. Lipid Profile No results for input(s): "CHOL", "HDL", "LDLCALC", "TRIG", "CHOLHDL", "LDLDIRECT" in the last 72 hours. Thyroid function studies No results for input(s): "TSH", "T4TOTAL", "T3FREE", "THYROIDAB" in the last 72 hours.  Invalid input(s): "FREET3" Anemia work up No results for input(s): "VITAMINB12", "FOLATE", "FERRITIN", "TIBC", "IRON", "RETICCTPCT" in the last 72 hours. Urinalysis    Component Value Date/Time   COLORURINE YELLOW 04/13/2023 0930   APPEARANCEUR HAZY (A) 04/13/2023 0930   LABSPEC 1.042 (H) 04/13/2023 0930   PHURINE 5.0 04/13/2023 0930   GLUCOSEU NEGATIVE 04/13/2023 0930   HGBUR NEGATIVE 04/13/2023 0930   HGBUR negative 02/05/2010 1336   BILIRUBINUR NEGATIVE 04/13/2023 0930   BILIRUBINUR negative 09/19/2022 1027   BILIRUBINUR negative 08/25/2020 1350   KETONESUR NEGATIVE 04/13/2023 0930   PROTEINUR NEGATIVE 04/13/2023 0930   UROBILINOGEN 0.2 09/19/2022 1027   UROBILINOGEN 0.2 02/05/2010 1336   NITRITE NEGATIVE 04/13/2023 0930   LEUKOCYTESUR SMALL (A) 04/13/2023 0930   Sepsis Labs Recent Labs  Lab 04/22/23 1702 04/23/23 0347 04/24/23 0505  WBC 5.2 3.4* 2.8*   Microbiology Recent Results (from the past 240 hours)  Resp panel by RT-PCR (RSV, Flu A&B, Covid) Anterior Nasal Swab     Status: Abnormal    Collection Time: 04/22/23  4:18 PM   Specimen: Anterior Nasal Swab  Result Value Ref Range Status   SARS Coronavirus 2 by RT PCR NEGATIVE NEGATIVE Final    Comment: (NOTE) SARS-CoV-2 target nucleic acids are NOT DETECTED.  The SARS-CoV-2 RNA is generally detectable in upper respiratory specimens during the acute phase of infection. The lowest concentration of SARS-CoV-2 viral copies  this assay can detect is 138 copies/mL. A negative result does not preclude SARS-Cov-2 infection and should not be used as the sole basis for treatment or other patient management decisions. A negative result may occur with  improper specimen collection/handling, submission of specimen other than nasopharyngeal swab, presence of viral mutation(s) within the areas targeted by this assay, and inadequate number of viral copies(<138 copies/mL). A negative result must be combined with clinical observations, patient history, and epidemiological information. The expected result is Negative.  Fact Sheet for Patients:  BloggerCourse.com  Fact Sheet for Healthcare Providers:  SeriousBroker.it  This test is no t yet approved or cleared by the [place] FDA and  has been authorized for detection and/or diagnosis of SARS-CoV-2 by FDA under an Emergency Use Authorization (EUA). This EUA will remain  in effect (meaning this test can be used) for the duration of the COVID-19 declaration under Section 564(b)(1) of the Act, 21 U.S.C.section 360bbb-3(b)(1), unless the authorization is terminated  or revoked sooner.       Influenza A by PCR POSITIVE (A) NEGATIVE Final   Influenza B by PCR NEGATIVE NEGATIVE Final    Comment: (NOTE) The Xpert Xpress SARS-CoV-2/FLU/RSV plus assay is intended as an aid in the diagnosis of influenza from Nasopharyngeal swab specimens and should not be used as a sole basis for treatment. Nasal washings and aspirates are unacceptable  for Xpert Xpress SARS-CoV-2/FLU/RSV testing.  Fact Sheet for Patients: BloggerCourse.com  Fact Sheet for Healthcare Providers: SeriousBroker.it  This test is not yet approved or cleared by the [place] FDA and has been authorized for detection and/or diagnosis of SARS-CoV-2 by FDA under an Emergency Use Authorization (EUA). This EUA will remain in effect (meaning this test can be used) for the duration of the COVID-19 declaration under Section 564(b)(1) of the Act, 21 U.S.C. section 360bbb-3(b)(1), unless the authorization is terminated or revoked.     Resp Syncytial Virus by PCR NEGATIVE NEGATIVE Final    Comment: (NOTE) Fact Sheet for Patients: BloggerCourse.com  Fact Sheet for Healthcare Providers: SeriousBroker.it  This test is not yet approved or cleared by the [place] FDA and has been authorized for detection and/or diagnosis of SARS-CoV-2 by FDA under an Emergency Use Authorization (EUA). This EUA will remain in effect (meaning this test can be used) for the duration of the COVID-19 declaration under Section 564(b)(1) of the Act, 21 U.S.C. section 360bbb-3(b)(1), unless the authorization is terminated or revoked.  Performed at [facility], [address] [place], [place] [address]   Blood Culture (routine x 2)     Status: None (Preliminary result)   Collection Time: 04/22/23  5:02 PM   Specimen: BLOOD RIGHT HAND  Result Value Ref Range Status   Specimen Description BLOOD RIGHT HAND  Final   Special Requests   Final    BOTTLES DRAWN AEROBIC AND ANAEROBIC Blood Culture adequate volume   Culture   Final    NO GROWTH 2 DAYS Performed at [facility], [address], [place], [place] [address]    Report Status PENDING  Incomplete  Blood Culture (routine x 2)     Status: None (Preliminary result)   Collection Time: 04/23/23 12:54 AM    Specimen: BLOOD LEFT ARM  Result Value Ref Range Status   Specimen Description BLOOD LEFT ARM  Final   Special Requests   Final    BOTTLES DRAWN AEROBIC AND ANAEROBIC Blood Culture adequate volume   Culture   Final  NO GROWTH 1 DAY Performed at [facility], [address], [place], [place] [address]    Report Status PENDING  Incomplete     Time coordinating discharge: Over 30 minutes  SIGNED:   Charise Killian, MD  [facility] 04/24/2023, 12:08 PM Pager   If 7PM-7AM, please contact night-coverage [email or web address]

## 2023-04-24 NOTE — TOC Transition Note (Signed)
 Transition of Care [facility]) - Discharge Note   Patient Details  Name: Tami Jones MRN: [id number] Date of Birth: [birth year]  Transition of Care [facility]) CM/SW Contact:  Bing Quarry, RN Phone Number: 04/24/2023, 1:00 PM   Clinical Narrative:  3/9: Patient was admitted on 04/22/23 with Influenza A.  Was recently discharge from [facility] on 04/15/23, and was being followed up by [facility] TOC post discharge. Was active with [facility] home health on admission. Discharge hhealth orders in, checked and confirmed with [facility] for resumption of services. Daughter ready to transport to home per Unit RN.  PCP: Dr Tillman Abide.  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  No encounters noted for follow up with  PCP post discharge on 04/15/23 that RN CM can see in [facility] encounters before this readmission.  Recommendations for Outpatient Follow-up:  Follow up with PCP in 1-2 weeks Get CBC w/in 1-2 weeks to check WBC   Gabriel Cirri MSN RN CM  RN Case Manager [place]  Transitions of Care Direct Dial: [phone] (Weekends Only) [facility] Main Office Phone: [phone] [facility] Fax: [phone] [email or web address]     Final next level of care: Home w Home Health Services Barriers to Discharge: No Barriers Identified   Patient Goals and CMS Choice            Discharge Placement                       Discharge Plan and Services Additional resources added to the After Visit Summary for                  DME Arranged: N/A DME Agency: NA       HH Arranged: PT, OT HH Agency: [facility] (Was active with [facility] from prior discharge and at admission.) Date [facility] Agency Contacted: 04/24/23 Time HH Agency Contacted: 1300 Representative spoke with at [facility] Agency: Kandee Keen  Social Drivers of Health (SDOH) Interventions SDOH Screenings   Food Insecurity: No Food Insecurity (04/23/2023)  Housing: Low Risk  (04/23/2023)  Transportation Needs: No Transportation  Needs (04/23/2023)  Recent Concern: Transportation Needs - Unmet Transportation Needs (04/20/2023)  Utilities: Not At Risk (04/23/2023)  Recent Concern: Utilities - At Risk (04/20/2023)  Alcohol Screen: Low Risk  (05/05/2022)  Depression (PHQ2-9): Medium Risk (01/17/2023)  Financial Resource Strain: High Risk (05/05/2022)  Physical Activity: Insufficiently Active (05/05/2022)  Social Connections: Socially Isolated (04/23/2023)  Stress: Stress Concern Present (05/05/2022)  Tobacco Use: Low Risk  (04/12/2023)  Health Literacy: Adequate Health Literacy (04/20/2023)     Readmission Risk Interventions     No data to display

## 2023-04-24 NOTE — Evaluation (Signed)
 Physical Therapy Evaluation Patient Details Name: Tami Jones MRN: [id number] DOB: 12-23-1955 Today's Date: 04/24/2023  History of Present Illness  Tami Jones is a 68 y.o. female presenting with Influenza A. PMHx significant for HTN,  HLD, asthma, dCHF, stroke with right sided weakness, GERD, depression with anxiety, CKD-3A, obesity, who presents with fever, cough, weakness, diarrhea.  Clinical Impression  The pt is presenting with continued coordination and balance deficits from prior admission for CVA. At this time the pt is presenting with increased fatigue and requires education on pacing of activities to optimize safety and independence. The pt is demonstrating ability to safely transition home with family care and continuation of HHPT services.         If plan is discharge home, recommend the following: A little help with walking and/or transfers;A little help with bathing/dressing/bathroom;Assistance with cooking/housework;Assist for transportation;Help with stairs or ramp for entrance   Can travel by private vehicle        Equipment Recommendations None recommended by PT  Recommendations for Other Services       Functional Status Assessment Patient has had a recent decline in their functional status and demonstrates the ability to make significant improvements in function in a reasonable and predictable amount of time.     Precautions / Restrictions Precautions Precautions: Fall Recall of Precautions/Restrictions: Intact Restrictions Weight Bearing Restrictions Per Provider Order: No      Mobility  Bed Mobility Overal bed mobility: Modified Independent Bed Mobility: Supine to Sit, Sit to Supine     Supine to sit: Modified independent (Device/Increase time), HOB elevated Sit to supine: Modified independent (Device/Increase time), HOB elevated        Transfers Overall transfer level: Needs assistance Equipment used: Rolling walker (2 wheels) Transfers: Sit  to/from Stand Sit to Stand: Contact guard assist                Ambulation/Gait Ambulation/Gait assistance: Contact guard assist Gait Distance (Feet): 50 Feet Assistive device: Rolling walker (2 wheels)         General Gait Details: Pt requiring verbal cues for standing rest break to assist with pacing of activity.  Stairs            Wheelchair Mobility     Tilt Bed    Modified Rankin (Stroke Patients Only)       Balance Overall balance assessment: Needs assistance Sitting-balance support: Feet supported, No upper extremity supported Sitting balance-Leahy Scale: Normal     Standing balance support: During functional activity, Reliant on assistive device for balance Standing balance-Leahy Scale: Good                               Pertinent Vitals/Pain Pain Assessment Pain Assessment: No/denies pain    Home Living Family/patient expects to be discharged to:: Private residence Living Arrangements: Children Available Help at Discharge: Family;Available PRN/intermittently Type of Home: House Home Access: Stairs to enter Entrance Stairs-Rails: Left Entrance Stairs-Number of Steps: 3 from back (typical entrance) with L railing and use of SPC   Home Layout: One level Home Equipment: Grab bars - tub/shower;Grab bars - toilet;Cane - single point;Rolling Walker (2 wheels);BSC/3in1;Shower seat Additional Comments: lives with daughter, son in law, and grandchildren    Prior Function Prior Level of Function : Independent/Modified Independent             Mobility Comments: ambulates with cane, reports driving to pick up children from school,  despite also reporting decreased sensation in R LE due to peripheral neuropathy ADLs Comments: daughter provides meals and iADLs, pt independent with ADLs. She has been getting HHOT and PT recently. Pt has been using RW for mobility.     Extremity/Trunk Assessment   Upper Extremity Assessment Upper  Extremity Assessment: LUE deficits/detail LUE Deficits / Details: Prior CVA, impaired coordination LUE Coordination: decreased fine motor    Lower Extremity Assessment Lower Extremity Assessment: Generalized weakness       Communication   Communication Communication: No apparent difficulties    Cognition Arousal: Alert Behavior During Therapy: WFL for tasks assessed/performed                             Following commands: Intact       Cueing       General Comments      Exercises     Assessment/Plan    PT Assessment Patient needs continued PT services  PT Problem List Decreased strength;Decreased range of motion;Decreased activity tolerance;Decreased balance;Decreased mobility;Decreased coordination;Decreased knowledge of use of DME       PT Treatment Interventions DME instruction;Gait training;Stair training;Functional mobility training;Therapeutic activities;Therapeutic exercise;Neuromuscular re-education;Cognitive remediation;Patient/family education;Balance training    PT Goals (Current goals can be found in the Care Plan section)  Acute Rehab PT Goals Patient Stated Goal: Return home PT Goal Formulation: With patient Time For Goal Achievement: 05/01/23 Potential to Achieve Goals: Good    Frequency Min 3X/week     Co-evaluation               AM-PAC PT "6 Clicks" Mobility  Outcome Measure Help needed turning from your back to your side while in a flat bed without using bedrails?: None Help needed moving from lying on your back to sitting on the side of a flat bed without using bedrails?: None Help needed moving to and from a bed to a chair (including a wheelchair)?: A Little Help needed standing up from a chair using your arms (e.g., wheelchair or bedside chair)?: A Little Help needed to walk in hospital room?: A Little Help needed climbing 3-5 steps with a railing? : A Lot 6 Click Score: 19    End of Session Equipment Utilized  During Treatment: Gait belt Activity Tolerance: Patient tolerated treatment well Patient left: with call bell/phone within reach;in bed;with bed alarm set Nurse Communication: Mobility status PT Visit Diagnosis: Unsteadiness on feet (R26.81);Other abnormalities of gait and mobility (R26.89);Muscle weakness (generalized) (M62.81);Other symptoms and signs involving the nervous system (R29.898)    Time: 0630-1601 PT Time Calculation (min) (ACUTE ONLY): 33 min   Charges:   PT Evaluation $PT Eval Low Complexity: 1 Low PT Treatments $Gait Training: 23-37 mins PT General Charges $$ ACUTE PT VISIT: 1 Visit         10:15 AM, 04/24/23 Dee Paden A. Mordecai Maes PT, DPT Physical Therapist - [facility]   Wyatt Thorstenson A Aldeen Riga 04/24/2023, 10:14 AM

## 2023-04-25 ENCOUNTER — Telehealth: Payer: Self-pay | Admitting: Internal Medicine

## 2023-04-25 NOTE — Telephone Encounter (Signed)
Left verbal orders on verified VM 

## 2023-04-25 NOTE — Telephone Encounter (Signed)
 Copied from CRM [id number]. Topic: Clinical - Home Health Verbal Orders >> Apr 25, 2023  9:22 AM Bo Mcclintock wrote: Caller/Agency: [facility]  Callback Number: [phone] Service Requested: Physical Therapy Skilled Nursing Evaluation for disease and medication management  Frequency: 1x/week for 1 2x/week for 2 weeks 1x/week for 3 weeks  Any new concerns about the patient? PT is not currently taking lidocaine (XYLOCAINE) 5 % ointment

## 2023-04-26 ENCOUNTER — Telehealth: Payer: Self-pay

## 2023-04-26 NOTE — Transitions of Care (Post Inpatient/ED Visit) (Unsigned)
   04/26/2023  Name: Tami Jones MRN: [id number] DOB: [birth year]  Today's TOC FU Call Status: Unsuccessful Call (1st Attempt) Date: 04/26/23  Attempted to reach the patient regarding the most recent Inpatient/ED visit.  Follow Up Plan: Additional outreach attempts will be made to reach the patient to complete the Transitions of Care (Post Inpatient/ED visit) call.   Signature Karena Addison, LPN [facility] Nurse Health Advisor Direct Dial [phone]

## 2023-04-27 ENCOUNTER — Ambulatory Visit: Admitting: Family Medicine

## 2023-04-27 ENCOUNTER — Encounter: Payer: Self-pay | Admitting: Family Medicine

## 2023-04-27 ENCOUNTER — Other Ambulatory Visit: Payer: Self-pay

## 2023-04-27 VITALS — BP 132/78 | HR 82 | Temp 98.5°F | Ht 63.0 in | Wt 207.6 lb

## 2023-04-27 DIAGNOSIS — J101 Influenza due to other identified influenza virus with other respiratory manifestations: Secondary | ICD-10-CM

## 2023-04-27 DIAGNOSIS — A09 Infectious gastroenteritis and colitis, unspecified: Secondary | ICD-10-CM | POA: Diagnosis not present

## 2023-04-27 DIAGNOSIS — D72810 Lymphocytopenia: Secondary | ICD-10-CM | POA: Insufficient documentation

## 2023-04-27 DIAGNOSIS — E162 Hypoglycemia, unspecified: Secondary | ICD-10-CM

## 2023-04-27 DIAGNOSIS — E876 Hypokalemia: Secondary | ICD-10-CM | POA: Diagnosis not present

## 2023-04-27 LAB — BASIC METABOLIC PANEL
BUN: 15 mg/dL (ref 6–23)
CO2: 25 meq/L (ref 19–32)
Calcium: 9 mg/dL (ref 8.4–10.5)
Chloride: 105 meq/L (ref 96–112)
Creatinine, Ser: 1.13 mg/dL (ref 0.40–1.20)
GFR: 50.76 mL/min — ABNORMAL LOW (ref >60.00)
Glucose, Bld: 118 mg/dL — ABNORMAL HIGH (ref 70–99)
Potassium: 4.3 meq/L (ref 3.5–5.1)
Sodium: 140 meq/L (ref 135–145)

## 2023-04-27 LAB — CBC WITH DIFFERENTIAL/PLATELET
Basophils Absolute: 0 10*3/uL (ref 0.0–0.1)
Basophils Relative: 0.4 % (ref 0.0–3.0)
Eosinophils Absolute: 0 10*3/uL (ref 0.0–0.7)
Eosinophils Relative: 0.3 % (ref 0.0–5.0)
HCT: 39.6 % (ref 36.0–46.0)
Hemoglobin: 13.5 g/dL (ref 12.0–15.0)
Lymphocytes Relative: 35.8 % (ref 12.0–46.0)
Lymphs Abs: 1.6 10*3/uL (ref 0.7–4.0)
MCHC: 33.9 g/dL (ref 30.0–36.0)
MCV: 91.9 fl (ref 78.0–100.0)
Monocytes Absolute: 0.3 10*3/uL (ref 0.1–1.0)
Monocytes Relative: 7.7 % (ref 3.0–12.0)
Neutro Abs: 2.4 10*3/uL (ref 1.4–7.7)
Neutrophils Relative %: 55.8 % (ref 43.0–77.0)
Platelets: 194 10*3/uL (ref 150.0–400.0)
RBC: 4.31 Mil/uL (ref 3.87–5.11)
RDW: 13.6 % (ref 11.5–15.5)
WBC: 4.4 10*3/uL (ref 4.0–10.5)

## 2023-04-27 LAB — CULTURE, BLOOD (ROUTINE X 2)
Culture: NO GROWTH
Special Requests: ADEQUATE

## 2023-04-27 MED ORDER — PANTOPRAZOLE SODIUM 40 MG PO TBEC: 40.0000 mg | DELAYED_RELEASE_TABLET | Freq: Every day | ORAL | 2 refills | Status: AC | PRN

## 2023-04-27 NOTE — Assessment & Plan Note (Signed)
 Due for reeval.  Now improved p.o. intake although still below baseline.

## 2023-04-27 NOTE — Patient Outreach (Signed)
 Care Management  Transitions of Care Program Transitions of Care Post-discharge week 2  04/27/2023 Name: Tami Jones MRN: [id number] DOB: May 24, 1955  Subjective: Tami Jones is a 68 y.o. year old female who is a primary care patient of Karie Schwalbe, MD. The Care Management team was unable to reach the patient by phone to assess and address transitions of care needs.   Plan: Additional outreach attempts will be made to reach the patient enrolled in the [facility] Program (Post Inpatient/ED Visit).  Two attempts were made today. The Mailbox is Full and a message cannot be left  Deidre Ala, BSN, RN [place]  [facility] RN Care Manager [phone]

## 2023-04-27 NOTE — Assessment & Plan Note (Signed)
 Acute, status post Tamiflu treatment.  Symptoms gradually improving but taking quite some time.  Encouraged patient to continue rest, hydration and symptomatic care. No sign of bacterial superinfection.

## 2023-04-27 NOTE — Assessment & Plan Note (Signed)
 Acute, now resolved likely secondary to glipizide in setting of illness. She may want to discuss alternate treatments for diabetes and follow-up with PCP. Given this issue recommended follow-up earlier than planned in 2 months.  Sooner if hypoglycemia returns.

## 2023-04-27 NOTE — Assessment & Plan Note (Signed)
 Acute, likely secondary to influenza.  Reevaluate in office today.

## 2023-04-27 NOTE — Progress Notes (Signed)
 Patient ID: Tami Jones, female    DOB: 10/24/1955, 68 y.o.   MRN: [id number]  This visit was conducted in person.  BP 132/78   Pulse 82   Temp 98.5 F (36.9 C) (Oral)   Ht 5\' 3"  (1.6 m)   Wt 207 lb 9.6 oz (94.2 kg)   SpO2 97%   BMI 36.77 kg/m    CC:  Chief Complaint  Patient presents with   Hospital Followup     Patient was in the hospital for flu    Subjective:   HPI: Tami Jones is a 68 y.o. female patient of Dr. Karle Starch with history of type 2 diabetes, hypertension, chronic kidney disease past CVA, chronic asthma presenting on 04/27/2023 for Hospital Followup  (Patient was in the hospital for flu)   Reviewed recent hospital admission notes/discharge summary  Admitted April 22, 2023. Discharged April 24, 2023    Diagnosed with influenza A..  That caused cough, diarrhea and extreme weakness/fatigue. She was hypoglycemic in the ER with blood sugar of 69 down to 40 Upon admission she was febrile with a lactic acid of 1.2, stable renal function and normal oxygen saturation. Chest x-ray showed mild left basilar atelectasis versus mild infiltrate  She was treated with Tamiflu, bronchodilators and incentive spirometry.  Glipizide was likely the cause of hypoglycemia and this was held. Initial hypokalemia, hypoglycemia and thrombocytopenia resolved by discharge.  Recommendations for Outpatient Follow-up:  Follow up with PCP in 1-2 weeks Get CBC w/in 1-2 weeks to check WBC   Today she reports she continues to be fatigued.  Still some cough  and productive mucus but feeling slightly better, not worsening.  Still coughing fits. Keeping her up at night  No fever.  Back to eating  better but not usual intake and drinking ( lots of water.   Benzonatate has not helped in the past.  Has PT, OT and Skilled nursing coming to the house since CVA in 2/20225  Wt Readings from Last 3 Encounters:  04/27/23 207 lb 9.6 oz (94.2 kg)  04/24/23 213 lb 13.5 oz (97 kg)  04/13/23  213 lb (96.6 kg)    Started back on glipizide.Marland Kitchen FBS 140-155 Lab Results  Component Value Date   HGBA1C 6.8 (A) 03/24/2023     Relevant past medical, surgical, family and social history reviewed and updated as indicated. Interim medical history since our last visit reviewed. Allergies and medications reviewed and updated. Outpatient Medications Prior to Visit  Medication Sig Dispense Refill   acetaminophen (TYLENOL) 325 MG tablet Take 325 mg by mouth as needed for mild pain (pain score 1-3) or moderate pain (pain score 4-6).     ALPRAZolam (XANAX) 0.25 MG tablet Take 1 tablet (0.25 mg total) by mouth 3 (three) times daily as needed for anxiety or sleep. (Patient taking differently: Take 0.125 mg by mouth at bedtime as needed for anxiety or sleep.) 30 tablet 0   amLODipine (NORVASC) 10 MG tablet Take 1 tablet (10 mg total) by mouth daily. (Patient taking differently: Take 10 mg by mouth every evening.) 1 tablet 3   aspirin EC 81 MG tablet Take 81 mg by mouth every evening.      atorvastatin (LIPITOR) 80 MG tablet TAKE 1 TABLET BY MOUTH EVERY EVENING 90 tablet 3   Calcium Carbonate Antacid (TUMS PO) Take 2 tablets by mouth daily as needed (heartburn).     clopidogrel (PLAVIX) 75 MG tablet TAKE 1 TABLET BY MOUTH EVERY DAY  90 tablet 3   ezetimibe (ZETIA) 10 MG tablet Take 1 tablet (10 mg total) by mouth daily. 30 tablet 3   glipiZIDE (GLUCOTROL XL) 5 MG 24 hr tablet TAKE 1 TABLET (5 MG TOTAL) BY MOUTH IN THE MORNING AND AT BEDTIME 180 tablet 3   glucose blood test strip Use daily as instructed 100 each 3   guaiFENesin-dextromethorphan (ROBITUSSIN DM) 100-10 MG/5ML syrup Take 5 mLs by mouth every 4 (four) hours as needed for up to 14 days for cough. 118 mL 0   ketoconazole (NIZORAL) 2 % cream Apply 1 Application topically 2 (two) times daily as needed for irritation. (Patient taking differently: Apply 1 Application topically as needed for irritation.) 60 g 1   lidocaine (XYLOCAINE) 5 % ointment  Apply 1 Application topically as needed. 35.44 g 0   metoprolol succinate (TOPROL-XL) 25 MG 24 hr tablet Take 2 tablets (50 mg total) by mouth daily. 90 tablet 3   ondansetron (ZOFRAN-ODT) 4 MG disintegrating tablet Take 1 tablet (4 mg total) by mouth every 6 (six) hours as needed for nausea or vomiting. 20 tablet 0   oseltamivir (TAMIFLU) 30 MG capsule Take 1 capsule (30 mg total) by mouth daily. 4 capsule 0   pantoprazole (PROTONIX) 40 MG tablet Take 40 mg by mouth daily as needed (heartburn).     No facility-administered medications prior to visit.     Per HPI unless specifically indicated in ROS section below Review of Systems  Constitutional:  Negative for fatigue and fever.  HENT:  Positive for congestion.   Eyes:  Negative for pain.  Respiratory:  Positive for cough. Negative for shortness of breath.   Cardiovascular:  Negative for chest pain, palpitations and leg swelling.  Gastrointestinal:  Negative for abdominal pain.  Genitourinary:  Negative for dysuria and vaginal bleeding.  Musculoskeletal:  Negative for back pain.  Neurological:  Negative for syncope, light-headedness and headaches.  Psychiatric/Behavioral:  Negative for dysphoric mood.    Objective:  BP 132/78   Pulse 82   Temp 98.5 F (36.9 C) (Oral)   Ht 5\' 3"  (1.6 m)   Wt 207 lb 9.6 oz (94.2 kg)   SpO2 97%   BMI 36.77 kg/m   Wt Readings from Last 3 Encounters:  04/27/23 207 lb 9.6 oz (94.2 kg)  04/24/23 213 lb 13.5 oz (97 kg)  04/13/23 213 lb (96.6 kg)      Physical Exam Constitutional:      General: She is not in acute distress.    Appearance: Normal appearance. She is well-developed. She is not ill-appearing or toxic-appearing.  HENT:     Head: Normocephalic.     Right Ear: Hearing, tympanic membrane, ear canal and external ear normal. Tympanic membrane is not erythematous, retracted or bulging.     Left Ear: Hearing, tympanic membrane, ear canal and external ear normal. Tympanic membrane is not  erythematous, retracted or bulging.     Nose: No mucosal edema or rhinorrhea.     Right Sinus: No maxillary sinus tenderness or frontal sinus tenderness.     Left Sinus: No maxillary sinus tenderness or frontal sinus tenderness.     Mouth/Throat:     Pharynx: Uvula midline.  Eyes:     General: Lids are normal. Lids are everted, no foreign bodies appreciated.     Conjunctiva/sclera: Conjunctivae normal.     Pupils: Pupils are equal, round, and reactive to light.  Neck:     Thyroid: No thyroid mass or thyromegaly.  Vascular: No carotid bruit.     Trachea: Trachea normal.  Cardiovascular:     Rate and Rhythm: Normal rate and regular rhythm.     Pulses: Normal pulses.     Heart sounds: Normal heart sounds, S1 normal and S2 normal. No murmur heard.    No friction rub. No gallop.  Pulmonary:     Effort: Pulmonary effort is normal. No tachypnea or respiratory distress.     Breath sounds: Normal breath sounds. No decreased breath sounds, wheezing, rhonchi or rales.  Abdominal:     General: Bowel sounds are normal.     Palpations: Abdomen is soft.     Tenderness: There is no abdominal tenderness.  Musculoskeletal:     Cervical back: Normal range of motion and neck supple.  Skin:    General: Skin is warm and dry.     Findings: No rash.  Neurological:     Mental Status: She is alert.     Cranial Nerves: Cranial nerves 2-12 are intact.     Sensory: Sensation is intact.     Motor: Weakness present.     Gait: Gait abnormal.     Comments: Using cane  Psychiatric:        Mood and Affect: Mood is not anxious or depressed.        Speech: Speech normal.        Behavior: Behavior normal. Behavior is cooperative.        Thought Content: Thought content normal.        Judgment: Judgment normal.       Results for orders placed or performed during the hospital encounter of 04/22/23  Resp panel by RT-PCR (RSV, Flu A&B, Covid) Anterior Nasal Swab   Collection Time: 04/22/23  4:18 PM    Specimen: Anterior Nasal Swab  Result Value Ref Range   SARS Coronavirus 2 by RT PCR NEGATIVE NEGATIVE   Influenza A by PCR POSITIVE (A) NEGATIVE   Influenza B by PCR NEGATIVE NEGATIVE   Resp Syncytial Virus by PCR NEGATIVE NEGATIVE  Blood Culture (routine x 2)   Collection Time: 04/22/23  5:02 PM   Specimen: BLOOD RIGHT HAND  Result Value Ref Range   Specimen Description BLOOD RIGHT HAND    Special Requests      BOTTLES DRAWN AEROBIC AND ANAEROBIC Blood Culture adequate volume   Culture      NO GROWTH 5 DAYS Performed at [facility], [address] [place], [place] [address]    Report Status 04/27/2023 FINAL   Lactic acid, plasma   Collection Time: 04/22/23  5:02 PM  Result Value Ref Range   Lactic Acid, Venous 1.2 0.5 - 1.9 mmol/L  Comprehensive metabolic panel   Collection Time: 04/22/23  5:02 PM  Result Value Ref Range   Sodium 137 135 - 145 mmol/L   Potassium 3.6 3.5 - 5.1 mmol/L   Chloride 105 98 - 111 mmol/L   CO2 19 (L) 22 - 32 mmol/L   Glucose, Bld 69 (L) 70 - 99 mg/dL   BUN 19 8 - 23 mg/dL   Creatinine, Ser 7.82 (H) 0.44 - 1.00 mg/dL   Calcium 8.6 (L) 8.9 - 10.3 mg/dL   Total Protein 6.8 6.5 - 8.1 g/dL   Albumin 3.7 3.5 - 5.0 g/dL   AST 58 (H) 15 - 41 U/L   ALT 42 0 - 44 U/L   Alkaline Phosphatase 65 38 - 126 U/L   Total Bilirubin 0.9 0.0 - 1.2  mg/dL   GFR, Estimated 40 (L) >60 mL/min   Anion gap 13 5 - 15  CBC with Differential   Collection Time: 04/22/23  5:02 PM  Result Value Ref Range   WBC 5.2 4.0 - 10.5 K/uL   RBC 4.60 3.87 - 5.11 MIL/uL   Hemoglobin 14.4 12.0 - 15.0 g/dL   HCT 40.9 81.1 - 91.4 %   MCV 93.7 80.0 - 100.0 fL   MCH 31.3 26.0 - 34.0 pg   MCHC 33.4 30.0 - 36.0 g/dL   RDW 78.2 95.6 - 21.3 %   Platelets 161 150 - 400 K/uL   nRBC 0.0 0.0 - 0.2 %   Neutrophils Relative % 74 %   Neutro Abs 3.8 1.7 - 7.7 K/uL   Lymphocytes Relative 16 %   Lymphs Abs 0.8 0.7 - 4.0 K/uL   Monocytes Relative 10 %   Monocytes Absolute 0.5 0.1 -  1.0 K/uL   Eosinophils Relative 0 %   Eosinophils Absolute 0.0 0.0 - 0.5 K/uL   Basophils Relative 0 %   Basophils Absolute 0.0 0.0 - 0.1 K/uL   Immature Granulocytes 0 %   Abs Immature Granulocytes 0.01 0.00 - 0.07 K/uL  Protime-INR   Collection Time: 04/22/23  5:02 PM  Result Value Ref Range   Prothrombin Time 13.9 11.4 - 15.2 seconds   INR 1.1 0.8 - 1.2  APTT   Collection Time: 04/22/23  5:02 PM  Result Value Ref Range   aPTT 30 24 - 36 seconds  Brain natriuretic peptide   Collection Time: 04/22/23  5:02 PM  Result Value Ref Range   B Natriuretic Peptide 11.1 0.0 - 100.0 pg/mL  POC CBG, ED   Collection Time: 04/22/23  9:27 PM  Result Value Ref Range   Glucose-Capillary 38 (LL) 70 - 99 mg/dL   Comment 1 Notify RN   CBG monitoring, ED   Collection Time: 04/22/23  9:55 PM  Result Value Ref Range   Glucose-Capillary 40 (LL) 70 - 99 mg/dL   Comment 1 Notify RN   CBG monitoring, ED   Collection Time: 04/22/23 10:55 PM  Result Value Ref Range   Glucose-Capillary 192 (H) 70 - 99 mg/dL  Glucose, capillary   Collection Time: 04/23/23 12:47 AM  Result Value Ref Range   Glucose-Capillary 212 (H) 70 - 99 mg/dL  Blood Culture (routine x 2)   Collection Time: 04/23/23 12:54 AM   Specimen: BLOOD LEFT ARM  Result Value Ref Range   Specimen Description BLOOD LEFT ARM    Special Requests      BOTTLES DRAWN AEROBIC AND ANAEROBIC Blood Culture adequate volume   Culture      NO GROWTH 4 DAYS Performed at [facility], [address] [place], [place] [address]    Report Status PENDING   Glucose, capillary   Collection Time: 04/23/23  2:10 AM  Result Value Ref Range   Glucose-Capillary 141 (H) 70 - 99 mg/dL  Basic metabolic panel   Collection Time: 04/23/23  3:47 AM  Result Value Ref Range   Sodium 138 135 - 145 mmol/L   Potassium 3.2 (L) 3.5 - 5.1 mmol/L   Chloride 105 98 - 111 mmol/L   CO2 21 (L) 22 - 32 mmol/L   Glucose, Bld 72 70 - 99 mg/dL   BUN 22 8 - 23  mg/dL   Creatinine, Ser 8.46 (H) 0.44 - 1.00 mg/dL   Calcium 8.0 (L) 8.9 - 10.3 mg/dL   GFR,  Estimated 43 (L) >60 mL/min   Anion gap 12 5 - 15  CBC   Collection Time: 04/23/23  3:47 AM  Result Value Ref Range   WBC 3.4 (L) 4.0 - 10.5 K/uL   RBC 4.09 3.87 - 5.11 MIL/uL   Hemoglobin 12.5 12.0 - 15.0 g/dL   HCT 29.5 62.1 - 30.8 %   MCV 91.2 80.0 - 100.0 fL   MCH 30.6 26.0 - 34.0 pg   MCHC 33.5 30.0 - 36.0 g/dL   RDW 65.7 84.6 - 96.2 %   Platelets 144 (L) 150 - 400 K/uL   nRBC 0.0 0.0 - 0.2 %  Glucose, capillary   Collection Time: 04/23/23  4:06 AM  Result Value Ref Range   Glucose-Capillary 62 (L) 70 - 99 mg/dL  Glucose, capillary   Collection Time: 04/23/23  6:03 AM  Result Value Ref Range   Glucose-Capillary 96 70 - 99 mg/dL  Glucose, capillary   Collection Time: 04/23/23  8:09 AM  Result Value Ref Range   Glucose-Capillary 119 (H) 70 - 99 mg/dL  Glucose, capillary   Collection Time: 04/23/23 10:06 AM  Result Value Ref Range   Glucose-Capillary 127 (H) 70 - 99 mg/dL  Glucose, capillary   Collection Time: 04/23/23 12:21 PM  Result Value Ref Range   Glucose-Capillary 146 (H) 70 - 99 mg/dL  Glucose, capillary   Collection Time: 04/23/23  4:34 PM  Result Value Ref Range   Glucose-Capillary 131 (H) 70 - 99 mg/dL  Glucose, capillary   Collection Time: 04/23/23  8:09 PM  Result Value Ref Range   Glucose-Capillary 121 (H) 70 - 99 mg/dL   Comment 1 Notify RN   Glucose, capillary   Collection Time: 04/23/23  9:54 PM  Result Value Ref Range   Glucose-Capillary 147 (H) 70 - 99 mg/dL   Comment 1 Notify RN   Glucose, capillary   Collection Time: 04/23/23 11:35 PM  Result Value Ref Range   Glucose-Capillary 138 (H) 70 - 99 mg/dL   Comment 1 Notify RN   Glucose, capillary   Collection Time: 04/24/23  3:41 AM  Result Value Ref Range   Glucose-Capillary 113 (H) 70 - 99 mg/dL  CBC   Collection Time: 04/24/23  5:05 AM  Result Value Ref Range   WBC 2.8 (L) 4.0 - 10.5 K/uL    RBC 4.32 3.87 - 5.11 MIL/uL   Hemoglobin 13.5 12.0 - 15.0 g/dL   HCT 95.2 84.1 - 32.4 %   MCV 92.6 80.0 - 100.0 fL   MCH 31.3 26.0 - 34.0 pg   MCHC 33.8 30.0 - 36.0 g/dL   RDW 40.1 02.7 - 25.3 %   Platelets 152 150 - 400 K/uL   nRBC 0.0 0.0 - 0.2 %  Basic metabolic panel   Collection Time: 04/24/23  5:05 AM  Result Value Ref Range   Sodium 138 135 - 145 mmol/L   Potassium 4.0 3.5 - 5.1 mmol/L   Chloride 109 98 - 111 mmol/L   CO2 22 22 - 32 mmol/L   Glucose, Bld 119 (H) 70 - 99 mg/dL   BUN 17 8 - 23 mg/dL   Creatinine, Ser 6.64 (H) 0.44 - 1.00 mg/dL   Calcium 8.3 (L) 8.9 - 10.3 mg/dL   GFR, Estimated 57 (L) >60 mL/min   Anion gap 7 5 - 15  Glucose, capillary   Collection Time: 04/24/23  5:55 AM  Result Value Ref Range   Glucose-Capillary 119 (H) 70 - 99  mg/dL   Comment 1 Notify RN   Glucose, capillary   Collection Time: 04/24/23  8:17 AM  Result Value Ref Range   Glucose-Capillary 143 (H) 70 - 99 mg/dL  Glucose, capillary   Collection Time: 04/24/23 10:04 AM  Result Value Ref Range   Glucose-Capillary 159 (H) 70 - 99 mg/dL  Glucose, capillary   Collection Time: 04/24/23 11:59 AM  Result Value Ref Range   Glucose-Capillary 134 (H) 70 - 99 mg/dL    Assessment and Plan  Influenza A Assessment & Plan: Acute, status post Tamiflu treatment.  Symptoms gradually improving but taking quite some time.  Encouraged patient to continue rest, hydration and symptomatic care. No sign of bacterial superinfection.   Hypoglycemia Assessment & Plan: Acute, now resolved likely secondary to glipizide in setting of illness. She may want to discuss alternate treatments for diabetes and follow-up with PCP. Given this issue recommended follow-up earlier than planned in 2 months.  Sooner if hypoglycemia returns.   Diarrhea of infectious origin  Hypokalemia Assessment & Plan: Due for reeval.  Now improved p.o. intake although still below baseline.  Orders: -     Basic metabolic  panel  Lymphopenia Assessment & Plan: Acute, likely secondary to influenza.  Reevaluate in office today.  Orders: -     CBC with Differential/Platelet    Return in about 2 months (around 06/27/2023) for  follow up DM with POC A1C.   Kerby Nora, MD

## 2023-04-28 ENCOUNTER — Encounter: Payer: Self-pay | Admitting: Family Medicine

## 2023-04-28 ENCOUNTER — Other Ambulatory Visit: Payer: Self-pay

## 2023-04-28 DIAGNOSIS — I1310 Hypertensive heart and chronic kidney disease without heart failure, with stage 1 through stage 4 chronic kidney disease, or unspecified chronic kidney disease: Secondary | ICD-10-CM | POA: Diagnosis not present

## 2023-04-28 DIAGNOSIS — I69354 Hemiplegia and hemiparesis following cerebral infarction affecting left non-dominant side: Secondary | ICD-10-CM | POA: Diagnosis not present

## 2023-04-28 DIAGNOSIS — E1122 Type 2 diabetes mellitus with diabetic chronic kidney disease: Secondary | ICD-10-CM | POA: Diagnosis not present

## 2023-04-28 DIAGNOSIS — Z48812 Encounter for surgical aftercare following surgery on the circulatory system: Secondary | ICD-10-CM | POA: Diagnosis not present

## 2023-04-28 DIAGNOSIS — I69351 Hemiplegia and hemiparesis following cerebral infarction affecting right dominant side: Secondary | ICD-10-CM | POA: Diagnosis not present

## 2023-04-28 LAB — CULTURE, BLOOD (ROUTINE X 2)
Culture: NO GROWTH
Special Requests: ADEQUATE

## 2023-04-28 NOTE — Patient Outreach (Signed)
 Care Management  Transitions of Care Program Transitions of Care Post-discharge week 2  04/28/2023 Name: TAJAH NOGUCHI MRN: [id number] DOB: [birth year]  Subjective: Tami Jones is a 68 y.o. year old female who is a primary care patient of Karie Schwalbe, MD. The Care Management team spoke with patient by telephone to assess and address transitions of care needs.   Plan: Additional outreach attempts will be made to reach the patient enrolled in the [facility] Program (Post Inpatient/ED Visit).  Talked with the patient for less than 15 minutes. She stated she needed to get off of the phone and could CM call her back in 10 minutes. CM has called her three times and it goes straight to VM.  Deidre Ala, BSN, RN [place]  VBCI - [employer] [facility] RN Care Manager [phone]

## 2023-04-28 NOTE — Transitions of Care (Post Inpatient/ED Visit) (Signed)
   04/28/2023  Name: Tami Jones MRN: [id number] DOB: [birth year]  Today's TOC FU Call Status: Unsuccessful Call (1st Attempt) Date: 04/26/23  Attempted to reach the patient regarding the most recent Inpatient/ED visit.  Follow Up Plan: No further outreach attempts will be made at this time. We have been unable to contact the patient. Already seen in office Signature Karena Addison, LPN [facility] Nurse Health Advisor Direct Dial [phone]

## 2023-04-29 DIAGNOSIS — I69351 Hemiplegia and hemiparesis following cerebral infarction affecting right dominant side: Secondary | ICD-10-CM | POA: Diagnosis not present

## 2023-04-29 DIAGNOSIS — Z48812 Encounter for surgical aftercare following surgery on the circulatory system: Secondary | ICD-10-CM | POA: Diagnosis not present

## 2023-04-29 DIAGNOSIS — E1122 Type 2 diabetes mellitus with diabetic chronic kidney disease: Secondary | ICD-10-CM | POA: Diagnosis not present

## 2023-04-29 DIAGNOSIS — I1310 Hypertensive heart and chronic kidney disease without heart failure, with stage 1 through stage 4 chronic kidney disease, or unspecified chronic kidney disease: Secondary | ICD-10-CM | POA: Diagnosis not present

## 2023-04-29 DIAGNOSIS — I69354 Hemiplegia and hemiparesis following cerebral infarction affecting left non-dominant side: Secondary | ICD-10-CM | POA: Diagnosis not present

## 2023-04-30 DIAGNOSIS — Z48812 Encounter for surgical aftercare following surgery on the circulatory system: Secondary | ICD-10-CM | POA: Diagnosis not present

## 2023-04-30 DIAGNOSIS — I69351 Hemiplegia and hemiparesis following cerebral infarction affecting right dominant side: Secondary | ICD-10-CM | POA: Diagnosis not present

## 2023-04-30 DIAGNOSIS — I69354 Hemiplegia and hemiparesis following cerebral infarction affecting left non-dominant side: Secondary | ICD-10-CM | POA: Diagnosis not present

## 2023-04-30 DIAGNOSIS — E1122 Type 2 diabetes mellitus with diabetic chronic kidney disease: Secondary | ICD-10-CM | POA: Diagnosis not present

## 2023-04-30 DIAGNOSIS — I1310 Hypertensive heart and chronic kidney disease without heart failure, with stage 1 through stage 4 chronic kidney disease, or unspecified chronic kidney disease: Secondary | ICD-10-CM | POA: Diagnosis not present

## 2023-05-02 ENCOUNTER — Encounter: Payer: Self-pay | Admitting: *Deleted

## 2023-05-03 DIAGNOSIS — I69354 Hemiplegia and hemiparesis following cerebral infarction affecting left non-dominant side: Secondary | ICD-10-CM | POA: Diagnosis not present

## 2023-05-03 DIAGNOSIS — Z48812 Encounter for surgical aftercare following surgery on the circulatory system: Secondary | ICD-10-CM | POA: Diagnosis not present

## 2023-05-03 DIAGNOSIS — I69351 Hemiplegia and hemiparesis following cerebral infarction affecting right dominant side: Secondary | ICD-10-CM | POA: Diagnosis not present

## 2023-05-03 DIAGNOSIS — I1310 Hypertensive heart and chronic kidney disease without heart failure, with stage 1 through stage 4 chronic kidney disease, or unspecified chronic kidney disease: Secondary | ICD-10-CM | POA: Diagnosis not present

## 2023-05-03 DIAGNOSIS — E1122 Type 2 diabetes mellitus with diabetic chronic kidney disease: Secondary | ICD-10-CM | POA: Diagnosis not present

## 2023-05-04 ENCOUNTER — Ambulatory Visit: Payer: Self-pay | Admitting: Internal Medicine

## 2023-05-04 ENCOUNTER — Ambulatory Visit: Admitting: Nurse Practitioner

## 2023-05-04 NOTE — Telephone Encounter (Signed)
  Chief Complaint: confusion Symptoms: confusion, personality changes, ongoing headache Frequency: 4-6 months, worsening Pertinent Negatives: Patient denies sob, fever, chest pain, concerns for SI/HI, weakness, slurred speech Disposition: [] ED /[] Urgent Care (no appt availability in office) / [x] Appointment(In office/virtual)/ []  [facility]/ [] Home Care/ [] Refused Recommended Disposition /[] [facility]/ []  Follow-up with PCP Additional Notes: Patient's daughter called in stating that patient has been acting out of character for the last 4-6 months, worsening over the last week or two. Daughter states patient is acting more confused than normal, is hiding things, and has been expressing flat affect and abnormal behavior. Daughter reports patient has had multiple strokes in the past, daughter states most recent is 2 weeks ago, and is unsure if this is related or if mom is developing dementia. Daughter is concerned for patients safety because mom is "talking to scammers on the internet and attempting to drive when she is not suppose to". Attempted to schedule in office appt today at [facility], no availability. Patient scheduled at [facility] office today 3/19 instead. Daughter advised to call back with worsening symptoms/concerns. Daughter verbalized understanding.    Copied from CRM [id number]. Topic: Clinical - Red Word Triage >> May 04, 2023  8:19 AM Mackie Pai E wrote: [facility] that prompted transfer to Nurse Triage: Altered mental status. Patient's daughter, Judeth Cornfield, called in very worried about her mom's mental status. Judeth Cornfield is seeing signs of dementia, and her mental capacity has decreased. Patient has been hiding things (her phone), and lying to her daughter. These symptoms have been going on for the past 4-6 months. Reason for Disposition  [1] Longstanding confusion (e.g., dementia, stroke) AND [2] worsening  Answer Assessment - Initial Assessment Questions 1.  LEVEL OF CONSCIOUSNESS: "How is he (she, the patient) acting right now?" (e.g., alert-oriented, confused, lethargic, stuporous, comatose)     confused 2. ONSET: "When did the confusion start?"  (minutes, hours, days)     Worsening over 4-6 months 3. PATTERN "Does this come and go, or has it been constant since it started?"  "Is it present now?"     constant 4. ALCOHOL or DRUGS: "Has he been drinking alcohol or taking any drugs?"      none 5. NARCOTIC MEDICINES: "Has he been receiving any narcotic medications?" (e.g., morphine, Vicodin)     none 6. CAUSE: "What do you think is causing the confusion?"      Previous strokes 7. OTHER SYMPTOMS: "Are there any other symptoms?" (e.g., difficulty breathing, headache, fever, weakness)     headaches  Protocols used: Confusion - Delirium-A-AH

## 2023-05-04 NOTE — Telephone Encounter (Signed)
 Spoke with patient daughter and she decided to change appt to tomorrow with Dr. Alphonsus Sias.

## 2023-05-04 NOTE — Telephone Encounter (Signed)
 Pt's daughter called back stating she can only bring jer in today. Dr Alphonsus Sias said to let her go ahead and be seen in [place] today and try to make a follow-up with him next week. Michela Pitcher is speaking to her now.

## 2023-05-05 ENCOUNTER — Ambulatory Visit: Admitting: Internal Medicine

## 2023-05-05 ENCOUNTER — Encounter: Payer: Self-pay | Admitting: Internal Medicine

## 2023-05-05 VITALS — BP 138/84 | HR 79 | Temp 98.7°F | Ht 63.0 in | Wt 208.0 lb

## 2023-05-05 DIAGNOSIS — E1121 Type 2 diabetes mellitus with diabetic nephropathy: Secondary | ICD-10-CM

## 2023-05-05 DIAGNOSIS — Z8673 Personal history of transient ischemic attack (TIA), and cerebral infarction without residual deficits: Secondary | ICD-10-CM

## 2023-05-05 DIAGNOSIS — I672 Cerebral atherosclerosis: Secondary | ICD-10-CM | POA: Diagnosis not present

## 2023-05-05 DIAGNOSIS — F39 Unspecified mood [affective] disorder: Secondary | ICD-10-CM | POA: Diagnosis not present

## 2023-05-05 DIAGNOSIS — Z7984 Long term (current) use of oral hypoglycemic drugs: Secondary | ICD-10-CM

## 2023-05-05 MED ORDER — GLIPIZIDE ER 5 MG PO TB24: 5.0000 mg | ORAL_TABLET | Freq: Every day | ORAL | 3 refills | Status: AC

## 2023-05-05 NOTE — Assessment & Plan Note (Signed)
 Having more trouble since strokes  Socially isolated other than her family Not able to get to church Discussed looking into senior center I am not excited about medications  Will refer for counseling--daughter can do virtual through computer for her

## 2023-05-05 NOTE — Assessment & Plan Note (Signed)
 Will work on additional cholesterol meds

## 2023-05-05 NOTE — Patient Instructions (Signed)
 Please cut the glipizide to 5mg  once a day at breakfast.

## 2023-05-05 NOTE — Assessment & Plan Note (Signed)
 Lab Results  Component Value Date   HGBA1C 6.8 (A) 03/24/2023   Will cut the glipizide to 5mg  daily --due to some hypoglycemia

## 2023-05-05 NOTE — Progress Notes (Signed)
 Subjective:    Patient ID: Tami Jones, female    DOB: [birth year], 68 y.o.   MRN: [id number]  HPI Here with daughter due to concerns about her mental state  Was seen in ER for influenza and admitted Glipizide stopped due to hypoglycemia--but restarted Treated with tamiflu Here for follow up 3/12--not back to normal Still doing home rehab and weekly RN visits  No recurrence of hypoglycemia Generally 140-150's  Daughter feels her thinking is not right for some months Trouble with judgement---talking to a scammer on phone thinking it was a celebrity She is reluctant to believe daughter when she is trying to clear it up---and hasn't given up on these contacts Was buying gift cards at their urging Daughter took phone and has lock on phone  She remains depressed  Trouble coping with the strokes-angry "cause I can't do anything about them" Gets irritable easily Mostly has problems during the day when she is alone  Feels faith is important for her--but hasn't been to church lately Prays at home and watches on TV, etc Limited activities due to vision problems since stroke   Current Outpatient Medications on File Prior to Visit  Medication Sig Dispense Refill   acetaminophen (TYLENOL) 325 MG tablet Take 325 mg by mouth as needed for mild pain (pain score 1-3) or moderate pain (pain score 4-6).     ALPRAZolam (XANAX) 0.25 MG tablet Take 1 tablet (0.25 mg total) by mouth 3 (three) times daily as needed for anxiety or sleep. (Patient taking differently: Take 0.125 mg by mouth at bedtime as needed for anxiety or sleep.) 30 tablet 0   amLODipine (NORVASC) 10 MG tablet Take 1 tablet (10 mg total) by mouth daily. (Patient taking differently: Take 10 mg by mouth every evening.) 1 tablet 3   aspirin EC 81 MG tablet Take 81 mg by mouth every evening.      atorvastatin (LIPITOR) 80 MG tablet TAKE 1 TABLET BY MOUTH EVERY EVENING 90 tablet 3   Calcium Carbonate Antacid (TUMS PO) Take 2 tablets  by mouth daily as needed (heartburn).     clopidogrel (PLAVIX) 75 MG tablet TAKE 1 TABLET BY MOUTH EVERY DAY 90 tablet 3   ezetimibe (ZETIA) 10 MG tablet Take 1 tablet (10 mg total) by mouth daily. 30 tablet 3   glipiZIDE (GLUCOTROL XL) 5 MG 24 hr tablet TAKE 1 TABLET (5 MG TOTAL) BY MOUTH IN THE MORNING AND AT BEDTIME 180 tablet 3   glucose blood test strip Use daily as instructed 100 each 3   ketoconazole (NIZORAL) 2 % cream Apply 1 Application topically 2 (two) times daily as needed for irritation. (Patient taking differently: Apply 1 Application topically as needed for irritation.) 60 g 1   lidocaine (XYLOCAINE) 5 % ointment Apply 1 Application topically as needed. 35.44 g 0   metoprolol succinate (TOPROL-XL) 25 MG 24 hr tablet Take 2 tablets (50 mg total) by mouth daily. 90 tablet 3   ondansetron (ZOFRAN-ODT) 4 MG disintegrating tablet Take 1 tablet (4 mg total) by mouth every 6 (six) hours as needed for nausea or vomiting. 20 tablet 0   pantoprazole (PROTONIX) 40 MG tablet Take 1 tablet (40 mg total) by mouth daily as needed (heartburn). 90 tablet 2   No current facility-administered medications on file prior to visit.    Allergies  Allergen Reactions   Citalopram Other (See Comments)    Feels odd   Doxycycline Hyclate Nausea And Vomiting   Erythromycin Other (See  Comments)    Irritated stomach   Montelukast Sodium Itching   Nitrofurantoin Nausea And Vomiting   Sulfa Antibiotics Nausea And Vomiting   Tramadol Hcl Other (See Comments)    Feels odd   Venlafaxine Hcl Other (See Comments)    Feels odd   Amoxicillin-Pot Clavulanate Nausea And Vomiting   Clarithromycin Rash     See ER note 02/2017   Gabapentin     Makes her feel off.    Lisinopril     Makes patient feel bad, and some noted swelling in ankles but not mouth, face, tongue.     Past Medical History:  Diagnosis Date   Anxiety    Asthma    CVA (cerebral vascular accident) (HCC) 03/2006   right occipital, Dr.  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 EF 60%   CESAREAN SECTION     ESOPHAGOGASTRODUODENOSCOPY  05/2005   LOOP RECORDER INSERTION N/A 04/15/2023   Procedure: LOOP RECORDER INSERTION;  Surgeon: Regan Lemming, MD;  Location: [facility];  Service: Cardiovascular;  Laterality: N/A;   TONSILLECTOMY AND ADENOIDECTOMY      Family History  Problem Relation Age of Onset   Coronary artery disease Mother    Diabetes Mellitus II Mother    Heart attack Mother 80   Diabetes Mellitus II Maternal Grandmother     Social History   Socioeconomic History   Marital status: Divorced    Spouse name: Not on file   Number of children: 2   Years of education: Not on file   Highest education level: Not on file  Occupational History   Occupation: LAB TECH    Employer: [employer]  Tobacco Use   Smoking status: Never    Passive exposure: Never   Smokeless tobacco: Never  Vaping Use   Vaping status: Never Used  Substance and Sexual Activity   Alcohol use: No    Alcohol/week: 0.0 standard drinks of alcohol    Comment: wine occassionally   Drug use: No   Sexual activity: Yes    Birth control/protection: Condom  Other Topics Concern   Not on file  Social History Narrative   No living will   Daughter should be health care decision maker   Would accept resuscitation   No tube feeds if cognitively aware   Social Drivers of Health   Financial Resource Strain: High Risk (05/05/2022)   Overall Financial Resource Strain (CARDIA)    Difficulty of Paying Living Expenses: Hard  Food Insecurity: No Food Insecurity (04/23/2023)   Hunger Vital Sign    Worried About Running Out of Food in the Last Year: Never true    Ran Out of Food in the Last Year: Never  true  Transportation Needs: No Transportation Needs (04/23/2023)   PRAPARE - Administrator, Civil Service (Medical): No    Lack of Transportation (Non-Medical): No  Recent Concern: Transportation Needs - Unmet Transportation Needs (04/20/2023)   PRAPARE - Administrator, Civil Service (Medical): Yes    Lack of Transportation (Non-Medical): No  Physical Activity: Insufficiently Active (05/05/2022)   Exercise Vital Sign    Days of Exercise per Week: 4 days    Minutes of Exercise per Session: 20  min  Stress: Stress Concern Present (05/05/2022)   [employer] of Occupational Health - Occupational Stress Questionnaire    Feeling of Stress : To some extent  Social Connections: Socially Isolated (04/23/2023)   Social Connection and Isolation Panel [NHANES]    Frequency of Communication with Friends and Family: More than three times a week    Frequency of Social Gatherings with Friends and Family: Three times a week    Attends Religious Services: Never    Active Member of Clubs or Organizations: No    Attends Banker Meetings: Never    Marital Status: Divorced  Catering manager Violence: Not At Risk (04/23/2023)   Humiliation, Afraid, Rape, and Kick questionnaire    Fear of Current or Ex-Partner: No    Emotionally Abused: No    Physically Abused: No    Sexually Abused: No   Review of Systems Not eating well in the past couple of weeks--hasn't gotten normal taste back Had to quit gym--not able to go currently Not sleeping well--goes back a while Has loop recorder in now    Objective:   Physical Exam Psychiatric:     Comments: Appropriate understanding here and nothing to suggest dementia Tearful though            Assessment & Plan:

## 2023-05-06 ENCOUNTER — Telehealth: Payer: Self-pay

## 2023-05-06 DIAGNOSIS — I69354 Hemiplegia and hemiparesis following cerebral infarction affecting left non-dominant side: Secondary | ICD-10-CM | POA: Diagnosis not present

## 2023-05-06 DIAGNOSIS — I1310 Hypertensive heart and chronic kidney disease without heart failure, with stage 1 through stage 4 chronic kidney disease, or unspecified chronic kidney disease: Secondary | ICD-10-CM | POA: Diagnosis not present

## 2023-05-06 DIAGNOSIS — E1122 Type 2 diabetes mellitus with diabetic chronic kidney disease: Secondary | ICD-10-CM | POA: Diagnosis not present

## 2023-05-06 DIAGNOSIS — I69351 Hemiplegia and hemiparesis following cerebral infarction affecting right dominant side: Secondary | ICD-10-CM | POA: Diagnosis not present

## 2023-05-06 DIAGNOSIS — Z48812 Encounter for surgical aftercare following surgery on the circulatory system: Secondary | ICD-10-CM | POA: Diagnosis not present

## 2023-05-06 NOTE — Telephone Encounter (Signed)
 The pt had a Relay monitor and not an app. Her monitor is now working properly.

## 2023-05-11 DIAGNOSIS — N1831 Chronic kidney disease, stage 3a: Secondary | ICD-10-CM | POA: Diagnosis not present

## 2023-05-11 DIAGNOSIS — E1122 Type 2 diabetes mellitus with diabetic chronic kidney disease: Secondary | ICD-10-CM | POA: Diagnosis not present

## 2023-05-11 DIAGNOSIS — M47812 Spondylosis without myelopathy or radiculopathy, cervical region: Secondary | ICD-10-CM | POA: Diagnosis not present

## 2023-05-11 DIAGNOSIS — F419 Anxiety disorder, unspecified: Secondary | ICD-10-CM

## 2023-05-11 DIAGNOSIS — J45909 Unspecified asthma, uncomplicated: Secondary | ICD-10-CM | POA: Diagnosis not present

## 2023-05-11 DIAGNOSIS — I1310 Hypertensive heart and chronic kidney disease without heart failure, with stage 1 through stage 4 chronic kidney disease, or unspecified chronic kidney disease: Secondary | ICD-10-CM | POA: Diagnosis not present

## 2023-05-11 DIAGNOSIS — E785 Hyperlipidemia, unspecified: Secondary | ICD-10-CM | POA: Diagnosis not present

## 2023-05-11 DIAGNOSIS — M4802 Spinal stenosis, cervical region: Secondary | ICD-10-CM | POA: Diagnosis not present

## 2023-05-11 DIAGNOSIS — I69351 Hemiplegia and hemiparesis following cerebral infarction affecting right dominant side: Secondary | ICD-10-CM | POA: Diagnosis not present

## 2023-05-11 DIAGNOSIS — Z48812 Encounter for surgical aftercare following surgery on the circulatory system: Secondary | ICD-10-CM | POA: Diagnosis not present

## 2023-05-11 DIAGNOSIS — N179 Acute kidney failure, unspecified: Secondary | ICD-10-CM | POA: Diagnosis not present

## 2023-05-11 DIAGNOSIS — I69354 Hemiplegia and hemiparesis following cerebral infarction affecting left non-dominant side: Secondary | ICD-10-CM | POA: Diagnosis not present

## 2023-05-12 DIAGNOSIS — I69351 Hemiplegia and hemiparesis following cerebral infarction affecting right dominant side: Secondary | ICD-10-CM | POA: Diagnosis not present

## 2023-05-12 DIAGNOSIS — Z48812 Encounter for surgical aftercare following surgery on the circulatory system: Secondary | ICD-10-CM | POA: Diagnosis not present

## 2023-05-12 DIAGNOSIS — E1122 Type 2 diabetes mellitus with diabetic chronic kidney disease: Secondary | ICD-10-CM | POA: Diagnosis not present

## 2023-05-12 DIAGNOSIS — I69354 Hemiplegia and hemiparesis following cerebral infarction affecting left non-dominant side: Secondary | ICD-10-CM | POA: Diagnosis not present

## 2023-05-12 DIAGNOSIS — I1310 Hypertensive heart and chronic kidney disease without heart failure, with stage 1 through stage 4 chronic kidney disease, or unspecified chronic kidney disease: Secondary | ICD-10-CM | POA: Diagnosis not present

## 2023-05-17 ENCOUNTER — Other Ambulatory Visit: Payer: Self-pay | Admitting: Internal Medicine

## 2023-05-17 MED ORDER — CLOPIDOGREL BISULFATE 75 MG PO TABS: 75.0000 mg | ORAL_TABLET | Freq: Every day | ORAL | 3 refills | Status: AC

## 2023-05-17 MED ORDER — GLUCOSE BLOOD VI STRP: ORAL_STRIP | 3 refills | Status: DC

## 2023-05-17 MED ORDER — AMLODIPINE BESYLATE 10 MG PO TABS: 10.0000 mg | ORAL_TABLET | Freq: Every day | ORAL | 3 refills | Status: DC

## 2023-05-17 NOTE — Telephone Encounter (Signed)
 Copied from CRM [id number]. Topic: Clinical - Medication Refill >> May 17, 2023 12:38 PM Saverio Danker wrote: Most Recent Primary Care Visit:  Provider: Tillman Abide I  Department: [facility]  Visit Type: ACUTE  Date: 05/05/2023  Medication: clopidogrel (PLAVIX) 75 MG tablet,glucose blood test strip,ALPRAZolam (XANAX) 0.25 MG tablet,amLODipine (NORVASC) 10 MG tablet,  Has the patient contacted their pharmacy? Yes (Agent: If no, request that the patient contact the pharmacy for the refill. If patient does not wish to contact the pharmacy document the reason why and proceed with request.) (Agent: If yes, when and what did the pharmacy advise?)  Is this the correct pharmacy for this prescription? Yes If no, delete pharmacy and type the correct one.  This is the patient's preferred pharmacy:  [facility] [address] [place] - [address] [place] [address] Phone: [phone] Fax: [phone]   Has the prescription been filled recently? No  Is the patient out of the medication? No  Has the patient been seen for an appointment in the last year OR does the patient have an upcoming appointment? Yes  Can we respond through MyChart? No  Agent: Please be advised that Rx refills may take up to 3 business days. We ask that you follow-up with your pharmacy.

## 2023-05-17 NOTE — Telephone Encounter (Signed)
 Last filled 12-24-22 #30 Last OV: 05/05/23 ACUTE Next OV: 06/27/23 [facility]PE [facility]

## 2023-05-17 NOTE — Telephone Encounter (Signed)
 Last Fill: Plavix: 03/22/22 90 tabs/3 RF     Amlodipine: 03/24/23 1 tab/3 RF     Alprazolam: 12/24/22 30 tabs/0 RF     Test Strips: 06/12/22 100 ea/3 RF  Last OV: 05/05/23 ACUTE Next OV: 06/27/23 CPE  Routing to provider for review/authorization.

## 2023-05-18 DIAGNOSIS — I69354 Hemiplegia and hemiparesis following cerebral infarction affecting left non-dominant side: Secondary | ICD-10-CM | POA: Diagnosis not present

## 2023-05-18 DIAGNOSIS — I69351 Hemiplegia and hemiparesis following cerebral infarction affecting right dominant side: Secondary | ICD-10-CM | POA: Diagnosis not present

## 2023-05-18 DIAGNOSIS — Z48812 Encounter for surgical aftercare following surgery on the circulatory system: Secondary | ICD-10-CM | POA: Diagnosis not present

## 2023-05-18 DIAGNOSIS — E1122 Type 2 diabetes mellitus with diabetic chronic kidney disease: Secondary | ICD-10-CM | POA: Diagnosis not present

## 2023-05-18 DIAGNOSIS — I1310 Hypertensive heart and chronic kidney disease without heart failure, with stage 1 through stage 4 chronic kidney disease, or unspecified chronic kidney disease: Secondary | ICD-10-CM | POA: Diagnosis not present

## 2023-05-18 MED ORDER — ALPRAZOLAM 0.25 MG PO TABS
0.1250 mg | ORAL_TABLET | Freq: Every evening | ORAL | 0 refills | Status: DC | PRN
Start: 2023-05-18 — End: 2023-06-16

## 2023-05-20 ENCOUNTER — Telehealth: Payer: Self-pay

## 2023-05-20 DIAGNOSIS — E1122 Type 2 diabetes mellitus with diabetic chronic kidney disease: Secondary | ICD-10-CM | POA: Diagnosis not present

## 2023-05-20 DIAGNOSIS — Z48812 Encounter for surgical aftercare following surgery on the circulatory system: Secondary | ICD-10-CM | POA: Diagnosis not present

## 2023-05-20 DIAGNOSIS — I1310 Hypertensive heart and chronic kidney disease without heart failure, with stage 1 through stage 4 chronic kidney disease, or unspecified chronic kidney disease: Secondary | ICD-10-CM | POA: Diagnosis not present

## 2023-05-20 DIAGNOSIS — I69354 Hemiplegia and hemiparesis following cerebral infarction affecting left non-dominant side: Secondary | ICD-10-CM | POA: Diagnosis not present

## 2023-05-20 DIAGNOSIS — I69351 Hemiplegia and hemiparesis following cerebral infarction affecting right dominant side: Secondary | ICD-10-CM | POA: Diagnosis not present

## 2023-05-20 MED ORDER — BLOOD GLUCOSE MONITORING SUPPL DEVI: 1.0000 | Freq: Three times a day (TID) | 0 refills | Status: DC

## 2023-05-20 MED ORDER — LANCETS MISC. MISC: 1.0000 | Freq: Three times a day (TID) | 0 refills | Status: AC

## 2023-05-20 MED ORDER — LANCET DEVICE MISC: 1.0000 | Freq: Three times a day (TID) | 0 refills | Status: AC

## 2023-05-20 NOTE — Telephone Encounter (Signed)
 Sent rx for new meter, lancet device and lancets. Tried to call pt and there was no answer and no VM to leave a message.

## 2023-05-20 NOTE — Telephone Encounter (Signed)
 Copied from CRM [id number]. Topic: Clinical - Prescription Issue >> May 20, 2023 12:54 PM Nyra Capes wrote: Reason for CRM: Patient calling. Patient states she is out clopidogrel (PLAVIX) 75 MG tablet, lancets and meter spoke to Pharmacy on 04/02 they stated the prescription had expired and needs a new prescriptions sent in.   Patient would like to talk to someone.

## 2023-05-20 NOTE — Telephone Encounter (Signed)
 Rx for Plavix sent 05-17-23 #90/3 refills to [facility]. She needs to actually speak to someone at the pharmacy and ask them to get it on her profile.

## 2023-05-25 DIAGNOSIS — I1310 Hypertensive heart and chronic kidney disease without heart failure, with stage 1 through stage 4 chronic kidney disease, or unspecified chronic kidney disease: Secondary | ICD-10-CM | POA: Diagnosis not present

## 2023-05-25 DIAGNOSIS — Z48812 Encounter for surgical aftercare following surgery on the circulatory system: Secondary | ICD-10-CM | POA: Diagnosis not present

## 2023-05-25 DIAGNOSIS — I69351 Hemiplegia and hemiparesis following cerebral infarction affecting right dominant side: Secondary | ICD-10-CM | POA: Diagnosis not present

## 2023-05-25 DIAGNOSIS — E1122 Type 2 diabetes mellitus with diabetic chronic kidney disease: Secondary | ICD-10-CM | POA: Diagnosis not present

## 2023-05-25 DIAGNOSIS — I69354 Hemiplegia and hemiparesis following cerebral infarction affecting left non-dominant side: Secondary | ICD-10-CM | POA: Diagnosis not present

## 2023-06-03 DIAGNOSIS — E1122 Type 2 diabetes mellitus with diabetic chronic kidney disease: Secondary | ICD-10-CM | POA: Diagnosis not present

## 2023-06-03 DIAGNOSIS — I69354 Hemiplegia and hemiparesis following cerebral infarction affecting left non-dominant side: Secondary | ICD-10-CM | POA: Diagnosis not present

## 2023-06-03 DIAGNOSIS — I69351 Hemiplegia and hemiparesis following cerebral infarction affecting right dominant side: Secondary | ICD-10-CM | POA: Diagnosis not present

## 2023-06-03 DIAGNOSIS — I1310 Hypertensive heart and chronic kidney disease without heart failure, with stage 1 through stage 4 chronic kidney disease, or unspecified chronic kidney disease: Secondary | ICD-10-CM | POA: Diagnosis not present

## 2023-06-03 DIAGNOSIS — Z48812 Encounter for surgical aftercare following surgery on the circulatory system: Secondary | ICD-10-CM | POA: Diagnosis not present

## 2023-06-13 ENCOUNTER — Ambulatory Visit (INDEPENDENT_AMBULATORY_CARE_PROVIDER_SITE_OTHER)

## 2023-06-13 DIAGNOSIS — I63419 Cerebral infarction due to embolism of unspecified middle cerebral artery: Secondary | ICD-10-CM

## 2023-06-13 LAB — CUP PACEART REMOTE DEVICE CHECK
Date Time Interrogation Session: 20250427232440
Implantable Pulse Generator Implant Date: 20250228

## 2023-06-16 ENCOUNTER — Other Ambulatory Visit: Payer: Self-pay | Admitting: Internal Medicine

## 2023-06-16 NOTE — Telephone Encounter (Signed)
 Last written 05-18-23 #30 Last OV 05-05-23 Next OV 06-27-23 [facility]

## 2023-06-27 ENCOUNTER — Ambulatory Visit: Admitting: Internal Medicine

## 2023-06-27 ENCOUNTER — Ambulatory Visit (INDEPENDENT_AMBULATORY_CARE_PROVIDER_SITE_OTHER): Admitting: Internal Medicine

## 2023-06-27 ENCOUNTER — Encounter: Payer: Self-pay | Admitting: Internal Medicine

## 2023-06-27 VITALS — BP 138/88 | HR 76 | Temp 97.7°F | Ht 62.5 in | Wt 216.0 lb

## 2023-06-27 DIAGNOSIS — Z1211 Encounter for screening for malignant neoplasm of colon: Secondary | ICD-10-CM

## 2023-06-27 DIAGNOSIS — E1159 Type 2 diabetes mellitus with other circulatory complications: Secondary | ICD-10-CM

## 2023-06-27 DIAGNOSIS — I69351 Hemiplegia and hemiparesis following cerebral infarction affecting right dominant side: Secondary | ICD-10-CM

## 2023-06-27 DIAGNOSIS — N1831 Chronic kidney disease, stage 3a: Secondary | ICD-10-CM

## 2023-06-27 DIAGNOSIS — J019 Acute sinusitis, unspecified: Secondary | ICD-10-CM

## 2023-06-27 DIAGNOSIS — Z0000 Encounter for general adult medical examination without abnormal findings: Secondary | ICD-10-CM

## 2023-06-27 DIAGNOSIS — Z7984 Long term (current) use of oral hypoglycemic drugs: Secondary | ICD-10-CM | POA: Diagnosis not present

## 2023-06-27 DIAGNOSIS — I10 Essential (primary) hypertension: Secondary | ICD-10-CM | POA: Diagnosis not present

## 2023-06-27 LAB — HM DIABETES FOOT EXAM

## 2023-06-27 MED ORDER — AMOXICILLIN 500 MG PO TABS: 1000.0000 mg | ORAL_TABLET | Freq: Two times a day (BID) | ORAL | 0 refills | Status: AC

## 2023-06-27 NOTE — Assessment & Plan Note (Signed)
 On atorvastatin , zetia  an plavix 

## 2023-06-27 NOTE — Assessment & Plan Note (Signed)
 Has been borderline and stable Recent labs

## 2023-06-27 NOTE — Assessment & Plan Note (Signed)
 I have personally reviewed the Medicare Annual Wellness questionnaire and have noted 1. The patient's medical and social history 2. Their use of alcohol, tobacco or illicit drugs 3. Their current medications and supplements 4. The patient's functional ability including ADL's, fall risks, home safety risks and hearing or visual             impairment. 5. Diet and physical activities 6. Evidence for depression or mood disorders  The patients weight, height, BMI and visual acuity have been recorded in the chart I have made referrals, counseling and provided education to the patient based review of the above and I have provided the pt with a written personalized care plan for preventive services.  I have provided you with a copy of your personalized plan for preventive services. Please take the time to review along with your updated medication list.  Will do cologuard She needs to set up mammogram Td at the pharmacy--and can consider shingrix Flu/COVID in the fall

## 2023-06-27 NOTE — Assessment & Plan Note (Signed)
 BMI 38 with CVA, HTN, pain issues, etc Discussed lifestyle

## 2023-06-27 NOTE — Progress Notes (Signed)
 Subjective:    Patient ID: Tami Jones, female    DOB: 1955-07-20, 68 y.o.   MRN: [id number]  HPI Here for Medicare wellness visit and follow up of chronic health conditions Reviewed advanced directives Reviewed other doctors--[facility], Dr Mar Semen doctor Admitted to hospital in March for the flu, and in February for stroke like symptoms. Also in October for accelerated HTN.. Several other ER visits Has been going to gym--working on balance Vision is not great--cataract on right Hearing is fine No tobacco or alcohol Fell 3 times--with mild injuries Does some of her housework--dishes if she leans against counter, and some laundry (but can't carry it) No sig memory issues  Thinks she has a sinus infection Goes back a week or so--started with cold type thing Not as bad as last week--but still congestion, post nasal drip and headache  Last GFR stable at 58  Has had some lower sugars --70's-80's in AM lately (with CGM) Feels like she has to eat something Will go up occasionally over 200 after eating  Chronic weakness on right side--walks with cane Done with rehab Still has some pain in legs and arms---also along side of chest. All the time but worse at night Better if she gets up and walks around Is on the atorvastatin   No sig SOB Occasional palpitations--but is on monitor (implanted one) Some dizziness but no syncope Mild right sided edema Stopped amlodipine --made her itchy and bad  Rarely uses the xanax  for nerves Some degree of depression but not every day Not anhedonic--at least not all the time  Current Outpatient Medications on File Prior to Visit  Medication Sig Dispense Refill   acetaminophen  (TYLENOL ) 325 MG tablet Take 325 mg by mouth as needed for mild pain (pain score 1-3) or moderate pain (pain score 4-6).     ALPRAZolam  (XANAX ) 0.25 MG tablet TAKE 0.5-1 TABLETS (0.125-0.25 MG TOTAL) BY MOUTH AT BEDTIME AS NEEDED FOR ANXIETY OR SLEEP. 30  tablet 0   aspirin  EC 81 MG tablet Take 81 mg by mouth every evening.      atorvastatin  (LIPITOR ) 80 MG tablet TAKE 1 TABLET BY MOUTH EVERY EVENING 90 tablet 3   Blood Glucose Monitoring Suppl DEVI 1 each by Does not apply route in the morning, at noon, and at bedtime. May substitute to any manufacturer covered by patient's insurance. 1 each 0   Calcium  Carbonate Antacid (TUMS PO) Take 2 tablets by mouth daily as needed (heartburn).     clopidogrel  (PLAVIX ) 75 MG tablet Take 1 tablet (75 mg total) by mouth daily. 90 tablet 3   ezetimibe  (ZETIA ) 10 MG tablet Take 1 tablet (10 mg total) by mouth daily. 30 tablet 3   glipiZIDE  (GLUCOTROL  XL) 5 MG 24 hr tablet Take 1 tablet (5 mg total) by mouth daily with breakfast. 1 tablet 3   glucose blood test strip Use to check blood sugar once daily 100 each 3   ketoconazole  (NIZORAL ) 2 % cream Apply 1 Application topically 2 (two) times daily as needed for irritation. (Patient taking differently: Apply 1 Application topically as needed for irritation.) 60 g 1   lidocaine  (XYLOCAINE ) 5 % ointment Apply 1 Application topically as needed. 35.44 g 0   metoprolol  succinate (TOPROL -XL) 25 MG 24 hr tablet Take 2 tablets (50 mg total) by mouth daily. 90 tablet 3   ondansetron  (ZOFRAN -ODT) 4 MG disintegrating tablet Take 1 tablet (4 mg total) by mouth every 6 (six) hours as needed for nausea or  vomiting. 20 tablet 0   pantoprazole  (PROTONIX ) 40 MG tablet Take 1 tablet (40 mg total) by mouth daily as needed (heartburn). 90 tablet 2   No current facility-administered medications on file prior to visit.    Allergies  Allergen Reactions   Citalopram  Other (See Comments)    Feels odd   Doxycycline Hyclate Nausea And Vomiting   Erythromycin Other (See Comments)    Irritated stomach   Montelukast Sodium Itching   Nitrofurantoin Nausea And Vomiting   Sulfa Antibiotics Nausea And Vomiting   Tramadol Hcl Other (See Comments)    Feels odd   Venlafaxine  Hcl Other (See  Comments)    Feels odd   Amoxicillin -Pot Clavulanate Nausea And Vomiting   Clarithromycin  Rash     See ER note 02/2017   Gabapentin      Makes her feel off.    Lisinopril      Makes patient feel bad, and some noted swelling in ankles but not mouth, face, tongue.     Past Medical History:  Diagnosis Date   Anxiety    Asthma    CVA (cerebral vascular accident) (HCC) 03/2006   right occipital, Dr. 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 EF 60%   CESAREAN SECTION     ESOPHAGOGASTRODUODENOSCOPY  05/2005   LOOP RECORDER INSERTION N/A 04/15/2023   Procedure: LOOP RECORDER INSERTION;  Surgeon: Lei Pump, MD;  Location: [facility];  Service: Cardiovascular;  Laterality: N/A;   TONSILLECTOMY AND ADENOIDECTOMY      Family History  Problem Relation Age of Onset   Coronary artery disease Mother    Diabetes Mellitus II Mother    Heart attack Mother 65   Diabetes Mellitus II Maternal Grandmother     Social History   Socioeconomic History   Marital status: Divorced    Spouse name: Not on file   Number of children: 2   Years of education: Not on file   Highest education level: Not on file  Occupational History   Occupation: LAB TECH    Employer: [employer]  Tobacco Use   Smoking status: Never    Passive exposure: Never   Smokeless tobacco: Never  Vaping Use   Vaping status: Never Used  Substance and Sexual Activity   Alcohol use: No    Alcohol/week: 0.0 standard drinks of alcohol    Comment: wine occassionally   Drug use: No   Sexual activity: Yes    Birth control/protection: Condom  Other Topics Concern   Not on file  Social History Narrative   No living will   Daughter should be health care decision maker   Would  accept resuscitation   No tube feeds if cognitively unaware   Social Drivers of Health   Financial Resource Strain: High Risk (05/05/2022)   Overall Financial Resource Strain (CARDIA)    Difficulty of Paying Living Expenses: Hard  Food Insecurity: No Food Insecurity (04/23/2023)   Hunger Vital Sign    Worried About Running Out of Food in the Last Year: Never true    Ran Out of Food in the Last Year: Never true  Transportation Needs: No Transportation Needs (04/23/2023)   PRAPARE - Administrator, Civil Service (Medical): No  Lack of Transportation (Non-Medical): No  Recent Concern: Transportation Needs - Unmet Transportation Needs (04/20/2023)   PRAPARE - Administrator, Civil Service (Medical): Yes    Lack of Transportation (Non-Medical): No  Physical Activity: Insufficiently Active (05/05/2022)   Exercise Vital Sign    Days of Exercise per Week: 4 days    Minutes of Exercise per Session: 20 min  Stress: Stress Concern Present (05/05/2022)   [employer] of Occupational Health - Occupational Stress Questionnaire    Feeling of Stress : To some extent  Social Connections: Socially Isolated (04/23/2023)   Social Connection and Isolation Panel [NHANES]    Frequency of Communication with Friends and Family: More than three times a week    Frequency of Social Gatherings with Friends and Family: Three times a week    Attends Religious Services: Never    Active Member of Clubs or Organizations: No    Attends Banker Meetings: Never    Marital Status: Divorced  Catering manager Violence: Not At Risk (04/23/2023)   Humiliation, Afraid, Rape, and Kick questionnaire    Fear of Current or Ex-Partner: No    Emotionally Abused: No    Physically Abused: No    Sexually Abused: No   Review of Systems Appetite is not great Weight is back up a few pounds--BMI is 38 Still not sleeping well--but xanax  will help briefly Wears seat belt Has dental issues---needs  work done. Dental Works Some heartburn despite protonix --mild swallowing issues Bowels okay No suspicious skin lesions--unchanged mole on left calf    Objective:   Physical Exam Constitutional:      Appearance: Normal appearance.  HENT:     Mouth/Throat:     Pharynx: No oropharyngeal exudate or posterior oropharyngeal erythema.  Eyes:     Conjunctiva/sclera: Conjunctivae normal.     Pupils: Pupils are equal, round, and reactive to light.     Comments: Right exotropia?  Cardiovascular:     Rate and Rhythm: Normal rate and regular rhythm.     Pulses: Normal pulses.     Heart sounds: No murmur heard.    No gallop.     Comments: Feet cool but pulses okay Pulmonary:     Effort: Pulmonary effort is normal.     Breath sounds: Normal breath sounds. No wheezing or rales.  Abdominal:     Palpations: Abdomen is soft.     Tenderness: There is no abdominal tenderness.  Musculoskeletal:     Cervical back: Neck supple.     Right lower leg: No edema.     Left lower leg: No edema.  Lymphadenopathy:     Cervical: No cervical adenopathy.  Skin:    Findings: No rash.     Comments: No foot lesions  Neurological:     General: No focal deficit present.     Mental Status: She is alert and oriented to person, place, and time.     Comments: Mini-cog okay. Clock good-- recall 2/3 Normal sensation in feet Right side weakness  Psychiatric:        Mood and Affect: Mood normal.        Behavior: Behavior normal.            Assessment & Plan:

## 2023-06-27 NOTE — Addendum Note (Signed)
 Addended by: Curt Dover I on: 06/27/2023 03:34 PM   Modules accepted: Orders

## 2023-06-27 NOTE — Assessment & Plan Note (Signed)
 Will try 7 days of amoxil  1000 bid (she tolerates this)

## 2023-06-27 NOTE — Assessment & Plan Note (Signed)
 Lab Results  Component Value Date   HGBA1C 6.8 (A) 03/24/2023   Controlled on the glipizide  5mg  If more sig lows--will cut dose to 2.5mg  daily

## 2023-06-27 NOTE — Progress Notes (Signed)
 Hearing Screening - Comments:: Passed whisper test Vision Screening - Comments:: February 2025

## 2023-06-27 NOTE — Assessment & Plan Note (Signed)
 BP Readings from Last 3 Encounters:  06/27/23 138/88  05/05/23 138/84  04/27/23 132/78   Off amlodipine  due to side effects On metoprolol  50mg  daily

## 2023-06-30 ENCOUNTER — Ambulatory Visit

## 2023-07-14 ENCOUNTER — Ambulatory Visit

## 2023-07-14 DIAGNOSIS — I63419 Cerebral infarction due to embolism of unspecified middle cerebral artery: Secondary | ICD-10-CM

## 2023-07-14 LAB — CUP PACEART REMOTE DEVICE CHECK
Date Time Interrogation Session: 20250528233705
Implantable Pulse Generator Implant Date: 20250228

## 2023-07-15 ENCOUNTER — Ambulatory Visit: Payer: Self-pay | Admitting: Cardiology

## 2023-07-18 ENCOUNTER — Encounter

## 2023-08-04 NOTE — Progress Notes (Signed)
 [facility] Summary Report / Loop Recorder

## 2023-08-15 ENCOUNTER — Ambulatory Visit: Payer: Self-pay | Admitting: Cardiology

## 2023-08-15 ENCOUNTER — Ambulatory Visit

## 2023-08-15 DIAGNOSIS — I63419 Cerebral infarction due to embolism of unspecified middle cerebral artery: Secondary | ICD-10-CM

## 2023-08-15 LAB — CUP PACEART REMOTE DEVICE CHECK
Date Time Interrogation Session: 20250629234918
Implantable Pulse Generator Implant Date: 20250228

## 2023-08-16 ENCOUNTER — Ambulatory Visit
Admission: EM | Admit: 2023-08-16 | Discharge: 2023-08-16 | Disposition: A | Discharge status: Home / Self Care | Attending: Emergency Medicine | Admitting: Emergency Medicine

## 2023-08-16 ENCOUNTER — Encounter: Payer: Self-pay | Admitting: Emergency Medicine

## 2023-08-16 DIAGNOSIS — T63441A Toxic effect of venom of bees, accidental (unintentional), initial encounter: Secondary | ICD-10-CM

## 2023-08-16 DIAGNOSIS — S21231A Puncture wound without foreign body of right back wall of thorax without penetration into thoracic cavity, initial encounter: Secondary | ICD-10-CM

## 2023-08-16 MED ORDER — ACETAMINOPHEN-CODEINE 300-30 MG PO TABS: 1.0000 | ORAL_TABLET | Freq: Four times a day (QID) | ORAL | 0 refills | Status: DC | PRN

## 2023-08-16 MED ORDER — TRIAMCINOLONE ACETONIDE 0.1 % EX CREA: 1.0000 | TOPICAL_CREAM | Freq: Two times a day (BID) | CUTANEOUS | 0 refills | Status: AC

## 2023-08-16 NOTE — Discharge Instructions (Signed)
 On exam the area is reddened and a little swollen at the site of the puncture which appears to be a local reaction, no signs of infection and you have no respiratory involvement  Apply triamcinolone cream twice daily over the affected area, this is a steroid cream that helps with inflammation and pain  For severe pain you may use Tylenol  3 which has codeine , may take every 6 hours, be mindful this will make you feel drowsy  May apply ice over the affected area that 10 to 15-minute intervals for the next 24 hours then you may switch over to heat if you find it more comforting  For support apply pillows underneath the back  For any persisting or worsening symptoms please follow-up for reevaluation

## 2023-08-16 NOTE — ED Provider Notes (Signed)
 Tami Jones    CSN: [id number] Arrival date & time: 08/16/23  1713      History   Chief Complaint Chief Complaint  Patient presents with   Insect Bite    HPI Tami Jones is a 68 y.o. female.   Patient evaluation of pain and swelling to the right side of the back beginning within the last 1 to 2 hours after being stung by possible wasp.  Unwitnessed but did see insect laying on the ground near by.  Has not attempted treatment.  Denies respiratory involvement  Past Medical History:  Diagnosis Date   Anxiety    Asthma    CVA (cerebral vascular accident) (HCC) 03/2006   right occipital, Dr. 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   TIA (transient ischemic attack) 03/25/2022   Asthma, chronic, unspecified asthma severity, with acute exacerbation 03/25/2022   Morbid obesity (HCC) 12/28/2021   Seizure disorder (HCC) 12/28/2021   History of CVA with residual deficit 08/25/2019   Dyslipidemia    CKD (chronic kidney disease), stage IIIa    Neuropathic pain, arm 10/26/2018   Hemiparesis of right dominant side (HCC) 08/17/2018   Dental infection 08/07/2014   Cerebrovascular disease, arteriosclerotic, post-stroke 07/17/2014   Preventative health care 06/25/2014   Right sided sciatica 03/08/2013   MENOPAUSAL SYNDROME 09/10/2008   Mood disorder (HCC) 09/15/2007   Essential hypertension, benign 08/10/2006    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 EF 60%   CESAREAN SECTION     ESOPHAGOGASTRODUODENOSCOPY  05/2005   LOOP RECORDER INSERTION N/A 04/15/2023   Procedure: LOOP RECORDER INSERTION;  Surgeon: Inocencio Soyla Lunger, MD;  Location: [facility];  Service: Cardiovascular;  Laterality: N/A;   TONSILLECTOMY AND ADENOIDECTOMY      OB History     Gravida  2   Para  2   Term  2   Preterm  0   AB  0   Living  2      SAB  0   IAB  0   Ectopic  0   Multiple  0   Live Births               Home Medications    Prior to Admission medications   Medication Sig Start Date End Date Taking? Authorizing Provider  acetaminophen -codeine  (TYLENOL  #3) 300-30 MG tablet Take 1 tablet by mouth every 6 (six) hours as needed for moderate pain (pain score 4-6). 08/16/23  Yes Mahlik Lenn R, NP  triamcinolone cream (KENALOG) 0.1 % Apply 1 Application topically 2 (two) times daily. 08/16/23  Yes Ghada Abbett R, NP  acetaminophen  (TYLENOL ) 325 MG tablet Take 325 mg by mouth as needed for mild pain (pain score 1-3) or moderate pain (pain score  4-6).    [provider]  ALPRAZolam  (XANAX ) 0.25 MG tablet TAKE 0.5-1 TABLETS (0.125-0.25 MG TOTAL) BY MOUTH AT BEDTIME AS NEEDED FOR ANXIETY OR SLEEP. 06/16/23   Jimmy Ade I, MD  aspirin  EC 81 MG tablet Take 81 mg by mouth every evening.     [provider]  atorvastatin  (LIPITOR ) 80 MG tablet TAKE 1 TABLET BY MOUTH EVERY EVENING 06/11/22   Jimmy Ade I, MD  Blood Glucose Monitoring Suppl DEVI 1 each by Does not apply route in the morning, at noon, and at bedtime. May substitute to any manufacturer covered by patient's insurance. 05/20/23   Letvak, Richard I, MD  Calcium  Carbonate Antacid (TUMS PO) Take 2 tablets by mouth daily as  needed (heartburn).    [provider]  clopidogrel  (PLAVIX ) 75 MG tablet Take 1 tablet (75 mg total) by mouth daily. 05/17/23   Jimmy Ade FERNS, MD  ezetimibe  (ZETIA ) 10 MG tablet Take 1 tablet (10 mg total) by mouth daily. 04/16/23   Samtani, Jai-Gurmukh, MD  glipiZIDE  (GLUCOTROL  XL) 5 MG 24 hr tablet Take 1 tablet (5 mg total) by mouth daily with breakfast. 05/05/23   Jimmy Ade I, MD  glucose blood test strip Use to check blood sugar once daily 05/17/23   Jimmy Ade FERNS, MD  ketoconazole  (NIZORAL ) 2 % cream Apply 1 Application topically 2 (two) times daily as needed for irritation. Patient taking differently: Apply 1 Application topically as needed for irritation. 12/09/22   Jimmy Ade FERNS, MD  lidocaine  (XYLOCAINE ) 5 % ointment Apply 1 Application topically as needed. 10/03/22   Dicky Anes, MD  metoprolol  succinate (TOPROL -XL) 25 MG 24 hr tablet Take 2 tablets (50 mg total) by mouth daily. 04/15/23   Samtani, Jai-Gurmukh, MD  ondansetron  (ZOFRAN -ODT) 4 MG disintegrating tablet Take 1 tablet (4 mg total) by mouth every 6 (six) hours as needed for nausea or vomiting. 04/22/23   Dicky Anes, MD  pantoprazole  (PROTONIX ) 40 MG tablet Take 1 tablet (40 mg total) by mouth daily as needed (heartburn). 04/27/23   Avelina Greig BRAVO, MD    Family History Family History  Problem Relation Age of Onset   Coronary artery disease Mother    Diabetes Mellitus II Mother    Heart attack Mother 72   Diabetes Mellitus II Maternal Grandmother     Social History Social History   Tobacco Use   Smoking status: Never    Passive exposure: Never   Smokeless tobacco: Never  Vaping Use   Vaping status: Never Used  Substance Use Topics   Alcohol use: No    Alcohol/week: 0.0 standard drinks of alcohol    Comment: wine occassionally   Drug use: No     Allergies   Citalopram , Doxycycline hyclate, Erythromycin, Montelukast sodium, Nitrofurantoin, Sulfa antibiotics, Tramadol hcl, Venlafaxine  hcl,  Amoxicillin -pot clavulanate, Clarithromycin , Gabapentin , and Lisinopril    Review of Systems Review of Systems   Physical Exam Triage Vital Signs ED Triage Vitals  Encounter Vitals Group     BP 08/16/23 1756 (!) 164/85     Girls Systolic BP Percentile --      Girls Diastolic BP Percentile --      Boys Systolic BP Percentile --      Boys Diastolic BP Percentile --      Pulse Rate 08/16/23 1756 89     Resp 08/16/23 1756 20     Temp 08/16/23 1756 98.4 F (36.9 C)     Temp Source 08/16/23 1756 Oral  SpO2 08/16/23 1756 99 %     Weight --      Height --      Head Circumference --      Peak Flow --      Pain Score 08/16/23 1754 7     Pain Loc --      Pain Education --      Exclude from Growth Chart --    No data found.  Updated Vital Signs BP (!) 164/85 (BP Location: Left Arm)   Pulse 89   Temp 98.4 F (36.9 C) (Oral)   Resp 20   SpO2 99%   Visual Acuity Right Eye Distance:   Left Eye Distance:   Bilateral Distance:    Right Eye Near:   Left Eye Near:    Bilateral Near:     Physical Exam Constitutional:      Appearance: Normal appearance.   Eyes:     Extraocular Movements: Extraocular movements intact.   Pulmonary:     Effort: Pulmonary effort is normal.   Skin:    Comments: Less than 0.5 cm puncture wound present to the right thoracic region on the back with serous fluid blister lying over the opening, surrounding erythema and mild swelling, tender to palpation   Neurological:     Mental Status: She is alert and oriented to person, place, and time. Mental status is at baseline.      UC Treatments / Results  Labs (all labs ordered are listed, but only abnormal results are displayed) Labs Reviewed - No data to display  EKG   Radiology CUP PACEART REMOTE DEVICE CHECK Result Date: 08/15/2023 ILR summary report received. Battery status OK. Normal device function. No new symptom, tachy, brady, or pause episodes. No new AF episodes. Monthly summary  reports and ROV/PRN. [facility], [facility]   Procedures Procedures (including critical care time)  Medications Ordered in UC Medications - No data to display  Initial Impression / Assessment and Plan / UC Course  I have reviewed the triage vital signs and the nursing notes.  Pertinent labs & imaging results that were available during my care of the patient were reviewed by me and considered in my medical decision making (see chart for details).  Puncture wound of the right side of bed, bee sting reaction, accidental or unintentional  Appears to be localized reaction, no signs of infection, discussed this with patient, prescribed triamcinolone cream and Tylenol  3, PDMP reviewed low risk, endorses intolerance to steroids as it causes swelling and redness to the bilateral feet, unable to take NSAIDs due to use of Plavix  and history of CVA, advised to monitor and to follow-up for any concerns, recommended additional over-the-counter supportive Final Clinical Impressions(s) / UC Diagnoses   Final diagnoses:  Puncture wound of right side of back, initial encounter  Bee sting reaction, accidental or unintentional, initial encounter     Discharge Instructions      On exam the area is reddened and a little swollen at the site of the puncture which appears to be a local reaction, no signs of infection and you have no respiratory involvement  Apply triamcinolone cream twice daily over the affected area, this is a steroid cream that helps with inflammation and pain  For severe pain you may use Tylenol  3 which has codeine , may take every 6 hours, be mindful this will make you feel drowsy  May apply ice over the affected area that 10 to 15-minute intervals for the next 24 hours then you  may switch over to heat if you find it more comforting  For support apply pillows underneath the back  For any persisting or worsening symptoms please follow-up for reevaluation   ED Prescriptions     Medication Sig  Dispense Auth. Provider   triamcinolone cream (KENALOG) 0.1 % Apply 1 Application topically 2 (two) times daily. 30 g Sayra Frisby R, NP   acetaminophen -codeine  (TYLENOL  #3) 300-30 MG tablet Take 1 tablet by mouth every 6 (six) hours as needed for moderate pain (pain score 4-6). 10 tablet Tylan Kinn R, NP      I have reviewed the PDMP during this encounter.   Teresa Shelba SAUNDERS, [place] 08/16/23 [phone]

## 2023-08-16 NOTE — ED Triage Notes (Signed)
 Patient reports that she got stung by a insect today. Redness noted to site. Rates pain 7/10. Patient has used Benadryl  gel to site with no relief.

## 2023-08-22 ENCOUNTER — Encounter

## 2023-08-23 ENCOUNTER — Encounter (HOSPITAL_BASED_OUTPATIENT_CLINIC_OR_DEPARTMENT_OTHER): Payer: Self-pay

## 2023-08-24 ENCOUNTER — Institutional Professional Consult (permissible substitution) (HOSPITAL_BASED_OUTPATIENT_CLINIC_OR_DEPARTMENT_OTHER): Admitting: Internal Medicine

## 2023-08-25 ENCOUNTER — Ambulatory Visit: Payer: Self-pay

## 2023-08-25 NOTE — Telephone Encounter (Signed)
 FYI Only or Action Required?: FYI only for provider.  Patient was last seen in primary care on 06/27/2023 by Jimmy Charlie FERNS, MD.  Called Nurse Triage reporting Right foot injury.  Symptoms began several days ago.  Interventions attempted: OTC medications: Tylenol .  Symptoms are: gradually worsening.  Triage Disposition: See Physician Within 24 Hours  Patient/caregiver understands and will follow disposition?: Yes        Copied from CRM [id number]. Topic: Clinical - Red Word Triage >> Aug 25, 2023 10:57 AM Franky GRADE wrote: Red Word that prompted transfer to Nurse Triage: Patient fell about 3 days ago and hurt her right foot, swelling/pain on the big toe and looks like it's getting worse. Reason for Disposition  [1] MODERATE pain (e.g., interferes with normal activities, limping) AND [2] high-risk adult (e.g., age > 60 years, osteoporosis, chronic steroid use)  Answer Assessment - Initial Assessment Questions Patient is concerned due to being a diabetic and has open cut near nailbed. Patient is on aspirin  and plavix  Patient states her neuropathy feels worsened today   1. MECHANISM: How did the injury happen? (e.g., twisting injury, direct blow)      Patient tripped over her rug 2. ONSET: When did the injury happen? (e.g., minutes or hours ago)      3 days ago 3. LOCATION: Where is the injury located?      Right foot - primarily big toe and ankle 4. APPEARANCE of INJURY: What does the injury look like?      Foot is bruised and red, big toe and ankle are worst areas  5. WEIGHT-BEARING: Can you put weight on that foot? Can you walk (four steps or more)?       Barely able to put weight on it without pain 6. SIZE: For cuts, bruises, or swelling, ask: How large is it? (e.g., inches or centimeters;  entire joint)      Bleeding around the nail 7. PAIN: Is there pain? If Yes, ask: How bad is the pain? What does it keep you from doing? (Scale 0-10; or none, mild,  moderate, severe)     7-8 8. TETANUS: For any breaks in the skin, ask: When was your last tetanus booster?     2015 9. OTHER SYMPTOMS: Do you have any other symptoms?      Warmth, swelling, redness, bruising, open area by toenail  Protocols used: Foot Injury-A-AH

## 2023-08-25 NOTE — Telephone Encounter (Signed)
 Noted Will evaluate at [facility] tomorrwo

## 2023-08-26 ENCOUNTER — Encounter: Payer: Self-pay | Admitting: Internal Medicine

## 2023-08-26 ENCOUNTER — Telehealth: Payer: Self-pay

## 2023-08-26 ENCOUNTER — Ambulatory Visit (INDEPENDENT_AMBULATORY_CARE_PROVIDER_SITE_OTHER)
Admission: RE | Admit: 2023-08-26 | Discharge: 2023-08-26 | Disposition: A | Discharge status: Home / Self Care | Source: Ambulatory Visit | Attending: Internal Medicine | Admitting: Internal Medicine

## 2023-08-26 ENCOUNTER — Ambulatory Visit (INDEPENDENT_AMBULATORY_CARE_PROVIDER_SITE_OTHER): Admitting: Internal Medicine

## 2023-08-26 ENCOUNTER — Ambulatory Visit: Payer: Self-pay | Admitting: Internal Medicine

## 2023-08-26 VITALS — BP 138/88 | HR 72 | Temp 98.6°F | Ht 62.5 in | Wt 215.0 lb

## 2023-08-26 DIAGNOSIS — L03031 Cellulitis of right toe: Secondary | ICD-10-CM | POA: Diagnosis not present

## 2023-08-26 DIAGNOSIS — M79671 Pain in right foot: Secondary | ICD-10-CM | POA: Diagnosis not present

## 2023-08-26 MED ORDER — CEPHALEXIN 500 MG PO CAPS: 500.0000 mg | ORAL_CAPSULE | Freq: Three times a day (TID) | ORAL | 0 refills | Status: DC

## 2023-08-26 NOTE — Assessment & Plan Note (Signed)
 Warm compresses Cephalexin  500 tid x 5 days

## 2023-08-26 NOTE — Assessment & Plan Note (Addendum)
 After twist and fall Will check x-ray--x-ray negative Can use some left over tylenol  with codeine  she still has

## 2023-08-26 NOTE — Telephone Encounter (Signed)
 Copied from CRM [id number]. Topic: Clinical - Prescription Issue >> Aug 26, 2023 12:32 PM Rea C wrote: Reason for CRM: Patient received a letter in the mail from her insurance that her OneTouch diabetes supplies will no longer be covered starting August 3rd. The ones they will cover are:   Contour Plus Blue Blood Glucose Meter  Contour NEXT GEN Blood Glucose Meter Contour NEXT ONE Meter Contour NEXT EZ Meter   Contour PLUS BLOOD test strips  Contour Next test strips   Patient contact is [phone] (M).

## 2023-08-26 NOTE — Progress Notes (Signed)
 Subjective:    Patient ID: Tami Jones, female    DOB: 1955-06-27, 68 y.o.   MRN: [id number]  HPI Here due to right foot injury  Fell in house---tripped on carpet runner--3 days ago Right foot drags at times anyway (her weak leg) Bleeding at base of great toenail Needed help getting up Did ice it-and tried tylenol   Ongoing swelling Pain keeping her up some  Current Outpatient Medications on File Prior to Visit  Medication Sig Dispense Refill   acetaminophen  (TYLENOL ) 325 MG tablet Take 325 mg by mouth as needed for mild pain (pain score 1-3) or moderate pain (pain score 4-6).     acetaminophen -codeine  (TYLENOL  #3) 300-30 MG tablet Take 1 tablet by mouth every 6 (six) hours as needed for moderate pain (pain score 4-6). 10 tablet 0   ALPRAZolam  (XANAX ) 0.25 MG tablet TAKE 0.5-1 TABLETS (0.125-0.25 MG TOTAL) BY MOUTH AT BEDTIME AS NEEDED FOR ANXIETY OR SLEEP. 30 tablet 0   aspirin  EC 81 MG tablet Take 81 mg by mouth every evening.      atorvastatin  (LIPITOR ) 80 MG tablet TAKE 1 TABLET BY MOUTH EVERY EVENING 90 tablet 3   Blood Glucose Monitoring Suppl DEVI 1 each by Does not apply route in the morning, at noon, and at bedtime. May substitute to any manufacturer covered by patient's insurance. 1 each 0   Calcium  Carbonate Antacid (TUMS PO) Take 2 tablets by mouth daily as needed (heartburn).     clopidogrel  (PLAVIX ) 75 MG tablet Take 1 tablet (75 mg total) by mouth daily. 90 tablet 3   ezetimibe  (ZETIA ) 10 MG tablet Take 1 tablet (10 mg total) by mouth daily. 30 tablet 3   glipiZIDE  (GLUCOTROL  XL) 5 MG 24 hr tablet Take 1 tablet (5 mg total) by mouth daily with breakfast. 1 tablet 3   glucose blood test strip Use to check blood sugar once daily 100 each 3   ketoconazole  (NIZORAL ) 2 % cream Apply 1 Application topically 2 (two) times daily as needed for irritation. (Patient taking differently: Apply 1 Application topically as needed for irritation.) 60 g 1   lidocaine  (XYLOCAINE ) 5 %  ointment Apply 1 Application topically as needed. 35.44 g 0   metoprolol  succinate (TOPROL -XL) 25 MG 24 hr tablet Take 2 tablets (50 mg total) by mouth daily. 90 tablet 3   ondansetron  (ZOFRAN -ODT) 4 MG disintegrating tablet Take 1 tablet (4 mg total) by mouth every 6 (six) hours as needed for nausea or vomiting. 20 tablet 0   pantoprazole  (PROTONIX ) 40 MG tablet Take 1 tablet (40 mg total) by mouth daily as needed (heartburn). 90 tablet 2   triamcinolone  cream (KENALOG ) 0.1 % Apply 1 Application topically 2 (two) times daily. 30 g 0   No current facility-administered medications on file prior to visit.    Allergies  Allergen Reactions   Citalopram  Other (See Comments)    Feels odd   Doxycycline Hyclate Nausea And Vomiting   Erythromycin Other (See Comments)    Irritated stomach   Montelukast Sodium Itching   Nitrofurantoin Nausea And Vomiting   Sulfa Antibiotics Nausea And Vomiting   Tramadol Hcl Other (See Comments)    Feels odd   Venlafaxine  Hcl Other (See Comments)    Feels odd   Amoxicillin -Pot Clavulanate Nausea And Vomiting   Clarithromycin  Rash     See ER note 02/2017   Gabapentin      Makes her feel off.    Lisinopril      Makes  patient feel bad, and some noted swelling in ankles but not mouth, face, tongue.     Past Medical History:  Diagnosis Date   Anxiety    Asthma    CVA (cerebral vascular accident) (HCC) 03/2006   right occipital, Dr. Germaine   Diabetes mellitus    Expressive dysphasia 03/25/2022   GERD (gastroesophageal reflux disease)    Hyperlipidemia    Hypertension    Hypertensive urgency 03/25/2022    Past Surgical History:  Procedure Laterality Date   CARDIOVASCULAR STRESS TEST  1/15   myoview. EF 60%   CESAREAN SECTION     ESOPHAGOGASTRODUODENOSCOPY  05/2005   LOOP RECORDER INSERTION N/A 04/15/2023   Procedure: LOOP RECORDER INSERTION;  Surgeon: Inocencio Soyla Lunger, MD;  Location: [facility];  Service: Cardiovascular;  Laterality: N/A;    TONSILLECTOMY AND ADENOIDECTOMY      Family History  Problem Relation Age of Onset   Coronary artery disease Mother    Diabetes Mellitus II Mother    Heart attack Mother 23   Diabetes Mellitus II Maternal Grandmother     Social History   Socioeconomic History   Marital status: Divorced    Spouse name: Not on file   Number of children: 2   Years of education: Not on file   Highest education level: Not on file  Occupational History   Occupation: LAB TECH    Employer: [employer]  Tobacco Use   Smoking status: Never    Passive exposure: Never   Smokeless tobacco: Never  Vaping Use   Vaping status: Never Used  Substance and Sexual Activity   Alcohol use: No    Alcohol/week: 0.0 standard drinks of alcohol    Comment: wine occassionally   Drug use: No   Sexual activity: Yes    Birth control/protection: Condom  Other Topics Concern   Not on file  Social History Narrative   No living will   Daughter should be health care decision maker   Would accept resuscitation   No tube feeds if cognitively unaware   Social Drivers of Health   Financial Resource Strain: High Risk (05/05/2022)   Overall Financial Resource Strain (CARDIA)    Difficulty of Paying Living Expenses: Hard  Food Insecurity: No Food Insecurity (04/23/2023)   Hunger Vital Sign    Worried About Running Out of Food in the Last Year: Never true    Ran Out of Food in the Last Year: Never true  Transportation Needs: No Transportation Needs (04/23/2023)   PRAPARE - Administrator, Civil Service (Medical): No    Lack of Transportation (Non-Medical): No  Recent Concern: Transportation Needs - Unmet Transportation Needs (04/20/2023)   PRAPARE - Administrator, Civil Service (Medical): Yes    Lack of Transportation (Non-Medical): No  Physical Activity: Insufficiently Active (05/05/2022)   Exercise Vital Sign    Days of Exercise per Week: 4 days    Minutes of Exercise per Session: 20 min  Stress:  Stress Concern Present (05/05/2022)   [employer] of Occupational Health - Occupational Stress Questionnaire    Feeling of Stress : To some extent  Social Connections: Socially Isolated (04/23/2023)   Social Connection and Isolation Panel    Frequency of Communication with Friends and Family: More than three times a week    Frequency of Social Gatherings with Friends and Family: Three times a week    Attends Religious Services: Never    Active Member of Clubs or  Organizations: No    Attends Banker Meetings: Never    Marital Status: Divorced  Catering manager Violence: Not At Risk (04/23/2023)   Humiliation, Afraid, Rape, and Kick questionnaire    Fear of Current or Ex-Partner: No    Emotionally Abused: No    Physically Abused: No    Sexually Abused: No   Review of Systems     Objective:   Physical Exam Musculoskeletal:     Comments: Mild tenderness both sides of right ankle Some pain at proximal metatarsals and along medial great toe  Skin:    Comments: Redness at nailbed --right great toe Pus coming from this  Neurological:     Comments: Antalgic gait            Assessment & Plan:

## 2023-08-29 MED ORDER — ACCU-CHEK SMARTVIEW VI STRP: ORAL_STRIP | 4 refills | Status: DC

## 2023-08-29 MED ORDER — ACCU-CHEK GUIDE ME W/DEVICE KIT: 1.0000 | PACK | Freq: Once | 0 refills | Status: AC

## 2023-08-29 NOTE — Telephone Encounter (Signed)
New meter and strips sent to pharmacy.

## 2023-08-29 NOTE — Addendum Note (Signed)
 Addended by: KALLIE CLOTILDA SQUIBB on: 08/29/2023 10:00 AM   Modules accepted: Orders

## 2023-09-02 NOTE — Progress Notes (Signed)
 [facility] Summary Report / Loop Recorder

## 2023-09-14 ENCOUNTER — Encounter (HOSPITAL_BASED_OUTPATIENT_CLINIC_OR_DEPARTMENT_OTHER): Payer: Self-pay

## 2023-09-15 ENCOUNTER — Ambulatory Visit (INDEPENDENT_AMBULATORY_CARE_PROVIDER_SITE_OTHER)

## 2023-09-15 DIAGNOSIS — I63419 Cerebral infarction due to embolism of unspecified middle cerebral artery: Secondary | ICD-10-CM | POA: Diagnosis not present

## 2023-09-15 LAB — CUP PACEART REMOTE DEVICE CHECK
Date Time Interrogation Session: 20250730234711
Implantable Pulse Generator Implant Date: 20250228

## 2023-09-16 ENCOUNTER — Ambulatory Visit: Payer: Self-pay | Admitting: Cardiology

## 2023-09-21 ENCOUNTER — Encounter: Payer: Medicare Other | Admitting: Internal Medicine

## 2023-09-26 ENCOUNTER — Encounter

## 2023-09-27 ENCOUNTER — Ambulatory Visit: Admitting: Internal Medicine

## 2023-09-27 ENCOUNTER — Telehealth: Payer: Self-pay | Admitting: Internal Medicine

## 2023-09-27 NOTE — Telephone Encounter (Signed)
 Copied from CRM [id number]. Topic: General - Other >> Sep 27, 2023  1:51 PM Gennette ORN wrote: Reason for CRM: Patient is calling because she needs a documentation stating she is disable for jury duty. Patient was scheduled today.

## 2023-09-27 NOTE — Telephone Encounter (Signed)
 She was scheduled today but was a no show and is not rescheduled.   Will forward to Dr Jimmy.

## 2023-09-28 NOTE — Telephone Encounter (Signed)
 Tried to call pt and got a message several times that the number is not in service.

## 2023-09-30 ENCOUNTER — Telehealth: Payer: Self-pay

## 2023-09-30 MED ORDER — ACCU-CHEK GUIDE TEST VI STRP: ORAL_STRIP | 12 refills | Status: AC

## 2023-09-30 NOTE — Telephone Encounter (Signed)
 Called and spoke to pt's daughter. She gave me the home phone number, Spoke to pt. She thought her appt was the 28th. I rescheduled her for 10-06-23. Her jury date is 8-19/ She is calling to see what she needs to do.

## 2023-09-30 NOTE — Telephone Encounter (Signed)
 I was paged about an orange light flashing on [employer] monitor. I spoke with the [employer] representative who advised to restart the device as the orange light meant their was a signal/connection issue. The [employer] rep will reach out over the weekend if the issue is not resolved with turning it off/on. I reassured the patient that she had not had any abnormal rhythms on the IRL and to try to restart the monitor. I informed her if this did not work that a rep would reach out to her this weekend, however, there was no cause for concern.

## 2023-09-30 NOTE — Addendum Note (Signed)
 Addended by: KALLIE CLOTILDA SQUIBB on: 09/30/2023 09:23 AM   Modules accepted: Orders

## 2023-10-03 ENCOUNTER — Telehealth: Payer: Self-pay

## 2023-10-03 NOTE — Telephone Encounter (Unsigned)
 Copied from CRM #[id number]. Topic: General - Other >> Oct 03, 2023 10:39 AM Marissa P wrote: Reason for CRM: Patient called in regards to [facility] duty tomorrow please call patient back right away to let her know if its ready please

## 2023-10-03 NOTE — Telephone Encounter (Signed)
 Copied from CRM #[id number]. Topic: General - Other >> Oct 03, 2023 10:39 AM Marissa P wrote: Reason for CRM: Patient called in regards to [facility] duty tomorrow please call patient back right away to let her know if its ready please

## 2023-10-03 NOTE — Telephone Encounter (Signed)
 Spoke to pt. They will try to print it. Will let us  know if she can't

## 2023-10-03 NOTE — Telephone Encounter (Signed)
 09/30/23  9:15 AM Note Called and spoke to pt's daughter. She gave me the home phone number, Spoke to pt. She thought her appt was the 28th. I rescheduled her for 10-06-23. Her jury date is 8-19/ She is calling to see what she needs to do.

## 2023-10-06 ENCOUNTER — Encounter: Payer: Self-pay | Admitting: Internal Medicine

## 2023-10-06 ENCOUNTER — Ambulatory Visit (INDEPENDENT_AMBULATORY_CARE_PROVIDER_SITE_OTHER): Admitting: Internal Medicine

## 2023-10-06 ENCOUNTER — Ambulatory Visit: Payer: Self-pay | Admitting: Internal Medicine

## 2023-10-06 VITALS — BP 124/84 | HR 86 | Temp 98.1°F | Ht 63.0 in | Wt 220.0 lb

## 2023-10-06 DIAGNOSIS — Z7984 Long term (current) use of oral hypoglycemic drugs: Secondary | ICD-10-CM | POA: Diagnosis not present

## 2023-10-06 DIAGNOSIS — I69351 Hemiplegia and hemiparesis following cerebral infarction affecting right dominant side: Secondary | ICD-10-CM

## 2023-10-06 DIAGNOSIS — N1831 Chronic kidney disease, stage 3a: Secondary | ICD-10-CM

## 2023-10-06 DIAGNOSIS — I10 Essential (primary) hypertension: Secondary | ICD-10-CM

## 2023-10-06 DIAGNOSIS — E1159 Type 2 diabetes mellitus with other circulatory complications: Secondary | ICD-10-CM

## 2023-10-06 LAB — HEPATIC FUNCTION PANEL
ALT: 16 U/L (ref 0–35)
AST: 20 U/L (ref 0–37)
Albumin: 4 g/dL (ref 3.5–5.2)
Alkaline Phosphatase: 74 U/L (ref 39–117)
Bilirubin, Direct: 0.1 mg/dL (ref 0.0–0.3)
Total Bilirubin: 0.5 mg/dL (ref 0.2–1.2)
Total Protein: 6.7 g/dL (ref 6.0–8.3)

## 2023-10-06 LAB — RENAL FUNCTION PANEL
Albumin: 4 g/dL (ref 3.5–5.2)
BUN: 16 mg/dL (ref 6–23)
CO2: 28 meq/L (ref 19–32)
Calcium: 8.8 mg/dL (ref 8.4–10.5)
Chloride: 104 meq/L (ref 96–112)
Creatinine, Ser: 1.06 mg/dL (ref 0.40–1.20)
GFR: 54.63 mL/min — ABNORMAL LOW (ref >60.00)
Glucose, Bld: 101 mg/dL — ABNORMAL HIGH (ref 70–99)
Phosphorus: 3.8 mg/dL (ref 2.3–4.6)
Potassium: 4.1 meq/L (ref 3.5–5.1)
Sodium: 139 meq/L (ref 135–145)

## 2023-10-06 LAB — CBC
HCT: 40.3 % (ref 36.0–46.0)
Hemoglobin: 13.4 g/dL (ref 12.0–15.0)
MCHC: 33.3 g/dL (ref 30.0–36.0)
MCV: 91.3 fl (ref 78.0–100.0)
Platelets: 208 K/uL (ref 150.0–400.0)
RBC: 4.42 Mil/uL (ref 3.87–5.11)
RDW: 13.2 % (ref 11.5–15.5)
WBC: 5.6 K/uL (ref 4.0–10.5)

## 2023-10-06 LAB — LIPID PANEL
Cholesterol: 195 mg/dL (ref 0–200)
HDL: 44.3 mg/dL (ref >39.00)
LDL Cholesterol: 129 mg/dL — ABNORMAL HIGH (ref 0–99)
NonHDL: 150.24
Total CHOL/HDL Ratio: 4
Triglycerides: 106 mg/dL (ref 0.0–149.0)
VLDL: 21.2 mg/dL (ref 0.0–40.0)

## 2023-10-06 LAB — MICROALBUMIN / CREATININE URINE RATIO
Creatinine,U: 188.5 mg/dL
Microalb Creat Ratio: 11.9 mg/g (ref 0.0–30.0)
Microalb, Ur: 2.2 mg/dL — ABNORMAL HIGH (ref 0.0–1.9)

## 2023-10-06 LAB — HEMOGLOBIN A1C: Hgb A1c MFr Bld: 7.7 % — ABNORMAL HIGH (ref 4.6–6.5)

## 2023-10-06 NOTE — Assessment & Plan Note (Signed)
 stable

## 2023-10-06 NOTE — Progress Notes (Signed)
 Subjective:    Patient ID: Tami Jones, female    DOB: 1955/02/20, 68 y.o.   MRN: [id number]  HPI Here for follow up of diabetes and other chronic health conditions  Checking sugars twice a day Usually under 120 in AM, some higher at night Occasional mild low sugar reactions Some left arm pain and numbness in fingers--worse at night (discussed splint) No trouble in feet  Ongoing trouble with her right leg Drags it some Has fallen due to this---mostly mild weakness Trying exercises and going to gym to work on strength and balance  Having sinus pain--headaches Getting teeth fixed at dentist next week Left ear pain Tylenol  does help  GFR generally in the 50's  Current Outpatient Medications on File Prior to Visit  Medication Sig Dispense Refill   acetaminophen  (TYLENOL ) 325 MG tablet Take 325 mg by mouth as needed for mild pain (pain score 1-3) or moderate pain (pain score 4-6).     acetaminophen -codeine  (TYLENOL  #3) 300-30 MG tablet Take 1 tablet by mouth every 6 (six) hours as needed for moderate pain (pain score 4-6). 10 tablet 0   ALPRAZolam  (XANAX ) 0.25 MG tablet TAKE 0.5-1 TABLETS (0.125-0.25 MG TOTAL) BY MOUTH AT BEDTIME AS NEEDED FOR ANXIETY OR SLEEP. 30 tablet 0   aspirin  EC 81 MG tablet Take 81 mg by mouth every evening.      atorvastatin  (LIPITOR ) 80 MG tablet TAKE 1 TABLET BY MOUTH EVERY EVENING 90 tablet 3   Calcium  Carbonate Antacid (TUMS PO) Take 2 tablets by mouth daily as needed (heartburn).     clopidogrel  (PLAVIX ) 75 MG tablet Take 1 tablet (75 mg total) by mouth daily. 90 tablet 3   ezetimibe  (ZETIA ) 10 MG tablet Take 1 tablet (10 mg total) by mouth daily. 30 tablet 3   glipiZIDE  (GLUCOTROL  XL) 5 MG 24 hr tablet Take 1 tablet (5 mg total) by mouth daily with breakfast. 1 tablet 3   glucose blood (ACCU-CHEK GUIDE TEST) test strip Use to check blood sugar once daily. 100 each 12   ketoconazole  (NIZORAL ) 2 % cream Apply 1 Application topically 2 (two) times  daily as needed for irritation. (Patient taking differently: Apply 1 Application topically as needed for irritation.) 60 g 1   lidocaine  (XYLOCAINE ) 5 % ointment Apply 1 Application topically as needed. 35.44 g 0   metoprolol  succinate (TOPROL -XL) 25 MG 24 hr tablet Take 2 tablets (50 mg total) by mouth daily. 90 tablet 3   ondansetron  (ZOFRAN -ODT) 4 MG disintegrating tablet Take 1 tablet (4 mg total) by mouth every 6 (six) hours as needed for nausea or vomiting. 20 tablet 0   pantoprazole  (PROTONIX ) 40 MG tablet Take 1 tablet (40 mg total) by mouth daily as needed (heartburn). 90 tablet 2   triamcinolone  cream (KENALOG ) 0.1 % Apply 1 Application topically 2 (two) times daily. 30 g 0   No current facility-administered medications on file prior to visit.    Allergies  Allergen Reactions   Citalopram  Other (See Comments)    Feels odd   Doxycycline Hyclate Nausea And Vomiting   Erythromycin Other (See Comments)    Irritated stomach   Montelukast Sodium Itching   Nitrofurantoin Nausea And Vomiting   Sulfa Antibiotics Nausea And Vomiting   Tramadol Hcl Other (See Comments)    Feels odd   Venlafaxine  Hcl Other (See Comments)    Feels odd   Amoxicillin -Pot Clavulanate Nausea And Vomiting   Clarithromycin  Rash     See ER note  02/2017   Gabapentin      Makes her feel off.    Lisinopril      Makes patient feel bad, and some noted swelling in ankles but not mouth, face, tongue.     Past Medical History:  Diagnosis Date   Anxiety    Asthma    CVA (cerebral vascular accident) (HCC) 03/2006   right occipital, Dr. Germaine   Diabetes mellitus    Expressive dysphasia 03/25/2022   GERD (gastroesophageal reflux disease)    Hyperlipidemia    Hypertension    Hypertensive urgency 03/25/2022    Past Surgical History:  Procedure Laterality Date   CARDIOVASCULAR STRESS TEST  1/15   myoview. EF 60%   CESAREAN SECTION     ESOPHAGOGASTRODUODENOSCOPY  05/2005   LOOP RECORDER INSERTION N/A  04/15/2023   Procedure: LOOP RECORDER INSERTION;  Surgeon: Inocencio Soyla Lunger, MD;  Location: [facility];  Service: Cardiovascular;  Laterality: N/A;   TONSILLECTOMY AND ADENOIDECTOMY      Family History  Problem Relation Age of Onset   Coronary artery disease Mother    Diabetes Mellitus II Mother    Heart attack Mother 89   Diabetes Mellitus II Maternal Grandmother     Social History   Socioeconomic History   Marital status: Divorced    Spouse name: Not on file   Number of children: 2   Years of education: Not on file   Highest education level: Not on file  Occupational History   Occupation: LAB TECH    Employer: [employer]  Tobacco Use   Smoking status: Never    Passive exposure: Never   Smokeless tobacco: Never  Vaping Use   Vaping status: Never Used  Substance and Sexual Activity   Alcohol use: No    Alcohol/week: 0.0 standard drinks of alcohol    Comment: wine occassionally   Drug use: No   Sexual activity: Yes    Birth control/protection: Condom  Other Topics Concern   Not on file  Social History Narrative   No living will   Daughter should be health care decision maker   Would accept resuscitation   No tube feeds if cognitively unaware   Social Drivers of Health   Financial Resource Strain: High Risk (05/05/2022)   Overall Financial Resource Strain (CARDIA)    Difficulty of Paying Living Expenses: Hard  Food Insecurity: No Food Insecurity (04/23/2023)   Hunger Vital Sign    Worried About Running Out of Food in the Last Year: Never true    Ran Out of Food in the Last Year: Never true  Transportation Needs: No Transportation Needs (04/23/2023)   PRAPARE - Administrator, Civil Service (Medical): No    Lack of Transportation (Non-Medical): No  Recent Concern: Transportation Needs - Unmet Transportation Needs (04/20/2023)   PRAPARE - Administrator, Civil Service (Medical): Yes    Lack of Transportation (Non-Medical): No  Physical  Activity: Insufficiently Active (05/05/2022)   Exercise Vital Sign    Days of Exercise per Week: 4 days    Minutes of Exercise per Session: 20 min  Stress: Stress Concern Present (05/05/2022)   [employer] of Occupational Health - Occupational Stress Questionnaire    Feeling of Stress : To some extent  Social Connections: Socially Isolated (04/23/2023)   Social Connection and Isolation Panel    Frequency of Communication with Friends and Family: More than three times a week    Frequency of Social Gatherings with Friends  and Family: Three times a week    Attends Religious Services: Never    Active Member of Clubs or Organizations: No    Attends Banker Meetings: Never    Marital Status: Divorced  Catering manager Violence: Not At Risk (04/23/2023)   Humiliation, Afraid, Rape, and Kick questionnaire    Fear of Current or Ex-Partner: No    Emotionally Abused: No    Physically Abused: No    Sexually Abused: No   Review of Systems Still not sleeping well--takes  a long time to initiate. Discussed sleep contraction Appetite isn't big--doesn't eat much towards evening    Objective:   Physical Exam Constitutional:      Appearance: Normal appearance.  Cardiovascular:     Rate and Rhythm: Normal rate and regular rhythm.     Pulses: Normal pulses.     Heart sounds: No murmur heard.    No gallop.  Pulmonary:     Effort: Pulmonary effort is normal.     Breath sounds: Normal breath sounds. No wheezing or rales.  Musculoskeletal:     Cervical back: Neck supple.  Lymphadenopathy:     Cervical: No cervical adenopathy.  Skin:    Comments: No foot lesions  Neurological:     Mental Status: She is alert.     Comments: Slight irregular gait and mild right leg weakness            Assessment & Plan:

## 2023-10-06 NOTE — Assessment & Plan Note (Signed)
 BP Readings from Last 3 Encounters:  10/06/23 124/84  08/26/23 138/88  08/16/23 (!) 164/85   Controlled on metoprolol  50

## 2023-10-06 NOTE — Assessment & Plan Note (Signed)
 Stable On ASA and plavix  Atorvastatin  80 Discussed needing a walking stick for balance

## 2023-10-06 NOTE — Assessment & Plan Note (Signed)
 Seems to still be well controlled with glipizide  5 daily Also has mild nephropathy

## 2023-10-08 ENCOUNTER — Other Ambulatory Visit: Payer: Self-pay | Admitting: Internal Medicine

## 2023-10-10 ENCOUNTER — Other Ambulatory Visit: Payer: Self-pay | Admitting: Internal Medicine

## 2023-10-10 NOTE — Telephone Encounter (Signed)
 Copied from CRM #[id number]. Topic: Clinical - Prescription Issue >> Oct 10, 2023  9:50 AM Robinson H wrote: Reason for CRM: Patient is calling regarding the ezetimibe  (ZETIA ) 10 MG tablet, states she doesn't have any more refills looks like pharmacy has sent over a request to Dr. Jimmy for refills but original prescription may have been written by an emergency room doctor. Patient states she's confused and not even sure if she should be still taking the medication states she had 2 bottles of the medicine but she's out.  Akemi [phone]

## 2023-10-17 ENCOUNTER — Ambulatory Visit (INDEPENDENT_AMBULATORY_CARE_PROVIDER_SITE_OTHER)

## 2023-10-17 DIAGNOSIS — I63419 Cerebral infarction due to embolism of unspecified middle cerebral artery: Secondary | ICD-10-CM

## 2023-10-19 LAB — CUP PACEART REMOTE DEVICE CHECK
Date Time Interrogation Session: 20250830232732
Implantable Pulse Generator Implant Date: 20250228

## 2023-10-23 ENCOUNTER — Ambulatory Visit: Payer: Self-pay | Admitting: Cardiology

## 2023-10-25 NOTE — Progress Notes (Signed)
 Remote Loop Recorder Transmission

## 2023-10-31 ENCOUNTER — Encounter

## 2023-11-16 NOTE — Progress Notes (Signed)
 Remote Loop Recorder Transmission

## 2023-11-17 ENCOUNTER — Ambulatory Visit: Payer: Self-pay | Admitting: Cardiology

## 2023-11-17 ENCOUNTER — Ambulatory Visit

## 2023-11-17 DIAGNOSIS — I63419 Cerebral infarction due to embolism of unspecified middle cerebral artery: Secondary | ICD-10-CM

## 2023-11-17 LAB — CUP PACEART REMOTE DEVICE CHECK
Date Time Interrogation Session: 20251001233533
Implantable Pulse Generator Implant Date: 20250228

## 2023-11-18 ENCOUNTER — Telehealth: Payer: Self-pay

## 2023-11-18 MED ORDER — ACCU-CHEK SOFTCLIX LANCETS MISC: 12 refills | Status: AC

## 2023-11-18 NOTE — Telephone Encounter (Signed)
 Rx sent electronically.

## 2023-11-21 NOTE — Progress Notes (Signed)
 Remote Loop Recorder Transmission

## 2023-12-05 ENCOUNTER — Encounter

## 2023-12-19 ENCOUNTER — Ambulatory Visit

## 2023-12-19 DIAGNOSIS — I63419 Cerebral infarction due to embolism of unspecified middle cerebral artery: Secondary | ICD-10-CM | POA: Diagnosis not present

## 2023-12-19 LAB — CUP PACEART REMOTE DEVICE CHECK
Date Time Interrogation Session: 20251102235337
Implantable Pulse Generator Implant Date: 20250228

## 2023-12-20 ENCOUNTER — Ambulatory Visit: Payer: Self-pay | Admitting: Cardiology

## 2023-12-23 NOTE — Progress Notes (Signed)
 Remote Loop Recorder Transmission

## 2024-01-06 ENCOUNTER — Other Ambulatory Visit: Payer: Self-pay

## 2024-01-06 ENCOUNTER — Other Ambulatory Visit (HOSPITAL_COMMUNITY): Payer: Self-pay

## 2024-01-06 ENCOUNTER — Telehealth: Payer: Self-pay

## 2024-01-06 ENCOUNTER — Ambulatory Visit: Payer: Self-pay

## 2024-01-06 ENCOUNTER — Ambulatory Visit (INDEPENDENT_AMBULATORY_CARE_PROVIDER_SITE_OTHER)

## 2024-01-06 VITALS — BP 138/88 | HR 66 | Temp 97.8°F | Ht 63.0 in | Wt 215.0 lb

## 2024-01-06 DIAGNOSIS — E1159 Type 2 diabetes mellitus with other circulatory complications: Secondary | ICD-10-CM

## 2024-01-06 DIAGNOSIS — G40909 Epilepsy, unspecified, not intractable, without status epilepticus: Secondary | ICD-10-CM

## 2024-01-06 DIAGNOSIS — I672 Cerebral atherosclerosis: Secondary | ICD-10-CM

## 2024-01-06 DIAGNOSIS — I10 Essential (primary) hypertension: Secondary | ICD-10-CM

## 2024-01-06 DIAGNOSIS — F339 Major depressive disorder, recurrent, unspecified: Secondary | ICD-10-CM | POA: Insufficient documentation

## 2024-01-06 DIAGNOSIS — Z8673 Personal history of transient ischemic attack (TIA), and cerebral infarction without residual deficits: Secondary | ICD-10-CM

## 2024-01-06 DIAGNOSIS — Z9189 Other specified personal risk factors, not elsewhere classified: Secondary | ICD-10-CM | POA: Diagnosis not present

## 2024-01-06 DIAGNOSIS — F419 Anxiety disorder, unspecified: Secondary | ICD-10-CM | POA: Diagnosis not present

## 2024-01-06 MED ORDER — HYDROXYZINE PAMOATE 25 MG PO CAPS: 25.0000 mg | ORAL_CAPSULE | Freq: Three times a day (TID) | ORAL | 0 refills | Status: AC | PRN

## 2024-01-06 MED ORDER — LOSARTAN POTASSIUM 25 MG PO TABS: 25.0000 mg | ORAL_TABLET | Freq: Every day | ORAL | 0 refills | Status: AC

## 2024-01-06 MED ORDER — METOPROLOL SUCCINATE ER 50 MG PO TB24: 50.0000 mg | ORAL_TABLET | Freq: Every day | ORAL | Status: AC

## 2024-01-06 MED ORDER — ZEPBOUND 2.5 MG/0.5ML ~~LOC~~ SOAJ: 2.5000 mg | SUBCUTANEOUS | 0 refills | Status: DC

## 2024-01-06 NOTE — Telephone Encounter (Signed)
 Ordered Ozempic as alternative, pls advise on coverage/need for PA

## 2024-01-06 NOTE — Telephone Encounter (Signed)
 Pharmacy Patient Advocate Encounter   Received notification from RX Request Messages that prior authorization for Zepbound  2.5 is required/requested.   Insurance verification completed.   The patient is insured through [facility].   Per test claim: Per test claim, medication is not covered due to plan/benefit exclusion, PA not submitted at this time

## 2024-01-06 NOTE — Telephone Encounter (Signed)
 Per Dr. Bennett (I spoke with her) - pt is to take 1/2 of whatever dose she has a home and it's okay to take Losartan  with metoprolol .   Spoke with pt and she states that she has metoprolol  25mg  at home. She is aware to take 1/2 of this daily for the next week along with losartan .

## 2024-01-06 NOTE — Patient Instructions (Addendum)
 Thank you for visiting [facility] today! Here's what we talked about:  - START Atarax  for anxiety as needed.  - STOP glipizide   - START Losartan  tomorrow 1 tablet daily, check BP daily and bring to next visit  - Buy pillcutter, for Metoprolol : From tomorrow take Half a tablet every day for 1 week then STOP - Call Neruology if you do not hear from them in 2 weeks

## 2024-01-06 NOTE — Progress Notes (Signed)
 Subjective:   This visit was conducted in person. The patient gave informed consent to the use of Abridge AI technology to record the contents of the encounter as documented below.   Patient ID: Tami Jones, female    DOB: [birth year], 68 y.o.   MRN: [id number]   Discussed the use of AI scribe software for clinical note transcription with the patient, who gave verbal consent to proceed.  History of Present Illness Tami Jones is a 68 year old female with a history of multiple strokes who presents for management of her chronic conditions.  She has a history of multiple strokes, including two transient ischemic attacks in February. She sometimes uses a cane for mobility and experiences fatigue, especially when shopping, requiring the use of a motorized cart. No falls in the past three months. She experiences weakness on her right side and occasional tremors in her hands and head. She attends the gym three times a week to help with balance and mobility.  She has high blood pressure and is currently taking metoprolol  50 mg daily. She also has a loop recorder implanted to monitor her heart rhythm. She reports that her blood pressure can be high during anxiety attacks at night, which wake her up.  She has diabetes and is currently on glipizide  5 mg daily. She reports fluctuating blood sugar levels, with a recent low of 80 and a high of 171. She is not on metformin  as it was previously discontinued. She is concerned about her blood sugar levels dropping too low  She has a history of acid reflux, for which she takes Protonix . She reports persistent stomach pain and discomfort, which she attributes to her medications. She also experiences itching, particularly at night.  She has a history of anxiety and depression, feeling better than before but still experiencing low mood due to staying home often. She takes Xanax  as needed for anxiety, having used it once in the past three months for a severe  anxiety attack at night.  She is awaiting further dental procedures for a partial.  She is on several medications for her conditions, including Plavix  75 mg daily, atorvastatin  80 mg, Zetia  10 mg, aspirin , and Tums as needed. She reports taking Xanax  0.25 mg as needed for anxiety, usually half a tablet.  She reports losing 72 pounds over time but struggles to lose more weight, particularly around her midsection. She engages in physical activities like biking to aid weight loss.      Review of Systems  All other systems reviewed and are negative.       Allergies  Allergen Reactions   Citalopram  Other (See Comments)    Feels odd   Doxycycline Hyclate Nausea And Vomiting   Erythromycin Other (See Comments)    Irritated stomach   Montelukast Sodium Itching   Nitrofurantoin Nausea And Vomiting   Sulfa Antibiotics Nausea And Vomiting   Tramadol Hcl Other (See Comments)    Feels odd   Venlafaxine  Hcl Other (See Comments)    Feels odd   Amoxicillin -Pot Clavulanate Nausea And Vomiting   Clarithromycin  Rash     See ER note 02/2017   Gabapentin      Makes her feel off.    Lisinopril      Makes patient feel bad, and some noted swelling in ankles but not mouth, face, tongue.     Current Outpatient Medications on File Prior to Visit  Medication Sig Dispense Refill   Accu-Chek Softclix Lancets lancets Use as instructed  100 each 12   acetaminophen  (TYLENOL ) 325 MG tablet Take 325 mg by mouth as needed for mild pain (pain score 1-3) or moderate pain (pain score 4-6).     acetaminophen -codeine  (TYLENOL  #3) 300-30 MG tablet Take 1 tablet by mouth every 6 (six) hours as needed for moderate pain (pain score 4-6). 10 tablet 0   aspirin  EC 81 MG tablet Take 81 mg by mouth every evening.      atorvastatin  (LIPITOR ) 80 MG tablet TAKE 1 TABLET BY MOUTH EVERY DAY IN THE EVENING 90 tablet 3   Calcium  Carbonate Antacid (TUMS PO) Take 2 tablets by mouth daily as needed (heartburn).     clopidogrel   (PLAVIX ) 75 MG tablet Take 1 tablet (75 mg total) by mouth daily. 90 tablet 3   ezetimibe  (ZETIA ) 10 MG tablet TAKE 1 TABLET BY MOUTH EVERY DAY 90 tablet 3   glipiZIDE  (GLUCOTROL  XL) 5 MG 24 hr tablet Take 1 tablet (5 mg total) by mouth daily with breakfast. 1 tablet 3   glucose blood (ACCU-CHEK GUIDE TEST) test strip Use to check blood sugar once daily. 100 each 12   ketoconazole  (NIZORAL ) 2 % cream Apply 1 Application topically 2 (two) times daily as needed for irritation. (Patient taking differently: Apply 1 Application topically as needed for irritation.) 60 g 1   lidocaine  (XYLOCAINE ) 5 % ointment Apply 1 Application topically as needed. 35.44 g 0   pantoprazole  (PROTONIX ) 40 MG tablet Take 1 tablet (40 mg total) by mouth daily as needed (heartburn). 90 tablet 2   triamcinolone  cream (KENALOG ) 0.1 % Apply 1 Application topically 2 (two) times daily. 30 g 0   No current facility-administered medications on file prior to visit.    BP 138/88 (BP Location: Left Arm, Patient Position: Sitting, Cuff Size: Large)   Pulse 66   Temp 97.8 F (36.6 C) (Oral)   Ht 5' 3 (1.6 m)   Wt 215 lb (97.5 kg)   SpO2 97%   BMI 38.09 kg/m   Objective:      Physical Exam GENERAL: Alert, cooperative, well developed, no acute distress. Obese HEAD: Normocephalic atraumatic. EYES: Extraocular movements intact BL, pupils round, equal and reactive to light BL, conjunctivae normal BL. EXTREMITIES: No cyanosis or edema. NEUROLOGICAL: Oriented to person, place and time, no gait abnormalities, moves all extremities without gross motor or sensory deficit.         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 No recent falls. Continues to use a cane for balance. No current neurologist follow-up post-hospitalization for most recent stroke which was in February 2025. MRI confirmed  patchy acute right frontoparietal infarcts punctate, acute right occipital infarct, moderate chronic small vessel ischemic disease. She was evaluated by neurology who recommended continuing dual antiplatelet therapy, statin.  Cardiology placed loop recorder, upcoming follow-up appointment with them on 12/5.  Sent referral to neurology as patient did not follow-up with them.  - Continue aspirin  81 mg daily and Plavix  75 mg daily  - Continue Lipitor  80 mg - Started losartan  25 mg with plans to gradually uptitrate for BP control, slowly tapering off of metoprolol  - Referred to neurology for follow-up. - Provided neurology contact information.  Type 2 diabetes mellitus Suboptimal control, A1c at 7.7%, goal given age and history of multiple strokes would be less than 7.  Plan to repeat at next visit glipizide  causing hypoglycemia. Discussed  switching to Mounjaro for better glycemic control and weight management. Explained potential side effects including gastrointestinal discomfort, pancreatitis, and vision changes.  Patient agreeable to start. Will consider initiating SGLT2 at next visit given comorbid CKD.  - Discontinued glipizide . - Initiated Mounjaro 2.5 mg weekly with plans to uptitrate - Started losartan  25 mg - Scheduled follow-up in one week for full diabetic visit.  Essential hypertension Current regimen of metoprolol .  Slightly above goal this visit at 138/88, given age and comorbid CKD, would aim for less than 130/80.  discussed switching to losartan  for better renal protection and blood pressure control. Explained potential side effects including cough and hypotension. Per chart review, patient did not tolerate lisinopril  with ankle swelling, attempting alternative ARB given no history of angioedema, no contraindication to attempt.  - Instructed to taper metoprolol  by taking half a tablet daily for one week, then discontinue. - Initiated losartan  25 mg daily starting tomorrow. -  Instructed to monitor blood pressure daily and record readings to bring to next visit. - Scheduled follow-up in one week for blood pressure evaluation.  Anxiety disorder At risk for medication side effect Intermittent anxiety attacks, particularly at night. Current use of Xanax  poses safety risks due to potential respiratory and cardiac depression. Discussed safer alternatives for anxiety management. Explained that Atarax  (hydroxyzine ) is a safer alternative, but may cause drowsiness and dizziness.  She is agreeable to try.  Anxiety is reportedly intermittent, but should symptoms become more persistent, will consider SSRI for better symptom control.  - Discontinued Xanax . - Initiated Atarax  25 mg as needed for anxiety, starting with half a tablet. - Educated on potential side effects of Atarax , including drowsiness and dizziness.     Return in about 1 week (around 01/13/2024) for Blood pressure, leg pain and full diabetic visit, repeat A1c at that time.   Hanford Lust K Joy Reiger, MD  01/06/24     Contains text generated by Abridge.

## 2024-01-06 NOTE — Telephone Encounter (Signed)
 Copied from CRM [id number]. Topic: Clinical - Medication Question >> Jan 06, 2024 11:21 AM Treva T wrote: Reason for CRM: Pt calling, requesting a return call to ask question regarding medication, metoprolol  succinate (TOPROL -XL) 50 MG 24 hr tablet.  Pt can be reached at [phone].  Pt is aware of same day call back.

## 2024-01-06 NOTE — Telephone Encounter (Signed)
 Spoke with pt. She is very confused about her medications. She was instructed to take half a tablet of metoprolol  daily for a week and then stop. Pt states that she is taking 25mg  daily but 50mg  is listed on her medication list. Her question is should she be taking 12.5mg  daily and losartan  at the same time?

## 2024-01-06 NOTE — Telephone Encounter (Signed)
 FYI Only or Action Required?: Action required by provider: clinical question for provider and needing dosage clarification.  Patient was last seen in primary care on 01/06/2024 by Bennett Reuben POUR, MD.  Called Nurse Triage reporting Medication Problem.  Triage Disposition: Call PCP Now  Patient/caregiver understands and will follow disposition?: Yes  Copied from CRM #[id number]. Topic: Clinical - Red Word Triage >> Jan 06, 2024  3:15 PM Jasmin G wrote: [facility] that prompted transfer to Nurse Triage: Pt requested to speak with NT regarding metoprolol  succinate (TOPROL -XL) 50 MG 24 hr tablet dosage, as she states that she's unsure if she was told to take half of a quarter. Reason for Disposition  [1] Caller has URGENT medicine question about med that primary care doctor (or NP/PA) or specialist prescribed AND [2] triager unable to answer question  Answer Assessment - Initial Assessment Questions Patient is needing clarification on the Metoprolol  dosage she should be taking tomorrow. Patient has been taking 25 mg tablet daily-she hasn't been taking 50 mg. Patient is asking if she should be splitting the 25 mg tablet in half and take a half starting tomorrow. Patient is needing a call back from the office.   1. NAME of MEDICINE: What medicine(s) are you calling about?     Metoprolol  (Toprol -XL)  2. QUESTION: What is your question? (e.g., double dose of medicine, side effect)     Patient has been taking 25 mg Metoprolol . Patient hasn't been taking 50 mg of Toprol -XL. Patient is asking if she should be taking 1/2 of the 25 mg tablet.  3. PRESCRIBER: Who prescribed the medicine? Reason: if prescribed by specialist, call should be referred to that group.     [place] 4. SYMPTOMS: Do you have any symptoms? If Yes, ask: What symptoms are you having?  How bad are the symptoms (e.g., mild, moderate, severe)     Not symptoms.  Protocols used: Medication Question Call-A-AH

## 2024-01-09 ENCOUNTER — Telehealth: Payer: Self-pay

## 2024-01-09 ENCOUNTER — Other Ambulatory Visit (HOSPITAL_COMMUNITY): Payer: Self-pay

## 2024-01-09 ENCOUNTER — Encounter

## 2024-01-09 NOTE — Telephone Encounter (Signed)
 Pharmacy Patient Advocate Encounter   Received notification from Pt Calls Messages that prior authorization for Ozempic 2 is required/requested.   Insurance verification completed.   The patient is insured through [facility].   Spoke with [facility]. Medication was filled 01/06/24. Patient copay is $297.17

## 2024-01-16 ENCOUNTER — Other Ambulatory Visit: Payer: Self-pay

## 2024-01-16 DIAGNOSIS — F419 Anxiety disorder, unspecified: Secondary | ICD-10-CM

## 2024-01-19 ENCOUNTER — Encounter

## 2024-01-20 ENCOUNTER — Ambulatory Visit: Attending: Cardiology

## 2024-01-20 DIAGNOSIS — I63419 Cerebral infarction due to embolism of unspecified middle cerebral artery: Secondary | ICD-10-CM

## 2024-01-20 LAB — CUP PACEART REMOTE DEVICE CHECK
Date Time Interrogation Session: 20251204234832
Implantable Pulse Generator Implant Date: 20250228

## 2024-01-24 ENCOUNTER — Ambulatory Visit

## 2024-01-24 NOTE — Progress Notes (Signed)
 Remote Loop Recorder Transmission

## 2024-01-26 ENCOUNTER — Ambulatory Visit: Payer: Self-pay | Admitting: Cardiology

## 2024-01-30 ENCOUNTER — Emergency Department

## 2024-01-30 ENCOUNTER — Other Ambulatory Visit: Payer: Self-pay

## 2024-01-30 ENCOUNTER — Emergency Department: Admission: EM | Admit: 2024-01-30 | Discharge: 2024-01-30 | Disposition: A | Discharge status: Home / Self Care

## 2024-01-30 ENCOUNTER — Ambulatory Visit: Payer: Self-pay

## 2024-01-30 ENCOUNTER — Encounter: Payer: Self-pay | Admitting: Emergency Medicine

## 2024-01-30 DIAGNOSIS — N939 Abnormal uterine and vaginal bleeding, unspecified: Secondary | ICD-10-CM

## 2024-01-30 DIAGNOSIS — I10 Essential (primary) hypertension: Secondary | ICD-10-CM

## 2024-01-30 DIAGNOSIS — I160 Hypertensive urgency: Secondary | ICD-10-CM

## 2024-01-30 DIAGNOSIS — R1033 Periumbilical pain: Secondary | ICD-10-CM | POA: Diagnosis not present

## 2024-01-30 LAB — CBC
HCT: 40.1 % (ref 36.0–46.0)
Hemoglobin: 13.3 g/dL (ref 12.0–15.0)
MCH: 30.4 pg (ref 26.0–34.0)
MCHC: 33.2 g/dL (ref 30.0–36.0)
MCV: 91.6 fL (ref 80.0–100.0)
Platelets: 225 K/uL (ref 150–400)
RBC: 4.38 MIL/uL (ref 3.87–5.11)
RDW: 12.2 % (ref 11.5–15.5)
WBC: 8.4 K/uL (ref 4.0–10.5)
nRBC: 0 % (ref 0.0–0.2)

## 2024-01-30 LAB — URINALYSIS, ROUTINE W REFLEX MICROSCOPIC
Bilirubin Urine: NEGATIVE
Glucose, UA: NEGATIVE mg/dL
Ketones, ur: NEGATIVE mg/dL
Nitrite: NEGATIVE
Protein, ur: NEGATIVE mg/dL
Specific Gravity, Urine: 1.021 (ref 1.005–1.030)
pH: 5 (ref 5.0–8.0)

## 2024-01-30 LAB — COMPREHENSIVE METABOLIC PANEL WITH GFR
ALT: 19 U/L (ref 0–44)
AST: 24 U/L (ref 15–41)
Albumin: 4.1 g/dL (ref 3.5–5.0)
Alkaline Phosphatase: 88 U/L (ref 38–126)
Anion gap: 11 (ref 5–15)
BUN: 19 mg/dL (ref 8–23)
CO2: 24 mmol/L (ref 22–32)
Calcium: 9.3 mg/dL (ref 8.9–10.3)
Chloride: 102 mmol/L (ref 98–111)
Creatinine, Ser: 1.08 mg/dL — ABNORMAL HIGH (ref 0.44–1.00)
GFR, Estimated: 56 mL/min — ABNORMAL LOW (ref >60)
Glucose, Bld: 144 mg/dL — ABNORMAL HIGH (ref 70–99)
Potassium: 4 mmol/L (ref 3.5–5.1)
Sodium: 138 mmol/L (ref 135–145)
Total Bilirubin: 0.3 mg/dL (ref 0.0–1.2)
Total Protein: 6.8 g/dL (ref 6.5–8.1)

## 2024-01-30 LAB — LIPASE, BLOOD: Lipase: 36 U/L (ref 11–51)

## 2024-01-30 NOTE — Telephone Encounter (Signed)
 Reason for Triage: Red Word that prompted transfer to Nurse Triage: Pt having vaginal bleeding or rectum, started on Saturday.Pt was waiting for 30 minutes and would like to be called on home # [phone]    Voice mailbox has not been set up, unable to leave a message.

## 2024-01-30 NOTE — ED Provider Notes (Signed)
 [facility] Provider Note    Event Date/Time   First MD Initiated Contact with Patient 01/30/24 2044     (approximate)   History   Vaginal Bleeding and Abdominal Pain   HPI  Tami Jones is a 68 y.o. female who presents with concern of vaginal bleeding.  Symptoms began about 3 days ago, developed excessive vaginal bleeding going through multiple pads a day, since the initial onset, bleeding has started to improve today used about 1 pad.  Endorses some accompanying periumbilical discomfort described as intermittent cramping pain.  No associated nausea vomiting fevers chills.  She states that she does sometimes feel not so well but her daughter was at bedside states that that is been going on for a long time and the patient agrees.  History of a C-section but no other abdominal surgical history.  She states she has not had any vaginal bleeding for many years now.  Called her primary doctor who recommended she stop taking her Plavix .  She currently is without a gynecologist as her old one is now out of network.      Physical Exam   Triage Vital Signs: ED Triage Vitals  Encounter Vitals Group     BP 01/30/24 1907 (!) 226/95     Girls Systolic BP Percentile --      Girls Diastolic BP Percentile --      Boys Systolic BP Percentile --      Boys Diastolic BP Percentile --      Pulse Rate 01/30/24 1907 79     Resp 01/30/24 1907 18     Temp 01/30/24 1907 98.6 F (37 C)     Temp Source 01/30/24 1907 Oral     SpO2 01/30/24 1907 100 %     Weight --      Height --      Head Circumference --      Peak Flow --      Pain Score 01/30/24 1905 6     Pain Loc --      Pain Education --      Exclude from Growth Chart --     Most recent vital signs: Vitals:   01/30/24 1907 01/30/24 2035  BP: (!) 226/95 (!) 188/94  Pulse: 79 85  Resp: 18 18  Temp: 98.6 F (37 C)   SpO2: 100% 100%     General: Awake, no distress.  CV:  Good peripheral perfusion.   Resp:  Normal effort.  Abd:  No distention.  Soft nontender GU:  Chaperoned by Fleeta Dominion RN, there is a peritoneal cyst appreciated externally, but external vaginal structure appears otherwise normal, on speculum exam, I do appreciate a polyp at about the 11:00 area of the cervix, there is dried blood noted in the cervical os, no active bleeding Other:     ED Results / Procedures / Treatments   Labs (all labs ordered are listed, but only abnormal results are displayed) Labs Reviewed  COMPREHENSIVE METABOLIC PANEL WITH GFR - Abnormal; Notable for the following components:      Result Value   Glucose, Bld 144 (*)    Creatinine, Ser 1.08 (*)    GFR, Estimated 56 (*)    All other components within normal limits  URINALYSIS, ROUTINE W REFLEX MICROSCOPIC - Abnormal; Notable for the following components:   Color, Urine YELLOW (*)    APPearance CLEAR (*)    Hgb urine dipstick MODERATE (*)    Leukocytes,Ua TRACE (*)  Bacteria, UA RARE (*)    All other components within normal limits  LIPASE, BLOOD  CBC     EKG     RADIOLOGY   PROCEDURES:  Critical Care performed: No  Procedures   MEDICATIONS ORDERED IN ED: Medications - No data to display   IMPRESSION / MDM / ASSESSMENT AND PLAN / ED COURSE  I reviewed the triage vital signs and the nursing notes.                               Patient's presentation is most consistent with acute complicated illness / injury requiring diagnostic workup.  68 year old female presenting today with concern of vaginal bleeding.  She appears well at rest she is not in any acute distress she is hypertensive here but remaining vitals are reassuring.  Given the symptomatology, I discussed concern for possible malignancy causing vaginal bleeding.  She is very confident that this bleeding was all vaginal and not rectal.  Given the presentation we will perform speculum exam as well as transvaginal ultrasound to further assess.  Hemoglobin  fortunately is at baseline so I believe reasonable to discharge if speculum exam and ultrasound are reassuring.   Clinical Course as of 01/30/24 2319  Mon Jan 30, 2024  2204 Speculum exam without evidence of active bleeding however cervical polyp appreciated.  Regardless awaiting results of ultrasound imaging and anticipate likely reasonable for discharge home afterwards. [SK]    Clinical Course User Index [SK] Fernand Rossie HERO, MD     FINAL CLINICAL IMPRESSION(S) / ED DIAGNOSES   Final diagnoses:  Vaginal bleeding     Rx / DC Orders   ED Discharge Orders     None        Note:  This document was prepared using Dragon voice recognition software and may include unintentional dictation errors.   Fernand Rossie HERO, MD 01/30/24 [phone]

## 2024-01-30 NOTE — ED Triage Notes (Signed)
 Pt arrives POV ambulatory to triage, gait steady, no acute distress noted c/o vaginal bleeding since Thursday, pt states the bleeding has decreased over weekend and is now very little, only using one pad today. Pt reports lower abd pain that started Saturday and has continued.

## 2024-01-30 NOTE — Discharge Instructions (Addendum)
 You were seen today due to concern of vaginal bleeding.  At this time your ultrasound imaging fortunately appears reassuring.  However I would urge you to follow-up with a gynecologist in the outpatient setting.  Bleeding at your age is very concerning for possible cancer.  I have provided contact information for local gynecologist to you may follow-up with for further assessment.  If you start having symptoms of lightheadedness, shortness of breath with exertion, increased abdominal pain, or any other symptoms you find concerning please return to the emergency department immediately for further medical management.  Your blood pressure was also very elevated today, please be sure to follow-up with your primary doctor to discuss further management of the blood pressure.  Continue taking your blood pressure medications.  If you start developing chest pain shortness of breath lightheadedness or any other symptoms you find concerning please return to the emergency department immediately for further assessment and evaluation.

## 2024-01-30 NOTE — Telephone Encounter (Signed)
 Already triaged in a separate triage encounter 01/30/24, closing this encounter due to this.

## 2024-01-30 NOTE — ED Notes (Signed)
 Pt reports taking metoprolol  for BP and was supposed to start losartan  but pt states she has not felt good over the past few weeks and when she is fearful to start medication due to bottoming out and being home alone. Pt states her family will be over for the holidays and plans to start new medication then so someone will be present.

## 2024-01-30 NOTE — Telephone Encounter (Signed)
 FYI Only or Action Required?: FYI only for provider: ED advised.  Patient was last seen in primary care on 01/06/2024 by Bennett Reuben POUR, MD.  Called Nurse Triage reporting Bleeding/Bruising and Dizziness.  Symptoms began several days ago.  Interventions attempted: Rest, hydration, or home remedies.  Symptoms are: gradually worsening.  Triage Disposition: Go to ED Now (or PCP Triage) (overriding See HCP Within 4 Hours (Or PCP Triage))  Patient/caregiver understands and will follow disposition?:       Copied from CRM #[id number]. Topic: Clinical - Red Word Triage >> Jan 30, 2024  9:55 AM Zebedee SAUNDERS wrote: Red Word that prompted transfer to Nurse Triage: Pt having vaginal bleeding or rectum, started on Saturday. >> Jan 30, 2024 11:15 AM Willma R wrote: Patient was waiting for NT and then line disconnected. Warm transferred to NT. >> Jan 30, 2024 10:46 AM Zebedee SAUNDERS wrote: After waiting for 30 minutes please call pt at [phone]. Many thanks! Reason for Disposition  [1] MODERATE dizziness (e.g., interferes with normal activities) AND [2] has NOT been evaluated by doctor (or NP/PA) for this  (Exception: Dizziness caused by heat exposure, sudden standing, or poor fluid intake.)  [1] Constant abdominal pain AND [2] present > 2 hours  Answer Assessment - Initial Assessment Questions Pt states on Saturday she began to have bleeding but is unsure if it was vaginal or rectally. As conversation continued it sounds more vaginally. States it was bright red blood like period (hasn't had a period in 17 years). States Saturday she went through 3 pads (was even bleeding without using restroom). States she would stand up and blood would drip on the floor. She states she did have some very small clots. States her bms were normal looking, didn't look like blood was mixed with them but would see blood and stool on toilet paper together. States she hasn't been feeling well for a couple of weeks,  lightheaded, abdominal pain, not her self. Dr. Bennett wanted her to start a new medication and cut her metoprolol  in half and has not done that yet. She didn't take her plavix  last night due the bleeding. She is also complaining of vaginal burning, even without urination. She checked BP 189/92. RN advised ER given no appts today but also because her symptoms, this dizziness, unsure if bleeding is rectally or vaginally, can see if pt is anemic, constant abdominal pain (which she described as a tooth ache down there), that she has stopped her plavix  and needs advise on that. Pt stated understanding. Rn advised message would be sent to Dr. Bennett to let her know about ER visit and also that she has not yet made the changes to her medications that Dr. Bennett advised. Pt stated understanding and was going to call dtr. If dtr can't take her, given her dizziness Rn recommended 911 or call us  back and we can assist with that. Pt stated understanding.    1. DESCRIPTION: Describe your dizziness.     Light headed 2. LIGHTHEADED: Do you feel lightheaded? (e.g., somewhat faint, woozy, weak upon standing)     Unable to describe 3. VERTIGO: Do you feel like either you or the room is spinning or tilting? (i.e., vertigo)     Unsure how to describe 4. SEVERITY: How bad is it?  Do you feel like you are going to faint? Can you stand and walk?     Pt states she can walk, has not passed out 5. ONSET:  When did the dizziness  begin?     States it's been ongoing, worse over the weekend.   6. HEART RATE: Can you tell me your heart rate? How many beats in 15 seconds?  (Note: Not all patients can do this.)       79 7. CAUSE: What do you think is causing the dizziness? (e.g., decreased fluids or food, diarrhea, emotional distress, heat exposure, new medicine, sudden standing, vomiting; unknown)     Denies n/v/d  8. OTHER SYMPTOMS: Do you have any other symptoms? (e.g., fever, chest pain, vomiting, diarrhea,  bleeding)       bleeding  Answer Assessment - Initial Assessment Questions 1. BLEEDING SEVERITY: Describe the bleeding that you are having. How much bleeding is there?      Moderate on Saturday, lessened Sunday, only with wiping today 2. ONSET: When did the bleeding begin? Is it continuing now?     Saturday- very light now 3. MENSTRUAL PERIOD: When was the last normal menstrual period? How is this different than your period?     17  years ago 4. REGULARITY: How regular are your periods?     none 5. ABDOMEN PAIN: Do you have any pain? How bad is the pain?  (e.g., Scale 0-10; none, mild, moderate, or severe)     6/10  9. BLOOD THINNER MEDICINES: Do you take any blood thinners? (e.g., Coumadin / warfarin, Pradaxa / dabigatran, aspirin )     plavix  10. CAUSE: What do you think is causing the bleeding? (e.g., recent gyn surgery, recent gyn procedure; known bleeding disorder, cervical cancer, polycystic ovarian disease, fibroids)         unknown 11. HEMODYNAMIC STATUS: Are you weak or feeling lightheaded? If Yes, ask: Can you stand and walk normally?        Light headed 12. OTHER SYMPTOMS: What other symptoms are you having with the bleeding? (e.g., passed tissue, vaginal discharge, fever, menstrual-type cramps)       Small clots  Protocols used: Vaginal Bleeding - Abnormal-A-AH, Dizziness - Lightheadedness-A-AH

## 2024-02-03 ENCOUNTER — Ambulatory Visit

## 2024-02-06 ENCOUNTER — Ambulatory Visit: Payer: Self-pay

## 2024-02-06 NOTE — Telephone Encounter (Signed)
 FYI Only or Action Required?: FYI only for provider: Office visit advised, patient states she needs to call back to schedule due to transportation.  Patient was last seen in primary care on 01/06/2024 by Bennett Reuben POUR, MD.  Called Nurse Triage reporting Vaginal Bleeding and Abdominal Pain.  Symptoms began a week ago.  Interventions attempted: Rest, hydration, or home remedies.  Symptoms are: unchanged.  Triage Disposition: See PCP When Office is Open (Within 3 Days) (overriding See HCP Within 4 Hours (Or PCP Triage))  Patient/caregiver understands and will follow disposition?: Yes  Reason for Disposition  [1] MILD-MODERATE pain AND [2] constant AND [3] present > 2 hours  Answer Assessment - Initial Assessment Questions Patient was seen in the ED on 01/30/24 for vaginal bleeding and had US  performed with no findings. Was advised to see GYN for follow up and possible biopsy. Patient has appt scheduled in January, but is calling to schedule follow up appt with PCP. She mentions abdominal pain which she reports as 6/10 pain. She states this is a constant pain, but also mentions that this was present during ER visit. Office visit advised during triage, but patient needs to call back to schedule due to transportation reasons. Advised to visit ED/UC if symptoms worsen.   1. LOCATION: Where does it hurt?      Lower abdomen  2. RADIATION: Does the pain shoot anywhere else? (e.g., chest, back)     No  3. ONSET: When did the pain begin? (e.g., minutes, hours or days ago)      About a week a go  4. SUDDEN: Gradual or sudden onset?     Unknown  5. PATTERN Does the pain come and go, or is it constant?     Constant  6. SEVERITY: How bad is the pain?  (e.g., Scale 1-10; mild, moderate, or severe)     6/10  7. RECURRENT SYMPTOM: Have you ever had this type of stomach pain before? If Yes, ask: When was the last time? and What happened that time?      Patient states this has  been ongoing with on/off vaginal bleeding since ER visit   8. CAUSE: What do you think is causing the stomach pain? (e.g., gallstones, recent abdominal surgery)     Unsure-something reproductive related  9. RELIEVING/AGGRAVATING FACTORS: What makes it better or worse? (e.g., antacids, bending or twisting motion, bowel movement)     Unknown  10. OTHER SYMPTOMS: Do you have any other symptoms? (e.g., back pain, diarrhea, fever, urination pain, vomiting)       Vaginal bleeding-had a small amount yesterday, none today  11. PREGNANCY: Is there any chance you are pregnant? When was your last menstrual period?       NA  Protocols used: Abdominal Pain - [facility]  Copied from CRM #[id number]. Topic: Clinical - Red Word Triage >> Feb 06, 2024 12:43 PM Aleatha BROCKS wrote: Red Word that prompted transfer to Nurse Triage: Vaginal bleeding yesterday

## 2024-02-13 ENCOUNTER — Encounter

## 2024-02-20 ENCOUNTER — Ambulatory Visit: Payer: Medicare (Managed Care) | Attending: Cardiology

## 2024-02-20 DIAGNOSIS — I63419 Cerebral infarction due to embolism of unspecified middle cerebral artery: Secondary | ICD-10-CM | POA: Diagnosis not present

## 2024-02-21 ENCOUNTER — Ambulatory Visit: Payer: Self-pay | Admitting: Cardiology

## 2024-02-21 LAB — CUP PACEART REMOTE DEVICE CHECK
Date Time Interrogation Session: 20260104234424
Implantable Pulse Generator Implant Date: 20250228

## 2024-02-24 NOTE — Progress Notes (Signed)
 Remote Loop Recorder Transmission

## 2024-03-02 ENCOUNTER — Encounter: Payer: Self-pay | Admitting: Emergency Medicine

## 2024-03-02 ENCOUNTER — Ambulatory Visit (INDEPENDENT_AMBULATORY_CARE_PROVIDER_SITE_OTHER): Payer: Medicare (Managed Care)

## 2024-03-02 ENCOUNTER — Telehealth: Payer: Self-pay | Admitting: Emergency Medicine

## 2024-03-02 ENCOUNTER — Ambulatory Visit
Admission: EM | Admit: 2024-03-02 | Discharge: 2024-03-02 | Disposition: A | Discharge status: Home / Self Care | Payer: Medicare (Managed Care)

## 2024-03-02 DIAGNOSIS — M25571 Pain in right ankle and joints of right foot: Secondary | ICD-10-CM

## 2024-03-02 DIAGNOSIS — M79671 Pain in right foot: Secondary | ICD-10-CM

## 2024-03-02 DIAGNOSIS — H9201 Otalgia, right ear: Secondary | ICD-10-CM | POA: Diagnosis not present

## 2024-03-02 MED ORDER — PREDNISONE 10 MG (21) PO TBPK: ORAL_TABLET | Freq: Every day | ORAL | 0 refills | Status: AC

## 2024-03-02 MED ORDER — ACETAMINOPHEN-CODEINE 300-30 MG PO TABS: 1.0000 | ORAL_TABLET | Freq: Four times a day (QID) | ORAL | 0 refills | Status: AC | PRN

## 2024-03-02 MED ORDER — IPRATROPIUM BROMIDE 0.03 % NA SOLN: 2.0000 | Freq: Two times a day (BID) | NASAL | 0 refills | Status: AC

## 2024-03-02 NOTE — Discharge Instructions (Signed)
 Today you are evaluated for your right sided ear pain, on exam no sign of infection and symptoms are most likely related to congestion  May use nasal spray twice daily to help clear out congestion from the sinuses to help reduce symptoms  You have been prescribed prednisone  to take as directed with food, this medicine will help with both the foot and ankle as well as the sinus cavity and ear pain  We recommend use of medicine such as Mucinex  or pseudoephedrine to further help reduce sinus congestion  X-ray of the foot and ankle are negative for injury, does show osteopenia to the foot  You have been given compression to be used for stability support, wear when completing activity  May elevate the foot whenever sitting and lying to help reduce swelling  May apply ice or heat over the affected area in 10 to 15-minute intervals  You may continue activity as tolerable  If symptoms continue to persist or worsen please get a follow-up ointment with orthopedics information listed in front page

## 2024-03-02 NOTE — ED Provider Notes (Addendum)
 " [name]    CSN: [id number] Arrival date & time: 03/02/24  0802      History   Chief Complaint Chief Complaint  Patient presents with   Fall   Ankle Pain   Foot Injury    HPI Tami Jones is a 69 y.o. female.    Patient presents for evaluation of nasal congestion, postnasal drip, sinus pressure and forehead and right sided ear pain beginning a day ago.  No known sick contacts, endorses influenza around Christmas but has fully resolved.  Has not attempted treatment.  Denies fever, cough.  Patient concerned with right ankle and foot pain beginning 1 day ago after tripping and falling over her dog.  Pain is endorsed to be to the medial and the lateral aspect, painful to bear weight and to complete all range of motion.  Has peripheral neuropathy, has slightly worsened from baseline.  Has attempted use of Tylenol  which has been ineffective.      Past Medical History:  Diagnosis Date   Anxiety    Asthma    CVA (cerebral vascular accident) (HCC) 03/2006   right occipital, Dr. 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   Radiculopathy of leg 01/25/2023   TIA (transient ischemic attack) 03/25/2022   Asthma, chronic, unspecified asthma severity, with acute exacerbation 03/25/2022   Morbid obesity (HCC) 12/28/2021   Seizure disorder (HCC) 12/28/2021   Anxiety    CKD (chronic kidney disease), stage IIIa    Neuropathic pain, arm 10/26/2018   Hemiparesis of right dominant side (HCC) 08/17/2018   Cerebrovascular disease, arteriosclerotic, post-stroke 07/17/2014   Right sided sciatica 03/08/2013   MENOPAUSAL SYNDROME 09/10/2008   Essential hypertension, benign 08/10/2006    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 EF 60%   CESAREAN SECTION     ESOPHAGOGASTRODUODENOSCOPY  05/2005   LOOP RECORDER INSERTION N/A 04/15/2023   Procedure: LOOP RECORDER INSERTION;  Surgeon: Inocencio Soyla Lunger, MD;  Location: [facility];  Service: Cardiovascular;  Laterality: N/A;   TONSILLECTOMY AND ADENOIDECTOMY      OB History     Gravida  2   Para  2   Term  2   Preterm  0   AB  0   Living  2      SAB  0   IAB  0   Ectopic  0   Multiple  0   Live Births               Home Medications    Prior to Admission medications  Medication Sig Start Date End Date Taking? Authorizing Provider  clotrimazole-betamethasone (LOTRISONE) cream Apply 1 Application topically 2 (two) times daily. 02/28/24  Yes [provider]  metroNIDAZOLE (METROGEL) 0.75 % vaginal gel Place 1 Applicatorful vaginally at bedtime. 03/01/24  Yes [provider]  Accu-Chek Softclix Lancets lancets Use as instructed 11/18/23   Webb, Padonda B, FNP  acetaminophen  (TYLENOL ) 325 MG tablet Take 325 mg by mouth as needed for mild  pain (pain score 1-3) or moderate pain (pain score 4-6).    [provider]  acetaminophen -codeine  (TYLENOL  #3) 300-30 MG tablet Take 1 tablet by mouth every 6 (six) hours as needed for moderate pain (pain score 4-6). 08/16/23   Teresa Shelba SAUNDERS, NP  aspirin  EC 81 MG tablet Take 81 mg by mouth every evening.     [provider]  atorvastatin  (LIPITOR ) 80 MG tablet TAKE 1 TABLET BY MOUTH EVERY DAY IN THE EVENING 10/10/23   Jimmy Charlie FERNS, MD  Calcium  Carbonate Antacid (TUMS PO) Take 2 tablets by mouth daily as needed (heartburn).    [provider]  clopidogrel  (PLAVIX ) 75 MG tablet Take 1 tablet (75 mg total) by mouth daily. 05/17/23    Jimmy Charlie FERNS, MD  ezetimibe  (ZETIA ) 10 MG tablet TAKE 1 TABLET BY MOUTH EVERY DAY 10/10/23   Jimmy Charlie FERNS, MD  glipiZIDE  (GLUCOTROL  XL) 5 MG 24 hr tablet Take 1 tablet (5 mg total) by mouth daily with breakfast. 05/05/23   Jimmy Charlie I, MD  glucose blood (ACCU-CHEK GUIDE TEST) test strip Use to check blood sugar once daily. 09/30/23   Jimmy Charlie FERNS, MD  ketoconazole  (NIZORAL ) 2 % cream Apply 1 Application topically 2 (two) times daily as needed for irritation. Patient taking differently: Apply 1 Application topically as needed for irritation. 12/09/22   Jimmy Charlie FERNS, MD  lidocaine  (XYLOCAINE ) 5 % ointment Apply 1 Application topically as needed. 10/03/22   Dicky Anes, MD  losartan  (COZAAR ) 25 MG tablet Take 1 tablet (25 mg total) by mouth daily. 01/06/24 04/05/24  Bowa, Nahosi K, MD  metoprolol  succinate (TOPROL -XL) 50 MG 24 hr tablet Take 1 tablet (50 mg total) by mouth daily. 01/06/24   Bowa, Nahosi K, MD  pantoprazole  (PROTONIX ) 40 MG tablet Take 1 tablet (40 mg total) by mouth daily as needed (heartburn). 04/27/23   Bedsole, Amy E, MD  triamcinolone  cream (KENALOG ) 0.1 % Apply 1 Application topically 2 (two) times daily. 08/16/23   Teresa Shelba SAUNDERS, NP    Family History Family History  Problem Relation Age of Onset   Coronary artery disease Mother    Diabetes Mellitus II Mother    Heart attack Mother 75   Diabetes Mellitus II Maternal Grandmother     Social History Social History[1]   Allergies   Citalopram , Doxycycline hyclate, Erythromycin, Montelukast sodium, Nitrofurantoin, Sulfa antibiotics, Tramadol hcl, Venlafaxine  hcl, Amoxicillin -pot clavulanate, Clarithromycin , Gabapentin , and Lisinopril    Review of Systems Review of Systems   Physical Exam Triage Vital Signs ED Triage Vitals  Encounter Vitals Group     BP 03/02/24 0820 (!) 162/79     Girls Systolic BP Percentile --      Girls Diastolic BP Percentile --      Boys Systolic BP Percentile --       Boys Diastolic BP Percentile --      Pulse Rate 03/02/24 0820 74     Resp 03/02/24 0820 20     Temp 03/02/24 0820 98 F (36.7 C)     Temp Source 03/02/24 0820 Oral     SpO2 03/02/24 0820 98 %     Weight --      Height --      Head Circumference --      Peak Flow --      Pain Score 03/02/24 0825 9     Pain Loc --      Pain Education --      Exclude  from Growth Chart --    No data found.  Updated Vital Signs BP (!) 162/79 (BP Location: Right Arm)   Pulse 74   Temp 98 F (36.7 C) (Oral)   Resp 20   SpO2 98%   Visual Acuity Right Eye Distance:   Left Eye Distance:   Bilateral Distance:    Right Eye Near:   Left Eye Near:    Bilateral Near:     Physical Exam Constitutional:      Appearance: Normal appearance.  HENT:     Head: Normocephalic.     Right Ear: Tympanic membrane, ear canal and external ear normal.     Left Ear: Tympanic membrane, ear canal and external ear normal.     Nose: Congestion present.     Mouth/Throat:     Pharynx: No oropharyngeal exudate or posterior oropharyngeal erythema.  Eyes:     Extraocular Movements: Extraocular movements intact.  Pulmonary:     Effort: Pulmonary effort is normal.  Musculoskeletal:     Comments: Tenderness present to the medial lateral aspect of the right ankle without point tenderness, generalized swelling noted, no ecchymosis or deformity, able to bear weight and complete range of motion but painful with all movement, 2+ dorsalis pedis pulse, tenderness present to the medial aspect of the right midfoot without ecchymosis or deformity, mild swelling noted, 2+ pedal pulse  Neurological:     Mental Status: She is alert and oriented to person, place, and time. Mental status is at baseline.      UC Treatments / Results  Labs (all labs ordered are listed, but only abnormal results are displayed) Labs Reviewed - No data to display  EKG   Radiology No results found.  Procedures Procedures (including critical care  time)  Medications Ordered in UC Medications - No data to display  Initial Impression / Assessment and Plan / UC Course  I have reviewed the triage vital signs and the nursing notes.  Pertinent labs & imaging results that were available during my care of the patient were reviewed by me and considered in my medical decision making (see chart for details).  acute right foot pain, acute right ankle pain, right ear otalgia  X-ray of the foot and ankle negative, discussed with patient prior to discharge, prescribed prednisone  and Tylenol  3, PDMP reviewed low risk, recommended supportive care and given compression while in clinic to be used as needed walker referral given to Ortho if no improvement seen  No abnormality to the ear indicating infection, also experiencing congestion and sinus pressure most likely cause of ear pain, discussed this with patient, prednisone  prescribed as well as Atrovent  nasal spray, recommended additional supportive care with follow-up as needed Final Clinical Impressions(s) / UC Diagnoses   Final diagnoses:  None   Discharge Instructions   None    ED Prescriptions   None    PDMP not reviewed this encounter.    Teresa Shelba SAUNDERS, NP 03/02/24 1213     [1]  Social History Tobacco Use   Smoking status: Never    Passive exposure: Never   Smokeless tobacco: Never  Vaping Use   Vaping status: Never Used  Substance Use Topics   Alcohol use: No    Alcohol/week: 0.0 standard drinks of alcohol    Comment: wine occassionally   Drug use: No     Teresa Shelba SAUNDERS, NP 03/02/24 1837  "

## 2024-03-02 NOTE — ED Triage Notes (Signed)
 Patient reports that she fell yesterday in her house. Patient states she tripped over her dog. Patient complains of pain and swelling in right foot, and  right ankle that radiates up her leg. Patient also complains of left ear pain. Patient took Tylenol  last for pain with no relief. Rates pain 9/10.

## 2024-03-02 NOTE — Telephone Encounter (Signed)
 Forgot to send medicine during appointment

## 2024-03-13 ENCOUNTER — Encounter: Admitting: Obstetrics

## 2024-03-16 ENCOUNTER — Telehealth: Payer: Self-pay | Admitting: Podiatry

## 2024-03-16 ENCOUNTER — Telehealth: Payer: Self-pay

## 2024-03-16 NOTE — Telephone Encounter (Signed)
 Attempted to contact Tami Jones to set up a New Patient appt with Dr. Tobie but I could not leave a VM; the mailbox was not set up to rcv messages.

## 2024-03-22 ENCOUNTER — Ambulatory Visit: Payer: Self-pay

## 2024-03-23 ENCOUNTER — Other Ambulatory Visit: Payer: Self-pay | Admitting: Family Medicine

## 2024-04-04 ENCOUNTER — Ambulatory Visit: Admitting: Neurology

## 2024-04-22 ENCOUNTER — Ambulatory Visit: Payer: Medicare (Managed Care)

## 2024-05-23 ENCOUNTER — Ambulatory Visit: Payer: Medicare (Managed Care)

## 2024-06-23 ENCOUNTER — Ambulatory Visit: Payer: Medicare (Managed Care)

## 2024-07-24 ENCOUNTER — Ambulatory Visit: Payer: Medicare (Managed Care)

## 2024-08-24 ENCOUNTER — Ambulatory Visit: Payer: Medicare (Managed Care)

## 2024-09-24 ENCOUNTER — Ambulatory Visit: Payer: Medicare (Managed Care)

## 2024-10-25 ENCOUNTER — Ambulatory Visit: Payer: Medicare (Managed Care)

## 2024-11-25 ENCOUNTER — Ambulatory Visit: Payer: Medicare (Managed Care)

## 2024-12-26 ENCOUNTER — Ambulatory Visit: Payer: Medicare (Managed Care)

## 2025-01-26 ENCOUNTER — Ambulatory Visit: Payer: Medicare (Managed Care)

## 2025-02-26 ENCOUNTER — Ambulatory Visit: Payer: Medicare (Managed Care)
